# Patient Record
Sex: Female | Born: 1935 | Race: Black or African American | Hispanic: No | Smoking: Never smoker
Health system: Southern US, Community
[De-identification: ages and names within clinical notes are randomized; demographics above are authoritative.]

## PROBLEM LIST (undated history)

## (undated) DIAGNOSIS — E785 Hyperlipidemia, unspecified: Secondary | ICD-10-CM

## (undated) DIAGNOSIS — R229 Localized swelling, mass and lump, unspecified: Secondary | ICD-10-CM

## (undated) DIAGNOSIS — I5042 Chronic combined systolic (congestive) and diastolic (congestive) heart failure: Secondary | ICD-10-CM

## (undated) DIAGNOSIS — D649 Anemia, unspecified: Secondary | ICD-10-CM

## (undated) DIAGNOSIS — I2510 Atherosclerotic heart disease of native coronary artery without angina pectoris: Secondary | ICD-10-CM

## (undated) DIAGNOSIS — Z9289 Personal history of other medical treatment: Secondary | ICD-10-CM

## (undated) DIAGNOSIS — K279 Peptic ulcer, site unspecified, unspecified as acute or chronic, without hemorrhage or perforation: Secondary | ICD-10-CM

## (undated) DIAGNOSIS — E46 Unspecified protein-calorie malnutrition: Secondary | ICD-10-CM

## (undated) DIAGNOSIS — I10 Essential (primary) hypertension: Secondary | ICD-10-CM

## (undated) DIAGNOSIS — I5032 Chronic diastolic (congestive) heart failure: Principal | ICD-10-CM

## (undated) DIAGNOSIS — K219 Gastro-esophageal reflux disease without esophagitis: Secondary | ICD-10-CM

## (undated) DIAGNOSIS — B379 Candidiasis, unspecified: Secondary | ICD-10-CM

## (undated) DIAGNOSIS — I495 Sick sinus syndrome: Secondary | ICD-10-CM

## (undated) DIAGNOSIS — R001 Bradycardia, unspecified: Secondary | ICD-10-CM

## (undated) DIAGNOSIS — E119 Type 2 diabetes mellitus without complications: Secondary | ICD-10-CM

## (undated) DIAGNOSIS — J90 Pleural effusion, not elsewhere classified: Secondary | ICD-10-CM

## (undated) DIAGNOSIS — N186 End stage renal disease: Secondary | ICD-10-CM

## (undated) DIAGNOSIS — J45909 Unspecified asthma, uncomplicated: Secondary | ICD-10-CM

## (undated) DIAGNOSIS — I340 Nonrheumatic mitral (valve) insufficiency: Secondary | ICD-10-CM

## (undated) HISTORY — DX: Atherosclerotic heart disease of native coronary artery without angina pectoris: I25.10

## (undated) HISTORY — DX: Sick sinus syndrome: I49.5

## (undated) HISTORY — DX: Chronic combined systolic (congestive) and diastolic (congestive) heart failure: I50.42

## (undated) HISTORY — DX: Personal history of other medical treatment: Z92.89

## (undated) HISTORY — DX: Gastro-esophageal reflux disease without esophagitis: K21.9

## (undated) HISTORY — PX: COLON SURGERY: SHX602

## (undated) HISTORY — DX: Nonrheumatic mitral (valve) insufficiency: I34.0

## (undated) HISTORY — PX: ABDOMINAL HYSTERECTOMY: SHX81

## (undated) HISTORY — DX: Unspecified protein-calorie malnutrition: E46

## (undated) HISTORY — DX: Candidiasis, unspecified: B37.9

## (undated) HISTORY — DX: Anemia, unspecified: D64.9

## (undated) HISTORY — DX: Chronic diastolic (congestive) heart failure: I50.32

## (undated) HISTORY — DX: Pleural effusion, not elsewhere classified: J90

## (undated) HISTORY — DX: End stage renal disease: N18.6

## (undated) HISTORY — DX: Bradycardia, unspecified: R00.1

---

## 2008-09-19 ENCOUNTER — Emergency Department (HOSPITAL_BASED_OUTPATIENT_CLINIC_OR_DEPARTMENT_OTHER): Admission: EM | Admit: 2008-09-19 | Discharge: 2008-09-20 | Payer: Self-pay | Admitting: Emergency Medicine

## 2008-09-19 ENCOUNTER — Ambulatory Visit: Payer: Self-pay | Admitting: Diagnostic Radiology

## 2009-06-29 ENCOUNTER — Emergency Department (HOSPITAL_BASED_OUTPATIENT_CLINIC_OR_DEPARTMENT_OTHER): Admission: EM | Admit: 2009-06-29 | Discharge: 2009-06-29 | Payer: Self-pay | Admitting: Emergency Medicine

## 2009-06-29 ENCOUNTER — Ambulatory Visit: Payer: Self-pay | Admitting: Diagnostic Radiology

## 2010-10-07 LAB — DIFFERENTIAL
Basophils Absolute: 0 10*3/uL (ref 0.0–0.1)
Eosinophils Relative: 1 % (ref 0–5)
Lymphocytes Relative: 30 % (ref 12–46)
Lymphs Abs: 2.2 10*3/uL (ref 0.7–4.0)
Monocytes Absolute: 0.5 10*3/uL (ref 0.1–1.0)
Monocytes Relative: 7 % (ref 3–12)
Neutro Abs: 4.4 10*3/uL (ref 1.7–7.7)

## 2010-10-07 LAB — CBC
HCT: 35.4 % — ABNORMAL LOW (ref 36.0–46.0)
Hemoglobin: 11.8 g/dL — ABNORMAL LOW (ref 12.0–15.0)
RBC: 3.98 MIL/uL (ref 3.87–5.11)
WBC: 7.2 10*3/uL (ref 4.0–10.5)

## 2010-10-07 LAB — BASIC METABOLIC PANEL
BUN: 15 mg/dL (ref 6–23)
Chloride: 101 mEq/L (ref 96–112)
GFR calc non Af Amer: 54 mL/min — ABNORMAL LOW (ref >60)
Potassium: 3.5 mEq/L (ref 3.5–5.1)
Sodium: 142 mEq/L (ref 135–145)

## 2010-10-07 LAB — GLUCOSE, CAPILLARY: Glucose-Capillary: 198 mg/dL — ABNORMAL HIGH (ref 70–99)

## 2010-10-17 LAB — URINALYSIS, ROUTINE W REFLEX MICROSCOPIC
Bilirubin Urine: NEGATIVE
Glucose, UA: NEGATIVE mg/dL
Ketones, ur: NEGATIVE mg/dL
Nitrite: NEGATIVE
Specific Gravity, Urine: 1.01 (ref 1.005–1.030)
pH: 6.5 (ref 5.0–8.0)

## 2010-10-17 LAB — URINE CULTURE: Colony Count: 2000

## 2010-10-17 LAB — DIFFERENTIAL
Basophils Absolute: 0 10*3/uL (ref 0.0–0.1)
Lymphocytes Relative: 37 % (ref 12–46)
Lymphs Abs: 2.5 10*3/uL (ref 0.7–4.0)
Neutro Abs: 3.9 10*3/uL (ref 1.7–7.7)

## 2010-10-17 LAB — BASIC METABOLIC PANEL
BUN: 22 mg/dL (ref 6–23)
Chloride: 100 mEq/L (ref 96–112)
Creatinine, Ser: 0.9 mg/dL (ref 0.4–1.2)
GFR calc non Af Amer: >60 mL/min (ref >60)
Potassium: 4.6 mEq/L (ref 3.5–5.1)

## 2010-10-17 LAB — URINE MICROSCOPIC-ADD ON

## 2010-10-17 LAB — CBC
Platelets: 224 10*3/uL (ref 150–400)
RDW: 14 % (ref 11.5–15.5)
WBC: 6.8 10*3/uL (ref 4.0–10.5)

## 2012-07-09 ENCOUNTER — Emergency Department: Payer: Self-pay | Admitting: Emergency Medicine

## 2012-07-09 LAB — URINALYSIS, COMPLETE
Nitrite: NEGATIVE
Protein: 30
RBC,UR: 1 /HPF (ref 0–5)
Specific Gravity: 1.01 (ref 1.003–1.030)
Squamous Epithelial: 2

## 2012-07-09 LAB — COMPREHENSIVE METABOLIC PANEL
Albumin: 3.5 g/dL (ref 3.4–5.0)
Alkaline Phosphatase: 126 U/L (ref 50–136)
Bilirubin,Total: 0.2 mg/dL (ref 0.2–1.0)
Chloride: 98 mmol/L (ref 98–107)
EGFR (Non-African Amer.): 23 — ABNORMAL LOW
Osmolality: 298 (ref 275–301)
SGOT(AST): 19 U/L (ref 15–37)

## 2012-07-09 LAB — CBC
HCT: 37.5 % (ref 35.0–47.0)
WBC: 7.4 10*3/uL (ref 3.6–11.0)

## 2012-11-22 ENCOUNTER — Encounter (HOSPITAL_BASED_OUTPATIENT_CLINIC_OR_DEPARTMENT_OTHER): Payer: Self-pay

## 2012-11-22 ENCOUNTER — Emergency Department (HOSPITAL_BASED_OUTPATIENT_CLINIC_OR_DEPARTMENT_OTHER): Payer: Medicare Other

## 2012-11-22 ENCOUNTER — Emergency Department (HOSPITAL_BASED_OUTPATIENT_CLINIC_OR_DEPARTMENT_OTHER)
Admission: EM | Admit: 2012-11-22 | Discharge: 2012-11-22 | Disposition: A | Discharge status: Home / Self Care | Payer: Medicare Other | Attending: Emergency Medicine | Admitting: Emergency Medicine

## 2012-11-22 DIAGNOSIS — K219 Gastro-esophageal reflux disease without esophagitis: Secondary | ICD-10-CM | POA: Insufficient documentation

## 2012-11-22 DIAGNOSIS — E785 Hyperlipidemia, unspecified: Secondary | ICD-10-CM | POA: Insufficient documentation

## 2012-11-22 DIAGNOSIS — I10 Essential (primary) hypertension: Secondary | ICD-10-CM | POA: Insufficient documentation

## 2012-11-22 DIAGNOSIS — Z8719 Personal history of other diseases of the digestive system: Secondary | ICD-10-CM | POA: Insufficient documentation

## 2012-11-22 DIAGNOSIS — R059 Cough, unspecified: Secondary | ICD-10-CM | POA: Insufficient documentation

## 2012-11-22 DIAGNOSIS — R05 Cough: Secondary | ICD-10-CM | POA: Insufficient documentation

## 2012-11-22 DIAGNOSIS — R072 Precordial pain: Secondary | ICD-10-CM | POA: Insufficient documentation

## 2012-11-22 DIAGNOSIS — E119 Type 2 diabetes mellitus without complications: Secondary | ICD-10-CM | POA: Insufficient documentation

## 2012-11-22 DIAGNOSIS — J45909 Unspecified asthma, uncomplicated: Secondary | ICD-10-CM | POA: Insufficient documentation

## 2012-11-22 DIAGNOSIS — Z87448 Personal history of other diseases of urinary system: Secondary | ICD-10-CM | POA: Insufficient documentation

## 2012-11-22 DIAGNOSIS — Z79899 Other long term (current) drug therapy: Secondary | ICD-10-CM | POA: Insufficient documentation

## 2012-11-22 DIAGNOSIS — R5381 Other malaise: Secondary | ICD-10-CM | POA: Insufficient documentation

## 2012-11-22 HISTORY — DX: Unspecified asthma, uncomplicated: J45.909

## 2012-11-22 HISTORY — DX: Peptic ulcer, site unspecified, unspecified as acute or chronic, without hemorrhage or perforation: K27.9

## 2012-11-22 HISTORY — DX: Hyperlipidemia, unspecified: E78.5

## 2012-11-22 HISTORY — DX: Localized swelling, mass and lump, unspecified: R22.9

## 2012-11-22 LAB — COMPREHENSIVE METABOLIC PANEL
Albumin: 3.7 g/dL (ref 3.5–5.2)
BUN: 36 mg/dL — ABNORMAL HIGH (ref 6–23)
Creatinine, Ser: 1.7 mg/dL — ABNORMAL HIGH (ref 0.50–1.10)
Potassium: 4 mEq/L (ref 3.5–5.1)
Total Protein: 7.4 g/dL (ref 6.0–8.3)

## 2012-11-22 LAB — URINALYSIS, ROUTINE W REFLEX MICROSCOPIC
Glucose, UA: NEGATIVE mg/dL
pH: 5.5 (ref 5.0–8.0)

## 2012-11-22 LAB — CBC WITH DIFFERENTIAL/PLATELET
Eosinophils Relative: 4 % (ref 0–5)
HCT: 28.6 % — ABNORMAL LOW (ref 36.0–46.0)
Hemoglobin: 9.5 g/dL — ABNORMAL LOW (ref 12.0–15.0)
Lymphocytes Relative: 34 % (ref 12–46)
Lymphs Abs: 2.9 10*3/uL (ref 0.7–4.0)
MCH: 29.5 pg (ref 26.0–34.0)
MCV: 88.8 fL (ref 78.0–100.0)
Monocytes Absolute: 0.6 10*3/uL (ref 0.1–1.0)
Monocytes Relative: 7 % (ref 3–12)
Platelets: 221 10*3/uL (ref 150–400)
RBC: 3.22 MIL/uL — ABNORMAL LOW (ref 3.87–5.11)
WBC: 8.6 10*3/uL (ref 4.0–10.5)

## 2012-11-22 LAB — APTT: aPTT: 21 seconds — ABNORMAL LOW (ref 24–37)

## 2012-11-22 LAB — BASIC METABOLIC PANEL
BUN: 35 mg/dL — ABNORMAL HIGH (ref 6–23)
CO2: 25 mEq/L (ref 19–32)
Calcium: 9.4 mg/dL (ref 8.4–10.5)
Glucose, Bld: 255 mg/dL — ABNORMAL HIGH (ref 70–99)
Sodium: 137 mEq/L (ref 135–145)

## 2012-11-22 LAB — PROTIME-INR
INR: 0.92 (ref 0.00–1.49)
Prothrombin Time: 12.3 seconds (ref 11.6–15.2)

## 2012-11-22 LAB — LIPASE, BLOOD: Lipase: 68 U/L — ABNORMAL HIGH (ref 11–59)

## 2012-11-22 NOTE — ED Notes (Signed)
Pt states that she was hospitalized at [facility] in the past week, was discharged yesterday, was given two units of PRBC there for bleeding ulcers.  Pt states that last night she had onset of chest pain, dull pressure in central chest.  Pt states that pain is constant, denies diaphroesis, sob, + for cough which is occasionally productive.  HX of asthma.  C/o generalized weakness.

## 2012-11-22 NOTE — Discharge Instructions (Signed)
Monica [name], you had physical examination, laboratory tests, EKG, and chest x-ray to check on you for chest pain that started last night. Fortunately, all of your tests were good.  It is safe to go home. Continue to take Protonix once a day to reduce the amount of acid in your stomach, and rest with the head propped up, so acid from your stomach does not go up into your chest.

## 2012-11-22 NOTE — ED Notes (Signed)
MD at bedside. 

## 2012-11-22 NOTE — ED Provider Notes (Signed)
History  This chart was scribed for Monica Latina III, MD by Mikel Cella, ED Scribe. This patient was seen in room MH12/MH12 and the patient's care was started at 1711.  CSN: [id number]  Arrival date & time 11/22/12  1554   None     Chief Complaint  Patient presents with  . Chest Pain  . Weakness  . Shortness of Breath     The history is provided by the patient. No language interpreter was used.    HPI Comments: Monica [name] is a 79 y.o. female with a h/o HTN, DM and renal disorder who presents to the Emergency Department complaining of gradual onset, unchanging, constant CP with associated generalized weakness and intermittently productive cough that began last night. Pt states she was hospitalized at [facility] for bleeding ulcers. She states she was discharged yesterday. She describes the pain as a dull pressure located in her central chest. She reports taking an allergy pill with intermittent relief. She denies any nausea, emesis, diarrhea, diaphoresis, SOB and fever as associated symptoms.    Past Medical History  Diagnosis Date  . Peptic ulcer   . Mass   . Hypertension   . Hyperlipidemia   . Diabetes mellitus without complication   . Asthma   . Blood transfusion without reported diagnosis   . Renal disorder     Past Surgical History  Procedure Laterality Date  . Abdominal hysterectomy      History reviewed. No pertinent family history.  History  Substance Use Topics  . Smoking status: Never Smoker   . Smokeless tobacco: Never Used  . Alcohol Use: No   No OB history available.   Review of Systems  Constitutional: Negative for diaphoresis.  Respiratory: Positive for cough. Negative for shortness of breath.   Cardiovascular: Positive for chest pain.  Neurological: Positive for weakness.       Generalized weakness  All other systems reviewed and are negative.    Allergies  Review of patient's allergies indicates no known allergies.  Home Medications    Current Outpatient Rx  Name  Route  Sig  Dispense  Refill  . amLODipine (NORVASC) 10 MG tablet   Oral   Take 10 mg by mouth daily.         Marland Kitchen atenolol-chlorthalidone (TENORETIC) 50-25 MG per tablet   Oral   Take 1 tablet by mouth daily.         Marland Kitchen atorvastatin (LIPITOR) 80 MG tablet   Oral   Take 80 mg by mouth daily.         . diphenhydrAMINE (BENADRYL) 25 MG tablet   Oral   Take 25 mg by mouth 2 (two) times daily.         Marland Kitchen glimepiride (AMARYL) 4 MG tablet   Oral   Take 8 mg by mouth daily before breakfast.         . IRON CR PO   Oral   Take 65 mg by mouth 2 (two) times daily.         Marland Kitchen losartan (COZAAR) 50 MG tablet   Oral   Take 50 mg by mouth daily.         . pantoprazole (PROTONIX) 20 MG tablet   Oral   Take 40 mg by mouth daily.         . rosiglitazone (AVANDIA) 8 MG tablet   Oral   Take 8 mg by mouth daily.           Triage  Vitals: BP 187/62  Pulse 66  Temp(Src) 98.5 F (36.9 C) (Oral)  Resp 20  Ht 5\' 2"  (1.575 m)  Wt 182 lb (82.555 kg)  BMI 33.28 kg/m2  SpO2 99%  Physical Exam  Nursing note and vitals reviewed. Constitutional: She is oriented to person, place, and time. She appears well-developed and well-nourished. No distress.  HENT:  Head: Normocephalic and atraumatic.  Right Ear: External ear normal.  Left Ear: External ear normal.  Mouth/Throat: Oropharynx is clear and moist. No oropharyngeal exudate.  TMs normal bilaterally  Eyes: Conjunctivae and EOM are normal. Pupils are equal, round, and reactive to light.  Neck: Normal range of motion. Neck supple. No tracheal deviation present.  Cardiovascular: Normal rate, regular rhythm and normal heart sounds.  Exam reveals no gallop and no friction rub.   No murmur heard. Pulmonary/Chest: Effort normal and breath sounds normal. No respiratory distress. She has no wheezes. She has no rales. She exhibits no tenderness.  Abdominal: Soft. Bowel sounds are normal. She exhibits no  distension and no mass. There is no tenderness. There is no rebound and no guarding.  Localizes pain to substernal region. No deformity or tenderness noted   Musculoskeletal: Normal range of motion. She exhibits no edema.  Neurological: She is alert and oriented to person, place, and time.  Skin: Skin is warm and dry. She is not diaphoretic.  Psychiatric: She has a normal mood and affect. Her behavior is normal.    ED Course  Procedures (including critical care time)  DIAGNOSTIC STUDIES: Oxygen Saturation is 99% on room air, normal by my interpretation.    COORDINATION OF CARE:  5:11 PM  Date: 11/22/2012  Rate: 67  Rhythm: normal sinus rhythm  QRS Axis: normal  Intervals: normal  ST/T Wave abnormalities: normal  Conduction Disutrbances:none  Narrative Interpretation: Normal EKG  Old EKG Reviewed: none available and unchanged  5:38 PM-Discussed treatment plan which includes CBC, BMP, Troponin, EKG and CXRwith pt at bedside and pt agreed to plan.   6:45 PM: Pt rechecked, lab results and discharge discussed, she seems normal and comfortable    Results for orders placed during the hospital encounter of 11/22/12  CBC WITH DIFFERENTIAL      Result Value Range   WBC 8.6  4.0 - 10.5 K/uL   RBC 3.22 (*) 3.87 - 5.11 MIL/uL   Hemoglobin 9.5 (*) 12.0 - 15.0 g/dL   HCT 28.6 (*) 36.0 - 46.0 %   MCV 88.8  78.0 - 100.0 fL   MCH 29.5  26.0 - 34.0 pg   MCHC 33.2  30.0 - 36.0 g/dL   RDW 13.8  11.5 - 15.5 %   Platelets 221  150 - 400 K/uL   Neutrophils Relative % 55  43 - 77 %   Neutro Abs 4.8  1.7 - 7.7 K/uL   Lymphocytes Relative 34  12 - 46 %   Lymphs Abs 2.9  0.7 - 4.0 K/uL   Monocytes Relative 7  3 - 12 %   Monocytes Absolute 0.6  0.1 - 1.0 K/uL   Eosinophils Relative 4  0 - 5 %   Eosinophils Absolute 0.4  0.0 - 0.7 K/uL   Basophils Relative 0  0 - 1 %   Basophils Absolute 0.0  0.0 - 0.1 K/uL  BASIC METABOLIC PANEL      Result Value Range   Sodium 137  135 - 145 mEq/L    Potassium 4.0  3.5 - 5.1 mEq/L  Chloride 99  96 - 112 mEq/L   CO2 25  19 - 32 mEq/L   Glucose, Bld 255 (*) 70 - 99 mg/dL   BUN 35 (*) 6 - 23 mg/dL   Creatinine, Ser 1.70 (*) 0.50 - 1.10 mg/dL   Calcium 9.4  8.4 - 10.5 mg/dL   GFR calc non Af Amer 27 (*) >90 mL/min   GFR calc Af Amer 32 (*) >90 mL/min  TROPONIN I      Result Value Range   Troponin I <0.30  <0.30 ng/mL  COMPREHENSIVE METABOLIC PANEL      Result Value Range   Sodium 137  135 - 145 mEq/L   Potassium 4.0  3.5 - 5.1 mEq/L   Chloride 99  96 - 112 mEq/L   CO2 23  19 - 32 mEq/L   Glucose, Bld 261 (*) 70 - 99 mg/dL   BUN 36 (*) 6 - 23 mg/dL   Creatinine, Ser 1.70 (*) 0.50 - 1.10 mg/dL   Calcium 9.5  8.4 - 10.5 mg/dL   Total Protein 7.4  6.0 - 8.3 g/dL   Albumin 3.7  3.5 - 5.2 g/dL   AST 22  0 - 37 U/L   ALT 20  0 - 35 U/L   Alkaline Phosphatase 58  39 - 117 U/L   Total Bilirubin 0.2 (*) 0.3 - 1.2 mg/dL   GFR calc non Af Amer 27 (*) >90 mL/min   GFR calc Af Amer 32 (*) >90 mL/min  URINALYSIS, ROUTINE W REFLEX MICROSCOPIC      Result Value Range   Color, Urine YELLOW  YELLOW   APPearance CLEAR  CLEAR   Specific Gravity, Urine 1.013  1.005 - 1.030   pH 5.5  5.0 - 8.0   Glucose, UA NEGATIVE  NEGATIVE mg/dL   Hgb urine dipstick NEGATIVE  NEGATIVE   Bilirubin Urine NEGATIVE  NEGATIVE   Ketones, ur NEGATIVE  NEGATIVE mg/dL   Protein, ur NEGATIVE  NEGATIVE mg/dL   Urobilinogen, UA 0.2  0.0 - 1.0 mg/dL   Nitrite NEGATIVE  NEGATIVE   Leukocytes, UA TRACE (*) NEGATIVE  PROTIME-INR      Result Value Range   Prothrombin Time 12.3  11.6 - 15.2 seconds   INR 0.92  0.00 - 1.49  APTT      Result Value Range   aPTT 21 (*) 24 - 37 seconds  LIPASE, BLOOD      Result Value Range   Lipase 68 (*) 11 - 59 U/L  URINE MICROSCOPIC-ADD ON      Result Value Range   Squamous Epithelial / LPF RARE  RARE   WBC, UA 0-2  <3 WBC/hpf   RBC / HPF 0-2  <3 RBC/hpf   Bacteria, UA FEW (*) RARE   Urine-Other MUCOUS PRESENT     Dg Chest 2  View  11/22/2012   *RADIOLOGY REPORT*  Clinical Data: Chest pain  CHEST - 2 VIEW  Comparison: June 29, 2009.  Findings: Stable cardiomediastinal silhouette.  No acute pulmonary disease is noted.  Moderate thoracic kyphosis is noted.  No pleural effusion or pneumothorax is noted.  IMPRESSION: No acute cardiopulmonary abnormality seen.   Original Report Authenticated By: Marijo Conception.,  M.D.      1. GERD (gastroesophageal reflux disease)      I personally performed the services described in this documentation, which was scribed in my presence. The recorded information has been reviewed and is accurate.  Katy Apo, MD  Monica Latina III, MD 11/22/12 [phone]

## 2014-07-25 ENCOUNTER — Ambulatory Visit (INDEPENDENT_AMBULATORY_CARE_PROVIDER_SITE_OTHER): Payer: Medicare HMO | Admitting: Podiatry

## 2014-07-25 ENCOUNTER — Encounter: Payer: Self-pay | Admitting: Podiatry

## 2014-07-25 VITALS — BP 150/71 | HR 77 | Ht 62.0 in | Wt 191.0 lb

## 2014-07-25 DIAGNOSIS — M7740 Metatarsalgia, unspecified foot: Secondary | ICD-10-CM | POA: Insufficient documentation

## 2014-07-25 DIAGNOSIS — M216X9 Other acquired deformities of unspecified foot: Secondary | ICD-10-CM | POA: Insufficient documentation

## 2014-07-25 DIAGNOSIS — B351 Tinea unguium: Secondary | ICD-10-CM

## 2014-07-25 DIAGNOSIS — M79606 Pain in leg, unspecified: Secondary | ICD-10-CM | POA: Insufficient documentation

## 2014-07-25 DIAGNOSIS — M7741 Metatarsalgia, right foot: Secondary | ICD-10-CM

## 2014-07-25 DIAGNOSIS — M7742 Metatarsalgia, left foot: Secondary | ICD-10-CM

## 2014-07-25 NOTE — Patient Instructions (Signed)
Seen for pain in both feet. Noted of tight achilles tendon bilateral. Need to do stretch exercise daily.  May benefit from Diabetic shoes

## 2014-07-25 NOTE — Progress Notes (Signed)
Subjective: 81 year old diabetic patient presents complaining of pain in side of both feet for off and on about 6 months. Hurts after been on feet. Her last diabetic shoes were made in 2012. Blood sugar was 180 this morning.   Objective: Hallux valgus with bunion bilateral. Pedal pulses are not palpable bilateral both PT and DP. Tight Achilles tendon bilateral. Bilateral ankle edema. No abnormal skin lesions.  Assessment: Lesser metatarsalgia bilateral. Forefoot varus bilateral. Ankle equinus bilateral. Lateral weight shifting bilateral.  Plan: Reviewed findings and stretch exercise. All nails debrided. May benefit from Diabetic shoes.

## 2015-04-24 DIAGNOSIS — E1165 Type 2 diabetes mellitus with hyperglycemia: Secondary | ICD-10-CM | POA: Insufficient documentation

## 2015-04-24 DIAGNOSIS — I10 Essential (primary) hypertension: Secondary | ICD-10-CM | POA: Insufficient documentation

## 2015-04-24 DIAGNOSIS — N186 End stage renal disease: Secondary | ICD-10-CM | POA: Insufficient documentation

## 2015-04-27 DIAGNOSIS — E1169 Type 2 diabetes mellitus with other specified complication: Secondary | ICD-10-CM | POA: Insufficient documentation

## 2015-04-27 DIAGNOSIS — E559 Vitamin D deficiency, unspecified: Secondary | ICD-10-CM | POA: Insufficient documentation

## 2015-04-27 DIAGNOSIS — E785 Hyperlipidemia, unspecified: Secondary | ICD-10-CM

## 2015-04-27 DIAGNOSIS — J45909 Unspecified asthma, uncomplicated: Secondary | ICD-10-CM | POA: Diagnosis present

## 2015-07-18 DIAGNOSIS — E1169 Type 2 diabetes mellitus with other specified complication: Secondary | ICD-10-CM | POA: Diagnosis not present

## 2015-07-18 DIAGNOSIS — Z6833 Body mass index (BMI) 33.0-33.9, adult: Secondary | ICD-10-CM | POA: Diagnosis not present

## 2015-07-18 DIAGNOSIS — Z794 Long term (current) use of insulin: Secondary | ICD-10-CM | POA: Diagnosis not present

## 2015-07-18 DIAGNOSIS — E1165 Type 2 diabetes mellitus with hyperglycemia: Secondary | ICD-10-CM | POA: Diagnosis not present

## 2015-07-18 DIAGNOSIS — E785 Hyperlipidemia, unspecified: Secondary | ICD-10-CM | POA: Diagnosis not present

## 2015-07-31 DIAGNOSIS — H401111 Primary open-angle glaucoma, right eye, mild stage: Secondary | ICD-10-CM | POA: Diagnosis not present

## 2015-07-31 DIAGNOSIS — H401122 Primary open-angle glaucoma, left eye, moderate stage: Secondary | ICD-10-CM | POA: Diagnosis not present

## 2015-08-02 DIAGNOSIS — E66811 Obesity, class 1: Secondary | ICD-10-CM | POA: Insufficient documentation

## 2015-08-02 DIAGNOSIS — E785 Hyperlipidemia, unspecified: Secondary | ICD-10-CM | POA: Diagnosis not present

## 2015-08-02 DIAGNOSIS — E1165 Type 2 diabetes mellitus with hyperglycemia: Secondary | ICD-10-CM | POA: Diagnosis not present

## 2015-08-02 DIAGNOSIS — E669 Obesity, unspecified: Secondary | ICD-10-CM | POA: Insufficient documentation

## 2015-08-02 DIAGNOSIS — E559 Vitamin D deficiency, unspecified: Secondary | ICD-10-CM | POA: Diagnosis not present

## 2015-08-02 DIAGNOSIS — Z794 Long term (current) use of insulin: Secondary | ICD-10-CM | POA: Diagnosis not present

## 2015-08-02 DIAGNOSIS — E1169 Type 2 diabetes mellitus with other specified complication: Secondary | ICD-10-CM | POA: Diagnosis not present

## 2015-08-02 DIAGNOSIS — I10 Essential (primary) hypertension: Secondary | ICD-10-CM | POA: Diagnosis not present

## 2015-08-02 DIAGNOSIS — N184 Chronic kidney disease, stage 4 (severe): Secondary | ICD-10-CM | POA: Diagnosis not present

## 2015-08-08 DIAGNOSIS — E1165 Type 2 diabetes mellitus with hyperglycemia: Secondary | ICD-10-CM | POA: Diagnosis not present

## 2015-08-08 DIAGNOSIS — N184 Chronic kidney disease, stage 4 (severe): Secondary | ICD-10-CM | POA: Diagnosis not present

## 2015-08-08 DIAGNOSIS — Z6834 Body mass index (BMI) 34.0-34.9, adult: Secondary | ICD-10-CM | POA: Diagnosis not present

## 2015-08-14 DIAGNOSIS — H401122 Primary open-angle glaucoma, left eye, moderate stage: Secondary | ICD-10-CM | POA: Diagnosis not present

## 2015-08-14 DIAGNOSIS — H401111 Primary open-angle glaucoma, right eye, mild stage: Secondary | ICD-10-CM | POA: Diagnosis not present

## 2015-08-20 DIAGNOSIS — J45909 Unspecified asthma, uncomplicated: Secondary | ICD-10-CM | POA: Diagnosis not present

## 2015-08-20 DIAGNOSIS — R1013 Epigastric pain: Secondary | ICD-10-CM | POA: Diagnosis not present

## 2015-08-20 DIAGNOSIS — Z0000 Encounter for general adult medical examination without abnormal findings: Secondary | ICD-10-CM | POA: Diagnosis not present

## 2015-08-20 DIAGNOSIS — K254 Chronic or unspecified gastric ulcer with hemorrhage: Secondary | ICD-10-CM | POA: Diagnosis not present

## 2015-08-20 DIAGNOSIS — E1151 Type 2 diabetes mellitus with diabetic peripheral angiopathy without gangrene: Secondary | ICD-10-CM | POA: Diagnosis not present

## 2015-08-20 DIAGNOSIS — E559 Vitamin D deficiency, unspecified: Secondary | ICD-10-CM | POA: Diagnosis not present

## 2015-08-20 DIAGNOSIS — N183 Chronic kidney disease, stage 3 (moderate): Secondary | ICD-10-CM | POA: Diagnosis not present

## 2015-08-20 DIAGNOSIS — I119 Hypertensive heart disease without heart failure: Secondary | ICD-10-CM | POA: Diagnosis not present

## 2015-08-20 DIAGNOSIS — E785 Hyperlipidemia, unspecified: Secondary | ICD-10-CM | POA: Diagnosis not present

## 2015-08-20 DIAGNOSIS — I10 Essential (primary) hypertension: Secondary | ICD-10-CM | POA: Diagnosis not present

## 2015-09-14 ENCOUNTER — Emergency Department (HOSPITAL_BASED_OUTPATIENT_CLINIC_OR_DEPARTMENT_OTHER): Payer: Medicare HMO

## 2015-09-14 ENCOUNTER — Encounter (HOSPITAL_BASED_OUTPATIENT_CLINIC_OR_DEPARTMENT_OTHER): Payer: Self-pay | Admitting: *Deleted

## 2015-09-14 ENCOUNTER — Inpatient Hospital Stay (HOSPITAL_BASED_OUTPATIENT_CLINIC_OR_DEPARTMENT_OTHER)
Admission: EM | Admit: 2015-09-14 | Discharge: 2015-10-03 | DRG: 233 | Disposition: A | Discharge status: Skilled Nursing Facility | Payer: Medicare HMO | Attending: Cardiothoracic Surgery | Admitting: Cardiothoracic Surgery

## 2015-09-14 DIAGNOSIS — E1129 Type 2 diabetes mellitus with other diabetic kidney complication: Secondary | ICD-10-CM | POA: Diagnosis not present

## 2015-09-14 DIAGNOSIS — J4520 Mild intermittent asthma, uncomplicated: Secondary | ICD-10-CM

## 2015-09-14 DIAGNOSIS — E785 Hyperlipidemia, unspecified: Secondary | ICD-10-CM | POA: Diagnosis present

## 2015-09-14 DIAGNOSIS — R52 Pain, unspecified: Secondary | ICD-10-CM | POA: Diagnosis not present

## 2015-09-14 DIAGNOSIS — R488 Other symbolic dysfunctions: Secondary | ICD-10-CM | POA: Diagnosis not present

## 2015-09-14 DIAGNOSIS — E669 Obesity, unspecified: Secondary | ICD-10-CM | POA: Diagnosis present

## 2015-09-14 DIAGNOSIS — E43 Unspecified severe protein-calorie malnutrition: Secondary | ICD-10-CM | POA: Diagnosis not present

## 2015-09-14 DIAGNOSIS — Z87891 Personal history of nicotine dependence: Secondary | ICD-10-CM

## 2015-09-14 DIAGNOSIS — I5032 Chronic diastolic (congestive) heart failure: Secondary | ICD-10-CM | POA: Diagnosis present

## 2015-09-14 DIAGNOSIS — I472 Ventricular tachycardia: Secondary | ICD-10-CM | POA: Diagnosis present

## 2015-09-14 DIAGNOSIS — N179 Acute kidney failure, unspecified: Secondary | ICD-10-CM | POA: Diagnosis not present

## 2015-09-14 DIAGNOSIS — I509 Heart failure, unspecified: Secondary | ICD-10-CM | POA: Diagnosis not present

## 2015-09-14 DIAGNOSIS — Z6834 Body mass index (BMI) 34.0-34.9, adult: Secondary | ICD-10-CM

## 2015-09-14 DIAGNOSIS — Z9689 Presence of other specified functional implants: Secondary | ICD-10-CM

## 2015-09-14 DIAGNOSIS — M109 Gout, unspecified: Secondary | ICD-10-CM | POA: Diagnosis present

## 2015-09-14 DIAGNOSIS — Z4659 Encounter for fitting and adjustment of other gastrointestinal appliance and device: Secondary | ICD-10-CM

## 2015-09-14 DIAGNOSIS — Z8711 Personal history of peptic ulcer disease: Secondary | ICD-10-CM

## 2015-09-14 DIAGNOSIS — Z794 Long term (current) use of insulin: Secondary | ICD-10-CM | POA: Diagnosis not present

## 2015-09-14 DIAGNOSIS — E119 Type 2 diabetes mellitus without complications: Secondary | ICD-10-CM

## 2015-09-14 DIAGNOSIS — I10 Essential (primary) hypertension: Secondary | ICD-10-CM

## 2015-09-14 DIAGNOSIS — N183 Chronic kidney disease, stage 3 unspecified: Secondary | ICD-10-CM

## 2015-09-14 DIAGNOSIS — I25110 Atherosclerotic heart disease of native coronary artery with unstable angina pectoris: Secondary | ICD-10-CM | POA: Diagnosis present

## 2015-09-14 DIAGNOSIS — R410 Disorientation, unspecified: Secondary | ICD-10-CM | POA: Diagnosis not present

## 2015-09-14 DIAGNOSIS — I209 Angina pectoris, unspecified: Secondary | ICD-10-CM | POA: Insufficient documentation

## 2015-09-14 DIAGNOSIS — J45909 Unspecified asthma, uncomplicated: Secondary | ICD-10-CM | POA: Diagnosis present

## 2015-09-14 DIAGNOSIS — I200 Unstable angina: Secondary | ICD-10-CM | POA: Diagnosis present

## 2015-09-14 DIAGNOSIS — I129 Hypertensive chronic kidney disease with stage 1 through stage 4 chronic kidney disease, or unspecified chronic kidney disease: Secondary | ICD-10-CM | POA: Diagnosis not present

## 2015-09-14 DIAGNOSIS — N184 Chronic kidney disease, stage 4 (severe): Secondary | ICD-10-CM | POA: Diagnosis present

## 2015-09-14 DIAGNOSIS — R4182 Altered mental status, unspecified: Secondary | ICD-10-CM | POA: Insufficient documentation

## 2015-09-14 DIAGNOSIS — J9811 Atelectasis: Secondary | ICD-10-CM | POA: Diagnosis not present

## 2015-09-14 DIAGNOSIS — Z951 Presence of aortocoronary bypass graft: Secondary | ICD-10-CM

## 2015-09-14 DIAGNOSIS — Z8673 Personal history of transient ischemic attack (TIA), and cerebral infarction without residual deficits: Secondary | ICD-10-CM

## 2015-09-14 DIAGNOSIS — I160 Hypertensive urgency: Secondary | ICD-10-CM | POA: Diagnosis present

## 2015-09-14 DIAGNOSIS — R1312 Dysphagia, oropharyngeal phase: Secondary | ICD-10-CM | POA: Diagnosis not present

## 2015-09-14 DIAGNOSIS — Z452 Encounter for adjustment and management of vascular access device: Secondary | ICD-10-CM | POA: Diagnosis not present

## 2015-09-14 DIAGNOSIS — M6281 Muscle weakness (generalized): Secondary | ICD-10-CM | POA: Diagnosis not present

## 2015-09-14 DIAGNOSIS — G9341 Metabolic encephalopathy: Secondary | ICD-10-CM | POA: Diagnosis not present

## 2015-09-14 DIAGNOSIS — I130 Hypertensive heart and chronic kidney disease with heart failure and stage 1 through stage 4 chronic kidney disease, or unspecified chronic kidney disease: Secondary | ICD-10-CM | POA: Diagnosis present

## 2015-09-14 DIAGNOSIS — D62 Acute posthemorrhagic anemia: Secondary | ICD-10-CM | POA: Diagnosis not present

## 2015-09-14 DIAGNOSIS — R9431 Abnormal electrocardiogram [ECG] [EKG]: Secondary | ICD-10-CM | POA: Diagnosis not present

## 2015-09-14 DIAGNOSIS — J90 Pleural effusion, not elsewhere classified: Secondary | ICD-10-CM

## 2015-09-14 DIAGNOSIS — K219 Gastro-esophageal reflux disease without esophagitis: Secondary | ICD-10-CM | POA: Diagnosis present

## 2015-09-14 DIAGNOSIS — E870 Hyperosmolality and hypernatremia: Secondary | ICD-10-CM | POA: Diagnosis not present

## 2015-09-14 DIAGNOSIS — N170 Acute kidney failure with tubular necrosis: Secondary | ICD-10-CM | POA: Diagnosis not present

## 2015-09-14 DIAGNOSIS — E1122 Type 2 diabetes mellitus with diabetic chronic kidney disease: Secondary | ICD-10-CM | POA: Diagnosis present

## 2015-09-14 DIAGNOSIS — R0602 Shortness of breath: Secondary | ICD-10-CM | POA: Diagnosis not present

## 2015-09-14 DIAGNOSIS — R262 Difficulty in walking, not elsewhere classified: Secondary | ICD-10-CM | POA: Diagnosis not present

## 2015-09-14 DIAGNOSIS — I080 Rheumatic disorders of both mitral and aortic valves: Secondary | ICD-10-CM | POA: Diagnosis not present

## 2015-09-14 DIAGNOSIS — I2510 Atherosclerotic heart disease of native coronary artery without angina pectoris: Secondary | ICD-10-CM | POA: Diagnosis not present

## 2015-09-14 DIAGNOSIS — R079 Chest pain, unspecified: Secondary | ICD-10-CM | POA: Diagnosis present

## 2015-09-14 DIAGNOSIS — Z09 Encounter for follow-up examination after completed treatment for conditions other than malignant neoplasm: Secondary | ICD-10-CM

## 2015-09-14 DIAGNOSIS — J939 Pneumothorax, unspecified: Secondary | ICD-10-CM | POA: Diagnosis not present

## 2015-09-14 DIAGNOSIS — R339 Retention of urine, unspecified: Secondary | ICD-10-CM | POA: Diagnosis not present

## 2015-09-14 DIAGNOSIS — Z48812 Encounter for surgical aftercare following surgery on the circulatory system: Secondary | ICD-10-CM | POA: Diagnosis not present

## 2015-09-14 DIAGNOSIS — Z4682 Encounter for fitting and adjustment of non-vascular catheter: Secondary | ICD-10-CM | POA: Diagnosis not present

## 2015-09-14 DIAGNOSIS — J811 Chronic pulmonary edema: Secondary | ICD-10-CM | POA: Diagnosis not present

## 2015-09-14 DIAGNOSIS — I639 Cerebral infarction, unspecified: Secondary | ICD-10-CM

## 2015-09-14 DIAGNOSIS — M40204 Unspecified kyphosis, thoracic region: Secondary | ICD-10-CM | POA: Diagnosis present

## 2015-09-14 HISTORY — DX: Essential (primary) hypertension: I10

## 2015-09-14 HISTORY — DX: Personal history of other medical treatment: Z92.89

## 2015-09-14 HISTORY — DX: Type 2 diabetes mellitus without complications: E11.9

## 2015-09-14 LAB — URINE MICROSCOPIC-ADD ON

## 2015-09-14 LAB — BASIC METABOLIC PANEL
Anion gap: 12 (ref 5–15)
BUN: 33 mg/dL — AB (ref 6–20)
CHLORIDE: 101 mmol/L (ref 101–111)
CO2: 25 mmol/L (ref 22–32)
Calcium: 8.9 mg/dL (ref 8.9–10.3)
Creatinine, Ser: 1.55 mg/dL — ABNORMAL HIGH (ref 0.44–1.00)
GFR calc Af Amer: 35 mL/min — ABNORMAL LOW (ref >60)
GFR calc non Af Amer: 30 mL/min — ABNORMAL LOW (ref >60)
GLUCOSE: 180 mg/dL — AB (ref 65–99)
POTASSIUM: 3.8 mmol/L (ref 3.5–5.1)
Sodium: 138 mmol/L (ref 135–145)

## 2015-09-14 LAB — URINALYSIS, ROUTINE W REFLEX MICROSCOPIC
BILIRUBIN URINE: NEGATIVE
Glucose, UA: NEGATIVE mg/dL
Ketones, ur: NEGATIVE mg/dL
Leukocytes, UA: NEGATIVE
Nitrite: NEGATIVE
PH: 6.5 (ref 5.0–8.0)
Protein, ur: 100 mg/dL — AB
SPECIFIC GRAVITY, URINE: 1.007 (ref 1.005–1.030)

## 2015-09-14 LAB — CBC
HEMATOCRIT: 34 % — AB (ref 36.0–46.0)
Hemoglobin: 11.1 g/dL — ABNORMAL LOW (ref 12.0–15.0)
MCH: 28.8 pg (ref 26.0–34.0)
MCHC: 32.6 g/dL (ref 30.0–36.0)
MCV: 88.1 fL (ref 78.0–100.0)
Platelets: 239 10*3/uL (ref 150–400)
RBC: 3.86 MIL/uL — ABNORMAL LOW (ref 3.87–5.11)
RDW: 14.4 % (ref 11.5–15.5)
WBC: 8.4 10*3/uL (ref 4.0–10.5)

## 2015-09-14 LAB — TROPONIN I: Troponin I: <0.03 ng/mL (ref <0.031)

## 2015-09-14 MED ORDER — ASPIRIN 81 MG PO CHEW
162.0000 mg | CHEWABLE_TABLET | Freq: Once | ORAL | Status: AC
  Administered 2015-09-14: 162 mg via ORAL
  Filled 2015-09-14: qty 2

## 2015-09-14 MED ORDER — NITROGLYCERIN 2 % TD OINT
1.0000 [in_us] | TOPICAL_OINTMENT | Freq: Once | TRANSDERMAL | Status: AC
  Administered 2015-09-14: 1 [in_us] via TOPICAL
  Filled 2015-09-14: qty 1

## 2015-09-14 NOTE — ED Notes (Signed)
Pt reports stabbing chest pain approx 15 mins prior arrival. States she felt hot and pain comes and goes. Has had prior episodes of the same

## 2015-09-14 NOTE — ED Provider Notes (Signed)
Medical screening examination/treatment/procedure(s) were conducted as a shared visit with non-physician practitioner(s) and myself.  I personally evaluated the patient during the encounter.   EKG Interpretation   Date/Time:  Friday September 14 2015 21:35:43 EST Ventricular Rate:  93 PR Interval:  186 QRS Duration: 96 QT Interval:  374 QTC Calculation: 465 R Axis:   11 Text Interpretation:  Normal sinus rhythm Nonspecific ST and T wave  abnormality , new since last tracing Abnormal ECG Confirmed by Cory Rama   MD-J, Kessler Kopinski ([id number]) on 09/14/2015 9:41:08 PM      Pt presents to the ED with chest pain.  Non known history of ACS.  Heart score =6.  Pain free in the ED.  BP elevated.  Will give asa, NTG.  Will admit for serial enzymes, further evaluation.  Dorie Rank, MD 09/14/15 2352

## 2015-09-14 NOTE — ED Provider Notes (Signed)
CSN: [id number]     Arrival date & time 09/14/15  2128 History   First MD Initiated Contact with Patient 09/14/15 2144     Chief Complaint  Patient presents with  . Chest Pain     (Consider location/radiation/quality/duration/timing/severity/associated sxs/prior Treatment) HPI  Monica [name] is a(n) 82 y.o. female who presents To the emergency department with chief complaint of chest pain. 2. The past medical history peptic ulcer disease, hypertension, hyperlipidemia and diabetes. Her father died of sudden cardiac arrest at the age of 89 from suspected MI. No other family history. Patient states that she's had about a week of intermittent retrosternal stabbing, severe chest pain which lasts about 20 minutes at a time. Today she was with her daughters walking into the store. Her daughter stated that by the time she walked from the car to the store. She was bent over, clutching her chest, complaining of severe stabbing chest pain. Her daughter stated that she was short of breath. She did take her albuterol inhaler without relief. However, her symptoms resolved at rest. Her daughters had taken here to the emergency department. She denies any history of any cardiac disease. She has no pain at rest at this time. She took 1 baby aspirin this morning. She does have a history of reflux and peptic ulcer, but states that this feels different. Her daughter states that that does not appear to be an asthma attack. Is no current chest pain or shortness of breath.    Past Medical History  Diagnosis Date  . Peptic ulcer   . Mass   . Hypertension   . Hyperlipidemia   . Diabetes mellitus without complication ([place])   . Asthma   . Blood transfusion without reported diagnosis   . Renal disorder    Past Surgical History  Procedure Laterality Date  . Abdominal hysterectomy    . Colon surgery     No family history on file. Social History  Substance Use Topics  . Smoking status: Never Smoker   . Smokeless  tobacco: Former Systems developer    Types: Snuff  . Alcohol Use: No   OB History    No data available     Review of Systems   Ten systems reviewed and are negative for acute change, except as noted in the HPI.   Allergies  Review of patient's allergies indicates no known allergies.  Home Medications   Prior to Admission medications   Medication Sig Start Date End Date Taking? Authorizing Provider  amLODipine (NORVASC) 10 MG tablet Take 10 mg by mouth daily.   Yes Historical Provider, MD  atorvastatin (LIPITOR) 80 MG tablet Take 80 mg by mouth daily.   Yes Historical Provider, MD  diphenhydrAMINE (BENADRYL) 25 MG tablet Take 25 mg by mouth as needed.    Yes Historical Provider, MD  furosemide (LASIX) 20 MG tablet  05/17/14  Yes Historical Provider, MD  insulin glargine (LANTUS) 100 UNIT/ML injection Inject 100 Units into the skin 2 (two) times daily.   Yes Historical Provider, MD  insulin lispro (HUMALOG) 100 UNIT/ML cartridge Inject into the skin 3 (three) times daily between meals as needed.   Yes Historical Provider, MD  IRON CR PO Take 65 mg by mouth 2 (two) times daily.   Yes Historical Provider, MD  allopurinol (ZYLOPRIM) 100 MG tablet  07/09/14   Historical Provider, MD  atenolol-chlorthalidone (TENORETIC) 50-25 MG per tablet Take 1 tablet by mouth daily.    Historical Provider, MD  glimepiride (AMARYL) 4  MG tablet Take 8 mg by mouth daily before breakfast.    Historical Provider, MD  losartan (COZAAR) 50 MG tablet Take 50 mg by mouth daily.    Historical Provider, MD  meclizine (ANTIVERT) 12.5 MG tablet Take 12.5 mg by mouth 3 (three) times daily as needed for dizziness.    Historical Provider, MD  pantoprazole (PROTONIX) 20 MG tablet Take 40 mg by mouth daily.    Historical Provider, MD  rosiglitazone (AVANDIA) 8 MG tablet Take 8 mg by mouth daily.    Historical Provider, MD   BP 182/89 mmHg  Pulse 90  Temp(Src) 98.5 F (36.9 C) (Oral)  Resp 18  Ht 5\' 2"  (1.575 m)  Wt 83.915 kg   BMI 33.83 kg/m2  SpO2 98% Physical Exam  Constitutional: She is oriented to person, place, and time. She appears well-developed and well-nourished. No distress.  HENT:  Head: Normocephalic and atraumatic.  Eyes: Conjunctivae are normal. No scleral icterus.  Neck: Normal range of motion.  Cardiovascular: Normal rate, regular rhythm and normal heart sounds.  Exam reveals no gallop and no friction rub.   No murmur heard. Pulmonary/Chest: Effort normal and breath sounds normal. No respiratory distress.  Abdominal: Soft. Bowel sounds are normal. She exhibits no distension and no mass. There is no tenderness. There is no guarding.  Neurological: She is alert and oriented to person, place, and time.  Skin: Skin is warm and dry. She is not diaphoretic.  Nursing note and vitals reviewed.   ED Course  Procedures (including critical care time) Labs Review Labs Reviewed  BASIC METABOLIC PANEL  CBC  TROPONIN I    Imaging Review No results found. I have personally reviewed and evaluated these images and lab results as part of my medical decision-making.   EKG Interpretation   Date/Time:  Friday September 14 2015 21:35:43 EST Ventricular Rate:  93 PR Interval:  186 QRS Duration: 96 QT Interval:  374 QTC Calculation: 465 R Axis:   11 Text Interpretation:  Normal sinus rhythm Nonspecific ST and T wave  abnormality , new since last tracing Abnormal ECG Confirmed by KNAPP   MD-J, JON [id number]) on 09/14/2015 9:41:08 PM      MDM   Final diagnoses:  Chest pain, unspecified chest pain type    10:28 PM BP 182/89 mmHg  Pulse 90  Temp(Src) 98.5 F (36.9 C) (Oral)  Resp 18  Ht 5\' 2"  (1.575 m)  Wt 83.915 kg  BMI 33.83 kg/m2  SpO2 98% 82 y/o Female with EKG changes.  She appears to have some new St depression in the lateral leads. I have ordered 162 asa to complete a full dose.  Feel she will need  CP r/o.  Patient seen in shared visit with attending physician.   12:18 AM BP 190/77  mmHg  Pulse 94  Temp(Src) 98.5 F (36.9 C) (Oral)  Resp 15  Ht 5\' 2"  (1.575 m)  Wt 83.915 kg  BMI 33.83 kg/m2  SpO2 98% Heart score is 6. Currently cp free.  12:46 AM Patient accepted for obs admission for CP r/o.   Margarita Mail, PA-C 09/15/15 [phone]

## 2015-09-15 ENCOUNTER — Encounter (HOSPITAL_BASED_OUTPATIENT_CLINIC_OR_DEPARTMENT_OTHER): Payer: Self-pay | Admitting: Internal Medicine

## 2015-09-15 DIAGNOSIS — K219 Gastro-esophageal reflux disease without esophagitis: Secondary | ICD-10-CM

## 2015-09-15 DIAGNOSIS — I10 Essential (primary) hypertension: Secondary | ICD-10-CM | POA: Diagnosis not present

## 2015-09-15 DIAGNOSIS — E119 Type 2 diabetes mellitus without complications: Secondary | ICD-10-CM

## 2015-09-15 DIAGNOSIS — N183 Chronic kidney disease, stage 3 unspecified: Secondary | ICD-10-CM | POA: Diagnosis present

## 2015-09-15 DIAGNOSIS — J4520 Mild intermittent asthma, uncomplicated: Secondary | ICD-10-CM

## 2015-09-15 DIAGNOSIS — E1129 Type 2 diabetes mellitus with other diabetic kidney complication: Secondary | ICD-10-CM | POA: Diagnosis present

## 2015-09-15 DIAGNOSIS — R079 Chest pain, unspecified: Secondary | ICD-10-CM | POA: Diagnosis not present

## 2015-09-15 DIAGNOSIS — I200 Unstable angina: Secondary | ICD-10-CM

## 2015-09-15 DIAGNOSIS — E785 Hyperlipidemia, unspecified: Secondary | ICD-10-CM | POA: Diagnosis not present

## 2015-09-15 DIAGNOSIS — M109 Gout, unspecified: Secondary | ICD-10-CM | POA: Diagnosis present

## 2015-09-15 LAB — TROPONIN I: TROPONIN I: 0.03 ng/mL (ref <0.031)

## 2015-09-15 LAB — GLUCOSE, CAPILLARY
GLUCOSE-CAPILLARY: 92 mg/dL (ref 65–99)
GLUCOSE-CAPILLARY: 223 mg/dL — AB (ref 65–99)
GLUCOSE-CAPILLARY: 184 mg/dL — AB (ref 65–99)
Glucose-Capillary: 98 mg/dL (ref 65–99)

## 2015-09-15 LAB — LIPID PANEL
Cholesterol: 139 mg/dL (ref 0–200)
HDL: 35 mg/dL — ABNORMAL LOW (ref >40)
LDL CALC: 80 mg/dL (ref 0–99)
TRIGLYCERIDES: 119 mg/dL (ref <150)
Total CHOL/HDL Ratio: 4 RATIO
VLDL: 24 mg/dL (ref 0–40)

## 2015-09-15 LAB — PROTIME-INR
INR: 1.07 (ref 0.00–1.49)
PROTHROMBIN TIME: 14.1 s (ref 11.6–15.2)

## 2015-09-15 LAB — APTT: aPTT: 30 seconds (ref 24–37)

## 2015-09-15 LAB — BRAIN NATRIURETIC PEPTIDE: B NATRIURETIC PEPTIDE 5: 133.7 pg/mL — AB (ref 0.0–100.0)

## 2015-09-15 MED ORDER — LOSARTAN POTASSIUM 50 MG PO TABS
50.0000 mg | ORAL_TABLET | Freq: Every day | ORAL | Status: DC
  Administered 2015-09-15: 50 mg via ORAL
  Filled 2015-09-15 (×2): qty 1

## 2015-09-15 MED ORDER — HYDRALAZINE HCL 20 MG/ML IJ SOLN: 5.0000 mg | INTRAMUSCULAR | Status: DC | PRN

## 2015-09-15 MED ORDER — ACETAMINOPHEN 325 MG PO TABS: 650.0000 mg | ORAL_TABLET | ORAL | Status: DC | PRN

## 2015-09-15 MED ORDER — ALLOPURINOL 100 MG PO TABS
100.0000 mg | ORAL_TABLET | Freq: Every day | ORAL | Status: DC
  Administered 2015-09-15: 100 mg via ORAL
  Filled 2015-09-15 (×3): qty 1

## 2015-09-15 MED ORDER — SODIUM CHLORIDE 0.9 % WEIGHT BASED INFUSION
1.0000 mL/kg/h | INTRAVENOUS | Status: DC
Start: 2015-09-16 — End: 2015-09-17
  Administered 2015-09-16: 1 mL/kg/h via INTRAVENOUS

## 2015-09-15 MED ORDER — ONDANSETRON HCL 4 MG/2ML IJ SOLN: 4.0000 mg | Freq: Four times a day (QID) | INTRAMUSCULAR | Status: DC | PRN

## 2015-09-15 MED ORDER — ZOLPIDEM TARTRATE 5 MG PO TABS: 5.0000 mg | ORAL_TABLET | Freq: Every evening | ORAL | Status: DC | PRN

## 2015-09-15 MED ORDER — AMLODIPINE BESYLATE 10 MG PO TABS
10.0000 mg | ORAL_TABLET | Freq: Every day | ORAL | Status: DC
  Administered 2015-09-15 – 2015-09-18 (×4): 10 mg via ORAL
  Filled 2015-09-15 (×5): qty 1

## 2015-09-15 MED ORDER — ATORVASTATIN CALCIUM 80 MG PO TABS
80.0000 mg | ORAL_TABLET | Freq: Every day | ORAL | Status: DC
  Administered 2015-09-15 – 2015-09-21 (×6): 80 mg via ORAL
  Filled 2015-09-15 (×8): qty 1

## 2015-09-15 MED ORDER — ASPIRIN 81 MG PO CHEW
81.0000 mg | CHEWABLE_TABLET | ORAL | Status: AC
  Administered 2015-09-17: 81 mg via ORAL
  Filled 2015-09-15: qty 1

## 2015-09-15 MED ORDER — NITROGLYCERIN 0.4 MG SL SUBL: 0.4000 mg | SUBLINGUAL_TABLET | SUBLINGUAL | Status: DC | PRN

## 2015-09-15 MED ORDER — MECLIZINE HCL 12.5 MG PO TABS
12.5000 mg | ORAL_TABLET | Freq: Three times a day (TID) | ORAL | Status: DC | PRN
  Filled 2015-09-15: qty 1

## 2015-09-15 MED ORDER — SODIUM CHLORIDE 0.9 % IV SOLN
INTRAVENOUS | Status: DC
  Administered 2015-09-15 – 2015-09-16 (×2): via INTRAVENOUS

## 2015-09-15 MED ORDER — ATENOLOL 25 MG PO TABS
50.0000 mg | ORAL_TABLET | Freq: Every day | ORAL | Status: DC
  Administered 2015-09-15 – 2015-09-17 (×3): 50 mg via ORAL
  Filled 2015-09-15 (×3): qty 2

## 2015-09-15 MED ORDER — ALBUTEROL SULFATE (2.5 MG/3ML) 0.083% IN NEBU
2.5000 mg | INHALATION_SOLUTION | RESPIRATORY_TRACT | Status: DC | PRN
  Administered 2015-09-17: 2.5 mg via RESPIRATORY_TRACT
  Filled 2015-09-15: qty 3

## 2015-09-15 MED ORDER — HEPARIN SODIUM (PORCINE) 5000 UNIT/ML IJ SOLN
5000.0000 [IU] | Freq: Three times a day (TID) | INTRAMUSCULAR | Status: DC
  Administered 2015-09-15 – 2015-09-17 (×7): 5000 [IU] via SUBCUTANEOUS
  Filled 2015-09-15 (×7): qty 1

## 2015-09-15 MED ORDER — MORPHINE SULFATE (PF) 2 MG/ML IV SOLN
2.0000 mg | INTRAVENOUS | Status: DC | PRN
Start: 2015-09-15 — End: 2015-09-19

## 2015-09-15 MED ORDER — HYDRALAZINE HCL 10 MG PO TABS
20.0000 mg | ORAL_TABLET | Freq: Three times a day (TID) | ORAL | Status: DC
  Administered 2015-09-15 – 2015-09-17 (×6): 20 mg via ORAL
  Filled 2015-09-15 (×7): qty 2

## 2015-09-15 MED ORDER — SODIUM CHLORIDE 0.9% FLUSH
3.0000 mL | Freq: Two times a day (BID) | INTRAVENOUS | Status: DC
  Administered 2015-09-16 – 2015-09-17 (×2): 3 mL via INTRAVENOUS

## 2015-09-15 MED ORDER — SODIUM CHLORIDE 0.9% FLUSH: 3.0000 mL | INTRAVENOUS | Status: DC | PRN

## 2015-09-15 MED ORDER — INSULIN ASPART 100 UNIT/ML ~~LOC~~ SOLN
0.0000 [IU] | Freq: Three times a day (TID) | SUBCUTANEOUS | Status: DC
  Administered 2015-09-15: 3 [IU] via SUBCUTANEOUS
  Administered 2015-09-16: 2 [IU] via SUBCUTANEOUS
  Administered 2015-09-16: 1 [IU] via SUBCUTANEOUS
  Administered 2015-09-18: 2 [IU] via SUBCUTANEOUS

## 2015-09-15 MED ORDER — ASPIRIN 325 MG PO TABS: 325.0000 mg | ORAL_TABLET | Freq: Every day | ORAL | Status: DC

## 2015-09-15 MED ORDER — INSULIN GLARGINE 100 UNIT/ML ~~LOC~~ SOLN
15.0000 [IU] | Freq: Two times a day (BID) | SUBCUTANEOUS | Status: DC
  Administered 2015-09-15 – 2015-09-18 (×7): 15 [IU] via SUBCUTANEOUS
  Filled 2015-09-15 (×10): qty 0.15

## 2015-09-15 MED ORDER — SODIUM CHLORIDE 0.9 % IV SOLN: 250.0000 mL | INTRAVENOUS | Status: DC | PRN

## 2015-09-15 MED ORDER — ASPIRIN 81 MG PO CHEW: 81.0000 mg | CHEWABLE_TABLET | ORAL | Status: DC

## 2015-09-15 MED ORDER — ASPIRIN 325 MG PO TABS
325.0000 mg | ORAL_TABLET | Freq: Every day | ORAL | Status: AC
  Administered 2015-09-15 – 2015-09-16 (×2): 325 mg via ORAL
  Filled 2015-09-15 (×2): qty 1

## 2015-09-15 MED ORDER — PANTOPRAZOLE SODIUM 40 MG PO TBEC
40.0000 mg | DELAYED_RELEASE_TABLET | Freq: Every day | ORAL | Status: DC
  Administered 2015-09-15 – 2015-09-18 (×3): 40 mg via ORAL
  Filled 2015-09-15 (×4): qty 1

## 2015-09-15 NOTE — Progress Notes (Addendum)
Patient received from [place]. Oriented to room, no pain, distress or needs expressed at this time.Daughters ar bedside. [facility] paged, call light within reach

## 2015-09-15 NOTE — H&P (Signed)
[facility] History and Physical  Monica [name] [id number] DOB: 1930-10-26 DOA: 09/14/2015  Referring physician: ED physician PCP: Benito Mccreedy, MD  Specialists:   Chief Complaint: chest pain  HPI: Monica [name] is a 82 y.o. female with PMH of hypertension, hyperlipidemia, diabetes mellitus, asthma, GERD, gout, chronic kidney disease-stage III, peptic ulcer disease, who presents with chest pain.  Pt reports that she has been having intermittent chest pain for about 2 months. The chest pain happens 2 or 3 times per week. Each time, it lasts for about 20-30 minutes, then resolved spontaneously. She had another episode of chest pain today at about 6:00PM. It is located in the substernal area, severe, sharp, nonradiating. She states that her chest pain is exertional, walking make her chest pain worse. Patient does not have cough, SOB, fever, chills. She has leg pain behind knee joint, but no tenderness over calf areas. Patient does not have abdominal pain, diarrhea, symptoms of UTI, nausea, vomiting, unilateral weakness. She was found to have elevated blood pressure at 190/77 in the emergency room.  In ED, patient was found to have Troponin negative, negative urinalysis, negative chest x-ray, WBC 8.4, temperature normal, heart rate 94, respiration 15, oxygen saturation 98%. Patient is admitted to inpatient for further interventional treatment.  EKG: Independently reviewed. QTC 461,  biphasic T wave in V4-V6.  Where does patient live?   At home  Can patient participate in ADLs?  Little  Review of Systems:   General: no fevers, chills, no changes in body weight, has fatigue HEENT: no blurry vision, hearing changes or sore throat Pulm: no dyspnea, coughing, wheezing CV: has chest pain, no palpitations Abd: no nausea, vomiting, abdominal pain, diarrhea, constipation GU: no dysuria, burning on urination, increased urinary frequency, hematuria  Ext: no leg edema Neuro: no unilateral  weakness, numbness, or tingling, no vision change or hearing loss Skin: no rash MSK: No muscle spasm, no deformity, no limitation of range of movement in spin Heme: No easy bruising.  Travel history: No recent long distant travel.  Allergy: No Known Allergies  Past Medical History  Diagnosis Date  . Peptic ulcer   . Mass   . Hypertension   . Hyperlipidemia   . Diabetes mellitus without complication ([place])   . Asthma   . Blood transfusion without reported diagnosis   . Renal disorder   . CKD (chronic kidney disease), stage III     Past Surgical History  Procedure Laterality Date  . Abdominal hysterectomy    . Colon surgery      Social History:  reports that she has never smoked. She has quit using smokeless tobacco. Her smokeless tobacco use included Snuff. She reports that she does not drink alcohol or use illicit drugs.  Family History:  Family History  Problem Relation Age of Onset  . Heart attack Father      Prior to Admission medications   Medication Sig Start Date End Date Taking? Authorizing Provider  amLODipine (NORVASC) 10 MG tablet Take 10 mg by mouth daily.   Yes Historical Provider, MD  atorvastatin (LIPITOR) 80 MG tablet Take 80 mg by mouth daily.   Yes Historical Provider, MD  diphenhydrAMINE (BENADRYL) 25 MG tablet Take 25 mg by mouth as needed.    Yes Historical Provider, MD  furosemide (LASIX) 20 MG tablet  05/17/14  Yes Historical Provider, MD  insulin glargine (LANTUS) 100 UNIT/ML injection Inject 100 Units into the skin 2 (two) times daily.   Yes Historical Provider, MD  insulin  lispro (HUMALOG) 100 UNIT/ML cartridge Inject into the skin 3 (three) times daily between meals as needed.   Yes Historical Provider, MD  IRON CR PO Take 65 mg by mouth 2 (two) times daily.   Yes Historical Provider, MD  allopurinol (ZYLOPRIM) 100 MG tablet  07/09/14   Historical Provider, MD  atenolol-chlorthalidone (TENORETIC) 50-25 MG per tablet Take 1 tablet by mouth daily.     Historical Provider, MD  glimepiride (AMARYL) 4 MG tablet Take 8 mg by mouth daily before breakfast.    Historical Provider, MD  losartan (COZAAR) 50 MG tablet Take 50 mg by mouth daily.    Historical Provider, MD  meclizine (ANTIVERT) 12.5 MG tablet Take 12.5 mg by mouth 3 (three) times daily as needed for dizziness.    Historical Provider, MD  pantoprazole (PROTONIX) 20 MG tablet Take 40 mg by mouth daily.    Historical Provider, MD  rosiglitazone (AVANDIA) 8 MG tablet Take 8 mg by mouth daily.    Historical Provider, MD    Physical Exam: Filed Vitals:   09/15/15 0002 09/15/15 0030 09/15/15 0100 09/15/15 0227  BP: 190/77 171/81 182/92 157/72  Pulse: 94 90 94 91  Temp:    98.1 F (36.7 C)  TempSrc:    Oral  Resp: 15 19 18 18   Height:    5\' 2"  (1.575 m)  Weight:    84.687 kg (186 lb 11.2 oz)  SpO2: 98% 97% 97% 96%   General: Not in acute distress HEENT:       Eyes: PERRL, EOMI, no scleral icterus.       ENT: No discharge from the ears and nose, no pharynx injection, no tonsillar enlargement.        Neck: No JVD, no bruit, no mass felt. Heme: No neck lymph node enlargement. Cardiac: S1/S2, RRR, No murmurs, No gallops or rubs. Pulm:  No rales, wheezing, rhonchi or rubs. Abd: Soft, nondistended, nontender, no rebound pain, no organomegaly, BS present. Ext: No pitting leg edema bilaterally. 2+DP/PT pulse bilaterally. Musculoskeletal: No joint deformities, No joint redness or warmth, no limitation of ROM in spin. Skin: No rashes.  Neuro: Alert, oriented X3, cranial nerves II-XII grossly intact, moves all extremities normally. Psych: Patient is not psychotic, no suicidal or hemocidal ideation.  Labs on Admission:  Basic Metabolic Panel:  Recent Labs Lab 09/14/15 2230  NA 138  K 3.8  CL 101  CO2 25  GLUCOSE 180*  BUN 33*  CREATININE 1.55*  CALCIUM 8.9   Liver Function Tests: No results for input(s): AST, ALT, ALKPHOS, BILITOT, PROT, ALBUMIN in the last 168 hours. No  results for input(s): LIPASE, AMYLASE in the last 168 hours. No results for input(s): AMMONIA in the last 168 hours. CBC:  Recent Labs Lab 09/14/15 2230  WBC 8.4  HGB 11.1*  HCT 34.0*  MCV 88.1  PLT 239   Cardiac Enzymes:  Recent Labs Lab 09/14/15 2230  TROPONINI <0.03    BNP (last 3 results) No results for input(s): BNP in the last 8760 hours.  ProBNP (last 3 results) No results for input(s): PROBNP in the last 8760 hours.  CBG: No results for input(s): GLUCAP in the last 168 hours.  Radiological Exams on Admission: Dg Chest 2 View  09/14/2015  CLINICAL DATA:  Chest pain just prior to arrival. EXAM: CHEST  2 VIEW COMPARISON:  11/22/2012 FINDINGS: Stable mediastinal contours with borderline cardiomegaly. No pulmonary edema, confluent airspace disease, pleural effusion or pneumothorax. Exaggerated thoracic kyphosis and degenerative  change throughout the thoracic spine, stable. IMPRESSION: Unchanged appearance of the chest.  No acute process. Electronically Signed   By: Jeb Levering M.D.   On: 09/14/2015 23:04    Assessment/Plan Principal Problem:   Chest pain Active Problems:   Accelerated hypertension   Hyperlipidemia   Diabetes mellitus without complication (HCC)   Asthma   CKD (chronic kidney disease), stage III   GERD (gastroesophageal reflux disease)   Gout   Chest pain: Patient has typical exertional chest pain, which has been going on for almost 2 month. Patient may have stable angina, which has been worsened by demanding ischemia secondary to accelerated blood pressure today. Currently patient is chest pain-free. His blood pressure has improved from 190/77-157/72.  - will admit to Tele bed  - cycle CE q6 x3 and repeat her EKG in the am  - Nitroglycerin, Morphine, and aspirin, lipitor  - Risk factor stratification: will check FLP and A1C  - 2d echo - Nothing by mouth - please call card in AM  Accelerated hypertension: Initial blood pressure 190/77.  No signs of stroke, but had worsening chest pain. Blood pressure improved after being treated with nitroglycerin patch in the emergency room. -IV hydralazine when necessary -Switch atenolol-chlorthalidone to atenolol only -Amlodipine, losartan -Hold Lasix while patient is on nothing by mouth  HLD: Last LDL was not on record -Continue home medications: Lipitor -Check FLP  DM-II: Last A1c not on record. Patient is taking that has, sliding-scale insulin, Amaryl, rosiglitazone at home. Per Epic records, patient supposed to take 100 units of Lantus twice a day, but patient strongly insists that she is taking 20 units Lantus twice a day at home. -will decrease Lantus dose from 20 units to 15 units twice a day  -SSI -Check A1c  Asthma: Stable -When necessary albuterol nebulizer  CKD (chronic kidney disease), stage III: Stable. Previous creatinine was 1.70 on 11/22/12. Her creatinine is 1.55, BUN 33 today. -Follow-up renal function by BMP  GERD: -Protonix  Gout: -continue home allopurinol  DVT ppx: SQ Heparin (if pt develops severe chest pain or significantly elevated trop, will be easier to switch to IV heparin or stop heparin for procedure than using Lovenox).   Code Status: Full code Family Communication: Yes, patient's two daughers at bed side Disposition Plan: Admit to inpatient   Date of Service 09/15/2015    Ivor Costa [facility] Pager [phone]  If 7PM-7AM, please contact night-coverage [email or web address] Password [email or web address] 09/15/2015, 3:35 AM

## 2015-09-15 NOTE — Progress Notes (Addendum)
Patient seen and examined this morning, admitted overnight by Dr. Blaine Hamper  82 y.o. female with PMH of hypertension, hyperlipidemia, diabetes mellitus, asthma, GERD, gout, chronic kidney disease-stage III, peptic ulcer disease, who presented with chest pain. Patient tells me that her chest pain is sometimes associated with food however most times exertional. She was walking in the store yesterday when she developed  Chest pain so severe that she had to stop and bend over her shopping cart. Chest pain went away with rest this has happened 2 more times yesterday.   Chest pain  - cardiology consulted, appreciate input. Plan for cardiac catheterization on Monday - concerning for cardiac etiology given relationship with exertion and resolution with rest.  - cardiac enzymes negative 3  Accelerated hypertension - Initial blood pressure 190/77. No signs of stroke, but had worsening chest pain. Blood pressure improved after being treated with nitroglycerin patch in the emergency room. - improved this morning, continue Norvasc, Hydralazine, Atenolol  HLD - Last LDL was not on record -Continue home medications: Lipitor -Check FLP  DM-II  - Last A1c not on record. Patient is taking that has, sliding-scale insulin, Amaryl, rosiglitazone at home. Per Epic records, patient supposed to take 100 units of Lantus twice a day, but patient strongly insists that she is taking 20 units Lantus twice a day at home. - will decrease Lantus dose from 20 units to 15 units twice a day  - SSI - Check A1c  Asthma  - Stable - When necessary albuterol nebulizer  CKD (chronic kidney disease), stage III  - Stable. Previous creatinine was 1.70 on 11/22/12. Her creatinine is 1.55, BUN 33 today. - Follow-up renal function by BMP - hold home lasix in light of cath on Monday   GERD: - Protonix  Gout - continue home allopurinol   Costin M. Cruzita Lederer, MD [facility] [phone]

## 2015-09-15 NOTE — Consult Note (Signed)
CARDIOLOGY CONSULT NOTE   Patient ID: Monica [name] MRN: [id number] DOB/AGE: 80-Jan-1932 80 y.o.  Admit date: 09/14/2015  Primary Physician   Benito Mccreedy, MD Primary Cardiologist   New Reason for Consultation   [facility]AP  HPI:Monica [name] is an 82 y.o. year old female with a history of PUD, HTN, HLD, DM, CKD III, gout.   She was admitted with chest pain and cardiology was asked to evaluate her.  Her chest pain started without exertion at first, generally 30" to an hour after eating. About 10 episodes total. She would also notice chest pain after exertion. She would walk enough to get SOB, then rest and when she went to walk again ,would get chest pain. It would resolve with rest. 10/10 at its worst. No N&V, a little diaphoresis.   The chest pain initially would resolve with GI meds, but last pm, it did not. When her pain did not resolve, she came to the ER, and her symptoms resolved with nitrates and have not returned. She has not had LE edema, orthopnea or PND. She has chronic DOE, no recent change. She is currently pain-free.   Past Medical History  Diagnosis Date  . Peptic ulcer   . Mass   . Hypertension   . Hyperlipidemia   . Diabetes mellitus without complication ([place])   . Asthma   . Blood transfusion without reported diagnosis   . Renal disorder   . CKD (chronic kidney disease), stage III      Past Surgical History  Procedure Laterality Date  . Abdominal hysterectomy    . Colon surgery      No Known Allergies  I have reviewed the patient's current medications . allopurinol  100 mg Oral Daily  . amLODipine  10 mg Oral Daily  . aspirin  325 mg Oral Daily  . atenolol  50 mg Oral Daily  . atorvastatin  80 mg Oral q1800  . heparin  5,000 Units Subcutaneous 3 times per day  . insulin aspart  0-9 Units Subcutaneous TID WC  . insulin glargine  15 Units Subcutaneous BID  . losartan  50 mg Oral Daily  . pantoprazole  40 mg Oral Daily   . sodium chloride 75 mL/hr  at 09/15/15 0357   acetaminophen, albuterol, hydrALAZINE, meclizine, morphine injection, nitroGLYCERIN, ondansetron (ZOFRAN) IV, zolpidem  Prior to Admission medications   Medication Sig Start Date End Date Taking? Authorizing Provider  amLODipine (NORVASC) 10 MG tablet Take 10 mg by mouth daily.   Yes Historical Provider, MD  atorvastatin (LIPITOR) 80 MG tablet Take 80 mg by mouth daily.   Yes Historical Provider, MD  diphenhydrAMINE (BENADRYL) 25 MG tablet Take 25 mg by mouth as needed.    Yes Historical Provider, MD  furosemide (LASIX) 20 MG tablet  05/17/14  Yes Historical Provider, MD  insulin glargine (LANTUS) 100 UNIT/ML injection Inject 100 Units into the skin 2 (two) times daily.   Yes Historical Provider, MD  insulin lispro (HUMALOG) 100 UNIT/ML cartridge Inject into the skin 3 (three) times daily between meals as needed.   Yes Historical Provider, MD  IRON CR PO Take 65 mg by mouth 2 (two) times daily.   Yes Historical Provider, MD  allopurinol (ZYLOPRIM) 100 MG tablet  07/09/14   Historical Provider, MD  atenolol-chlorthalidone (TENORETIC) 50-25 MG per tablet Take 1 tablet by mouth daily.    Historical Provider, MD  glimepiride (AMARYL) 4 MG tablet Take 8 mg by mouth daily before breakfast.  Historical Provider, MD  losartan (COZAAR) 50 MG tablet Take 50 mg by mouth daily.    Historical Provider, MD  meclizine (ANTIVERT) 12.5 MG tablet Take 12.5 mg by mouth 3 (three) times daily as needed for dizziness.    Historical Provider, MD  pantoprazole (PROTONIX) 20 MG tablet Take 40 mg by mouth daily.    Historical Provider, MD  rosiglitazone (AVANDIA) 8 MG tablet Take 8 mg by mouth daily.    Historical Provider, MD     Social History   Social History  . Marital Status: Widowed    Spouse Name: N/A  . Number of Children: N/A  . Years of Education: N/A   Occupational History  . Retired    Social History Main Topics  . Smoking status: Never Smoker   . Smokeless tobacco: Former  Systems developer    Types: Snuff  . Alcohol Use: No  . Drug Use: No  . Sexual Activity: Not on file   Other Topics Concern  . Not on file   Social History Narrative    Family Status  Relation Status Death Age  . Mother Deceased   . Father Deceased    Family History  Problem Relation Age of Onset  . Heart attack Father      ROS:  Full 14 point review of systems complete and found to be negative unless listed above.  Physical Exam: Blood pressure 130/57, pulse 85, temperature 98.1 F (36.7 C), temperature source Oral, resp. rate 18, height 5\' 2"  (1.575 m), weight 186 lb 11.2 oz (84.687 kg), SpO2 96 %.  General: Well developed, well nourished, female in no acute distress Head: Eyes PERRLA, No xanthomas.   Normocephalic and atraumatic, oropharynx without edema or exudate. Dentition: good Lungs: clear bilaterally Heart: HRRR S1 S2, no rub/gallop, no murmur. pulses are 2+ all 4 extrem.   Neck: No carotid bruits. No lymphadenopathy.  JVD not elevated. Abdomen: Bowel sounds present, abdomen soft and non-tender without masses or hernias noted. Msk:  No spine or cva tenderness. No weakness, no joint deformities or effusions. Extremities: No clubbing or cyanosis. No edema.  Neuro: Alert and oriented X 3. No focal deficits noted. Psych:  Good affect, responds appropriately Skin: No rashes or lesions noted.  Labs:   Lab Results  Component Value Date   WBC 8.4 09/14/2015   HGB 11.1* 09/14/2015   HCT 34.0* 09/14/2015   MCV 88.1 09/14/2015   PLT 239 09/14/2015    Recent Labs  09/15/15 0343  INR 1.07     Recent Labs Lab 09/14/15 2230  NA 138  K 3.8  CL 101  CO2 25  BUN 33*  CREATININE 1.55*  CALCIUM 8.9  GLUCOSE 180*     Recent Labs  09/14/15 2230 09/15/15 0343  TROPONINI <0.03 0.03   B NATRIURETIC PEPTIDE  Date/Time Value Ref Range Status  09/15/2015 03:43 AM 133.7* 0.0 - 100.0 pg/mL Final   Lab Results  Component Value Date   CHOL 139 09/15/2015   HDL 35*  09/15/2015   LDLCALC 80 09/15/2015   TRIG 119 09/15/2015    Echo: Ordered  ECG:  09/15/2015 Sinus rhythm, no acute ischemic changes, one PVC No significant change from 2014 ECG  Radiology:  Dg Chest 2 View 09/14/2015  CLINICAL DATA:  Chest pain just prior to arrival. EXAM: CHEST  2 VIEW COMPARISON:  11/22/2012 FINDINGS: Stable mediastinal contours with borderline cardiomegaly. No pulmonary edema, confluent airspace disease, pleural effusion or pneumothorax. Exaggerated thoracic kyphosis and degenerative  change throughout the thoracic spine, stable. IMPRESSION: Unchanged appearance of the chest.  No acute process. Electronically Signed   By: Jeb Levering M.D.   On: 09/14/2015 23:04    ASSESSMENT AND PLAN:   The patient was seen today by Dr Domenic Polite, the patient evaluated and the data reviewed.  Principal Problem:   Unstable angina - ez negative so far - initial symptoms possibly GI, but exertional symptoms very concerning for angina - multiple CRFs - ECG with dynamic ST segment changes - cardiac cath is indicated, can do on Monday  Otherwise, per IM. Active Problems:   Accelerated hypertension   Hyperlipidemia   Diabetes mellitus without complication (HCC)   Asthma   CKD (chronic kidney disease), stage III   GERD (gastroesophageal reflux disease)   Gout   Signed: Lenoard Aden 09/15/2015 8:41 AM Beeper [id number]  Co-Sign MD  Attending note:  Patient seen and examined. Reviewed records and agree with above assessment by Ms. Barrett PA-C. Ms. Renninger presents with a two-month history of progressing episodic chest discomfort, initially noted after eating although not consistently relieved with antacids, and in the last several weeks recurring with exertion with both increasing intensity and frequency. Her daughter brought her to the hospital after she developed 3 episodes of moderate to severe chest discomfort with shortness of breath in the span of an hour while  shopping. She was noted to be significantly hypertensive at presentation, serial ECGs show dynamic lateral ST segment depression. Troponin I levels have been normal. She is presently chest pain-free. Cardiac risk factors include type 2 diabetes mellitus, hyperlipidemia, and hypertension. She also has CKD stage III, present creatinine 1.5.  On examination she appears comfortable without chest pain. Recent systolic blood pressure 0000000 to 150 with heart rate in the 80s and sinus rhythm. She had one burst of a symptomatic NSVT by telemetry. Lungs exhibit decreased but otherwise clear breath sounds. Cardiac exam reveals RRR with soft systolic murmur. She has 2+ distal pulses and no pitting edema. Lab work shows creatinine 1.5, troponin I levels negative 3, BNP 133, LDL 80, hemoglobin 11.1, platelets 239. Chest x-ray reveals borderline cardiomegaly with no pulmonary edema or pleural effusions.  Patient presents with symptoms consistent with unstable angina associated with dynamic lateral ST segment depression although normal cardiac markers. She has intermediate to high risk for underlying CAD based on cardiac risk factor profile and with her presenting symptoms, we discussed proceeding to a diagnostic cardiac catheterization for clear evaluation of coronary anatomy. After discussing the risks and benefits, patient is in agreement to proceed. She does have some increased risk for contrast nephropathy, although most recent creatinine is 1.5 and she is being gently hydrated. Would limit IV contrast with procedure. Echocardiogram will be obtained. We will tentatively schedule her for cardiac catheterization on Monday. Hold Cozaar temporarily and start hydralazine in the short-term for blood pressure control.   Satira Sark, M.D., F.A.C.C.

## 2015-09-15 NOTE — Evaluation (Signed)
Physical Therapy Evaluation Patient Details Name: Monica [name] MRN: [id number] DOB: [birth year] Today's Date: 09/15/2015   History of Present Illness  Alvah Dutkiewicz is a 82 y.o. female with PMH of hypertension, hyperlipidemia, diabetes mellitus, asthma, GERD, gout, chronic kidney disease-stage III, peptic ulcer disease, who presents with chest pain.  Clinical Impression  Patient presents with decreased mobility due to deficits listed in PT problem list.  She will benefit from skilled PT in the acute setting to allow return home with family support.  Likely no need for folllow up PT.    Follow Up Recommendations No PT follow up;Supervision - Intermittent    Equipment Recommendations  None recommended by PT    Recommendations for Other Services       Precautions / Restrictions Precautions Precautions: Fall Precaution Comments: mild unsteadiness with ambualtion      Mobility  Bed Mobility Overal bed mobility: Modified Independent             General bed mobility comments: with rail  Transfers Overall transfer level: Needs assistance Equipment used: None Transfers: Sit to/from Stand Sit to Stand: Min guard         General transfer comment: for balance up from EOB with assist for IV  Ambulation/Gait Ambulation/Gait assistance: Supervision;Min guard Ambulation Distance (Feet): 150 Feet Assistive device: None Gait Pattern/deviations: Decreased stride length;Trunk flexed;Drifts right/left     General Gait Details: no overt LOB, but noted some unsteadiness with lateral movements and pt admits to feeling little unsteady; has been NPO since admit  Stairs            Wheelchair Mobility    Modified Rankin (Stroke Patients Only)       Balance Overall balance assessment: Needs assistance   Sitting balance-Leahy Scale: Normal       Standing balance-Leahy Scale: Good Standing balance comment: static balance good for ADL's                              Pertinent Vitals/Pain Pain Assessment: No/denies pain    Home Living Family/patient expects to be discharged to:: Private residence Living Arrangements: Children Available Help at Discharge: Family Type of Home: House Home Access: Stairs to enter Entrance Stairs-Rails: Right Entrance Stairs-Number of Steps: 3 Home Layout: One level Home Equipment: Environmental consultant - 2 wheels      Prior Function Level of Independence: Independent         Comments: still drives to store     Hand Dominance   Dominant Hand: Right    Extremity/Trunk Assessment               Lower Extremity Assessment: RLE deficits/detail;LLE deficits/detail RLE Deficits / Details: AROM WFL, strength hip flexion 4-/5, knee extension 4/5 LLE Deficits / Details: AROM WFL, strength hip flexion 3-/5, knee extension 4/5     Communication   Communication: No difficulties  Cognition Arousal/Alertness: Awake/alert Behavior During Therapy: WFL for tasks assessed/performed Overall Cognitive Status: Within Functional Limits for tasks assessed                      General Comments General comments (skin integrity, edema, etc.): reports daughters here overnight and helped her to bathroom    Exercises        Assessment/Plan    PT Assessment Patient needs continued PT services  PT Diagnosis Generalized weakness   PT Problem List Decreased strength;Decreased balance;Decreased mobility;Decreased knowledge of use of DME;Decreased activity  tolerance  PT Treatment Interventions DME instruction;Gait training;Functional mobility training;Stair training;Balance training;Therapeutic activities;Therapeutic exercise   PT Goals (Current goals can be found in the Care Plan section) Acute Rehab PT Goals Patient Stated Goal: To go home, get something to eat PT Goal Formulation: With patient Time For Goal Achievement: 09/22/15 Potential to Achieve Goals: Good    Frequency Min 3X/week   Barriers to discharge         Co-evaluation               End of Session Equipment Utilized During Treatment: Gait belt Activity Tolerance: Patient tolerated treatment well Patient left: in bed;with call bell/phone within reach;with family/visitor present      Functional Assessment Tool Used: Clinical Judgement Functional Limitation: Mobility: Walking and moving around Mobility: Walking and Moving Around Current Status [id number]): At least 1 percent but less than 20 percent impaired, limited or restricted Mobility: Walking and Moving Around Goal Status [phone]): At least 1 percent but less than 20 percent impaired, limited or restricted    Time: 0920-0934 PT Time Calculation (min) (ACUTE ONLY): 14 min   Charges:   PT Evaluation $PT Eval Moderate Complexity: 1 Procedure     PT G Codes:   PT G-Codes **NOT FOR INPATIENT CLASS** Functional Assessment Tool Used: Clinical Judgement Functional Limitation: Mobility: Walking and moving around Mobility: Walking and Moving Around Current Status [id number]): At least 1 percent but less than 20 percent impaired, limited or restricted Mobility: Walking and Moving Around Goal Status [id number]): At least 1 percent but less than 20 percent impaired, limited or restricted    Reginia Naas 09/15/2015, 10:15 AM  Magda Kiel, PT [phone] 09/15/2015

## 2015-09-15 NOTE — Progress Notes (Addendum)
This is a no charge note  Transfer from [facility] per PA, Abigail  82 year old lady with past medical history of hypertension, hyperlipidemia, diabetes mellitus, asthma, GERD, gout, chronic kidney disease-stage III, peptic ulcer disease, who presents with chest pain.  Patient's chest pain has been going on intermittently for about 2 months. She had another episode of chest pain today, which has resolved. Currently no chest pain. Blood pressure elevated at 190/77. Patient has EKG change with biphasic T wave in V4-V6. Troponin negative, negative urinalysis, negative chest x-ray. WBC 8.4, temperature normal, heart rate 94, respiration 15, oxygen saturation 98%.  Patient is accepted to telemetry bed.  Ivor Costa, MD  [facility] Pager [phone]  If 7PM-7AM, please contact night-coverage [email or web address] Password [facility] 09/15/2015, 12:37 AM

## 2015-09-16 DIAGNOSIS — R079 Chest pain, unspecified: Secondary | ICD-10-CM | POA: Diagnosis not present

## 2015-09-16 DIAGNOSIS — I200 Unstable angina: Secondary | ICD-10-CM | POA: Diagnosis not present

## 2015-09-16 LAB — GLUCOSE, CAPILLARY
Glucose-Capillary: 92 mg/dL (ref 65–99)
Glucose-Capillary: 163 mg/dL — ABNORMAL HIGH (ref 65–99)
Glucose-Capillary: 216 mg/dL — ABNORMAL HIGH (ref 65–99)
Glucose-Capillary: 131 mg/dL — ABNORMAL HIGH (ref 65–99)
Glucose-Capillary: 191 mg/dL — ABNORMAL HIGH (ref 65–99)

## 2015-09-16 NOTE — Progress Notes (Signed)
PROGRESS NOTE  Monica [name] [id number] DOB: [birth year] DOA: 09/14/2015 PCP: Benito Mccreedy, MD Outpatient Specialists:      Brief Narrative: 82 y.o. female with PMH of hypertension, hyperlipidemia, diabetes mellitus, asthma, GERD, gout, chronic kidney disease-stage III, peptic ulcer disease, who presented with chest pain  Assessment & Plan: Principal Problem:   Chest pain Active Problems:   Accelerated hypertension   Hyperlipidemia   Diabetes mellitus without complication ([place])   Asthma   CKD (chronic kidney disease), stage III   GERD (gastroesophageal reflux disease)   Gout   Chest pain  - cardiology consulted, appreciate input. Plan for cardiac catheterization on Monday - concerning for cardiac etiology given relationship with exertion and resolution with rest.  - cardiac enzymes negative 3  Accelerated hypertension - Initial blood pressure 190/77.Blood pressure improved after being treated with nitroglycerin patch in the emergency room. - improved, continue Norvasc, Hydralazine, Atenolol  HLD - Last LDL was not on record - Continue home medications: Lipitor - LDL 80, HDL 35.  DM-II  - Last A1c not on record. Patient is taking that has, sliding-scale insulin, Amaryl, rosiglitazone at home. Per Epic records, patient supposed to take 100 units of Lantus twice a day, but patient strongly insists that she is taking 20 units Lantus twice a day at home. - will decrease Lantus dose from 20 units to 15 units twice a day  - SSI - Check A1c  Asthma  - Stable - When necessary albuterol nebulizer  CKD (chronic kidney disease), stage III  - Stable. Previous creatinine was 1.70 on 11/22/12. Her creatinine is 1.55, BUN 33 today. - Follow-up renal function by BMP - hold home lasix in light of cath on Monday   GERD: - Protonix  Gout - continue home allopurinol   DVT prophylaxis: heparin Code Status: Full Family Communication: d/w daughter bedside Disposition  Plan: home when ready  Barriers for discharge: cath Monday   Consultants:   Cardiology   Procedures:   2D echo: pending  Antimicrobials:  None    Subjective: - no chest pain, she appears comfortable this morning.   Objective: Filed Vitals:   09/15/15 0526 09/15/15 1300 09/15/15 2012 09/16/15 0512  BP: 130/57 138/59 135/60 145/65  Pulse: 85 72 70 70  Temp: 98.1 F (36.7 C) 98.2 F (36.8 C) 98 F (36.7 C) 98.1 F (36.7 C)  TempSrc: Oral Oral Oral Oral  Resp: 18 18 18 18   Height:      Weight:      SpO2: 96% 99% 98% 98%    Intake/Output Summary (Last 24 hours) at 09/16/15 1134 Last data filed at 09/15/15 1554  Gross per 24 hour  Intake      0 ml  Output    800 ml  Net   -800 ml   [facility] Weights   09/14/15 2139 09/15/15 0227  Weight: 83.915 kg (185 lb) 84.687 kg (186 lb 11.2 oz)    Examination: BP 145/65 mmHg  Pulse 70  Temp(Src) 98.1 F (36.7 C) (Oral)  Resp 18  Ht 5\' 2"  (1.575 m)  Wt 84.687 kg (186 lb 11.2 oz)  BMI 34.14 kg/m2  SpO2 98%  GENERAL: NAD  HEENT: head NCAT, no scleral icterus.   NECK: Supple. No carotid bruits. No lymphadenopathy or thyromegaly.  LUNGS: Clear to auscultation. No wheezing or crackles  HEART: Regular rate and rhythm without murmur. 2+ pulses, no JVD, no peripheral edema  ABDOMEN: Soft, nontender, and nondistended. Positive bowel sounds.    Data Reviewed:  I have personally reviewed following labs and imaging studies  CBC:  Recent Labs Lab 09/14/15 2230  WBC 8.4  HGB 11.1*  HCT 34.0*  MCV 88.1  PLT [id number]   Basic Metabolic Panel:  Recent Labs Lab 09/14/15 2230  NA 138  K 3.8  CL 101  CO2 25  GLUCOSE 180*  BUN 33*  CREATININE 1.55*  CALCIUM 8.9   GFR: Estimated Creatinine Clearance: 27.7 mL/min (by C-G formula based on Cr of 1.55). Liver Function Tests: No results for input(s): AST, ALT, ALKPHOS, BILITOT, PROT, ALBUMIN in the last 168 hours. No results for input(s): LIPASE, AMYLASE in the last 168  hours. No results for input(s): AMMONIA in the last 168 hours. Coagulation Profile:  Recent Labs Lab 09/15/15 0343  INR 1.07   Cardiac Enzymes:  Recent Labs Lab 09/14/15 2230 09/15/15 0343 09/15/15 0818 09/15/15 1454  TROPONINI <0.03 0.03 <0.03 <0.03   BNP (last 3 results) No results for input(s): PROBNP in the last 8760 hours. HbA1C: No results for input(s): HGBA1C in the last 72 hours. CBG:  Recent Labs Lab 09/15/15 0709 09/15/15 1139 09/15/15 1612 09/15/15 2141 09/16/15 0651  GLUCAP 98 92 223* 184* 92   Lipid Profile:  Recent Labs  09/15/15 0343  CHOL 139  HDL 35*  LDLCALC 80  TRIG 119  CHOLHDL 4.0   Thyroid Function Tests: No results for input(s): TSH, T4TOTAL, FREET4, T3FREE, THYROIDAB in the last 72 hours. Anemia Panel: No results for input(s): VITAMINB12, FOLATE, FERRITIN, TIBC, IRON, RETICCTPCT in the last 72 hours. Urine analysis:    Component Value Date/Time   COLORURINE YELLOW 09/14/2015 2317   COLORURINE Straw 07/09/2012 0108   APPEARANCEUR CLEAR 09/14/2015 2317   APPEARANCEUR Hazy 07/09/2012 0108   LABSPEC 1.007 09/14/2015 2317   LABSPEC 1.010 07/09/2012 0108   PHURINE 6.5 09/14/2015 2317   PHURINE 5.0 07/09/2012 0108   GLUCOSEU NEGATIVE 09/14/2015 2317   GLUCOSEU >=500 07/09/2012 0108   HGBUR SMALL* 09/14/2015 2317   HGBUR 1+ 07/09/2012 0108   BILIRUBINUR NEGATIVE 09/14/2015 2317   BILIRUBINUR Negative 07/09/2012 0108   KETONESUR NEGATIVE 09/14/2015 2317   KETONESUR Negative 07/09/2012 0108   PROTEINUR 100* 09/14/2015 2317   PROTEINUR 30 mg/dL 07/09/2012 0108   UROBILINOGEN 0.2 11/22/2012 1809   NITRITE NEGATIVE 09/14/2015 2317   NITRITE Negative 07/09/2012 0108   LEUKOCYTESUR NEGATIVE 09/14/2015 2317   LEUKOCYTESUR 1+ 07/09/2012 0108   Sepsis Labs: Invalid input(s): PROCALCITONIN, LACTICIDVEN  No results found for this or any previous visit (from the past 240 hour(s)).    Radiology Studies: Dg Chest 2 View  09/14/2015   CLINICAL DATA:  Chest pain just prior to arrival. EXAM: CHEST  2 VIEW COMPARISON:  11/22/2012 FINDINGS: Stable mediastinal contours with borderline cardiomegaly. No pulmonary edema, confluent airspace disease, pleural effusion or pneumothorax. Exaggerated thoracic kyphosis and degenerative change throughout the thoracic spine, stable. IMPRESSION: Unchanged appearance of the chest.  No acute process. Electronically Signed   By: Jeb Levering M.D.   On: 09/14/2015 23:04     Scheduled Meds: . allopurinol  100 mg Oral Daily  . amLODipine  10 mg Oral Daily  . [START ON 09/17/2015] aspirin  81 mg Oral Pre-Cath  . [START ON 09/18/2015] aspirin  325 mg Oral Daily  . atenolol  50 mg Oral Daily  . atorvastatin  80 mg Oral q1800  . heparin  5,000 Units Subcutaneous 3 times per day  . hydrALAZINE  20 mg Oral 3 times per day  .  insulin aspart  0-9 Units Subcutaneous TID WC  . insulin glargine  15 Units Subcutaneous BID  . pantoprazole  40 mg Oral Daily  . sodium chloride flush  3 mL Intravenous Q12H   Continuous Infusions: . sodium chloride 75 mL/hr at 09/16/15 0756  . sodium chloride       Marzetta Board, MD, PhD [facility] Pager [phone]  If 7PM-7AM, please contact night-coverage [email or web address] Password [facility] 09/16/2015, 11:34 AM

## 2015-09-16 NOTE — Evaluation (Signed)
Occupational Therapy Evaluation and Discharge Patient Details Name: Monica [name] MRN: [id number] DOB: 07/02/1931 Today's Date: 09/16/2015    History of Present Illness Monica [name] is a 82 y.o. female with PMH of hypertension, hyperlipidemia, diabetes mellitus, asthma, GERD, gout, chronic kidney disease-stage III, peptic ulcer disease, who presents with chest pain. For cardiac cath 09/17/2015   Clinical Impression   This 82 yo female admitted with above presents to acute OT at an independent to Mod I level. No further OT needs we will sign off.    Follow Up Recommendations  No OT follow up    Equipment Recommendations  None recommended by OT       Precautions / Restrictions Precautions Precautions: None Restrictions Weight Bearing Restrictions: No      Mobility Bed Mobility Overal bed mobility: Modified Independent             General bed mobility comments: HOB up  Transfers Overall transfer level: Independent   Transfers: Sit to/from Stand           General transfer comment: Ambulated 200 feet without AD at Independent level    Balance Overall balance assessment: No apparent balance deficits (not formally assessed)                                          ADL Overall ADL's : Modified independent                                                       Pertinent Vitals/Pain Pain Assessment: No/denies pain     Hand Dominance Right   Extremity/Trunk Assessment Upper Extremity Assessment Upper Extremity Assessment: Overall WFL for tasks assessed           Communication Communication Communication: No difficulties   Cognition Arousal/Alertness: Awake/alert Behavior During Therapy: WFL for tasks assessed/performed Overall Cognitive Status: Within Functional Limits for tasks assessed                                Home Living Family/patient expects to be discharged to:: Private residence Living  Arrangements: Children Available Help at Discharge: Family;Available 24 hours/day Type of Home: House Home Access: Stairs to enter [employer] of Steps: 3 Entrance Stairs-Rails: Right Home Layout: One level     Bathroom Shower/Tub: Tub/shower unit (pt gets down in tub)   Bathroom Toilet: Handicapped height     Home Equipment: Environmental consultant - 2 wheels          Prior Functioning/Environment Level of Independence: Independent        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 RN checking on pt's breakfast)  Activity Tolerance: Patient tolerated treatment well Patient left: in bed;with call bell/phone within reach   Time: [id number] OT Time Calculation (min): 21 min Charges:  OT  General Charges $OT Visit: 1 Procedure OT Evaluation $OT Eval Low Complexity: 1 Procedure G-Codes: OT G-codes **NOT FOR INPATIENT CLASS** Functional Assessment Tool Used: clinical observation Functional Limitation: Self care Self Care Current Status [id number]): At least 1 percent but less than 20 percent impaired, limited or restricted Self Care Goal Status [id number]): At least 1 percent but less than 20 percent impaired, limited or restricted Self Care Discharge Status [phone]): At least 1 percent but less than 20 percent impaired, limited or restricted  Miriam, Lovel [id number] 09/16/2015, 8:55 AM

## 2015-09-16 NOTE — Progress Notes (Signed)
Consulting cardiologist: Dr. Satira Sark  Seen for followup: Chest pain and dyspnea on exertion  Subjective:    No recurrent chest pain, some shortness of breath when walking with OT. No palpitations or dizziness.  Objective:   Temp:  [98 F (36.7 C)-98.2 F (36.8 C)] 98.1 F (36.7 C) (03/12 0512) Pulse Rate:  [70-72] 70 (03/12 0512) Resp:  [18] 18 (03/12 0512) BP: (135-145)/(59-65) 145/65 mmHg (03/12 0512) SpO2:  [98 %-99 %] 98 % (03/12 0512) Last BM Date: 09/14/15  [facility] Weights   09/14/15 2139 09/15/15 0227  Weight: 185 lb (83.915 kg) 186 lb 11.2 oz (84.687 kg)    Intake/Output Summary (Last 24 hours) at 09/16/15 0901 Last data filed at 09/15/15 1554  Gross per 24 hour  Intake      0 ml  Output    800 ml  Net   -800 ml    Telemetry:Sinus rhythm.  Exam:  General: Obese woman, appears comfortable at rest.  Lungs: Prolonged expiratory phase without wheezing.  Cardiac: RRR without gallop.  Abdomen: NABS.  Extremities: No pitting edema.  Lab Results:  Basic Metabolic Panel:  Recent Labs Lab 09/14/15 2230  NA 138  K 3.8  CL 101  CO2 25  GLUCOSE 180*  BUN 33*  CREATININE 1.55*  CALCIUM 8.9    CBC:  Recent Labs Lab 09/14/15 2230  WBC 8.4  HGB 11.1*  HCT 34.0*  MCV 88.1  PLT 239    Cardiac Enzymes:  Recent Labs Lab 09/15/15 0343 09/15/15 0818 09/15/15 1454  TROPONINI 0.03 <0.03 <0.03    Coagulation:  Recent Labs Lab 09/15/15 0343  INR 1.07    Chest x-ray 09/14/2015: FINDINGS: Stable mediastinal contours with borderline cardiomegaly. No pulmonary edema, confluent airspace disease, pleural effusion or pneumothorax. Exaggerated thoracic kyphosis and degenerative change throughout the thoracic spine, stable.  IMPRESSION: Unchanged appearance of the chest. No acute process.   Medications:   Scheduled Medications: . allopurinol  100 mg Oral Daily  . amLODipine  10 mg Oral Daily  . [START ON 09/17/2015]  aspirin  81 mg Oral Pre-Cath  . aspirin  325 mg Oral Daily  . [START ON 09/18/2015] aspirin  325 mg Oral Daily  . atenolol  50 mg Oral Daily  . atorvastatin  80 mg Oral q1800  . heparin  5,000 Units Subcutaneous 3 times per day  . hydrALAZINE  20 mg Oral 3 times per day  . insulin aspart  0-9 Units Subcutaneous TID WC  . insulin glargine  15 Units Subcutaneous BID  . pantoprazole  40 mg Oral Daily  . sodium chloride flush  3 mL Intravenous Q12H     Infusions: . sodium chloride 75 mL/hr at 09/16/15 0756  . sodium chloride       PRN Medications:  sodium chloride, acetaminophen, albuterol, hydrALAZINE, meclizine, morphine injection, nitroGLYCERIN, ondansetron (ZOFRAN) IV, sodium chloride flush, zolpidem   Assessment:   1. Symptoms concerning for unstable angina associated with dynamic lateral ST segment depression but negative cardiac markers. Intermediate to high risk for underlying CAD based on cardiac risk factor profile.  2. Limited episode NSVT seen on telemetry, asymptomatic. Echocardiogram pending.  3. Hyperlipidemia, on statin therapy. LDL 80.  4. Type 2 diabetes mellitus.  5. Essential hypertension.  6. CKD, stage 3. Creatinine 1.5.   Plan/Discussion:    Patient scheduled for cardiac catheterization tomorrow. Follow-up lab work including CBC and BMET with hydration ordered. No change in current regimen which includes aspirin,  Norvasc, atenolol, and Lipitor. She has been placed on hydralazine while off Cozaar around the time of angiography to reduce risk of contrast nephropathy.   Satira Sark, M.D., F.A.C.C.

## 2015-09-17 ENCOUNTER — Encounter (HOSPITAL_COMMUNITY)
Admission: EM | Disposition: A | Discharge status: Skilled Nursing Facility | Payer: Self-pay | Source: Home / Self Care | Attending: Cardiothoracic Surgery

## 2015-09-17 ENCOUNTER — Observation Stay (HOSPITAL_BASED_OUTPATIENT_CLINIC_OR_DEPARTMENT_OTHER): Payer: Medicare HMO

## 2015-09-17 DIAGNOSIS — R9431 Abnormal electrocardiogram [ECG] [EKG]: Secondary | ICD-10-CM

## 2015-09-17 DIAGNOSIS — I25110 Atherosclerotic heart disease of native coronary artery with unstable angina pectoris: Principal | ICD-10-CM

## 2015-09-17 DIAGNOSIS — E785 Hyperlipidemia, unspecified: Secondary | ICD-10-CM | POA: Diagnosis not present

## 2015-09-17 DIAGNOSIS — R079 Chest pain, unspecified: Secondary | ICD-10-CM

## 2015-09-17 DIAGNOSIS — I10 Essential (primary) hypertension: Secondary | ICD-10-CM | POA: Diagnosis not present

## 2015-09-17 DIAGNOSIS — I209 Angina pectoris, unspecified: Secondary | ICD-10-CM | POA: Insufficient documentation

## 2015-09-17 DIAGNOSIS — I200 Unstable angina: Secondary | ICD-10-CM | POA: Diagnosis present

## 2015-09-17 HISTORY — PX: CARDIAC CATHETERIZATION: SHX172

## 2015-09-17 LAB — BASIC METABOLIC PANEL
Anion gap: 11 (ref 5–15)
BUN: 21 mg/dL — AB (ref 6–20)
CALCIUM: 8.5 mg/dL — AB (ref 8.9–10.3)
CO2: 24 mmol/L (ref 22–32)
CREATININE: 1.49 mg/dL — AB (ref 0.44–1.00)
Chloride: 106 mmol/L (ref 101–111)
GFR calc Af Amer: 36 mL/min — ABNORMAL LOW (ref >60)
GFR, EST NON AFRICAN AMERICAN: 31 mL/min — AB (ref >60)
Glucose, Bld: 156 mg/dL — ABNORMAL HIGH (ref 65–99)
POTASSIUM: 4.1 mmol/L (ref 3.5–5.1)
SODIUM: 141 mmol/L (ref 135–145)

## 2015-09-17 LAB — ECHOCARDIOGRAM COMPLETE
Height: 62 in
WEIGHTICAEL: 2876.8 [oz_av]

## 2015-09-17 LAB — CBC
HCT: 33.7 % — ABNORMAL LOW (ref 36.0–46.0)
Hemoglobin: 10.7 g/dL — ABNORMAL LOW (ref 12.0–15.0)
MCH: 28.1 pg (ref 26.0–34.0)
MCHC: 31.8 g/dL (ref 30.0–36.0)
MCV: 88.5 fL (ref 78.0–100.0)
PLATELETS: 221 10*3/uL (ref 150–400)
RBC: 3.81 MIL/uL — ABNORMAL LOW (ref 3.87–5.11)
RDW: 14.6 % (ref 11.5–15.5)
WBC: 6.2 10*3/uL (ref 4.0–10.5)

## 2015-09-17 LAB — GLUCOSE, CAPILLARY
GLUCOSE-CAPILLARY: 77 mg/dL (ref 65–99)
GLUCOSE-CAPILLARY: 64 mg/dL — AB (ref 65–99)
GLUCOSE-CAPILLARY: 132 mg/dL — AB (ref 65–99)
GLUCOSE-CAPILLARY: 241 mg/dL — AB (ref 65–99)

## 2015-09-17 LAB — PROTIME-INR
INR: 1.01 (ref 0.00–1.49)
PROTHROMBIN TIME: 13.5 s (ref 11.6–15.2)

## 2015-09-17 LAB — HEMOGLOBIN A1C
HEMOGLOBIN A1C: 8.2 % — AB (ref 4.8–5.6)
MEAN PLASMA GLUCOSE: 189 mg/dL

## 2015-09-17 SURGERY — LEFT HEART CATH AND CORONARY ANGIOGRAPHY
Anesthesia: LOCAL

## 2015-09-17 MED ORDER — ALBUTEROL SULFATE HFA 108 (90 BASE) MCG/ACT IN AERS: 2.0000 | INHALATION_SPRAY | Freq: Four times a day (QID) | RESPIRATORY_TRACT | Status: DC | PRN

## 2015-09-17 MED ORDER — HEPARIN (PORCINE) IN NACL 2-0.9 UNIT/ML-% IJ SOLN
INTRAMUSCULAR | Status: DC | PRN
  Administered 2015-09-17: 10 mL via INTRA_ARTERIAL

## 2015-09-17 MED ORDER — LIDOCAINE HCL (PF) 1 % IJ SOLN
INTRAMUSCULAR | Status: AC
  Filled 2015-09-17: qty 30

## 2015-09-17 MED ORDER — SODIUM CHLORIDE 0.9 % WEIGHT BASED INFUSION: 3.0000 mL/kg/h | INTRAVENOUS | Status: AC

## 2015-09-17 MED ORDER — SODIUM CHLORIDE 0.9% FLUSH
3.0000 mL | INTRAVENOUS | Status: DC | PRN
  Administered 2015-09-18: 3 mL via INTRAVENOUS
  Filled 2015-09-17: qty 3

## 2015-09-17 MED ORDER — ISOSORBIDE MONONITRATE ER 30 MG PO TB24
30.0000 mg | ORAL_TABLET | Freq: Every day | ORAL | Status: DC
  Administered 2015-09-17 – 2015-09-18 (×2): 30 mg via ORAL
  Filled 2015-09-17 (×2): qty 1

## 2015-09-17 MED ORDER — IOHEXOL 350 MG/ML SOLN
INTRAVENOUS | Status: DC | PRN
  Administered 2015-09-17: 40 mL via INTRA_ARTERIAL

## 2015-09-17 MED ORDER — ONDANSETRON HCL 4 MG/2ML IJ SOLN: 4.0000 mg | Freq: Four times a day (QID) | INTRAMUSCULAR | Status: DC | PRN

## 2015-09-17 MED ORDER — HEPARIN SODIUM (PORCINE) 5000 UNIT/ML IJ SOLN
5000.0000 [IU] | Freq: Three times a day (TID) | INTRAMUSCULAR | Status: DC
  Administered 2015-09-18 (×3): 5000 [IU] via SUBCUTANEOUS
  Filled 2015-09-17 (×3): qty 1

## 2015-09-17 MED ORDER — HEPARIN (PORCINE) IN NACL 2-0.9 UNIT/ML-% IJ SOLN
INTRAMUSCULAR | Status: DC | PRN
  Administered 2015-09-17: 1000 mL

## 2015-09-17 MED ORDER — HEPARIN SODIUM (PORCINE) 1000 UNIT/ML IJ SOLN
INTRAMUSCULAR | Status: AC
  Filled 2015-09-17: qty 1

## 2015-09-17 MED ORDER — FENTANYL CITRATE (PF) 100 MCG/2ML IJ SOLN
INTRAMUSCULAR | Status: DC | PRN
  Administered 2015-09-17: 25 ug via INTRAVENOUS

## 2015-09-17 MED ORDER — DEXTROSE-NACL 5-0.45 % IV SOLN
INTRAVENOUS | Status: DC
  Administered 2015-09-17: 22:00:00 via INTRAVENOUS

## 2015-09-17 MED ORDER — VERAPAMIL HCL 2.5 MG/ML IV SOLN
INTRAVENOUS | Status: AC
  Filled 2015-09-17: qty 2

## 2015-09-17 MED ORDER — SODIUM CHLORIDE 0.9 % IV SOLN: 250.0000 mL | INTRAVENOUS | Status: DC | PRN

## 2015-09-17 MED ORDER — MIDAZOLAM HCL 2 MG/2ML IJ SOLN
INTRAMUSCULAR | Status: AC
  Filled 2015-09-17: qty 2

## 2015-09-17 MED ORDER — FENTANYL CITRATE (PF) 100 MCG/2ML IJ SOLN
INTRAMUSCULAR | Status: AC
  Filled 2015-09-17: qty 2

## 2015-09-17 MED ORDER — SODIUM CHLORIDE 0.9% FLUSH
3.0000 mL | Freq: Two times a day (BID) | INTRAVENOUS | Status: DC
  Administered 2015-09-18 (×2): 3 mL via INTRAVENOUS

## 2015-09-17 MED ORDER — LIDOCAINE HCL (PF) 1 % IJ SOLN
INTRAMUSCULAR | Status: DC | PRN
  Administered 2015-09-17: 3 mL via INTRADERMAL

## 2015-09-17 MED ORDER — ASPIRIN 81 MG PO CHEW
81.0000 mg | CHEWABLE_TABLET | Freq: Every day | ORAL | Status: DC
  Administered 2015-09-18: 81 mg via ORAL
  Filled 2015-09-17: qty 1

## 2015-09-17 MED ORDER — HEPARIN (PORCINE) IN NACL 2-0.9 UNIT/ML-% IJ SOLN
INTRAMUSCULAR | Status: AC
  Filled 2015-09-17: qty 1000

## 2015-09-17 MED ORDER — METOPROLOL TARTRATE 12.5 MG HALF TABLET
12.5000 mg | ORAL_TABLET | Freq: Two times a day (BID) | ORAL | Status: DC
  Administered 2015-09-17 – 2015-09-18 (×3): 12.5 mg via ORAL
  Filled 2015-09-17 (×3): qty 1

## 2015-09-17 MED ORDER — METOPROLOL TARTRATE 12.5 MG HALF TABLET: 12.5000 mg | ORAL_TABLET | Freq: Two times a day (BID) | ORAL | Status: DC

## 2015-09-17 MED ORDER — HEPARIN SODIUM (PORCINE) 1000 UNIT/ML IJ SOLN
INTRAMUSCULAR | Status: DC | PRN
  Administered 2015-09-17: 4000 [IU] via INTRAVENOUS

## 2015-09-17 MED ORDER — MIDAZOLAM HCL 2 MG/2ML IJ SOLN
INTRAMUSCULAR | Status: DC | PRN
  Administered 2015-09-17: 1 mg via INTRAVENOUS

## 2015-09-17 MED ORDER — ACETAMINOPHEN 325 MG PO TABS: 650.0000 mg | ORAL_TABLET | ORAL | Status: DC | PRN

## 2015-09-17 SURGICAL SUPPLY — 9 items
CATH INFINITI 5 FR JL3.5 (CATHETERS) ×2 IMPLANT
CATH INFINITI JR4 5F (CATHETERS) ×2 IMPLANT
DEVICE RAD COMP TR BAND LRG (VASCULAR PRODUCTS) ×2 IMPLANT
GLIDESHEATH SLEND SS 6F .021 (SHEATH) ×2 IMPLANT
KIT HEART LEFT (KITS) ×2 IMPLANT
PACK CARDIAC CATHETERIZATION (CUSTOM PROCEDURE TRAY) ×2 IMPLANT
TRANSDUCER W/STOPCOCK (MISCELLANEOUS) ×2 IMPLANT
TUBING CIL FLEX 10 FLL-RA (TUBING) ×2 IMPLANT
WIRE SAFE-T 1.5MM-J .035X260CM (WIRE) ×2 IMPLANT

## 2015-09-17 NOTE — Progress Notes (Signed)
PT Cancellation Note  Patient Details Name: Monica [name] MRN: [id number] DOB: 1931-05-10   Cancelled Treatment:    Reason Eval/Treat Not Completed: Patient not medically ready Pt going down for cardiac cath today. Will follow up after as time allows.   Marguarite Arbour A Calahan Pak 09/17/2015, 10:18 AM Wray Kearns, PT, DPT [phone]

## 2015-09-17 NOTE — H&P (View-Only) (Signed)
Consulting cardiologist: Dr. Satira Sark  Seen for followup: Chest pain and dyspnea on exertion  Subjective:    No recurrent chest pain, some shortness of breath when walking with OT. No palpitations or dizziness.  Marland Kitchen amLODipine  10 mg Oral Daily  . [START ON 09/18/2015] aspirin  325 mg Oral Daily  . atorvastatin  80 mg Oral q1800  . heparin  5,000 Units Subcutaneous 3 times per day  . insulin aspart  0-9 Units Subcutaneous TID WC  . insulin glargine  15 Units Subcutaneous BID  . [START ON 09/18/2015] metoprolol tartrate  12.5 mg Oral BID  . pantoprazole  40 mg Oral Daily  . sodium chloride flush  3 mL Intravenous Q12H   Objective:   Temp:  [98.4 F (36.9 C)-98.7 F (37.1 C)] 98.4 F (36.9 C) (03/13 0449) Pulse Rate:  [64-68] 68 (03/13 1014) Resp:  [16-18] 16 (03/13 0449) BP: (123-177)/(55-74) 177/66 mmHg (03/13 1014) SpO2:  [97 %-100 %] 100 % (03/13 0449) Weight:  [179 lb 12.8 oz (81.557 kg)] 179 lb 12.8 oz (81.557 kg) (03/13 0449) Last BM Date: 09/16/15  Filed Weights   09/14/15 2139 09/15/15 0227 09/17/15 0449  Weight: 185 lb (83.915 kg) 186 lb 11.2 oz (84.687 kg) 179 lb 12.8 oz (81.557 kg)    Intake/Output Summary (Last 24 hours) at 09/17/15 1134 Last data filed at 09/16/15 1826  Gross per 24 hour  Intake    240 ml  Output    500 ml  Net   -260 ml    Telemetry:Sinus rhythm.  Exam:  General: Obese woman, appears comfortable at rest.  Lungs: Prolonged expiratory phase without wheezing.  Cardiac: RRR without gallop.  Abdomen: NABS.  Extremities: No pitting edema.  Lab Results:  Basic Metabolic Panel:  Recent Labs Lab 09/14/15 2230 09/17/15 0001  NA 138 141  K 3.8 4.1  CL 101 106  CO2 25 24  GLUCOSE 180* 156*  BUN 33* 21*  CREATININE 1.55* 1.49*  CALCIUM 8.9 8.5*    CBC:  Recent Labs Lab 09/14/15 2230 09/17/15 0001  WBC 8.4 6.2  HGB 11.1* 10.7*  HCT 34.0* 33.7*  MCV 88.1 88.5  PLT 239 221    Cardiac Enzymes:  Recent  Labs Lab 09/15/15 0343 09/15/15 0818 09/15/15 1454  TROPONINI 0.03 <0.03 <0.03    Coagulation:  Recent Labs Lab 09/15/15 0343 09/17/15 0001  INR 1.07 1.01    Chest x-ray 09/14/2015: FINDINGS: Stable mediastinal contours with borderline cardiomegaly. No pulmonary edema, confluent airspace disease, pleural effusion or pneumothorax. Exaggerated thoracic kyphosis and degenerative change throughout the thoracic spine, stable.  IMPRESSION: Unchanged appearance of the chest. No acute process.   Medications:   Scheduled Medications: . allopurinol  100 mg Oral Daily  . amLODipine  10 mg Oral Daily  . [START ON 09/18/2015] aspirin  325 mg Oral Daily  . atenolol  50 mg Oral Daily  . atorvastatin  80 mg Oral q1800  . heparin  5,000 Units Subcutaneous 3 times per day  . hydrALAZINE  20 mg Oral 3 times per day  . insulin aspart  0-9 Units Subcutaneous TID WC  . insulin glargine  15 Units Subcutaneous BID  . pantoprazole  40 mg Oral Daily  . sodium chloride flush  3 mL Intravenous Q12H    Infusions: . sodium chloride 75 mL/hr at 09/16/15 0756  . sodium chloride 1 mL/kg/hr (09/16/15 2329)    PRN Medications: sodium chloride, acetaminophen, albuterol, hydrALAZINE, meclizine, morphine  injection, nitroGLYCERIN, ondansetron (ZOFRAN) IV, sodium chloride flush, zolpidem   Assessment:   1. Symptoms concerning for unstable angina associated with dynamic lateral ST segment depression but negative cardiac markers. Intermediate to high risk for underlying CAD based on cardiac risk factor profile. Patient scheduled for cardiac catheterization today. Follow-up lab work including CBC and BMET with hydration ordered. No change in current regimen which includes aspirin, Norvasc, atenolol, and Lipitor. She has been placed on hydralazine while off Cozaar around the time of angiography to reduce risk of contrast nephropathy.  2. Limited episode NSVT seen on telemetry, asymptomatic.  Echocardiogram pending.  3. Hyperlipidemia, on statin therapy. LDL 80.  4. Type 2 diabetes mellitus.  5. Essential hypertension. Labile, we will add imdur 30 mg po daily.  6. CKD, stage 3. Creatinine 1.5.   Dorothy Spark 09/17/2015

## 2015-09-17 NOTE — Progress Notes (Signed)
Echocardiogram 2D Echocardiogram has been performed.  Joelene Millin 09/17/2015, 2:14 PM

## 2015-09-17 NOTE — Care Management Obs Status (Signed)
[place] NOTIFICATION   Patient Details  Name: Monica [name] MRN: [id number] Date of Birth: 1930/07/08   Medicare Observation Status Notification Given:  Yes    Dawayne Patricia, RN 09/17/2015, 10:44 AM

## 2015-09-17 NOTE — Progress Notes (Signed)
PROGRESS NOTE  Monica [name] [id number] DOB: 1930-07-17 DOA: 09/14/2015 PCP: Benito Mccreedy, MD Outpatient Specialists:      Brief Narrative: 82 y.o. female with PMH of hypertension, hyperlipidemia, diabetes mellitus, asthma, GERD, gout, chronic kidney disease-stage III, peptic ulcer disease, who presented with chest pain  Assessment & Plan: Principal Problem:   Unstable angina ([place]) Active Problems:   Accelerated hypertension   Hyperlipidemia   Diabetes mellitus with renal complications ([place])   Asthma   CKD (chronic kidney disease), stage III   GERD (gastroesophageal reflux disease)   Gout   Chest pain   Abnormal finding on EKG - dynamic changes   Chest pain  - cardiology consulted, appreciate input.  - concerning for cardiac etiology given relationship with exertion and resolution with rest.  - cardiac enzymes negative 3 - Plan for cardiac catheterization today  Accelerated hypertension - Initial blood pressure 190/77.Blood pressure improved after being treated with nitroglycerin patch in the emergency room. - improved, continue Norvasc,  - family today insisting patient back on metoprolol and doesn't want hydralazine. Changes made  HLD - Continue home medications: Lipitor - LDL 80, HDL 35.  DM-II  - Last A1c not on record. Patient is taking that has, sliding-scale insulin, Amaryl, rosiglitazone at home. Per Epic records, patient supposed to take 100 units of Lantus twice a day, but patient strongly insists that she is taking 20 units Lantus twice a day at home. - will decrease Lantus dose from 20 units to 15 units twice a day  - SSI - A1c 8.2  Asthma  - Stable - When necessary albuterol nebulizer  CKD (chronic kidney disease), stage III  - Stable. Previous creatinine was 1.70 on 11/22/12. Her creatinine is 1.55, BUN 33 today. - Follow-up renal function by BMP - hold home lasix in light of cath today, resume in am if renal function stable  GERD: -  Protonix  Gout - continue home allopurinol   DVT prophylaxis: heparin Code Status: Full Family Communication: d/w daughter bedside Disposition Plan: home when ready  Barriers for discharge: cath today  Consultants:   Cardiology   Procedures:   2D echo: pending  Antimicrobials:  None    Subjective: - no chest pain, no abdominal pain, no nausea/vomiting  Objective: Filed Vitals:   09/16/15 1339 09/16/15 2045 09/17/15 0449 09/17/15 1014  BP: 123/74 145/61 163/55 177/66  Pulse: 64 67 65 68  Temp: 98.7 F (37.1 C) 98.6 F (37 C) 98.4 F (36.9 C)   TempSrc: Oral Oral Oral   Resp: 17 18 16    Height:      Weight:   81.557 kg (179 lb 12.8 oz)   SpO2: 97% 99% 100%     Intake/Output Summary (Last 24 hours) at 09/17/15 1139 Last data filed at 09/16/15 1826  Gross per 24 hour  Intake    240 ml  Output    500 ml  Net   -260 ml   Filed Weights   09/14/15 2139 09/15/15 0227 09/17/15 0449  Weight: 83.915 kg (185 lb) 84.687 kg (186 lb 11.2 oz) 81.557 kg (179 lb 12.8 oz)    Examination: BP 177/66 mmHg  Pulse 68  Temp(Src) 98.4 F (36.9 C) (Oral)  Resp 16  Ht 5\' 2"  (1.575 m)  Wt 81.557 kg (179 lb 12.8 oz)  BMI 32.88 kg/m2  SpO2 100%  GENERAL: NAD  HEENT: head NCAT, no scleral icterus.   NECK: Supple. No carotid bruits. No lymphadenopathy or thyromegaly.  LUNGS: Clear to auscultation.  No wheezing or crackles  HEART: Regular rate and rhythm without murmur. 2+ pulses, no JVD, no peripheral edema  ABDOMEN: Soft, nontender, and nondistended. Positive bowel sounds.    Data Reviewed: I have personally reviewed following labs and imaging studies  CBC:  Recent Labs Lab 09/14/15 2230 09/17/15 0001  WBC 8.4 6.2  HGB 11.1* 10.7*  HCT 34.0* 33.7*  MCV 88.1 88.5  PLT 239 [id number]   Basic Metabolic Panel:  Recent Labs Lab 09/14/15 2230 09/17/15 0001  NA 138 141  K 3.8 4.1  CL 101 106  CO2 25 24  GLUCOSE 180* 156*  BUN 33* 21*  CREATININE 1.55* 1.49*    CALCIUM 8.9 8.5*   GFR: Estimated Creatinine Clearance: 28.3 mL/min (by C-G formula based on Cr of 1.49). Liver Function Tests: No results for input(s): AST, ALT, ALKPHOS, BILITOT, PROT, ALBUMIN in the last 168 hours. No results for input(s): LIPASE, AMYLASE in the last 168 hours. No results for input(s): AMMONIA in the last 168 hours. Coagulation Profile:  Recent Labs Lab 09/15/15 0343 09/17/15 0001  INR 1.07 1.01   Cardiac Enzymes:  Recent Labs Lab 09/14/15 2230 09/15/15 0343 09/15/15 0818 09/15/15 1454  TROPONINI <0.03 0.03 <0.03 <0.03   BNP (last 3 results) No results for input(s): PROBNP in the last 8760 hours. HbA1C:  Recent Labs  09/15/15 0353  HGBA1C 8.2*   CBG:  Recent Labs Lab 09/16/15 1304 09/16/15 1613 09/16/15 2118 09/17/15 0623 09/17/15 1128  GLUCAP 216* 131* 191* 77 64*   Lipid Profile:  Recent Labs  09/15/15 0343  CHOL 139  HDL 35*  LDLCALC 80  TRIG 119  CHOLHDL 4.0   Thyroid Function Tests: No results for input(s): TSH, T4TOTAL, FREET4, T3FREE, THYROIDAB in the last 72 hours. Anemia Panel: No results for input(s): VITAMINB12, FOLATE, FERRITIN, TIBC, IRON, RETICCTPCT in the last 72 hours. Urine analysis:    Component Value Date/Time   COLORURINE YELLOW 09/14/2015 2317   COLORURINE Straw 07/09/2012 0108   APPEARANCEUR CLEAR 09/14/2015 2317   APPEARANCEUR Hazy 07/09/2012 0108   LABSPEC 1.007 09/14/2015 2317   LABSPEC 1.010 07/09/2012 0108   PHURINE 6.5 09/14/2015 2317   PHURINE 5.0 07/09/2012 0108   GLUCOSEU NEGATIVE 09/14/2015 2317   GLUCOSEU >=500 07/09/2012 0108   HGBUR SMALL* 09/14/2015 2317   HGBUR 1+ 07/09/2012 0108   BILIRUBINUR NEGATIVE 09/14/2015 2317   BILIRUBINUR Negative 07/09/2012 0108   KETONESUR NEGATIVE 09/14/2015 2317   KETONESUR Negative 07/09/2012 0108   PROTEINUR 100* 09/14/2015 2317   PROTEINUR 30 mg/dL 07/09/2012 0108   UROBILINOGEN 0.2 11/22/2012 1809   NITRITE NEGATIVE 09/14/2015 2317   NITRITE  Negative 07/09/2012 0108   LEUKOCYTESUR NEGATIVE 09/14/2015 2317   LEUKOCYTESUR 1+ 07/09/2012 0108   Sepsis Labs: Invalid input(s): PROCALCITONIN, LACTICIDVEN  No results found for this or any previous visit (from the past 240 hour(s)).    Radiology Studies: No results found.   Scheduled Meds: . amLODipine  10 mg Oral Daily  . [START ON 09/18/2015] aspirin  325 mg Oral Daily  . atorvastatin  80 mg Oral q1800  . heparin  5,000 Units Subcutaneous 3 times per day  . insulin aspart  0-9 Units Subcutaneous TID WC  . insulin glargine  15 Units Subcutaneous BID  . [START ON 09/18/2015] metoprolol tartrate  12.5 mg Oral BID  . pantoprazole  40 mg Oral Daily  . sodium chloride flush  3 mL Intravenous Q12H   Continuous Infusions: . sodium chloride 75 mL/hr at  09/16/15 0756  . sodium chloride 1 mL/kg/hr (09/16/15 2329)  . dextrose 5 % and 0.45% NaCl       Marzetta Board, MD, PhD [facility] Pager [phone]  If 7PM-7AM, please contact night-coverage [email or web address] Password [facility] 09/17/2015, 11:39 AM

## 2015-09-17 NOTE — Interval H&P Note (Signed)
Cath Lab Visit (complete for each Cath Lab visit)  Clinical Evaluation Leading to the Procedure:   ACS: Yes.    Non-ACS:    Anginal Classification: CCS IV  Anti-ischemic medical therapy: Minimal Therapy (1 class of medications)  Non-Invasive Test Results: No non-invasive testing performed  Prior CABG: No previous CABG      History and Physical Interval Note:  09/17/2015 4:18 PM  Monica [name]  has presented today for surgery, with the diagnosis of unstable angina  The various methods of treatment have been discussed with the patient and family. After consideration of risks, benefits and other options for treatment, the patient has consented to  Procedure(s): Left Heart Cath and Coronary Angiography (N/A) as a surgical intervention .  The patient's history has been reviewed, patient examined, no change in status, stable for surgery.  I have reviewed the patient's chart and labs.  Questions were answered to the patient's satisfaction.     Monica [name] S.

## 2015-09-17 NOTE — Progress Notes (Signed)
Consulting cardiologist: Dr. Satira Sark  Seen for followup: Chest pain and dyspnea on exertion  Subjective:    No recurrent chest pain, some shortness of breath when walking with OT. No palpitations or dizziness.  Marland Kitchen amLODipine  10 mg Oral Daily  . [START ON 09/18/2015] aspirin  325 mg Oral Daily  . atorvastatin  80 mg Oral q1800  . heparin  5,000 Units Subcutaneous 3 times per day  . insulin aspart  0-9 Units Subcutaneous TID WC  . insulin glargine  15 Units Subcutaneous BID  . [START ON 09/18/2015] metoprolol tartrate  12.5 mg Oral BID  . pantoprazole  40 mg Oral Daily  . sodium chloride flush  3 mL Intravenous Q12H   Objective:   Temp:  [98.4 F (36.9 C)-98.7 F (37.1 C)] 98.4 F (36.9 C) (03/13 0449) Pulse Rate:  [64-68] 68 (03/13 1014) Resp:  [16-18] 16 (03/13 0449) BP: (123-177)/(55-74) 177/66 mmHg (03/13 1014) SpO2:  [97 %-100 %] 100 % (03/13 0449) Weight:  [179 lb 12.8 oz (81.557 kg)] 179 lb 12.8 oz (81.557 kg) (03/13 0449) Last BM Date: 09/16/15  Filed Weights   09/14/15 2139 09/15/15 0227 09/17/15 0449  Weight: 185 lb (83.915 kg) 186 lb 11.2 oz (84.687 kg) 179 lb 12.8 oz (81.557 kg)    Intake/Output Summary (Last 24 hours) at 09/17/15 1134 Last data filed at 09/16/15 1826  Gross per 24 hour  Intake    240 ml  Output    500 ml  Net   -260 ml    Telemetry:Sinus rhythm.  Exam:  General: Obese woman, appears comfortable at rest.  Lungs: Prolonged expiratory phase without wheezing.  Cardiac: RRR without gallop.  Abdomen: NABS.  Extremities: No pitting edema.  Lab Results:  Basic Metabolic Panel:  Recent Labs Lab 09/14/15 2230 09/17/15 0001  NA 138 141  K 3.8 4.1  CL 101 106  CO2 25 24  GLUCOSE 180* 156*  BUN 33* 21*  CREATININE 1.55* 1.49*  CALCIUM 8.9 8.5*    CBC:  Recent Labs Lab 09/14/15 2230 09/17/15 0001  WBC 8.4 6.2  HGB 11.1* 10.7*  HCT 34.0* 33.7*  MCV 88.1 88.5  PLT 239 221    Cardiac Enzymes:  Recent  Labs Lab 09/15/15 0343 09/15/15 0818 09/15/15 1454  TROPONINI 0.03 <0.03 <0.03    Coagulation:  Recent Labs Lab 09/15/15 0343 09/17/15 0001  INR 1.07 1.01    Chest x-ray 09/14/2015: FINDINGS: Stable mediastinal contours with borderline cardiomegaly. No pulmonary edema, confluent airspace disease, pleural effusion or pneumothorax. Exaggerated thoracic kyphosis and degenerative change throughout the thoracic spine, stable.  IMPRESSION: Unchanged appearance of the chest. No acute process.   Medications:   Scheduled Medications: . allopurinol  100 mg Oral Daily  . amLODipine  10 mg Oral Daily  . [START ON 09/18/2015] aspirin  325 mg Oral Daily  . atenolol  50 mg Oral Daily  . atorvastatin  80 mg Oral q1800  . heparin  5,000 Units Subcutaneous 3 times per day  . hydrALAZINE  20 mg Oral 3 times per day  . insulin aspart  0-9 Units Subcutaneous TID WC  . insulin glargine  15 Units Subcutaneous BID  . pantoprazole  40 mg Oral Daily  . sodium chloride flush  3 mL Intravenous Q12H    Infusions: . sodium chloride 75 mL/hr at 09/16/15 0756  . sodium chloride 1 mL/kg/hr (09/16/15 2329)    PRN Medications: sodium chloride, acetaminophen, albuterol, hydrALAZINE, meclizine, morphine  injection, nitroGLYCERIN, ondansetron (ZOFRAN) IV, sodium chloride flush, zolpidem   Assessment:   1. Symptoms concerning for unstable angina associated with dynamic lateral ST segment depression but negative cardiac markers. Intermediate to high risk for underlying CAD based on cardiac risk factor profile. Patient scheduled for cardiac catheterization today. Follow-up lab work including CBC and BMET with hydration ordered. No change in current regimen which includes aspirin, Norvasc, atenolol, and Lipitor. She has been placed on hydralazine while off Cozaar around the time of angiography to reduce risk of contrast nephropathy.  2. Limited episode NSVT seen on telemetry, asymptomatic.  Echocardiogram pending.  3. Hyperlipidemia, on statin therapy. LDL 80.  4. Type 2 diabetes mellitus.  5. Essential hypertension. Labile, we will add imdur 30 mg po daily.  6. CKD, stage 3. Creatinine 1.5.   Monica [name] 09/17/2015

## 2015-09-17 NOTE — Progress Notes (Signed)
Received pt. From cath lab TR-Band with 15cc placed at 1640. VS taken. Pilar Corrales 5:10 PM

## 2015-09-18 ENCOUNTER — Observation Stay (HOSPITAL_BASED_OUTPATIENT_CLINIC_OR_DEPARTMENT_OTHER): Payer: Medicare HMO

## 2015-09-18 ENCOUNTER — Observation Stay (HOSPITAL_COMMUNITY): Payer: Medicare HMO

## 2015-09-18 ENCOUNTER — Other Ambulatory Visit: Payer: Self-pay | Admitting: *Deleted

## 2015-09-18 ENCOUNTER — Encounter (HOSPITAL_COMMUNITY): Payer: Self-pay | Admitting: Interventional Cardiology

## 2015-09-18 ENCOUNTER — Ambulatory Visit (HOSPITAL_COMMUNITY): Payer: Medicare HMO

## 2015-09-18 DIAGNOSIS — I2510 Atherosclerotic heart disease of native coronary artery without angina pectoris: Secondary | ICD-10-CM

## 2015-09-18 DIAGNOSIS — R52 Pain, unspecified: Secondary | ICD-10-CM | POA: Diagnosis not present

## 2015-09-18 DIAGNOSIS — N184 Chronic kidney disease, stage 4 (severe): Secondary | ICD-10-CM | POA: Diagnosis not present

## 2015-09-18 LAB — PULMONARY FUNCTION TEST
DL/VA % pred: 134 %
DL/VA: 6.11 ml/min/mmHg/L
DLCO cor % pred: 68 %
DLCO cor: 14.75 ml/min/mmHg
DLCO unc % pred: 58 %
DLCO unc: 12.68 ml/min/mmHg
FEF 25-75 Post: 0.85 L/sec
FEF 25-75 Pre: 0.29 L/sec
FEF2575-%Change-Post: 194 %
FEF2575-%Pred-Post: 80 %
FEF2575-%Pred-Pre: 27 %
FEV1-%Change-Post: 46 %
FEV1-%Pred-Post: 53 %
FEV1-%Pred-Pre: 36 %
FEV1-Post: 0.7 L
FEV1-Pre: 0.48 L
FEV1FVC-%Change-Post: -1 %
FEV1FVC-%Pred-Pre: 86 %
FEV6-%Change-Post: 48 %
FEV6-%Pred-Post: 68 %
FEV6-%Pred-Pre: 45 %
FEV6-Post: 1.09 L
FEV6-Pre: 0.74 L
FEV6FVC-%Pred-Post: 105 %
FEV6FVC-%Pred-Pre: 105 %
FVC-%Change-Post: 48 %
FVC-%Pred-Post: 64 %
FVC-%Pred-Pre: 43 %
FVC-Post: 1.09 L
FVC-Pre: 0.74 L
Post FEV1/FVC ratio: 64 %
Post FEV6/FVC ratio: 100 %
Pre FEV1/FVC ratio: 65 %
Pre FEV6/FVC Ratio: 100 %
RV % pred: 119 %
RV: 2.82 L
TLC % pred: 85 %
TLC: 4.05 L

## 2015-09-18 LAB — GLUCOSE, CAPILLARY
GLUCOSE-CAPILLARY: 102 mg/dL — AB (ref 65–99)
GLUCOSE-CAPILLARY: 153 mg/dL — AB (ref 65–99)
GLUCOSE-CAPILLARY: 120 mg/dL — AB (ref 65–99)
Glucose-Capillary: 166 mg/dL — ABNORMAL HIGH (ref 65–99)

## 2015-09-18 LAB — BLOOD GAS, ARTERIAL
Acid-Base Excess: 1.4 mmol/L (ref 0.0–2.0)
Bicarbonate: 25.7 mEq/L — ABNORMAL HIGH (ref 20.0–24.0)
Drawn by: 252031
O2 Saturation: 93.5 %
Patient temperature: 98.6
TCO2: 27 mmol/L (ref 0–100)
pCO2 arterial: 42.3 mmHg (ref 35.0–45.0)
pH, Arterial: 7.401 (ref 7.350–7.450)
pO2, Arterial: 71.3 mmHg — ABNORMAL LOW (ref 80.0–100.0)

## 2015-09-18 LAB — BASIC METABOLIC PANEL
Anion gap: 9 (ref 5–15)
BUN: 20 mg/dL (ref 6–20)
CHLORIDE: 106 mmol/L (ref 101–111)
CO2: 24 mmol/L (ref 22–32)
CREATININE: 1.48 mg/dL — AB (ref 0.44–1.00)
Calcium: 8.1 mg/dL — ABNORMAL LOW (ref 8.9–10.3)
GFR calc Af Amer: 37 mL/min — ABNORMAL LOW (ref >60)
GFR calc non Af Amer: 32 mL/min — ABNORMAL LOW (ref >60)
GLUCOSE: 196 mg/dL — AB (ref 65–99)
POTASSIUM: 4.1 mmol/L (ref 3.5–5.1)
SODIUM: 139 mmol/L (ref 135–145)

## 2015-09-18 LAB — CBC
HCT: 30.9 % — ABNORMAL LOW (ref 36.0–46.0)
HEMOGLOBIN: 9.6 g/dL — AB (ref 12.0–15.0)
MCH: 27.4 pg (ref 26.0–34.0)
MCHC: 31.1 g/dL (ref 30.0–36.0)
MCV: 88 fL (ref 78.0–100.0)
Platelets: 212 10*3/uL (ref 150–400)
RBC: 3.51 MIL/uL — AB (ref 3.87–5.11)
RDW: 14.7 % (ref 11.5–15.5)
WBC: 7.3 10*3/uL (ref 4.0–10.5)

## 2015-09-18 LAB — ABO/RH: ABO/RH(D): O POS

## 2015-09-18 MED ORDER — CHLORHEXIDINE GLUCONATE CLOTH 2 % EX PADS
6.0000 | MEDICATED_PAD | Freq: Once | CUTANEOUS | Status: AC
  Administered 2015-09-19: 6 via TOPICAL

## 2015-09-18 MED ORDER — BISACODYL 5 MG PO TBEC
5.0000 mg | DELAYED_RELEASE_TABLET | Freq: Once | ORAL | Status: AC
  Administered 2015-09-18: 5 mg via ORAL
  Filled 2015-09-18: qty 1

## 2015-09-18 MED ORDER — PLASMA-LYTE 148 IV SOLN
INTRAVENOUS | Status: DC
  Filled 2015-09-18: qty 2.5

## 2015-09-18 MED ORDER — TEMAZEPAM 15 MG PO CAPS: 15.0000 mg | ORAL_CAPSULE | Freq: Once | ORAL | Status: DC | PRN

## 2015-09-18 MED ORDER — SODIUM CHLORIDE 0.9 % IV SOLN
1250.0000 mg | INTRAVENOUS | Status: DC
  Filled 2015-09-18: qty 1250

## 2015-09-18 MED ORDER — DOPAMINE-DEXTROSE 3.2-5 MG/ML-% IV SOLN
0.0000 ug/kg/min | INTRAVENOUS | Status: DC
  Filled 2015-09-18: qty 250

## 2015-09-18 MED ORDER — MAGNESIUM SULFATE 50 % IJ SOLN
40.0000 meq | INTRAMUSCULAR | Status: DC
  Filled 2015-09-18: qty 10

## 2015-09-18 MED ORDER — NITROGLYCERIN IN D5W 200-5 MCG/ML-% IV SOLN
2.0000 ug/min | INTRAVENOUS | Status: DC
  Filled 2015-09-18: qty 250

## 2015-09-18 MED ORDER — INSULIN REGULAR HUMAN 100 UNIT/ML IJ SOLN
INTRAMUSCULAR | Status: DC
  Filled 2015-09-18: qty 2.5

## 2015-09-18 MED ORDER — CHLORHEXIDINE GLUCONATE 0.12 % MT SOLN
15.0000 mL | Freq: Once | OROMUCOSAL | Status: AC
  Administered 2015-09-19: 15 mL via OROMUCOSAL
  Filled 2015-09-18: qty 15

## 2015-09-18 MED ORDER — ISOSORBIDE MONONITRATE ER 30 MG PO TB24: 60.0000 mg | ORAL_TABLET | Freq: Every day | ORAL | Status: DC

## 2015-09-18 MED ORDER — DEXTROSE 5 % IV SOLN
1.5000 g | INTRAVENOUS | Status: DC
  Filled 2015-09-18: qty 1.5

## 2015-09-18 MED ORDER — ALBUTEROL SULFATE (2.5 MG/3ML) 0.083% IN NEBU
2.5000 mg | INHALATION_SOLUTION | Freq: Once | RESPIRATORY_TRACT | Status: AC
  Administered 2015-09-18: 2.5 mg via RESPIRATORY_TRACT

## 2015-09-18 MED ORDER — AMINOCAPROIC ACID 250 MG/ML IV SOLN
INTRAVENOUS | Status: DC
  Filled 2015-09-18: qty 40

## 2015-09-18 MED ORDER — POTASSIUM CHLORIDE 2 MEQ/ML IV SOLN
80.0000 meq | INTRAVENOUS | Status: DC
  Filled 2015-09-18: qty 40

## 2015-09-18 MED ORDER — CHLORHEXIDINE GLUCONATE CLOTH 2 % EX PADS
6.0000 | MEDICATED_PAD | Freq: Once | CUTANEOUS | Status: AC
  Administered 2015-09-18: 6 via TOPICAL

## 2015-09-18 MED ORDER — METOPROLOL TARTRATE 12.5 MG HALF TABLET
12.5000 mg | ORAL_TABLET | Freq: Once | ORAL | Status: AC
  Administered 2015-09-19: 12.5 mg via ORAL
  Filled 2015-09-18: qty 1

## 2015-09-18 MED ORDER — CEFUROXIME SODIUM 750 MG IJ SOLR
750.0000 mg | INTRAMUSCULAR | Status: DC
  Filled 2015-09-18: qty 750

## 2015-09-18 MED ORDER — FUROSEMIDE 20 MG PO TABS
20.0000 mg | ORAL_TABLET | Freq: Every day | ORAL | Status: DC
  Administered 2015-09-18: 20 mg via ORAL
  Filled 2015-09-18: qty 1

## 2015-09-18 MED ORDER — DEXMEDETOMIDINE HCL IN NACL 400 MCG/100ML IV SOLN
0.1000 ug/kg/h | INTRAVENOUS | Status: DC
  Filled 2015-09-18: qty 100

## 2015-09-18 MED ORDER — SODIUM CHLORIDE 0.9 % IV SOLN
INTRAVENOUS | Status: DC
  Filled 2015-09-18: qty 30

## 2015-09-18 MED ORDER — PHENYLEPHRINE HCL 10 MG/ML IJ SOLN
30.0000 ug/min | INTRAVENOUS | Status: DC
  Filled 2015-09-18: qty 2

## 2015-09-18 MED ORDER — EPINEPHRINE HCL 1 MG/ML IJ SOLN
0.0000 ug/min | INTRAVENOUS | Status: DC
  Filled 2015-09-18: qty 4

## 2015-09-18 NOTE — Consult Note (Signed)
Monica [name]       [place] [address]             [phone]        Kimley Mesenbrink [facility] Medical Record [id number] Date of Birth: [birth year]  Referring: Dr.Varanasi and Dr. Tamala Julian Primary Care: Benito Mccreedy, MD  Chief Complaint:    Chief Complaint  Patient presents with  . Chest Pain with exertion Unstable angina   Reason for consultation: Multivessel coronary artery disease  History of Present Illness:     This is an 82 year old female with a past medical history of hypertension, hyperlipidemia, diabetes mellitus, asthma, GERD, gout, chronic kidney disease-stage III, peptic ulcer disease, who presents with exertional chest pain.  She has been having intermittent chest pain for about 2 months. The chest pain happens 2 or 3 times per week. Each time, it lasts for about 20-30 minutes, then resolved spontaneously. Sometimes she did take "GI medication" that would also alleviate her symptoms. She had another episode of chest pain on 09/15/2015 at about 6:00PM. It was located in the substernal area, severe, sharp, nonradiating. She states that her chest pain is exertional, walking make her chest pain worse. Patient does not have cough, SOB, fever, chills. She has leg pain behind knee joint, but no tenderness over calf areas. Patient does not have abdominal pain, diarrhea, symptoms of UTI, nausea, vomiting, unilateral weakness. She was found to have elevated blood pressure at 190/77 in the emergency room.  In ED, she was found Troponin I was negative and EKG showed nonspecific ST and T wave abnormality. She underwent cardiac catheterization by Dr. Irish Lack on 09/17/2015.  Marland KitchenShe was found to have severe 3 vessel coronary artery disease. Consult called to office today for the consideration for coronary artery bypass grafting surgery.   Current Activity/ Functional Status: Patient is independent with mobility/ambulation, transfers, ADL's, IADL's.   Zubrod Score: At  the time of surgery this patient's most appropriate activity status/level should be described as: []     0    Normal activity, no symptoms []     1    Restricted in physical strenuous activity but ambulatory, able to do out light work [x]     2    Ambulatory and capable of self care, unable to do work activities, up and about more than 50%  Of the time                            []     3    Only limited self care, in bed greater than 50% of waking hours []     4    Completely disabled, no self care, confined to bed or chair []     5    Moribund  Past Medical History  Diagnosis Date  . Peptic ulcer   . Mass   . Essential hypertension   . Hyperlipidemia   . Type 2 diabetes mellitus ([place])   . Asthma   . History of blood transfusion   . CKD (chronic kidney disease), stage III     Past Surgical History  Procedure Laterality Date  . Abdominal hysterectomy    . Colon surgery    . Cardiac catheterization N/A 09/17/2015    Procedure: Left Heart Cath and Coronary Angiography;  Surgeon: Jettie Booze, MD;  Location: [facility];  Service: Cardiovascular;  Laterality: N/A;    Social History   Social  History  . Marital Status: Widowed    Spouse Name: N/A  . Number of Children: N/A  . Years of Education: N/A   Occupational History  . Retired    Social History Main Topics  . Smoking status: Never Smoker   . Smokeless tobacco: Former Systems developer    Types: Snuff  . Alcohol Use: No  . Drug Use: No  . Sexual Activity: Not on file    Allergies: No Known Allergies  Current Facility-Administered Medications  Medication Dose Route Frequency Provider Last Rate Last Dose  . 0.9 %  sodium chloride infusion  250 mL Intravenous PRN Jettie Booze, MD      . acetaminophen (TYLENOL) tablet 650 mg  650 mg Oral Q4H PRN Jettie Booze, MD      . albuterol (PROVENTIL) (2.5 MG/3ML) 0.083% nebulizer solution 2.5 mg  2.5 mg Nebulization Q4H PRN Ivor Costa, MD   2.5 mg at 09/17/15 1128  .  amLODipine (NORVASC) tablet 10 mg  10 mg Oral Daily Ivor Costa, MD   10 mg at 09/18/15 0959  . aspirin chewable tablet 81 mg  81 mg Oral Daily Jettie Booze, MD   81 mg at 09/18/15 [id number]  . atorvastatin (LIPITOR) tablet 80 mg  80 mg Oral q1800 Ivor Costa, MD   80 mg at 09/17/15 1801  . furosemide (LASIX) tablet 20 mg  20 mg Oral Daily Costin Karlyne Greenspan, MD   20 mg at 09/18/15 1005  . heparin injection 5,000 Units  5,000 Units Subcutaneous 3 times per day Jettie Booze, MD   5,000 Units at 09/18/15 0702  . hydrALAZINE (APRESOLINE) injection 5 mg  5 mg Intravenous Q2H PRN Ivor Costa, MD      . insulin aspart (novoLOG) injection 0-9 Units  0-9 Units Subcutaneous TID WC Ivor Costa, MD   1 Units at 09/16/15 1739  . insulin glargine (LANTUS) injection 15 Units  15 Units Subcutaneous BID Ivor Costa, MD   15 Units at 09/18/15 1002  . isosorbide mononitrate (IMDUR) 24 hr tablet 30 mg  30 mg Oral Daily Dorothy Spark, MD   30 mg at 09/18/15 0959  . meclizine (ANTIVERT) tablet 12.5 mg  12.5 mg Oral TID PRN Ivor Costa, MD      . metoprolol tartrate (LOPRESSOR) tablet 12.5 mg  12.5 mg Oral BID Jettie Booze, MD   12.5 mg at 09/18/15 0959  . morphine 2 MG/ML injection 2 mg  2 mg Intravenous Q4H PRN Ivor Costa, MD      . nitroGLYCERIN (NITROSTAT) SL tablet 0.4 mg  0.4 mg Sublingual Q5 min PRN Ivor Costa, MD      . ondansetron [facility]) injection 4 mg  4 mg Intravenous Q6H PRN Jettie Booze, MD      . pantoprazole (PROTONIX) EC tablet 40 mg  40 mg Oral Daily Ivor Costa, MD   40 mg at 09/18/15 0959  . sodium chloride flush (NS) 0.9 % injection 3 mL  3 mL Intravenous Q12H Jettie Booze, MD   3 mL at 09/18/15 1008  . sodium chloride flush (NS) 0.9 % injection 3 mL  3 mL Intravenous PRN Jettie Booze, MD   3 mL at 09/18/15 1006  . zolpidem (AMBIEN) tablet 5 mg  5 mg Oral QHS PRN Ivor Costa, MD        Prescriptions prior to admission  Medication Sig Dispense Refill Last Dose  . albuterol  (PROVENTIL HFA;VENTOLIN HFA) 108 (90  Base) MCG/ACT inhaler Inhale 2 puffs into the lungs every 6 (six) hours as needed for wheezing or shortness of breath.   09/16/2015 at Unknown time  . amLODipine (NORVASC) 10 MG tablet Take 10 mg by mouth daily.   09/14/2015 at Unknown time  . atorvastatin (LIPITOR) 80 MG tablet Take 80 mg by mouth daily.   09/14/2015 at Unknown time  . calcium carbonate (TUMS - DOSED IN MG ELEMENTAL CALCIUM) 500 MG chewable tablet Chew 1 tablet by mouth daily as needed for indigestion or heartburn.   Past Week at Unknown time  . Calcium Citrate-Vitamin D (CALCIUM + D PO) Take 1 tablet by mouth daily.   09/14/2015 at Unknown time  . Cholecalciferol (VITAMIN D-3 PO) Take 1 capsule by mouth daily.   09/14/2015 at Unknown time  . diphenhydrAMINE (BENADRYL) 25 MG tablet Take 25 mg by mouth as needed for itching or allergies.    09/14/2015 at Unknown time  . furosemide (LASIX) 20 MG tablet Take 20 mg by mouth every other day.   5 09/12/2015 at Unknown  . insulin glargine (LANTUS) 100 UNIT/ML injection Inject 20 Units into the skin 2 (two) times daily.    Past Week at Unknown time  . insulin lispro (HUMALOG) 100 UNIT/ML cartridge Inject 5-10 Units into the skin 3 (three) times daily between meals as needed (Per sliding scale).    Past Week at Unknown time  . IRON CR PO Take 65 mg by mouth daily.    Past Week at Unknown time  . metoprolol tartrate (LOPRESSOR) 25 MG tablet Take 12.5 mg by mouth 2 (two) times daily.   09/14/2015 at 7a  . meclizine (ANTIVERT) 12.5 MG tablet Take 12.5 mg by mouth 3 (three) times daily as needed for dizziness.   12-2014 at Unknown    Family History  Problem Relation Age of Onset  . Heart attack Father    Review of Systems:    Cardiac Review of Systems: Y or N  Chest Pain [ y   ]  Resting SOB [n   ] Exertional SOB  Blue.Reese  ]  [place].Latina [  ]   Pedal Edema [   ]    Palpitations [ N ] Syncope  Aqua.Slicker  ]   Presyncope [ N  ]  General Review of Systems: [Y] = yes [ N  ]=no Constitional: fatigue [  ];night sweats Aqua.Slicker  ]; fever [ N ]; or chills [ N ]                                                                Eye : blurred vision [ N ]; diplopia [ N  ]; Amaurosis fugax[ N ]; Resp: cough [ N ];  wheezing[ N ];  hemoptysis[  ]; shortness of breath[  ]; paroxysmal nocturnal dyspnea[  ]; dyspnea on exertion[  ]; or orthopnea[  ];  GI:  gallstones[  ], vomiting[  ];  dysphagia[  ]; melena[  ];  hematochezia [  ]; heartburn[  ];   Hx of  Colonoscopy[  ]; GU: kidney stones [  ]; hematuria[  ];   dysuria [  ];  nocturia[  ];  history of     obstruction [  ]; urinary frequency [  ]  Skin: rash, swelling[  ];, hair loss[  ];  peripheral edema[  ];  or itching[  ]; Musculosketetal: myalgias[  ];  joint swelling[  ];  joint erythema[  ];  joint pain[  ];  back pain[  ];  Heme/Lymph: bruising[  ];  bleeding[  ];  anemia[ Y ];  Neuro: Sharlene.Ates  ];    stroke[ N ];  vertigo[ N ];  seizures[  N];    difficulty walking[  ];  Psych:depression[  ]; anxiety[  ];  Endocrine: diabetes[ Y ];  thyroid dysfunction[ Y ];   Physical Exam: BP 143/45 mmHg  Pulse 72  Temp(Src) 98.2 F (36.8 C) (Tympanic)  Resp 18  Ht 5\' 2"  (1.575 m)  Wt 179 lb 12.8 oz (81.557 kg)  BMI 32.88 kg/m2  SpO2 99%   General appearance: alert Head: Normocephalic, without obvious abnormality, atraumatic Neck: no adenopathy, no carotid bruit, no JVD, supple, symmetrical, trachea midline and thyroid not enlarged, symmetric, no tenderness/mass/nodules Lymph nodes: Cervical, supraclavicular, and axillary nodes normal. Resp: diminished breath sounds bibasilar Back: symmetric, no curvature. ROM normal. No CVA tenderness. Cardio: regular rate and rhythm, S1, S2 normal, no murmur, click, rub or gallop GI: soft, non-tender; bowel sounds normal; no masses,  no organomegaly Extremities: extremities normal, atraumatic, no cyanosis or edema, edema mild both ankles  and Homans sign is negative, no sign of  DVT Neurologic: Grossly normal  Diagnostic Studies & Laboratory data:   LV EF: 65% - 70%  ------------------------------------------------------------------- Indications: Chest pain 786.51.  ------------------------------------------------------------------- History: PMH: Chest pain. Risk factors: Hypertension. Diabetes mellitus. Dyslipidemia.  ------------------------------------------------------------------- Study Conclusions  - Left ventricle: The cavity size was normal. There was mild focal  basal hypertrophy of the septum. Systolic function was vigorous.  The estimated ejection fraction was in the range of 65% to 70%.  Wall motion was normal; there were no regional wall motion  abnormalities. Doppler parameters are consistent with abnormal  left ventricular relaxation (grade 1 diastolic dysfunction).  Doppler parameters are consistent with high ventricular filling  pressure. - Mitral valve: Calcified annulus.  Transthoracic echocardiography. M-mode, complete 2D, spectral Doppler, and color Doppler. Birthdate: Patient birthdate: [birth year]. Age: Patient is 82 yr old. Sex: Gender: female. BMI: 32.9 kg/m^2. Blood pressure: 150/71 Patient status: Inpatient. Study date: Study date: 09/17/2015. Study time: 01:20 PM. Location: Bedside.  -------------------------------------------------------------------  ------------------------------------------------------------------- Left ventricle: The cavity size was normal. There was mild focal basal hypertrophy of the septum. Systolic function was vigorous. The estimated ejection fraction was in the range of 65% to 70%. Wall motion was normal; there were no regional wall motion abnormalities. Doppler parameters are consistent with abnormal left ventricular relaxation (grade 1 diastolic dysfunction). Doppler parameters are consistent with high ventricular filling  pressure.  ------------------------------------------------------------------- Aortic valve: Trileaflet; normal thickness leaflets. Mobility was not restricted. Doppler: Transvalvular velocity was within the normal range. There was no stenosis. There was no regurgitation.  ------------------------------------------------------------------- Aorta: Aortic root: The aortic root was normal in size.  ------------------------------------------------------------------- Mitral valve: Calcified annulus. Mobility was not restricted. Doppler: Transvalvular velocity was within the normal range. There was no evidence for stenosis. There was no regurgitation. Peak gradient (D): 3 mm Hg.  ------------------------------------------------------------------- Left atrium: The atrium was normal in size.  ------------------------------------------------------------------- Right ventricle: The cavity size was normal. Wall thickness was normal. Systolic function was normal.  ------------------------------------------------------------------- Pulmonic valve: Poorly visualized. Structurally normal valve. Cusp separation was normal. Doppler: Transvalvular velocity was within the normal range. There was no evidence for stenosis. There  was no regurgitation.  ------------------------------------------------------------------- Tricuspid valve: Structurally normal valve. Doppler: Transvalvular velocity was within the normal range. There was no regurgitation.  ------------------------------------------------------------------- Pulmonary artery: The main pulmonary artery was normal-sized. Systolic pressure was within the normal range.  ------------------------------------------------------------------- Right atrium: The atrium was normal in size.  ------------------------------------------------------------------- Pericardium: There was no pericardial  effusion.  ------------------------------------------------------------------- Systemic veins: Inferior vena cava: The vessel was normal in size.   Left Heart Cath and Coronary Angiography BY Dr. Irish Lack on 09/16/2012:  Conclusion     Prox RCA to Mid RCA lesion, 60% stenosed.  Dist RCA lesion, 75% stenosed.  Ost Cx lesion, 99% stenosed.  Prox LAD to Mid LAD lesion, 90% stenosed.  Ost 2nd Diag lesion, 80% stenosed.  Severe three vessel disease. Plan for CVTS consult for CABG. She needs BP control, beta blocker and statin.      Recent Radiology Findings:  EXAM: CHEST 2 VIEW  COMPARISON: 11/22/2012  FINDINGS: Stable mediastinal contours with borderline cardiomegaly. No pulmonary edema, confluent airspace disease, pleural effusion or pneumothorax. Exaggerated thoracic kyphosis and degenerative change throughout the thoracic spine, stable.  IMPRESSION: Unchanged appearance of the chest. No acute process.   Electronically Signed  By: Jeb Levering M.D.  On: 09/14/2015 23:04   I have independently reviewed the above radiologic studies.  Recent Lab Findings: Lab Results  Component Value Date   WBC 7.3 09/18/2015   HGB 9.6* 09/18/2015   HCT 30.9* 09/18/2015   PLT 212 09/18/2015   GLUCOSE 196* 09/18/2015   CHOL 139 09/15/2015   TRIG 119 09/15/2015   HDL 35* 09/15/2015   LDLCALC 80 09/15/2015   ALT 20 11/22/2012   AST 22 11/22/2012   NA 139 09/18/2015   K 4.1 09/18/2015   CL 106 09/18/2015   CREATININE 1.48* 09/18/2015   BUN 20 09/18/2015   CO2 24 09/18/2015   INR 1.01 09/17/2015   HGBA1C 8.2* 09/15/2015   Assessment / Plan:      1. Coronary artery disease-severe 3 vessel CAD with LVEF 65-70%. Consideration for CABG 2. DM-on Insulin. Pre op HGA1C 8.2 3. Hyperlipidemia-on Lipitor 80 mg at hs 4. Essential hypertension-on Norvasc 10 mg daily, Lopressor 12.5 mg bid, and Imdur 60 mg daily 5. CKD (stage IV)-creatinine remains 1.48. Will  ask nephrology to evaluate for pre op clearance Chronic Kidney Disease   Stage I     GFR >90  Stage II    GFR 60-89  Stage IIIA GFR 45-59  Stage IIIB GFR 30-44  Stage IV   GFR 15-29  Stage V    GFR  <15  Lab Results  Component Value Date   CREATININE 1.48* 09/18/2015   Estimated Creatinine Clearance: 28.5 mL/min (by C-G formula based on Cr of 1.48).  6. Carotid bruit-await carotid duplex US   I have recommended to patient proceeding with CABG because of high risk anatomy and symptoms .She and family are are aware of increased risk because of patient cerebral vascular disease, stage IV ckd and age , The goals risks and alternatives of the planned surgical procedure CABG  have been discussed with the patient in detail. The risks of the procedure including death, infection, stroke, myocardial infarction, bleeding, blood transfusion have all been discussed specifically.  I have quoted Peytan Ryland a 8 % of perioperative mortality and a complication rate as high as 40 %. The patient's questions have been answered.Jullie Franklyn is willing  to proceed with the planned procedure.  In addition to other potential risks and  complications from the surgery, I have made the patient aware of the recent [place] concerning the risk of infection by Myocobacterium chimaera related to the use of Stockert 3T heater-cooler equipment during cardiac surgery. I discussed with the patient the low risk of infection, as well as our compliance with the most current FDA recommendations to minimize infection and testing of all devices for contamination. The patient has been made aware of the limited alternatives to immediately replacing the current equipment. The patient has been informed regarding the risks associated with waiting to proceed with needed surgery and that such risks are greater than the risk of infection related to the use of the heater-cooler device. I did make the patient aware that after careful  review of the patients having cardiac surgery at [facility] we have no evidence that heater/cooler related infections have occurred at [facility]. We discussed that this is a slow-growing bacterium, such that it can take some period of time for symptoms to develop.  I  spent 40 minutes counseling the patient face to face and 50% or more the  time was spent in counseling and coordination of care. The total time spent in the appointment was 60 minutes.

## 2015-09-18 NOTE — Progress Notes (Addendum)
VASCULAR LAB PRELIMINARY  PRELIMINARY  PRELIMINARY  PRELIMINARY  Bilateral lower extremity venous duplex completed.    Preliminary report:  Bilateral:  No evidence of DVT or superficial thrombosis.   Right:  Baker's cyst in popliteal fossa.    Navin Dogan, RVT 09/18/2015, 6:08 PM

## 2015-09-18 NOTE — Anesthesia Preprocedure Evaluation (Addendum)
Anesthesia Evaluation  Patient identified by MRN, date of birth, ID band Patient awake    Reviewed: Allergy & Precautions, NPO status , Patient's Chart, lab work & pertinent test results, reviewed documented beta blocker date and time   Airway Mallampati: III  TM Distance: >3 FB Neck ROM: Full    Dental  (+) Edentulous Upper, Edentulous Lower   Pulmonary asthma ,    breath sounds clear to auscultation       Cardiovascular hypertension, Pt. on medications and Pt. on home beta blockers + angina at rest + CAD   Rhythm:Regular Rate:Normal     Neuro/Psych negative neurological ROS  negative psych ROS   GI/Hepatic PUD, GERD  ,  Endo/Other  diabetes, Type 2, Insulin Dependent  Renal/GU CRFRenal disease  negative genitourinary   Musculoskeletal negative musculoskeletal ROS (+)   Abdominal   Peds negative pediatric ROS (+)  Hematology negative hematology ROS (+)   Anesthesia Other Findings - HLD  Reproductive/Obstetrics negative OB ROS                            Lab Results  Component Value Date   WBC 7.3 09/18/2015   HGB 9.6* 09/18/2015   HCT 30.9* 09/18/2015   MCV 88.0 09/18/2015   PLT 212 09/18/2015   Lab Results  Component Value Date   CREATININE 1.48* 09/18/2015   BUN 20 09/18/2015   NA 139 09/18/2015   K 4.1 09/18/2015   CL 106 09/18/2015   CO2 24 09/18/2015   Lab Results  Component Value Date   INR 1.01 09/17/2015   INR 1.07 09/15/2015   INR 0.92 11/22/2012   09/2015 EKG: normal sinus rhythm.  09/2015 Heart Cath Prox RCA to Mid RCA lesion, 60% stenosed.  Dist RCA lesion, 75% stenosed.  Ost Cx lesion, 99% stenosed.  Prox LAD to Mid LAD lesion, 90% stenosed.  Ost 2nd Diag lesion, 80% stenosed.  09/2015 Echo - Left ventricle: The cavity size was normal. There was mild focal basal hypertrophy of the septum. Systolic function was vigorous. The estimated ejection  fraction was in the range of 65% to 70%. Wall motion was normal; there were no regional wall motion abnormalities. Doppler parameters are consistent with abnormal left ventricular relaxation (grade 1 diastolic dysfunction). Doppler parameters are consistent with high ventricular filling pressure. - Mitral valve: Calcified annulus. Peak gradient 28mmhg   Anesthesia Physical Anesthesia Plan  ASA: IV  Anesthesia Plan: General   Post-op Pain Management:    Induction: Intravenous  Airway Management Planned: Oral ETT  Additional Equipment: Arterial line, TEE, CVP, PA Cath and Ultrasound Guidance Line Placement  Intra-op Plan:   Post-operative Plan: Post-operative intubation/ventilation  Informed Consent: I have reviewed the patients History and Physical, chart, labs and discussed the procedure including the risks, benefits and alternatives for the proposed anesthesia with the patient or authorized representative who has indicated his/her understanding and acceptance.   Dental advisory given  Plan Discussed with: CRNA  Anesthesia Plan Comments:         Anesthesia Quick Evaluation

## 2015-09-18 NOTE — Progress Notes (Addendum)
PROGRESS NOTE  Nandana Birch [id number] DOB: 1931-01-07 DOA: 09/14/2015 PCP: Benito Mccreedy, MD Outpatient Specialists:      Brief Narrative: 82 y.o. female with PMH of hypertension, hyperlipidemia, diabetes mellitus, asthma, GERD, gout, chronic kidney disease-stage III, peptic ulcer disease, who presented with chest pain  Interim summary Cardiology was consulted on patient's admission, and given high risk for coronary disease she underwent cardiac catheterization on 3/13. This revealed multivessel disease, cardiothoracic surgery was consulted.  Assessment & Plan: Principal Problem:   Unstable angina (HCC) Active Problems:   Accelerated hypertension   Hyperlipidemia   Diabetes mellitus with renal complications (HCC)   Asthma   CKD (chronic kidney disease), stage III   GERD (gastroesophageal reflux disease)   Gout   Chest pain   Abnormal finding on EKG - dynamic changes   Pain in the chest   Essential hypertension   Chest pain  - cardiology consulted, appreciate input.  - concerning for cardiac etiology given relationship with exertion and resolution with rest.  - cardiac enzymes negative 3 - cardiac catheterization 3/13 as below, severe disease, thoracic surgery to see today   Conclusion     Prox RCA to Mid RCA lesion, 60% stenosed.  Dist RCA lesion, 75% stenosed.  Ost Cx lesion, 99% stenosed.  Prox LAD to Mid LAD lesion, 90% stenosed.  Ost 2nd Diag lesion, 80% stenosed.  Severe three vessel disease. Plan for CVTS consult for CABG. She needs BP control, beta blocker and statin.   Right calf / thigh pain  - Patient with new onset pain behind her right knee, slightly more swollen on exam  - will obtain a DVT ultrasound  Accelerated hypertension / Hypertensive urgency - Initial blood pressure 190/77.Blood pressure improved after being treated with nitroglycerin patch in the emergency room. - improved, continue current regimen  HLD - Continue home  medications: Lipitor - LDL 80, HDL 35.  DM-II  - Last A1c not on record. Patient is taking that has, sliding-scale insulin, Amaryl, rosiglitazone at home. Per Epic records, patient supposed to take 100 units of Lantus twice a day, but patient strongly insists that she is taking 20 units Lantus twice a day at home. - will decrease Lantus dose from 20 units to 15 units twice a day  - SSI - A1c 8.2 - CBG is currently controlled, fasting this morning is 102, continue current regimen  Asthma  - Stable, no wheezing on exam - When necessary albuterol nebulizer  CKD (chronic kidney disease), stage III  - Stable. Previous creatinine was 1.70 on 11/22/12.  - Renal function stable after cardiac catheterization, she has mild increase in lower extremity swelling, we'll resume her home Lasix today. Stop fluids.  GERD: - Protonix  Gout - continue home allopurinol  Chronic diastolic CHF - euvolemic   DVT prophylaxis: heparin Code Status: Full Family Communication: d/w daughter bedside Disposition Plan: TBD Barriers for discharge: cardiothoracic evaluation  Consultants:   Cardiology   Cardiothoracic surgery   Procedures:   2D echo  Cath   Antimicrobials:  None    Subjective: - no chest pain, no abdominal pain, no nausea/vomiting. She feels overall well - complains of right leg pain  Objective: Filed Vitals:   09/17/15 2015 09/17/15 2050 09/18/15 0509 09/18/15 0958  BP: 140/53 126/45 162/63 143/45  Pulse: 68 67 69 72  Temp:   98.2 F (36.8 C)   TempSrc:   Tympanic   Resp:  18 18   Height:      Weight:  SpO2:  98% 99%     Intake/Output Summary (Last 24 hours) at 09/18/15 1029 Last data filed at 09/18/15 1005  Gross per 24 hour  Intake    243 ml  Output      0 ml  Net    243 ml   Filed Weights   09/14/15 2139 09/15/15 0227 09/17/15 0449  Weight: 83.915 kg (185 lb) 84.687 kg (186 lb 11.2 oz) 81.557 kg (179 lb 12.8 oz)    Examination: BP 143/45 mmHg   Pulse 72  Temp(Src) 98.2 F (36.8 C) (Tympanic)  Resp 18  Ht 5\' 2"  (1.575 m)  Wt 81.557 kg (179 lb 12.8 oz)  BMI 32.88 kg/m2  SpO2 99%  GENERAL: NAD  HEENT: head NCAT, no scleral icterus.   NECK: Supple. No carotid bruits. No lymphadenopathy or thyromegaly.  LUNGS: Clear to auscultation. No wheezing or crackles  HEART: Regular rate and rhythm without murmur. 2+ pulses, no JVD, 1 + peripheral edema  ABDOMEN: Soft, nontender, and nondistended. Positive bowel sounds.   EXT: RLE more swollen than LLE   Data Reviewed: I have personally reviewed following labs and imaging studies  CBC:  Recent Labs Lab 09/14/15 2230 09/17/15 0001 09/18/15 0232  WBC 8.4 6.2 7.3  HGB 11.1* 10.7* 9.6*  HCT 34.0* 33.7* 30.9*  MCV 88.1 88.5 88.0  PLT 239 221 [id number]   Basic Metabolic Panel:  Recent Labs Lab 09/14/15 2230 09/17/15 0001 09/18/15 0232  NA 138 141 139  K 3.8 4.1 4.1  CL 101 106 106  CO2 25 24 24   GLUCOSE 180* 156* 196*  BUN 33* 21* 20  CREATININE 1.55* 1.49* 1.48*  CALCIUM 8.9 8.5* 8.1*   GFR: Estimated Creatinine Clearance: 28.5 mL/min (by C-G formula based on Cr of 1.48).  Coagulation Profile:  Recent Labs Lab 09/15/15 0343 09/17/15 0001  INR 1.07 1.01   Cardiac Enzymes:  Recent Labs Lab 09/14/15 2230 09/15/15 0343 09/15/15 0818 09/15/15 1454  TROPONINI <0.03 0.03 <0.03 <0.03   CBG:  Recent Labs Lab 09/17/15 0623 09/17/15 1128 09/17/15 1800 09/17/15 2115 09/18/15 0701  GLUCAP 77 64* 132* 241* 102*   Urine analysis:    Component Value Date/Time   COLORURINE YELLOW 09/14/2015 2317   COLORURINE Straw 07/09/2012 0108   APPEARANCEUR CLEAR 09/14/2015 2317   APPEARANCEUR Hazy 07/09/2012 0108   LABSPEC 1.007 09/14/2015 2317   LABSPEC 1.010 07/09/2012 0108   PHURINE 6.5 09/14/2015 2317   PHURINE 5.0 07/09/2012 0108   GLUCOSEU NEGATIVE 09/14/2015 2317   GLUCOSEU >=500 07/09/2012 0108   HGBUR SMALL* 09/14/2015 2317   HGBUR 1+ 07/09/2012 0108    BILIRUBINUR NEGATIVE 09/14/2015 2317   BILIRUBINUR Negative 07/09/2012 0108   KETONESUR NEGATIVE 09/14/2015 2317   KETONESUR Negative 07/09/2012 0108   PROTEINUR 100* 09/14/2015 2317   PROTEINUR 30 mg/dL 07/09/2012 0108   UROBILINOGEN 0.2 11/22/2012 1809   NITRITE NEGATIVE 09/14/2015 2317   NITRITE Negative 07/09/2012 0108   LEUKOCYTESUR NEGATIVE 09/14/2015 2317   LEUKOCYTESUR 1+ 07/09/2012 0108   Sepsis Labs: Invalid input(s): PROCALCITONIN, LACTICIDVEN  No results found for this or any previous visit (from the past 240 hour(s)).    Radiology Studies: No results found.   Scheduled Meds: . amLODipine  10 mg Oral Daily  . aspirin  81 mg Oral Daily  . atorvastatin  80 mg Oral q1800  . furosemide  20 mg Oral Daily  . heparin  5,000 Units Subcutaneous 3 times per day  . insulin aspart  0-9  Units Subcutaneous TID WC  . insulin glargine  15 Units Subcutaneous BID  . isosorbide mononitrate  30 mg Oral Daily  . metoprolol tartrate  12.5 mg Oral BID  . pantoprazole  40 mg Oral Daily  . sodium chloride flush  3 mL Intravenous Q12H   Continuous Infusions: . sodium chloride Stopped (09/17/15 1200)  . dextrose 5 % and 0.45% NaCl 84.7 mL/hr at 09/17/15 2159     Marzetta Board, MD, PhD [facility] Pager [phone]  If 7PM-7AM, please contact night-coverage [email or web address] Password [facility] 09/18/2015, 10:29 AM

## 2015-09-18 NOTE — Progress Notes (Addendum)
VASCULAR LAB PRELIMINARY  PRELIMINARY  PRELIMINARY  PRELIMINARY  Pre-op Cardiac Surgery  Carotid Findings:  Bilateral:  1-39% ICA stenosis.  Vertebral artery flow is antegrade.      Upper Extremity Right Left  Brachial Pressures 178  Triphasic  172  Triphasic   Radial Waveforms Triphasic  Triphasic   Ulnar Waveforms Triphasic  Triphasic   Palmar Arch (Allen's Test) Within normal limits  Within normal limits     Lower  Extremity Right Left  Dorsalis Pedis 170  Monophasic  231 Monophasic   Anterior Tibial    Posterior Tibial 144  Monophasic  244  Monophasic   Ankle/Brachial Indices 0.95   calcified    Bilateral Lower Extremity Vein Map    Right Great Saphenous Vein   Segment Diameter Comment  1. Origin 7.21 mm   2. High Thigh 3.34 mm   3. Mid Thigh 2.98 mm   4. Low Thigh 3.10 mm   5. At Knee 2.43 mm Baker's cyst in Pop fossa  6. High Calf mm   7. Low Calf mm   8. Ankle mm    mm    mm    mm     Left Great Saphenous Vein   Segment Diameter Comment  1. Origin 4.57 mm   2. High Thigh 3.14 mm   3. Mid Thigh 3.14 mm   4. Low Thigh 2.6 mm   5. At Knee 2.28 mm   6. High Calf 1.87 mm   7. Low Calf 1.41 mm   8. Ankle 1.87 mm    mm    mm    mm

## 2015-09-18 NOTE — Progress Notes (Signed)
PT Cancellation Note  Patient Details Name: Monica [name] MRN: [id number] DOB: 1930-07-18   Cancelled Treatment:    Reason Eval/Treat Not Completed: Patient not medically ready.. Awaiting an [place] for clearance of LE DVT, will see pt after the MD has cleared her for activity.   Ramond Dial 09/18/2015, 10:53 AM   Mee Hives, PT MS Acute Rehab Dept. Number: [phone] [id number] and [place] [phone]

## 2015-09-18 NOTE — Consult Note (Signed)
Monica [name] Admit Date: 09/14/2015 09/18/2015 Rexene Agent Requesting Physician:  Servando Snare  Reason for Consult:  CKD3, need CABG HPI:  74F with hx/o CKD3, BL SCr 1.5 seen at the request of Dr. Servando Snare for preoperative renal evaluation. Patient presented with unstable angina and on left heart catheterization 3/13 was diagnosed with severe triple-vessel disease with plan for CABG on 09/19/15. Patient states that she sees a nephrologist in [facility] and her kidney disease is connected to her diabetes. She currently is free of any chest pain or dyspnea. Blood pressure has been well-controlled since presentation. She has minimal edema. Electrolytes are acceptable. Home and current medication list has been reviewed.  CREATININE (mg/dL)  Date Value  07/09/2012 1.99*   CREATININE, SER (mg/dL)  Date Value  09/18/2015 1.48*  09/17/2015 1.49*  09/14/2015 1.55*  11/22/2012 1.70*  11/22/2012 1.70*  06/29/2009 1.0  09/19/2008 .9  ]  ROS Balance of 12 systems is negative w/ exceptions as above  PMH  Past Medical History  Diagnosis Date  . Peptic ulcer   . Mass   . Essential hypertension   . Hyperlipidemia   . Type 2 diabetes mellitus ([place])   . Asthma   . History of blood transfusion   . CKD (chronic kidney disease), stage III    PSH  Past Surgical History  Procedure Laterality Date  . Abdominal hysterectomy    . Colon surgery    . Cardiac catheterization N/A 09/17/2015    Procedure: Left Heart Cath and Coronary Angiography;  Surgeon: Jettie Booze, MD;  Location: [facility];  Service: Cardiovascular;  Laterality: N/A;   FH  Family History  Problem Relation Age of Onset  . Heart attack Father    [place]  reports that she has never smoked. She has quit using smokeless tobacco. Her smokeless tobacco use included Snuff. She reports that she does not drink alcohol or use illicit drugs. Allergies No Known Allergies Home medications Prior to Admission medications    Medication Sig Start Date End Date Taking? Authorizing Provider  albuterol (PROVENTIL HFA;VENTOLIN HFA) 108 (90 Base) MCG/ACT inhaler Inhale 2 puffs into the lungs every 6 (six) hours as needed for wheezing or shortness of breath.   Yes Historical Provider, MD  amLODipine (NORVASC) 10 MG tablet Take 10 mg by mouth daily.   Yes Historical Provider, MD  atorvastatin (LIPITOR) 80 MG tablet Take 80 mg by mouth daily.   Yes Historical Provider, MD  calcium carbonate (TUMS - DOSED IN MG ELEMENTAL CALCIUM) 500 MG chewable tablet Chew 1 tablet by mouth daily as needed for indigestion or heartburn.   Yes Historical Provider, MD  Calcium Citrate-Vitamin D (CALCIUM + D PO) Take 1 tablet by mouth daily.   Yes Historical Provider, MD  Cholecalciferol (VITAMIN D-3 PO) Take 1 capsule by mouth daily.   Yes Historical Provider, MD  diphenhydrAMINE (BENADRYL) 25 MG tablet Take 25 mg by mouth as needed for itching or allergies.    Yes Historical Provider, MD  furosemide (LASIX) 20 MG tablet Take 20 mg by mouth every other day.  05/17/14  Yes Historical Provider, MD  insulin glargine (LANTUS) 100 UNIT/ML injection Inject 20 Units into the skin 2 (two) times daily.    Yes Historical Provider, MD  insulin lispro (HUMALOG) 100 UNIT/ML cartridge Inject 5-10 Units into the skin 3 (three) times daily between meals as needed (Per sliding scale).    Yes Historical Provider, MD  IRON CR PO Take 65 mg by  mouth daily.    Yes Historical Provider, MD  metoprolol tartrate (LOPRESSOR) 25 MG tablet Take 12.5 mg by mouth 2 (two) times daily.   Yes Historical Provider, MD  meclizine (ANTIVERT) 12.5 MG tablet Take 12.5 mg by mouth 3 (three) times daily as needed for dizziness.    Historical Provider, MD    Current Medications Scheduled Meds: . amLODipine  10 mg Oral Daily  . aspirin  81 mg Oral Daily  . atorvastatin  80 mg Oral q1800  . furosemide  20 mg Oral Daily  . heparin  5,000 Units Subcutaneous 3 times per day  . insulin  aspart  0-9 Units Subcutaneous TID WC  . insulin glargine  15 Units Subcutaneous BID  . [START ON 09/19/2015] isosorbide mononitrate  60 mg Oral Daily  . metoprolol tartrate  12.5 mg Oral BID  . pantoprazole  40 mg Oral Daily  . sodium chloride flush  3 mL Intravenous Q12H   Continuous Infusions:  PRN Meds:.sodium chloride, acetaminophen, albuterol, hydrALAZINE, meclizine, morphine injection, nitroGLYCERIN, ondansetron (ZOFRAN) IV, sodium chloride flush, zolpidem  CBC  Recent Labs Lab 09/14/15 2230 09/17/15 0001 09/18/15 0232  WBC 8.4 6.2 7.3  HGB 11.1* 10.7* 9.6*  HCT 34.0* 33.7* 30.9*  MCV 88.1 88.5 88.0  PLT 239 221 [id number]   Basic Metabolic Panel  Recent Labs Lab 09/14/15 2230 09/17/15 0001 09/18/15 0232  NA 138 141 139  K 3.8 4.1 4.1  CL 101 106 106  CO2 25 24 24   GLUCOSE 180* 156* 196*  BUN 33* 21* 20  CREATININE 1.55* 1.49* 1.48*  CALCIUM 8.9 8.5* 8.1*    Physical Exam  Blood pressure 142/51, pulse 72, temperature 98.5 F (36.9 C), temperature source Oral, resp. rate 19, height 5\' 2"  (1.575 m), weight 81.557 kg (179 lb 12.8 oz), SpO2 98 %. GEN: NAD, obese ENT: NCAT EYES: EOMI CV: RRR PULM: CTAB, nl wob ABD: s/nt/nd SKIN: no rashes/lesions EXT:No LEE  Assessment 31F with CKD3, stable, in need of CABG for 3V disease  1. CKD3 presumed 2/2 DM and HTN: discussed risks of worsened renal function and possible need for RRT in perioperative setting; pt aware and accepts those risks 2. CAD, 3V disease, for CABG 3/15 3. DM2 4. HTN 5. HLD  Plan 1. Proceed with CABG Daily weights, Daily Renal Panel, Strict I/Os, Avoid nephrotoxins (NSAIDs, judicious IV Contrast)  Will follow along   Pearson Grippe MD [phone] pgr 09/18/2015, 2:36 PM

## 2015-09-18 NOTE — Progress Notes (Signed)
Inpatient Diabetes Program Recommendations  AACE/ADA: New Consensus Statement on Inpatient Glycemic Control (2015)  Target Ranges:  Prepandial:   less than 140 mg/dL      Peak postprandial:   less than 180 mg/dL (1-2 hours)      Critically ill patients:  140 - 180 mg/dL   Review of Glycemic Control Results for KADIAN, MURREY (MRN [id number]) as of 09/18/2015 10:17  Ref. Range 09/17/2015 06:23 09/17/2015 11:28 09/17/2015 18:00 09/17/2015 21:15 09/18/2015 07:01  Glucose-Capillary Latest Ref Range: 65-99 mg/dL 77 64 (L) 132 (H) 241 (H) 102 (H)   Inpatient Diabetes Program Recommendations:  Insulin - Basal: reduce Lantus to 10 BID.  Thank you  Raoul Pitch BSN, RN,CDE Inpatient Diabetes Coordinator [phone] (team pager)

## 2015-09-19 ENCOUNTER — Encounter (HOSPITAL_COMMUNITY): Payer: Self-pay | Admitting: Certified Registered Nurse Anesthetist

## 2015-09-19 ENCOUNTER — Observation Stay (HOSPITAL_COMMUNITY): Payer: Medicare HMO

## 2015-09-19 ENCOUNTER — Inpatient Hospital Stay (HOSPITAL_COMMUNITY): Payer: Medicare HMO

## 2015-09-19 ENCOUNTER — Encounter (HOSPITAL_COMMUNITY)
Admission: EM | Disposition: A | Discharge status: Skilled Nursing Facility | Payer: Self-pay | Source: Home / Self Care | Attending: Cardiothoracic Surgery

## 2015-09-19 ENCOUNTER — Observation Stay (HOSPITAL_COMMUNITY): Payer: Medicare HMO | Admitting: Certified Registered"

## 2015-09-19 DIAGNOSIS — I10 Essential (primary) hypertension: Secondary | ICD-10-CM | POA: Diagnosis not present

## 2015-09-19 DIAGNOSIS — I25110 Atherosclerotic heart disease of native coronary artery with unstable angina pectoris: Secondary | ICD-10-CM | POA: Diagnosis present

## 2015-09-19 DIAGNOSIS — R339 Retention of urine, unspecified: Secondary | ICD-10-CM | POA: Diagnosis not present

## 2015-09-19 DIAGNOSIS — Z794 Long term (current) use of insulin: Secondary | ICD-10-CM | POA: Diagnosis not present

## 2015-09-19 DIAGNOSIS — I200 Unstable angina: Secondary | ICD-10-CM | POA: Diagnosis not present

## 2015-09-19 DIAGNOSIS — R079 Chest pain, unspecified: Secondary | ICD-10-CM | POA: Diagnosis present

## 2015-09-19 DIAGNOSIS — I130 Hypertensive heart and chronic kidney disease with heart failure and stage 1 through stage 4 chronic kidney disease, or unspecified chronic kidney disease: Secondary | ICD-10-CM | POA: Diagnosis present

## 2015-09-19 DIAGNOSIS — E669 Obesity, unspecified: Secondary | ICD-10-CM | POA: Diagnosis present

## 2015-09-19 DIAGNOSIS — Z8711 Personal history of peptic ulcer disease: Secondary | ICD-10-CM | POA: Diagnosis not present

## 2015-09-19 DIAGNOSIS — E870 Hyperosmolality and hypernatremia: Secondary | ICD-10-CM | POA: Diagnosis not present

## 2015-09-19 DIAGNOSIS — N184 Chronic kidney disease, stage 4 (severe): Secondary | ICD-10-CM | POA: Diagnosis present

## 2015-09-19 DIAGNOSIS — M40204 Unspecified kyphosis, thoracic region: Secondary | ICD-10-CM | POA: Diagnosis present

## 2015-09-19 DIAGNOSIS — I160 Hypertensive urgency: Secondary | ICD-10-CM | POA: Diagnosis present

## 2015-09-19 DIAGNOSIS — N170 Acute kidney failure with tubular necrosis: Secondary | ICD-10-CM | POA: Diagnosis not present

## 2015-09-19 DIAGNOSIS — Z951 Presence of aortocoronary bypass graft: Secondary | ICD-10-CM

## 2015-09-19 DIAGNOSIS — M109 Gout, unspecified: Secondary | ICD-10-CM | POA: Diagnosis present

## 2015-09-19 DIAGNOSIS — D62 Acute posthemorrhagic anemia: Secondary | ICD-10-CM | POA: Diagnosis not present

## 2015-09-19 DIAGNOSIS — E1122 Type 2 diabetes mellitus with diabetic chronic kidney disease: Secondary | ICD-10-CM | POA: Diagnosis present

## 2015-09-19 DIAGNOSIS — K219 Gastro-esophageal reflux disease without esophagitis: Secondary | ICD-10-CM | POA: Diagnosis present

## 2015-09-19 DIAGNOSIS — I5032 Chronic diastolic (congestive) heart failure: Secondary | ICD-10-CM | POA: Diagnosis present

## 2015-09-19 DIAGNOSIS — G9341 Metabolic encephalopathy: Secondary | ICD-10-CM | POA: Diagnosis not present

## 2015-09-19 DIAGNOSIS — N179 Acute kidney failure, unspecified: Secondary | ICD-10-CM | POA: Diagnosis not present

## 2015-09-19 DIAGNOSIS — I2510 Atherosclerotic heart disease of native coronary artery without angina pectoris: Secondary | ICD-10-CM | POA: Diagnosis not present

## 2015-09-19 DIAGNOSIS — Z8673 Personal history of transient ischemic attack (TIA), and cerebral infarction without residual deficits: Secondary | ICD-10-CM | POA: Diagnosis not present

## 2015-09-19 DIAGNOSIS — Z6834 Body mass index (BMI) 34.0-34.9, adult: Secondary | ICD-10-CM | POA: Diagnosis not present

## 2015-09-19 DIAGNOSIS — Z87891 Personal history of nicotine dependence: Secondary | ICD-10-CM | POA: Diagnosis not present

## 2015-09-19 DIAGNOSIS — R1312 Dysphagia, oropharyngeal phase: Secondary | ICD-10-CM | POA: Diagnosis not present

## 2015-09-19 DIAGNOSIS — E43 Unspecified severe protein-calorie malnutrition: Secondary | ICD-10-CM | POA: Diagnosis present

## 2015-09-19 DIAGNOSIS — I472 Ventricular tachycardia: Secondary | ICD-10-CM | POA: Diagnosis present

## 2015-09-19 DIAGNOSIS — E785 Hyperlipidemia, unspecified: Secondary | ICD-10-CM | POA: Diagnosis present

## 2015-09-19 DIAGNOSIS — J45909 Unspecified asthma, uncomplicated: Secondary | ICD-10-CM | POA: Diagnosis present

## 2015-09-19 DIAGNOSIS — R4182 Altered mental status, unspecified: Secondary | ICD-10-CM | POA: Diagnosis not present

## 2015-09-19 HISTORY — PX: TEE WITHOUT CARDIOVERSION: SHX5443

## 2015-09-19 HISTORY — PX: CORONARY ARTERY BYPASS GRAFT: SHX141

## 2015-09-19 LAB — POCT I-STAT 3, ART BLOOD GAS (G3+)
ACID-BASE DEFICIT: 4 mmol/L — AB (ref 0.0–2.0)
Acid-Base Excess: 3 mmol/L — ABNORMAL HIGH (ref 0.0–2.0)
BICARBONATE: 25.3 meq/L — AB (ref 20.0–24.0)
Bicarbonate: 22.4 mEq/L (ref 20.0–24.0)
Bicarbonate: 26.9 mEq/L — ABNORMAL HIGH (ref 20.0–24.0)
O2 SAT: 100 %
O2 Saturation: 100 %
O2 Saturation: 99 %
PCO2 ART: 37.2 mmHg (ref 35.0–45.0)
PH ART: 7.264 — AB (ref 7.350–7.450)
PH ART: 7.401 (ref 7.350–7.450)
PH ART: 7.468 — AB (ref 7.350–7.450)
PO2 ART: 387 mmHg — AB (ref 80.0–100.0)
Patient temperature: 36.8
TCO2: 24 mmol/L (ref 0–100)
TCO2: 26 mmol/L (ref 0–100)
TCO2: 28 mmol/L (ref 0–100)
pCO2 arterial: 40.6 mmHg (ref 35.0–45.0)
pCO2 arterial: 49.8 mmHg — ABNORMAL HIGH (ref 35.0–45.0)
pO2, Arterial: 141 mmHg — ABNORMAL HIGH (ref 80.0–100.0)
pO2, Arterial: 205 mmHg — ABNORMAL HIGH (ref 80.0–100.0)

## 2015-09-19 LAB — POCT I-STAT, CHEM 8
BUN: 18 mg/dL (ref 6–20)
BUN: 18 mg/dL (ref 6–20)
BUN: 16 mg/dL (ref 6–20)
BUN: 18 mg/dL (ref 6–20)
BUN: 17 mg/dL (ref 6–20)
BUN: 17 mg/dL (ref 6–20)
BUN: 18 mg/dL (ref 6–20)
CALCIUM ION: 1.11 mmol/L — AB (ref 1.13–1.30)
CALCIUM ION: 1.07 mmol/L — AB (ref 1.13–1.30)
CALCIUM ION: 0.99 mmol/L — AB (ref 1.13–1.30)
CALCIUM ION: 1 mmol/L — AB (ref 1.13–1.30)
CHLORIDE: 102 mmol/L (ref 101–111)
CHLORIDE: 100 mmol/L — AB (ref 101–111)
CHLORIDE: 95 mmol/L — AB (ref 101–111)
CHLORIDE: 99 mmol/L — AB (ref 101–111)
CHLORIDE: 100 mmol/L — AB (ref 101–111)
CREATININE: 1.4 mg/dL — AB (ref 0.44–1.00)
CREATININE: 1.2 mg/dL — AB (ref 0.44–1.00)
Calcium, Ion: 0.92 mmol/L — ABNORMAL LOW (ref 1.13–1.30)
Calcium, Ion: 0.95 mmol/L — ABNORMAL LOW (ref 1.13–1.30)
Calcium, Ion: 0.94 mmol/L — ABNORMAL LOW (ref 1.13–1.30)
Chloride: 103 mmol/L (ref 101–111)
Chloride: 104 mmol/L (ref 101–111)
Creatinine, Ser: 1.2 mg/dL — ABNORMAL HIGH (ref 0.44–1.00)
Creatinine, Ser: 1.2 mg/dL — ABNORMAL HIGH (ref 0.44–1.00)
Creatinine, Ser: 1.2 mg/dL — ABNORMAL HIGH (ref 0.44–1.00)
Creatinine, Ser: 1.2 mg/dL — ABNORMAL HIGH (ref 0.44–1.00)
Creatinine, Ser: 1.5 mg/dL — ABNORMAL HIGH (ref 0.44–1.00)
GLUCOSE: 118 mg/dL — AB (ref 65–99)
GLUCOSE: 138 mg/dL — AB (ref 65–99)
GLUCOSE: 154 mg/dL — AB (ref 65–99)
Glucose, Bld: 110 mg/dL — ABNORMAL HIGH (ref 65–99)
Glucose, Bld: 93 mg/dL (ref 65–99)
Glucose, Bld: 154 mg/dL — ABNORMAL HIGH (ref 65–99)
Glucose, Bld: 137 mg/dL — ABNORMAL HIGH (ref 65–99)
HCT: 31 % — ABNORMAL LOW (ref 36.0–46.0)
HCT: 23 % — ABNORMAL LOW (ref 36.0–46.0)
HCT: 26 % — ABNORMAL LOW (ref 36.0–46.0)
HCT: 27 % — ABNORMAL LOW (ref 36.0–46.0)
HEMATOCRIT: 25 % — AB (ref 36.0–46.0)
HEMATOCRIT: 18 % — AB (ref 36.0–46.0)
HEMATOCRIT: 25 % — AB (ref 36.0–46.0)
HEMOGLOBIN: 10.5 g/dL — AB (ref 12.0–15.0)
HEMOGLOBIN: 8.5 g/dL — AB (ref 12.0–15.0)
HEMOGLOBIN: 6.1 g/dL — AB (ref 12.0–15.0)
Hemoglobin: 7.8 g/dL — ABNORMAL LOW (ref 12.0–15.0)
Hemoglobin: 8.8 g/dL — ABNORMAL LOW (ref 12.0–15.0)
Hemoglobin: 8.5 g/dL — ABNORMAL LOW (ref 12.0–15.0)
Hemoglobin: 9.2 g/dL — ABNORMAL LOW (ref 12.0–15.0)
POTASSIUM: 3.8 mmol/L (ref 3.5–5.1)
POTASSIUM: 3.3 mmol/L — AB (ref 3.5–5.1)
POTASSIUM: 4.3 mmol/L (ref 3.5–5.1)
POTASSIUM: 4.3 mmol/L (ref 3.5–5.1)
Potassium: 3.7 mmol/L (ref 3.5–5.1)
Potassium: 4.2 mmol/L (ref 3.5–5.1)
Potassium: 4.2 mmol/L (ref 3.5–5.1)
SODIUM: 140 mmol/L (ref 135–145)
SODIUM: 139 mmol/L (ref 135–145)
SODIUM: 136 mmol/L (ref 135–145)
SODIUM: 136 mmol/L (ref 135–145)
SODIUM: 139 mmol/L (ref 135–145)
SODIUM: 140 mmol/L (ref 135–145)
Sodium: 134 mmol/L — ABNORMAL LOW (ref 135–145)
TCO2: 30 mmol/L (ref 0–100)
TCO2: 29 mmol/L (ref 0–100)
TCO2: 24 mmol/L (ref 0–100)
TCO2: 28 mmol/L (ref 0–100)
TCO2: 26 mmol/L (ref 0–100)
TCO2: 27 mmol/L (ref 0–100)
TCO2: 25 mmol/L (ref 0–100)

## 2015-09-19 LAB — GLUCOSE, CAPILLARY
GLUCOSE-CAPILLARY: 59 mg/dL — AB (ref 65–99)
GLUCOSE-CAPILLARY: 113 mg/dL — AB (ref 65–99)
Glucose-Capillary: 124 mg/dL — ABNORMAL HIGH (ref 65–99)
Glucose-Capillary: 130 mg/dL — ABNORMAL HIGH (ref 65–99)
Glucose-Capillary: 113 mg/dL — ABNORMAL HIGH (ref 65–99)
Glucose-Capillary: 111 mg/dL — ABNORMAL HIGH (ref 65–99)

## 2015-09-19 LAB — BASIC METABOLIC PANEL
Anion gap: 11 (ref 5–15)
BUN: 18 mg/dL (ref 6–20)
CO2: 26 mmol/L (ref 22–32)
Calcium: 8.3 mg/dL — ABNORMAL LOW (ref 8.9–10.3)
Chloride: 104 mmol/L (ref 101–111)
Creatinine, Ser: 1.62 mg/dL — ABNORMAL HIGH (ref 0.44–1.00)
GFR calc Af Amer: 33 mL/min — ABNORMAL LOW (ref >60)
GFR calc non Af Amer: 28 mL/min — ABNORMAL LOW (ref >60)
Glucose, Bld: 100 mg/dL — ABNORMAL HIGH (ref 65–99)
Potassium: 3.8 mmol/L (ref 3.5–5.1)
Sodium: 141 mmol/L (ref 135–145)

## 2015-09-19 LAB — CBC
HCT: 32.1 % — ABNORMAL LOW (ref 36.0–46.0)
HCT: 32.1 % — ABNORMAL LOW (ref 36.0–46.0)
HCT: 27.7 % — ABNORMAL LOW (ref 36.0–46.0)
HEMOGLOBIN: 10.6 g/dL — AB (ref 12.0–15.0)
Hemoglobin: 10.2 g/dL — ABNORMAL LOW (ref 12.0–15.0)
Hemoglobin: 9 g/dL — ABNORMAL LOW (ref 12.0–15.0)
MCH: 27.8 pg (ref 26.0–34.0)
MCH: 28.5 pg (ref 26.0–34.0)
MCH: 28.5 pg (ref 26.0–34.0)
MCHC: 31.8 g/dL (ref 30.0–36.0)
MCHC: 33 g/dL (ref 30.0–36.0)
MCHC: 32.5 g/dL (ref 30.0–36.0)
MCV: 87.5 fL (ref 78.0–100.0)
MCV: 86.3 fL (ref 78.0–100.0)
MCV: 87.7 fL (ref 78.0–100.0)
Platelets: 202 10*3/uL (ref 150–400)
Platelets: 109 10*3/uL — ABNORMAL LOW (ref 150–400)
Platelets: 107 10*3/uL — ABNORMAL LOW (ref 150–400)
RBC: 3.67 MIL/uL — ABNORMAL LOW (ref 3.87–5.11)
RBC: 3.72 MIL/uL — AB (ref 3.87–5.11)
RBC: 3.16 MIL/uL — ABNORMAL LOW (ref 3.87–5.11)
RDW: 14.4 % (ref 11.5–15.5)
RDW: 14.3 % (ref 11.5–15.5)
RDW: 14.3 % (ref 11.5–15.5)
WBC: 7.3 10*3/uL (ref 4.0–10.5)
WBC: 15.1 10*3/uL — ABNORMAL HIGH (ref 4.0–10.5)
WBC: 11.4 10*3/uL — ABNORMAL HIGH (ref 4.0–10.5)

## 2015-09-19 LAB — POCT I-STAT 4, (NA,K, GLUC, HGB,HCT)
GLUCOSE: 150 mg/dL — AB (ref 65–99)
HCT: 33 % — ABNORMAL LOW (ref 36.0–46.0)
Hemoglobin: 11.2 g/dL — ABNORMAL LOW (ref 12.0–15.0)
POTASSIUM: 3.9 mmol/L (ref 3.5–5.1)
Sodium: 139 mmol/L (ref 135–145)

## 2015-09-19 LAB — PROTIME-INR
INR: 1.53 — ABNORMAL HIGH (ref 0.00–1.49)
PROTHROMBIN TIME: 18.4 s — AB (ref 11.6–15.2)

## 2015-09-19 LAB — SURGICAL PCR SCREEN
MRSA, PCR: NEGATIVE
Staphylococcus aureus: NEGATIVE

## 2015-09-19 LAB — CREATININE, SERUM
Creatinine, Ser: 1.61 mg/dL — ABNORMAL HIGH (ref 0.44–1.00)
GFR calc Af Amer: 33 mL/min — ABNORMAL LOW (ref >60)
GFR calc non Af Amer: 28 mL/min — ABNORMAL LOW (ref >60)

## 2015-09-19 LAB — APTT: aPTT: 35 seconds (ref 24–37)

## 2015-09-19 LAB — HEMOGLOBIN AND HEMATOCRIT, BLOOD
HCT: 24.2 % — ABNORMAL LOW (ref 36.0–46.0)
Hemoglobin: 8 g/dL — ABNORMAL LOW (ref 12.0–15.0)

## 2015-09-19 LAB — PREPARE RBC (CROSSMATCH)

## 2015-09-19 LAB — MAGNESIUM: Magnesium: 2.1 mg/dL (ref 1.7–2.4)

## 2015-09-19 LAB — PLATELET COUNT: Platelets: 122 10*3/uL — ABNORMAL LOW (ref 150–400)

## 2015-09-19 SURGERY — CORONARY ARTERY BYPASS GRAFTING (CABG)
Anesthesia: General | Site: Chest

## 2015-09-19 MED ORDER — ALBUTEROL SULFATE HFA 108 (90 BASE) MCG/ACT IN AERS
INHALATION_SPRAY | RESPIRATORY_TRACT | Status: DC | PRN
  Administered 2015-09-19: 3 via RESPIRATORY_TRACT

## 2015-09-19 MED ORDER — VANCOMYCIN HCL IN DEXTROSE 1-5 GM/200ML-% IV SOLN
1000.0000 mg | Freq: Once | INTRAVENOUS | Status: AC
Start: 2015-09-19 — End: 2015-09-19
  Administered 2015-09-19: 1000 mg via INTRAVENOUS
  Filled 2015-09-19: qty 200

## 2015-09-19 MED ORDER — ALBUMIN HUMAN 5 % IV SOLN
250.0000 mL | INTRAVENOUS | Status: AC | PRN
  Administered 2015-09-19 (×2): 250 mL via INTRAVENOUS
  Filled 2015-09-19: qty 250

## 2015-09-19 MED ORDER — NITROGLYCERIN IN D5W 200-5 MCG/ML-% IV SOLN
INTRAVENOUS | Status: DC | PRN
  Administered 2015-09-19: 5 ug/min via INTRAVENOUS

## 2015-09-19 MED ORDER — FENTANYL CITRATE (PF) 250 MCG/5ML IJ SOLN
INTRAMUSCULAR | Status: AC
  Filled 2015-09-19: qty 25

## 2015-09-19 MED ORDER — FENTANYL CITRATE (PF) 100 MCG/2ML IJ SOLN
INTRAMUSCULAR | Status: DC | PRN
  Administered 2015-09-19: 100 ug via INTRAVENOUS
  Administered 2015-09-19: 50 ug via INTRAVENOUS
  Administered 2015-09-19 (×5): 250 ug via INTRAVENOUS
  Administered 2015-09-19: 100 ug via INTRAVENOUS

## 2015-09-19 MED ORDER — VANCOMYCIN HCL 1000 MG IV SOLR
1000.0000 mg | INTRAVENOUS | Status: DC | PRN
  Administered 2015-09-19: 1250 mg via INTRAVENOUS

## 2015-09-19 MED ORDER — METOPROLOL TARTRATE 12.5 MG HALF TABLET
12.5000 mg | ORAL_TABLET | Freq: Two times a day (BID) | ORAL | Status: DC
  Administered 2015-09-20 – 2015-09-22 (×3): 12.5 mg via ORAL
  Filled 2015-09-19 (×4): qty 1

## 2015-09-19 MED ORDER — PLASMA-LYTE 148 IV SOLN
INTRAVENOUS | Status: DC | PRN
  Administered 2015-09-19: 500 mL via INTRAVASCULAR

## 2015-09-19 MED ORDER — MORPHINE SULFATE (PF) 2 MG/ML IV SOLN: 1.0000 mg | INTRAVENOUS | Status: AC | PRN

## 2015-09-19 MED ORDER — OXYCODONE HCL 5 MG PO TABS
5.0000 mg | ORAL_TABLET | ORAL | Status: DC | PRN
  Filled 2015-09-19: qty 1

## 2015-09-19 MED ORDER — SODIUM CHLORIDE 0.45 % IV SOLN
INTRAVENOUS | Status: DC | PRN
  Administered 2015-09-19: 16:00:00 via INTRAVENOUS

## 2015-09-19 MED ORDER — ANTISEPTIC ORAL RINSE SOLUTION (CORINZ)
7.0000 mL | Freq: Four times a day (QID) | OROMUCOSAL | Status: DC
  Administered 2015-09-20 – 2015-09-28 (×31): 7 mL via OROMUCOSAL

## 2015-09-19 MED ORDER — ACETAMINOPHEN 500 MG PO TABS
1000.0000 mg | ORAL_TABLET | Freq: Four times a day (QID) | ORAL | Status: DC
  Administered 2015-09-20 (×2): 1000 mg via ORAL
  Filled 2015-09-19 (×2): qty 2

## 2015-09-19 MED ORDER — LACTATED RINGERS IV SOLN: INTRAVENOUS | Status: DC

## 2015-09-19 MED ORDER — LACTATED RINGERS IV SOLN
INTRAVENOUS | Status: DC | PRN
  Administered 2015-09-19: 08:00:00 via INTRAVENOUS

## 2015-09-19 MED ORDER — SODIUM CHLORIDE 0.9 % IV SOLN
INTRAVENOUS | Status: DC
  Administered 2015-09-19: 19:00:00 via INTRAVENOUS
  Filled 2015-09-19: qty 2.5

## 2015-09-19 MED ORDER — DEXTROSE 5 % IV SOLN
1.5000 g | INTRAVENOUS | Status: DC | PRN
  Administered 2015-09-19: .5 g via INTRAVENOUS
  Administered 2015-09-19: 1.5 g via INTRAVENOUS

## 2015-09-19 MED ORDER — VECURONIUM BROMIDE 10 MG IV SOLR
INTRAVENOUS | Status: DC | PRN
  Administered 2015-09-19 (×2): 4 mg via INTRAVENOUS
  Administered 2015-09-19: 6 mg via INTRAVENOUS
  Administered 2015-09-19 (×2): 3 mg via INTRAVENOUS

## 2015-09-19 MED ORDER — PANTOPRAZOLE SODIUM 40 MG PO TBEC
40.0000 mg | DELAYED_RELEASE_TABLET | Freq: Every day | ORAL | Status: DC
  Administered 2015-09-21 – 2015-09-22 (×2): 40 mg via ORAL
  Filled 2015-09-19 (×3): qty 1

## 2015-09-19 MED ORDER — LEVALBUTEROL HCL 0.63 MG/3ML IN NEBU
0.6300 mg | INHALATION_SOLUTION | Freq: Three times a day (TID) | RESPIRATORY_TRACT | Status: DC
  Administered 2015-09-19 – 2015-09-26 (×21): 0.63 mg via RESPIRATORY_TRACT
  Filled 2015-09-19 (×20): qty 3

## 2015-09-19 MED ORDER — SODIUM CHLORIDE 0.9 % IJ SOLN
OROMUCOSAL | Status: DC | PRN
  Administered 2015-09-19: 4 mL via TOPICAL

## 2015-09-19 MED ORDER — BISACODYL 5 MG PO TBEC
10.0000 mg | DELAYED_RELEASE_TABLET | Freq: Every day | ORAL | Status: DC
  Administered 2015-09-20 – 2015-09-22 (×3): 10 mg via ORAL
  Filled 2015-09-19 (×3): qty 2

## 2015-09-19 MED ORDER — DEXTROSE 50 % IV SOLN
INTRAVENOUS | Status: AC
  Filled 2015-09-19: qty 50

## 2015-09-19 MED ORDER — CHLORHEXIDINE GLUCONATE 0.12% ORAL RINSE (MEDLINE KIT)
15.0000 mL | Freq: Two times a day (BID) | OROMUCOSAL | Status: DC
  Administered 2015-09-19 – 2015-09-27 (×16): 15 mL via OROMUCOSAL

## 2015-09-19 MED ORDER — METOPROLOL TARTRATE 1 MG/ML IV SOLN
2.5000 mg | INTRAVENOUS | Status: DC | PRN
  Administered 2015-09-27: 5 mg via INTRAVENOUS
  Filled 2015-09-19: qty 5

## 2015-09-19 MED ORDER — PROTAMINE SULFATE 10 MG/ML IV SOLN
INTRAVENOUS | Status: DC | PRN
  Administered 2015-09-19: 250 mg via INTRAVENOUS

## 2015-09-19 MED ORDER — FAMOTIDINE IN NACL 20-0.9 MG/50ML-% IV SOLN
20.0000 mg | Freq: Two times a day (BID) | INTRAVENOUS | Status: AC
  Administered 2015-09-19 (×2): 20 mg via INTRAVENOUS
  Filled 2015-09-19: qty 50

## 2015-09-19 MED ORDER — MAGNESIUM SULFATE 4 GM/100ML IV SOLN: 4.0000 g | Freq: Once | INTRAVENOUS | Status: DC

## 2015-09-19 MED ORDER — HEPARIN SODIUM (PORCINE) 1000 UNIT/ML IJ SOLN
INTRAMUSCULAR | Status: DC | PRN
  Administered 2015-09-19: 25000 [IU] via INTRAVENOUS

## 2015-09-19 MED ORDER — HEMOSTATIC AGENTS (NO CHARGE) OPTIME
TOPICAL | Status: DC | PRN
  Administered 2015-09-19 (×2): 1 via TOPICAL

## 2015-09-19 MED ORDER — MORPHINE SULFATE (PF) 2 MG/ML IV SOLN
2.0000 mg | INTRAVENOUS | Status: DC | PRN
  Administered 2015-09-20 – 2015-09-21 (×2): 2 mg via INTRAVENOUS
  Filled 2015-09-19 (×2): qty 1

## 2015-09-19 MED ORDER — ASPIRIN EC 325 MG PO TBEC
325.0000 mg | DELAYED_RELEASE_TABLET | Freq: Every day | ORAL | Status: DC
  Administered 2015-09-20 – 2015-09-27 (×4): 325 mg via ORAL
  Filled 2015-09-19 (×5): qty 1

## 2015-09-19 MED ORDER — DEXMEDETOMIDINE HCL IN NACL 200 MCG/50ML IV SOLN
0.0000 ug/kg/h | INTRAVENOUS | Status: DC
  Administered 2015-09-19: 0.5 ug/kg/h via INTRAVENOUS
  Filled 2015-09-19: qty 50

## 2015-09-19 MED ORDER — ROCURONIUM BROMIDE 100 MG/10ML IV SOLN
INTRAVENOUS | Status: DC | PRN
  Administered 2015-09-19: 50 mg via INTRAVENOUS

## 2015-09-19 MED ORDER — DOCUSATE SODIUM 100 MG PO CAPS
200.0000 mg | ORAL_CAPSULE | Freq: Every day | ORAL | Status: DC
  Administered 2015-09-20 – 2015-09-22 (×3): 200 mg via ORAL
  Filled 2015-09-19 (×3): qty 2

## 2015-09-19 MED ORDER — DEXMEDETOMIDINE HCL IN NACL 400 MCG/100ML IV SOLN
INTRAVENOUS | Status: DC | PRN
  Administered 2015-09-19: .2 ug/kg/h via INTRAVENOUS

## 2015-09-19 MED ORDER — MIDAZOLAM HCL 2 MG/2ML IJ SOLN: 2.0000 mg | INTRAMUSCULAR | Status: DC | PRN

## 2015-09-19 MED ORDER — BISACODYL 10 MG RE SUPP
10.0000 mg | Freq: Every day | RECTAL | Status: DC
  Administered 2015-09-26: 10 mg via RECTAL

## 2015-09-19 MED ORDER — FENTANYL CITRATE (PF) 250 MCG/5ML IJ SOLN
INTRAMUSCULAR | Status: AC
  Filled 2015-09-19: qty 5

## 2015-09-19 MED ORDER — ACETAMINOPHEN 160 MG/5ML PO SOLN: 650.0000 mg | Freq: Once | ORAL | Status: AC

## 2015-09-19 MED ORDER — ASPIRIN 81 MG PO CHEW
324.0000 mg | CHEWABLE_TABLET | Freq: Every day | ORAL | Status: DC
  Administered 2015-09-24 – 2015-09-26 (×3): 324 mg
  Filled 2015-09-19 (×3): qty 4

## 2015-09-19 MED ORDER — ALBUMIN HUMAN 5 % IV SOLN
INTRAVENOUS | Status: DC | PRN
Start: 2015-09-19 — End: 2015-09-19
  Administered 2015-09-19: 15:00:00 via INTRAVENOUS

## 2015-09-19 MED ORDER — MIDAZOLAM HCL 5 MG/5ML IJ SOLN
INTRAMUSCULAR | Status: DC | PRN
  Administered 2015-09-19: 3 mg via INTRAVENOUS
  Administered 2015-09-19: 2 mg via INTRAVENOUS
  Administered 2015-09-19: 1 mg via INTRAVENOUS
  Administered 2015-09-19: 4 mg via INTRAVENOUS

## 2015-09-19 MED ORDER — SODIUM CHLORIDE 0.9 % IV SOLN
250.0000 [IU] | INTRAVENOUS | Status: DC | PRN
  Administered 2015-09-19: 1.2 [IU]/h via INTRAVENOUS

## 2015-09-19 MED ORDER — LACTATED RINGERS IV SOLN: 500.0000 mL | Freq: Once | INTRAVENOUS | Status: DC | PRN

## 2015-09-19 MED ORDER — DOPAMINE-DEXTROSE 3.2-5 MG/ML-% IV SOLN
INTRAVENOUS | Status: DC | PRN
  Administered 2015-09-19: 3 ug/kg/min via INTRAVENOUS

## 2015-09-19 MED ORDER — ONDANSETRON HCL 4 MG/2ML IJ SOLN
4.0000 mg | Freq: Four times a day (QID) | INTRAMUSCULAR | Status: DC | PRN
  Administered 2015-09-20 – 2015-09-25 (×2): 4 mg via INTRAVENOUS
  Filled 2015-09-19 (×2): qty 2

## 2015-09-19 MED ORDER — PROPOFOL 10 MG/ML IV BOLUS
INTRAVENOUS | Status: DC | PRN
  Administered 2015-09-19: 120 mg via INTRAVENOUS

## 2015-09-19 MED ORDER — TRAMADOL HCL 50 MG PO TABS: 50.0000 mg | ORAL_TABLET | ORAL | Status: DC | PRN

## 2015-09-19 MED ORDER — NITROGLYCERIN IN D5W 200-5 MCG/ML-% IV SOLN: 0.0000 ug/min | INTRAVENOUS | Status: DC

## 2015-09-19 MED ORDER — METOPROLOL TARTRATE 25 MG/10 ML ORAL SUSPENSION
12.5000 mg | Freq: Two times a day (BID) | ORAL | Status: DC
  Administered 2015-09-20: 12.5 mg
  Filled 2015-09-19 (×2): qty 5

## 2015-09-19 MED ORDER — SODIUM CHLORIDE 0.9 % IV SOLN: INTRAVENOUS | Status: DC

## 2015-09-19 MED ORDER — AMINOCAPROIC ACID 250 MG/ML IV SOLN
INTRAVENOUS | Status: DC | PRN
  Administered 2015-09-19: 5 g via INTRAVENOUS

## 2015-09-19 MED ORDER — POTASSIUM CHLORIDE 10 MEQ/50ML IV SOLN: 10.0000 meq | INTRAVENOUS | Status: AC

## 2015-09-19 MED ORDER — SODIUM CHLORIDE 0.9 % IV SOLN
250.0000 mL | INTRAVENOUS | Status: DC
  Administered 2015-09-20 – 2015-09-24 (×2): 250 mL via INTRAVENOUS

## 2015-09-19 MED ORDER — MIDAZOLAM HCL 10 MG/2ML IJ SOLN
INTRAMUSCULAR | Status: AC
  Filled 2015-09-19: qty 2

## 2015-09-19 MED ORDER — DOPAMINE-DEXTROSE 3.2-5 MG/ML-% IV SOLN
3.0000 ug/kg/min | INTRAVENOUS | Status: DC
  Administered 2015-09-20 – 2015-09-24 (×3): 3 ug/kg/min via INTRAVENOUS
  Filled 2015-09-19 (×3): qty 250

## 2015-09-19 MED ORDER — DEXTROSE 5 % IV SOLN
1.5000 g | Freq: Two times a day (BID) | INTRAVENOUS | Status: AC
  Administered 2015-09-20 – 2015-09-21 (×4): 1.5 g via INTRAVENOUS
  Filled 2015-09-19 (×4): qty 1.5

## 2015-09-19 MED ORDER — ACETAMINOPHEN 650 MG RE SUPP
650.0000 mg | Freq: Once | RECTAL | Status: AC
  Administered 2015-09-19: 650 mg via RECTAL

## 2015-09-19 MED ORDER — SODIUM CHLORIDE 0.9% FLUSH
3.0000 mL | Freq: Two times a day (BID) | INTRAVENOUS | Status: DC
  Administered 2015-09-21 – 2015-09-25 (×6): 3 mL via INTRAVENOUS

## 2015-09-19 MED ORDER — INSULIN REGULAR BOLUS VIA INFUSION
0.0000 [IU] | Freq: Three times a day (TID) | INTRAVENOUS | Status: DC
  Filled 2015-09-19: qty 10

## 2015-09-19 MED ORDER — PROPOFOL 10 MG/ML IV BOLUS
INTRAVENOUS | Status: AC
  Filled 2015-09-19: qty 40

## 2015-09-19 MED ORDER — PHENYLEPHRINE HCL 10 MG/ML IJ SOLN
10.0000 mg | INTRAVENOUS | Status: DC | PRN
  Administered 2015-09-19: 40 ug/min via INTRAVENOUS

## 2015-09-19 MED ORDER — METOCLOPRAMIDE HCL 5 MG/ML IJ SOLN
10.0000 mg | Freq: Four times a day (QID) | INTRAMUSCULAR | Status: AC
  Administered 2015-09-19 – 2015-09-20 (×4): 10 mg via INTRAVENOUS
  Filled 2015-09-19 (×3): qty 2

## 2015-09-19 MED ORDER — SODIUM CHLORIDE 0.9 % IV SOLN
10.0000 g | INTRAVENOUS | Status: DC | PRN
  Administered 2015-09-19: 1 g/h via INTRAVENOUS

## 2015-09-19 MED ORDER — SODIUM CHLORIDE 0.9% FLUSH
3.0000 mL | INTRAVENOUS | Status: DC | PRN
  Administered 2015-09-26: 10 mL via INTRAVENOUS
  Filled 2015-09-19: qty 3

## 2015-09-19 MED ORDER — CHLORHEXIDINE GLUCONATE 0.12 % MT SOLN
15.0000 mL | OROMUCOSAL | Status: AC
  Administered 2015-09-19: 15 mL via OROMUCOSAL

## 2015-09-19 MED ORDER — DEXTROSE 5 % IV SOLN
0.0000 ug/min | INTRAVENOUS | Status: DC
  Administered 2015-09-19: 10 ug/min via INTRAVENOUS
  Filled 2015-09-19: qty 2

## 2015-09-19 MED ORDER — ACETAMINOPHEN 160 MG/5ML PO SOLN
1000.0000 mg | Freq: Four times a day (QID) | ORAL | Status: DC
  Administered 2015-09-19: 1000 mg
  Filled 2015-09-19: qty 40.6

## 2015-09-19 MED FILL — Potassium Chloride Inj 2 mEq/ML: INTRAVENOUS | Qty: 40 | Status: AC

## 2015-09-19 MED FILL — Heparin Sodium (Porcine) Inj 1000 Unit/ML: INTRAMUSCULAR | Qty: 30 | Status: AC

## 2015-09-19 MED FILL — Magnesium Sulfate Inj 50%: INTRAMUSCULAR | Qty: 10 | Status: AC

## 2015-09-19 SURGICAL SUPPLY — 78 items
BAG DECANTER FOR FLEXI CONT (MISCELLANEOUS) ×3 IMPLANT
BANDAGE ACE 4X5 VEL STRL LF (GAUZE/BANDAGES/DRESSINGS) ×6 IMPLANT
BANDAGE ACE 6X5 VEL STRL LF (GAUZE/BANDAGES/DRESSINGS) ×6 IMPLANT
BANDAGE ELASTIC 4 VELCRO ST LF (GAUZE/BANDAGES/DRESSINGS) ×3 IMPLANT
BANDAGE ELASTIC 6 VELCRO ST LF (GAUZE/BANDAGES/DRESSINGS) ×3 IMPLANT
BLADE STERNUM SYSTEM 6 (BLADE) ×3 IMPLANT
BLADE SURG 11 STRL SS (BLADE) ×3 IMPLANT
BNDG GAUZE ELAST 4 BULKY (GAUZE/BANDAGES/DRESSINGS) ×6 IMPLANT
CANISTER SUCTION 2500CC (MISCELLANEOUS) ×3 IMPLANT
CANNULA VEN 2 STAGE (MISCELLANEOUS) ×3 IMPLANT
CATH CPB KIT GERHARDT (MISCELLANEOUS) ×3 IMPLANT
CATH THORACIC 28FR (CATHETERS) ×3 IMPLANT
CLIP FOGARTY SPRING 6M (CLIP) ×3 IMPLANT
CRADLE DONUT ADULT HEAD (MISCELLANEOUS) ×3 IMPLANT
DERMABOND ADHESIVE PROPEN (GAUZE/BANDAGES/DRESSINGS) ×2
DERMABOND ADVANCED .7 DNX6 (GAUZE/BANDAGES/DRESSINGS) ×4 IMPLANT
DRAIN CHANNEL 28F RND 3/8 FF (WOUND CARE) ×3 IMPLANT
DRAPE CARDIOVASCULAR INCISE (DRAPES) ×1
DRAPE SLUSH/WARMER DISC (DRAPES) ×3 IMPLANT
DRAPE SRG 135X102X78XABS (DRAPES) ×2 IMPLANT
DRSG AQUACEL AG ADV 3.5X14 (GAUZE/BANDAGES/DRESSINGS) ×3 IMPLANT
ELECT BLADE 4.0 EZ CLEAN MEGAD (MISCELLANEOUS) ×3
ELECT REM PT RETURN 9FT ADLT (ELECTROSURGICAL) ×6
ELECTRODE BLDE 4.0 EZ CLN MEGD (MISCELLANEOUS) ×2 IMPLANT
ELECTRODE REM PT RTRN 9FT ADLT (ELECTROSURGICAL) ×4 IMPLANT
FELT TEFLON 1X6 (MISCELLANEOUS) ×3 IMPLANT
GAUZE SPONGE 4X4 12PLY STRL (GAUZE/BANDAGES/DRESSINGS) ×6 IMPLANT
GLOVE BIO SURGEON STRL SZ 6 (GLOVE) ×3 IMPLANT
GLOVE BIO SURGEON STRL SZ 6.5 (GLOVE) ×30 IMPLANT
GLOVE BIO SURGEON STRL SZ7 (GLOVE) ×6 IMPLANT
GLOVE BIO SURGEON STRL SZ8 (GLOVE) ×3 IMPLANT
GLOVE BIOGEL PI IND STRL 6 (GLOVE) ×4 IMPLANT
GLOVE BIOGEL PI INDICATOR 6 (GLOVE) ×2
GOWN STRL REUS W/ TWL LRG LVL3 (GOWN DISPOSABLE) ×22 IMPLANT
GOWN STRL REUS W/TWL LRG LVL3 (GOWN DISPOSABLE) ×11
HEMOSTAT POWDER SURGIFOAM 1G (HEMOSTASIS) ×9 IMPLANT
HEMOSTAT SURGICEL 2X14 (HEMOSTASIS) ×3 IMPLANT
KIT BASIN OR (CUSTOM PROCEDURE TRAY) ×3 IMPLANT
KIT CATH SUCT 8FR (CATHETERS) ×3 IMPLANT
KIT DRAINAGE VACCUM ASSIST (KITS) ×3 IMPLANT
KIT ROOM TURNOVER OR (KITS) ×3 IMPLANT
KIT SUCTION CATH 14FR (SUCTIONS) ×6 IMPLANT
KIT VASOVIEW 6 PRO VH 2400 (KITS) ×3 IMPLANT
LEAD PACING MYOCARDI (MISCELLANEOUS) ×3 IMPLANT
MARKER GRAFT CORONARY BYPASS (MISCELLANEOUS) ×9 IMPLANT
NS IRRIG 1000ML POUR BTL (IV SOLUTION) ×18 IMPLANT
PACK OPEN HEART (CUSTOM PROCEDURE TRAY) ×3 IMPLANT
PAD ARMBOARD 7.5X6 YLW CONV (MISCELLANEOUS) ×6 IMPLANT
PAD ELECT DEFIB RADIOL ZOLL (MISCELLANEOUS) ×3 IMPLANT
PENCIL BUTTON HOLSTER BLD 10FT (ELECTRODE) ×3 IMPLANT
PUNCH AORTIC ROTATE  4.5MM 8IN (MISCELLANEOUS) ×3 IMPLANT
SET CARDIOPLEGIA MPS 5001102 (MISCELLANEOUS) ×3 IMPLANT
SOLUTION ANTI FOG 6CC (MISCELLANEOUS) ×3 IMPLANT
SPONGE LAP 18X18 X RAY DECT (DISPOSABLE) ×3 IMPLANT
SUT BONE WAX W31G (SUTURE) ×3 IMPLANT
SUT MNCRL AB 4-0 PS2 18 (SUTURE) ×3 IMPLANT
SUT PROLENE 3 0 SH1 36 (SUTURE) ×6 IMPLANT
SUT PROLENE 4 0 TF (SUTURE) ×9 IMPLANT
SUT PROLENE 5 0 C 1 36 (SUTURE) ×3 IMPLANT
SUT PROLENE 6 0 C 1 30 (SUTURE) ×3 IMPLANT
SUT PROLENE 6 0 CC (SUTURE) ×12 IMPLANT
SUT PROLENE 7 0 BV 1 (SUTURE) ×3 IMPLANT
SUT PROLENE 7 0 BV1 MDA (SUTURE) ×6 IMPLANT
SUT PROLENE 8 0 BV175 6 (SUTURE) ×15 IMPLANT
SUT STEEL 6MS V (SUTURE) ×3 IMPLANT
SUT STEEL SZ 6 DBL 3X14 BALL (SUTURE) ×3 IMPLANT
SUT VIC AB 1 CTX 18 (SUTURE) ×6 IMPLANT
SUT VIC AB 2-0 CT1 27 (SUTURE) ×1
SUT VIC AB 2-0 CT1 TAPERPNT 27 (SUTURE) ×2 IMPLANT
SUT VIC AB 3-0 X1 27 (SUTURE) ×3 IMPLANT
SUTURE E-PAK OPEN HEART (SUTURE) ×3 IMPLANT
SYSTEM SAHARA CHEST DRAIN ATS (WOUND CARE) ×3 IMPLANT
TOWEL OR 17X24 6PK STRL BLUE (TOWEL DISPOSABLE) ×6 IMPLANT
TOWEL OR 17X26 10 PK STRL BLUE (TOWEL DISPOSABLE) ×6 IMPLANT
TRAY FOLEY IC TEMP SENS 16FR (CATHETERS) ×3 IMPLANT
TUBING INSUFFLATION (TUBING) ×3 IMPLANT
UNDERPAD 30X30 INCONTINENT (UNDERPADS AND DIAPERS) ×3 IMPLANT
WATER STERILE IRR 1000ML POUR (IV SOLUTION) ×6 IMPLANT

## 2015-09-19 NOTE — Plan of Care (Signed)
Patient s/p CABG surgery today, will be under the care CT surgery service.. I will sign off, please call me if you have any questions.    RAI,RIPUDEEP M.D. [facility] 09/19/2015, 4:58 PM  Pager: [phone]

## 2015-09-19 NOTE — Progress Notes (Signed)
Patient ID: Timolin Lininger, female   DOB: 08-17-1930, 82 y.o.   MRN: [id number] EVENING ROUNDS NOTE :     [address]       [place] [address]             [phone]                 Day of Surgery Procedure(s) (LRB): CORONARY ARTERY BYPASS GRAFTING (CABG) TIMES FOUR USING BILATARAL SAPHENOUS VEIN GRAFTS AND LEFT INTERNAL MAMMARY ARTERY (N/A) TRANSESOPHAGEAL ECHOCARDIOGRAM (TEE) (N/A)  Total Length of Stay:  LOS: 0 days  BP 98/74 mmHg  Pulse 90  Temp(Src) 97.3 F (36.3 C) (Oral)  Resp 12  Ht 5\' 2"  (1.575 m)  Wt 183 lb 3.2 oz (83.099 kg)  BMI 33.50 kg/m2  SpO2 100%  .Intake/Output      03/14 0701 - 03/15 0700 03/15 0701 - 03/16 0700   P.O. 480    I.V. (mL/kg) 3 (0) 2715.4 (32.7)   Blood  508   IV Piggyback  800   Total Intake(mL/kg) 483 (5.8) 4023.4 (48.4)   Urine (mL/kg/hr) 700 (0.4) 905 (0.9)   Blood  2300 (2.3)   Chest Tube  80 (0.1)   Total Output 700 3285   Net -217 +738.4          . sodium chloride 20 mL/hr at 09/19/15 1800  . [START ON 09/20/2015] sodium chloride    . sodium chloride 10 mL/hr at 09/19/15 1800  . dexmedetomidine Stopped (09/19/15 1800)  . DOPamine 5 mcg/kg/min (09/19/15 1700)  . insulin (NOVOLIN-R) infusion 1.6 Units/hr (09/19/15 1800)  . lactated ringers Stopped (09/19/15 1612)  . lactated ringers 20 mL/hr at 09/19/15 1800  . nitroGLYCERIN Stopped (09/19/15 1600)  . phenylephrine (NEO-SYNEPHRINE) Adult infusion 10 mcg/min (09/19/15 1800)     Lab Results  Component Value Date   WBC 15.1* 09/19/2015   HGB 11.2* 09/19/2015   HCT 33.0* 09/19/2015   PLT 109* 09/19/2015   GLUCOSE 150* 09/19/2015   CHOL 139 09/15/2015   TRIG 119 09/15/2015   HDL 35* 09/15/2015   LDLCALC 80 09/15/2015   ALT 20 11/22/2012   AST 22 11/22/2012   NA 139 09/19/2015   K 3.9 09/19/2015   CL 100* 09/19/2015   CREATININE 1.20* 09/19/2015   BUN 17 09/19/2015   CO2 26 09/19/2015   INR 1.53* 09/19/2015   HGBA1C 8.2* 09/15/2015   Early post op,  not bleeding, still very sleppy, opens eys but not breathing on own uop adquate  On dopamine   [name] MD  Beeper [phone] Office [phone] 09/19/2015 6:57 PM

## 2015-09-19 NOTE — Transfer of Care (Signed)
Immediate Anesthesia Transfer of Care Note  Patient: Monica [name]  Procedure(s) Performed: Procedure(s): CORONARY ARTERY BYPASS GRAFTING (CABG) TIMES FOUR USING BILATARAL SAPHENOUS VEIN GRAFTS AND LEFT INTERNAL MAMMARY ARTERY (N/A) TRANSESOPHAGEAL ECHOCARDIOGRAM (TEE) (N/A)  Patient Location: SICU  Anesthesia Type:General  Level of Consciousness: sedated and Patient remains intubated per anesthesia plan  Airway & Oxygen Therapy: Patient remains intubated per anesthesia plan and Patient placed on Ventilator (see vital sign flow sheet for setting)  Post-op Assessment: Report given to RN and Post -op Vital signs reviewed and stable  Post vital signs: Reviewed and stable  Last Vitals:  Filed Vitals:   09/18/15 2138 09/19/15 0526  BP: 153/62 149/52  Pulse: 72 70  Temp: 37.1 C 37.3 C  Resp: 18 18    Complications: No apparent anesthesia complications

## 2015-09-19 NOTE — Progress Notes (Signed)
*  PRELIMINARY RESULTS* Echocardiogram Echocardiogram Transesophageal has been performed.  Leavy Cella 09/19/2015, 9:47 AM

## 2015-09-19 NOTE — Anesthesia Postprocedure Evaluation (Signed)
Anesthesia Post Note  Patient: Monica [name]  Procedure(s) Performed: Procedure(s) (LRB): CORONARY ARTERY BYPASS GRAFTING (CABG) TIMES FOUR USING BILATARAL SAPHENOUS VEIN GRAFTS AND LEFT INTERNAL MAMMARY ARTERY (N/A) TRANSESOPHAGEAL ECHOCARDIOGRAM (TEE) (N/A)  Patient location during evaluation: ICU Anesthesia Type: General Level of consciousness: patient remains intubated per anesthesia plan Pain management: pain level controlled Vital Signs Assessment: post-procedure vital signs reviewed and stable Respiratory status: patient remains intubated per anesthesia plan Cardiovascular status: stable Postop Assessment: no signs of nausea or vomiting Anesthetic complications: no    Last Vitals:  Filed Vitals:   09/19/15 1705 09/19/15 1730  BP:  118/68  Pulse: 90 90  Temp: 36.4 C 36.3 C  Resp: 12 12    Last Pain:  Filed Vitals:   09/19/15 1738  PainSc: 0-No pain                 Effie Berkshire

## 2015-09-19 NOTE — Progress Notes (Signed)
Patient tolerating PSV well.  MV and RR within acceptable limits.  Patient able to lift head from pillow. Unable to follow prompts for NIF and VC.  When not stimulated patient easily falls back asleep.  ABG outside of protocol limits for extubation.  Patient placed back on resting settings.  RT will coordinate with RN for next wean attempt.

## 2015-09-19 NOTE — Progress Notes (Signed)
Admit: 09/14/2015 LOS: 0  80F with CKD3, stable, s/p 3V CABG 3/15  Subjective:  Just out of CABG Renal function stable  03/14 0701 - 03/15 0700 In: 483 [P.O.:480; I.V.:3] Out: 700 [Urine:700]  Filed Weights   09/15/15 0227 09/17/15 0449 09/19/15 0526  Weight: 84.687 kg (186 lb 11.2 oz) 81.557 kg (179 lb 12.8 oz) 83.099 kg (183 lb 3.2 oz)    Scheduled Meds: . [START ON 09/20/2015] acetaminophen  1,000 mg Oral 4 times per day   Or  . [START ON 09/20/2015] acetaminophen (TYLENOL) oral liquid 160 mg/5 mL  1,000 mg Per Tube 4 times per day  . acetaminophen (TYLENOL) oral liquid 160 mg/5 mL  650 mg Per Tube Once   Or  . acetaminophen  650 mg Rectal Once  . [START ON 09/20/2015] aspirin EC  325 mg Oral Daily   Or  . [START ON 09/20/2015] aspirin  324 mg Per Tube Daily  . atorvastatin  80 mg Oral q1800  . [START ON 09/20/2015] bisacodyl  10 mg Oral Daily   Or  . [START ON 09/20/2015] bisacodyl  10 mg Rectal Daily  . [START ON 09/20/2015] cefUROXime (ZINACEF)  IV  1.5 g Intravenous Q12H  . chlorhexidine  15 mL Mouth/Throat NOW  . dextrose      . dextrose      . dextrose      . [START ON 09/20/2015] docusate sodium  200 mg Oral Daily  . famotidine (PEPCID) IV  20 mg Intravenous Q12H  . insulin regular  0-10 Units Intravenous TID WC  . levalbuterol  0.63 mg Nebulization TID  . magnesium sulfate  4 g Intravenous Once  . metoCLOPramide (REGLAN) injection  10 mg Intravenous 4 times per day  . metoprolol tartrate  12.5 mg Oral BID   Or  . metoprolol tartrate  12.5 mg Per Tube BID  . [START ON 09/21/2015] pantoprazole  40 mg Oral Daily  . potassium chloride  10 mEq Intravenous Q1 Hr x 3  . [START ON 09/20/2015] sodium chloride flush  3 mL Intravenous Q12H  . vancomycin  1,000 mg Intravenous Once   Continuous Infusions: . sodium chloride    . [START ON 09/20/2015] sodium chloride    . sodium chloride    . dexmedetomidine    . DOPamine    . insulin (NOVOLIN-R) infusion    . lactated  ringers    . lactated ringers    . nitroGLYCERIN    . phenylephrine (NEO-SYNEPHRINE) Adult infusion     PRN Meds:.sodium chloride, albumin human, lactated ringers, metoprolol, midazolam, morphine injection, morphine injection, ondansetron (ZOFRAN) IV, oxyCODONE, [START ON 09/20/2015] sodium chloride flush, traMADol  Current Labs: reviewed   Physical Exam:  Blood pressure 149/52, pulse 90, temperature 97.5 F (36.4 C), temperature source Oral, resp. rate 12, height 5\' 2"  (1.575 m), weight 83.099 kg (183 lb 3.2 oz), SpO2 100 %. GEN: intubated,sedated ENT: ETT in place CV: RRR PULM: CTAB, coarse bs ABD: s/nt/nd SKIN: no rashes/lesions EXT:No LEE  A 1. CKD3 presumed 2/2 DM and HTN:  2. CAD, 3V disease, s/p CABG 3/15 3. DM2 4. HTN 5. HLD  P 1. Stable labs, good UOP, will cont to follow Daily weights, Daily Renal Panel, Strict I/Os, Avoid nephrotoxins (NSAIDs, judicious IV Contrast)    Pearson Grippe MD 09/19/2015, 4:05 PM   Recent Labs Lab 09/17/15 0001 09/18/15 0232 09/19/15 0250  09/19/15 1223 09/19/15 1305 09/19/15 1417 09/19/15 1542  NA 141 139 141  < >  134* 136 139 139  K 4.1 4.1 3.8  < > 4.3 4.3 4.2 3.9  CL 106 106 104  < > 100* 99* 100*  --   CO2 24 24 26   --   --   --   --   --   GLUCOSE 156* 196* 100*  < > 138* 154* 154* 150*  BUN 21* 20 18  < > 18 17 17   --   CREATININE 1.49* 1.48* 1.62*  < > 1.40* 1.20* 1.20*  --   CALCIUM 8.5* 8.1* 8.3*  --   --   --   --   --   < > = values in this interval not displayed.  Recent Labs Lab 09/18/15 0232 09/19/15 0250  09/19/15 1305 09/19/15 1417 09/19/15 1540 09/19/15 1542  WBC 7.3 7.3  --   --   --  15.1*  --   HGB 9.6* 10.2*  < > 8.8*  8.0* 8.5* 10.6* 11.2*  HCT 30.9* 32.1*  < > 26.0*  24.2* 25.0* 32.1* 33.0*  MCV 88.0 87.5  --   --   --  86.3  --   PLT 212 202  --  122*  --  PENDING  --   < > = values in this interval not displayed.

## 2015-09-19 NOTE — Progress Notes (Signed)
PT Cancellation Note  Patient Details Name: Monica [name] MRN: [id number] DOB: 1930-10-21   Cancelled Treatment:    Reason Eval/Treat Not Completed: Patient at procedure or test/unavailable Pt off floor in OR. Will follow up post surgery.  Marguarite Arbour A Amin Fornwalt 09/19/2015, 9:22 AM Wray Kearns, [place], DPT [phone]

## 2015-09-19 NOTE — Progress Notes (Signed)
Hypoglycemic Event  CBG: 59  Treatment: D50 IV 25 mL   Symptoms: None  Follow-up CBG: Time:0610 CBG Result 124  Possible Reasons for Event: Inadequate meal intake (pt NPO)    Tinnie Kunin, RN

## 2015-09-19 NOTE — Anesthesia Procedure Notes (Signed)
Central Venous Catheter Insertion Performed by: anesthesiologist 09/19/2015 8:15 AM Patient location: Pre-op. Preanesthetic checklist: patient identified, IV checked, site marked, risks and benefits discussed, surgical consent, monitors and equipment checked, pre-op evaluation, timeout performed and anesthesia consent Lidocaine 1% used for infiltration Landmarks identified Catheter size: 7 Fr Sheath introducer Procedure performed using ultrasound guided technique. Attempts: 1 Following insertion, line sutured and dressing applied. Post procedure assessment: blood return through all ports, free fluid flow and no air. Patient tolerated the procedure well with no immediate complications.

## 2015-09-19 NOTE — Brief Op Note (Addendum)
      [address]       [place] [address]             [phone]     09/19/2015  1:12 PM  PATIENT:  Monica [name]  82 y.o. female  PRE-OPERATIVE DIAGNOSIS:  Coronary artery disease  POST-OPERATIVE DIAGNOSIS:  Coronary artery disease  PROCEDURE:  Procedure(s):  CORONARY ARTERY BYPASS GRAFTING x 4 -LIMA to LAD -SVG to RAMUS -SVG to OM -SVG to DISTAL RCA  ENDOSCOPIC HARVEST GREATER SAPHENOUS VEIN -Left Leg- lower leg portion was of poor size and inadequate for conduit -Right Thigh  TRANSESOPHAGEAL ECHOCARDIOGRAM (TEE) (N/A)  SURGEON:  Surgeon(s) and Role:    * [name], MD - Primary  PHYSICIAN ASSISTANT: Erin Barrett PA-C  ANESTHESIA:   general  EBL:  Total I/O In: -  Out: 520 [Urine:520]  BLOOD ADMINISTERED:1 unit PRBC and CELLSAVER  DRAINS: Left pleural Chest tube, mediastinal chest drains   LOCAL MEDICATIONS USED:  NONE  SPECIMEN:  No Specimen  DISPOSITION OF SPECIMEN:  N/A  COUNTS:  YES   DICTATION: .Dragon Dictation  PLAN OF CARE: Admit to inpatient   PATIENT DISPOSITION:  ICU - intubated and hemodynamically stable.   Delay start of Pharmacological VTE agent (>24hrs) due to surgical blood loss or risk of bleeding: yes

## 2015-09-20 ENCOUNTER — Inpatient Hospital Stay (HOSPITAL_COMMUNITY): Payer: Medicare HMO

## 2015-09-20 ENCOUNTER — Encounter (HOSPITAL_COMMUNITY): Payer: Self-pay | Admitting: Cardiothoracic Surgery

## 2015-09-20 LAB — POCT I-STAT 3, ART BLOOD GAS (G3+)
ACID-BASE DEFICIT: 3 mmol/L — AB (ref 0.0–2.0)
Acid-base deficit: 3 mmol/L — ABNORMAL HIGH (ref 0.0–2.0)
BICARBONATE: 23.3 meq/L (ref 20.0–24.0)
Bicarbonate: 23.1 mEq/L (ref 20.0–24.0)
O2 SAT: 99 %
O2 Saturation: 100 %
PCO2 ART: 44 mmHg (ref 35.0–45.0)
PH ART: 7.275 — AB (ref 7.350–7.450)
TCO2: 24 mmol/L (ref 0–100)
TCO2: 25 mmol/L (ref 0–100)
pCO2 arterial: 50.5 mmHg — ABNORMAL HIGH (ref 35.0–45.0)
pH, Arterial: 7.332 — ABNORMAL LOW (ref 7.350–7.450)
pO2, Arterial: 158 mmHg — ABNORMAL HIGH (ref 80.0–100.0)
pO2, Arterial: 192 mmHg — ABNORMAL HIGH (ref 80.0–100.0)

## 2015-09-20 LAB — BASIC METABOLIC PANEL
ANION GAP: 11 (ref 5–15)
BUN: 20 mg/dL (ref 6–20)
CHLORIDE: 107 mmol/L (ref 101–111)
CO2: 22 mmol/L (ref 22–32)
Calcium: 7.1 mg/dL — ABNORMAL LOW (ref 8.9–10.3)
Creatinine, Ser: 1.98 mg/dL — ABNORMAL HIGH (ref 0.44–1.00)
GFR calc Af Amer: 26 mL/min — ABNORMAL LOW (ref >60)
GFR, EST NON AFRICAN AMERICAN: 22 mL/min — AB (ref >60)
GLUCOSE: 118 mg/dL — AB (ref 65–99)
POTASSIUM: 4.3 mmol/L (ref 3.5–5.1)
SODIUM: 140 mmol/L (ref 135–145)

## 2015-09-20 LAB — CBC
HCT: 26.6 % — ABNORMAL LOW (ref 36.0–46.0)
HCT: 26.4 % — ABNORMAL LOW (ref 36.0–46.0)
HEMOGLOBIN: 8.6 g/dL — AB (ref 12.0–15.0)
Hemoglobin: 8.3 g/dL — ABNORMAL LOW (ref 12.0–15.0)
MCH: 28.5 pg (ref 26.0–34.0)
MCH: 28.1 pg (ref 26.0–34.0)
MCHC: 32.3 g/dL (ref 30.0–36.0)
MCHC: 31.4 g/dL (ref 30.0–36.0)
MCV: 88.1 fL (ref 78.0–100.0)
MCV: 89.5 fL (ref 78.0–100.0)
PLATELETS: 112 10*3/uL — AB (ref 150–400)
PLATELETS: 110 10*3/uL — AB (ref 150–400)
RBC: 3.02 MIL/uL — AB (ref 3.87–5.11)
RBC: 2.95 MIL/uL — AB (ref 3.87–5.11)
RDW: 14.6 % (ref 11.5–15.5)
RDW: 14.7 % (ref 11.5–15.5)
WBC: 11.1 10*3/uL — ABNORMAL HIGH (ref 4.0–10.5)
WBC: 15.1 10*3/uL — AB (ref 4.0–10.5)

## 2015-09-20 LAB — GLUCOSE, CAPILLARY
GLUCOSE-CAPILLARY: 104 mg/dL — AB (ref 65–99)
GLUCOSE-CAPILLARY: 120 mg/dL — AB (ref 65–99)
GLUCOSE-CAPILLARY: 128 mg/dL — AB (ref 65–99)
GLUCOSE-CAPILLARY: 115 mg/dL — AB (ref 65–99)
GLUCOSE-CAPILLARY: 105 mg/dL — AB (ref 65–99)
GLUCOSE-CAPILLARY: 102 mg/dL — AB (ref 65–99)
GLUCOSE-CAPILLARY: 109 mg/dL — AB (ref 65–99)
GLUCOSE-CAPILLARY: 108 mg/dL — AB (ref 65–99)
GLUCOSE-CAPILLARY: 130 mg/dL — AB (ref 65–99)
GLUCOSE-CAPILLARY: 132 mg/dL — AB (ref 65–99)
Glucose-Capillary: 104 mg/dL — ABNORMAL HIGH (ref 65–99)
Glucose-Capillary: 109 mg/dL — ABNORMAL HIGH (ref 65–99)
Glucose-Capillary: 112 mg/dL — ABNORMAL HIGH (ref 65–99)
Glucose-Capillary: 128 mg/dL — ABNORMAL HIGH (ref 65–99)
Glucose-Capillary: 129 mg/dL — ABNORMAL HIGH (ref 65–99)
Glucose-Capillary: 134 mg/dL — ABNORMAL HIGH (ref 65–99)
Glucose-Capillary: 145 mg/dL — ABNORMAL HIGH (ref 65–99)
Glucose-Capillary: 86 mg/dL (ref 65–99)
Glucose-Capillary: 93 mg/dL (ref 65–99)

## 2015-09-20 LAB — MAGNESIUM
MAGNESIUM: 2 mg/dL (ref 1.7–2.4)
Magnesium: 2.1 mg/dL (ref 1.7–2.4)

## 2015-09-20 LAB — CREATININE, SERUM
CREATININE: 2.63 mg/dL — AB (ref 0.44–1.00)
GFR calc non Af Amer: 16 mL/min — ABNORMAL LOW (ref >60)
GFR, EST AFRICAN AMERICAN: 18 mL/min — AB (ref >60)

## 2015-09-20 LAB — POCT I-STAT, CHEM 8
BUN: 26 mg/dL — ABNORMAL HIGH (ref 6–20)
CREATININE: 2.6 mg/dL — AB (ref 0.44–1.00)
Calcium, Ion: 0.99 mmol/L — ABNORMAL LOW (ref 1.13–1.30)
Chloride: 102 mmol/L (ref 101–111)
Glucose, Bld: 118 mg/dL — ABNORMAL HIGH (ref 65–99)
HEMATOCRIT: 28 % — AB (ref 36.0–46.0)
HEMOGLOBIN: 9.5 g/dL — AB (ref 12.0–15.0)
POTASSIUM: 4.7 mmol/L (ref 3.5–5.1)
Sodium: 139 mmol/L (ref 135–145)
TCO2: 23 mmol/L (ref 0–100)

## 2015-09-20 MED ORDER — FUROSEMIDE 10 MG/ML IJ SOLN
40.0000 mg | Freq: Once | INTRAMUSCULAR | Status: AC
  Administered 2015-09-20: 40 mg via INTRAVENOUS
  Filled 2015-09-20: qty 4

## 2015-09-20 MED ORDER — ENOXAPARIN SODIUM 30 MG/0.3ML ~~LOC~~ SOLN
30.0000 mg | Freq: Every day | SUBCUTANEOUS | Status: DC
  Administered 2015-09-20 – 2015-10-02 (×13): 30 mg via SUBCUTANEOUS
  Filled 2015-09-20 (×13): qty 0.3

## 2015-09-20 MED ORDER — INSULIN DETEMIR 100 UNIT/ML ~~LOC~~ SOLN
10.0000 [IU] | Freq: Once | SUBCUTANEOUS | Status: AC
  Administered 2015-09-20: 10 [IU] via SUBCUTANEOUS
  Filled 2015-09-20 (×2): qty 0.1

## 2015-09-20 MED ORDER — AMLODIPINE BESYLATE 5 MG PO TABS
5.0000 mg | ORAL_TABLET | Freq: Every day | ORAL | Status: DC
  Administered 2015-09-20 – 2015-09-21 (×2): 5 mg via ORAL
  Filled 2015-09-20 (×2): qty 1

## 2015-09-20 MED ORDER — INSULIN DETEMIR 100 UNIT/ML ~~LOC~~ SOLN
10.0000 [IU] | Freq: Every day | SUBCUTANEOUS | Status: DC
Start: 2015-09-21 — End: 2015-09-22
  Administered 2015-09-21 – 2015-09-22 (×2): 10 [IU] via SUBCUTANEOUS
  Filled 2015-09-20 (×2): qty 0.1

## 2015-09-20 MED ORDER — INSULIN ASPART 100 UNIT/ML ~~LOC~~ SOLN
0.0000 [IU] | SUBCUTANEOUS | Status: DC
  Administered 2015-09-20: 2 [IU] via SUBCUTANEOUS
  Administered 2015-09-21: 4 [IU] via SUBCUTANEOUS
  Administered 2015-09-21: 8 [IU] via SUBCUTANEOUS
  Administered 2015-09-21: 12 [IU] via SUBCUTANEOUS
  Administered 2015-09-21: 2 [IU] via SUBCUTANEOUS
  Administered 2015-09-22: 8 [IU] via SUBCUTANEOUS
  Administered 2015-09-22: 2 [IU] via SUBCUTANEOUS
  Administered 2015-09-22: 4 [IU] via SUBCUTANEOUS
  Administered 2015-09-23 – 2015-09-24 (×5): 2 [IU] via SUBCUTANEOUS
  Administered 2015-09-24: 4 [IU] via SUBCUTANEOUS
  Administered 2015-09-24 – 2015-09-25 (×2): 2 [IU] via SUBCUTANEOUS
  Administered 2015-09-25: 8 [IU] via SUBCUTANEOUS
  Administered 2015-09-25: 4 [IU] via SUBCUTANEOUS
  Administered 2015-09-25: 2 [IU] via SUBCUTANEOUS
  Administered 2015-09-25: 8 [IU] via SUBCUTANEOUS
  Administered 2015-09-26: 4 [IU] via SUBCUTANEOUS
  Administered 2015-09-26 (×2): 12 [IU] via SUBCUTANEOUS
  Administered 2015-09-26: 8 [IU] via SUBCUTANEOUS
  Administered 2015-09-26: 16 [IU] via SUBCUTANEOUS
  Administered 2015-09-26: 2 [IU] via SUBCUTANEOUS
  Administered 2015-09-27: 4 [IU] via SUBCUTANEOUS
  Administered 2015-09-27 (×2): 8 [IU] via SUBCUTANEOUS
  Administered 2015-09-27: 2 [IU] via SUBCUTANEOUS
  Administered 2015-09-27: 12 [IU] via SUBCUTANEOUS
  Administered 2015-09-27: 2 [IU] via SUBCUTANEOUS
  Administered 2015-09-27: 4 [IU] via SUBCUTANEOUS

## 2015-09-20 NOTE — Op Note (Signed)
NAMEVERLEEN, STIGALL NO.:  [id number]  MEDICAL RECORD NO.:  [id number]  LOCATION:  2S06C                        FACILITY:  [facility]  PHYSICIAN:  Lanelle Bal, MD    DATE OF BIRTH:  09/21/1930  DATE OF PROCEDURE:  09/20/2015 DATE OF DISCHARGE:                              OPERATIVE REPORT   PREOPERATIVE DIAGNOSIS:  Critical three-vessel coronary artery disease with unstable angina.  POSTOPERATIVE DIAGNOSIS: Critical three-vessel coronary artery disease with unstable angina.  SURGICAL PROCEDURE:  Coronary artery bypass grafting x4, urgent coronary artery bypass grafting x4 with the left internal mammary to the left anterior descending coronary artery, reverse saphenous vein graft to the intermediate coronary artery, reverse saphenous vein graft to the obtuse marginal coronary artery, reverse saphenous vein graft to the distal right coronary artery with left greater saphenous thigh and calf endo- vein harvesting, and right thigh greater saphenous endo-vein harvesting.  SURGEON:  Lanelle Bal, M.D.  FIRST ASSISTANT:  Carmina Miller, PA.  BRIEF HISTORY:  The patient is an 82 year old female, who while walking in [employer] began having severe shortness of breath and chest discomfort and was seen in the Urgent Care Center in [facility].  She was transferred to [facility] for further evaluation on March 10.  On March 13, cardiac catheterization was performed.  On March 14, Surgical consultation was obtained.  The patient was found to have severe critical three-vessel coronary artery disease with 95% stenosis of the LAD, 90%-95% stenosis of the obtuse marginal vessel diffuse disease throughout the right coronary artery.  Overall, ventricular function was preserved.  The patient's overall medical status was poor, but because of her critical anatomy and symptoms at presentation, coronary artery bypass grafting was felt to be the best option for the patient.  She  has known renal stage IV renal insufficiency and is followed by Nephrology in [facility].  She was seen in consultation preoperatively by Nephrology and because of her anatomy, we decided to proceed urgently the following morning.  The risks and options were discussed with the patient and her family in detail.  She also has known cerebrovascular disease with a bruit on the right.  Doppler studies revealed that there was no significant stenosis in the carotid arteries.  DESCRIPTION OF PROCEDURE:  With Swan-Ganz and arterial line monitors in place, the patient underwent general endotracheal anesthesia without any incidence.  Skin of the chest and legs was prepped with Betadine and draped in usual sterile manner.  A TEE probe was placed by Dr. Barbie Banner with some difficulty, but did reveal preserved LV function without significant mitral regurgitation or aortic regurgitation.  Appropriate time-out was performed and we proceeded with harvesting vein endoscopically from the left thigh and calf.  Since the catheter was smaller, additional vein was harvested from the right thigh.  Median sternotomy was performed.  Left internal mammary artery was dissected down as pedicle graft distal artery was divided, had good free flow. Pericardium was opened.  Overall, ventricular function appeared preserved.  The patient was systemically heparinized.  Ascending aorta was cannulated.  The right atrium was cannulated and aortic root vent cardioplegia needle introduced into the ascending aorta.  The patient was then placed on cardiopulmonary bypass 2.4 L/min/m2.  Sites of anastomosis were selected and dissected out of the epicardium.  The patient's body temperature was cooled to 32 degrees.  Aortic crossclamp was applied and 500 mL cold blood potassium cardioplegia was administered antegrade.  We turned our attention first to the distal right coronary artery.  The right coronary artery was diffusely diseased  and significantly calcified.  The posterior descending, posterolateral branches of the right coronary artery were too small to bypass.  The distal right coronary artery was opened, the vessel was of poor quality and thickened, but did admit a 1.5 mm probe distally.  Using a running 7-0 Prolene, distal anastomosis was performed.  The heart was then elevated and the obtuse marginal vessel which was partially intramyocardial was opened and admitted a 1.5 mm probe.  Using a running 7-0 Prolene, distal anastomosis was performed.  Higher on the lateral wall was intermediate coronary artery which was slightly smaller.  Vessel was opened, admitted a 1 mm probe distally.  Using a running 7-0 Prolene, distal anastomosis was performed.  Additional cold blood cardioplegia was administered down the vein graft. The left anterior descending coronary artery was intramyocardial, but was dissected out in the distal third of the vessel.  Even distally the vessel was of reasonable sized, admitted a 1 mm probe distally.  Using a running 8-0 Prolene, left internal mammary artery was anastomosed to left anterior descending coronary artery.  With crossclamp still in place, three punch aortotomies were performed.  Each of the 3 vein grafts anastomosed to the ascending aorta.  Air was evacuated from the grafts and the aortic cross-clamp was removed with a total crossclamp time of 102 minutes, the patient spontaneously converted to.  The bulldog had been removed from the mammary artery with prompt rise in myocardial temperature prior to removal of the crossclamp.  Atrial and ventricular pacing wires were applied.  The sites of anastomosis were inspected free of bleeding.  The patient was then ventilated and weaned from cardiopulmonary bypass without difficulty.  She remained hemodynamically stable, was decannulated in usual fashion.  Protamine sulfate was administered with operative field hemostatic.  Atrial  and ventricular pacing wires had been applied and she was being atrially paced.  She had been maintained on low-dose dopamine because of her renal function. Left pleural tube and a Blake mediastinal drain were left in place.  Sternum was closed with #6 stainless steel wire.  Fascia was closed with interrupted 0 Vicryl, running 3-0 Vicryl in subcutaneous tissue, 3-0 subcuticular stitch in skin edges.  Dry dressings were applied.  Sponge and needle count was reported as correct at the completion of the procedure.  The patient tolerated the procedure without obvious complication and was transferred to the Surgical Intensive care Unit.  The patient tolerated procedure without obvious complication.  She did require packed cells because of low hematocrit while on bypass, and prior to surgery. Total pump time was 136 minutes.     Lanelle Bal, MD     EG/MEDQ  D:  09/20/2015  T:  09/20/2015  Job:  [id number]

## 2015-09-20 NOTE — Progress Notes (Signed)
Patient ID: Monica [name], female   DOB: 1931-02-01, 82 y.o.   MRN: [id number]   SICU Evening Rounds:   Hemodynamically stable  Dopamine 3   Urine output  15 cc per hour   BMP Latest Ref Rng 09/20/2015 09/20/2015 09/20/2015  Glucose 65 - 99 mg/dL - 118(H) 118(H)  BUN 6 - 20 mg/dL - 26(H) 20  Creatinine 0.44 - 1.00 mg/dL 2.63(H) 2.60(H) 1.98(H)  Sodium 135 - 145 mmol/L - 139 140  Potassium 3.5 - 5.1 mmol/L - 4.7 4.3  Chloride 101 - 111 mmol/L - 102 107  CO2 22 - 32 mmol/L - - 22  Calcium 8.9 - 10.3 mg/dL - - 7.1(L)   CBC Latest Ref Rng 09/20/2015 09/20/2015 09/20/2015  WBC 4.0 - 10.5 K/uL 15.1(H) - 11.1(H)  Hemoglobin 12.0 - 15.0 g/dL 8.3(L) 9.5(L) 8.6(L)  Hematocrit 36.0 - 46.0 % 26.4(L) 28.0(L) 26.6(L)  Platelets 150 - 400 K/uL 110(L) - 112(L)    A/P:  Stable postop course. Continue current plans. Monitor urine output. Stage IV CKD.

## 2015-09-20 NOTE — Procedures (Signed)
Extubation Procedure Note  Patient Details:   Name: Monica [name] DOB: May 27, 1931 MRN: [id number]   Airway Documentation:     Evaluation  O2 sats: stable throughout Complications: No apparent complications Patient did tolerate procedure well. Bilateral Breath Sounds: Clear, Diminished Suctioning: Oral, Airway Yes   Passed rapid wean protocol.  ABG within protocol limits.  Leak test positive.  Extubated to 5L [place] without complication.  Patient able to cough and speak.  Instructed on cough and deep breathing.  [name] 09/20/2015, 5:46 AM

## 2015-09-20 NOTE — Progress Notes (Signed)
Attempted to re-initiate rapid wean per protocol, however, no patient effort noted.  Patient would open eyes to verbal stimulation, and would take a breath if requested, but would fall back asleep if not stimulated.  No sedation per RN.  Placed back on resting settings.  RT will continue to monitor.

## 2015-09-20 NOTE — Progress Notes (Addendum)
Patient ID: Monica [name], female   DOB: 12-Jun-1931, 82 y.o.   MRN: [id number] TCTS DAILY ICU PROGRESS NOTE                   [address]            ,[place] [address]          [phone]   1 Day Post-Op Procedure(s) (LRB): CORONARY ARTERY BYPASS GRAFTING (CABG) TIMES FOUR USING BILATARAL SAPHENOUS VEIN GRAFTS AND LEFT INTERNAL MAMMARY ARTERY (N/A) TRANSESOPHAGEAL ECHOCARDIOGRAM (TEE) (N/A)  Total Length of Stay:  LOS: 1 day   Subjective: ex tubed 5: am , awake and neuro intact   Objective: Vital signs in last 24 hours: Temp:  [97.3 F (36.3 C)-100.2 F (37.9 C)] 99.3 F (37.4 C) (03/16 0800) Pulse Rate:  [73-110] 110 (03/16 0800) Cardiac Rhythm:  [-] Sinus tachycardia (03/16 0800) Resp:  [11-20] 18 (03/16 0800) BP: (88-152)/(49-87) 133/87 mmHg (03/16 0800) SpO2:  [99 %-100 %] 100 % (03/16 0800) Arterial Line BP: (97-171)/(46-66) 139/64 mmHg (03/16 0800) FiO2 (%):  [40 %-50 %] 40 % (03/16 0516) Weight:  [187 lb 9.8 oz (85.1 kg)] 187 lb 9.8 oz (85.1 kg) (03/16 0500)  Filed Weights   09/17/15 0449 09/19/15 0526 09/20/15 0500  Weight: 179 lb 12.8 oz (81.557 kg) 183 lb 3.2 oz (83.099 kg) 187 lb 9.8 oz (85.1 kg)    Weight change: 4 lb 6.6 oz (2.001 kg)   Hemodynamic parameters for last 24 hours: PAP: (22-37)/(10-26) 33/23 mmHg CO:  [2.7 L/min-5.7 L/min] 5.7 L/min CI:  [1.4 L/min/m2-3.1 L/min/m2] 3.1 L/min/m2  Intake/Output from previous day: 03/15 0701 - 03/16 0700 In: 5142.4 [I.V.:3534.4; Blood:508; IV [id number] Out: [id number] [Urine:1335; Blood:2300; Chest Tube:240]  Intake/Output this shift: Total I/O In: 86.8 [I.V.:86.8] Out: 60 [Urine:60]  Current Meds: Scheduled Meds: . acetaminophen  1,000 mg Oral 4 times per day   Or  . acetaminophen (TYLENOL) oral liquid 160 mg/5 mL  1,000 mg Per Tube 4 times per day  . antiseptic oral rinse  7 mL Mouth Rinse QID  . aspirin EC  325 mg Oral Daily   Or  . aspirin  324 mg Per Tube Daily  . atorvastatin   80 mg Oral q1800  . bisacodyl  10 mg Oral Daily   Or  . bisacodyl  10 mg Rectal Daily  . cefUROXime (ZINACEF)  IV  1.5 g Intravenous Q12H  . chlorhexidine gluconate  15 mL Mouth Rinse BID  . docusate sodium  200 mg Oral Daily  . insulin regular  0-10 Units Intravenous TID WC  . levalbuterol  0.63 mg Nebulization TID  . magnesium sulfate  4 g Intravenous Once  . metoCLOPramide (REGLAN) injection  10 mg Intravenous 4 times per day  . metoprolol tartrate  12.5 mg Oral BID   Or  . metoprolol tartrate  12.5 mg Per Tube BID  . [START ON 09/21/2015] pantoprazole  40 mg Oral Daily  . sodium chloride flush  3 mL Intravenous Q12H   Continuous Infusions: . sodium chloride 20 mL/hr at 09/20/15 0800  . sodium chloride 250 mL (09/20/15 0606)  . sodium chloride 10 mL/hr at 09/19/15 1800  . dexmedetomidine Stopped (09/19/15 1800)  . DOPamine 5 mcg/kg/min (09/20/15 0800)  . insulin (NOVOLIN-R) infusion 1 Units/hr (09/20/15 0700)  . lactated ringers Stopped (09/19/15 1612)  . lactated ringers 20 mL/hr at 09/20/15 0800  . nitroGLYCERIN 60 mcg/min (09/20/15 0800)  . phenylephrine (NEO-SYNEPHRINE) Adult  infusion Stopped (09/20/15 0600)   PRN Meds:.sodium chloride, albumin human, lactated ringers, metoprolol, midazolam, morphine injection, ondansetron (ZOFRAN) IV, oxyCODONE, sodium chloride flush, traMADol  General appearance: alert, cooperative and no distress Neurologic: intact Heart: regular rate and rhythm, S1, S2 normal, no murmur, click, rub or gallop Lungs: diminished breath sounds bibasilar Abdomen: soft, non-tender; bowel sounds normal; no masses,  no organomegaly Extremities: extremities normal, atraumatic, no cyanosis or edema and Homans sign is negative, no sign of DVT Wound: sternum intact  Lab Results: CBC: Recent Labs  09/19/15 2130 09/20/15 0400  WBC 11.4* 11.1*  HGB 9.0* 8.6*  HCT 27.7* 26.6*  PLT 107* 112*   BMET:  Recent Labs  09/19/15 0250  09/19/15 2129  09/19/15 2130 09/20/15 0400  NA 141  < > 140  --  140  K 3.8  < > 4.2  --  4.3  CL 104  < > 104  --  107  CO2 26  --   --   --  22  GLUCOSE 100*  < > 137*  --  118*  BUN 18  < > 18  --  20  CREATININE 1.62*  < > 1.50* 1.61* 1.98*  CALCIUM 8.3*  --   --   --  7.1*  < > = values in this interval not displayed.  PT/INR:  Recent Labs  09/19/15 1540  LABPROT 18.4*  INR 1.53*   Radiology: Dg Chest Port 1 View  09/20/2015  CLINICAL DATA:  Post CABG, hypertension, type II diabetes mellitus, asthma EXAM: PORTABLE CHEST 1 VIEW COMPARISON:  Portable exam 0628 hours compared to 09/19/2015 FINDINGS: Swan-Ganz catheter tip projects over RIGHT pulmonary artery. LEFT thoracostomy tube stable. Enlargement of cardiac silhouette post CABG. Atherosclerotic calcification aorta. Bibasilar atelectasis. Significant portions of the upper lobes and lung apices obscured by patient's head. No gross pleural effusion. Unable to assess for pneumothorax due to obscuration of the upper lobes. IMPRESSION: Bibasilar atelectasis. Enlargement of cardiac silhouette post CABG. Electronically Signed   By: Lavonia Dana M.D.   On: 09/20/2015 08:06   Dg Chest Port 1 View  09/19/2015  CLINICAL DATA:  Coronary artery disease.  Status post CABG. EXAM: PORTABLE CHEST 1 VIEW COMPARISON:  09/14/2015 FINDINGS: Endotracheal tube and NG tube and chest tubes appear in good position. Swan-Ganz catheter tip is in the right main pulmonary artery. The lungs are clear. Very tiny left apical pneumothorax. Heart size and vascularity are normal. Aortic atherosclerosis. IMPRESSION: Very tiny left apical pneumothorax. Tubes and lines appear in good position. Electronically Signed   By: Lorriane Shire M.D.   On: 09/19/2015 16:39     Assessment/Plan: S/P Procedure(s) (LRB): CORONARY ARTERY BYPASS GRAFTING (CABG) TIMES FOUR USING BILATARAL SAPHENOUS VEIN GRAFTS AND LEFT INTERNAL MAMMARY ARTERY (N/A) TRANSESOPHAGEAL ECHOCARDIOGRAM (TEE)  (N/A) Mobilize Diuresis Diabetes control Continue foley due to acute urinary retention, strict I&O, patient in ICU and urinary output monitoring See progression orders Stage IV ckd, renal following Continue low dose dopamine , k+ ok Resume Norvasc, 5 mg ( home dose 10 mg)  [name] 09/20/2015 8:27 AM

## 2015-09-20 NOTE — Progress Notes (Signed)
Admit: 09/14/2015 LOS: 1  61F with CKD3, stable, s/p 3V CABG 3/15  Subjective:  Extubated overnight, awake and conversant this AM Remains on dopamine and NTG gtt Adequate UOP   03/15 0701 - 03/16 0700 In: 5142.4 [I.V.:3534.4; Blood:508; IV [id number] Out: [id number] [Urine:1335; Blood:2300; Chest Tube:240]  Filed Weights   09/17/15 0449 09/19/15 0526 09/20/15 0500  Weight: 81.557 kg (179 lb 12.8 oz) 83.099 kg (183 lb 3.2 oz) 85.1 kg (187 lb 9.8 oz)    Scheduled Meds: . acetaminophen  1,000 mg Oral 4 times per day   Or  . acetaminophen (TYLENOL) oral liquid 160 mg/5 mL  1,000 mg Per Tube 4 times per day  . antiseptic oral rinse  7 mL Mouth Rinse QID  . aspirin EC  325 mg Oral Daily   Or  . aspirin  324 mg Per Tube Daily  . atorvastatin  80 mg Oral q1800  . bisacodyl  10 mg Oral Daily   Or  . bisacodyl  10 mg Rectal Daily  . cefUROXime (ZINACEF)  IV  1.5 g Intravenous Q12H  . chlorhexidine gluconate  15 mL Mouth Rinse BID  . docusate sodium  200 mg Oral Daily  . insulin regular  0-10 Units Intravenous TID WC  . levalbuterol  0.63 mg Nebulization TID  . magnesium sulfate  4 g Intravenous Once  . metoCLOPramide (REGLAN) injection  10 mg Intravenous 4 times per day  . metoprolol tartrate  12.5 mg Oral BID   Or  . metoprolol tartrate  12.5 mg Per Tube BID  . [START ON 09/21/2015] pantoprazole  40 mg Oral Daily  . sodium chloride flush  3 mL Intravenous Q12H   Continuous Infusions: . sodium chloride 20 mL/hr at 09/20/15 0800  . sodium chloride 250 mL (09/20/15 0606)  . sodium chloride 10 mL/hr at 09/19/15 1800  . dexmedetomidine Stopped (09/19/15 1800)  . DOPamine 5 mcg/kg/min (09/20/15 0800)  . insulin (NOVOLIN-R) infusion 1 Units/hr (09/20/15 0700)  . lactated ringers Stopped (09/19/15 1612)  . lactated ringers 20 mL/hr at 09/20/15 0800  . nitroGLYCERIN 60 mcg/min (09/20/15 0800)  . phenylephrine (NEO-SYNEPHRINE) Adult infusion Stopped (09/20/15 0600)   PRN  Meds:.sodium chloride, albumin human, lactated ringers, metoprolol, midazolam, morphine injection, ondansetron (ZOFRAN) IV, oxyCODONE, sodium chloride flush, traMADol  Current Labs: reviewed   Physical Exam:  Blood pressure 133/87, pulse 110, temperature 99.3 F (37.4 C), temperature source Core (Comment), resp. rate 18, height 5\' 2"  (1.575 m), weight 85.1 kg (187 lb 9.8 oz), SpO2 100 %. GEN: awake, alert, lying still ENT: NCAT CV: RRR PULM: CTAB, coarse bs ant ABD: s/nt/nd SKIN: no rashes/lesions EXT:No LEE  A 1. CKD3 presumed 2/2 DM and HTN:  2. CAD, 3V disease, s/p 4V CABG 3/15  3. DM2 4. HTN 5. HLD  P 1. Stable labs, good UOP, will cont to follow;  SCr up a tad, not surprising Daily weights, Daily Renal Panel, Strict I/Os, Avoid nephrotoxins (NSAIDs, judicious IV Contrast)    Pearson Grippe MD 09/20/2015, 8:27 AM   Recent Labs Lab 09/18/15 0232 09/19/15 0250  09/19/15 1417 09/19/15 1542 09/19/15 2129 09/19/15 2130 09/20/15 0400  NA 139 141  < > 139 139 140  --  140  K 4.1 3.8  < > 4.2 3.9 4.2  --  4.3  CL 106 104  < > 100*  --  104  --  107  CO2 24 26  --   --   --   --   --  22  GLUCOSE 196* 100*  < > 154* 150* 137*  --  118*  BUN 20 18  < > 17  --  18  --  20  CREATININE 1.48* 1.62*  < > 1.20*  --  1.50* 1.61* 1.98*  CALCIUM 8.1* 8.3*  --   --   --   --   --  7.1*  < > = values in this interval not displayed.  Recent Labs Lab 09/19/15 1540  09/19/15 2129 09/19/15 2130 09/20/15 0400  WBC 15.1*  --   --  11.4* 11.1*  HGB 10.6*  < > 9.2* 9.0* 8.6*  HCT 32.1*  < > 27.0* 27.7* 26.6*  MCV 86.3  --   --  87.7 88.1  PLT 109*  --   --  107* 112*  < > = values in this interval not displayed.

## 2015-09-20 NOTE — Progress Notes (Signed)
Patient tolerating PSV well.  Able to lift head from pillow and follow commands.  NIF: -22 VC: 553ml (88ml/kg = 500)

## 2015-09-21 ENCOUNTER — Inpatient Hospital Stay (HOSPITAL_COMMUNITY): Payer: Medicare HMO

## 2015-09-21 LAB — GLUCOSE, CAPILLARY
GLUCOSE-CAPILLARY: 119 mg/dL — AB (ref 65–99)
Glucose-Capillary: 105 mg/dL — ABNORMAL HIGH (ref 65–99)
Glucose-Capillary: 111 mg/dL — ABNORMAL HIGH (ref 65–99)
Glucose-Capillary: 167 mg/dL — ABNORMAL HIGH (ref 65–99)
Glucose-Capillary: 248 mg/dL — ABNORMAL HIGH (ref 65–99)
Glucose-Capillary: 209 mg/dL — ABNORMAL HIGH (ref 65–99)

## 2015-09-21 LAB — BASIC METABOLIC PANEL
Anion gap: 10 (ref 5–15)
Anion gap: 13 (ref 5–15)
BUN: 30 mg/dL — ABNORMAL HIGH (ref 6–20)
BUN: 37 mg/dL — AB (ref 6–20)
CHLORIDE: 102 mmol/L (ref 101–111)
CO2: 23 mmol/L (ref 22–32)
CO2: 22 mmol/L (ref 22–32)
CREATININE: 4.04 mg/dL — AB (ref 0.44–1.00)
Calcium: 7 mg/dL — ABNORMAL LOW (ref 8.9–10.3)
Calcium: 6.8 mg/dL — ABNORMAL LOW (ref 8.9–10.3)
Chloride: 106 mmol/L (ref 101–111)
Creatinine, Ser: 3.34 mg/dL — ABNORMAL HIGH (ref 0.44–1.00)
GFR calc Af Amer: 14 mL/min — ABNORMAL LOW (ref >60)
GFR calc Af Amer: 11 mL/min — ABNORMAL LOW (ref >60)
GFR calc non Af Amer: 12 mL/min — ABNORMAL LOW (ref >60)
GFR calc non Af Amer: 9 mL/min — ABNORMAL LOW (ref >60)
GLUCOSE: 314 mg/dL — AB (ref 65–99)
Glucose, Bld: 132 mg/dL — ABNORMAL HIGH (ref 65–99)
Potassium: 4.8 mmol/L (ref 3.5–5.1)
Potassium: 5.2 mmol/L — ABNORMAL HIGH (ref 3.5–5.1)
SODIUM: 137 mmol/L (ref 135–145)
Sodium: 139 mmol/L (ref 135–145)

## 2015-09-21 LAB — POCT I-STAT, CHEM 8
BUN: 18 mg/dL (ref 6–20)
CALCIUM ION: 1.04 mmol/L — AB (ref 1.13–1.30)
CHLORIDE: 103 mmol/L (ref 101–111)
Creatinine, Ser: 1.2 mg/dL — ABNORMAL HIGH (ref 0.44–1.00)
GLUCOSE: 116 mg/dL — AB (ref 65–99)
HCT: 23 % — ABNORMAL LOW (ref 36.0–46.0)
Hemoglobin: 7.8 g/dL — ABNORMAL LOW (ref 12.0–15.0)
Potassium: 3.7 mmol/L (ref 3.5–5.1)
Sodium: 140 mmol/L (ref 135–145)
TCO2: 30 mmol/L (ref 0–100)

## 2015-09-21 LAB — CBC
HCT: 23.8 % — ABNORMAL LOW (ref 36.0–46.0)
HCT: 26.6 % — ABNORMAL LOW (ref 36.0–46.0)
Hemoglobin: 7.6 g/dL — ABNORMAL LOW (ref 12.0–15.0)
Hemoglobin: 8.6 g/dL — ABNORMAL LOW (ref 12.0–15.0)
MCH: 28.3 pg (ref 26.0–34.0)
MCH: 28.3 pg (ref 26.0–34.0)
MCHC: 31.9 g/dL (ref 30.0–36.0)
MCHC: 32.3 g/dL (ref 30.0–36.0)
MCV: 88.5 fL (ref 78.0–100.0)
MCV: 87.5 fL (ref 78.0–100.0)
Platelets: 125 10*3/uL — ABNORMAL LOW (ref 150–400)
Platelets: 130 10*3/uL — ABNORMAL LOW (ref 150–400)
RBC: 2.69 MIL/uL — ABNORMAL LOW (ref 3.87–5.11)
RBC: 3.04 MIL/uL — ABNORMAL LOW (ref 3.87–5.11)
RDW: 14.8 % (ref 11.5–15.5)
RDW: 14.8 % (ref 11.5–15.5)
WBC: 14.1 10*3/uL — ABNORMAL HIGH (ref 4.0–10.5)
WBC: 14.6 10*3/uL — ABNORMAL HIGH (ref 4.0–10.5)

## 2015-09-21 LAB — POCT I-STAT 3, ART BLOOD GAS (G3+)
Acid-base deficit: 6 mmol/L — ABNORMAL HIGH (ref 0.0–2.0)
BICARBONATE: 20.4 meq/L (ref 20.0–24.0)
O2 Saturation: 84 %
PCO2 ART: 44.1 mmHg (ref 35.0–45.0)
PH ART: 7.273 — AB (ref 7.350–7.450)
TCO2: 22 mmol/L (ref 0–100)
pO2, Arterial: 55 mmHg — ABNORMAL LOW (ref 80.0–100.0)

## 2015-09-21 LAB — PREPARE RBC (CROSSMATCH)

## 2015-09-21 MED ORDER — CETYLPYRIDINIUM CHLORIDE 0.05 % MT LIQD
7.0000 mL | Freq: Two times a day (BID) | OROMUCOSAL | Status: DC
  Administered 2015-09-22 – 2015-09-27 (×11): 7 mL via OROMUCOSAL

## 2015-09-21 MED ORDER — SODIUM BICARBONATE 8.4 % IV SOLN
50.0000 meq | Freq: Once | INTRAVENOUS | Status: AC
  Administered 2015-09-21: 50 meq via INTRAVENOUS

## 2015-09-21 MED ORDER — FUROSEMIDE 10 MG/ML IJ SOLN
80.0000 mg | Freq: Once | INTRAMUSCULAR | Status: AC
  Administered 2015-09-21: 80 mg via INTRAVENOUS
  Filled 2015-09-21: qty 8

## 2015-09-21 NOTE — Progress Notes (Signed)
k 5.2 noted. Monica [name] C

## 2015-09-21 NOTE — Progress Notes (Signed)
TCTS BRIEF SICU PROGRESS NOTE  2 Days Post-Op  S/P Procedure(s) (LRB): CORONARY ARTERY BYPASS GRAFTING (CABG) TIMES FOUR USING BILATARAL SAPHENOUS VEIN GRAFTS AND LEFT INTERNAL MAMMARY ARTERY (N/A) TRANSESOPHAGEAL ECHOCARDIOGRAM (TEE) (N/A)   Sleepy and lethargic all day, but wakes up and follows commands Very weak NSR w/ stable BP O2 sats 100% on 3 L/min UOP 15-20 mL/hr, minimal response to IV lasix and blood Creatinine up to 4.0 Potassium 5.2  Plan: Continue current plan  Rexene Alberts, MD 09/21/2015 6:30 PM

## 2015-09-21 NOTE — Progress Notes (Addendum)
Patient ID: Monica [name], female   DOB: Jun 09, 1931, 82 y.o.   MRN: [id number] TCTS DAILY ICU PROGRESS NOTE                   [address]            ,[place] [address]          [phone]   2 Days Post-Op Procedure(s) (LRB): CORONARY ARTERY BYPASS GRAFTING (CABG) TIMES FOUR USING BILATARAL SAPHENOUS VEIN GRAFTS AND LEFT INTERNAL MAMMARY ARTERY (N/A) TRANSESOPHAGEAL ECHOCARDIOGRAM (TEE) (N/A)  Total Length of Stay:  LOS: 2 days   Subjective: Sedated this am from iv morphine given during the night  Objective: Vital signs in last 24 hours: Temp:  [98 F (36.7 C)-99 F (37.2 C)] 98 F (36.7 C) (03/17 0753) Pulse Rate:  [82-106] 94 (03/17 0700) Cardiac Rhythm:  [-] Normal sinus rhythm (03/17 0400) Resp:  [12-29] 16 (03/17 0700) BP: (82-150)/(45-116) 111/94 mmHg (03/17 0700) SpO2:  [93 %-100 %] 100 % (03/17 0700) Arterial Line BP: (101-160)/(38-61) 143/49 mmHg (03/17 0700) Weight:  [190 lb 4.1 oz (86.3 kg)] 190 lb 4.1 oz (86.3 kg) (03/17 0500)  Filed Weights   09/19/15 0526 09/20/15 0500 09/21/15 0500  Weight: 183 lb 3.2 oz (83.099 kg) 187 lb 9.8 oz (85.1 kg) 190 lb 4.1 oz (86.3 kg)    Weight change: 2 lb 10.3 oz (1.2 kg)   Hemodynamic parameters for last 24 hours:    Intake/Output from previous day: 03/16 0701 - 03/17 0700 In: 580.5 [I.V.:480.5; IV Piggyback:100] Out: 475 [Urine:475]  Intake/Output this shift:    Current Meds: Scheduled Meds: . acetaminophen  1,000 mg Oral 4 times per day   Or  . acetaminophen (TYLENOL) oral liquid 160 mg/5 mL  1,000 mg Per Tube 4 times per day  . amLODipine  5 mg Oral Daily  . antiseptic oral rinse  7 mL Mouth Rinse QID  . aspirin EC  325 mg Oral Daily   Or  . aspirin  324 mg Per Tube Daily  . atorvastatin  80 mg Oral q1800  . bisacodyl  10 mg Oral Daily   Or  . bisacodyl  10 mg Rectal Daily  . cefUROXime (ZINACEF)  IV  1.5 g Intravenous Q12H  . chlorhexidine gluconate  15 mL Mouth Rinse BID  . docusate  sodium  200 mg Oral Daily  . enoxaparin (LOVENOX) injection  30 mg Subcutaneous QHS  . insulin aspart  0-24 Units Subcutaneous 6 times per day  . insulin detemir  10 Units Subcutaneous Daily  . levalbuterol  0.63 mg Nebulization TID  . magnesium sulfate  4 g Intravenous Once  . metoprolol tartrate  12.5 mg Oral BID   Or  . metoprolol tartrate  12.5 mg Per Tube BID  . pantoprazole  40 mg Oral Daily  . sodium chloride flush  3 mL Intravenous Q12H   Continuous Infusions: . sodium chloride Stopped (09/20/15 1032)  . sodium chloride 250 mL (09/20/15 0606)  . sodium chloride Stopped (09/20/15 1032)  . DOPamine 3 mcg/kg/min (09/20/15 2342)  . lactated ringers Stopped (09/19/15 1612)  . lactated ringers 10 mL/hr at 09/20/15 1800  . nitroGLYCERIN Stopped (09/20/15 1030)  . phenylephrine (NEO-SYNEPHRINE) Adult infusion Stopped (09/20/15 0600)   PRN Meds:.sodium chloride, lactated ringers, metoprolol, morphine injection, ondansetron (ZOFRAN) IV, oxyCODONE, sodium chloride flush, traMADol  General appearance: cooperative and slowed mentation Neurologic: intact Heart: regular rate and rhythm, S1, S2 normal, no murmur, click,  rub or gallop Lungs: diminished breath sounds bibasilar Abdomen: soft, non-tender; bowel sounds normal; no masses,  no organomegaly Extremities: extremities normal, atraumatic, no cyanosis or edema and Homans sign is negative, no sign of DVT Wound: sternum intact  Lab Results: CBC: Recent Labs  09/20/15 1630 09/21/15 0422  WBC 15.1* 14.1*  HGB 8.3* 7.6*  HCT 26.4* 23.8*  PLT 110* 125*   BMET:  Recent Labs  09/20/15 0400 09/20/15 1625 09/20/15 1630 09/21/15 0422  NA 140 139  --  139  K 4.3 4.7  --  4.8  CL 107 102  --  106  CO2 22  --   --  23  GLUCOSE 118* 118*  --  132*  BUN 20 26*  --  30*  CREATININE 1.98* 2.60* 2.63* 3.34*  CALCIUM 7.1*  --   --  7.0*    PT/INR:  Recent Labs  09/19/15 1540  LABPROT 18.4*  INR 1.53*   Radiology: Dg Chest  Port 1 View  09/21/2015  CLINICAL DATA:  Hypertension.  Diabetes.  Post CABG. EXAM: PORTABLE CHEST 1 VIEW COMPARISON:  09/20/2015; 09/19/2015; 09/14/2015 FINDINGS: Grossly unchanged enlarged cardiac silhouette and mediastinal contours given patient rotation. Interval removal of right jugular approach PA catheter with remaining vascular sheath tip overlying the mid SVC. No pneumothorax. Mild pulmonary venous congestion without frank evidence of edema. There is a minimal amount of pleural parenchymal thickening about the right minor fissure. Trace bilateral effusions are not excluded. Bibasilar heterogeneous/consolidative opacities are unchanged. No new focal airspace opacities. Unchanged bones. IMPRESSION: 1. Remaining support apparatus as above.  No pneumothorax. 2. Pulmonary is congestion without frank evidence of edema. 3. Unchanged bibasilar opacities, left greater than right, likely atelectasis. Electronically Signed   By: Sandi Mariscal M.D.   On: 09/21/2015 07:47   Acute Kidney Injury (any one)  Increase in SCr by > 0.3 within 48 hours  Increase SCr to > 1.5 times baseline  Urine volume < 0.5 ml/kg/h for 6 hrs  ?Stage 1 - Increase in serum creatinine to 1.5 to 1.9 times baseline, or increase in serum creatinine by ?0.3 mg/dL (?26.5 micromol/L), or reduction in urine output to <0.5 mL/kg per hour for 6 to 12 hours.  ?Stage 2 - Increase in serum creatinine to 2.0 to 2.9 times baseline, or reduction in urine output to <0.5 mL/kg per hour for ?12 hours.  ?Stage 3 - Increase in serum creatinine to 3.0 times baseline, or increase in serum creatinine to ?4.0 mg/dL (?353.6 micromol/L), or reduction in urine output to <0.3 mL/kg per hour for ?24 hours, or anuria for ?12 hours, or the initiation of renal replacement therapy, or, in patients <18 years, decrease in eGFR to <35 mL   Lab Results  Component Value Date   CREATININE 3.34* 09/21/2015   Estimated Creatinine Clearance: 13 mL/min (by C-G formula  based on Cr of 3.34).    Assessment/Plan: S/P Procedure(s) (LRB): CORONARY ARTERY BYPASS GRAFTING (CABG) TIMES FOUR USING BILATARAL SAPHENOUS VEIN GRAFTS AND LEFT INTERNAL MAMMARY ARTERY (N/A) TRANSESOPHAGEAL ECHOCARDIOGRAM (TEE) (N/A) Mobilize Diuresis Diabetes control Acute on severe chronic kidney disease  Prop anemia and Expected Acute  Blood - loss Anemia with renal disease will transfuse hgb up   Give one unit prbc  Pain meds already reduced for renal function, d/c iv morphine   [name] 09/21/2015 8:06 AM

## 2015-09-21 NOTE — Care Management Important Message (Signed)
Important Message  Patient Details  Name: Monica [name] MRN: [id number] Date of Birth: 06/10/1931   Medicare Important Message Given:  Yes    Sabas Frett P Jeymi Hepp 09/21/2015, 2:22 PM

## 2015-09-21 NOTE — Progress Notes (Signed)
Dr. Servando Snare made aware of ABG results; new orders given; will cont. To monitor.  Ruben Reason

## 2015-09-21 NOTE — Progress Notes (Signed)
Admit: 09/14/2015 LOS: 2  51F with CKD3, stable, s/p 3V CABG 3/15  Subjective:  Has developed AoCKD3, UOP falling off in past 24h K and HCO3 ok Foley in place BP adequate still on low dose dopamin On RA Transfuse 1u pRBC this AM    03/16 0701 - 03/17 0700 In: 665.2 [I.V.:565.2; IV Piggyback:100] Out: 475 [Urine:475]  Filed Weights   09/19/15 0526 09/20/15 0500 09/21/15 0500  Weight: 83.099 kg (183 lb 3.2 oz) 85.1 kg (187 lb 9.8 oz) 86.3 kg (190 lb 4.1 oz)    Scheduled Meds: . acetaminophen  1,000 mg Oral 4 times per day   Or  . acetaminophen (TYLENOL) oral liquid 160 mg/5 mL  1,000 mg Per Tube 4 times per day  . amLODipine  5 mg Oral Daily  . antiseptic oral rinse  7 mL Mouth Rinse QID  . aspirin EC  325 mg Oral Daily   Or  . aspirin  324 mg Per Tube Daily  . atorvastatin  80 mg Oral q1800  . bisacodyl  10 mg Oral Daily   Or  . bisacodyl  10 mg Rectal Daily  . cefUROXime (ZINACEF)  IV  1.5 g Intravenous Q12H  . chlorhexidine gluconate  15 mL Mouth Rinse BID  . docusate sodium  200 mg Oral Daily  . enoxaparin (LOVENOX) injection  30 mg Subcutaneous QHS  . furosemide  80 mg Intravenous Once  . insulin aspart  0-24 Units Subcutaneous 6 times per day  . insulin detemir  10 Units Subcutaneous Daily  . levalbuterol  0.63 mg Nebulization TID  . magnesium sulfate  4 g Intravenous Once  . metoprolol tartrate  12.5 mg Oral BID   Or  . metoprolol tartrate  12.5 mg Per Tube BID  . pantoprazole  40 mg Oral Daily  . sodium chloride flush  3 mL Intravenous Q12H   Continuous Infusions: . sodium chloride Stopped (09/20/15 1032)  . sodium chloride 250 mL (09/20/15 0606)  . sodium chloride Stopped (09/20/15 1032)  . DOPamine 3 mcg/kg/min (09/21/15 0800)  . lactated ringers Stopped (09/19/15 1612)  . lactated ringers 10 mL/hr at 09/21/15 0800  . nitroGLYCERIN Stopped (09/20/15 1030)  . phenylephrine (NEO-SYNEPHRINE) Adult infusion Stopped (09/20/15 0600)   PRN Meds:.sodium  chloride, lactated ringers, metoprolol, morphine injection, ondansetron (ZOFRAN) IV, oxyCODONE, sodium chloride flush, traMADol  Current Labs: reviewed   Physical Exam:  Blood pressure 113/100, pulse 95, temperature 98 F (36.7 C), temperature source Oral, resp. rate 19, height 5\' 2"  (1.575 m), weight 86.3 kg (190 lb 4.1 oz), SpO2 97 %. GEN: awake, alert, lying still ENT: NCAT CV: RRR PULM: CTAB, coarse bs ant ABD: s/nt/nd SKIN: no rashes/lesions EXT:No LEE  A 1. AoCKD3 post CABG, oliguric 2. CKD3 presumed 2/2 DM and HTN: BL SCr 1.5 or so 3. CAD, 3V disease, s/p 4V CABG 3/15  4. DM2 5. HTN 6. HLD  P 1. COnt to support hemodybnamics, agree with transfusion, foley in place 2. BID labs 3. Daily weights, Daily Renal Panel, Strict I/Os, Avoid nephrotoxins (NSAIDs, judicious IV Contrast)    Pearson Grippe MD 09/21/2015, 8:56 AM   Recent Labs Lab 09/19/15 0250  09/20/15 0400 09/20/15 1625 09/20/15 1630 09/21/15 0422  NA 141  < > 140 139  --  139  K 3.8  < > 4.3 4.7  --  4.8  CL 104  < > 107 102  --  106  CO2 26  --  22  --   --  23  GLUCOSE 100*  < > 118* 118*  --  132*  BUN 18  < > 20 26*  --  30*  CREATININE 1.62*  < > 1.98* 2.60* 2.63* 3.34*  CALCIUM 8.3*  --  7.1*  --   --  7.0*  < > = values in this interval not displayed.  Recent Labs Lab 09/20/15 0400 09/20/15 1625 09/20/15 1630 09/21/15 0422  WBC 11.1*  --  15.1* 14.1*  HGB 8.6* 9.5* 8.3* 7.6*  HCT 26.6* 28.0* 26.4* 23.8*  MCV 88.1  --  89.5 88.5  PLT 112*  --  110* 125*

## 2015-09-21 NOTE — Progress Notes (Signed)
Renal MD paged with renal panel results; will await callback; will cont. To monitor.  Monica [name]

## 2015-09-21 NOTE — Evaluation (Signed)
Physical Therapy Evaluation Patient Details Name: Monica [name] MRN: [id number] DOB: September 09, 1930 Today's Date: 09/21/2015   History of Present Illness  Monica [name] is a 82 y.o. female with PMH of hypertension, hyperlipidemia, diabetes mellitus, asthma, GERD, gout, chronic kidney disease-stage III, peptic ulcer disease, who presents with chest pain. For cardiac cath 09/17/2015; underwent CABG x 4 on 09/19/15  Clinical Impression  Patient presents with decreased mobility and cognition following above procedure.  May need STSNF level rehab at d/c depending on progress.  Patient will benefit from skilled PT in the acute setting to allow decreased burden of care.    Follow Up Recommendations SNF    Equipment Recommendations  Other (comment) (TBA)    Recommendations for Other Services       Precautions / Restrictions Precautions Precautions: Fall;Sternal      Mobility  Bed Mobility Overal bed mobility: Needs Assistance Bed Mobility: Supine to Sit     Supine to sit: +2 for physical assistance;Max assist     General bed mobility comments: HOB elevated; assist for legs off bed; and lifting trunk  Transfers Overall transfer level: Needs assistance Equipment used: 2 person hand held assist Transfers: Sit to/from Stand;Stand Pivot Transfers Sit to Stand: +2 physical assistance;Mod assist Stand pivot transfers: Mod assist;+2 physical assistance       General transfer comment: increased time, cues to hold pillow for sternal precautions; lifting assist up from EOB; SPT to recliner with +2 A cues for stepping to chair  Ambulation/Gait                Stairs            Wheelchair Mobility    Modified Rankin (Stroke Patients Only)       Balance Overall balance assessment: Needs assistance         Standing balance support: Bilateral upper extremity supported Standing balance-Leahy Scale: Poor Standing balance comment: UE support for balance                              Pertinent Vitals/Pain Pain Assessment: Faces Faces Pain Scale: Hurts little more Pain Location: chest Pain Descriptors / Indicators: Sore Pain Intervention(s): Monitored during session;Repositioned;Other (comment);Limited activity within patient's tolerance (encouragd splinting with pillow)    Home Living Family/patient expects to be discharged to:: Skilled nursing facility Living Arrangements: Children Available Help at Discharge: Family;Available 24 hours/day Type of Home: House Home Access: Stairs to enter Entrance Stairs-Rails: Right Entrance Stairs-Number of Steps: 3 Home Layout: One level Home Equipment: Walker - 2 wheels      Prior Function Level of Independence: Independent         Comments: still drives to store and church     Hand Dominance        Extremity/Trunk Assessment               Lower Extremity Assessment: RLE deficits/detail;LLE deficits/detail RLE Deficits / Details: AAROM WFL strength grossly 3-/5 LLE Deficits / Details: AAROM WFL strength grossly 3-/5  Cervical / Trunk Assessment: Kyphotic  Communication   Communication: No difficulties  Cognition Arousal/Alertness: Lethargic Behavior During Therapy: Flat affect Overall Cognitive Status: Impaired/Different from baseline Area of Impairment: Orientation;Memory;Following commands;Problem solving;Attention Orientation Level: Disoriented to;Place;Time;Situation Current Attention Level: Focused Memory: Decreased short-term memory Following Commands: Follows one step commands with increased time     Problem Solving: Slow processing;Decreased initiation;Requires verbal cues General Comments: RN reports could get oriented if staying awake  for more than few moments    General Comments      Exercises General Exercises - Lower Extremity Ankle Circles/Pumps: AAROM;Both;5 reps;Supine Short Arc Quad: AAROM;Both;Supine (2) Heel Slides: AAROM;Both;Other reps (comment);Supine  (2)      Assessment/Plan    PT Assessment Patient needs continued PT services  PT Diagnosis Difficulty walking;Generalized weakness   PT Problem List Decreased strength;Decreased mobility;Decreased balance;Pain;Decreased activity tolerance;Decreased knowledge of use of DME;Decreased cognition  PT Treatment Interventions     PT Goals (Current goals can be found in the Care Plan section) Acute Rehab PT Goals Patient Stated Goal: None stated, no family present PT Goal Formulation: Patient unable to participate in goal setting Time For Goal Achievement: 09/28/15 Potential to Achieve Goals: Fair    Frequency Min 3X/week   Barriers to discharge        Co-evaluation               End of Session Equipment Utilized During Treatment: Gait belt Activity Tolerance: Patient limited by lethargy Patient left: in chair;with call bell/phone within reach;with chair alarm set;with nursing/sitter in room           Time: 1050-1116 PT Time Calculation (min) (ACUTE ONLY): 26 min   Charges:   PT Evaluation $PT Re-evaluation: 1 Procedure PT Treatments $Gait Training: 8-22 mins   PT G CodesReginia Naas Oct 14, 2015, 4:45 PM  Magda Kiel, [place] 10/14/15

## 2015-09-22 ENCOUNTER — Inpatient Hospital Stay (HOSPITAL_COMMUNITY): Payer: Medicare HMO

## 2015-09-22 LAB — GLUCOSE, CAPILLARY
GLUCOSE-CAPILLARY: 80 mg/dL (ref 65–99)
GLUCOSE-CAPILLARY: 94 mg/dL (ref 65–99)
Glucose-Capillary: 147 mg/dL — ABNORMAL HIGH (ref 65–99)
Glucose-Capillary: 198 mg/dL — ABNORMAL HIGH (ref 65–99)
Glucose-Capillary: 69 mg/dL (ref 65–99)
Glucose-Capillary: 70 mg/dL (ref 65–99)
Glucose-Capillary: 76 mg/dL (ref 65–99)
Glucose-Capillary: 80 mg/dL (ref 65–99)

## 2015-09-22 LAB — CBC
HCT: 26.3 % — ABNORMAL LOW (ref 36.0–46.0)
Hemoglobin: 8.5 g/dL — ABNORMAL LOW (ref 12.0–15.0)
MCH: 28.3 pg (ref 26.0–34.0)
MCHC: 32.3 g/dL (ref 30.0–36.0)
MCV: 87.7 fL (ref 78.0–100.0)
Platelets: 146 10*3/uL — ABNORMAL LOW (ref 150–400)
RBC: 3 MIL/uL — ABNORMAL LOW (ref 3.87–5.11)
RDW: 14.8 % (ref 11.5–15.5)
WBC: 14.5 10*3/uL — ABNORMAL HIGH (ref 4.0–10.5)

## 2015-09-22 LAB — BASIC METABOLIC PANEL
Anion gap: 14 (ref 5–15)
Anion gap: 11 (ref 5–15)
BUN: 44 mg/dL — ABNORMAL HIGH (ref 6–20)
BUN: 51 mg/dL — ABNORMAL HIGH (ref 6–20)
CHLORIDE: 103 mmol/L (ref 101–111)
CO2: 23 mmol/L (ref 22–32)
CO2: 23 mmol/L (ref 22–32)
Calcium: 7 mg/dL — ABNORMAL LOW (ref 8.9–10.3)
Calcium: 6.9 mg/dL — ABNORMAL LOW (ref 8.9–10.3)
Chloride: 102 mmol/L (ref 101–111)
Creatinine, Ser: 4.52 mg/dL — ABNORMAL HIGH (ref 0.44–1.00)
Creatinine, Ser: 4.6 mg/dL — ABNORMAL HIGH (ref 0.44–1.00)
GFR calc Af Amer: 9 mL/min — ABNORMAL LOW (ref >60)
GFR calc non Af Amer: 8 mL/min — ABNORMAL LOW (ref >60)
GFR calc non Af Amer: 8 mL/min — ABNORMAL LOW (ref >60)
GFR, EST AFRICAN AMERICAN: 9 mL/min — AB (ref >60)
Glucose, Bld: 79 mg/dL (ref 65–99)
Glucose, Bld: 200 mg/dL — ABNORMAL HIGH (ref 65–99)
POTASSIUM: 5.1 mmol/L (ref 3.5–5.1)
Potassium: 4.5 mmol/L (ref 3.5–5.1)
SODIUM: 137 mmol/L (ref 135–145)
Sodium: 139 mmol/L (ref 135–145)

## 2015-09-22 LAB — TYPE AND SCREEN
ABO/RH(D): O POS
Antibody Screen: NEGATIVE
Unit division: 0
Unit division: 0
Unit division: 0

## 2015-09-22 MED ORDER — DEXTROSE 5 % IV SOLN
120.0000 mg | Freq: Once | INTRAVENOUS | Status: AC
  Administered 2015-09-22: 120 mg via INTRAVENOUS
  Filled 2015-09-22: qty 12

## 2015-09-22 NOTE — Progress Notes (Signed)
Admit: 09/14/2015 LOS: 3  68F with CKD3, stable, s/p 3V CABG 3/15  Subjective:  SCr worsened ot 4.5, oliguric Tolerating clear liquids BP running soft on MTP + CCB + dopa gtt    03/17 0701 - 03/18 0700 In: 1483.1 [P.O.:780; I.V.:338.1; Blood:365] Out: 310 [Urine:310]  Filed Weights   09/20/15 0500 09/21/15 0500 09/22/15 0500  Weight: 85.1 kg (187 lb 9.8 oz) 86.3 kg (190 lb 4.1 oz) 89.6 kg (197 lb 8.5 oz)    Scheduled Meds: . acetaminophen  1,000 mg Oral 4 times per day   Or  . acetaminophen (TYLENOL) oral liquid 160 mg/5 mL  1,000 mg Per Tube 4 times per day  . antiseptic oral rinse  7 mL Mouth Rinse BID  . antiseptic oral rinse  7 mL Mouth Rinse QID  . aspirin EC  325 mg Oral Daily   Or  . aspirin  324 mg Per Tube Daily  . atorvastatin  80 mg Oral q1800  . bisacodyl  10 mg Oral Daily   Or  . bisacodyl  10 mg Rectal Daily  . chlorhexidine gluconate  15 mL Mouth Rinse BID  . docusate sodium  200 mg Oral Daily  . enoxaparin (LOVENOX) injection  30 mg Subcutaneous QHS  . insulin aspart  0-24 Units Subcutaneous 6 times per day  . insulin detemir  10 Units Subcutaneous Daily  . levalbuterol  0.63 mg Nebulization TID  . magnesium sulfate  4 g Intravenous Once  . metoprolol tartrate  12.5 mg Oral BID   Or  . metoprolol tartrate  12.5 mg Per Tube BID  . pantoprazole  40 mg Oral Daily  . sodium chloride flush  3 mL Intravenous Q12H   Continuous Infusions: . sodium chloride Stopped (09/20/15 1032)  . sodium chloride 250 mL (09/20/15 0606)  . sodium chloride Stopped (09/20/15 1032)  . DOPamine 3 mcg/kg/min (09/22/15 0600)  . lactated ringers Stopped (09/19/15 1612)  . lactated ringers 10 mL/hr at 09/22/15 0600  . nitroGLYCERIN Stopped (09/20/15 1030)  . phenylephrine (NEO-SYNEPHRINE) Adult infusion Stopped (09/20/15 0600)   PRN Meds:.sodium chloride, lactated ringers, metoprolol, ondansetron (ZOFRAN) IV, oxyCODONE, sodium chloride flush, traMADol  Current Labs: reviewed    Physical Exam:  Blood pressure 107/56, pulse 88, temperature 98.1 F (36.7 C), temperature source Oral, resp. rate 27, height 5\' 2"  (1.575 m), weight 89.6 kg (197 lb 8.5 oz), SpO2 100 %. GEN: awake, alert, lying still ENT: NCAT CV: RRR PULM: CTAB, coarse bs ant ABD: s/nt/nd SKIN: no rashes/lesions EXT:No LEE  A 1. AoCKD3 post CABG, oliguric; 2.3L blood loss reported at time of CABG 2. CKD3 presumed 2/2 DM and HTN: BL SCr 1.5 or so 3. CAD, 3V disease, s/p 4V CABG 3/15  4. DM2 5. HTN 6. HLD  P 1. Hold amlodipine for soft BPs 2. BID labs 3. No RRT needs at this time, hopefully can turn corner 4. Daily weights, Daily Renal Panel, Strict I/Os, Avoid nephrotoxins (NSAIDs, judicious IV Contrast)    Pearson Grippe MD 09/22/2015, 7:53 AM   Recent Labs Lab 09/21/15 0422 09/21/15 1555 09/22/15 0547  NA 139 137 139  K 4.8 5.2* 4.5  CL 106 102 102  CO2 23 22 23   GLUCOSE 132* 314* 79  BUN 30* 37* 44*  CREATININE 3.34* 4.04* 4.52*  CALCIUM 7.0* 6.8* 7.0*    Recent Labs Lab 09/21/15 0422 09/21/15 1920 09/22/15 0547  WBC 14.1* 14.6* 14.5*  HGB 7.6* 8.6* 8.5*  HCT 23.8* 26.6* 26.3*  MCV 88.5 87.5 87.7  PLT 125* 130* 146*

## 2015-09-22 NOTE — Progress Notes (Addendum)
Patient's 0400 blood glucose was 70. Patient given 8oz of Apple juice. Will check blood glucose in 15 minutes and will continue to monitor patient closely.  Sydnee Cabal. Lovena Le RN     Patient's follow up CBG was 80. Will continue to monitor patient closely.  Sydnee Cabal. Lovena Le RN

## 2015-09-22 NOTE — Progress Notes (Signed)
      [address]       ,[place] [address]             [phone]        CARDIOTHORACIC SURGERY PROGRESS NOTE   R3 Days Post-Op Procedure(s) (LRB): CORONARY ARTERY BYPASS GRAFTING (CABG) TIMES FOUR USING BILATARAL SAPHENOUS VEIN GRAFTS AND LEFT INTERNAL MAMMARY ARTERY (N/A) TRANSESOPHAGEAL ECHOCARDIOGRAM (TEE) (N/A)  Subjective: Still confused but arousable  Objective: Vital signs: BP Readings from Last 1 Encounters:  09/22/15 115/68   Pulse Readings from Last 1 Encounters:  09/22/15 93   Resp Readings from Last 1 Encounters:  09/22/15 23   Temp Readings from Last 1 Encounters:  09/22/15 97.6 F (36.4 C) Oral    Hemodynamics:    Physical Exam:  Rhythm:   NSR  Breath sounds: Scattered rhonchi  Heart sounds:  RRR  Incisions:  Dressings dry, intact  Abdomen:  Soft, non-distended, non-tender  Extremities:  Warm, well-perfused    Intake/Output from previous day: 03/17 0701 - 03/18 0700 In: 1497.8 [P.O.:780; I.V.:352.8; Blood:365] Out: 320 [Urine:320] Intake/Output this shift: Total I/O In: 14.7 [I.V.:14.7] Out: 10 [Urine:10]  Lab Results:  CBC: Recent Labs  09/21/15 1920 09/22/15 0547  WBC 14.6* 14.5*  HGB 8.6* 8.5*  HCT 26.6* 26.3*  PLT 130* 146*    BMET:  Recent Labs  09/21/15 1555 09/22/15 0547  NA 137 139  K 5.2* 4.5  CL 102 102  CO2 22 23  GLUCOSE 314* 79  BUN 37* 44*  CREATININE 4.04* 4.52*  CALCIUM 6.8* 7.0*     PT/INR:   Recent Labs  09/19/15 1540  LABPROT 18.4*  INR 1.53*    CBG (last 3)   Recent Labs  09/22/15 0409 09/22/15 0437 09/22/15 0903  GLUCAP 70 80 94    ABG    Component Value Date/Time   PHART 7.273* 09/21/2015 1628   PCO2ART 44.1 09/21/2015 1628   PO2ART 55.0* 09/21/2015 1628   HCO3 20.4 09/21/2015 1628   TCO2 22 09/21/2015 1628   ACIDBASEDEF 6.0* 09/21/2015 1628   O2SAT 84.0 09/21/2015 1628    CXR: PORTABLE CHEST 1 VIEW  COMPARISON: Chest radiograph from one day  prior.  FINDINGS: Sternotomy wires appear aligned and intact. Right internal jugular central venous sheath terminates in the upper third of the superior vena cava. Slightly low lung volumes. Stable cardiomediastinal silhouette with mild cardiomegaly. No pneumothorax. Stable small left pleural effusion. Mild-to-moderate pulmonary edema appears slightly worsened. Bilateral lower lobe opacities appear unchanged, favor atelectasis.  IMPRESSION: 1. Mild-to-moderate congestive heart failure, slightly worsened. 2. Stable small left pleural effusion. 3. Stable bilateral lower lobe opacities, favor atelectasis.   Electronically Signed  By: Ilona Sorrel M.D.  On: 09/22/2015 10:08  Assessment/Plan: S/P Procedure(s) (LRB): CORONARY ARTERY BYPASS GRAFTING (CABG) TIMES FOUR USING BILATARAL SAPHENOUS VEIN GRAFTS AND LEFT INTERNAL MAMMARY ARTERY (N/A) TRANSESOPHAGEAL ECHOCARDIOGRAM (TEE) (N/A)  Remains essentially stable in NSR w/ stable BP Breathing stable w/ O2 sats 100% on 3 L/min via [place] Remains disoriented but cooperative.  Denies pain and follows simple commands By report some coughing when trying to eat earlier today Very deconditioned. Remains in acute oliguric renal failure, minimal response to IV lasix - BUN/Cr up to 44/4.5   Mobilize as much as possible  Watch respiratory status closely  Watch electrolytes, volume status, and mental status closely  SLP eval for swallowing - high risk for aspiration  Rexene Alberts, MD 09/22/2015 10:22 AM

## 2015-09-23 ENCOUNTER — Inpatient Hospital Stay (HOSPITAL_COMMUNITY): Payer: Medicare HMO

## 2015-09-23 DIAGNOSIS — Z951 Presence of aortocoronary bypass graft: Secondary | ICD-10-CM

## 2015-09-23 LAB — GLUCOSE, CAPILLARY
GLUCOSE-CAPILLARY: 208 mg/dL — AB (ref 65–99)
GLUCOSE-CAPILLARY: 134 mg/dL — AB (ref 65–99)
Glucose-Capillary: 101 mg/dL — ABNORMAL HIGH (ref 65–99)
Glucose-Capillary: 104 mg/dL — ABNORMAL HIGH (ref 65–99)
Glucose-Capillary: 133 mg/dL — ABNORMAL HIGH (ref 65–99)
Glucose-Capillary: 142 mg/dL — ABNORMAL HIGH (ref 65–99)
Glucose-Capillary: 143 mg/dL — ABNORMAL HIGH (ref 65–99)

## 2015-09-23 LAB — COMPREHENSIVE METABOLIC PANEL
ALT: 30 U/L (ref 14–54)
ANION GAP: 12 (ref 5–15)
AST: 24 U/L (ref 15–41)
Albumin: 2 g/dL — ABNORMAL LOW (ref 3.5–5.0)
Alkaline Phosphatase: 63 U/L (ref 38–126)
BUN: 55 mg/dL — ABNORMAL HIGH (ref 6–20)
CHLORIDE: 102 mmol/L (ref 101–111)
CO2: 23 mmol/L (ref 22–32)
Calcium: 6.9 mg/dL — ABNORMAL LOW (ref 8.9–10.3)
Creatinine, Ser: 4.83 mg/dL — ABNORMAL HIGH (ref 0.44–1.00)
GFR calc non Af Amer: 8 mL/min — ABNORMAL LOW (ref >60)
GFR, EST AFRICAN AMERICAN: 9 mL/min — AB (ref >60)
Glucose, Bld: 158 mg/dL — ABNORMAL HIGH (ref 65–99)
Potassium: 4.9 mmol/L (ref 3.5–5.1)
SODIUM: 137 mmol/L (ref 135–145)
Total Bilirubin: 0.4 mg/dL (ref 0.3–1.2)
Total Protein: 5.3 g/dL — ABNORMAL LOW (ref 6.5–8.1)

## 2015-09-23 LAB — CBC
HCT: 24.5 % — ABNORMAL LOW (ref 36.0–46.0)
Hemoglobin: 8.1 g/dL — ABNORMAL LOW (ref 12.0–15.0)
MCH: 28.9 pg (ref 26.0–34.0)
MCHC: 33.1 g/dL (ref 30.0–36.0)
MCV: 87.5 fL (ref 78.0–100.0)
PLATELETS: 188 10*3/uL (ref 150–400)
RBC: 2.8 MIL/uL — ABNORMAL LOW (ref 3.87–5.11)
RDW: 14.8 % (ref 11.5–15.5)
WBC: 13.9 10*3/uL — ABNORMAL HIGH (ref 4.0–10.5)

## 2015-09-23 MED ORDER — METOPROLOL TARTRATE 1 MG/ML IV SOLN
5.0000 mg | Freq: Four times a day (QID) | INTRAVENOUS | Status: DC
  Administered 2015-09-23 – 2015-09-25 (×8): 5 mg via INTRAVENOUS
  Filled 2015-09-23 (×8): qty 5

## 2015-09-23 MED ORDER — PANTOPRAZOLE SODIUM 40 MG IV SOLR
40.0000 mg | INTRAVENOUS | Status: DC
  Administered 2015-09-23 – 2015-09-25 (×3): 40 mg via INTRAVENOUS
  Filled 2015-09-23 (×3): qty 40

## 2015-09-23 MED ORDER — FUROSEMIDE 10 MG/ML IJ SOLN
120.0000 mg | Freq: Once | INTRAVENOUS | Status: AC
  Administered 2015-09-23: 120 mg via INTRAVENOUS
  Filled 2015-09-23: qty 12

## 2015-09-23 NOTE — Progress Notes (Signed)
TCTS BRIEF SICU PROGRESS NOTE  4 Days Post-Op  S/P Procedure(s) (LRB): CORONARY ARTERY BYPASS GRAFTING (CABG) TIMES FOUR USING BILATARAL SAPHENOUS VEIN GRAFTS AND LEFT INTERNAL MAMMARY ARTERY (N/A) TRANSESOPHAGEAL ECHOCARDIOGRAM (TEE) (N/A)   Stable day Somewhat more alert, breathing comfortably, looks better UOP improved  Plan: Continue current plan  Rexene Alberts, MD 09/23/2015 6:52 PM

## 2015-09-23 NOTE — Evaluation (Signed)
Clinical/Bedside Swallow Evaluation Patient Details  Name: Monica [name] MRN: [id number] Date of Birth: 09/19/1930  Today's Date: 09/23/2015 Time: SLP Start Time (ACUTE ONLY): [id number] SLP Stop Time (ACUTE ONLY): 0948 SLP Time Calculation (min) (ACUTE ONLY): 20 min  Past Medical History:  Past Medical History  Diagnosis Date  . Peptic ulcer   . Mass   . Essential hypertension   . Hyperlipidemia   . Type 2 diabetes mellitus ([place])   . Asthma   . History of blood transfusion   . CKD (chronic kidney disease), stage III    Past Surgical History:  Past Surgical History  Procedure Laterality Date  . Abdominal hysterectomy    . Colon surgery    . Cardiac catheterization N/A 09/17/2015    Procedure: Left Heart Cath and Coronary Angiography;  Surgeon: Jettie Booze, MD;  Location: [facility];  Service: Cardiovascular;  Laterality: N/A;  . Coronary artery bypass graft N/A 09/19/2015    Procedure: CORONARY ARTERY BYPASS GRAFTING (CABG) TIMES FOUR USING BILATARAL SAPHENOUS VEIN GRAFTS AND LEFT INTERNAL MAMMARY ARTERY;  Surgeon: [name], MD;  Location: [facility];  Service: Open Heart Surgery;  Laterality: N/A;  . Tee without cardioversion N/A 09/19/2015    Procedure: TRANSESOPHAGEAL ECHOCARDIOGRAM (TEE);  Surgeon: [name], MD;  Location: [facility];  Service: Open Heart Surgery;  Laterality: N/A;   HPI:  Monica [name] is a 82 y.o. female with PMH of hypertension, hyperlipidemia, diabetes mellitus, asthma, GERD, gout, chronic kidney disease-stage III, peptic ulcer disease, who presents with chest pain.  The patient is s/p CABG x 4 and per nursing has been coughing with intake.  Per nursing the patient was relatively independent prior to admission with not reported swallowing issues.  Current chest xray is showing stable mild-to-moderate congestive heart failure, stable small left pleural effusion and bibasilar lung opacities, left greater than right, slightly   Assessment / Plan /  Recommendation Clinical Impression  Clinical swallowing evaluation was completed.  Oral mechanism exam was unable to be completed as the patient was unable to follow commands.  She was also lethargic and difficult to fully rouse.  The patient was intubated for a little over a day for her surgery and extubated on 09/20/2015.  Of note, during oral care the patient was noted to have a gag reflex.  The patient's son reported no issues swallowing prior to admission.  Nursing reported that the patient had done well with her breakfast tray yesterday but with her lunch tray there was a significant amount of coughing.   Clinically today the patient presented with oropharyngeal dysphagia characterized by delayed oral transit and delayed swallow trigger.  Immediate throat clear was noted post swallow across textures as well as a wet vocal quality.  Recommend keeping the patient NPO pending re assessment tomorrow.  Objective may need to be considered.      Aspiration Risk  Moderate aspiration risk    Diet Recommendation   NPO pending re evaluation next date.    Medication Administration: Via alternative means    Other  Recommendations Oral Care Recommendations: Oral care QID Other Recommendations: Have oral suction available   Follow up Recommendations   (To be determined.  )    Frequency and Duration min 2x/week  2 weeks       Prognosis Prognosis for Safe Diet Advancement: Good Barriers to Reach Goals: Cognitive deficits      Swallow Study   General Date of Onset: 09/14/15 HPI: Monica [name]  is a 82 y.o. female with PMH of hypertension, hyperlipidemia, diabetes mellitus, asthma, GERD, gout, chronic kidney disease-stage III, peptic ulcer disease, who presents with chest pain.  The patient is s/p CABG x 4 and per nursing has been coughing with intake.  Per nursing the patient was relatively independent prior to admission with not reported swallowing issues.  Current chest xray is showing stable  mild-to-moderate congestive heart failure, stable small left pleural effusion and bibasilar lung opacities, left greater than right, slightly Type of Study: Bedside Swallow Evaluation Previous Swallow Assessment: None noted.   Diet Prior to this Study: NPO Temperature Spikes Noted: Yes Respiratory Status: Nasal cannula History of Recent Intubation: Yes Length of Intubations (days): 1 days Date extubated: 09/20/15 Behavior/Cognition: Cooperative;Requires cueing;Doesn't follow directions;Lethargic/Drowsy Oral Cavity Assessment: Within Functional Limits Oral Care Completed by SLP: Yes Oral Cavity - Dentition: Edentulous Vision: Functional for self-feeding Self-Feeding Abilities: Needs assist Patient Positioning: Partially reclined Baseline Vocal Quality: Low vocal intensity Volitional Cough: Cognitively unable to elicit Volitional Swallow: Unable to elicit    Oral/Motor/Sensory Function Overall Oral Motor/Sensory Function: Other (comment) (Limited - pt unable to follow commands)   Ice Chips Ice chips: Impaired Presentation: Spoon Oral Phase Impairments: Impaired mastication Oral Phase Functional Implications: Prolonged oral transit Pharyngeal Phase Impairments: Suspected delayed Swallow;Wet Vocal Quality;Throat Clearing - Immediate   Thin Liquid Thin Liquid: Impaired Presentation: Spoon Oral Phase Impairments: Impaired mastication Oral Phase Functional Implications: Prolonged oral transit Pharyngeal  Phase Impairments: Suspected delayed Swallow;Wet Vocal Quality;Throat Clearing - Immediate    Nectar Thick Nectar Thick Liquid: Not tested   Honey Thick Honey Thick Liquid: Not tested   Puree Puree: Impaired Presentation: Spoon Oral Phase Impairments: Impaired mastication Oral Phase Functional Implications: Prolonged oral transit Pharyngeal Phase Impairments: Suspected delayed Swallow;Wet Vocal Quality;Throat Clearing - Immediate   Solid   GO   Solid: Not tested    Functional  Assessment Tool Used: ASHA NOMS and clinical judgment.   Functional Limitations: Swallowing Swallow Current Status [id number]): 100 percent impaired, limited or restricted Swallow Goal Status [id number]): At least 40 percent but less than 60 percent impaired, limited or restricted   Monica [name], [place], [place] Monica [name] N 09/23/2015,10:13 AM

## 2015-09-23 NOTE — Progress Notes (Signed)
Admit: 09/14/2015 LOS: 4  11F with CKD3, stable, s/p 3V CABG 3/15  Subjective:  Slight worsening in GFR overnight K and HCO3 ok UOP 860, picking up some BP stable; up some this AM    03/18 0701 - 03/19 0700 In: 207.6 [I.V.:207.6] Out: 860 [Urine:860]  Filed Weights   09/21/15 0500 09/22/15 0500 09/23/15 0411  Weight: 86.3 kg (190 lb 4.1 oz) 89.6 kg (197 lb 8.5 oz) 87.1 kg (192 lb 0.3 oz)    Scheduled Meds: . acetaminophen  1,000 mg Oral 4 times per day   Or  . acetaminophen (TYLENOL) oral liquid 160 mg/5 mL  1,000 mg Per Tube 4 times per day  . antiseptic oral rinse  7 mL Mouth Rinse BID  . antiseptic oral rinse  7 mL Mouth Rinse QID  . aspirin EC  325 mg Oral Daily   Or  . aspirin  324 mg Per Tube Daily  . bisacodyl  10 mg Oral Daily   Or  . bisacodyl  10 mg Rectal Daily  . chlorhexidine gluconate  15 mL Mouth Rinse BID  . docusate sodium  200 mg Oral Daily  . enoxaparin (LOVENOX) injection  30 mg Subcutaneous QHS  . insulin aspart  0-24 Units Subcutaneous 6 times per day  . levalbuterol  0.63 mg Nebulization TID  . metoprolol tartrate  12.5 mg Oral BID   Or  . metoprolol tartrate  12.5 mg Per Tube BID  . pantoprazole  40 mg Oral Daily  . sodium chloride flush  3 mL Intravenous Q12H   Continuous Infusions: . sodium chloride 250 mL (09/20/15 0606)  . DOPamine 3 mcg/kg/min (09/22/15 2000)   PRN Meds:.metoprolol, ondansetron (ZOFRAN) IV, sodium chloride flush  Current Labs: reviewed   Physical Exam:  Blood pressure 115/50, pulse 81, temperature 98 F (36.7 C), temperature source Oral, resp. rate 11, height 5\' 2"  (1.575 m), weight 87.1 kg (192 lb 0.3 oz), SpO2 100 %. GEN: awake, alert, lying still ENT: NCAT CV: RRR PULM: CTAB, coarse bs ant ABD: s/nt/nd SKIN: no rashes/lesions EXT:No LEE  A 1. AoCKD3 post CABG, oliguric; 2.3L blood loss reported at time of CABG 2. CKD3 presumed 2/2 DM and HTN: BL SCr 1.5 or so 3. CAD, 3V disease, s/p 4V CABG 3/15   4. DM2 5. HTN 6. HLD  P 1. Holding amlodipine for soft BPs 2. BID labs 3. No RRT needs at this time, hopefully can turn corner  4. Daily weights, Daily Renal Panel, Strict I/Os, Avoid nephrotoxins (NSAIDs, judicious IV Contrast)    Pearson Grippe MD 09/23/2015, 7:40 AM   Recent Labs Lab 09/22/15 0547 09/22/15 1900 09/23/15 0421  NA 139 137 137  K 4.5 5.1 4.9  CL 102 103 102  CO2 23 23 23   GLUCOSE 79 200* 158*  BUN 44* 51* 55*  CREATININE 4.52* 4.60* 4.83*  CALCIUM 7.0* 6.9* 6.9*    Recent Labs Lab 09/21/15 1920 09/22/15 0547 09/23/15 0421  WBC 14.6* 14.5* 13.9*  HGB 8.6* 8.5* 8.1*  HCT 26.6* 26.3* 24.5*  MCV 87.5 87.7 87.5  PLT 130* 146* 188

## 2015-09-23 NOTE — Progress Notes (Addendum)
[address]       [place] [address]             [phone]        CARDIOTHORACIC SURGERY PROGRESS NOTE   R4 Days Post-Op Procedure(s) (LRB): CORONARY ARTERY BYPASS GRAFTING (CABG) TIMES FOUR USING BILATARAL SAPHENOUS VEIN GRAFTS AND LEFT INTERNAL MAMMARY ARTERY (N/A) TRANSESOPHAGEAL ECHOCARDIOGRAM (TEE) (N/A)  Subjective: Seems more alert today.  Follows commands and knows her daughter's name.  Denies pain, SOB.  Clearing her throat continuously.  Looks to be high risk for aspiration.  Objective: Vital signs: BP Readings from Last 1 Encounters:  09/23/15 115/45   Pulse Readings from Last 1 Encounters:  09/23/15 83   Resp Readings from Last 1 Encounters:  09/23/15 21   Temp Readings from Last 1 Encounters:  09/23/15 98.3 F (36.8 C) Oral    Hemodynamics:    Physical Exam:  Rhythm:   sinus  Breath sounds: clear  Heart sounds:  RRR  Incisions:  Clean and dry  Abdomen:  Soft, non-distended, non-tender  Extremities:  Warm, well-perfused    Intake/Output from previous day: 03/18 0701 - 03/19 0700 In: 207.6 [I.V.:207.6] Out: 860 [Urine:860] Intake/Output this shift: Total I/O In: 9.4 [I.V.:9.4] Out: 150 [Urine:150]  Lab Results:  CBC: Recent Labs  09/22/15 0547 09/23/15 0421  WBC 14.5* 13.9*  HGB 8.5* 8.1*  HCT 26.3* 24.5*  PLT 146* 188    BMET:  Recent Labs  09/22/15 1900 09/23/15 0421  NA 137 137  K 5.1 4.9  CL 103 102  CO2 23 23  GLUCOSE 200* 158*  BUN 51* 55*  CREATININE 4.60* 4.83*  CALCIUM 6.9* 6.9*     PT/INR:  No results for input(s): LABPROT, INR in the last 72 hours.  CBG (last 3)   Recent Labs  09/23/15 0016 09/23/15 0329 09/23/15 0737  GLUCAP 142* 143* 101*    ABG    Component Value Date/Time   PHART 7.273* 09/21/2015 1628   PCO2ART 44.1 09/21/2015 1628   PO2ART 55.0* 09/21/2015 1628   HCO3 20.4 09/21/2015 1628   TCO2 22 09/21/2015 1628   ACIDBASEDEF 6.0* 09/21/2015 1628   O2SAT 84.0  09/21/2015 1628    CXR: PORTABLE CHEST 1 VIEW  COMPARISON: Chest radiograph from one day prior.  FINDINGS: Right internal jugular central venous sheath terminates in the right brachiocephalic vein. Sternotomy wires appear aligned and intact. Stable cardiomediastinal silhouette with mild-to-moderate cardiomegaly. No pneumothorax. Stable small left pleural effusion. Mild-to-moderate pulmonary edema is not appreciably changed. Left greater than right lung base opacities, slightly decreased on the right and stable on the left.  IMPRESSION: 1. Stable mild-to-moderate congestive heart failure. 2. Stable small left pleural effusion. 3. Bibasilar lung opacities, left greater than right, slightly improved on the right and stable on the left, favor atelectasis.   Electronically Signed  By: Ilona Sorrel M.D.  On: 09/23/2015 08:09   Assessment/Plan: S/P Procedure(s) (LRB): CORONARY ARTERY BYPASS GRAFTING (CABG) TIMES FOUR USING BILATARAL SAPHENOUS VEIN GRAFTS AND LEFT INTERNAL MAMMARY ARTERY (N/A) TRANSESOPHAGEAL ECHOCARDIOGRAM (TEE) (N/A)  Remains essentially stable in NSR w/ stable BP Breathing stable w/ O2 sats 100% on 2 L/min via [place] Remains disoriented but cooperative, overall looks more alert today. Denies pain and follows simple commands Remains in acute oliguric renal failure, minimal response to IV lasix yesterday - BUN/Cr up to 55/4.8 - potassium stable 4.9 - UOP has increased some last 24 hours Likely high risk for aspiration  Very deconditioned Protein-depleted severe malnutrition   Try another dose lasix today  Mobilize as much as possible  Watch respiratory status closely  Watch electrolytes, volume status, and mental status closely  SLP following - for MBS tomorrow - keep NPO for now  Consider alternative nutritional support if unable to resume [place], MD 09/23/2015 10:49 AM

## 2015-09-24 ENCOUNTER — Inpatient Hospital Stay (HOSPITAL_COMMUNITY): Payer: Medicare HMO

## 2015-09-24 LAB — GLUCOSE, CAPILLARY
GLUCOSE-CAPILLARY: 110 mg/dL — AB (ref 65–99)
GLUCOSE-CAPILLARY: 113 mg/dL — AB (ref 65–99)
Glucose-Capillary: 131 mg/dL — ABNORMAL HIGH (ref 65–99)
Glucose-Capillary: 110 mg/dL — ABNORMAL HIGH (ref 65–99)
Glucose-Capillary: 125 mg/dL — ABNORMAL HIGH (ref 65–99)
Glucose-Capillary: 129 mg/dL — ABNORMAL HIGH (ref 65–99)

## 2015-09-24 LAB — COMPREHENSIVE METABOLIC PANEL
ALT: 29 U/L (ref 14–54)
ANION GAP: 13 (ref 5–15)
AST: 24 U/L (ref 15–41)
Albumin: 2.1 g/dL — ABNORMAL LOW (ref 3.5–5.0)
Alkaline Phosphatase: 50 U/L (ref 38–126)
BILIRUBIN TOTAL: 0.6 mg/dL (ref 0.3–1.2)
BUN: 64 mg/dL — ABNORMAL HIGH (ref 6–20)
CO2: 23 mmol/L (ref 22–32)
Calcium: 7.3 mg/dL — ABNORMAL LOW (ref 8.9–10.3)
Chloride: 102 mmol/L (ref 101–111)
Creatinine, Ser: 4.2 mg/dL — ABNORMAL HIGH (ref 0.44–1.00)
GFR, EST AFRICAN AMERICAN: 10 mL/min — AB (ref >60)
GFR, EST NON AFRICAN AMERICAN: 9 mL/min — AB (ref >60)
Glucose, Bld: 139 mg/dL — ABNORMAL HIGH (ref 65–99)
POTASSIUM: 4.5 mmol/L (ref 3.5–5.1)
Sodium: 138 mmol/L (ref 135–145)
TOTAL PROTEIN: 5.7 g/dL — AB (ref 6.5–8.1)

## 2015-09-24 LAB — IRON AND TIBC
Iron: 13 ug/dL — ABNORMAL LOW (ref 28–170)
SATURATION RATIOS: 9 % — AB (ref 10.4–31.8)
TIBC: 139 ug/dL — ABNORMAL LOW (ref 250–450)
UIBC: 126 ug/dL

## 2015-09-24 LAB — CBC
HEMATOCRIT: 25.3 % — AB (ref 36.0–46.0)
Hemoglobin: 8.2 g/dL — ABNORMAL LOW (ref 12.0–15.0)
MCH: 28.3 pg (ref 26.0–34.0)
MCHC: 32.4 g/dL (ref 30.0–36.0)
MCV: 87.2 fL (ref 78.0–100.0)
PLATELETS: 228 10*3/uL (ref 150–400)
RBC: 2.9 MIL/uL — ABNORMAL LOW (ref 3.87–5.11)
RDW: 14.5 % (ref 11.5–15.5)
WBC: 11.9 10*3/uL — ABNORMAL HIGH (ref 4.0–10.5)

## 2015-09-24 LAB — FERRITIN: FERRITIN: 388 ng/mL — AB (ref 11–307)

## 2015-09-24 LAB — PREALBUMIN: PREALBUMIN: 6.9 mg/dL — AB (ref 18–38)

## 2015-09-24 LAB — BRAIN NATRIURETIC PEPTIDE: B NATRIURETIC PEPTIDE 5: 892.4 pg/mL — AB (ref 0.0–100.0)

## 2015-09-24 MED ORDER — PRO-STAT SUGAR FREE PO LIQD
30.0000 mL | Freq: Three times a day (TID) | ORAL | Status: DC
  Administered 2015-09-24 – 2015-09-26 (×5): 30 mL
  Filled 2015-09-24 (×4): qty 30

## 2015-09-24 MED ORDER — NEPRO/CARBSTEADY PO LIQD
1000.0000 mL | ORAL | Status: DC
  Administered 2015-09-24 – 2015-09-25 (×2): 1000 mL
  Filled 2015-09-24 (×6): qty 1000

## 2015-09-24 MED ORDER — LIDOCAINE HCL 1 % IJ SOLN
INTRAMUSCULAR | Status: AC
  Filled 2015-09-24: qty 20

## 2015-09-24 MED FILL — Lidocaine HCl IV Inj 20 MG/ML: INTRAVENOUS | Qty: 5 | Status: AC

## 2015-09-24 MED FILL — Sodium Bicarbonate IV Soln 8.4%: INTRAVENOUS | Qty: 50 | Status: AC

## 2015-09-24 MED FILL — Mannitol IV Soln 20%: INTRAVENOUS | Qty: 500 | Status: AC

## 2015-09-24 MED FILL — Sodium Chloride IV Soln 0.9%: INTRAVENOUS | Qty: 2000 | Status: AC

## 2015-09-24 MED FILL — Electrolyte-R (PH 7.4) Solution: INTRAVENOUS | Qty: 5000 | Status: AC

## 2015-09-24 MED FILL — Heparin Sodium (Porcine) Inj 1000 Unit/ML: INTRAMUSCULAR | Qty: 10 | Status: AC

## 2015-09-24 NOTE — Consult Note (Addendum)
NEURO HOSPITALIST CONSULT NOTE   Requestig physician: Dr. Servando Snare   Reason for Consult:Confusion.   History obtained from:  Chart    HPI:                                                                                                                                         Monica [name] is an 82 y.o. female who presented initally with chest pain. She has a diagnosis of CAD and is currently 5 days post-op for CABG x 4. She has a history of CKD stage 3 presumed secondary to DM and HTN. Also with hyperlipidemia.   HLD  Past Medical History  Diagnosis Date  . Peptic ulcer   . Mass   . Essential hypertension   . Hyperlipidemia   . Type 2 diabetes mellitus ([place])   . Asthma   . History of blood transfusion   . CKD (chronic kidney disease), stage III     Past Surgical History  Procedure Laterality Date  . Abdominal hysterectomy    . Colon surgery    . Cardiac catheterization N/A 09/17/2015    Procedure: Left Heart Cath and Coronary Angiography;  Surgeon: Jettie Booze, MD;  Location: [facility];  Service: Cardiovascular;  Laterality: N/A;  . Coronary artery bypass graft N/A 09/19/2015    Procedure: CORONARY ARTERY BYPASS GRAFTING (CABG) TIMES FOUR USING BILATARAL SAPHENOUS VEIN GRAFTS AND LEFT INTERNAL MAMMARY ARTERY;  Surgeon: [name], MD;  Location: [facility];  Service: Open Heart Surgery;  Laterality: N/A;  . Tee without cardioversion N/A 09/19/2015    Procedure: TRANSESOPHAGEAL ECHOCARDIOGRAM (TEE);  Surgeon: [name], MD;  Location: [facility];  Service: Open Heart Surgery;  Laterality: N/A;    Family History  Problem Relation Age of Onset  . Heart attack Father     Social History:  reports that she has never smoked. She has quit using smokeless tobacco. Her smokeless tobacco use included Snuff. She reports that she does not drink alcohol or use illicit drugs.  No Known Allergies  MEDICATIONS:                                                                                                                      Medication list reviewed. Current medications  include ASA.   ROS:                                                                                                                                       Unable to obtain due to AMS.    Blood pressure 118/61, pulse 85, temperature 97.7 F (36.5 C), temperature source Oral, resp. rate 11, height 5\' 2"  (1.575 m), weight 85.1 kg (187 lb 9.8 oz), SpO2 100 %.   Neurologic Examination:                                                                                                      HEENT-  Normocephalic, no lesions, without obvious abnormality.  Normal external eye and conjunctiva.   Lungs- No gross wheezes.  Extremities- Warm and well-perfused.   Neurological Examination Mental Status:  Eyes open. Attends to verbal stimuli and answers simple questions. Unable to answer complex questions. Perseverates occasionally. Echolalia also noted. Poor attention. Poverty of speech. Localizes to sternal rub with L > R extremity. Obeys simple commands.   Cranial Nerves: II: Blinks to threat bilateral visual fields.  III,IV, VI: ptosis not present, extra-ocular motions intact bilaterally without nystagmus. Saccadic smooth pursuits noted.  V,VII: Mild right facial droop.  VIII: Responds to simple questions.  XI: bilateral shoulder shrug. Does not follow command. XII: Does not follow command.  Motor/Sensory: Unable to formally test due to poor comprehension. RUE drifts to bed sooner than left after passive elevation. RLE withdraws from noxious less forcefully than left.  Deep Tendon Reflexes: 2+ and symmetric throughout without clonus.  Plantars: Right: downgoing   Left: downgoing Cerebellar: Unable to test due to AMS.  Gait: Deferred due to falls risks.    Lab Results: Basic Metabolic Panel:  Recent Labs Lab 09/19/15 2130 09/20/15 0400  09/20/15 1630  09/21/15 1555  09/22/15 0547 09/22/15 1900 09/23/15 0421 09/24/15 0419  NA  --  140  < >  --   < > 137 139 137 137 138  K  --  4.3  < >  --   < > 5.2* 4.5 5.1 4.9 4.5  CL  --  107  < >  --   < > 102 102 103 102 102  CO2  --  22  --   --   < > 22 23 23 23 23   GLUCOSE  --  118*  < >  --   < > 314* 79 200* 158* 139*  BUN  --  20  < >  --   < > 37* 44* 51* 55* 64*  CREATININE 1.61* 1.98*  < > 2.63*  < > 4.04* 4.52* 4.60* 4.83* 4.20*  CALCIUM  --  7.1*  --   --   < > 6.8* 7.0* 6.9* 6.9* 7.3*  MG 2.1 2.0  --  2.1  --   --   --   --   --   --   < > = values in this interval not displayed.  Liver Function Tests:  Recent Labs Lab 09/23/15 0421 09/24/15 0419  AST 24 24  ALT 30 29  ALKPHOS 63 50  BILITOT 0.4 0.6  PROT 5.3* 5.7*  ALBUMIN 2.0* 2.1*   No results for input(s): LIPASE, AMYLASE in the last 168 hours. No results for input(s): AMMONIA in the last 168 hours.  CBC:  Recent Labs Lab 09/21/15 0422 09/21/15 1920 09/22/15 0547 09/23/15 0421 09/24/15 0419  WBC 14.1* 14.6* 14.5* 13.9* 11.9*  HGB 7.6* 8.6* 8.5* 8.1* 8.2*  HCT 23.8* 26.6* 26.3* 24.5* 25.3*  MCV 88.5 87.5 87.7 87.5 87.2  PLT 125* 130* 146* 188 228    Cardiac Enzymes: No results for input(s): CKTOTAL, CKMB, CKMBINDEX, TROPONINI in the last 168 hours.  Lipid Panel: No results for input(s): CHOL, TRIG, HDL, CHOLHDL, VLDL, LDLCALC in the last 168 hours.  CBG:  Recent Labs Lab 09/23/15 1939 09/24/15 0006 09/24/15 0406 09/24/15 0857 09/24/15 1225  GLUCAP 134* 110* 131* 110* 113*    Microbiology: Results for orders placed or performed during the hospital encounter of 09/14/15  Surgical pcr screen     Status: None   Collection Time: 09/18/15 11:09 PM  Result Value Ref Range Status   MRSA, PCR NEGATIVE NEGATIVE Final   Staphylococcus aureus NEGATIVE NEGATIVE Final    Comment:        The Xpert SA Assay (FDA approved for NASAL specimens in patients over 47 years of age), is one component of a comprehensive  surveillance program.  Test performance has been validated by [facility] for patients greater than or equal to 73 year old. It is not intended to diagnose infection nor to guide or monitor treatment.     Coagulation Studies: No results for input(s): LABPROT, INR in the last 72 hours.  Imaging: Ct Head Wo Contrast  09/24/2015  CLINICAL DATA:  Confusion, swallowing difficulty, status post CABG 5 days ago EXAM: CT HEAD WITHOUT CONTRAST TECHNIQUE: Contiguous axial images were obtained from the base of the skull through the vertex without intravenous contrast. COMPARISON:  None. FINDINGS: Motion degraded images. No evidence of parenchymal hemorrhage or extra-axial fluid collection. No mass lesion, mass effect, or midline shift. No CT evidence of acute infarction. Old left basal ganglia lacunar infarct. Subcortical white matter and periventricular small vessel ischemic changes. Intracranial atherosclerosis. Global cortical and central atrophy.  No ventriculomegaly. Partial opacification of the right ethmoid sinus. The mastoid air cells are unopacified. Right nasal tube. No evidence of calvarial fracture. IMPRESSION: No evidence of acute intracranial abnormality. Old left basal ganglia lacunar infarct. Atrophy with small vessel ischemic changes. Electronically Signed   By: Julian Hy M.D.   On: 09/24/2015 10:51   Dg Chest Port 1 View  09/24/2015  CLINICAL DATA:  Pulmonary atelectasis.  Shortness of breath today. EXAM: PORTABLE CHEST 1 VIEW COMPARISON:  09/23/2015 FINDINGS: Right internal jugular venous access sheath remains in place. There has been previous median sternotomy and CABG. There continues to be left lower lobe  collapse with a small amount of pleural fluid on the left. Mild volume loss at the right base. Possible pulmonary venous hypertension. IMPRESSION: No change.  Persistent left lower lobe collapse and left effusion. Electronically Signed   By: Nelson Chimes M.D.   On: 09/24/2015  07:38   Dg Chest Port 1 View  09/23/2015  CLINICAL DATA:  Atelectasis EXAM: PORTABLE CHEST 1 VIEW COMPARISON:  Chest radiograph from one day prior. FINDINGS: Right internal jugular central venous sheath terminates in the right brachiocephalic vein. Sternotomy wires appear aligned and intact. Stable cardiomediastinal silhouette with mild-to-moderate cardiomegaly. No pneumothorax. Stable small left pleural effusion. Mild-to-moderate pulmonary edema is not appreciably changed. Left greater than right lung base opacities, slightly decreased on the right and stable on the left. IMPRESSION: 1. Stable mild-to-moderate congestive heart failure. 2. Stable small left pleural effusion. 3. Bibasilar lung opacities, left greater than right, slightly improved on the right and stable on the left, favor atelectasis. Electronically Signed   By: Ilona Sorrel M.D.   On: 09/23/2015 08:09   Dg Abd Portable 1v  09/24/2015  CLINICAL DATA:  Feeding tube placement EXAM: PORTABLE ABDOMEN - 1 VIEW COMPARISON:  Nine/ 19/15 FINDINGS: There is normal small bowel gas pattern. The patient is status post CABG. There is and she feeding tube with tip in and distal stomach. IMPRESSION: NG feeding tube with tip in distal stomach. Electronically Signed   By: Lahoma Crocker M.D.   On: 09/24/2015 11:12   Assessment:  1. AMS. Given right sided weakness on exam, DDx includes new left cerebral hemisphere stroke. Also on DDx would be pre-existing weakness with worsened neurological deficit due to metabolic encephalopathy. CT head reveals no evidence of acute intracranial abnormality, with old left basal ganglia lacunar infarct noted. Also seen on CT is atrophy with small vessel ischemic changes. 2. Acute oliguric renal failure on CKD stage 3. Current worsening of BUN/Cr increases likelihood of metabolic encephalopathy as a contributing factor regarding her confusion.    Plan:  1. MRI brain.  2. Continue ASA.  3. EEG to assess for possible triphasic  waves.  4. Monitor electrolytes and correct as indicated.  5. TSH, ammonia, RPR.   Kerney Elbe, MD 09/24/2015, 1:24 PM

## 2015-09-24 NOTE — Progress Notes (Signed)
Initial Nutrition Assessment  DOCUMENTATION CODES:   Obesity unspecified  INTERVENTION:   - Initiate enteral nutrition with Nepro via NG tube at 10 ml/hr to increase by 10 ml every 4 hours to a goal rate of 35 ml/hr. - Provide 30 ml Prostat TID, each supplement provides 100 kcals and 15 grams of protein.  Tube feeding and prostat regimen provides 1812 kcals, 113 grams of protein, and 611 ml of water.   NUTRITION DIAGNOSIS:   Inadequate oral intake related to inability to eat as evidenced by NPO status.  GOAL:   Patient will meet greater than or equal to 90% of their needs  MONITOR:   TF tolerance, Weight trends, Labs, I & O's, Skin  REASON FOR ASSESSMENT:   Consult Enteral/tube feeding initiation and management  ASSESSMENT:   82 y.o. female with PMH of HTN, hyperlipidemia, DM, asthma, GERD, gout, chronic kidney disease-stage III, peptic ulcer disease, who presents with chest pain.  Pt reports that she has been having intermittent chest pain for about 2 months. The chest pain happens 2 or 3 times per week for 20-30 mins each time. She states that her chest pain is exertional, walking make her chest pain worse. Patient does not have abdominal pain, diarrhea, symptoms of UTI, nausea, vomiting, unilateral weakness.    3/15 - CABG x4 and Transesophageal exhocardiogram 3/19 - SLP eval; patient unable to follow commands; patient presented with oropharyngeal dysphagia characterized by delayed oral transit and delayed swallow trigger.  Recommend keeping patient NPO pending reassessment tomorrow.   3/20 - Per SLP note, RN stated that patient is not constantly alert for PO intake.  RD consulted for tube feeding initiation and management.   Patient had a Cortrak tube placed this morning, tip located in the distal stomach.  Pt reports that she was eating about 2 meals and some snacks each day PTA.  States that her weight has remained stable, verified upon chart review.  Nutrition Focused  Physical Exam was conducted.  Findings include no fat depletion, no muscle depletion, and no edema.  Medications reviewed: novolog, protonix.  Labs reviewed: CBGs (110-131).  Diet Order:  Diet NPO time specified  Skin:  Wound (see comment) (closed chest and R and L leg incision)  Last BM:  3/19  Height:   Ht Readings from Last 1 Encounters:  09/19/15 5\' 2"  (1.575 m)    Weight:   Wt Readings from Last 1 Encounters:  09/24/15 187 lb 9.8 oz (85.1 kg)    Ideal Body Weight:  50 kg  BMI:  Body mass index is 34.31 kg/(m^2).  Estimated Nutritional Needs:   Kcal:  1600-1800  Protein:  95-105 grams  Fluid:  per MD  EDUCATION NEEDS:   No education needs identified at this time  Veronda Prude, Dietetic Intern Pager: [phone]

## 2015-09-24 NOTE — Progress Notes (Signed)
Physical Therapy Treatment Patient Details Name: Monica [name] MRN: [id number] DOB: [birth year] Today's Date: 09/24/2015    History of Present Illness Monica [name] is a 82 y.o. female with PMH of hypertension, hyperlipidemia, diabetes mellitus, asthma, GERD, gout, chronic kidney disease-stage III, peptic ulcer disease, who presents with chest pain. For cardiac cath 09/17/2015; underwent CABG x 4 on 09/19/15    PT Comments    Patient lethargic today and has difficulty sustaining attention. Increased time to follow simple 1 step commands. Tolerated standing, marching and taking a few steps along side bed with min A for balance/safety. Deferred transfer to chair as pt going down for PICC line. Continues to demonstrate cognitive deficits and decreased arousal. Will continue to follow per current POC.   Follow Up Recommendations  SNF     Equipment Recommendations  Other (comment) (TBA)    Recommendations for Other Services       Precautions / Restrictions Precautions Precautions: Fall;Sternal Restrictions Weight Bearing Restrictions: No    Mobility  Bed Mobility Overal bed mobility: Needs Assistance Bed Mobility: Supine to Sit;Sit to Supine     Supine to sit: Max assist;+2 for physical assistance;HOB elevated Sit to supine: Max assist;+2 for physical assistance   General bed mobility comments: Assist for BLEs, trunk and to scoot bottom to EOB. Assist to return to supine.  Transfers Overall transfer level: Needs assistance Equipment used: 2 person hand held assist Transfers: Sit to/from Stand Sit to Stand: Mod assist;+2 physical assistance         General transfer comment: Assist to stand fromE OB x2 with cues for hand placement/technique. Use of body momentum.   Ambulation/Gait Ambulation/Gait assistance: Min assist Ambulation Distance (Feet): 4 Feet Assistive device: 2 person hand held assist (holding onto chair handles) Gait Pattern/deviations: Decreased stride  length;Trunk flexed;Step-to pattern Gait velocity: decreased   General Gait Details: Pt able to side step along side bed ~4 feet x2 with cues for advancing LEs. Increased time.   Stairs            Wheelchair Mobility    Modified Rankin (Stroke Patients Only)       Balance Overall balance assessment: Needs assistance Sitting-balance support: Feet supported;Bilateral upper extremity supported Sitting balance-Leahy Scale: Poor Sitting balance - Comments: Requires BUE support sitting EOB. Min A progressing to min guard.    Standing balance support: During functional activity;Bilateral upper extremity supported Standing balance-Leahy Scale: Poor Standing balance comment: UE support for balance. Marching in standing x5.                    Cognition Arousal/Alertness: Lethargic Behavior During Therapy: Flat affect Overall Cognitive Status: Impaired/Different from baseline   Orientation Level: Disoriented to;Place;Situation;Time     Following Commands: Follows one step commands with increased time     Problem Solving: Slow processing;Decreased initiation;Requires verbal cues General Comments: Difficulty sustaining attention.    Exercises      General Comments        Pertinent Vitals/Pain Pain Assessment: Faces Faces Pain Scale: No hurt    Home Living                      Prior Function            PT Goals (current goals can now be found in the care plan section) Progress towards PT goals: Progressing toward goals    Frequency  Min 3X/week    PT Plan Current plan remains appropriate  Co-evaluation             End of Session Equipment Utilized During Treatment: Gait belt Activity Tolerance: Patient limited by lethargy Patient left: in bed;with call bell/phone within reach;with bed alarm set     Time: [id number] PT Time Calculation (min) (ACUTE ONLY): 23 min  Charges:  $Therapeutic Activity: 23-37 mins                     G Codes:      Jovanna Hodges A Taneasha Fuqua 09/24/2015, 2:24 PM Wray Kearns, [place], DPT [phone]

## 2015-09-24 NOTE — Progress Notes (Addendum)
Patient ID: Monica [name], female   DOB: Dec 11, 1930, 82 y.o.   MRN: [id number] TCTS DAILY ICU PROGRESS NOTE                   [address]            [place] [address]          [phone]   5 Days Post-Op Procedure(s) (LRB): CORONARY ARTERY BYPASS GRAFTING (CABG) TIMES FOUR USING BILATARAL SAPHENOUS VEIN GRAFTS AND LEFT INTERNAL MAMMARY ARTERY (N/A) TRANSESOPHAGEAL ECHOCARDIOGRAM (TEE) (N/A)  Total Length of Stay:  LOS: 5 days   Subjective: Not following commands says a few words  Objective: Vital signs in last 24 hours: Temp:  [97.9 F (36.6 C)-98.7 F (37.1 C)] 98.2 F (36.8 C) (03/20 0409) Pulse Rate:  [80-89] 81 (03/20 0700) Cardiac Rhythm:  [-] Normal sinus rhythm (03/20 0400) Resp:  [10-24] 10 (03/20 0700) BP: (94-139)/(45-81) 106/64 mmHg (03/20 0700) SpO2:  [98 %-100 %] 98 % (03/20 0700) Weight:  [187 lb 9.8 oz (85.1 kg)] 187 lb 9.8 oz (85.1 kg) (03/20 0413)  Filed Weights   09/22/15 0500 09/23/15 0411 09/24/15 0413  Weight: 197 lb 8.5 oz (89.6 kg) 192 lb 0.3 oz (87.1 kg) 187 lb 9.8 oz (85.1 kg)    Weight change: -4 lb 6.6 oz (-2 kg)   Hemodynamic parameters for last 24 hours:    Intake/Output from previous day: 03/19 0701 - 03/20 0700 In: 99.7 [I.V.:99.7] Out: 2790 [Urine:2790]  Intake/Output this shift:    Current Meds: Scheduled Meds: . antiseptic oral rinse  7 mL Mouth Rinse BID  . antiseptic oral rinse  7 mL Mouth Rinse QID  . aspirin EC  325 mg Oral Daily   Or  . aspirin  324 mg Per Tube Daily  . bisacodyl  10 mg Rectal Daily  . chlorhexidine gluconate  15 mL Mouth Rinse BID  . enoxaparin (LOVENOX) injection  30 mg Subcutaneous QHS  . insulin aspart  0-24 Units Subcutaneous 6 times per day  . levalbuterol  0.63 mg Nebulization TID  . metoprolol  5 mg Intravenous 4 times per day  . pantoprazole (PROTONIX) IV  40 mg Intravenous Q24H  . sodium chloride flush  3 mL Intravenous Q12H   Continuous Infusions: . sodium chloride 250  mL (09/20/15 0606)  . DOPamine 3 mcg/kg/min (09/23/15 1900)   PRN Meds:.metoprolol, ondansetron (ZOFRAN) IV, sodium chloride flush  General appearance: no distress, slowed mentation, uncooperative and not following commands,  Neurologic: moves all extremities  Heart: regular rate and rhythm, S1, S2 normal, no murmur, click, rub or gallop Lungs: diminished breath sounds bibasilar Abdomen: soft, non-tender; bowel sounds normal; no masses,  no organomegaly Extremities: extremities normal, atraumatic, no cyanosis or edema and Homans sign is negative, no sign of DVT Wound: sternum stable  Lab Results: CBC: Recent Labs  09/23/15 0421 09/24/15 0419  WBC 13.9* 11.9*  HGB 8.1* 8.2*  HCT 24.5* 25.3*  PLT 188 228   BMET:  Recent Labs  09/23/15 0421 09/24/15 0419  NA 137 138  K 4.9 4.5  CL 102 102  CO2 23 23  GLUCOSE 158* 139*  BUN 55* 64*  CREATININE 4.83* 4.20*  CALCIUM 6.9* 7.3*    PT/INR: No results for input(s): LABPROT, INR in the last 72 hours. Radiology: No results found.   Assessment/Plan: S/P Procedure(s) (LRB): CORONARY ARTERY BYPASS GRAFTING (CABG) TIMES FOUR USING BILATARAL SAPHENOUS VEIN GRAFTS AND LEFT INTERNAL MAMMARY ARTERY (N/A)  TRANSESOPHAGEAL ECHOCARDIOGRAM (TEE) (N/A) Ct or head non contrast Feeding panda tube  Ir place stable central line /midline cath- clear with renal type of line suitable with them Stroke vs metabolic encephalopathy- consult neurology     [name] 09/24/2015 7:27 AM

## 2015-09-24 NOTE — Progress Notes (Signed)
Patient ID: Monica [name], female   DOB: 03/16/1931, 82 y.o.   MRN: [id number]  [facility] Progress Note   Assessment/ Plan:   1. AKI on CKD stage III: Improving urine output with good response to furosemide overnight-creatinine appears to be improving indicating probable trajectory towards renal recovery after plateau phase of ATN. No acute indications for hemodialysis noted at this time. We'll continue to follow closely as we support blood pressure and minimize nephrotoxin exposure to allow for renal recovery. 2. S/P 4 Vessel CABG: Doing well from cardiothoracic surgery standpoint-we'll continue low-dose dopamine for the next 24 hours and then discontinued especially urine output remained sustained. 3. Anemia: Secondary to recent surgery/critical illness, will check iron studies today and decide on need for IV iron/ESA. 4. Altered mental status: Remains confused, plans in place for feeding tube placement as she is unlikely to pass her swallow evaluation. Suspected to be metabolic encephalopathy, CT scan of head pending to assess for CVA.  Subjective:   Confused intermittently overnight-excellent urine output noted overnight in response to Lasix    Objective:   BP 106/64 mmHg  Pulse 81  Temp(Src) 98.2 F (36.8 C) (Oral)  Resp 10  Ht 5\' 2"  (1.575 m)  Wt 85.1 kg (187 lb 9.8 oz)  BMI 34.31 kg/m2  SpO2 98%  Intake/Output Summary (Last 24 hours) at 09/24/15 [id number] Last data filed at 09/24/15 0700  Gross per 24 hour  Intake     95 ml  Output   2665 ml  Net  -2570 ml   Weight change: -2 kg (-4 lb 6.6 oz)  Physical Exam: [id number] resting in bed-disoriented CVS: Pulse regular in rate and rhythm, S1 and S2 normal Resp: Coarse breath sounds-no distinct rales Abd: Soft, obese, nontender Ext: Trace lower extremity edema  Imaging: Dg Chest Port 1 View  09/24/2015  CLINICAL DATA:  Pulmonary atelectasis.  Shortness of breath today. EXAM: PORTABLE CHEST 1 VIEW COMPARISON:   09/23/2015 FINDINGS: Right internal jugular venous access sheath remains in place. There has been previous median sternotomy and CABG. There continues to be left lower lobe collapse with a small amount of pleural fluid on the left. Mild volume loss at the right base. Possible pulmonary venous hypertension. IMPRESSION: No change.  Persistent left lower lobe collapse and left effusion. Electronically Signed   By: Nelson Chimes M.D.   On: 09/24/2015 07:38   Dg Chest Port 1 View  09/23/2015  CLINICAL DATA:  Atelectasis EXAM: PORTABLE CHEST 1 VIEW COMPARISON:  Chest radiograph from one day prior. FINDINGS: Right internal jugular central venous sheath terminates in the right brachiocephalic vein. Sternotomy wires appear aligned and intact. Stable cardiomediastinal silhouette with mild-to-moderate cardiomegaly. No pneumothorax. Stable small left pleural effusion. Mild-to-moderate pulmonary edema is not appreciably changed. Left greater than right lung base opacities, slightly decreased on the right and stable on the left. IMPRESSION: 1. Stable mild-to-moderate congestive heart failure. 2. Stable small left pleural effusion. 3. Bibasilar lung opacities, left greater than right, slightly improved on the right and stable on the left, favor atelectasis. Electronically Signed   By: Ilona Sorrel M.D.   On: 09/23/2015 08:09    Labs: BMET  Recent Labs Lab 09/20/15 0400 09/20/15 1625 09/20/15 1630 09/21/15 0422 09/21/15 1555 09/22/15 0547 09/22/15 1900 09/23/15 0421 09/24/15 0419  NA 140 139  --  139 137 139 137 137 138  K 4.3 4.7  --  4.8 5.2* 4.5 5.1 4.9 4.5  CL 107 102  --  106  102 102 103 102 102  CO2 22  --   --  23 22 23 23 23 23   GLUCOSE 118* 118*  --  132* 314* 79 200* 158* 139*  BUN 20 26*  --  30* 37* 44* 51* 55* 64*  CREATININE 1.98* 2.60* 2.63* 3.34* 4.04* 4.52* 4.60* 4.83* 4.20*  CALCIUM 7.1*  --   --  7.0* 6.8* 7.0* 6.9* 6.9* 7.3*   CBC  Recent Labs Lab 09/21/15 1920 09/22/15 0547  09/23/15 0421 09/24/15 0419  WBC 14.6* 14.5* 13.9* 11.9*  HGB 8.6* 8.5* 8.1* 8.2*  HCT 26.6* 26.3* 24.5* 25.3*  MCV 87.5 87.7 87.5 87.2  PLT 130* 146* 188 228    Medications:    . antiseptic oral rinse  7 mL Mouth Rinse BID  . antiseptic oral rinse  7 mL Mouth Rinse QID  . aspirin EC  325 mg Oral Daily   Or  . aspirin  324 mg Per Tube Daily  . bisacodyl  10 mg Rectal Daily  . chlorhexidine gluconate  15 mL Mouth Rinse BID  . enoxaparin (LOVENOX) injection  30 mg Subcutaneous QHS  . insulin aspart  0-24 Units Subcutaneous 6 times per day  . levalbuterol  0.63 mg Nebulization TID  . metoprolol  5 mg Intravenous 4 times per day  . pantoprazole (PROTONIX) IV  40 mg Intravenous Q24H  . sodium chloride flush  3 mL Intravenous Q12H      Elmarie Shiley, MD 09/24/2015, 8:23 AM

## 2015-09-24 NOTE — Procedures (Signed)
L IJ tunneled CVC placed No complication No blood loss. See complete dictation in [facility].

## 2015-09-24 NOTE — Progress Notes (Signed)
SLP Cancellation Note  Patient Details Name: Monica [name] MRN: [id number] DOB: [birth year]   Cancelled treatment:       Reason Eval/Treat Not Completed: Patient's level of consciousness. Per RN pt is not consistently alert for PO intake, MD already planning for NG tube for feeding, RN asked SLP to hold therapy until condition improves for better readiness. Will return tomorrow for further trials.    Maleek Craver, Katherene Ponto 09/24/2015, 9:11 AM

## 2015-09-24 NOTE — Progress Notes (Signed)
Patient ID: [name], female   DOB: [birth year], 82 y.o.   MRN: [id number] EVENING ROUNDS NOTE :     [address]       [place] [address]             [phone]                 5 Days Post-Op Procedure(s) (LRB): CORONARY ARTERY BYPASS GRAFTING (CABG) TIMES FOUR USING BILATARAL SAPHENOUS VEIN GRAFTS AND LEFT INTERNAL MAMMARY ARTERY (N/A) TRANSESOPHAGEAL ECHOCARDIOGRAM (TEE) (N/A)  Total Length of Stay:  LOS: 5 days  BP 150/60 mmHg  Pulse 88  Temp(Src) 97.6 F (36.4 C) (Oral)  Resp 16  Ht 5\' 2"  (1.575 m)  Wt 187 lb 9.8 oz (85.1 kg)  BMI 34.31 kg/m2  SpO2 99%  .Intake/Output      03/19 0701 - 03/20 0700 03/20 0701 - 03/21 0700   I.V. (mL/kg) 99.7 (1.2) 47 (0.6)   Total Intake(mL/kg) 99.7 (1.2) 47 (0.6)   Urine (mL/kg/hr) 2790 (1.4) 1050 (1.2)   Stool     Total Output 2790 1050   Net -2690.3 -1003          . sodium chloride 250 mL (09/20/15 0606)  . DOPamine 3 mcg/kg/min (09/24/15 1700)     Lab Results  Component Value Date   WBC 11.9* 09/24/2015   HGB 8.2* 09/24/2015   HCT 25.3* 09/24/2015   PLT 228 09/24/2015   GLUCOSE 139* 09/24/2015   CHOL 139 09/15/2015   TRIG 119 09/15/2015   HDL 35* 09/15/2015   LDLCALC 80 09/15/2015   ALT 29 09/24/2015   AST 24 09/24/2015   NA 138 09/24/2015   K 4.5 09/24/2015   CL 102 09/24/2015   CREATININE 4.20* 09/24/2015   BUN 64* 09/24/2015   CO2 23 09/24/2015   INR 1.53* 09/19/2015   HGBA1C 8.2* 09/15/2015   New lines in place, on tube feeding  Talking better this evening   [name] MD  Beeper [phone] Office [phone] 09/24/2015 5:44 PM

## 2015-09-25 ENCOUNTER — Inpatient Hospital Stay (HOSPITAL_COMMUNITY): Payer: Medicare HMO

## 2015-09-25 DIAGNOSIS — R4182 Altered mental status, unspecified: Secondary | ICD-10-CM

## 2015-09-25 LAB — COMPREHENSIVE METABOLIC PANEL
ALT: 44 U/L (ref 14–54)
AST: 58 U/L — ABNORMAL HIGH (ref 15–41)
Albumin: 2 g/dL — ABNORMAL LOW (ref 3.5–5.0)
Alkaline Phosphatase: 68 U/L (ref 38–126)
Anion gap: 15 (ref 5–15)
BUN: 74 mg/dL — ABNORMAL HIGH (ref 6–20)
CO2: 26 mmol/L (ref 22–32)
Calcium: 7.6 mg/dL — ABNORMAL LOW (ref 8.9–10.3)
Chloride: 103 mmol/L (ref 101–111)
Creatinine, Ser: 3.42 mg/dL — ABNORMAL HIGH (ref 0.44–1.00)
GFR calc Af Amer: 13 mL/min — ABNORMAL LOW (ref >60)
GFR calc non Af Amer: 11 mL/min — ABNORMAL LOW (ref >60)
Glucose, Bld: 224 mg/dL — ABNORMAL HIGH (ref 65–99)
Potassium: 4.1 mmol/L (ref 3.5–5.1)
Sodium: 144 mmol/L (ref 135–145)
Total Bilirubin: 0.4 mg/dL (ref 0.3–1.2)
Total Protein: 5.7 g/dL — ABNORMAL LOW (ref 6.5–8.1)

## 2015-09-25 LAB — TSH: TSH: 2.184 u[IU]/mL (ref 0.350–4.500)

## 2015-09-25 LAB — GLUCOSE, CAPILLARY
GLUCOSE-CAPILLARY: 138 mg/dL — AB (ref 65–99)
GLUCOSE-CAPILLARY: 197 mg/dL — AB (ref 65–99)
GLUCOSE-CAPILLARY: 213 mg/dL — AB (ref 65–99)
GLUCOSE-CAPILLARY: 217 mg/dL — AB (ref 65–99)
GLUCOSE-CAPILLARY: 238 mg/dL — AB (ref 65–99)
Glucose-Capillary: 122 mg/dL — ABNORMAL HIGH (ref 65–99)
Glucose-Capillary: 166 mg/dL — ABNORMAL HIGH (ref 65–99)

## 2015-09-25 LAB — CBC
HCT: 24.3 % — ABNORMAL LOW (ref 36.0–46.0)
Hemoglobin: 7.8 g/dL — ABNORMAL LOW (ref 12.0–15.0)
MCH: 27.9 pg (ref 26.0–34.0)
MCHC: 32.1 g/dL (ref 30.0–36.0)
MCV: 86.8 fL (ref 78.0–100.0)
Platelets: 271 10*3/uL (ref 150–400)
RBC: 2.8 MIL/uL — ABNORMAL LOW (ref 3.87–5.11)
RDW: 14.5 % (ref 11.5–15.5)
WBC: 8.8 10*3/uL (ref 4.0–10.5)

## 2015-09-25 LAB — AMMONIA: Ammonia: 25 umol/L (ref 9–35)

## 2015-09-25 MED ORDER — FERUMOXYTOL INJECTION 510 MG/17 ML
510.0000 mg | INTRAVENOUS | Status: AC
  Administered 2015-09-25 – 2015-09-28 (×2): 510 mg via INTRAVENOUS
  Filled 2015-09-25 (×3): qty 17

## 2015-09-25 MED ORDER — RESOURCE THICKENUP CLEAR PO POWD
ORAL | Status: DC | PRN
  Filled 2015-09-25: qty 125

## 2015-09-25 MED ORDER — FUROSEMIDE 10 MG/ML IJ SOLN
40.0000 mg | Freq: Two times a day (BID) | INTRAMUSCULAR | Status: DC
  Administered 2015-09-25 – 2015-09-26 (×3): 40 mg via INTRAVENOUS
  Filled 2015-09-25 (×3): qty 4

## 2015-09-25 MED ORDER — METOPROLOL TARTRATE 25 MG/10 ML ORAL SUSPENSION
12.5000 mg | Freq: Two times a day (BID) | ORAL | Status: DC
  Administered 2015-09-25 – 2015-09-26 (×3): 12.5 mg
  Filled 2015-09-25 (×3): qty 5

## 2015-09-25 NOTE — Care Management Important Message (Signed)
Important Message  Patient Details  Name: Monica [name] MRN: [id number] Date of Birth: 1931/04/24   Medicare Important Message Given:  Yes    Jancarlos Thrun P Lexani Corona 09/25/2015, 11:56 AM

## 2015-09-25 NOTE — Progress Notes (Signed)
TCTS BRIEF SICU PROGRESS NOTE  6 Days Post-Op  S/P Procedure(s) (LRB): CORONARY ARTERY BYPASS GRAFTING (CABG) TIMES FOUR USING BILATARAL SAPHENOUS VEIN GRAFTS AND LEFT INTERNAL MAMMARY ARTERY (N/A) TRANSESOPHAGEAL ECHOCARDIOGRAM (TEE) (N/A)   Stable day Breathing comfortably w/ O2 sats 100% on 1 L/min UOP 60-100 mL/hr  Plan: Continue current plan  Rexene Alberts, MD 09/25/2015 7:13 PM

## 2015-09-25 NOTE — Progress Notes (Signed)
We have ordered TSH, Ammonia, RPR and EEG. Awaiting results. Primary team to order MRI if patient capable of having this diagnostic test.   Etta Quill PA-C [facility] [phone]  09/25/2015, 9:35 AM

## 2015-09-25 NOTE — Progress Notes (Signed)
Patient ID: Monica [name], female   DOB: 12/06/1930, 82 y.o.   MRN: [id number]  [facility] Progress Note   Assessment/ Plan:   1. AKI on CKD stage III: Good UOP and will re-dose furosemide to maintain UOP as we stop dopamine-creatinine appears to be improving indicating likely renal recovery after plateau phase of ATN. No acute indications for hemodialysis noted at this time. We'll continue to follow closely as we support blood pressure and minimize nephrotoxin exposure to allow for renal recovery. 2. S/P 4 Vessel CABG: Doing well from cardiothoracic surgery standpoint. 3. Anemia: Secondary to recent surgery/critical illness, will check iron studies today and decide on need for IV iron/ESA. 4. Altered mental status: Remains confused, plans in place for feeding tube placement as she is unlikely to pass her swallow evaluation. Suspected to be metabolic encephalopathy, CT scan of head showed old CVA and microvascular injury  Subjective:   Remains confused overnight, now on tube feeds.    Objective:   BP 138/62 mmHg  Pulse 88  Temp(Src) 98.2 F (36.8 C) (Axillary)  Resp 15  Ht 5\' 2"  (1.575 m)  Wt 83.5 kg (184 lb 1.4 oz)  BMI 33.66 kg/m2  SpO2 100%  Intake/Output Summary (Last 24 hours) at 09/25/15 0838 Last data filed at 09/25/15 0700  Gross per 24 hour  Intake  481.1 ml  Output   1800 ml  Net -1318.9 ml   Weight change: -1.6 kg (-3 lb 8.4 oz)  Physical Exam: Gen: Appears confused and intermittently moaning in bed.  CVS: Pulse regular in rate and rhythm, S1 and S2 normal Resp: Coarse breath sounds-no distinct rales Abd: Soft, obese, nontender Ext: Trace lower extremity edema  Imaging: Ct Head Wo Contrast  09/24/2015  CLINICAL DATA:  Confusion, swallowing difficulty, status post CABG 5 days ago EXAM: CT HEAD WITHOUT CONTRAST TECHNIQUE: Contiguous axial images were obtained from the base of the skull through the vertex without intravenous contrast. COMPARISON:  None.  FINDINGS: Motion degraded images. No evidence of parenchymal hemorrhage or extra-axial fluid collection. No mass lesion, mass effect, or midline shift. No CT evidence of acute infarction. Old left basal ganglia lacunar infarct. Subcortical white matter and periventricular small vessel ischemic changes. Intracranial atherosclerosis. Global cortical and central atrophy.  No ventriculomegaly. Partial opacification of the right ethmoid sinus. The mastoid air cells are unopacified. Right nasal tube. No evidence of calvarial fracture. IMPRESSION: No evidence of acute intracranial abnormality. Old left basal ganglia lacunar infarct. Atrophy with small vessel ischemic changes. Electronically Signed   By: Julian Hy M.D.   On: 09/24/2015 10:51   Ir US Guide Vasc Access Left  09/24/2015  CLINICAL DATA:  Altered mental status, renal failure, needs venous access for infusion therapy EXAM: TUNNELED CENTRAL VENOUS CATHETER PLACEMENT WITH ULTRASOUND AND FLUOROSCOPIC GUIDANCE TECHNIQUE: The procedure, risks, benefits, and alternatives were explained to the family. Questions regarding the procedure were encouraged and answered. The family understands and consents to the procedure. An appropriate skin site was determined. Region was prepped using maximum barrier technique including cap and mask, sterile gown, sterile gloves, large sterile sheet, and Chlorhexidine as cutaneous antisepsis. The region was infiltrated locally with 1% lidocaine. Left IJ approach was selected because of an indwelling right jugular catheter. Under real-time ultrasound guidance, the left IJ vein was accessed with a 21 gauge micropuncture needle; the needle tip within the vein was confirmed with ultrasound image documentation. 2F dual-lumen cuffed powerPICC tunneled from a righ left t anterior chest wall approach  to the dermatotomy site. Needle exchanged over the 018 guidewire for transitional dilator, through which the catheter which had been cut  to 28 cm was advanced under intermittent fluoroscopy, positioned with its tip at the cavoatrial junction. Spot chest radiograph confirms good catheter position. No pneumothorax. Catheter was flushed per protocol. Catheter secured externally with O Prolene suture. The left IJ dermatotomy site was closed with Dermabond. COMPLICATIONS: COMPLICATIONS None immediate FLUOROSCOPY TIME:  30 seconds, 3 mGy COMPARISON:  None IMPRESSION: 1. Technically successful placement of tunneled left IJ tunneled dual-lumen power injectable catheter with ultrasound and fluoroscopic guidance. Ready for routine use. Electronically Signed   By: Lucrezia Europe M.D.   On: 09/24/2015 15:16   Dg Chest Port 1 View  09/25/2015  CLINICAL DATA:  Shortness of breath, history of asthma, Coronary artery disease, previous CABG, diabetes. EXAM: PORTABLE CHEST 1 VIEW COMPARISON:  Portable chest x-ray of September 24, 2015 FINDINGS: The lungs are borderline hypoinflated. The pulmonary interstitial markings remain increased. The left hemidiaphragm remains obscured. The cardiac silhouette remains enlarged and the pulmonary vascularity engorged. The feeding tube tip projects below the inferior margin of the image. The left internal jugular venous catheter tip projects over the proximal SVC. The bony thorax exhibits no acute abnormality. IMPRESSION: CHF with pulmonary interstitial edema with stable left basilar atelectasis and small effusion. No significant change since yesterday's study. Electronically Signed   By: David  Martinique M.D.   On: 09/25/2015 08:01   Dg Chest Port 1 View  09/24/2015  CLINICAL DATA:  Pulmonary atelectasis.  Shortness of breath today. EXAM: PORTABLE CHEST 1 VIEW COMPARISON:  09/23/2015 FINDINGS: Right internal jugular venous access sheath remains in place. There has been previous median sternotomy and CABG. There continues to be left lower lobe collapse with a small amount of pleural fluid on the left. Mild volume loss at the right base.  Possible pulmonary venous hypertension. IMPRESSION: No change.  Persistent left lower lobe collapse and left effusion. Electronically Signed   By: Nelson Chimes M.D.   On: 09/24/2015 07:38   Dg Abd Portable 1v  09/24/2015  CLINICAL DATA:  Feeding tube placement EXAM: PORTABLE ABDOMEN - 1 VIEW COMPARISON:  Nine/ 19/15 FINDINGS: There is normal small bowel gas pattern. The patient is status post CABG. There is and she feeding tube with tip in and distal stomach. IMPRESSION: NG feeding tube with tip in distal stomach. Electronically Signed   By: Lahoma Crocker M.D.   On: 09/24/2015 11:12   Ir Fluoro Guide Cv Midline Picc Left  09/24/2015  CLINICAL DATA:  Altered mental status, renal failure, needs venous access for infusion therapy EXAM: TUNNELED CENTRAL VENOUS CATHETER PLACEMENT WITH ULTRASOUND AND FLUOROSCOPIC GUIDANCE TECHNIQUE: The procedure, risks, benefits, and alternatives were explained to the family. Questions regarding the procedure were encouraged and answered. The family understands and consents to the procedure. An appropriate skin site was determined. Region was prepped using maximum barrier technique including cap and mask, sterile gown, sterile gloves, large sterile sheet, and Chlorhexidine as cutaneous antisepsis. The region was infiltrated locally with 1% lidocaine. Left IJ approach was selected because of an indwelling right jugular catheter. Under real-time ultrasound guidance, the left IJ vein was accessed with a 21 gauge micropuncture needle; the needle tip within the vein was confirmed with ultrasound image documentation. 87F dual-lumen cuffed powerPICC tunneled from a righ left t anterior chest wall approach to the dermatotomy site. Needle exchanged over the 018 guidewire for transitional dilator, through which the catheter which  had been cut to 28 cm was advanced under intermittent fluoroscopy, positioned with its tip at the cavoatrial junction. Spot chest radiograph confirms good catheter  position. No pneumothorax. Catheter was flushed per protocol. Catheter secured externally with O Prolene suture. The left IJ dermatotomy site was closed with Dermabond. COMPLICATIONS: COMPLICATIONS None immediate FLUOROSCOPY TIME:  30 seconds, 3 mGy COMPARISON:  None IMPRESSION: 1. Technically successful placement of tunneled left IJ tunneled dual-lumen power injectable catheter with ultrasound and fluoroscopic guidance. Ready for routine use. Electronically Signed   By: Lucrezia Europe M.D.   On: 09/24/2015 15:16    Labs: BMET  Recent Labs Lab 09/21/15 0422 09/21/15 1555 09/22/15 0547 09/22/15 1900 09/23/15 0421 09/24/15 0419 09/25/15 0417  NA 139 137 139 137 137 138 144  K 4.8 5.2* 4.5 5.1 4.9 4.5 4.1  CL 106 102 102 103 102 102 103  CO2 23 22 23 23 23 23 26   GLUCOSE 132* 314* 79 200* 158* 139* 224*  BUN 30* 37* 44* 51* 55* 64* 74*  CREATININE 3.34* 4.04* 4.52* 4.60* 4.83* 4.20* 3.42*  CALCIUM 7.0* 6.8* 7.0* 6.9* 6.9* 7.3* 7.6*   CBC  Recent Labs Lab 09/22/15 0547 09/23/15 0421 09/24/15 0419 09/25/15 0417  WBC 14.5* 13.9* 11.9* 8.8  HGB 8.5* 8.1* 8.2* 7.8*  HCT 26.3* 24.5* 25.3* 24.3*  MCV 87.7 87.5 87.2 86.8  PLT 146* 188 228 271    Medications:    . antiseptic oral rinse  7 mL Mouth Rinse BID  . antiseptic oral rinse  7 mL Mouth Rinse QID  . aspirin EC  325 mg Oral Daily   Or  . aspirin  324 mg Per Tube Daily  . bisacodyl  10 mg Rectal Daily  . chlorhexidine gluconate  15 mL Mouth Rinse BID  . enoxaparin (LOVENOX) injection  30 mg Subcutaneous QHS  . feeding supplement (NEPRO CARB STEADY)  1,000 mL Per Tube Q24H  . feeding supplement (PRO-STAT SUGAR FREE 64)  30 mL Per Tube TID  . insulin aspart  0-24 Units Subcutaneous 6 times per day  . levalbuterol  0.63 mg Nebulization TID  . metoprolol  5 mg Intravenous 4 times per day  . pantoprazole (PROTONIX) IV  40 mg Intravenous Q24H  . sodium chloride flush  3 mL Intravenous Q12H   Elmarie Shiley, MD 09/25/2015, 8:38 AM

## 2015-09-25 NOTE — Progress Notes (Signed)
Patient ID: Monica [name], female   DOB: [birth year], 82 y.o.   MRN: [id number] [facility] DAILY ICU PROGRESS NOTE                   [address]            [place] [address]          [id number]   6 Days Post-Op Procedure(s) (LRB): CORONARY ARTERY BYPASS GRAFTING (CABG) TIMES FOUR USING BILATARAL SAPHENOUS VEIN GRAFTS AND LEFT INTERNAL MAMMARY ARTERY (N/A) TRANSESOPHAGEAL ECHOCARDIOGRAM (TEE) (N/A)  Total Length of Stay:  LOS: 6 days   Subjective: Talking more , remains confused  Objective: Vital signs in last 24 hours: Temp:  [97.6 F (36.4 C)-99.2 F (37.3 C)] 99.2 F (37.3 C) (03/21 0025) Pulse Rate:  [71-98] 88 (03/21 0700) Cardiac Rhythm:  [-] Normal sinus rhythm (03/21 0400) Resp:  [10-27] 15 (03/21 0700) BP: (117-157)/(53-101) 138/62 mmHg (03/21 0700) SpO2:  [98 %-100 %] 100 % (03/21 0755) Weight:  [184 lb 1.4 oz (83.5 kg)] 184 lb 1.4 oz (83.5 kg) (03/21 0248)  Filed Weights   09/23/15 0411 09/24/15 0413 09/25/15 0248  Weight: 192 lb 0.3 oz (87.1 kg) 187 lb 9.8 oz (85.1 kg) 184 lb 1.4 oz (83.5 kg)    Weight change: -3 lb 8.4 oz (-1.6 kg)   Hemodynamic parameters for last 24 hours:    Intake/Output from previous day: 03/20 0701 - 03/21 0700 In: 485.8 [I.V.:112.8; NG/GT:373] Out: 2025 [Urine:2025]  Intake/Output this shift:    Current Meds: Scheduled Meds: . antiseptic oral rinse  7 mL Mouth Rinse BID  . antiseptic oral rinse  7 mL Mouth Rinse QID  . aspirin EC  325 mg Oral Daily   Or  . aspirin  324 mg Per Tube Daily  . bisacodyl  10 mg Rectal Daily  . chlorhexidine gluconate  15 mL Mouth Rinse BID  . enoxaparin (LOVENOX) injection  30 mg Subcutaneous QHS  . feeding supplement (NEPRO CARB STEADY)  1,000 mL Per Tube Q24H  . feeding supplement (PRO-STAT SUGAR FREE 64)  30 mL Per Tube TID  . insulin aspart  0-24 Units Subcutaneous 6 times per day  . levalbuterol  0.63 mg Nebulization TID  . metoprolol  5 mg Intravenous 4 times per day  .  pantoprazole (PROTONIX) IV  40 mg Intravenous Q24H  . sodium chloride flush  3 mL Intravenous Q12H   Continuous Infusions: . sodium chloride 250 mL (09/24/15 1813)  . DOPamine 3 mcg/kg/min (09/25/15 0700)   PRN Meds:.metoprolol, ondansetron (ZOFRAN) IV, sodium chloride flush  Moves all extremities to command, question of mild neglect left side   Lab Results: CBC: Recent Labs  09/24/15 0419 09/25/15 0417  WBC 11.9* 8.8  HGB 8.2* 7.8*  HCT 25.3* 24.3*  PLT 228 271   BMET:  Recent Labs  09/24/15 0419 09/25/15 0417  NA 138 144  K 4.5 4.1  CL 102 103  CO2 23 26  GLUCOSE 139* 224*  BUN 64* 74*  CREATININE 4.20* 3.42*  CALCIUM 7.3* 7.6*    PT/INR: No results for input(s): LABPROT, INR in the last 72 hours. Radiology: Ct Head Wo Contrast  09/24/2015  CLINICAL DATA:  Confusion, swallowing difficulty, status post CABG 5 days ago EXAM: CT HEAD WITHOUT CONTRAST TECHNIQUE: Contiguous axial images were obtained from the base of the skull through the vertex without intravenous contrast. COMPARISON:  None. FINDINGS: Motion degraded images. No evidence of parenchymal hemorrhage or extra-axial  fluid collection. No mass lesion, mass effect, or midline shift. No CT evidence of acute infarction. Old left basal ganglia lacunar infarct. Subcortical white matter and periventricular small vessel ischemic changes. Intracranial atherosclerosis. Global cortical and central atrophy.  No ventriculomegaly. Partial opacification of the right ethmoid sinus. The mastoid air cells are unopacified. Right nasal tube. No evidence of calvarial fracture. IMPRESSION: No evidence of acute intracranial abnormality. Old left basal ganglia lacunar infarct. Atrophy with small vessel ischemic changes. Electronically Signed   By: Julian Hy M.D.   On: 09/24/2015 10:51   Ir US Guide Vasc Access Left  09/24/2015  CLINICAL DATA:  Altered mental status, renal failure, needs venous access for infusion therapy EXAM:  TUNNELED CENTRAL VENOUS CATHETER PLACEMENT WITH ULTRASOUND AND FLUOROSCOPIC GUIDANCE TECHNIQUE: The procedure, risks, benefits, and alternatives were explained to the family. Questions regarding the procedure were encouraged and answered. The family understands and consents to the procedure. An appropriate skin site was determined. Region was prepped using maximum barrier technique including cap and mask, sterile gown, sterile gloves, large sterile sheet, and Chlorhexidine as cutaneous antisepsis. The region was infiltrated locally with 1% lidocaine. Left IJ approach was selected because of an indwelling right jugular catheter. Under real-time ultrasound guidance, the left IJ vein was accessed with a 21 gauge micropuncture needle; the needle tip within the vein was confirmed with ultrasound image documentation. 72F dual-lumen cuffed powerPICC tunneled from a righ left t anterior chest wall approach to the dermatotomy site. Needle exchanged over the 018 guidewire for transitional dilator, through which the catheter which had been cut to 28 cm was advanced under intermittent fluoroscopy, positioned with its tip at the cavoatrial junction. Spot chest radiograph confirms good catheter position. No pneumothorax. Catheter was flushed per protocol. Catheter secured externally with O Prolene suture. The left IJ dermatotomy site was closed with Dermabond. COMPLICATIONS: COMPLICATIONS None immediate FLUOROSCOPY TIME:  30 seconds, 3 mGy COMPARISON:  None IMPRESSION: 1. Technically successful placement of tunneled left IJ tunneled dual-lumen power injectable catheter with ultrasound and fluoroscopic guidance. Ready for routine use. Electronically Signed   By: Lucrezia Europe M.D.   On: 09/24/2015 15:16   Dg Abd Portable 1v  09/24/2015  CLINICAL DATA:  Feeding tube placement EXAM: PORTABLE ABDOMEN - 1 VIEW COMPARISON:  Nine/ 19/15 FINDINGS: There is normal small bowel gas pattern. The patient is status post CABG. There is and she  feeding tube with tip in and distal stomach. IMPRESSION: NG feeding tube with tip in distal stomach. Electronically Signed   By: Lahoma Crocker M.D.   On: 09/24/2015 11:12   Ir Fluoro Guide Cv Midline Picc Left  09/24/2015  CLINICAL DATA:  Altered mental status, renal failure, needs venous access for infusion therapy EXAM: TUNNELED CENTRAL VENOUS CATHETER PLACEMENT WITH ULTRASOUND AND FLUOROSCOPIC GUIDANCE TECHNIQUE: The procedure, risks, benefits, and alternatives were explained to the family. Questions regarding the procedure were encouraged and answered. The family understands and consents to the procedure. An appropriate skin site was determined. Region was prepped using maximum barrier technique including cap and mask, sterile gown, sterile gloves, large sterile sheet, and Chlorhexidine as cutaneous antisepsis. The region was infiltrated locally with 1% lidocaine. Left IJ approach was selected because of an indwelling right jugular catheter. Under real-time ultrasound guidance, the left IJ vein was accessed with a 21 gauge micropuncture needle; the needle tip within the vein was confirmed with ultrasound image documentation. 72F dual-lumen cuffed powerPICC tunneled from a righ left t anterior chest wall approach  to the dermatotomy site. Needle exchanged over the 018 guidewire for transitional dilator, through which the catheter which had been cut to 28 cm was advanced under intermittent fluoroscopy, positioned with its tip at the cavoatrial junction. Spot chest radiograph confirms good catheter position. No pneumothorax. Catheter was flushed per protocol. Catheter secured externally with O Prolene suture. The left IJ dermatotomy site was closed with Dermabond. COMPLICATIONS: COMPLICATIONS None immediate FLUOROSCOPY TIME:  30 seconds, 3 mGy COMPARISON:  None IMPRESSION: 1. Technically successful placement of tunneled left IJ tunneled dual-lumen power injectable catheter with ultrasound and fluoroscopic guidance.  Ready for routine use. Electronically Signed   By: Lucrezia Europe M.D.   On: 09/24/2015 15:16     Assessment/Plan: S/P Procedure(s) (LRB): CORONARY ARTERY BYPASS GRAFTING (CABG) TIMES FOUR USING BILATARAL SAPHENOUS VEIN GRAFTS AND LEFT INTERNAL MAMMARY ARTERY (N/A) TRANSESOPHAGEAL ECHOCARDIOGRAM (TEE) (N/A) Mobilize Diuresis Cr level decreasing Metabolic encephalopathy  - remains confused , old lanunar infarcts presenrt On tube feeding    [name] 09/25/2015 8:01 AM

## 2015-09-25 NOTE — Procedures (Signed)
ELECTROENCEPHALOGRAM REPORT  Date of Study: 09/25/2015  Patient's Name: Monica [name] MRN: [id number] Date of Birth: [birth year]  Referring Provider: Etta Quill, PA-C  Clinical History: This is an 82 year old woman with chest pain, altered mental status  Medications: aspirin EC tablet 325 mg ferumoxytol (FERAHEME) 510 mg in sodium chloride 0.9 % 100 mL IVPB furosemide (LASIX) injection 40 mg insulin aspart (novoLOG) injection 0-24 Units levalbuterol (XOPENEX) nebulizer solution 0.63 mg metoprolol (LOPRESSOR) injection 2.5-5 mg pantoprazole (PROTONIX) injection 40 mg  Technical Summary: A multichannel digital EEG recording measured by the international 10-20 system with electrodes applied with paste and impedances below 5000 ohms performed as portable with EKG monitoring in an awake and asleep patient.  Hyperventilation and photic stimulation were not performed.  The digital EEG was referentially recorded, reformatted, and digitally filtered in a variety of bipolar and referential montages for optimal display.   Description: The patient is awake and asleep during the recording.  There is no clear posterior dominant rhythm. The background consists of a large amount of diffuse 4-7 Hz theta and occasional 2-3 Hz delta slowing.  During drowsiness and sleep, there is an increase in theta and delta slowing of the background with poorly formed vertex waves seen.  Hyperventilation and photic stimulation were not performed.  There were no epileptiform discharges or electrographic seizures seen.    EKG lead was unremarkable.  Impression: This awake and asleep EEG is abnormal due to moderate diffuse slowing of the waking background.  Clinical Correlation of the above findings indicates diffuse cerebral dysfunction that is non-specific in etiology and can be seen with hypoxic/ischemic injury, toxic/metabolic encephalopathies, or medication effect.  The absence of epileptiform discharges does not rule  out a clinical diagnosis of epilepsy.  Clinical correlation is advised.   Ellouise Newer, M.D.

## 2015-09-25 NOTE — Progress Notes (Signed)
Speech Language Pathology Treatment: Dysphagia  Patient Details Name: Monica [name] MRN: [id number] DOB: 20-Oct-1930 Today's Date: 09/25/2015 Time: [id number] SLP Time Calculation (min) (ACUTE ONLY): 10 min  Assessment / Plan / Recommendation Clinical Impression  Pt demonstrates sustained attention to PO trials, though she does not verbally respond to most questions and is visibly confused. When presented with PO pt is automatic and appropriate. She attempts to consume liquids with large consecutive sips impairing her respiratory pattern and resulting in intermittent throat clearing after sips, though RN reports she has been doing this all day habitually.  SLP attempted to limit bolus size, but pt was persistently taking very large boluses and would likely to continue to do so with other staff.  Trials of nectar thick liquids were better tolerated even if pt was allowed to take larger consecutive sips. Recommend initiating a Dys 1 (puree) diet and nectar thick liquids, continuing to rely on NG tube feeds until pt demonstrates consistent arousal for PO intake.    HPI HPI: Monica [name] is a 82 y.o. female with PMH of hypertension, hyperlipidemia, diabetes mellitus, asthma, GERD, gout, chronic kidney disease-stage III, peptic ulcer disease, who presents with chest pain.  The patient is s/p CABG x 4 and per nursing has been coughing with intake.  Per nursing the patient was relatively independent prior to admission with not reported swallowing issues.  Current chest xray is showing stable mild-to-moderate congestive heart failure, stable small left pleural effusion and bibasilar lung opacities, left greater than right, slightly      SLP Plan  Continue with current plan of care     Recommendations  Diet recommendations: Dysphagia 1 (puree);Nectar-thick liquid Liquids provided via: Cup;Straw Medication Administration: Via alternative means Supervision: Full supervision/cueing for compensatory  strategies Compensations: Minimize environmental distractions;Slow rate;Small sips/bites Postural Changes and/or Swallow Maneuvers: Seated upright 90 degrees             Oral Care Recommendations: Oral care BID Follow up Recommendations: Skilled Nursing facility Plan: Continue with current plan of care     Kenly Wendee Hata, MA CCC-SLP [id number]  [name] 09/25/2015, 2:11 PM

## 2015-09-25 NOTE — Progress Notes (Signed)
EEG Completed; Results Pending  

## 2015-09-25 NOTE — Clinical Documentation Improvement (Signed)
Cardiothoracic  Can the diagnosis of CHF be further specified?    Acuity - Acute, Chronic, Acute on Chronic   Type - Systolic, Diastolic, Systolic and Diastolic  Other  Clinically Undetermined  Supporting Information: -- Per Note 3/18 3d postop: "Mild-to-moderate congestive heart failure, slightly worsened. Stable small left pleural effusion."  Please exercise your independent, professional judgment when responding. A specific answer is not anticipated or expected.  Thank You,  Ezekiel Ina RN [place] [phone]

## 2015-09-26 ENCOUNTER — Inpatient Hospital Stay (HOSPITAL_COMMUNITY): Payer: Medicare HMO

## 2015-09-26 LAB — GLUCOSE, CAPILLARY
GLUCOSE-CAPILLARY: 144 mg/dL — AB (ref 65–99)
GLUCOSE-CAPILLARY: 344 mg/dL — AB (ref 65–99)
Glucose-Capillary: 193 mg/dL — ABNORMAL HIGH (ref 65–99)
Glucose-Capillary: 291 mg/dL — ABNORMAL HIGH (ref 65–99)
Glucose-Capillary: 299 mg/dL — ABNORMAL HIGH (ref 65–99)

## 2015-09-26 LAB — BASIC METABOLIC PANEL
Anion gap: 15 (ref 5–15)
BUN: 83 mg/dL — ABNORMAL HIGH (ref 6–20)
CO2: 29 mmol/L (ref 22–32)
Calcium: 7.8 mg/dL — ABNORMAL LOW (ref 8.9–10.3)
Chloride: 102 mmol/L (ref 101–111)
Creatinine, Ser: 2.89 mg/dL — ABNORMAL HIGH (ref 0.44–1.00)
GFR calc Af Amer: 16 mL/min — ABNORMAL LOW (ref >60)
GFR calc non Af Amer: 14 mL/min — ABNORMAL LOW (ref >60)
Glucose, Bld: 163 mg/dL — ABNORMAL HIGH (ref 65–99)
Potassium: 3.7 mmol/L (ref 3.5–5.1)
Sodium: 146 mmol/L — ABNORMAL HIGH (ref 135–145)

## 2015-09-26 LAB — RPR: RPR: REACTIVE — AB

## 2015-09-26 LAB — CBC
HCT: 25.1 % — ABNORMAL LOW (ref 36.0–46.0)
Hemoglobin: 8.3 g/dL — ABNORMAL LOW (ref 12.0–15.0)
MCH: 29.2 pg (ref 26.0–34.0)
MCHC: 33.1 g/dL (ref 30.0–36.0)
MCV: 88.4 fL (ref 78.0–100.0)
Platelets: 338 10*3/uL (ref 150–400)
RBC: 2.84 MIL/uL — ABNORMAL LOW (ref 3.87–5.11)
RDW: 14.6 % (ref 11.5–15.5)
WBC: 11.3 10*3/uL — ABNORMAL HIGH (ref 4.0–10.5)

## 2015-09-26 LAB — RPR, QUANT+TP ABS (REFLEX)
Rapid Plasma Reagin, Quant: 1:1 {titer} — ABNORMAL HIGH
T Pallidum Abs: NEGATIVE

## 2015-09-26 MED ORDER — DEXTROSE 5 % IV SOLN
INTRAVENOUS | Status: DC
  Administered 2015-09-26: 11:00:00 via INTRAVENOUS
  Administered 2015-09-27: 75 mL via INTRAVENOUS

## 2015-09-26 MED ORDER — SODIUM CHLORIDE 0.9% FLUSH
10.0000 mL | Freq: Two times a day (BID) | INTRAVENOUS | Status: DC
  Administered 2015-09-26 – 2015-09-27 (×2): 10 mL via INTRAVENOUS

## 2015-09-26 MED ORDER — PANTOPRAZOLE SODIUM 40 MG PO TBEC
40.0000 mg | DELAYED_RELEASE_TABLET | Freq: Every day | ORAL | Status: DC
  Administered 2015-09-27: 40 mg via ORAL
  Filled 2015-09-26: qty 1

## 2015-09-26 MED ORDER — FUROSEMIDE 10 MG/ML IJ SOLN
40.0000 mg | Freq: Every day | INTRAMUSCULAR | Status: DC
  Administered 2015-09-27 – 2015-10-03 (×7): 40 mg via INTRAVENOUS
  Filled 2015-09-26 (×7): qty 4

## 2015-09-26 MED ORDER — SODIUM CHLORIDE 0.9% FLUSH
10.0000 mL | Freq: Two times a day (BID) | INTRAVENOUS | Status: DC
  Administered 2015-09-26 – 2015-09-27 (×4): 10 mL via INTRAVENOUS

## 2015-09-26 MED ORDER — SODIUM CHLORIDE 0.9% FLUSH: 10.0000 mL | INTRAVENOUS | Status: DC | PRN

## 2015-09-26 NOTE — Progress Notes (Signed)
Speech Language Pathology Treatment: Dysphagia  Patient Details Name: Monica [name] MRN: [id number] DOB: 1931-06-10 Today's Date: 09/26/2015 Time: [id number] SLP Time Calculation (min) (ACUTE ONLY): 29 min  Assessment / Plan / Recommendation Clinical Impression  Assisted pt with am meal. Pt was fully alert and feeding herself with mod assist for set up. NG tube now out. Despite clear vocal quality, pt persistently demonstrates throat clearing following bites and sips of all textures, consistently with sips of thin liquids. Moderate tactile cues for single sips do not eliminate throat clearing. Suspected that this was behavioral or related to GER, but family at bedside today report that pt did not do this prior to admit and that it was not observed until she had a choking episode with jello three days ago. MD had expected an MBS, but pts mental status declined at that time and test was postponed. Given prior concern for aspiration and ongoing throat clearing, recommend proceeding with objective test. MBS recommended for visualization of esophageal function.    HPI HPI: Monica [name] is a 82 y.o. female with PMH of hypertension, hyperlipidemia, diabetes mellitus, asthma, GERD, gout, chronic kidney disease-stage III, peptic ulcer disease, who presents with chest pain.  The patient is s/p CABG x 4 and per nursing has been coughing with intake.  Per nursing the patient was relatively independent prior to admission with not reported swallowing issues.  Current chest xray is showing stable mild-to-moderate congestive heart failure, stable small left pleural effusion and bibasilar lung opacities, left greater than right, slightly      SLP Plan  Continue with current plan of care     Recommendations  Diet recommendations: Dysphagia 1 (puree);Nectar-thick liquid Liquids provided via: Cup Medication Administration: Via alternative means Supervision: Full supervision/cueing for compensatory  strategies Compensations: Minimize environmental distractions;Slow rate;Small sips/bites Postural Changes and/or Swallow Maneuvers: Seated upright 90 degrees             Oral Care Recommendations: Oral care BID Follow up Recommendations: Skilled Nursing facility Plan: Continue with current plan of care     [place] Monica Cahn, MA CCC-SLP [id number]  Monica [name] 09/26/2015, 10:13 AM

## 2015-09-26 NOTE — Progress Notes (Signed)
MBSS complete. Full report located under chart review in imaging section.  Daunte Oestreich Paiewonsky, M.A. CCC-SLP [phone]  

## 2015-09-26 NOTE — Progress Notes (Signed)
Pt refused to allow RN to draw labs. Pt became combative when trying to access CVC.     Henreitta Leber, RN 09/26/2015

## 2015-09-26 NOTE — Progress Notes (Signed)
Patient ID: Monica [name], female   DOB: 1930-08-23, 82 y.o.   MRN: [id number] T[facility] DAILY ICU PROGRESS NOTE                   [address]            [place] [address]          [phone]   7 Days Post-Op Procedure(s) (LRB): CORONARY ARTERY BYPASS GRAFTING (CABG) TIMES FOUR USING BILATARAL SAPHENOUS VEIN GRAFTS AND LEFT INTERNAL MAMMARY ARTERY (N/A) TRANSESOPHAGEAL ECHOCARDIOGRAM (TEE) (N/A)  Total Length of Stay:  LOS: 7 days   Subjective: Remains confused , pulled panda out during night  Objective: Vital signs in last 24 hours: Temp:  [97.7 F (36.5 C)-98.9 F (37.2 C)] 97.7 F (36.5 C) (03/22 0400) Pulse Rate:  [82-94] 88 (03/22 0700) Cardiac Rhythm:  [-] Normal sinus rhythm (03/22 0400) Resp:  [12-22] 16 (03/22 0700) BP: (109-157)/(45-86) 144/59 mmHg (03/22 0700) SpO2:  [90 %-100 %] 96 % (03/22 0700) Weight:  [183 lb 10.3 oz (83.3 kg)] 183 lb 10.3 oz (83.3 kg) (03/22 0400)  Filed Weights   09/24/15 0413 09/25/15 0248 09/26/15 0400  Weight: 187 lb 9.8 oz (85.1 kg) 184 lb 1.4 oz (83.5 kg) 183 lb 10.3 oz (83.3 kg)    Weight change: -7.1 oz (-0.2 kg)   Hemodynamic parameters for last 24 hours:    Intake/Output from previous day: 03/21 0701 - 03/22 0700 In: 704.7 [I.V.:4.7; NG/GT:700] Out: 2035 [Urine:2035]  Intake/Output this shift:    Current Meds: Scheduled Meds: . antiseptic oral rinse  7 mL Mouth Rinse BID  . antiseptic oral rinse  7 mL Mouth Rinse QID  . aspirin EC  325 mg Oral Daily   Or  . aspirin  324 mg Per Tube Daily  . bisacodyl  10 mg Rectal Daily  . chlorhexidine gluconate  15 mL Mouth Rinse BID  . enoxaparin (LOVENOX) injection  30 mg Subcutaneous QHS  . feeding supplement (NEPRO CARB STEADY)  1,000 mL Per Tube Q24H  . feeding supplement (PRO-STAT SUGAR FREE 64)  30 mL Per Tube TID  . ferumoxytol  510 mg Intravenous Q72H  . furosemide  40 mg Intravenous BID  . insulin aspart  0-24 Units Subcutaneous 6 times per day  .  levalbuterol  0.63 mg Nebulization TID  . metoprolol tartrate  12.5 mg Per Tube BID  . pantoprazole (PROTONIX) IV  40 mg Intravenous Q24H  . sodium chloride flush  3 mL Intravenous Q12H   Continuous Infusions: . sodium chloride 250 mL (09/24/15 1813)   PRN Meds:.metoprolol, ondansetron (ZOFRAN) IV, RESOURCE THICKENUP CLEAR, sodium chloride flush  General appearance: slowed mentation and uncooperative Neurologic: intact Heart: regular rate and rhythm, S1, S2 normal, no murmur, click, rub or gallop Lungs: diminished breath sounds bibasilar Abdomen: soft, non-tender; bowel sounds normal; no masses,  no organomegaly Extremities: extremities normal, atraumatic, no cyanosis or edema and Homans sign is negative, no sign of DVT Wound: sternum inatct  Lab Results: CBC:  Recent Labs  09/25/15 0417 09/26/15 0801  WBC 8.8 11.3*  HGB 7.8* 8.3*  HCT 24.3* 25.1*  PLT 271 338   BMET:   Recent Labs  09/24/15 0419 09/25/15 0417  NA 138 144  K 4.5 4.1  CL 102 103  CO2 23 26  GLUCOSE 139* 224*  BUN 64* 74*  CREATININE 4.20* 3.42*  CALCIUM 7.3* 7.6*    PT/INR: No results for input(s): LABPROT, INR in the  last 72 hours. Radiology: Dg Chest Port 1 View  09/26/2015  CLINICAL DATA:  CHF, left pleural effusion. EXAM: PORTABLE CHEST 1 VIEW COMPARISON:  Portable chest x-ray of September 25, 2015 FINDINGS: The lungs are adequately inflated. There is persistent left lower lobe atelectasis or pneumonia. There are small bilateral pleural effusions greater on the left than on the right. The cardiac silhouette remains enlarged. The pulmonary vascularity remains engorged. The pulmonary interstitial markings are slightly less conspicuous today. The patient has undergone previous CABG. The left internal jugular venous catheter tip projects over the proximal SVC. IMPRESSION: Slight interval improvement in pulmonary interstitial edema. There remains left basilar atelectasis and mild CHF with small bilateral  pleural effusions. Electronically Signed   By: David  Martinique M.D.   On: 09/26/2015 07:53     Assessment/Plan: S/P Procedure(s) (LRB): CORONARY ARTERY BYPASS GRAFTING (CABG) TIMES FOUR USING BILATARAL SAPHENOUS VEIN GRAFTS AND LEFT INTERNAL MAMMARY ARTERY (N/A) TRANSESOPHAGEAL ECHOCARDIOGRAM (TEE) (N/A) Waiting for renal functions last pm Mental status still confused but talking more Replace panda     Monica [name] 09/26/2015 8:15 AM

## 2015-09-26 NOTE — Progress Notes (Signed)
Physical Therapy Treatment Patient Details Name: Monica [name] MRN: [id number] DOB: 07/30/1930 Today's Date: 09/26/2015    History of Present Illness Daleisa Strege is a 82 y.o. female with PMH of hypertension, hyperlipidemia, diabetes mellitus, asthma, GERD, gout, chronic kidney disease-stage III, peptic ulcer disease, who presents with chest pain. For cardiac cath 09/17/2015; underwent CABG x 4 on 09/19/15    PT Comments    Patient continues to be pleasantly confused today however more alert and conversive this session. Requires assist of 2 to get to EOB and for SPT to chair. Not able to comprehend sternal precautions. Continues to demonstrate weakness throughout LEs. Will plan for gait training next session as tolerated. Will follow acutely.   Follow Up Recommendations  SNF     Equipment Recommendations  Other (comment) (TBA)    Recommendations for Other Services       Precautions / Restrictions Precautions Precautions: Fall;Sternal    Mobility  Bed Mobility Overal bed mobility: Needs Assistance Bed Mobility: Supine to Sit     Supine to sit: Mod assist;+2 for physical assistance;HOB elevated     General bed mobility comments: Assist for BLEs, trunk and to scoot bottom to EOB. No dizziness.   Transfers Overall transfer level: Needs assistance Equipment used: 2 person hand held assist Transfers: Sit to/from Stand Sit to Stand: Mod assist;+2 physical assistance Stand pivot transfers: Mod assist;+2 physical assistance       General transfer comment: Assist of 2 to stand from EOB with cues for hand placement/technique. Use of body momentum. SPT bed to chair with assist of 2 for balance and cues for placement of bil feet.   Ambulation/Gait                 Stairs            Wheelchair Mobility    Modified Rankin (Stroke Patients Only)       Balance Overall balance assessment: Needs assistance Sitting-balance support: Feet supported;No upper extremity  supported Sitting balance-Leahy Scale: Fair     Standing balance support: During functional activity Standing balance-Leahy Scale: Poor Standing balance comment: Reliant on BUE support for balance.                     Cognition Arousal/Alertness: Awake/alert Behavior During Therapy: WFL for tasks assessed/performed Overall Cognitive Status: Impaired/Different from baseline Area of Impairment: Orientation;Attention;Memory Orientation Level: Disoriented to;Place;Time;Situation Current Attention Level: Focused Memory: Decreased short-term memory         General Comments: Continues to be confused. not able to state who was in the room with her, (her oldest son).    Exercises General Exercises - Lower Extremity Ankle Circles/Pumps: Both;10 reps;Supine Long Arc Quad: Both;10 reps;Seated    General Comments General comments (skin integrity, edema, etc.): pt's son present in room during session.      Pertinent Vitals/Pain Pain Assessment: Faces Faces Pain Scale: Hurts a little bit Pain Location: back Pain Descriptors / Indicators: Grimacing Pain Intervention(s): Monitored during session;Repositioned    Home Living                      Prior Function            PT Goals (current goals can now be found in the care plan section) Progress towards PT goals: Progressing toward goals    Frequency  Min 3X/week    PT Plan Current plan remains appropriate    Co-evaluation  End of Session Equipment Utilized During Treatment: Gait belt Activity Tolerance: Patient tolerated treatment well Patient left: in chair;with call bell/phone within reach;with chair alarm set;with family/visitor present     Time: 1025-1040 PT Time Calculation (min) (ACUTE ONLY): 15 min  Charges:  $Therapeutic Activity: 8-22 mins                    G Codes:      Erminio Nygard A Council Munguia 09/26/2015, 11:54 AM Wray Kearns, PT, DPT [phone]

## 2015-09-26 NOTE — Progress Notes (Signed)
Patient ID: Monica [name], female   DOB: 1931/05/25, 82 y.o.   MRN: [id number]  [facility] Progress Note   Assessment/ Plan:   1. AKI on CKD stage III: Continues to maintain good urine output with improving renal function seen on labs-no acute electrolyte abnormalities to prompt intervention at this time. I will decrease diuretic dose with her rising BUN/sodium level. Unfortunately, NG tube removed and her mental status does not permit adequate oral intake. She will need free water with rising sodium. 2. S/P 4 Vessel CABG: Doing well from cardiothoracic surgery standpoint. 3. Anemia: Secondary to recent surgery/critical illness, will check iron studies today and decide on need for IV iron/ESA. 4. Altered mental status: Remains confused, plans in place for feeding tube placement as she is unlikely to pass her swallow evaluation. 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 Hypernatremia: Secondary to ongoing diuretic therapy/inability of free water intake with altered mentation. Will start D5 water drip  Subjective:   Remains confused overnight, No significant clinical changes overnight.    Objective:   BP 144/59 mmHg  Pulse 88  Temp(Src) 97.7 F (36.5 C) (Axillary)  Resp 16  Ht 5\' 2"  (1.575 m)  Wt 83.3 kg (183 lb 10.3 oz)  BMI 33.58 kg/m2  SpO2 96%  Intake/Output Summary (Last 24 hours) at 09/26/15 0852 Last data filed at 09/26/15 0600  Gross per 24 hour  Intake    665 ml  Output   1915 ml  Net  -1250 ml   Weight change: -0.2 kg (-7.1 oz)  Physical Exam: Gen: Appears confused and swinging hands to and fro.  CVS: Pulse regular in rate and rhythm, S1 and S2 normal Resp: Coarse breath sounds-no distinct rales Abd: Soft, obese, nontender Ext: Trace lower extremity edema  Imaging: Ct Head Wo Contrast  09/24/2015  CLINICAL DATA:  Confusion, swallowing difficulty, status post CABG 5 days ago EXAM: CT HEAD WITHOUT  CONTRAST TECHNIQUE: Contiguous axial images were obtained from the base of the skull through the vertex without intravenous contrast. COMPARISON:  None. FINDINGS: Motion degraded images. No evidence of parenchymal hemorrhage or extra-axial fluid collection. No mass lesion, mass effect, or midline shift. No CT evidence of acute infarction. Old left basal ganglia lacunar infarct. Subcortical white matter and periventricular small vessel ischemic changes. Intracranial atherosclerosis. Global cortical and central atrophy.  No ventriculomegaly. Partial opacification of the right ethmoid sinus. The mastoid air cells are unopacified. Right nasal tube. No evidence of calvarial fracture. IMPRESSION: No evidence of acute intracranial abnormality. Old left basal ganglia lacunar infarct. Atrophy with small vessel ischemic changes. Electronically Signed   By: Julian Hy M.D.   On: 09/24/2015 10:51   Ir US Guide Vasc Access Left  09/24/2015  CLINICAL DATA:  Altered mental status, renal failure, needs venous access for infusion therapy EXAM: TUNNELED CENTRAL VENOUS CATHETER PLACEMENT WITH ULTRASOUND AND FLUOROSCOPIC GUIDANCE TECHNIQUE: The procedure, risks, benefits, and alternatives were explained to the family. Questions regarding the procedure were encouraged and answered. The family understands and consents to the procedure. An appropriate skin site was determined. Region was prepped using maximum barrier technique including cap and mask, sterile gown, sterile gloves, large sterile sheet, and Chlorhexidine as cutaneous antisepsis. The region was infiltrated locally with 1% lidocaine. Left IJ approach was selected because of an indwelling right jugular catheter. Under real-time ultrasound guidance, the left IJ vein was accessed with a 21 gauge micropuncture needle; the needle tip within  the vein was confirmed with ultrasound image documentation. 61F dual-lumen cuffed powerPICC tunneled from a righ left t anterior chest  wall approach to the dermatotomy site. Needle exchanged over the 018 guidewire for transitional dilator, through which the catheter which had been cut to 28 cm was advanced under intermittent fluoroscopy, positioned with its tip at the cavoatrial junction. Spot chest radiograph confirms good catheter position. No pneumothorax. Catheter was flushed per protocol. Catheter secured externally with O Prolene suture. The left IJ dermatotomy site was closed with Dermabond. COMPLICATIONS: COMPLICATIONS None immediate FLUOROSCOPY TIME:  30 seconds, 3 mGy COMPARISON:  None IMPRESSION: 1. Technically successful placement of tunneled left IJ tunneled dual-lumen power injectable catheter with ultrasound and fluoroscopic guidance. Ready for routine use. Electronically Signed   By: Lucrezia Europe M.D.   On: 09/24/2015 15:16   Dg Chest Port 1 View  09/26/2015  CLINICAL DATA:  CHF, left pleural effusion. EXAM: PORTABLE CHEST 1 VIEW COMPARISON:  Portable chest x-ray of September 25, 2015 FINDINGS: The lungs are adequately inflated. There is persistent left lower lobe atelectasis or pneumonia. There are small bilateral pleural effusions greater on the left than on the right. The cardiac silhouette remains enlarged. The pulmonary vascularity remains engorged. The pulmonary interstitial markings are slightly less conspicuous today. The patient has undergone previous CABG. The left internal jugular venous catheter tip projects over the proximal SVC. IMPRESSION: Slight interval improvement in pulmonary interstitial edema. There remains left basilar atelectasis and mild CHF with small bilateral pleural effusions. Electronically Signed   By: David  Martinique M.D.   On: 09/26/2015 07:53   Dg Chest Port 1 View  09/25/2015  CLINICAL DATA:  Shortness of breath, history of asthma, Coronary artery disease, previous CABG, diabetes. EXAM: PORTABLE CHEST 1 VIEW COMPARISON:  Portable chest x-ray of September 24, 2015 FINDINGS: The lungs are borderline  hypoinflated. The pulmonary interstitial markings remain increased. The left hemidiaphragm remains obscured. The cardiac silhouette remains enlarged and the pulmonary vascularity engorged. The feeding tube tip projects below the inferior margin of the image. The left internal jugular venous catheter tip projects over the proximal SVC. The bony thorax exhibits no acute abnormality. IMPRESSION: CHF with pulmonary interstitial edema with stable left basilar atelectasis and small effusion. No significant change since yesterday's study. Electronically Signed   By: David  Martinique M.D.   On: 09/25/2015 08:01   Dg Abd Portable 1v  09/24/2015  CLINICAL DATA:  Feeding tube placement EXAM: PORTABLE ABDOMEN - 1 VIEW COMPARISON:  Nine/ 19/15 FINDINGS: There is normal small bowel gas pattern. The patient is status post CABG. There is and she feeding tube with tip in and distal stomach. IMPRESSION: NG feeding tube with tip in distal stomach. Electronically Signed   By: Lahoma Crocker M.D.   On: 09/24/2015 11:12   Ir Fluoro Guide Cv Midline Picc Left  09/24/2015  CLINICAL DATA:  Altered mental status, renal failure, needs venous access for infusion therapy EXAM: TUNNELED CENTRAL VENOUS CATHETER PLACEMENT WITH ULTRASOUND AND FLUOROSCOPIC GUIDANCE TECHNIQUE: The procedure, risks, benefits, and alternatives were explained to the family. Questions regarding the procedure were encouraged and answered. The family understands and consents to the procedure. An appropriate skin site was determined. Region was prepped using maximum barrier technique including cap and mask, sterile gown, sterile gloves, large sterile sheet, and Chlorhexidine as cutaneous antisepsis. The region was infiltrated locally with 1% lidocaine. Left IJ approach was selected because of an indwelling right jugular catheter. Under real-time ultrasound guidance, the left IJ  vein was accessed with a 21 gauge micropuncture needle; the needle tip within the vein was  confirmed with ultrasound image documentation. 27F dual-lumen cuffed powerPICC tunneled from a righ left t anterior chest wall approach to the dermatotomy site. Needle exchanged over the 018 guidewire for transitional dilator, through which the catheter which had been cut to 28 cm was advanced under intermittent fluoroscopy, positioned with its tip at the cavoatrial junction. Spot chest radiograph confirms good catheter position. No pneumothorax. Catheter was flushed per protocol. Catheter secured externally with O Prolene suture. The left IJ dermatotomy site was closed with Dermabond. COMPLICATIONS: COMPLICATIONS None immediate FLUOROSCOPY TIME:  30 seconds, 3 mGy COMPARISON:  None IMPRESSION: 1. Technically successful placement of tunneled left IJ tunneled dual-lumen power injectable catheter with ultrasound and fluoroscopic guidance. Ready for routine use. Electronically Signed   By: Lucrezia Europe M.D.   On: 09/24/2015 15:16    Labs: BMET  Recent Labs Lab 09/21/15 1555 09/22/15 0547 09/22/15 1900 09/23/15 0421 09/24/15 0419 09/25/15 0417 09/26/15 0801  NA 137 139 137 137 138 144 146*  K 5.2* 4.5 5.1 4.9 4.5 4.1 3.7  CL 102 102 103 102 102 103 102  CO2 22 23 23 23 23 26 29   GLUCOSE 314* 79 200* 158* 139* 224* 163*  BUN 37* 44* 51* 55* 64* 74* 83*  CREATININE 4.04* 4.52* 4.60* 4.83* 4.20* 3.42* 2.89*  CALCIUM 6.8* 7.0* 6.9* 6.9* 7.3* 7.6* 7.8*   CBC  Recent Labs Lab 09/23/15 0421 09/24/15 0419 09/25/15 0417 09/26/15 0801  WBC 13.9* 11.9* 8.8 11.3*  HGB 8.1* 8.2* 7.8* 8.3*  HCT 24.5* 25.3* 24.3* 25.1*  MCV 87.5 87.2 86.8 88.4  PLT 188 228 271 338    Medications:    . antiseptic oral rinse  7 mL Mouth Rinse BID  . antiseptic oral rinse  7 mL Mouth Rinse QID  . aspirin EC  325 mg Oral Daily   Or  . aspirin  324 mg Per Tube Daily  . bisacodyl  10 mg Rectal Daily  . chlorhexidine gluconate  15 mL Mouth Rinse BID  . enoxaparin (LOVENOX) injection  30 mg Subcutaneous QHS  .  feeding supplement (NEPRO CARB STEADY)  1,000 mL Per Tube Q24H  . feeding supplement (PRO-STAT SUGAR FREE 64)  30 mL Per Tube TID  . ferumoxytol  510 mg Intravenous Q72H  . furosemide  40 mg Intravenous BID  . insulin aspart  0-24 Units Subcutaneous 6 times per day  . levalbuterol  0.63 mg Nebulization TID  . metoprolol tartrate  12.5 mg Per Tube BID  . pantoprazole (PROTONIX) IV  40 mg Intravenous Q24H  . sodium chloride flush  3 mL Intravenous Q12H   Elmarie Shiley, MD 09/26/2015, 8:52 AM

## 2015-09-26 NOTE — Progress Notes (Signed)
EEG resulted. Diffuse slowing was noted. No epileptiform discharges or electrographic seizures seen.   Will need MRI brain once cleared for such by cardiothoracic surgery (pacer wires).   Kerney Elbe, MD

## 2015-09-26 NOTE — Progress Notes (Signed)
Patient ID: Monica [name], female   DOB: 12-11-1930, 82 y.o.   MRN: [id number]  SICU Evening Rounds:  Hemodynamically stable  Urine output ok  MBSS today and results noted. Rec starting dysphagia 1 diet with Nectar thick liquids.

## 2015-09-27 LAB — GLUCOSE, CAPILLARY
Glucose-Capillary: 131 mg/dL — ABNORMAL HIGH (ref 65–99)
Glucose-Capillary: 134 mg/dL — ABNORMAL HIGH (ref 65–99)
Glucose-Capillary: 200 mg/dL — ABNORMAL HIGH (ref 65–99)
Glucose-Capillary: 202 mg/dL — ABNORMAL HIGH (ref 65–99)
Glucose-Capillary: 214 mg/dL — ABNORMAL HIGH (ref 65–99)
Glucose-Capillary: 268 mg/dL — ABNORMAL HIGH (ref 65–99)

## 2015-09-27 LAB — CBC
HCT: 24.9 % — ABNORMAL LOW (ref 36.0–46.0)
Hemoglobin: 8.3 g/dL — ABNORMAL LOW (ref 12.0–15.0)
MCH: 29.6 pg (ref 26.0–34.0)
MCHC: 33.3 g/dL (ref 30.0–36.0)
MCV: 88.9 fL (ref 78.0–100.0)
Platelets: 413 10*3/uL — ABNORMAL HIGH (ref 150–400)
RBC: 2.8 MIL/uL — ABNORMAL LOW (ref 3.87–5.11)
RDW: 14.5 % (ref 11.5–15.5)
WBC: 13.9 10*3/uL — ABNORMAL HIGH (ref 4.0–10.5)

## 2015-09-27 LAB — BASIC METABOLIC PANEL WITH GFR
Anion gap: 11 (ref 5–15)
BUN: 69 mg/dL — ABNORMAL HIGH (ref 6–20)
CO2: 31 mmol/L (ref 22–32)
Calcium: 7.7 mg/dL — ABNORMAL LOW (ref 8.9–10.3)
Chloride: 102 mmol/L (ref 101–111)
Creatinine, Ser: 2.43 mg/dL — ABNORMAL HIGH (ref 0.44–1.00)
GFR calc Af Amer: 20 mL/min — ABNORMAL LOW
GFR calc non Af Amer: 17 mL/min — ABNORMAL LOW
Glucose, Bld: 131 mg/dL — ABNORMAL HIGH (ref 65–99)
Potassium: 3.3 mmol/L — ABNORMAL LOW (ref 3.5–5.1)
Sodium: 144 mmol/L (ref 135–145)

## 2015-09-27 MED ORDER — POTASSIUM CHLORIDE CRYS ER 20 MEQ PO TBCR
20.0000 meq | EXTENDED_RELEASE_TABLET | Freq: Two times a day (BID) | ORAL | Status: AC
  Administered 2015-09-27 (×2): 20 meq via ORAL
  Filled 2015-09-27 (×3): qty 1

## 2015-09-27 MED ORDER — METOPROLOL TARTRATE 12.5 MG HALF TABLET
12.5000 mg | ORAL_TABLET | Freq: Two times a day (BID) | ORAL | Status: DC
  Administered 2015-09-27 (×2): 12.5 mg via ORAL
  Filled 2015-09-27 (×2): qty 1

## 2015-09-27 MED ORDER — LEVALBUTEROL HCL 0.63 MG/3ML IN NEBU
0.6300 mg | INHALATION_SOLUTION | Freq: Four times a day (QID) | RESPIRATORY_TRACT | Status: DC | PRN
  Administered 2015-09-28 – 2015-10-03 (×4): 0.63 mg via RESPIRATORY_TRACT
  Filled 2015-09-27 (×6): qty 3

## 2015-09-27 NOTE — Progress Notes (Addendum)
Patient ID: Monica [name], female   DOB: September 04, 1930, 82 y.o.   MRN: [id number]  [facility] Progress Note   Assessment/ Plan:   1. AKI on CKD stage III: Continues to maintain good urine output with improving renal function seen on labs-no acute electrolyte abnormalities to prompt intervention at this time. Diuretic dose adjusted and will replace potassium. 2. S/P 4 Vessel CABG: Doing well from cardiothoracic surgery standpoint. 3. Anemia: Secondary to recent surgery/critical illness, will check iron studies today and decide on need for IV iron/ESA. 4. Altered mental status: Remains confused, plans in place for feeding tube placement as she is unlikely to pass her swallow evaluation. 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 Hypernatremia: Secondary to ongoing diuretic therapy/inability of free water intake with altered mentation. Became hyperglycemic on D5 water drip---stopped earlier, encouraged oral water intake.   Will sign off at this time---please re-consult if needed.  Subjective:   Remains confused overnight, No significant clinical changes overnight.    Objective:   BP 164/74 mmHg  Pulse 105  Temp(Src) 98.1 F (36.7 C) (Oral)  Resp 15  Ht 5\' 2"  (1.575 m)  Wt 81.7 kg (180 lb 1.9 oz)  BMI 32.94 kg/m2  SpO2 98%  Intake/Output Summary (Last 24 hours) at 09/27/15 0850 Last data filed at 09/27/15 0800  Gross per 24 hour  Intake   1995 ml  Output   1475 ml  Net    520 ml   Weight change: -1.6 kg (-3 lb 8.4 oz)  Physical Exam: Gen: Appears confused and pushes me away as I try to examine her.  CVS: Pulse regular in rate and rhythm, S1 and S2 normal Resp: Coarse breath sounds-no distinct rales Abd: Soft, obese, nontender Ext: No lower extremity edema  Imaging: Dg Chest Port 1 View  09/26/2015  CLINICAL DATA:  CHF, left pleural effusion. EXAM: PORTABLE CHEST 1 VIEW COMPARISON:  Portable chest x-ray of  September 25, 2015 FINDINGS: The lungs are adequately inflated. There is persistent left lower lobe atelectasis or pneumonia. There are small bilateral pleural effusions greater on the left than on the right. The cardiac silhouette remains enlarged. The pulmonary vascularity remains engorged. The pulmonary interstitial markings are slightly less conspicuous today. The patient has undergone previous CABG. The left internal jugular venous catheter tip projects over the proximal SVC. IMPRESSION: Slight interval improvement in pulmonary interstitial edema. There remains left basilar atelectasis and mild CHF with small bilateral pleural effusions. Electronically Signed   By: David  Martinique M.D.   On: 09/26/2015 07:53   Dg Swallowing Func-speech Pathology  09/26/2015  Objective Swallowing Evaluation: Type of Study: MBS-Modified Barium Swallow Study Patient Details Name: Monica [name] MRN: [id number] Date of Birth: 01-11-1931 Today's Date: 09/26/2015 Time: SLP Start Time (ACUTE ONLY): 1405-SLP Stop Time (ACUTE ONLY): 1422 SLP Time Calculation (min) (ACUTE ONLY): 17 min Past Medical History: Past Medical History Diagnosis Date . Peptic ulcer  . Mass  . Essential hypertension  . Hyperlipidemia  . Type 2 diabetes mellitus ([place])  . Asthma  . History of blood transfusion  . CKD (chronic kidney disease), stage III  Past Surgical History: Past Surgical History Procedure Laterality Date . Abdominal hysterectomy   . Colon surgery   . Cardiac catheterization N/A 09/17/2015   Procedure: Left Heart Cath and Coronary Angiography;  Surgeon: Jettie Booze, MD;  Location: [facility];  Service: Cardiovascular;  Laterality: N/A; . Coronary artery bypass graft  N/A 09/19/2015   Procedure: CORONARY ARTERY BYPASS GRAFTING (CABG) TIMES FOUR USING BILATARAL SAPHENOUS VEIN GRAFTS AND LEFT INTERNAL MAMMARY ARTERY;  Surgeon: [name], MD;  Location: [place];  Service: Open Heart Surgery;  Laterality: N/A; . Tee without cardioversion N/A  09/19/2015   Procedure: TRANSESOPHAGEAL ECHOCARDIOGRAM (TEE);  Surgeon: [name], MD;  Location: [place] [facility];  Service: Open Heart Surgery;  Laterality: N/A; HPI: Monica [name] is a 82 y.o. female with PMH of hypertension, hyperlipidemia, diabetes mellitus, asthma, GERD, gout, chronic kidney disease-stage III, peptic ulcer disease, who presents with chest pain.  The patient is s/p CABG x 4 and per nursing has been coughing with intake.  Per nursing the patient was relatively independent prior to admission with not reported swallowing issues.  Current chest xray is showing stable mild-to-moderate congestive heart failure, stable small left pleural effusion and bibasilar lung opacities, left greater than right, slightly Subjective: pt alert, daughter says she is clearing her throat throughout the day since surgery Assessment / Plan / Recommendation CHL IP CLINICAL IMPRESSIONS 09/26/2015 Therapy Diagnosis Moderate oral phase dysphagia;Mild pharyngeal phase dysphagia;Mild oral phase dysphagia Clinical Impression Pt has a mild-moderate oropharyngeal dysphagia, although with overall visibility reduced due to shoulders/body habitus limiting view. She does have slow oral transit with premature spillage to the valleculae. Mild residue remained in the valleculae post-swallow with trace, shallow penetration observed x1 after the swallow with thin liquids. Pt cleared with cued throat clear. Although it was difficult to visualize the upper esophagus, there did appear to be backflow to the level of the UES x1. Throat clearing occurred throughout the duration of testing but was not correlated with airway compromise. Recommend to continue with current textures. Impact on safety and function Mild aspiration risk;Moderate aspiration risk   CHL IP TREATMENT RECOMMENDATION 09/26/2015 Treatment Recommendations Therapy as outlined in treatment plan below   Prognosis 09/26/2015 Prognosis for Safe Diet Advancement Good Barriers to Reach Goals  Cognitive deficits Barriers/Prognosis Comment -- CHL IP DIET RECOMMENDATION 09/26/2015 SLP Diet Recommendations Dysphagia 1 (Puree) solids;Nectar thick liquid Liquid Administration via Cup;Straw Medication Administration Crushed with puree Compensations Minimize environmental distractions;Slow rate;Small sips/bites Postural Changes Remain semi-upright after after feeds/meals (Comment);Seated upright at 90 degrees   CHL IP OTHER RECOMMENDATIONS 09/26/2015 Recommended Consults -- Oral Care Recommendations Oral care BID Other Recommendations Order thickener from pharmacy;Prohibited food (jello, ice cream, thin soups);Remove water pitcher   CHL IP FOLLOW UP RECOMMENDATIONS 09/26/2015 Follow up Recommendations Skilled Nursing facility   [facility] IP FREQUENCY AND DURATION 09/26/2015 Speech Therapy Frequency (ACUTE ONLY) min 2x/week Treatment Duration 2 weeks      CHL IP ORAL PHASE 09/26/2015 Oral Phase Impaired Oral - Pudding Teaspoon -- Oral - Pudding Cup -- Oral - Honey Teaspoon -- Oral - Honey Cup -- Oral - Nectar Teaspoon -- Oral - Nectar Cup Delayed oral transit;Premature spillage;Weak lingual manipulation Oral - Nectar Straw Delayed oral transit;Premature spillage;Weak lingual manipulation Oral - Thin Teaspoon -- Oral - Thin Cup Delayed oral transit;Premature spillage;Weak lingual manipulation Oral - Thin Straw -- Oral - Puree Delayed oral transit;Premature spillage;Weak lingual manipulation Oral - Mech Soft -- Oral - Regular -- Oral - Multi-Consistency -- Oral - Pill -- Oral Phase - Comment --  CHL IP PHARYNGEAL PHASE 09/26/2015 Pharyngeal Phase Impaired Pharyngeal- Pudding Teaspoon -- Pharyngeal -- Pharyngeal- Pudding Cup -- Pharyngeal -- Pharyngeal- Honey Teaspoon -- Pharyngeal -- Pharyngeal- Honey Cup -- Pharyngeal -- Pharyngeal- Nectar Teaspoon -- Pharyngeal -- Pharyngeal- Nectar Cup Delayed swallow initiation-vallecula;Reduced tongue base  retraction;Pharyngeal residue - valleculae Pharyngeal -- Pharyngeal- Nectar Straw  Delayed swallow initiation-vallecula;Reduced tongue base retraction;Pharyngeal residue - valleculae Pharyngeal -- Pharyngeal- Thin Teaspoon -- Pharyngeal -- Pharyngeal- Thin Cup Delayed swallow initiation-vallecula;Reduced tongue base retraction;Pharyngeal residue - valleculae;Penetration/Apiration after swallow Pharyngeal Material enters airway, remains ABOVE vocal cords and not ejected out Pharyngeal- Thin Straw -- Pharyngeal -- Pharyngeal- Puree Delayed swallow initiation-vallecula;Reduced tongue base retraction;Pharyngeal residue - valleculae Pharyngeal -- Pharyngeal- Mechanical Soft -- Pharyngeal -- Pharyngeal- Regular -- Pharyngeal -- Pharyngeal- Multi-consistency -- Pharyngeal -- Pharyngeal- Pill -- Pharyngeal -- Pharyngeal Comment --  CHL IP CERVICAL ESOPHAGEAL PHASE 09/26/2015 Cervical Esophageal Phase (No Data) Pudding Teaspoon -- Pudding Cup -- Honey Teaspoon -- Honey Cup -- Nectar Teaspoon -- Nectar Cup -- Nectar Straw -- Thin Teaspoon -- Thin Cup -- Thin Straw -- Puree -- Mechanical Soft -- Regular -- Multi-consistency -- Pill -- Cervical Esophageal Comment -- CHL IP GO 09/23/2015 Functional Assessment Tool Used ASHA NOMS and clinical judgment.   Functional Limitations Swallowing Swallow Current Status [id number]) CN Swallow Goal Status [id number]) CK Swallow Discharge Status [phone]) (None) Motor Speech Current Status [phone]) (None) Motor Speech Goal Status [phone]) (None) Motor Speech Goal Status [phone]) (None) Spoken Language Comprehension Current Status [phone]) (None) Spoken Language Comprehension Goal Status [id number]) (None) Spoken Language Comprehension Discharge Status [id number]) (None) Spoken Language Expression Current Status [phone]) (None) Spoken Language Expression Goal Status [phone]) (None) Spoken Language Expression Discharge Status [phone]) (None) Attention Current Status [id number]) (None) Attention Goal Status [id number]) (None) Attention Discharge Status [phone]) (None) Memory Current Status [id number]) (None) Memory  Goal Status [id number]) (None) Memory Discharge Status [id number]) (None) Voice Current Status [id number]) (None) Voice Goal Status [id number]) (None) Voice Discharge Status [id number]) (None) Other Speech-Language Pathology Functional Limitation [phone]) (None) Other Speech-Language Pathology Functional Limitation Goal Status [id number]) (None) Other Speech-Language Pathology Functional Limitation Discharge Status [phone]) (None) Germain Osgood, M.A. CCC-SLP [phone] Germain Osgood 09/26/2015, 3:05 PM               Labs: BMET  Recent Labs Lab 09/22/15 0547 09/22/15 1900 09/23/15 0421 09/24/15 0419 09/25/15 0417 09/26/15 0801 09/27/15 0400  NA 139 137 137 138 144 146* 144  K 4.5 5.1 4.9 4.5 4.1 3.7 3.3*  CL 102 103 102 102 103 102 102  CO2 23 23 23 23 26 29 31   GLUCOSE 79 200* 158* 139* 224* 163* 131*  BUN 44* 51* 55* 64* 74* 83* 69*  CREATININE 4.52* 4.60* 4.83* 4.20* 3.42* 2.89* 2.43*  CALCIUM 7.0* 6.9* 6.9* 7.3* 7.6* 7.8* 7.7*   CBC  Recent Labs Lab 09/24/15 0419 09/25/15 0417 09/26/15 0801 09/27/15 0400  WBC 11.9* 8.8 11.3* 13.9*  HGB 8.2* 7.8* 8.3* 8.3*  HCT 25.3* 24.3* 25.1* 24.9*  MCV 87.2 86.8 88.4 88.9  PLT 228 271 338 413*    Medications:    . antiseptic oral rinse  7 mL Mouth Rinse BID  . antiseptic oral rinse  7 mL Mouth Rinse QID  . aspirin EC  325 mg Oral Daily   Or  . aspirin  324 mg Per Tube Daily  . bisacodyl  10 mg Rectal Daily  . chlorhexidine gluconate  15 mL Mouth Rinse BID  . enoxaparin (LOVENOX) injection  30 mg Subcutaneous QHS  . feeding supplement (NEPRO CARB STEADY)  1,000 mL Per Tube Q24H  . feeding supplement (PRO-STAT SUGAR FREE 64)  30 mL Per Tube TID  . ferumoxytol  510 mg Intravenous Q72H  . furosemide  40 mg Intravenous Daily  .  insulin aspart  0-24 Units Subcutaneous 6 times per day  . metoprolol tartrate  12.5 mg Oral BID  . pantoprazole  40 mg Oral QHS  . sodium chloride flush  10 mL Intravenous Q12H  . sodium chloride flush  10 mL  Intravenous Q12H   Elmarie Shiley, MD 09/27/2015, 8:50 AM

## 2015-09-27 NOTE — Clinical Social Work Note (Signed)
Clinical Social Work Assessment  Patient Details  Name: Monica [name] MRN: [id number] Date of Birth: Sep 01, 1930  Date of referral:  09/27/15               Reason for consult:  Facility Placement                Permission sought to share information with:  Facility Sport and exercise psychologist, Family Supports Permission granted to share information::  Yes, Verbal Permission Granted  Name::      (daughter Monica [name] is main point of contact for family)  Agency::   ([facility] [place] )  Relationship::     Contact Information:     Housing/Transportation Living arrangements for the past 2 months:  Single Family Home Source of Information:  Adult Children (daughter Monica [name] ) Patient Interpreter Needed:  None Criminal Activity/Legal Involvement Pertinent to Current Situation/Hospitalization:  No - Comment as needed Significant Relationships:  Adult Children Lives with:  Adult Children (son lives with patient) Do you feel safe going back to the place where you live?  Yes Need for family participation in patient care:  No (Coment)  Care giving concerns:  Daughter Monica [name] reports being concerned regarding patient's confusion which is not baseline   Facilities manager / plan:  Patient is alert though not oriented at this time (only to self).  CSW contacted patient's daughter Monica [name] who reports that patient's daughter Monica [name] is the family's main contact at this time.  She can be reached at [phone].  CSW spoke with Monica [name] regarding placement.  Monica [name] has mixed emotions with patient being placed in a skilled facility as she states, she does not wish to have her mother "placed in a nursing home".  CSW addressed this concern and provided education regarding differences between STR and LTC.  Daughter was very receptive and agreeable to SNF placement at this time.  Daughter still wishes that patient will progress enough to be able to return home.  CSW explained that patient is a 2+ assist at this time  and SNF can be initiated to be another option if patient does not progress as quickly as the daughter hopes.  Daughter remains agreeable to referrals being sent out.  Daughter was given CSW contact information.  Daughter will follow-up with the other siblings and return a call to CSW as to which facilities the siblings would like for CSW to contact (if siblings have knowledge of facilities).  SNF list provided.  Daughter states that the patient lives in [place] and would possibly like to have placement close to patient's home, but will speak to the remaining siblings and provide CSW feedback.  Monica [name] states that patient is from home with son, but was completely independent prior to this admission.  Patient cooked, cleaned, cared for both home and yard and was driving independently prior to this admission.  The new onset of confusion is not baseline and has family concerned.  CSW offered support.    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  Patient/Family's Understanding of and Emotional Response to Diagnosis, Current Treatment, and Prognosis:  Family is realistic regarding level of care needed at time of discharge and remains realistic regarding patient's medical condition.  Emotional Assessment Appearance:  Appears stated age Attitude/Demeanor/Rapport:  Unable to Assess (patient continues  to be confused, but pleasant) Affect (typically observed):  Pleasant, Unable to Assess Orientation:  Oriented to Self Alcohol / Substance use:  Not Applicable Psych involvement (Current and /or in the community):  No (Comment)  Discharge Needs  Concerns to be addressed:  Cognitive Concerns Readmission within the last 30 days:  No Current discharge  risk:  None Barriers to Discharge:  Continued Medical Work up, [place], [place], LCSW 09/27/2015, 11:40 AM

## 2015-09-27 NOTE — Progress Notes (Signed)
Patient ID: Monica [name], female   DOB: [birth year], 82 y.o.   MRN: [id number] TCTS DAILY ICU PROGRESS NOTE                   [address]            [place] [address]          [phone]   8 Days Post-Op Procedure(s) (LRB): CORONARY ARTERY BYPASS GRAFTING (CABG) TIMES FOUR USING BILATARAL SAPHENOUS VEIN GRAFTS AND LEFT INTERNAL MAMMARY ARTERY (N/A) TRANSESOPHAGEAL ECHOCARDIOGRAM (TEE) (N/A)  Total Length of Stay:  LOS: 8 days   Subjective: Much more talkative today, singing, confusion improving  Objective: Vital signs in last 24 hours: Temp:  [97.9 F (36.6 C)-99.3 F (37.4 C)] 98.1 F (36.7 C) (03/23 0400) Pulse Rate:  [85-97] 85 (03/23 0612) Cardiac Rhythm:  [-] Normal sinus rhythm (03/22 2000) Resp:  [14-20] 16 (03/23 0612) BP: (108-163)/(47-128) 161/64 mmHg (03/23 0612) SpO2:  [92 %-100 %] 99 % (03/23 0612) FiO2 (%):  [40 %] 40 % (03/22 2028) Weight:  [180 lb 1.9 oz (81.7 kg)] 180 lb 1.9 oz (81.7 kg) (03/23 0416)  Filed Weights   09/25/15 0248 09/26/15 0400 09/27/15 0416  Weight: 184 lb 1.4 oz (83.5 kg) 183 lb 10.3 oz (83.3 kg) 180 lb 1.9 oz (81.7 kg)    Weight change: -3 lb 8.4 oz (-1.6 kg)   Hemodynamic parameters for last 24 hours:    Intake/Output from previous day: 03/22 0701 - 03/23 0700 In: 1920 [P.O.:420; I.V.:1500] Out: 1475 [Urine:1475]  Intake/Output this shift:    Current Meds: Scheduled Meds: . antiseptic oral rinse  7 mL Mouth Rinse BID  . antiseptic oral rinse  7 mL Mouth Rinse QID  . aspirin EC  325 mg Oral Daily   Or  . aspirin  324 mg Per Tube Daily  . bisacodyl  10 mg Rectal Daily  . chlorhexidine gluconate  15 mL Mouth Rinse BID  . enoxaparin (LOVENOX) injection  30 mg Subcutaneous QHS  . feeding supplement (NEPRO CARB STEADY)  1,000 mL Per Tube Q24H  . feeding supplement (PRO-STAT SUGAR FREE 64)  30 mL Per Tube TID  . ferumoxytol  510 mg Intravenous Q72H  . furosemide  40 mg Intravenous Daily  . insulin aspart   0-24 Units Subcutaneous 6 times per day  . metoprolol tartrate  12.5 mg Per Tube BID  . pantoprazole  40 mg Oral QHS  . sodium chloride flush  10 mL Intravenous Q12H  . sodium chloride flush  10 mL Intravenous Q12H   Continuous Infusions: . sodium chloride 250 mL (09/24/15 1813)  . dextrose 75 mL (09/27/15 0412)   PRN Meds:.levalbuterol, metoprolol, ondansetron (ZOFRAN) IV, RESOURCE THICKENUP CLEAR, sodium chloride flush  General appearance: cooperative and slowed mentation Neurologic: intact Heart: regular rate and rhythm, S1, S2 normal, no murmur, click, rub or gallop Lungs: diminished breath sounds bibasilar Abdomen: soft, non-tender; bowel sounds normal; no masses,  no organomegaly Extremities: extremities normal, atraumatic, no cyanosis or edema and Homans sign is negative, no sign of DVT Wound: sternum stable  Lab Results: CBC: Recent Labs  09/26/15 0801 09/27/15 0400  WBC 11.3* 13.9*  HGB 8.3* 8.3*  HCT 25.1* 24.9*  PLT 338 413*   BMET:  Recent Labs  09/26/15 0801 09/27/15 0400  NA 146* 144  K 3.7 3.3*  CL 102 102  CO2 29 31  GLUCOSE 163* 131*  BUN 83* 69*  CREATININE 2.89* 2.43*  CALCIUM 7.8* 7.7*    PT/INR: No results for input(s): LABPROT, INR in the last 72 hours. Radiology: Dg Swallowing Func-speech Pathology  09/26/2015  Objective Swallowing Evaluation: Type of Study: MBS-Modified Barium Swallow Study Patient Details Name: Monica [name] MRN: [id number] Date of Birth: 11/13/1930 Today's Date: 09/26/2015 Time: SLP Start Time (ACUTE ONLY): 1405-SLP Stop Time (ACUTE ONLY): 1422 SLP Time Calculation (min) (ACUTE ONLY): 17 min Past Medical History: Past Medical History Diagnosis Date . Peptic ulcer  . Mass  . Essential hypertension  . Hyperlipidemia  . Type 2 diabetes mellitus ([place])  . Asthma  . History of blood transfusion  . CKD (chronic kidney disease), stage III  Past Surgical History: Past Surgical History Procedure Laterality Date . Abdominal hysterectomy   .  Colon surgery   . Cardiac catheterization N/A 09/17/2015   Procedure: Left Heart Cath and Coronary Angiography;  Surgeon: Jettie Booze, MD;  Location: [facility];  Service: Cardiovascular;  Laterality: N/A; . Coronary artery bypass graft N/A 09/19/2015   Procedure: CORONARY ARTERY BYPASS GRAFTING (CABG) TIMES FOUR USING BILATARAL SAPHENOUS VEIN GRAFTS AND LEFT INTERNAL MAMMARY ARTERY;  Surgeon: [name], MD;  Location: [facility];  Service: Open Heart Surgery;  Laterality: N/A; . Tee without cardioversion N/A 09/19/2015   Procedure: TRANSESOPHAGEAL ECHOCARDIOGRAM (TEE);  Surgeon: [name], MD;  Location: [facility];  Service: Open Heart Surgery;  Laterality: N/A; HPI: Monica [name] is a 82 y.o. female with PMH of hypertension, hyperlipidemia, diabetes mellitus, asthma, GERD, gout, chronic kidney disease-stage III, peptic ulcer disease, who presents with chest pain.  The patient is s/p CABG x 4 and per nursing has been coughing with intake.  Per nursing the patient was relatively independent prior to admission with not reported swallowing issues.  Current chest xray is showing stable mild-to-moderate congestive heart failure, stable small left pleural effusion and bibasilar lung opacities, left greater than right, slightly Subjective: pt alert, daughter says she is clearing her throat throughout the day since surgery Assessment / Plan / Recommendation CHL IP CLINICAL IMPRESSIONS 09/26/2015 Therapy Diagnosis Moderate oral phase dysphagia;Mild pharyngeal phase dysphagia;Mild oral phase dysphagia Clinical Impression Pt has a mild-moderate oropharyngeal dysphagia, although with overall visibility reduced due to shoulders/body habitus limiting view. She does have slow oral transit with premature spillage to the valleculae. Mild residue remained in the valleculae post-swallow with trace, shallow penetration observed x1 after the swallow with thin liquids. Pt cleared with cued throat clear. Although it was  difficult to visualize the upper esophagus, there did appear to be backflow to the level of the UES x1. Throat clearing occurred throughout the duration of testing but was not correlated with airway compromise. Recommend to continue with current textures. Impact on safety and function Mild aspiration risk;Moderate aspiration risk   CHL IP TREATMENT RECOMMENDATION 09/26/2015 Treatment Recommendations Therapy as outlined in treatment plan below   Prognosis 09/26/2015 Prognosis for Safe Diet Advancement Good Barriers to Reach Goals Cognitive deficits Barriers/Prognosis Comment -- CHL IP DIET RECOMMENDATION 09/26/2015 SLP Diet Recommendations Dysphagia 1 (Puree) solids;Nectar thick liquid Liquid Administration via Cup;Straw Medication Administration Crushed with puree Compensations Minimize environmental distractions;Slow rate;Small sips/bites Postural Changes Remain semi-upright after after feeds/meals (Comment);Seated upright at 90 degrees   CHL IP OTHER RECOMMENDATIONS 09/26/2015 Recommended Consults -- Oral Care Recommendations Oral care BID Other Recommendations Order thickener from pharmacy;Prohibited food (jello, ice cream, thin soups);Remove water pitcher   CHL IP FOLLOW UP RECOMMENDATIONS 09/26/2015 Follow up Recommendations Skilled Nursing facility   [facility]  IP FREQUENCY AND DURATION 09/26/2015 Speech Therapy Frequency (ACUTE ONLY) min 2x/week Treatment Duration 2 weeks      CHL IP ORAL PHASE 09/26/2015 Oral Phase Impaired Oral - Pudding Teaspoon -- Oral - Pudding Cup -- Oral - Honey Teaspoon -- Oral - Honey Cup -- Oral - Nectar Teaspoon -- Oral - Nectar Cup Delayed oral transit;Premature spillage;Weak lingual manipulation Oral - Nectar Straw Delayed oral transit;Premature spillage;Weak lingual manipulation Oral - Thin Teaspoon -- Oral - Thin Cup Delayed oral transit;Premature spillage;Weak lingual manipulation Oral - Thin Straw -- Oral - Puree Delayed oral transit;Premature spillage;Weak lingual manipulation Oral - Mech  Soft -- Oral - Regular -- Oral - Multi-Consistency -- Oral - Pill -- Oral Phase - Comment --  CHL IP PHARYNGEAL PHASE 09/26/2015 Pharyngeal Phase Impaired Pharyngeal- Pudding Teaspoon -- Pharyngeal -- Pharyngeal- Pudding Cup -- Pharyngeal -- Pharyngeal- Honey Teaspoon -- Pharyngeal -- Pharyngeal- Honey Cup -- Pharyngeal -- Pharyngeal- Nectar Teaspoon -- Pharyngeal -- Pharyngeal- Nectar Cup Delayed swallow initiation-vallecula;Reduced tongue base retraction;Pharyngeal residue - valleculae Pharyngeal -- Pharyngeal- Nectar Straw Delayed swallow initiation-vallecula;Reduced tongue base retraction;Pharyngeal residue - valleculae Pharyngeal -- Pharyngeal- Thin Teaspoon -- Pharyngeal -- Pharyngeal- Thin Cup Delayed swallow initiation-vallecula;Reduced tongue base retraction;Pharyngeal residue - valleculae;Penetration/Apiration after swallow Pharyngeal Material enters airway, remains ABOVE vocal cords and not ejected out Pharyngeal- Thin Straw -- Pharyngeal -- Pharyngeal- Puree Delayed swallow initiation-vallecula;Reduced tongue base retraction;Pharyngeal residue - valleculae Pharyngeal -- Pharyngeal- Mechanical Soft -- Pharyngeal -- Pharyngeal- Regular -- Pharyngeal -- Pharyngeal- Multi-consistency -- Pharyngeal -- Pharyngeal- Pill -- Pharyngeal -- Pharyngeal Comment --  CHL IP CERVICAL ESOPHAGEAL PHASE 09/26/2015 Cervical Esophageal Phase (No Data) Pudding Teaspoon -- Pudding Cup -- Honey Teaspoon -- Honey Cup -- Nectar Teaspoon -- Nectar Cup -- Nectar Straw -- Thin Teaspoon -- Thin Cup -- Thin Straw -- Puree -- Mechanical Soft -- Regular -- Multi-consistency -- Pill -- Cervical Esophageal Comment -- CHL IP GO 09/23/2015 Functional Assessment Tool Used ASHA NOMS and clinical judgment.   Functional Limitations Swallowing Swallow Current Status [phone]) CN Swallow Goal Status [id number]) CK Swallow Discharge Status [phone]) (None) Motor Speech Current Status [phone]) (None) Motor Speech Goal Status [phone]) (None) Motor Speech Goal  Status [phone]) (None) Spoken Language Comprehension Current Status [phone]) (None) Spoken Language Comprehension Goal Status [id number]) (None) Spoken Language Comprehension Discharge Status [phone]) (None) Spoken Language Expression Current Status [phone]) (None) Spoken Language Expression Goal Status [phone]) (None) Spoken Language Expression Discharge Status [phone]) (None) Attention Current Status [id number]) (None) Attention Goal Status [id number]) (None) Attention Discharge Status [phone]) (None) Memory Current Status [id number]) (None) Memory Goal Status [id number]) (None) Memory Discharge Status [id number]) (None) Voice Current Status [id number]) (None) Voice Goal Status [id number]) (None) Voice Discharge Status [id number]) (None) Other Speech-Language Pathology Functional Limitation [phone]) (None) Other Speech-Language Pathology Functional Limitation Goal Status [id number]) (None) Other Speech-Language Pathology Functional Limitation Discharge Status [phone]) (None) Germain Osgood, M.A. CCC-SLP [phone] Germain Osgood 09/26/2015, 3:05 PM                Assessment/Plan: S/P Procedure(s) (LRB): CORONARY ARTERY BYPASS GRAFTING (CABG) TIMES FOUR USING BILATARAL SAPHENOUS VEIN GRAFTS AND LEFT INTERNAL MAMMARY ARTERY (N/A) TRANSESOPHAGEAL ECHOCARDIOGRAM (TEE) (N/A) Mobilize Diabetes control Increase PT activity D/c foley Renal function improving      [name] 09/27/2015 8:06 AM

## 2015-09-27 NOTE — Progress Notes (Signed)
Patient ID: Monica [name], female   DOB: [birth year], 82 y.o.   MRN: [id number] EVENING ROUNDS NOTE :     [address]       [place] [address]             [phone]                 8 Days Post-Op Procedure(s) (LRB): CORONARY ARTERY BYPASS GRAFTING (CABG) TIMES FOUR USING BILATARAL SAPHENOUS VEIN GRAFTS AND LEFT INTERNAL MAMMARY ARTERY (N/A) TRANSESOPHAGEAL ECHOCARDIOGRAM (TEE) (N/A)  Total Length of Stay:  LOS: 8 days  BP 165/66 mmHg  Pulse 102  Temp(Src) 98.2 F (36.8 C) (Oral)  Resp 17  Ht 5\' 2"  (1.575 m)  Wt 180 lb 1.9 oz (81.7 kg)  BMI 32.94 kg/m2  SpO2 100%  .Intake/Output      03/22 0701 - 03/23 0700 03/23 0701 - 03/24 0700   P.O. 420 720   I.V. (mL/kg) 1500 (18.4) 112.5 (1.4)   NG/GT     Total Intake(mL/kg) 1920 (23.5) 832.5 (10.2)   Urine (mL/kg/hr) 1475 (0.8) 1225 (1.4)   Total Output 1475 1225   Net +445 -392.5          . sodium chloride 250 mL (09/24/15 1813)     Lab Results  Component Value Date   WBC 13.9* 09/27/2015   HGB 8.3* 09/27/2015   HCT 24.9* 09/27/2015   PLT 413* 09/27/2015   GLUCOSE 131* 09/27/2015   CHOL 139 09/15/2015   TRIG 119 09/15/2015   HDL 35* 09/15/2015   LDLCALC 80 09/15/2015   ALT 44 09/25/2015   AST 58* 09/25/2015   NA 144 09/27/2015   K 3.3* 09/27/2015   CL 102 09/27/2015   CREATININE 2.43* 09/27/2015   BUN 69* 09/27/2015   CO2 31 09/27/2015   TSH 2.184 09/25/2015   INR 1.53* 09/19/2015   HGBA1C 8.2* 09/15/2015   Slowly improving Renal function better, close to baseline ckd  [name] MD  Beeper [phone] Office [phone] 09/27/2015 5:53 PM

## 2015-09-28 ENCOUNTER — Inpatient Hospital Stay (HOSPITAL_COMMUNITY): Payer: Medicare HMO

## 2015-09-28 LAB — BASIC METABOLIC PANEL
Anion gap: 9 (ref 5–15)
BUN: 61 mg/dL — ABNORMAL HIGH (ref 6–20)
CO2: 33 mmol/L — ABNORMAL HIGH (ref 22–32)
Calcium: 7.7 mg/dL — ABNORMAL LOW (ref 8.9–10.3)
Chloride: 102 mmol/L (ref 101–111)
Creatinine, Ser: 2.29 mg/dL — ABNORMAL HIGH (ref 0.44–1.00)
GFR calc Af Amer: 22 mL/min — ABNORMAL LOW (ref >60)
GFR calc non Af Amer: 19 mL/min — ABNORMAL LOW (ref >60)
Glucose, Bld: 140 mg/dL — ABNORMAL HIGH (ref 65–99)
Potassium: 4.1 mmol/L (ref 3.5–5.1)
Sodium: 144 mmol/L (ref 135–145)

## 2015-09-28 LAB — GLUCOSE, CAPILLARY
GLUCOSE-CAPILLARY: 133 mg/dL — AB (ref 65–99)
Glucose-Capillary: 116 mg/dL — ABNORMAL HIGH (ref 65–99)
Glucose-Capillary: 186 mg/dL — ABNORMAL HIGH (ref 65–99)
Glucose-Capillary: 199 mg/dL — ABNORMAL HIGH (ref 65–99)
Glucose-Capillary: 225 mg/dL — ABNORMAL HIGH (ref 65–99)
Glucose-Capillary: 260 mg/dL — ABNORMAL HIGH (ref 65–99)

## 2015-09-28 LAB — CBC
HCT: 26 % — ABNORMAL LOW (ref 36.0–46.0)
Hemoglobin: 8.1 g/dL — ABNORMAL LOW (ref 12.0–15.0)
MCH: 28.1 pg (ref 26.0–34.0)
MCHC: 31.2 g/dL (ref 30.0–36.0)
MCV: 90.3 fL (ref 78.0–100.0)
Platelets: 474 10*3/uL — ABNORMAL HIGH (ref 150–400)
RBC: 2.88 MIL/uL — ABNORMAL LOW (ref 3.87–5.11)
RDW: 14.6 % (ref 11.5–15.5)
WBC: 13 10*3/uL — ABNORMAL HIGH (ref 4.0–10.5)

## 2015-09-28 MED ORDER — ATORVASTATIN CALCIUM 40 MG PO TABS
40.0000 mg | ORAL_TABLET | Freq: Every day | ORAL | Status: DC
  Administered 2015-09-28 – 2015-10-02 (×5): 40 mg via ORAL
  Filled 2015-09-28 (×6): qty 1

## 2015-09-28 MED ORDER — SODIUM CHLORIDE 0.9% FLUSH
10.0000 mL | INTRAVENOUS | Status: DC | PRN
  Administered 2015-09-30 – 2015-10-03 (×3): 10 mL
  Filled 2015-09-28 (×3): qty 40

## 2015-09-28 MED ORDER — ACETAMINOPHEN 325 MG PO TABS: 650.0000 mg | ORAL_TABLET | Freq: Four times a day (QID) | ORAL | Status: DC | PRN

## 2015-09-28 MED ORDER — ENSURE ENLIVE PO LIQD
237.0000 mL | Freq: Two times a day (BID) | ORAL | Status: DC
  Administered 2015-09-30 – 2015-10-01 (×2): 237 mL via ORAL

## 2015-09-28 MED ORDER — TRAMADOL HCL 50 MG PO TABS
50.0000 mg | ORAL_TABLET | Freq: Two times a day (BID) | ORAL | Status: DC | PRN
  Administered 2015-10-03: 50 mg via ORAL
  Filled 2015-09-28: qty 1

## 2015-09-28 MED ORDER — INSULIN DETEMIR 100 UNIT/ML ~~LOC~~ SOLN
15.0000 [IU] | Freq: Every day | SUBCUTANEOUS | Status: DC
  Administered 2015-09-28 – 2015-09-29 (×2): 15 [IU] via SUBCUTANEOUS
  Filled 2015-09-28 (×2): qty 0.15

## 2015-09-28 MED ORDER — SODIUM CHLORIDE 0.9% FLUSH
10.0000 mL | Freq: Two times a day (BID) | INTRAVENOUS | Status: DC
  Administered 2015-09-30: 10 mL
  Administered 2015-10-01: 20 mL
  Administered 2015-10-02 – 2015-10-03 (×2): 10 mL

## 2015-09-28 MED ORDER — ONDANSETRON HCL 4 MG/2ML IJ SOLN: 4.0000 mg | Freq: Four times a day (QID) | INTRAMUSCULAR | Status: DC | PRN

## 2015-09-28 MED ORDER — INSULIN ASPART 100 UNIT/ML ~~LOC~~ SOLN
0.0000 [IU] | Freq: Three times a day (TID) | SUBCUTANEOUS | Status: DC
  Administered 2015-09-28: 12 [IU] via SUBCUTANEOUS
  Administered 2015-09-28: 8 [IU] via SUBCUTANEOUS
  Administered 2015-09-28: 2 [IU] via SUBCUTANEOUS
  Administered 2015-09-29: 12 [IU] via SUBCUTANEOUS
  Administered 2015-09-29 (×3): 4 [IU] via SUBCUTANEOUS
  Administered 2015-09-30: 12 [IU] via SUBCUTANEOUS
  Administered 2015-09-30: 8 [IU] via SUBCUTANEOUS
  Administered 2015-09-30: 4 [IU] via SUBCUTANEOUS
  Administered 2015-10-01: 16 [IU] via SUBCUTANEOUS
  Administered 2015-10-01 – 2015-10-02 (×3): 2 [IU] via SUBCUTANEOUS
  Administered 2015-10-02: 12 [IU] via SUBCUTANEOUS
  Administered 2015-10-02: 2 [IU] via SUBCUTANEOUS
  Administered 2015-10-02: 12 [IU] via SUBCUTANEOUS
  Administered 2015-10-03: 2 [IU] via SUBCUTANEOUS

## 2015-09-28 MED ORDER — METOPROLOL TARTRATE 12.5 MG HALF TABLET
12.5000 mg | ORAL_TABLET | Freq: Two times a day (BID) | ORAL | Status: DC
  Administered 2015-09-28 – 2015-09-30 (×6): 12.5 mg via ORAL
  Filled 2015-09-28 (×6): qty 1

## 2015-09-28 MED ORDER — BISACODYL 10 MG RE SUPP: 10.0000 mg | Freq: Every day | RECTAL | Status: DC | PRN

## 2015-09-28 MED ORDER — ASPIRIN EC 81 MG PO TBEC
81.0000 mg | DELAYED_RELEASE_TABLET | Freq: Every day | ORAL | Status: DC
  Administered 2015-09-28 – 2015-10-03 (×6): 81 mg via ORAL
  Filled 2015-09-28 (×6): qty 1

## 2015-09-28 MED ORDER — SODIUM CHLORIDE 0.9% FLUSH: 3.0000 mL | INTRAVENOUS | Status: DC | PRN

## 2015-09-28 MED ORDER — MOVING RIGHT ALONG BOOK
Freq: Once | Status: DC
  Filled 2015-09-28: qty 1

## 2015-09-28 MED ORDER — BISACODYL 5 MG PO TBEC: 10.0000 mg | DELAYED_RELEASE_TABLET | Freq: Every day | ORAL | Status: DC | PRN

## 2015-09-28 MED ORDER — ONDANSETRON HCL 4 MG PO TABS
4.0000 mg | ORAL_TABLET | Freq: Four times a day (QID) | ORAL | Status: DC | PRN
Start: 2015-09-28 — End: 2015-10-03

## 2015-09-28 MED ORDER — SODIUM CHLORIDE 0.9 % IV SOLN: 250.0000 mL | INTRAVENOUS | Status: DC | PRN

## 2015-09-28 MED ORDER — PANTOPRAZOLE SODIUM 40 MG PO TBEC
40.0000 mg | DELAYED_RELEASE_TABLET | Freq: Every day | ORAL | Status: DC
  Administered 2015-09-28 – 2015-10-03 (×5): 40 mg via ORAL
  Filled 2015-09-28 (×6): qty 1

## 2015-09-28 MED ORDER — SODIUM CHLORIDE 0.9% FLUSH
3.0000 mL | Freq: Two times a day (BID) | INTRAVENOUS | Status: DC
  Administered 2015-09-28 – 2015-10-02 (×3): 3 mL via INTRAVENOUS

## 2015-09-28 NOTE — Progress Notes (Signed)
Physical Therapy Treatment Patient Details Name: Monica [name] MRN: [id number] DOB: 1930/12/06 Today's Date: 09/28/2015    History of Present Illness Monica [name] is a 82 y.o. female with PMH of hypertension, hyperlipidemia, diabetes mellitus, asthma, GERD, gout, chronic kidney disease-stage III, peptic ulcer disease, who presents with chest pain. For cardiac cath 09/17/2015; underwent CABG x 4 on 09/19/15    PT Comments    Patient progressing well towards PT goals. Continues to require max cues to adhere to sternal precautions and not use UEs during mobility. Pt less confused today but still calling her son her grandson. Improved ambulation distance today. Will continue to follow and progress as tolerated.   Follow Up Recommendations  SNF     Equipment Recommendations  None recommended by PT    Recommendations for Other Services       Precautions / Restrictions Precautions Precautions: Fall;Sternal Restrictions Weight Bearing Restrictions: Yes (sternal precautions)    Mobility  Bed Mobility Overal bed mobility: Needs Assistance Bed Mobility: Supine to Sit     Supine to sit: Mod assist;+2 for physical assistance;HOB elevated     General bed mobility comments: Assist to elevate trunk and scoot bottom to EOB. Despite cues not to use UEs for transfer, pt using them.   Transfers Overall transfer level: Needs assistance Equipment used:  (w/c) Transfers: Sit to/from Stand Sit to Stand: Min assist;+2 physical assistance         General transfer comment: Assist of 2 to stand from EOB with cues for hand placement/technique. Encourage pt to hold pillow but despite max cues, pt using arms to stand. Stood from [employer], from chair x1.   Ambulation/Gait Ambulation/Gait assistance: Min assist Ambulation Distance (Feet): 60 Feet (+ 50') Assistive device:  (pushing w/c) Gait Pattern/deviations: Step-through pattern;Decreased stride length Gait velocity: decreased   General Gait  Details: Slow, unsteady gait with 2/4 DOE. Sp02 dropped to mid 80s on RA. Cues for pursed lip breathing. 1 seated rest break.    Stairs            Wheelchair Mobility    Modified Rankin (Stroke Patients Only)       Balance Overall balance assessment: Needs assistance Sitting-balance support: Feet supported;No upper extremity supported Sitting balance-Leahy Scale: Fair     Standing balance support: During functional activity Standing balance-Leahy Scale: Poor Standing balance comment: Reliant on BUes for support.                     Cognition Arousal/Alertness: Awake/alert Behavior During Therapy: WFL for tasks assessed/performed Overall Cognitive Status: Impaired/Different from baseline   Orientation Level: Disoriented to;Place (Able to state 3rd month correctly today, 1970)   Memory: Decreased short-term memory         General Comments: Continues to be confused. not able to state who was in the room with her, (her oldest son) but able to state why she was in the hospital, "heart surgery."    Exercises      General Comments General comments (skin integrity, edema, etc.): pt's son present in room during session.      Pertinent Vitals/Pain Pain Assessment: No/denies pain    Home Living                      Prior Function            PT Goals (current goals can now be found in the care plan section) Progress towards PT goals: Progressing toward goals  Frequency  Min 3X/week    PT Plan Current plan remains appropriate    Co-evaluation             End of Session Equipment Utilized During Treatment: Gait belt Activity Tolerance: Treatment limited secondary to medical complications (Comment);Patient tolerated treatment well (drop in Sp02 on RA) Patient left: in chair;with call bell/phone within reach;with family/visitor present     Time: 1103-1130 PT Time Calculation (min) (ACUTE ONLY): 27 min  Charges:  $Gait Training:  23-37 mins                    G Codes:      [place] 09/28/2015, 1:34 PM Wray Kearns, [place], DPT [phone]

## 2015-09-28 NOTE — Care Management Important Message (Signed)
Important Message  Patient Details  Name: Monica [name] MRN: [id number] Date of Birth: Jul 13, 1930   Medicare Important Message Given:  Yes    Newell Frater P Sayaka Hoeppner 09/28/2015, 12:30 PM

## 2015-09-28 NOTE — Progress Notes (Signed)
Pt has arrived to 2W. Telemetry box has been applied and CCMD has been notified. Vitals are stable. Pt is resting in the chair with family in room. Pt reports no pain at this time. Will continue to monitor.   Grant Fontana RN, BSN

## 2015-09-28 NOTE — Progress Notes (Signed)
Call attempt x1 to transfer pt to 2W

## 2015-09-28 NOTE — NC FL2 (Signed)
[place] MEDICAID FL2 LEVEL OF CARE SCREENING TOOL     IDENTIFICATION  Patient Name: Monica [name] Birthdate: 1931/04/16 Sex: female Admission Date (Current Location): 09/14/2015  [facility] and [place] Number:  Herbalist and Address:  [facility], [place] [address] [place], [place] [address]      Provider Number: [id number]  Attending Physician Name and Address:  [name], MD  Relative Name and Phone Number:  [name], daughter, [phone]    Current Level of Care: Hospital Recommended Level of Care: [place] Prior Approval Number:    Date Approved/Denied:   PASRR Number: [id number]  Discharge Plan: SNF    Current Diagnoses: Patient Active Problem List   Diagnosis Date Noted  . Altered mental status   . S/P CABG x 4 09/19/2015  . Unstable angina ([place]) 09/17/2015  . Abnormal finding on EKG - dynamic changes 09/17/2015  . Pain in the chest   . Essential hypertension   . GERD (gastroesophageal reflux disease) 09/15/2015  . Gout 09/15/2015  . Chest pain 09/15/2015  . Accelerated hypertension   . Hyperlipidemia   . Diabetes mellitus with renal complications ([place])   . Asthma   . CKD (chronic kidney disease), stage III   . Metatarsalgia of both feet 07/25/2014  . Equinus deformity of foot, acquired 07/25/2014  . Onychomycosis 07/25/2014  . Pain in lower limb 07/25/2014    Orientation RESPIRATION BLADDER Height & Weight     Self, Time  Normal Continent Weight: 81.5 kg (179 lb 10.8 oz) Height:  5\' 2"  (157.5 cm)  BEHAVIORAL SYMPTOMS/MOOD NEUROLOGICAL BOWEL NUTRITION STATUS      Continent  (Please see DC summary)  AMBULATORY STATUS COMMUNICATION OF NEEDS Skin     Verbally Surgical wounds (Closed incision on legs and chest)                       Personal Care Assistance Level of Assistance  Bathing, Feeding, Dressing Bathing Assistance: Limited assistance Feeding assistance: Independent Dressing  Assistance: Limited assistance     Functional Limitations Info             SPECIAL CARE FACTORS FREQUENCY  PT (By licensed PT)     PT Frequency: min 3x/week              Contractures      Additional Factors Info  Code Status, Allergies, Insulin Sliding Scale Code Status Info: Full Allergies Info: NKA   Insulin Sliding Scale Info: insulin aspart (novoLOG) injection 0-24 Units;insulin detemir (LEVEMIR) injection 15 Units       Current Medications (09/28/2015):  This is the current hospital active medication list Current Facility-Administered Medications  Medication Dose Route Frequency Provider Last Rate Last Dose  . 0.9 %  sodium chloride infusion  250 mL Intravenous PRN [name], MD      . acetaminophen (TYLENOL) tablet 650 mg  650 mg Oral Q6H PRN [name], MD      . aspirin EC tablet 81 mg  81 mg Oral Daily [name], MD   81 mg at 09/28/15 1031  . atorvastatin (LIPITOR) tablet 40 mg  40 mg Oral q1800 [name], MD      . bisacodyl (DULCOLAX) EC tablet 10 mg  10 mg Oral Daily PRN [name], MD       Or  . bisacodyl (DULCOLAX) suppository 10 mg  10 mg Rectal Daily PRN Percell Miller  Maryruth Bun, MD      . enoxaparin (LOVENOX) injection 30 mg  30 mg Subcutaneous QHS [name], MD   30 mg at 09/27/15 2135  . feeding supplement (ENSURE ENLIVE) (ENSURE ENLIVE) liquid 237 mL  237 mL Oral BID BM [name], MD      . furosemide (LASIX) injection 40 mg  40 mg Intravenous Daily Elmarie Shiley, MD   40 mg at 09/28/15 1031  . insulin aspart (novoLOG) injection 0-24 Units  0-24 Units Subcutaneous TID AC & HS [name], MD   12 Units at 09/28/15 1218  . insulin detemir (LEVEMIR) injection 15 Units  15 Units Subcutaneous Daily [name], MD   15 Units at 09/28/15 1031  . levalbuterol (XOPENEX) nebulizer solution 0.63 mg  0.63 mg Nebulization Q6H PRN [name], MD      . metoprolol tartrate (LOPRESSOR) tablet 12.5 mg   12.5 mg Oral BID [name], MD   12.5 mg at 09/28/15 1030  . moving right along book   Does not apply Once [name], MD      . ondansetron [facility]) tablet 4 mg  4 mg Oral Q6H PRN [name], MD       Or  . ondansetron [facility]) injection 4 mg  4 mg Intravenous Q6H PRN [name], MD      . pantoprazole (PROTONIX) EC tablet 40 mg  40 mg Oral QAC breakfast [name], MD   40 mg at 09/28/15 0834  . RESOURCE THICKENUP CLEAR   Oral PRN [name], MD      . sodium chloride flush (NS) 0.9 % injection 3 mL  3 mL Intravenous Q12H [name], MD   3 mL at 09/28/15 1000  . sodium chloride flush (NS) 0.9 % injection 3 mL  3 mL Intravenous PRN [name], MD      . traMADol Veatrice Bourbon) tablet 50 mg  50 mg Oral Q12H PRN [name], MD         Discharge Medications: Please see discharge summary for a list of discharge medications.  Relevant Imaging Results:  Relevant Lab Results:   Additional Information SSN: [place], [place]

## 2015-09-28 NOTE — Clinical Social Work Note (Signed)
Monica [name] first choice and second choice: [facility]  [name], [place] [phone]  [id number]; 2S 15-16 and [place] Licensed Clinical Social Worker

## 2015-09-28 NOTE — Clinical Social Work Placement (Signed)
   CLINICAL SOCIAL WORK PLACEMENT  NOTE  Date:  09/28/2015  Patient Details  Name: Monica [name] MRN: [id number] Date of Birth: [birth year]  Clinical Social Work is seeking post-discharge placement for this patient at the [place] level of care (*CSW will initial, date and re-position this form in  chart as items are completed):      Patient/family provided with [facility] Work Department's list of facilities offering this level of care within the geographic area requested by the patient (or if unable, by the patient's family).      Patient/family informed of their freedom to choose among providers that offer the needed level of care, that participate in Medicare, Medicaid or managed care program needed by the patient, have an available bed and are willing to accept the patient.      Patient/family informed of [facility]'s ownership interest in [facility] and [facility], as well as of the fact that they are under no obligation to receive care at these facilities.  PASRR submitted to EDS on 09/28/15     PASRR number received on 09/28/15     Existing PASRR number confirmed on       FL2 transmitted to all facilities in geographic area requested by pt/family on 09/28/15     FL2 transmitted to all facilities within larger geographic area on       Patient informed that his/her managed care company has contracts with or will negotiate with certain facilities, including the following:            Patient/family informed of bed offers received.  Patient chooses bed at       Physician recommends and patient chooses bed at      Patient to be transferred to   on  .  Patient to be transferred to facility by       Patient family notified on   of transfer.  Name of family member notified:        PHYSICIAN       Additional Comment:    _______________________________________________ Benard Halsted, [place] 09/28/2015, 2:38 PM

## 2015-09-28 NOTE — Progress Notes (Signed)
Nutrition Follow-up  DOCUMENTATION CODES:   Obesity unspecified  INTERVENTION:   Ensure Enlive po BID, each supplement provides 350 kcal and 20 grams of protein  NUTRITION DIAGNOSIS:   Inadequate oral intake related to poor appetite as evidenced by meal completion < 50%. Ongoing.   GOAL:   Patient will meet greater than or equal to 90% of their needs Progressing  MONITOR:   PO intake, Supplement acceptance, I & O's, Labs  ASSESSMENT:   82 y.o. female with PMH of HTN, hyperlipidemia, DM, asthma, GERD, gout, chronic kidney disease-stage III, peptic ulcer disease, who presents with chest pain.  Pt reports that she has been having intermittent chest pain for about 2 months. The chest pain happens 2 or 3 times per week for 20-30 mins each time. She states that her chest pain is exertional, walking make her chest pain worse. Patient does not have abdominal pain, diarrhea, symptoms of UTI, nausea, vomiting, unilateral weakness.  3/21: able to advance to Dysphagia 1 diet with Nectar-thickened liquids Meal Completion: 20-50% Pt up in chair and states that she is eating some. Not much appetite. Will try supplements.  Labs reviewed: CBG's: 116-186  Diet Order:  DIET - DYS 1 Room service appropriate?: Yes; Fluid consistency:: Nectar Thick  Skin:  Wound (see comment) (MASD right breast, closed chest and bilateral leg incision)  Last BM:  3/21  Height:   Ht Readings from Last 1 Encounters:  09/27/15 5\' 2"  (1.575 m)    Weight:   Wt Readings from Last 1 Encounters:  09/28/15 179 lb 10.8 oz (81.5 kg)    Ideal Body Weight:  50 kg  BMI:  Body mass index is 32.85 kg/(m^2).  Estimated Nutritional Needs:   Kcal:  1600-1800  Protein:  95-105 grams  Fluid:  per MD  EDUCATION NEEDS:   No education needs identified at this time  [place], [place], [place] Pager [phone] After Hours Pager

## 2015-09-28 NOTE — Progress Notes (Signed)
Patient ID: Monica [name], female   DOB: 08/29/1930, 82 y.o.   MRN: [id number] TCTS DAILY ICU PROGRESS NOTE                   [address]            [place] [address]          [phone]   9 Days Post-Op Procedure(s) (LRB): CORONARY ARTERY BYPASS GRAFTING (CABG) TIMES FOUR USING BILATARAL SAPHENOUS VEIN GRAFTS AND LEFT INTERNAL MAMMARY ARTERY (N/A) TRANSESOPHAGEAL ECHOCARDIOGRAM (TEE) (N/A)  Total Length of Stay:  LOS: 9 days   Subjective: Less confused this am, answers questions appropriately  Objective: Vital signs in last 24 hours: Temp:  [97.6 F (36.4 C)-98.2 F (36.8 C)] 97.6 F (36.4 C) (03/24 0400) Pulse Rate:  [78-110] 78 (03/24 0600) Cardiac Rhythm:  [-] Normal sinus rhythm (03/24 0400) Resp:  [10-23] 12 (03/24 0600) BP: (117-170)/(58-133) 136/62 mmHg (03/24 0600) SpO2:  [94 %-100 %] 100 % (03/24 0600) Weight:  [179 lb 10.8 oz (81.5 kg)] 179 lb 10.8 oz (81.5 kg) (03/24 0600)  Filed Weights   09/26/15 0400 09/27/15 0416 09/28/15 0600  Weight: 183 lb 10.3 oz (83.3 kg) 180 lb 1.9 oz (81.7 kg) 179 lb 10.8 oz (81.5 kg)    Weight change: -7.1 oz (-0.2 kg)   Hemodynamic parameters for last 24 hours:    Intake/Output from previous day: 03/23 0701 - 03/24 0700 In: 1072.5 [P.O.:960; I.V.:112.5] Out: 1225 [Urine:1225]  Intake/Output this shift:    Current Meds: Scheduled Meds: . antiseptic oral rinse  7 mL Mouth Rinse BID  . antiseptic oral rinse  7 mL Mouth Rinse QID  . aspirin EC  325 mg Oral Daily   Or  . aspirin  324 mg Per Tube Daily  . bisacodyl  10 mg Rectal Daily  . chlorhexidine gluconate (SAGE KIT)  15 mL Mouth Rinse BID  . enoxaparin (LOVENOX) injection  30 mg Subcutaneous QHS  . feeding supplement (NEPRO CARB STEADY)  1,000 mL Per Tube Q24H  . feeding supplement (PRO-STAT SUGAR FREE 64)  30 mL Per Tube TID  . ferumoxytol  510 mg Intravenous Q72H  . furosemide  40 mg Intravenous Daily  . insulin aspart  0-24 Units Subcutaneous 6  times per day  . metoprolol tartrate  12.5 mg Oral BID  . pantoprazole  40 mg Oral QHS  . sodium chloride flush  10 mL Intravenous Q12H  . sodium chloride flush  10 mL Intravenous Q12H   Continuous Infusions: . sodium chloride 250 mL (09/24/15 1813)   PRN Meds:.levalbuterol, metoprolol, ondansetron (ZOFRAN) IV, RESOURCE THICKENUP CLEAR, sodium chloride flush  General appearance: alert, cooperative and slowed mentation Neurologic: intact Heart: regular rate and rhythm, S1, S2 normal, no murmur, click, rub or gallop Lungs: diminished breath sounds bibasilar Abdomen: soft, non-tender; bowel sounds normal; no masses,  no organomegaly Extremities: extremities normal, atraumatic, no cyanosis or edema and Homans sign is negative, no sign of DVT Wound: sternum stable  Lab Results: CBC: Recent Labs  09/27/15 0400 09/28/15 0503  WBC 13.9* 13.0*  HGB 8.3* 8.1*  HCT 24.9* 26.0*  PLT 413* 474*   BMET:  Recent Labs  09/27/15 0400 09/28/15 0503  NA 144 144  K 3.3* 4.1  CL 102 102  CO2 31 33*  GLUCOSE 131* 140*  BUN 69* 61*  CREATININE 2.43* 2.29*  CALCIUM 7.7* 7.7*    PT/INR: No results for input(s): LABPROT, INR in the  last 72 hours. Radiology: No results found.   Assessment/Plan: S/P Procedure(s) (LRB): CORONARY ARTERY BYPASS GRAFTING (CABG) TIMES FOUR USING BILATARAL SAPHENOUS VEIN GRAFTS AND LEFT INTERNAL MAMMARY ARTERY (N/A) TRANSESOPHAGEAL ECHOCARDIOGRAM (TEE) (N/A) Mobilize Diuresis Diabetes control Plan for transfer to step-down: see transfer orders To circle, needs increased pt to mobilize     [name] 09/28/2015 7:40 AM

## 2015-09-28 NOTE — Progress Notes (Signed)
Speech Language Pathology Treatment: Dysphagia  Patient Details Name: Monica [name] MRN: [id number] DOB: Dec 07, 1930 Today's Date: 09/28/2015 Time: [id number] SLP Time Calculation (min) (ACUTE ONLY): 11 min  Assessment / Plan / Recommendation Clinical Impression  SLP provided skilled observation during breakfast meal. Intermittent throat clearing noted, which was not related to airway compromise as could be observed during MBS on previous date. However, thin liquid trials resulted in immediate, wet throat clearing that is more concerning for penetration. Recommend to continue with current diet at this time.   HPI HPI: Monica [name] is a 82 y.o. female with PMH of hypertension, hyperlipidemia, diabetes mellitus, asthma, GERD, gout, chronic kidney disease-stage III, peptic ulcer disease, who presents with chest pain.  The patient is s/p CABG x 4 and per nursing has been coughing with intake.  Per nursing the patient was relatively independent prior to admission with not reported swallowing issues.  Current chest xray is showing stable mild-to-moderate congestive heart failure, stable small left pleural effusion and bibasilar lung opacities, left greater than right, slightly      SLP Plan  Continue with current plan of care     Recommendations  Diet recommendations: Dysphagia 1 (puree);Nectar-thick liquid Liquids provided via: Cup Medication Administration: Crushed with puree Supervision: Full supervision/cueing for compensatory strategies Compensations: Minimize environmental distractions;Slow rate;Small sips/bites Postural Changes and/or Swallow Maneuvers: Seated upright 90 degrees             Oral Care Recommendations: Oral care BID Follow up Recommendations: Skilled Nursing facility Plan: Continue with current plan of care     GO               Germain Osgood, M.A. CCC-SLP [phone]  Germain Osgood 09/28/2015, 9:46 AM

## 2015-09-29 LAB — BASIC METABOLIC PANEL
Anion gap: 10 (ref 5–15)
BUN: 62 mg/dL — ABNORMAL HIGH (ref 6–20)
CO2: 31 mmol/L (ref 22–32)
Calcium: 8.1 mg/dL — ABNORMAL LOW (ref 8.9–10.3)
Chloride: 102 mmol/L (ref 101–111)
Creatinine, Ser: 2.02 mg/dL — ABNORMAL HIGH (ref 0.44–1.00)
GFR calc Af Amer: 25 mL/min — ABNORMAL LOW (ref >60)
GFR calc non Af Amer: 22 mL/min — ABNORMAL LOW (ref >60)
Glucose, Bld: 124 mg/dL — ABNORMAL HIGH (ref 65–99)
Potassium: 4 mmol/L (ref 3.5–5.1)
Sodium: 143 mmol/L (ref 135–145)

## 2015-09-29 LAB — GLUCOSE, CAPILLARY
GLUCOSE-CAPILLARY: 106 mg/dL — AB (ref 65–99)
Glucose-Capillary: 258 mg/dL — ABNORMAL HIGH (ref 65–99)
Glucose-Capillary: 179 mg/dL — ABNORMAL HIGH (ref 65–99)
Glucose-Capillary: 161 mg/dL — ABNORMAL HIGH (ref 65–99)

## 2015-09-29 LAB — CBC
HCT: 25.6 % — ABNORMAL LOW (ref 36.0–46.0)
Hemoglobin: 8.4 g/dL — ABNORMAL LOW (ref 12.0–15.0)
MCH: 29.8 pg (ref 26.0–34.0)
MCHC: 32.8 g/dL (ref 30.0–36.0)
MCV: 90.8 fL (ref 78.0–100.0)
Platelets: 489 10*3/uL — ABNORMAL HIGH (ref 150–400)
RBC: 2.82 MIL/uL — ABNORMAL LOW (ref 3.87–5.11)
RDW: 14.3 % (ref 11.5–15.5)
WBC: 13.8 10*3/uL — ABNORMAL HIGH (ref 4.0–10.5)

## 2015-09-29 MED ORDER — INSULIN DETEMIR 100 UNIT/ML ~~LOC~~ SOLN
10.0000 [IU] | Freq: Two times a day (BID) | SUBCUTANEOUS | Status: DC
  Administered 2015-09-29 – 2015-10-03 (×6): 10 [IU] via SUBCUTANEOUS
  Filled 2015-09-29 (×9): qty 0.1

## 2015-09-29 NOTE — Progress Notes (Signed)
Obtain MRI when cleared by surgery. This may be done as an outpatient. Please call the Neurology consultation service if there are further questions.   Kerney Elbe, MD

## 2015-09-29 NOTE — Progress Notes (Addendum)
      [address]       [place] [address]             [phone]        10 Days Post-Op Procedure(s) (LRB): CORONARY ARTERY BYPASS GRAFTING (CABG) TIMES FOUR USING BILATARAL SAPHENOUS VEIN GRAFTS AND LEFT INTERNAL MAMMARY ARTERY (N/A) TRANSESOPHAGEAL ECHOCARDIOGRAM (TEE) (N/A)  Subjective: Patient oriented to place but not time this am. Had bowel movement this am  Objective: Vital signs in last 24 hours: Temp:  [97.7 F (36.5 C)-98.2 F (36.8 C)] 98.2 F (36.8 C) (03/25 0445) Pulse Rate:  [80-90] 82 (03/25 0445) Cardiac Rhythm:  [-] Normal sinus rhythm (03/24 1900) Resp:  [14-18] 18 (03/25 0445) BP: (124-157)/(52-70) 124/66 mmHg (03/25 0445) SpO2:  [90 %-98 %] 97 % (03/25 0445) Weight:  [181 lb 12.8 oz (82.464 kg)] 181 lb 12.8 oz (82.464 kg) (03/25 0445)  Pre op weight 82 kg Current Weight  09/29/15 181 lb 12.8 oz (82.464 kg)       Intake/Output from previous day: 03/24 0701 - 03/25 0700 In: 480 [P.O.:480] Out: 1050 [Urine:1050]   Physical Exam:  Cardiovascular: RRR Pulmonary: Slightly diminished at bases; no rales, wheezes, or rhonchi. Abdomen: Soft, non tender, bowel sounds present. Extremities: Trace bilateral lower extremity edema. Wounds: Clean and dry.  No erythema or signs of infection.  Lab Results: CBC: Recent Labs  09/28/15 0503 09/29/15 0425  WBC 13.0* 13.8*  HGB 8.1* 8.4*  HCT 26.0* 25.6*  PLT 474* 489*   BMET:  Recent Labs  09/28/15 0503 09/29/15 0425  NA 144 143  K 4.1 4.0  CL 102 102  CO2 33* 31  GLUCOSE 140* 124*  BUN 61* 62*  CREATININE 2.29* 2.02*  CALCIUM 7.7* 8.1*    PT/INR:  Lab Results  Component Value Date   INR 1.53* 09/19/2015   INR 1.01 09/17/2015   INR 1.07 09/15/2015   ABG:  INR: Will add last result for INR, ABG once components are confirmed Will add last 4 CBG results once components are confirmed  Assessment/Plan:  1. CV - SR in the 80's. On Lopressor 12.5 mg bid. No ACE as  creatinine elevated 2.  Pulmonary - On room air. Encourage incentive spirometer 3. CKD (stage 3)-Creatinine down to 2.02 4.  Acute blood loss anemia - H and H 8.4 and 25.6 5. Continue PT to help with ambulation 6. GI-last swallow study done 03/22 showed mild-moderate oropharyngeal dysphagia. Recommended dysphagia 1 (Puree) solids;Nectar thick liquid 7. DM-CBGs 225/199/106. On Insulin daily but will change to bid for better glucose control. Pre op HGA1C 8.2. Will need close follow up with medical doctor after discharge 8. Patient with intermittent confusion. Is slowly improving 9. Will need CIR or SNF once ready for discharge  ZIMMERMAN,DONIELLE MPA-C 09/29/2015,8:15 AM   Chart reviewed, patient examined, agree with above.

## 2015-09-29 NOTE — Progress Notes (Signed)
CARDIAC REHAB PHASE I   PRE:  Rate/Rhythm: 84 SR    BP: sitting 140/58    SaO2: 94 RA  MODE:  Ambulation: 150 ft   POST:  Rate/Rhythm: 87 SR with PAC    BP: sitting 156/60     SaO2: 93 RA  Pt getting hair done, agreeable to walking. Needed reminders not to use arms for getting to edge of chair. Fairly steady walking with RW. Became for DOE with distance but did not require rest in 150 ft. VSS. Return to straight back chair to finish her hair. Encouraged her to walk with staff and use IS. Doing well today. [id number]   [place], ACSM 09/29/2015 10:49 AM

## 2015-09-30 LAB — BASIC METABOLIC PANEL
Anion gap: 10 (ref 5–15)
BUN: 59 mg/dL — ABNORMAL HIGH (ref 6–20)
CALCIUM: 8.2 mg/dL — AB (ref 8.9–10.3)
CO2: 32 mmol/L (ref 22–32)
CREATININE: 2 mg/dL — AB (ref 0.44–1.00)
Chloride: 102 mmol/L (ref 101–111)
GFR, EST AFRICAN AMERICAN: 25 mL/min — AB (ref >60)
GFR, EST NON AFRICAN AMERICAN: 22 mL/min — AB (ref >60)
Glucose, Bld: 70 mg/dL (ref 65–99)
Potassium: 4.2 mmol/L (ref 3.5–5.1)
SODIUM: 144 mmol/L (ref 135–145)

## 2015-09-30 LAB — GLUCOSE, CAPILLARY
GLUCOSE-CAPILLARY: 236 mg/dL — AB (ref 65–99)
GLUCOSE-CAPILLARY: 273 mg/dL — AB (ref 65–99)
GLUCOSE-CAPILLARY: 176 mg/dL — AB (ref 65–99)
Glucose-Capillary: 73 mg/dL (ref 65–99)

## 2015-09-30 MED ORDER — AMLODIPINE BESYLATE 10 MG PO TABS
10.0000 mg | ORAL_TABLET | Freq: Every day | ORAL | Status: DC
  Administered 2015-09-30 – 2015-10-03 (×4): 10 mg via ORAL
  Filled 2015-09-30 (×4): qty 1

## 2015-09-30 NOTE — Progress Notes (Addendum)
      [address]       [place] [address]             [phone]        11 Days Post-Op Procedure(s) (LRB): CORONARY ARTERY BYPASS GRAFTING (CABG) TIMES FOUR USING BILATARAL SAPHENOUS VEIN GRAFTS AND LEFT INTERNAL MAMMARY ARTERY (N/A) TRANSESOPHAGEAL ECHOCARDIOGRAM (TEE) (N/A)  Subjective: Patient had shortness of breath last night. She thinks her breathing is better this am.  Objective: Vital signs in last 24 hours: Temp:  [97.7 F (36.5 C)-97.8 F (36.6 C)] 97.7 F (36.5 C) (03/26 0420) Pulse Rate:  [80-90] 88 (03/26 0420) Cardiac Rhythm:  [-] Normal sinus rhythm (03/26 0713) Resp:  [18-19] 18 (03/26 0420) BP: (123-160)/(54-67) 123/54 mmHg (03/26 0420) SpO2:  [91 %-100 %] 100 % (03/26 0420) Weight:  [183 lb 1.6 oz (83.054 kg)] 183 lb 1.6 oz (83.054 kg) (03/26 0255)  Pre op weight 82 kg Current Weight  09/30/15 183 lb 1.6 oz (83.054 kg)       Intake/Output from previous day: 03/25 0701 - 03/26 0700 In: 240 [P.O.:240] Out: 651 [Urine:650; Stool:1]   Physical Exam:  Cardiovascular: RRR Pulmonary: Slightly diminished at bases; no rales, wheezes, or rhonchi. Abdomen: Soft, non tender, bowel sounds present. Extremities: Trace bilateral lower extremity edema. Wounds: Clean and dry.  No erythema or signs of infection.  Lab Results: CBC:  Recent Labs  09/28/15 0503 09/29/15 0425  WBC 13.0* 13.8*  HGB 8.1* 8.4*  HCT 26.0* 25.6*  PLT 474* 489*   BMET:   Recent Labs  09/29/15 0425 09/30/15 0445  NA 143 144  K 4.0 4.2  CL 102 102  CO2 31 32  GLUCOSE 124* 70  BUN 62* 59*  CREATININE 2.02* 2.00*  CALCIUM 8.1* 8.2*    PT/INR:  Lab Results  Component Value Date   INR 1.53* 09/19/2015   INR 1.01 09/17/2015   INR 1.07 09/15/2015   ABG:  INR: Will add last result for INR, ABG once components are confirmed Will add last 4 CBG results once components are confirmed  Assessment/Plan:  1. CV - SR in the 80's. On Lopressor 12.5  mg bid. No ACE as creatinine elevated. Will restart Norvasc for better BP control. 2.  Pulmonary - On room air. Encourage incentive spirometer 3. CKD (stage 3)-Creatinine down to 2 (baseline around 1.7) 4. Volume Overload-on Lasix 40 mg IV 5.  Acute blood loss anemia - H and H 8.4 and 25.6 6. Continue PT to help with ambulation 7. GI-last swallow study done 03/22 showed mild-moderate oropharyngeal dysphagia. Recommended dysphagia 1 (Puree) solids;Nectar thick liquid 8. DM-CBGs 179/161/73. On Insulin bid. Pre op HGA1C 8.2. Will need close follow up with medical doctor after discharge 9. Patient with intermittent confusion. Is slowly improving 10. Will need CIR or SNF once ready for discharge  ZIMMERMAN,DONIELLE MPA-C 09/30/2015,8:17 AM    Chart reviewed, patient examined, agree with above. Stable slow progress. Renal function continues to improve.

## 2015-10-01 ENCOUNTER — Inpatient Hospital Stay (HOSPITAL_COMMUNITY): Payer: Medicare HMO

## 2015-10-01 LAB — GLUCOSE, CAPILLARY
GLUCOSE-CAPILLARY: 150 mg/dL — AB (ref 65–99)
GLUCOSE-CAPILLARY: 302 mg/dL — AB (ref 65–99)
GLUCOSE-CAPILLARY: 154 mg/dL — AB (ref 65–99)
Glucose-Capillary: 325 mg/dL — ABNORMAL HIGH (ref 65–99)
Glucose-Capillary: 58 mg/dL — ABNORMAL LOW (ref 65–99)
Glucose-Capillary: 75 mg/dL (ref 65–99)
Glucose-Capillary: 242 mg/dL — ABNORMAL HIGH (ref 65–99)

## 2015-10-01 MED ORDER — METOPROLOL TARTRATE 25 MG PO TABS
25.0000 mg | ORAL_TABLET | Freq: Two times a day (BID) | ORAL | Status: DC
  Administered 2015-10-01 – 2015-10-03 (×5): 25 mg via ORAL
  Filled 2015-10-01 (×5): qty 1

## 2015-10-01 MED ORDER — FUROSEMIDE 10 MG/ML IJ SOLN
40.0000 mg | Freq: Once | INTRAMUSCULAR | Status: AC
  Administered 2015-10-01: 40 mg via INTRAVENOUS
  Filled 2015-10-01: qty 4

## 2015-10-01 MED ORDER — GLUCERNA SHAKE PO LIQD
237.0000 mL | Freq: Two times a day (BID) | ORAL | Status: DC
  Administered 2015-10-01 – 2015-10-02 (×3): 237 mL via ORAL

## 2015-10-01 MED ORDER — INSULIN ASPART 100 UNIT/ML ~~LOC~~ SOLN
5.0000 [IU] | Freq: Three times a day (TID) | SUBCUTANEOUS | Status: DC
  Administered 2015-10-02 – 2015-10-03 (×4): 5 [IU] via SUBCUTANEOUS

## 2015-10-01 NOTE — Progress Notes (Signed)
CARDIAC REHAB PHASE I   PRE:  Rate/Rhythm: 84 SR  BP:  Supine:   Sitting: 125/59  Standing:    SaO2: 100% 2.5L  MODE:  Ambulation: 150 ft   POST:  Rate/Rhythm: 92 SR  BP:  Supine:   Sitting: 145/47  Standing:    SaO2: 95% 2L 1325-1352 Pt willing to walk again. Walked 150 ft on 2L with gait belt use, rollator and asst x 2. Had to sit once to rest. Encouraged pt not to use arms as she had tendency to use arms too much. Encouraged her to stay close to walker during walk.    Graylon Good, RN BSN  10/01/2015 1:48 PM

## 2015-10-01 NOTE — Progress Notes (Signed)
CRITICAL VALUE ALERT  Critical value received:  cbg 58; grape juice given per protocol. Rechecked, it's now 71.  Date of notification:  10/01/15  Time of notification:  06:25  Critical value read back:  Nurse who received alert:  Betzayda Braxton i.  MD notified (1st page):    Time of first page:    MD notified (2nd page):  Time of second page:  Responding MD:    Time MD responded:

## 2015-10-01 NOTE — Care Management Important Message (Signed)
Important Message  Patient Details  Name: Monica [name] MRN: [id number] Date of Birth: July 09, 1930   Medicare Important Message Given:  Yes    Loann Quill 10/01/2015, 12:44 PM

## 2015-10-01 NOTE — Progress Notes (Signed)
Physical Therapy Treatment Patient Details Name: Monica [name] MRN: [id number] DOB: 1930/08/24 Today's Date: 10/01/2015    History of Present Illness Monica [name] is a 82 y.o. female with PMH of hypertension, hyperlipidemia, diabetes mellitus, asthma, GERD, gout, chronic kidney disease-stage III, peptic ulcer disease, who presents with chest pain. For cardiac cath 09/17/2015; underwent CABG x 4 on 09/19/15    PT Comments    Patient continues to be confused. Despite max multimodal cues, pt continues to use UEs for all transfers and mobility. Improved ambulation distance with longer seated rest breaks. Requires less assist to stand today. Continues to be appropriate for ST SNF. Will follow acutely.   Follow Up Recommendations  SNF     Equipment Recommendations  None recommended by PT    Recommendations for Other Services       Precautions / Restrictions Precautions Precautions: Fall;Sternal Restrictions Weight Bearing Restrictions: Yes Other Position/Activity Restrictions: sternal precautions    Mobility  Bed Mobility Overal bed mobility: Needs Assistance Bed Mobility: Supine to Sit     Supine to sit: Mod assist;HOB elevated     General bed mobility comments: Assist to elevate trunk and scoot bottom to EOB. Despite cues not to use UEs for transfer, pt using them.   Transfers Overall transfer level: Needs assistance Equipment used: Rolling walker (2 wheeled) Transfers: Sit to/from Stand Sit to Stand: Min assist         General transfer comment: Min A to boost from EOB with cues for hand placement/technique. Encouraged to manually hold heart pillow but despite max manual cues, pt continues to use UEs to stand. Stood from chair x3.  Ambulation/Gait Ambulation/Gait assistance: Min guard Ambulation Distance (Feet): 100 Feet (+ 50' + 70' + 40') Assistive device: Rolling walker (2 wheeled) Gait Pattern/deviations: Step-through pattern;Decreased stride length Gait velocity:  decreased   General Gait Details: Slow, unsteady gait with 3/4 DOE. 3 seated rest breaks. Continues to use UEs for all mobility despite cues.   Stairs            Wheelchair Mobility    Modified Rankin (Stroke Patients Only)       Balance Overall balance assessment: Needs assistance Sitting-balance support: Feet supported;No upper extremity supported Sitting balance-Leahy Scale: Fair     Standing balance support: During functional activity;Bilateral upper extremity supported Standing balance-Leahy Scale: Poor                      Cognition Arousal/Alertness: Awake/alert Behavior During Therapy: WFL for tasks assessed/performed Overall Cognitive Status: Impaired/Different from baseline Area of Impairment: Orientation;Memory Orientation Level: Disoriented to;Place;Time   Memory: Decreased short-term memory Following Commands: Follows one step commands with increased time     Problem Solving: Slow processing;Decreased initiation;Requires verbal cues General Comments: Continues to be confused. Despite multimodal max cues to follow sternal precautions, pt not able to comprehend and carry out.    Exercises      General Comments General comments (skin integrity, edema, etc.): Son present during session.      Pertinent Vitals/Pain Pain Assessment: Faces Faces Pain Scale: Hurts little more Pain Location: sternum Pain Descriptors / Indicators: Grimacing;Guarding Pain Intervention(s): Repositioned;Monitored during session    Home Living                      Prior Function            PT Goals (current goals can now be found in the care plan section) Progress towards PT  goals: Progressing toward goals    Frequency  Min 3X/week    PT Plan Current plan remains appropriate    Co-evaluation             End of Session Equipment Utilized During Treatment: Gait belt Activity Tolerance: Patient tolerated treatment well Patient left: in  chair;with call bell/phone within reach;with family/visitor present     Time: 1037-1101 PT Time Calculation (min) (ACUTE ONLY): 24 min  Charges:  $Gait Training: 8-22 mins $Therapeutic Activity: 8-22 mins                    G Codes:      Marcellous Snarski A Evolet Salminen 10/01/2015, 12:21 PM Wray Kearns, [place], DPT [phone]

## 2015-10-01 NOTE — Progress Notes (Addendum)
      [address]       [place] [address]             [phone]      12 Days Post-Op Procedure(s) (LRB): CORONARY ARTERY BYPASS GRAFTING (CABG) TIMES FOUR USING BILATARAL SAPHENOUS VEIN GRAFTS AND LEFT INTERNAL MAMMARY ARTERY (N/A) TRANSESOPHAGEAL ECHOCARDIOGRAM (TEE) (N/A)   Subjective:  Monica [name] has no new complaints.    Objective: Vital signs in last 24 hours: Temp:  [97.6 F (36.4 C)-98.6 F (37 C)] 97.6 F (36.4 C) (03/27 0522) Pulse Rate:  [83-88] 83 (03/27 0522) Cardiac Rhythm:  [-] Normal sinus rhythm (03/26 2013) Resp:  [17-18] 18 (03/27 0522) BP: (140-146)/(55-56) 140/55 mmHg (03/27 0522) SpO2:  [100 %] 100 % (03/27 0522) Weight:  [182 lb 6.4 oz (82.736 kg)] 182 lb 6.4 oz (82.736 kg) (03/27 0546)  Intake/Output from previous day: 03/26 0701 - 03/27 0700 In: 360 [P.O.:360] Out: 850 [Urine:850]  General appearance: alert, cooperative and no distress Heart: regular rate and rhythm Lungs: clear to auscultation bilaterally Abdomen: soft, non-tender; bowel sounds normal; no masses,  no organomegaly Extremities: edema trace Wound: clean and dry  Lab Results:  Recent Labs  09/29/15 0425  WBC 13.8*  HGB 8.4*  HCT 25.6*  PLT 489*   BMET:  Recent Labs  09/29/15 0425 09/30/15 0445  NA 143 144  K 4.0 4.2  CL 102 102  CO2 31 32  GLUCOSE 124* 70  BUN 62* 59*  CREATININE 2.02* 2.00*  CALCIUM 8.1* 8.2*    PT/INR: No results for input(s): LABPROT, INR in the last 72 hours. ABG    Component Value Date/Time   PHART 7.273* 09/21/2015 1628   HCO3 20.4 09/21/2015 1628   TCO2 22 09/21/2015 1628   ACIDBASEDEF 6.0* 09/21/2015 1628   O2SAT 84.0 09/21/2015 1628   CBG (last 3)   Recent Labs  09/30/15 2144 10/01/15 0624 10/01/15 0650  GLUCAP 176* 58* 75    Assessment/Plan: S/P Procedure(s) (LRB): CORONARY ARTERY BYPASS GRAFTING (CABG) TIMES FOUR USING BILATARAL SAPHENOUS VEIN GRAFTS AND LEFT INTERNAL MAMMARY ARTERY  (N/A) TRANSESOPHAGEAL ECHOCARDIOGRAM (TEE) (N/A)  1. CV- NSR in the 80s, BP remains elevated- will continue Norvasc, increase Lopressor to 25 mg BID 2. Pulm- wean oxygen as tolerated, continue IS 3. Renal- no BMET today, will order for AM, remains hypervolemic continue Lasix 4. Anemia- Expected post operative blood loss- Hgb at 8.4 yesterday, will repeat in AM 5. :DM- Preop A1c is 8.2, sugars erratic ranging from 58-350 hypglycemic this AM may need to adjust insulin 6. Deconditioning- needs SNF vs CIR, states hasn't seen social work yet 7. Dispo- patient stable, adjust insulin regimen, repeat BMET, CBC, continue diuresis   LOS: 12 days    BARRETT, Monica 10/01/2015  Renal function now cr 2.00 Continue lasix , has been on preop I have seen and examined Monica [name] and agree with the above assessment  and plan.  [name] MD [facility] [phone] Office [phone] 10/01/2015 6:41 PM

## 2015-10-01 NOTE — Progress Notes (Signed)
Inpatient Diabetes Program Recommendations  AACE/ADA: New Consensus Statement on Inpatient Glycemic Control (2015)  Target Ranges:  Prepandial:   less than 140 mg/dL      Peak postprandial:   less than 180 mg/dL (1-2 hours)      Critically ill patients:  140 - 180 mg/dL  Results for ANDREA, DUCK (MRN [id number]) as of 10/01/2015 09:02  Ref. Range 09/30/2015 06:11 09/30/2015 11:14 09/30/2015 13:06 09/30/2015 16:18 09/30/2015 21:44 10/01/2015 06:24 10/01/2015 06:50  Glucose-Capillary Latest Ref Range: 65-99 mg/dL 73 236 (H) 325 (H) 273 (H) 176 (H) 58 (L) 75   Review of Glycemic Control  Current orders for Inpatient glycemic control: Lantus 10 units BID, Novolog 0-24 units ACHS, Novolog 5 units TID with meals for meal coverage  Inpatient Diabetes Program Recommendations: Insulin - Basal: Fasting glucose 58 mg/dl ths morning. Please consider leaving morning dose of Lantus at 10 units QAM and decrease bedtime dose of Lantus to 5 units QHS. Insulin - Meal Coverage: Noted meal coverage ordered this morning.  Thanks, Barnie Alderman, RN, MSN, CDE Diabetes Coordinator Inpatient Diabetes Program [phone] (Team Pager from [place] to [place]) [phone] ([place] office) [phone] [facility] office) [phone] [facility] office)

## 2015-10-01 NOTE — Progress Notes (Signed)
Speech Language Pathology Dysphagia Treatment Patient Details Name: Mindy Braud MRN: [id number] DOB: 1930-08-22 Today's Date: 10/01/2015 Time: [id number] SLP Time Calculation (min) (ACUTE ONLY): 9 min  Assessment / Plan / Recommendation Clinical Impression    SLP noted throat clearing upon arrival which was present during prior to po's (throat clearing during MBS without airway compromise). MBS completed 3/22 with no penetration/aspiration events observed during throat clears. No other overt s/s of penetration/aspiration observed throughout session. Pt appeared to tolerate thin liquids and regular solids well. Pt and son educated re: diet recommendation of regular textures, thin liquids (straws allowed) and meds whole in puree. SLP will f/u to determine diet tolerance.    Diet Recommendation    Regular textures, thin liquids (straws allowed), meds whole in puree   SLP Plan Continue with current plan of care      Swallowing Goals     General Behavior/Cognition: Alert;Cooperative;Pleasant mood;Requires cueing Patient Positioning: Upright in chair/Tumbleform Oral care provided: N/A HPI: Alexcia Auth is a 82 y.o. female with PMH of hypertension, hyperlipidemia, diabetes mellitus, asthma, GERD, gout, chronic kidney disease-stage III, peptic ulcer disease, who presents with chest pain.  The patient is s/p CABG x 4 and per nursing has been coughing with intake.  Per nursing the patient was relatively independent prior to admission with not reported swallowing issues.  Current chest xray is showing stable mild-to-moderate congestive heart failure, stable small left pleural effusion and bibasilar lung opacities, left greater than right, slightly  Oral Cavity - Oral Hygiene     Dysphagia Treatment Family/Caregiver Educated: son Treatment Methods: Skilled observation;Upgraded PO texture trial;Compensation strategy training;Patient/caregiver education Patient observed directly with PO's: Yes Type of  PO's observed: Regular;Thin liquids Feeding: Able to feed self;Needs set up Liquids provided via: Cup;Straw Oral Phase Signs & Symptoms:  (none) Pharyngeal Phase Signs & Symptoms:  (baseline throat clear) Type of cueing: Verbal;Visual Amount of cueing: Minimal   Titus Mould, Student-SLP        Lauren Boyette 10/01/2015, 12:01 PM

## 2015-10-01 NOTE — Progress Notes (Signed)
[facility] has [facility] Staggers for pt to come when medically stable- CSW updated physician who does not anticipate DC today.  [employer] Josem Kaufmann should be good for 48 hours- will need to know if pt will not DC within that time.  Pt dtr called CSW for update- she continues to be unsure if MD wants to sent pt home or to SNF but is agreeable to [facility] if MD recommends SNF  CSW will continue to follow  Domenica Reamer, [place] Social Worker [phone]

## 2015-10-01 NOTE — Progress Notes (Signed)
Utilization review completed.  

## 2015-10-02 LAB — BASIC METABOLIC PANEL
ANION GAP: 8 (ref 5–15)
BUN: 60 mg/dL — AB (ref 4–21)
BUN: 60 mg/dL — AB (ref 6–20)
CALCIUM: 8.5 mg/dL — AB (ref 8.9–10.3)
CO2: 32 mmol/L (ref 22–32)
Chloride: 100 mmol/L — ABNORMAL LOW (ref 101–111)
Creatinine, Ser: 2.04 mg/dL — ABNORMAL HIGH (ref 0.44–1.00)
Creatinine: 2 mg/dL — AB (ref 0.5–1.1)
GFR calc Af Amer: 25 mL/min — ABNORMAL LOW (ref >60)
GFR, EST NON AFRICAN AMERICAN: 21 mL/min — AB (ref >60)
GLUCOSE: 121 mg/dL — AB (ref 65–99)
Glucose: 121 mg/dL
Potassium: 5 mmol/L (ref 3.5–5.1)
SODIUM: 140 mmol/L (ref 137–147)
Sodium: 140 mmol/L (ref 135–145)

## 2015-10-02 LAB — GLUCOSE, CAPILLARY
GLUCOSE-CAPILLARY: 138 mg/dL — AB (ref 65–99)
Glucose-Capillary: 298 mg/dL — ABNORMAL HIGH (ref 65–99)
Glucose-Capillary: 253 mg/dL — ABNORMAL HIGH (ref 65–99)
Glucose-Capillary: 155 mg/dL — ABNORMAL HIGH (ref 65–99)

## 2015-10-02 LAB — CBC
HCT: 27.1 % — ABNORMAL LOW (ref 36.0–46.0)
Hemoglobin: 8.2 g/dL — ABNORMAL LOW (ref 12.0–15.0)
MCH: 28.1 pg (ref 26.0–34.0)
MCHC: 30.3 g/dL (ref 30.0–36.0)
MCV: 92.8 fL (ref 78.0–100.0)
Platelets: 537 10*3/uL — ABNORMAL HIGH (ref 150–400)
RBC: 2.92 MIL/uL — ABNORMAL LOW (ref 3.87–5.11)
RDW: 14.5 % (ref 11.5–15.5)
WBC: 13.8 10*3/uL — ABNORMAL HIGH (ref 4.0–10.5)

## 2015-10-02 LAB — CBC AND DIFFERENTIAL: WBC: 13.8 10^3/mL

## 2015-10-02 NOTE — Discharge Summary (Signed)
Physician Discharge Summary  Patient ID: Monica [name] MRN: [id number] DOB/AGE: 80/11/1930 80 y.o.  Admit date: 09/14/2015 Discharge date: 10/03/2015  Admission Diagnoses:  Patient Active Problem List   Diagnosis Date Noted  . Altered mental status   . Unstable angina ([place]) 09/17/2015  . Abnormal finding on EKG - dynamic changes 09/17/2015  . Pain in the chest   . Essential hypertension   . GERD (gastroesophageal reflux disease) 09/15/2015  . Gout 09/15/2015  . Chest pain 09/15/2015  . Accelerated hypertension   . Hyperlipidemia   . Diabetes mellitus with renal complications ([place])   . Asthma   . CKD (chronic kidney disease), stage III   . Metatarsalgia of both feet 07/25/2014  . Equinus deformity of foot, acquired 07/25/2014  . Onychomycosis 07/25/2014  . Pain in lower limb 07/25/2014   Discharge Diagnoses:   Patient Active Problem List   Diagnosis Date Noted  . Altered mental status   . Unstable angina ([place]) 09/17/2015  . Abnormal finding on EKG - dynamic changes 09/17/2015  . Pain in the chest   . Essential hypertension   . GERD (gastroesophageal reflux disease) 09/15/2015  . Gout 09/15/2015  . Chest pain 09/15/2015  . Accelerated hypertension   . Hyperlipidemia   . Diabetes mellitus with renal complications ([place])   . Asthma   . CKD (chronic kidney disease), stage III   . Metatarsalgia of both feet 07/25/2014  . Equinus deformity of foot, acquired 07/25/2014  . Onychomycosis 07/25/2014  . Pain in lower limb 07/25/2014   Discharged Condition: good  History of Present Illness:  Monica [name] is an 82 yo African American Female with past medical history of hypertension, hyperlipidemia, diabetes mellitus, asthma, GERD, gout, chronic kidney disease-stage III, and peptic ulcer disease.  She presented with complaint of chest pain.  She states she has been experiencing chest pain for about 2 months, occurring 2-3 times per week.  Each episode last about 20-30 min in duration  and would resolve on its own.  However on 09/15/2015 the patient developed an episode around 6 pm in the evening.  It was located sub-sternally and she described it as being severe and sharp but did not radiate any where.  It was not associated with nausea, diarrhea, or diaphoresis.  She presented to the ED where work up revealed negative Troponin levels and non specific EKG changes.  She was admitted for observation.  Hospital Course:   During admission she underwent cardiac catheterization on 09/17/2015.  This revealed severe 3 vessel CAD.  It was felt coronary bypass grafting would be indicated and TCTS consult was obtained.  She was evaluated by Dr. Servando Snare who was in agreement the patient would benefit from Coronary bypass procedure.  The risks and benefits of the procedure were explained to the patient and she was agreeable to proceed.  She was taken to the operating room on 09/19/2015.  She underwent CABG x 4 utilizing LIMA to LAD, SVG to Ramus Intermediate, SVG to OM, and SVG to distal RCA.  She also underwent endoscopic saphenous vein harvest from the left and right leg.  She tolerated the procedure without difficulty and was taken to the SICU in stable condition.  During her stay in the SICU the patient was extubated on POD #1.  She was weaned off Dopamine and Nitroglycerin drips as tolerated.  She was transfused packed red cells for expected post operative blood loss anemia.  Her chest tubes and arterial lines were removed without difficulty.  She coughed when attempting to eat.  There was concern for possible increased risk of aspiration and Speech and swallow evaluation was obtained and showed swallowing dysfunction.  This was monitored throughout her stay and her diet was advanced per recommendations.  However, she initially was kept NPO and required placement of a panda feeding tube.  She developed some acute mental status changes.  Ultimately modified barium swallow was completed and it was  recommended the patient be placed on a modified diet.  Workup for stroke was completed and an acute stroke could not be ruled out, however it was felt her changes were likely due to metabolic encephalopathy.  The patient continued to make progress.  Her mental status changes improved.  Her swallowing dysfunction improved.  She was maintaining NSR and felt medically stable for transfer to the stepdown unit on POD #9.  The patient has continued to make progress.  Her dysphagia continues to improve and she is on a carb modified diet.  Her confusion and mental status changes have also resolved.  She continues to maintain NSR and her pacing wires were removed without difficulty.  She has a history of Chronic Kidney Disease.  She has been followed closely by Nephrology throughout her hospital stay.  Her creatinine is currently at 2.04 with a baseline level of 1.7. Please monitor as needed.  She has been evaluated by Physical Therapy who recommended placement at a SNF.  The patient is agreeable to this and arrangements have been made.  She is ambulating with assistance.  She is being weaned off oxygen as tolerated.  She is medically stable for discharge home today.          Consults: nephrology and neurology  Significant Diagnostic Studies: angiography:    Prox RCA to Mid RCA lesion, 60% stenosed.  Dist RCA lesion, 75% stenosed.  Ost Cx lesion, 99% stenosed.  Prox LAD to Mid LAD lesion, 90% stenosed.  Ost 2nd Diag lesion, 80% stenosed.  Treatments: surgery:   Coronary artery bypass grafting x4, urgent coronary artery bypass grafting x4 with the left internal mammary to the left anterior descending coronary artery, reverse saphenous vein graft to the intermediate coronary artery, reverse saphenous vein graft to the obtuse marginal coronary artery, reverse saphenous vein graft to the distal right coronary artery with left greater saphenous thigh and calf endo-vein harvesting, and right thigh greater  saphenous endo-vein harvesting.  Disposition: SNF      Discharge Instructions    Amb Referral to Cardiac Rehabilitation    Complete by:  As directed   Diagnosis:  CABG Comment - to [facility]           Discharge medications:  The patient has been discharged on:   1.Beta Blocker:  Yes [ x  ]                              No   [   ]                              If No, reason:  2.Ace Inhibitor/ARB: Yes [   ]                                     No  [  x  ]  If No, reason: CKD Stg III creatinine >2.0  3.Statin:   Yes [  x ]                  No  [   ]                  If No, reason:  4.Ecasa:  Yes  [ x  ]                  No   [   ]                  If No, reason:       Medication List    TAKE these medications        acetaminophen 325 MG tablet  Commonly known as:  TYLENOL  Take 2 tablets (650 mg total) by mouth every 6 (six) hours as needed for mild pain.     albuterol 108 (90 Base) MCG/ACT inhaler  Commonly known as:  PROVENTIL HFA;VENTOLIN HFA  Inhale 2 puffs into the lungs every 6 (six) hours as needed for wheezing or shortness of breath.     amLODipine 10 MG tablet  Commonly known as:  NORVASC  Take 10 mg by mouth daily.     aspirin 81 MG EC tablet  Take 1 tablet (81 mg total) by mouth daily.     atorvastatin 40 MG tablet  Commonly known as:  LIPITOR  Take 1 tablet (40 mg total) by mouth daily at 6 PM.     CALCIUM + D PO  Take 1 tablet by mouth daily.     calcium carbonate 500 MG chewable tablet  Commonly known as:  TUMS - dosed in mg elemental calcium  Chew 1 tablet by mouth daily as needed for indigestion or heartburn.     diphenhydrAMINE 25 MG tablet  Commonly known as:  BENADRYL  Take 25 mg by mouth as needed for itching or allergies.     furosemide 20 MG tablet  Commonly known as:  LASIX  Take 20 mg by mouth every other day.     insulin glargine 100 UNIT/ML injection  Commonly known as:  LANTUS   Inject 20 Units into the skin 2 (two) times daily.     insulin lispro 100 UNIT/ML cartridge  Commonly known as:  HUMALOG  Inject 5-10 Units into the skin 3 (three) times daily between meals as needed (Per sliding scale).     IRON CR PO  Take 65 mg by mouth daily.     levalbuterol 0.63 MG/3ML nebulizer solution  Commonly known as:  XOPENEX  Take 3 mLs (0.63 mg total) by nebulization every 6 (six) hours as needed for wheezing or shortness of breath.     meclizine 12.5 MG tablet  Commonly known as:  ANTIVERT  Take 12.5 mg by mouth 3 (three) times daily as needed for dizziness.     metoprolol tartrate 25 MG tablet  Commonly known as:  LOPRESSOR  Take 1 tablet (25 mg total) by mouth 2 (two) times daily.     traMADol 50 MG tablet  Commonly known as:  ULTRAM  Take 1 tablet (50 mg total) by mouth every 12 (twelve) hours as needed for moderate pain.     VITAMIN D-3 PO  Take 1 capsule by mouth daily.       Follow-up Information    Follow up with [facility] .   Specialty:  Skilled  Nursing Facility   Contact information:   [address] [place] [phone]      Follow up with [name], MD On 11/08/2015.   Specialty:  Cardiothoracic Surgery   Why:  Appointment is at 1:30   Contact information:   [place] [address] [phone]       Follow up with [facility].   Contact information:   [place]       Follow up with Richardson Dopp, PA-C On 10/19/2015.   Specialties:  Physician Assistant, Radiology, Interventional Cardiology   Why:  Appointment is at 9:10   Contact information:   [address] [address] [phone]       Follow up with Nephrologist.   Why:  Please set up follow up with your kidney doctor in [place]       Signed: [place], [place] 10/03/2015, 2:16 PM

## 2015-10-02 NOTE — Progress Notes (Signed)
EPW's pulled per order and unit protocol. Pt tolerated very well.  All tips intact, sites painted, nothing remarkable.  Pt understands bedrest with frequent VS for one hour.  CCMD notified, will monitor closely.

## 2015-10-02 NOTE — Progress Notes (Signed)
Pt assisted to ambulate in hall approx 200 ft using RW.  Several standing rest breaks.  Needed some verbal encouragement and coaching regarding posture and sternal precautions.  To recliner after walk with family present.  Will con't plan of care.

## 2015-10-02 NOTE — Progress Notes (Signed)
Inpatient Diabetes Program Recommendations  AACE/ADA: New Consensus Statement on Inpatient Glycemic Control (2015)  Target Ranges:  Prepandial:   less than 140 mg/dL      Peak postprandial:   less than 180 mg/dL (1-2 hours)      Critically ill patients:  140 - 180 mg/dL  Results for ALMEE, LOCKLIN (MRN [id number]) as of 10/02/2015 09:49  Ref. Range 10/01/2015 06:24 10/01/2015 06:50 10/01/2015 11:16 10/01/2015 16:24 10/01/2015 20:27 10/01/2015 22:39 10/02/2015 05:48  Glucose-Capillary Latest Ref Range: 65-99 mg/dL 58 (L) 75 150 (H) 302 (H) 242 (H) 154 (H) 138 (H)   Review of Glycemic Control  Current orders for Inpatient glycemic control: Lantus 10 units BID, Novolog 0-24 units ACHS, Novolog 5 units TID with meals for meal coverage   Inpatient Diabetes Program Recommendations: Insulin - Basal: In reviewing the chart, noted that patient did not receive any basal insulin at all yesterday. As a result glucose went up to 302 mg/dl yesterday afternoon. Please consider changing Levemir to 10 units QAM and 5 units QHS.  Insulin - Meal Coverage: If patient is eating at least 50% of meals, please consider ordering Novolog 3 units TID with meals for meal coverage.  Thanks, Barnie Alderman, RN, MSN, CDE Diabetes Coordinator Inpatient Diabetes Program [phone] (Team Pager from [place] to [place]) [phone] ([place] office) [phone] [facility] Main office) [phone] [facility] office)

## 2015-10-02 NOTE — Progress Notes (Signed)
Physical Therapy Treatment Patient Details Name: Monica [name] MRN: [id number] DOB: 25-Jul-1930 Today's Date: 10/02/2015    History of Present Illness Monica [name] is a 82 y.o. female with PMH of hypertension, hyperlipidemia, diabetes mellitus, asthma, GERD, gout, chronic kidney disease-stage III, peptic ulcer disease, who presents with chest pain. For cardiac cath 09/17/2015; underwent CABG x 4 on 09/19/15    PT Comments    Pt performed increased gait distance SPO2 on 3L 98%, HR 81 bpm.  Pt required increased time and seated rest period x1.  Max cues to maintain sternal precautions.    Follow Up Recommendations  SNF     Equipment Recommendations  None recommended by PT    Recommendations for Other Services       Precautions / Restrictions Precautions Precautions: Fall;Sternal Precaution Comments: mild unsteadiness with ambualtion, require re-education on sternal precautions.      Mobility  Bed Mobility Overal bed mobility:  (Pt received in recliner chair.  )                Transfers Overall transfer level: Needs assistance Equipment used: 4-wheeled walker Transfers: Sit to/from Stand Sit to Stand: Min assist         General transfer comment: Pt performed transfer from low seated recliner and rollator seat.  Pt required rocking momentum to improve transfer.  Min assist to boost into standing, remains to require max cues to hold heart pillow and avoid using UEs.  Pt has difficulty maintaining sternal precautions.    Ambulation/Gait Ambulation/Gait assistance: Min guard Ambulation Distance (Feet): 140 Feet (+60 ft) Assistive device: 4-wheeled walker Gait Pattern/deviations: Step-through pattern;Decreased stride length;Trunk flexed Gait velocity: decreased   General Gait Details: Slow, unsteady gait with 3/4 DOE. 1 seated rest breaks. Continues to use UEs intermittent and requires cues to correct.  Cues for forward flexion to improve forward gaze and postural awareness.      Stairs            Wheelchair Mobility    Modified Rankin (Stroke Patients Only)       Balance Overall balance assessment: Needs assistance   Sitting balance-Leahy Scale: Fair       Standing balance-Leahy Scale: Poor                      Cognition Arousal/Alertness: Awake/alert Behavior During Therapy: WFL for tasks assessed/performed Overall Cognitive Status: Within Functional Limits for tasks assessed                      Exercises Other Exercises Other Exercises: 1x10 reps: B scapular retraction, B shoulder elevation, B shoulder circles backward and neck flexion/extension.  1x10 reps seated LAQs (x3sec hold in ext), pillow squeeze and marching.      General Comments        Pertinent Vitals/Pain Pain Assessment: Faces Pain Score: 4  Pain Location: sternum Pain Descriptors / Indicators: Sore Pain Intervention(s): Monitored during session;Repositioned    Home Living                      Prior Function            PT Goals (current goals can now be found in the care plan section) Acute Rehab PT Goals Patient Stated Goal: None stated Potential to Achieve Goals: Good Progress towards PT goals: Progressing toward goals    Frequency  Min 3X/week    PT Plan Current plan remains appropriate  Co-evaluation             End of Session Equipment Utilized During Treatment: Gait belt Activity Tolerance: Patient tolerated treatment well Patient left: in chair;with call bell/phone within reach;with family/visitor present     Time: [id number] PT Time Calculation (min) (ACUTE ONLY): 31 min  Charges:  $Gait Training: 8-22 mins $Therapeutic Exercise: 8-22 mins                    G Codes:      Monica [name] 10-03-15, 4:35 PM  Monica [name], PTA pager [phone]

## 2015-10-02 NOTE — Progress Notes (Signed)
Ed completed with pt and son. Son voiced understanding. Worked with pt on understanding sternal precautions however she continued to need verbal cues to remember after discussing. Pt interested in CRPII and will send referral to [facility] for "down the road".  [id number] Yves Dill CES, ACSM 10:35 AM 10/02/2015

## 2015-10-02 NOTE — Progress Notes (Addendum)
      [address]       [place] [address]             [phone]      13 Days Post-Op Procedure(s) (LRB): CORONARY ARTERY BYPASS GRAFTING (CABG) TIMES FOUR USING BILATARAL SAPHENOUS VEIN GRAFTS AND LEFT INTERNAL MAMMARY ARTERY (N/A) TRANSESOPHAGEAL ECHOCARDIOGRAM (TEE) (N/A)   Subjective:  Monica [name] is feeling better this morning.  She had some shortness of breath yesterday afternoon which improved with breathing treatment.  Objective: Vital signs in last 24 hours: Temp:  [97.5 F (36.4 C)-98.4 F (36.9 C)] 97.5 F (36.4 C) (03/28 0547) Pulse Rate:  [81-86] 86 (03/27 2239) Cardiac Rhythm:  [-] Normal sinus rhythm (03/27 1900) Resp:  [18] 18 (03/28 0547) BP: (118-149)/(46-61) 149/61 mmHg (03/28 0547) SpO2:  [96 %-100 %] 100 % (03/28 0547) Weight:  [187 lb 2.7 oz (84.9 kg)] 187 lb 2.7 oz (84.9 kg) (03/28 0555)  Intake/Output from previous day: 03/27 0701 - 03/28 0700 In: 120 [P.O.:120] Out: 800 [Urine:800]  General appearance: alert, cooperative and no distress Heart: regular rate and rhythm Lungs: clear to auscultation bilaterally Abdomen: soft, non-tender; bowel sounds normal; no masses,  no organomegaly Extremities: edema trace Wound: clean and dry  Lab Results:  Recent Labs  10/02/15 0430  WBC 13.8*  HGB 8.2*  HCT 27.1*  PLT 537*   BMET:  Recent Labs  09/30/15 0445 10/02/15 0430  NA 144 140  K 4.2 5.0  CL 102 100*  CO2 32 32  GLUCOSE 70 121*  BUN 59* 60*  CREATININE 2.00* 2.04*  CALCIUM 8.2* 8.5*    PT/INR: No results for input(s): LABPROT, INR in the last 72 hours. ABG    Component Value Date/Time   PHART 7.273* 09/21/2015 1628   HCO3 20.4 09/21/2015 1628   TCO2 22 09/21/2015 1628   ACIDBASEDEF 6.0* 09/21/2015 1628   O2SAT 84.0 09/21/2015 1628   CBG (last 3)   Recent Labs  10/01/15 2027 10/01/15 2239 10/02/15 0548  GLUCAP 242* 154* 138*    Assessment/Plan: S/P Procedure(s) (LRB): CORONARY ARTERY BYPASS  GRAFTING (CABG) TIMES FOUR USING BILATARAL SAPHENOUS VEIN GRAFTS AND LEFT INTERNAL MAMMARY ARTERY (N/A) TRANSESOPHAGEAL ECHOCARDIOGRAM (TEE) (N/A)  1. CV- NSR, BP a little improved- continue Lopressor, Norvasc 2. Pulm- wean oxygen as tolerated, continue nebs prn dyspnea, wheezing 3. Renal- creatinine remains stable at 2.04, remains hypervolemic continue lasix 4. Anemia- remains stable at 8.2 5. DM- sugars are better after addition of meal coverage, and decrease in PM dose of Levemir, continue current regimen 6. Deconditioning- needs SNF, insurance auth due to expire tomorrow 7. Dispo- patient is stable, some dyspnea yesterday improved with nebulizer treatments, BP improved, Blood sugars improved, will discuss with Dr. Servando Snare about discharge plans   LOS: 13 days    [place] Handler 10/02/2015  Feels better today Has not walked yet today Renal function stable at [place] transfer in   I have seen and examined [name] and agree with the above assessment  and plan.  [name] MD [place] [phone] Office [phone] 10/02/2015 2:00 PM

## 2015-10-03 DIAGNOSIS — I10 Essential (primary) hypertension: Secondary | ICD-10-CM | POA: Diagnosis not present

## 2015-10-03 DIAGNOSIS — D62 Acute posthemorrhagic anemia: Secondary | ICD-10-CM | POA: Diagnosis not present

## 2015-10-03 DIAGNOSIS — R262 Difficulty in walking, not elsewhere classified: Secondary | ICD-10-CM | POA: Diagnosis not present

## 2015-10-03 DIAGNOSIS — N183 Chronic kidney disease, stage 3 (moderate): Secondary | ICD-10-CM | POA: Diagnosis not present

## 2015-10-03 DIAGNOSIS — K219 Gastro-esophageal reflux disease without esophagitis: Secondary | ICD-10-CM | POA: Diagnosis not present

## 2015-10-03 DIAGNOSIS — R1312 Dysphagia, oropharyngeal phase: Secondary | ICD-10-CM | POA: Diagnosis not present

## 2015-10-03 DIAGNOSIS — Z48812 Encounter for surgical aftercare following surgery on the circulatory system: Secondary | ICD-10-CM | POA: Diagnosis not present

## 2015-10-03 DIAGNOSIS — E1122 Type 2 diabetes mellitus with diabetic chronic kidney disease: Secondary | ICD-10-CM | POA: Diagnosis not present

## 2015-10-03 DIAGNOSIS — E43 Unspecified severe protein-calorie malnutrition: Secondary | ICD-10-CM | POA: Diagnosis not present

## 2015-10-03 DIAGNOSIS — M6281 Muscle weakness (generalized): Secondary | ICD-10-CM | POA: Diagnosis not present

## 2015-10-03 DIAGNOSIS — J45909 Unspecified asthma, uncomplicated: Secondary | ICD-10-CM | POA: Diagnosis not present

## 2015-10-03 DIAGNOSIS — R5381 Other malaise: Secondary | ICD-10-CM | POA: Diagnosis not present

## 2015-10-03 DIAGNOSIS — E0865 Diabetes mellitus due to underlying condition with hyperglycemia: Secondary | ICD-10-CM | POA: Diagnosis not present

## 2015-10-03 DIAGNOSIS — E46 Unspecified protein-calorie malnutrition: Secondary | ICD-10-CM | POA: Diagnosis not present

## 2015-10-03 DIAGNOSIS — J4520 Mild intermittent asthma, uncomplicated: Secondary | ICD-10-CM | POA: Diagnosis not present

## 2015-10-03 DIAGNOSIS — D649 Anemia, unspecified: Secondary | ICD-10-CM | POA: Diagnosis not present

## 2015-10-03 DIAGNOSIS — M109 Gout, unspecified: Secondary | ICD-10-CM | POA: Diagnosis not present

## 2015-10-03 DIAGNOSIS — E1129 Type 2 diabetes mellitus with other diabetic kidney complication: Secondary | ICD-10-CM | POA: Diagnosis not present

## 2015-10-03 DIAGNOSIS — I25119 Atherosclerotic heart disease of native coronary artery with unspecified angina pectoris: Secondary | ICD-10-CM | POA: Diagnosis not present

## 2015-10-03 DIAGNOSIS — Z452 Encounter for adjustment and management of vascular access device: Secondary | ICD-10-CM | POA: Diagnosis not present

## 2015-10-03 DIAGNOSIS — N390 Urinary tract infection, site not specified: Secondary | ICD-10-CM | POA: Diagnosis not present

## 2015-10-03 DIAGNOSIS — R488 Other symbolic dysfunctions: Secondary | ICD-10-CM | POA: Diagnosis not present

## 2015-10-03 DIAGNOSIS — I200 Unstable angina: Secondary | ICD-10-CM | POA: Diagnosis not present

## 2015-10-03 DIAGNOSIS — R079 Chest pain, unspecified: Secondary | ICD-10-CM | POA: Diagnosis not present

## 2015-10-03 DIAGNOSIS — E785 Hyperlipidemia, unspecified: Secondary | ICD-10-CM | POA: Diagnosis not present

## 2015-10-03 DIAGNOSIS — E08319 Diabetes mellitus due to underlying condition with unspecified diabetic retinopathy without macular edema: Secondary | ICD-10-CM | POA: Diagnosis not present

## 2015-10-03 DIAGNOSIS — B372 Candidiasis of skin and nail: Secondary | ICD-10-CM | POA: Diagnosis not present

## 2015-10-03 DIAGNOSIS — Z951 Presence of aortocoronary bypass graft: Secondary | ICD-10-CM | POA: Diagnosis not present

## 2015-10-03 LAB — GLUCOSE, CAPILLARY
Glucose-Capillary: 158 mg/dL — ABNORMAL HIGH (ref 65–99)
Glucose-Capillary: 177 mg/dL — ABNORMAL HIGH (ref 65–99)

## 2015-10-03 MED ORDER — LEVALBUTEROL HCL 0.63 MG/3ML IN NEBU: 0.6300 mg | INHALATION_SOLUTION | Freq: Four times a day (QID) | RESPIRATORY_TRACT | Status: DC | PRN

## 2015-10-03 MED ORDER — METOPROLOL TARTRATE 25 MG PO TABS: 25.0000 mg | ORAL_TABLET | Freq: Two times a day (BID) | ORAL | Status: DC

## 2015-10-03 MED ORDER — ACETAMINOPHEN 325 MG PO TABS: 650.0000 mg | ORAL_TABLET | Freq: Four times a day (QID) | ORAL | Status: DC | PRN

## 2015-10-03 MED ORDER — ATORVASTATIN CALCIUM 40 MG PO TABS: 40.0000 mg | ORAL_TABLET | Freq: Every day | ORAL | Status: DC

## 2015-10-03 MED ORDER — ASPIRIN 81 MG PO TBEC: 81.0000 mg | DELAYED_RELEASE_TABLET | Freq: Every day | ORAL | Status: AC

## 2015-10-03 MED ORDER — TRAMADOL HCL 50 MG PO TABS: 50.0000 mg | ORAL_TABLET | Freq: Two times a day (BID) | ORAL | Status: DC | PRN

## 2015-10-03 NOTE — Progress Notes (Signed)
Patient will DC to: [facility] Anticipated DC date: 10/03/15 Family notified: Daughter Transport by: [facility]  CSW signing off.  Cedric Fishman, [place] Social Worker [phone]

## 2015-10-03 NOTE — Progress Notes (Signed)
Physical Therapy Treatment Patient Details Name: Monica [name] MRN: [id number] DOB: 1930-07-22 Today's Date: 10/03/2015    History of Present Illness Monica [name] is a 82 y.o. female with PMH of hypertension, hyperlipidemia, diabetes mellitus, asthma, GERD, gout, chronic kidney disease-stage III, peptic ulcer disease, who presents with chest pain. For cardiac cath 09/17/2015; underwent CABG x 4 on 09/19/15    PT Comments    Patient seems less confused today. Continues to require verbal reminders about sternal precautions and incorporating them into mobility. Gait speed seems to be improving as well as gait distance. Titrated supplemental 02 down to 3L during mobility and able to maintain in 90s. At 2L/min 02, pt drops to 87% but able to recover with cues for pursed lip breathing. Requires long rest breaks. Will continue to follow.   Follow Up Recommendations  SNF     Equipment Recommendations  None recommended by PT    Recommendations for Other Services       Precautions / Restrictions Precautions Precautions: Fall;Sternal Precaution Comments: Re-education required for sternal precautions Restrictions Other Position/Activity Restrictions: sternal precautions    Mobility  Bed Mobility               General bed mobility comments: Sitting in recliner chair upon PT arrival.   Transfers Overall transfer level: Needs assistance Equipment used: Rolling walker (2 wheeled) Transfers: Sit to/from Stand Sit to Stand: Min assist         General transfer comment: Min A to boost from low recliner with cues to hold pillow and use body momentum. Requires manual cues to hold pillow and not let go to push up from arm rests. Stood from [employer], from Youth worker.   Ambulation/Gait Ambulation/Gait assistance: Min guard Ambulation Distance (Feet): 175 Feet (+ 100') Assistive device: Rolling walker (2 wheeled) Gait Pattern/deviations: Step-through pattern;Decreased stride length;Trunk  flexed Gait velocity: decreased but improving.   General Gait Details: Slow, unsteady gait with 3/4 DOE. 1 seated rest break. Sp02 ranges from 87-95% on 2-4L/min 02. Titrated down to 3L.     Stairs            Wheelchair Mobility    Modified Rankin (Stroke Patients Only)       Balance Overall balance assessment: Needs assistance Sitting-balance support: Feet supported;No upper extremity supported Sitting balance-Leahy Scale: Fair     Standing balance support: During functional activity;Bilateral upper extremity supported Standing balance-Leahy Scale: Poor                      Cognition Arousal/Alertness: Awake/alert Behavior During Therapy: WFL for tasks assessed/performed Overall Cognitive Status: Impaired/Different from baseline Area of Impairment: Memory     Memory: Decreased short-term memory;Decreased recall of precautions Following Commands: Follows multi-step commands inconsistently;Follows multi-step commands with increased time       General Comments: Poor memory, not able to retain information learned.     Exercises      General Comments General comments (skin integrity, edema, etc.): Son present during session.      Pertinent Vitals/Pain Pain Assessment: Faces Faces Pain Scale: Hurts a little bit Pain Location: abdomen Pain Descriptors / Indicators: Sore Pain Intervention(s): Monitored during session;Repositioned    Home Living                      Prior Function            PT Goals (current goals can now be found in the care plan section)  Progress towards PT goals: Progressing toward goals    Frequency  Min 3X/week    PT Plan Current plan remains appropriate    Co-evaluation             End of Session Equipment Utilized During Treatment: Gait belt Activity Tolerance: Patient limited by fatigue Patient left: in chair;with call bell/phone within reach;with chair alarm set;with family/visitor present      Time: 1012-1040 PT Time Calculation (min) (ACUTE ONLY): 28 min  Charges:  $Gait Training: 8-22 mins $Therapeutic Activity: 8-22 mins                    G Codes:      Plummer Matich A Leiani Enright 10/03/2015, 10:56 AM Wray Kearns, [place], DPT [phone]

## 2015-10-03 NOTE — Progress Notes (Addendum)
      [address]       [place] [address]             [phone]      14 Days Post-Op Procedure(s) (LRB): CORONARY ARTERY BYPASS GRAFTING (CABG) TIMES FOUR USING BILATARAL SAPHENOUS VEIN GRAFTS AND LEFT INTERNAL MAMMARY ARTERY (N/A) TRANSESOPHAGEAL ECHOCARDIOGRAM (TEE) (N/A)   Subjective:  Monica [name] continues to feel better.  She did have an episode of shortness of breath overnight which resolved after breathing treatment.  She continues to ambulate with assistance.  Objective: Vital signs in last 24 hours: Temp:  [97.7 F (36.5 C)-98.5 F (36.9 C)] 97.7 F (36.5 C) (03/29 0610) Pulse Rate:  [79-92] 81 (03/29 0610) Cardiac Rhythm:  [-] Normal sinus rhythm (03/28 1933) Resp:  [18-20] 18 (03/29 0610) BP: (106-149)/(42-63) 149/57 mmHg (03/29 0610) SpO2:  [98 %-100 %] 100 % (03/29 0610) Weight:  [184 lb 4.9 oz (83.6 kg)] 184 lb 4.9 oz (83.6 kg) (03/29 0610)  Intake/Output from previous day: 03/28 0701 - 03/29 0700 In: 10 [I.V.:10] Out: -   General appearance: alert, cooperative and no distress Heart: regular rate and rhythm Lungs: clear to auscultation bilaterally Abdomen: soft, non-tender; bowel sounds normal; no masses,  no organomegaly Extremities: edema trace Wound: clean and dry  Lab Results:  Recent Labs  10/02/15 0430  WBC 13.8*  HGB 8.2*  HCT 27.1*  PLT 537*   BMET:  Recent Labs  10/02/15 0430  NA 140  K 5.0  CL 100*  CO2 32  GLUCOSE 121*  BUN 60*  CREATININE 2.04*  CALCIUM 8.5*    PT/INR: No results for input(s): LABPROT, INR in the last 72 hours. ABG    Component Value Date/Time   PHART 7.273* 09/21/2015 1628   HCO3 20.4 09/21/2015 1628   TCO2 22 09/21/2015 1628   ACIDBASEDEF 6.0* 09/21/2015 1628   O2SAT 84.0 09/21/2015 1628   CBG (last 3)   Recent Labs  10/02/15 1633 10/02/15 2113 10/03/15 0619  GLUCAP 253* 155* 158*    Assessment/Plan: S/P Procedure(s) (LRB): CORONARY ARTERY BYPASS GRAFTING (CABG) TIMES  FOUR USING BILATARAL SAPHENOUS VEIN GRAFTS AND LEFT INTERNAL MAMMARY ARTERY (N/A) TRANSESOPHAGEAL ECHOCARDIOGRAM (TEE) (N/A)  1. CV- hemodynamically stable continue Lopressor, Norvasc 2. Pulm- weaning oxygen as tolerated, occasional episodes of dyspnea relieved by Xopenex, continue aggressive pulm toilet 3. Renal- CKD Stg III- creatinine stable around 2, baseline is 1.7, remains hypervolemic continue lasix 4. DM- insulin has been adjusted, on meal coverage, pre op A1c was 8.2 5. Deconditioning- needs SNF 6. Dispo- patient stable, looks good today, continue to wean oxygen as tolerated, patient instructed to follow up with primary nephrologist at discharge, if ok with Dr. Servando Snare patient ready for SNF today.   LOS: 14 days    Ellwood Handler 10/03/2015  Walked better today Plan snf with pt today Renal function stable I have seen and examined Monica [name] and agree with the above assessment  and plan.  [name] MD Beeper [phone] Office [phone] 10/03/2015 2:14 PM

## 2015-10-03 NOTE — Progress Notes (Signed)
Pt. and son does not want insulin given patient feels CBG-is low but it is 177.

## 2015-10-03 NOTE — Discharge Instructions (Signed)
Coronary Artery Bypass Grafting, Care After °Refer to this sheet in the next few weeks. These instructions provide you with information on caring for yourself after your procedure. Your health care provider may also give you more specific instructions. Your treatment has been planned according to current medical practices, but problems sometimes occur. Call your health care provider if you have any problems or questions after your procedure. °WHAT TO EXPECT AFTER THE PROCEDURE °Recovery from surgery will be different for everyone. Some people feel well after 3 or 4 weeks, while for others it takes longer. After your procedure, it is typical to have the following: °· Nausea and a lack of appetite.   °· Constipation. °· Weakness and fatigue.   °· Depression or irritability.   °· Pain or discomfort at your incision site. °HOME CARE INSTRUCTIONS °· Take medicines only as directed by your health care provider. Do not stop taking medicines or start any new medicines without first checking with your health care provider. °· Take your pulse as directed by your health care provider. °· Perform deep breathing as directed by your health care provider. If you were given a device called an incentive spirometer, use it to practice deep breathing several times a day. Support your chest with a pillow or your arms when you take deep breaths or cough. °· Keep incision areas clean, dry, and protected. Remove or change any bandages (dressings) only as directed by your health care provider. You may have skin adhesive strips over the incision areas. Do not take the strips off. They will fall off on their own. °· Check incision areas daily for any swelling, redness, or drainage. °· If incisions were made in your legs, do the following: °¨ Avoid crossing your legs.   °¨ Avoid sitting for long periods of time. Change positions every 30 minutes.   °¨ Elevate your legs when you are sitting. °· Wear compression stockings as directed by your  health care provider. These stockings help keep blood clots from forming in your legs. °· Take showers once your health care provider approves. Until then, only take sponge baths. Pat incisions dry. Do not rub incisions with a washcloth or towel. Do not take baths, swim, or use a hot tub until your health care provider approves. °· Eat foods that are high in fiber, such as raw fruits and vegetables, whole grains, beans, and nuts. Meats should be lean cut. Avoid canned, processed, and fried foods. °· Drink enough fluid to keep your urine clear or pale yellow. °· Weigh yourself every day. This helps identify if you are retaining fluid that may make your heart and lungs work harder. °· Rest and limit activity as directed by your health care provider. You may be instructed to: °¨ Stop any activity at once if you have chest pain, shortness of breath, irregular heartbeats, or dizziness. Get help right away if you have any of these symptoms. °¨ Move around frequently for short periods or take short walks as directed by your health care provider. Increase your activities gradually. You may need physical therapy or cardiac rehabilitation to help strengthen your muscles and build your endurance. °¨ Avoid lifting, pushing, or pulling anything heavier than 10 lb (4.5 kg) for at least 6 weeks after surgery. °· Do not drive until your health care provider approves.  °· Ask your health care provider when you may return to work. °· Ask your health care provider when you may resume sexual activity. °· Keep all follow-up visits as directed by your health care   provider. This is important. °SEEK MEDICAL CARE IF: °· You have swelling, redness, increasing pain, or drainage at the site of an incision. °· You have a fever. °· You have swelling in your ankles or legs. °· You have pain in your legs.   °· You gain 2 or more pounds (0.9 kg) a day. °· You are nauseous or vomit. °· You have diarrhea.  °SEEK IMMEDIATE MEDICAL CARE IF: °· You have  chest pain that goes to your jaw or arms. °· You have shortness of breath.   °· You have a fast or irregular heartbeat.   °· You notice a "clicking" in your breastbone (sternum) when you move.   °· You have numbness or weakness in your arms or legs. °· You feel dizzy or light-headed.   °MAKE SURE YOU: °· Understand these instructions. °· Will watch your condition. °· Will get help right away if you are not doing well or get worse. °  °This information is not intended to replace advice given to you by your health care provider. Make sure you discuss any questions you have with your health care provider. °  °Document Released: 01/10/2005 Document Revised: 07/14/2014 Document Reviewed: 11/30/2012 °Elsevier Interactive Patient Education ©2016 [employer] ° °Endoscopic Saphenous Vein Harvesting, Care After °Refer to this sheet in the next few weeks. These instructions provide you with information on caring for yourself after your procedure. Your health care provider may also give you more specific instructions. Your treatment has been planned according to current medical practices, but problems sometimes occur. Call your health care provider if you have any problems or questions after your procedure. °HOME CARE INSTRUCTIONS °Medicine °· Take whatever pain medicine your surgeon prescribes. Follow the directions carefully. Do not take over-the-counter pain medicine unless your surgeon says it is okay. Some pain medicine can cause bleeding problems for several weeks after surgery. °· Follow your surgeon's instructions about driving. You will probably not be permitted to drive after heart surgery. °· Take any medicines your surgeon prescribes. Any medicines you took before your heart surgery should be checked with your health care provider before you start taking them again. °Wound care °· If your surgeon has prescribed an elastic bandage or stocking, ask how long you should wear it. °· Check the area around your surgical  cuts (incisions) whenever your bandages (dressings) are changed. Look for any redness or swelling. °· You will need to return to have the stitches (sutures) or staples taken out. Ask your surgeon when to do that. °· Ask your surgeon when you can shower or bathe. °Activity °· Try to keep your legs raised when you are sitting. °· Do any exercises your health care providers have given you. These may include deep breathing exercises, coughing, walking, or other exercises. °SEEK MEDICAL CARE IF: °· You have any questions about your medicines. °· You have more leg pain, especially if your pain medicine stops working. °· New or growing bruises develop on your leg. °· Your leg swells, feels tight, or becomes red. °· You have numbness in your leg. °SEEK IMMEDIATE MEDICAL CARE IF: °· Your pain gets much worse. °· Blood or fluid leaks from any of the incisions. °· Your incisions become warm, swollen, or red. °· You have chest pain. °· You have trouble breathing. °· You have a fever. °· You have more pain near your leg incision. °MAKE SURE YOU: °· Understand these instructions. °· Will watch your condition. °· Will get help right away if you are not doing well or   get worse. °  °This information is not intended to replace advice given to you by your health care provider. Make sure you discuss any questions you have with your health care provider. °  °Document Released: 03/05/2011 Document Revised: 07/14/2014 Document Reviewed: 03/05/2011 °Elsevier Interactive Patient Education ©2016 Elsevier Inc. ° ° °

## 2015-10-03 NOTE — Progress Notes (Signed)
Cardiologist: Kathrin Ruddy saw in hospital after initial consult) Subjective:  Some shortness of breath, no chest pain  Objective:  Vital Signs in the last 24 hours: Temp:  [97.7 F (36.5 C)-98.5 F (36.9 C)] 97.7 F (36.5 C) (03/29 0610) Pulse Rate:  [76-92] 76 (03/29 0925) Resp:  [18-20] 18 (03/29 0610) BP: (106-149)/(42-63) 127/52 mmHg (03/29 0925) SpO2:  [98 %-100 %] 100 % (03/29 0610) Weight:  [184 lb 4.9 oz (83.6 kg)] 184 lb 4.9 oz (83.6 kg) (03/29 0610)  Intake/Output from previous day: 03/28 0701 - 03/29 0700 In: 10 [I.V.:10] Out: -    Physical Exam: General: Well developed, well nourished, in no acute distress. Head:  Normocephalic and atraumatic. Lungs: Clear to auscultation and percussion. Heart: Normal S1 and S2.  No murmur, rubs or gallops. Scar Abdomen: soft, non-tender, positive bowel sounds. Extremities: No clubbing or cyanosis. No edema. Neurologic: Alert and oriented x 3.    Lab Results:  Recent Labs  10/02/15 0430  WBC 13.8*  HGB 8.2*  PLT 537*    Recent Labs  10/02/15 0430  NA 140  K 5.0  CL 100*  CO2 32  GLUCOSE 121*  BUN 60*  CREATININE 2.04*   No results for input(s): TROPONINI in the last 72 hours.  Invalid input(s): CK, MB Hepatic Function Panel No results for input(s): PROT, ALBUMIN, AST, ALT, ALKPHOS, BILITOT, BILIDIR, IBILI in the last 72 hours. No results for input(s): CHOL in the last 72 hours. No results for input(s): PROTIME in the last 72 hours.    Telemetry: no adverse arrhythmias  Personally viewed.    Cardiac Studies:  EF 70%  Meds: Scheduled Meds: . amLODipine  10 mg Oral Daily  . aspirin EC  81 mg Oral Daily  . atorvastatin  40 mg Oral q1800  . enoxaparin (LOVENOX) injection  30 mg Subcutaneous QHS  . feeding supplement (GLUCERNA SHAKE)  237 mL Oral BID BM  . furosemide  40 mg Intravenous Daily  . insulin aspart  0-24 Units Subcutaneous TID AC & HS  . insulin aspart  5 Units Subcutaneous TID WC  .  insulin detemir  10 Units Subcutaneous BID  . metoprolol tartrate  25 mg Oral BID  . moving right along book   Does not apply Once  . pantoprazole  40 mg Oral QAC breakfast  . sodium chloride flush  10-40 mL Intracatheter Q12H  . sodium chloride flush  3 mL Intravenous Q12H   Continuous Infusions:  PRN Meds:.sodium chloride, acetaminophen, bisacodyl **OR** bisacodyl, levalbuterol, ondansetron **OR** ondansetron (ZOFRAN) IV, RESOURCE THICKENUP CLEAR, sodium chloride flush, sodium chloride flush, traMADol  Assessment/Plan:  Principal Problem:   Unstable angina (HCC) Active Problems:   Accelerated hypertension   Hyperlipidemia   Diabetes mellitus with renal complications (HCC)   Asthma   CKD (chronic kidney disease), stage III   GERD (gastroesophageal reflux disease)   Gout   Chest pain   Abnormal finding on EKG - dynamic changes   Pain in the chest   Essential hypertension   S/P CABG x 4   Altered mental status   Coronary artery disease, CABG 4 -LIMA to LAD -SVG to ramus -SVG to obtuse marginal -SVG to distal RCA  Progressing. [facility], deconditioning. Aspirin, beta blocker, statin, EF 65-70%.  Hyperlipidemia -Atorvastatin 40 mg, excellent  Hypertensive heart disease with heart failure - under good control. -IV furosemide administered. -Creatinine baseline 1.7. -Dyspnea relieved with beta agonist, pulmonary toilet.   SKAINS, MARK  10/03/2015, 10:09 AM

## 2015-10-03 NOTE — Progress Notes (Signed)
Called report to [place] at [facility]. Transport taking patient to [facility].

## 2015-10-04 ENCOUNTER — Encounter: Payer: Self-pay | Admitting: Adult Health

## 2015-10-04 ENCOUNTER — Non-Acute Institutional Stay: Payer: Medicare HMO | Admitting: Adult Health

## 2015-10-04 DIAGNOSIS — N183 Chronic kidney disease, stage 3 unspecified: Secondary | ICD-10-CM

## 2015-10-04 DIAGNOSIS — E785 Hyperlipidemia, unspecified: Secondary | ICD-10-CM

## 2015-10-04 DIAGNOSIS — E1122 Type 2 diabetes mellitus with diabetic chronic kidney disease: Secondary | ICD-10-CM

## 2015-10-04 DIAGNOSIS — D62 Acute posthemorrhagic anemia: Secondary | ICD-10-CM

## 2015-10-04 DIAGNOSIS — I10 Essential (primary) hypertension: Secondary | ICD-10-CM

## 2015-10-04 DIAGNOSIS — E43 Unspecified severe protein-calorie malnutrition: Secondary | ICD-10-CM

## 2015-10-04 DIAGNOSIS — R5381 Other malaise: Secondary | ICD-10-CM

## 2015-10-04 DIAGNOSIS — I25119 Atherosclerotic heart disease of native coronary artery with unspecified angina pectoris: Secondary | ICD-10-CM

## 2015-10-04 DIAGNOSIS — Z794 Long term (current) use of insulin: Secondary | ICD-10-CM | POA: Diagnosis not present

## 2015-10-04 DIAGNOSIS — J4520 Mild intermittent asthma, uncomplicated: Secondary | ICD-10-CM | POA: Diagnosis not present

## 2015-10-04 NOTE — Clinical Social Work Placement (Signed)
   CLINICAL SOCIAL WORK PLACEMENT  NOTE  Date:  10/04/2015  Patient Details  Name: Monica [name] MRN: [id number] Date of Birth: 05-Mar-1931  Clinical Social Work is seeking post-discharge placement for this patient at the [place] level of care (*CSW will initial, date and re-position this form in  chart as items are completed):  Yes   Patient/family provided with [facility] Work Department's list of facilities offering this level of care within the geographic area requested by the patient (or if unable, by the patient's family).  Yes   Patient/family informed of their freedom to choose among providers that offer the needed level of care, that participate in Medicare, Medicaid or managed care program needed by the patient, have an available bed and are willing to accept the patient.  Yes   Patient/family informed of [facility]'s ownership interest in [facility] and [facility], as well as of the fact that they are under no obligation to receive care at these facilities.  PASRR submitted to EDS on 09/28/15     PASRR number received on 09/28/15     Existing PASRR number confirmed on       FL2 transmitted to all facilities in geographic area requested by pt/family on 09/28/15     FL2 transmitted to all facilities within larger geographic area on       Patient informed that his/her managed care company has contracts with or will negotiate with certain facilities, including the following:        Yes   Patient/family informed of bed offers received.  Patient chooses bed at [facility]     Physician recommends and patient chooses bed at      Patient to be transferred to [facility] on 10/04/15.  Patient to be transferred to facility by [facility]     Patient family notified on 10/04/15 of transfer.  Name of family member notified:  Daughter     PHYSICIAN       Additional Comment:    _______________________________________________ Benard Halsted, [place] 10/04/2015, 7:39 AM

## 2015-10-04 NOTE — Progress Notes (Signed)
Patient ID: Monica [name], female   DOB: Aug 30, 1930, 82 y.o.   MRN: [id number]    DATE:  10/04/15  MRN:  [id number]  BIRTHDAY: 03-Nov-1930  Facility:  Nursing Home Location:  [facility] Room Number: 908-P  LEVEL OF CARE:  SNF [phone])  Contact Information    Name Relation Home Work [place] Son [phone]     [name], [name] [phone]     [name] Daughter [phone]         Code Status History    Date Active Date Inactive Code Status Order ID Comments User Context   09/15/2015  3:24 AM 09/19/2015  3:48 PM Full Code [id number]  Ivor Costa, MD Inpatient       Chief Complaint  Patient presents with  . Hospitalization Follow-up    HISTORY OF PRESENT ILLNESS:   This is an 82 year old female who has been admitted to [facility] on 10/03/15 from [facility]. She has PMH of hypertension, hyperlipidemia, diabetes mellitus, asthma, GERD, gout, chronic kidney disease-stage III and peptic ulcer disease.  She was having chest pains and had cardiac catheterization on 09/17/15. This revealed severe 3 vessel CAD. She then underwent CABG 4 utilizing LIMA to LAD, SVG to ramus intermediate SVG to OM and SVG to distal RCA. She also underwent an endoscopic saphenous vein harvest from the left and right leg. She was transfused 1 unit of packed RBC for expected postoperative blood loss anemia. Speech swallow evaluation was obtained and showed swallowing dysfunction. She initially was kept nothing by mouth and required placement of a panda feeding tube. She developed some acute mental status changes. Modified barium swallow was completed and it was recommended for the patient to be placed on a modified diet. It was felt her changes were likely due to acute embolic encephalopathy. She continued to make progress. Her mental status improved and her swallowing dysfunction and proved.  She has been admitted for a short-term rehabilitation.   PAST  MEDICAL HISTORY:  Past Medical History  Diagnosis Date  . Peptic ulcer   . Mass   . Essential hypertension   . Hyperlipidemia   . Type 2 diabetes mellitus ([place])   . Asthma   . History of blood transfusion   . CKD (chronic kidney disease), stage III      CURRENT MEDICATIONS: Reviewed  Patient's Medications  New Prescriptions   No medications on file  Previous Medications   ACETAMINOPHEN (TYLENOL) 325 MG TABLET    Take 2 tablets (650 mg total) by mouth every 6 (six) hours as needed for mild pain.   ALBUTEROL (PROVENTIL HFA;VENTOLIN HFA) 108 (90 BASE) MCG/ACT INHALER    Inhale 2 puffs into the lungs every 6 (six) hours as needed for wheezing or shortness of breath.   AMLODIPINE (NORVASC) 10 MG TABLET    Take 10 mg by mouth daily.   ASPIRIN EC 81 MG EC TABLET    Take 1 tablet (81 mg total) by mouth daily.   ATORVASTATIN (LIPITOR) 40 MG TABLET    Take 1 tablet (40 mg total) by mouth daily at 6 PM.   CALCIUM CARB-CHOLECALCIFEROL [phone] MG-UNIT CAPS    Take 1 tablet by mouth daily.   CALCIUM CARBONATE (TUMS - DOSED IN MG ELEMENTAL CALCIUM) 500 MG CHEWABLE TABLET    Chew 1 tablet by mouth daily as needed for indigestion or heartburn.   CHOLECALCIFEROL (VITAMIN D-3 PO)    Take 1 capsule by mouth daily.  DIPHENHYDRAMINE (BENADRYL) 25 MG TABLET    Take 25 mg by mouth as needed for itching or allergies.    FUROSEMIDE (LASIX) 20 MG TABLET    Take 20 mg by mouth every other day.    INSULIN ASPART (NOVOLOG) 100 UNIT/ML INJECTION    Inject 0-24 Units into the skin. 0-24 units TID AC and HS as follows: 0-60 = 0 units, hypoglycemic protocol and call MD; 61-119 = 0 units; 120-160 = 2 units; 161-200 =4 units, 201-250 = 8 units, 251-300 = 12 units, 301-350 = 16 units, 351-450 = 20 units, >450 = 24 units and call MD   INSULIN GLARGINE (LANTUS) 100 UNIT/ML INJECTION    Inject 20 Units into the skin 2 (two) times daily.    IRON CR PO    Take 65 mg by mouth daily.    LEVALBUTEROL (XOPENEX) 0.63 MG/3ML  NEBULIZER SOLUTION    Take 3 mLs (0.63 mg total) by nebulization every 6 (six) hours as needed for wheezing or shortness of breath.   MECLIZINE (ANTIVERT) 12.5 MG TABLET    Take 12.5 mg by mouth 3 (three) times daily as needed for dizziness.   METOPROLOL TARTRATE (LOPRESSOR) 25 MG TABLET    Take 1 tablet (25 mg total) by mouth 2 (two) times daily.   TRAMADOL (ULTRAM) 50 MG TABLET    Take 1 tablet (50 mg total) by mouth every 12 (twelve) hours as needed for moderate pain.  Modified Medications   No medications on file  Discontinued Medications   CALCIUM CITRATE-VITAMIN D (CALCIUM + D PO)    Take 1 tablet by mouth daily.   INSULIN LISPRO (HUMALOG) 100 UNIT/ML CARTRIDGE    Inject 5-10 Units into the skin 3 (three) times daily between meals as needed (Per sliding scale).     No Known Allergies   REVIEW OF SYSTEMS:  GENERAL: no change in appetite, no fatigue, no weight changes, no fever, chills or weakness EYES: Denies change in vision, dry eyes, eye pain, itching or discharge EARS: Denies change in hearing, ringing in ears, or earache NOSE: Denies nasal congestion or epistaxis MOUTH and THROAT: Denies oral discomfort, gingival pain or bleeding, pain from teeth or hoarseness   RESPIRATORY: no cough, SOB, DOE, wheezing, hemoptysis CARDIAC: no chest pain, edema or palpitations GI: no abdominal pain, diarrhea, constipation, heart burn, nausea or vomiting GU: Denies dysuria, frequency, hematuria, incontinence, or discharge PSYCHIATRIC: Denies feeling of depression or anxiety. No report of hallucinations, insomnia, paranoia, or agitation   PHYSICAL EXAMINATION  GENERAL APPEARANCE: Well nourished. In no acute distress. Obese SKIN:  Chest midline incision is dry, no erythema; has SL central line on left chest HEAD: Normal in size and contour. No evidence of trauma EYES: Lids open and close normally. No blepharitis, entropion or ectropion. PERRL. Conjunctivae are clear and sclerae are white.  Lenses are without opacity EARS: Pinnae are normal. Patient hears normal voice tunes of the examiner MOUTH and THROAT: Lips are without lesions. Oral mucosa is moist and without lesions. Tongue is normal in shape, size, and color and without lesions NECK: supple, trachea midline, no neck masses, no thyroid tenderness, no thyromegaly LYMPHATICS: no LAN in the neck, no supraclavicular LAN RESPIRATORY: breathing is even & unlabored, BS CTAB CARDIAC: RRR, no murmur,no extra heart sounds, no edema GI: abdomen soft, normal BS, no masses, no tenderness, no hepatomegaly, no splenomegaly EXTREMITIES:  Able to move X 4 extremities PSYCHIATRIC: Alert and oriented X 3. Affect and behavior are appropriate  LABS/RADIOLOGY: Labs reviewed: Basic Metabolic Panel:  Recent Labs  09/19/15 2130 09/20/15 0400  09/20/15 1630  09/29/15 0425 09/30/15 0445 10/02/15 0430  NA  --  140  < >  --   < > 143 144 140  K  --  4.3  < >  --   < > 4.0 4.2 5.0  CL  --  107  < >  --   < > 102 102 100*  CO2  --  22  --   --   < > 31 32 32  GLUCOSE  --  118*  < >  --   < > 124* 70 121*  BUN  --  20  < >  --   < > 62* 59* 60*  CREATININE 1.61* 1.98*  < > 2.63*  < > 2.02* 2.00* 2.04*  CALCIUM  --  7.1*  --   --   < > 8.1* 8.2* 8.5*  MG 2.1 2.0  --  2.1  --   --   --   --   < > = values in this interval not displayed. Liver Function Tests:  Recent Labs  09/23/15 0421 09/24/15 0419 09/25/15 0417  AST 24 24 58*  ALT 30 29 44  ALKPHOS 63 50 68  BILITOT 0.4 0.6 0.4  PROT 5.3* 5.7* 5.7*  ALBUMIN 2.0* 2.1* 2.0*    CBC:  Recent Labs  09/28/15 0503 09/29/15 0425 10/02/15 0430  WBC 13.0* 13.8* 13.8*  HGB 8.1* 8.4* 8.2*  HCT 26.0* 25.6* 27.1*  MCV 90.3 90.8 92.8  PLT 474* 489* 537*   Lipid Panel:  Recent Labs  09/15/15 0343  HDL 35*   Cardiac Enzymes:  Recent Labs  09/15/15 0343 09/15/15 0818 09/15/15 1454  TROPONINI 0.03 <0.03 <0.03    CBG:  Recent Labs  10/02/15 2113 10/03/15 0619  10/03/15 1117  GLUCAP 155* 158* 177*      Dg Chest 2 View  10/01/2015  CLINICAL DATA:  Pleural effusions.  Weakness.  Shortness of breath. EXAM: CHEST  2 VIEW COMPARISON:  09/28/2015. FINDINGS: Left IJ line stable position. Mediastinum hilar structures are normal. Prior CABG. Stable cardiomegaly. Improving pulmonary venous congestion and interstitial prominence consistent with improving congestive heart failure. Low lung volumes with basilar atelectasis. Small stable left pleural effusion. IMPRESSION: 1. Left IJ line stable position. 2. Prior CABG. Cardiomegaly with interim resolution of pulmonary vascular prominence and improving interstitial prominence suggesting improving congestive heart failure. 3. Low lung volumes with mild bibasilar atelectasis and/or residual infiltrates. Small unchanged left pleural effusion again noted. Electronically Signed   By: Marcello Moores  Register   On: 10/01/2015 07:39   Dg Chest 2 View  09/14/2015  CLINICAL DATA:  Chest pain just prior to arrival. EXAM: CHEST  2 VIEW COMPARISON:  11/22/2012 FINDINGS: Stable mediastinal contours with borderline cardiomegaly. No pulmonary edema, confluent airspace disease, pleural effusion or pneumothorax. Exaggerated thoracic kyphosis and degenerative change throughout the thoracic spine, stable. IMPRESSION: Unchanged appearance of the chest.  No acute process. Electronically Signed   By: Jeb Levering M.D.   On: 09/14/2015 23:04   Ct Head Wo Contrast  09/24/2015  CLINICAL DATA:  Confusion, swallowing difficulty, status post CABG 5 days ago EXAM: CT HEAD WITHOUT CONTRAST TECHNIQUE: Contiguous axial images were obtained from the base of the skull through the vertex without intravenous contrast. COMPARISON:  None. FINDINGS: Motion degraded images. No evidence of parenchymal hemorrhage or extra-axial fluid collection. No mass lesion, mass effect, or  midline shift. No CT evidence of acute infarction. Old left basal ganglia lacunar infarct.  Subcortical white matter and periventricular small vessel ischemic changes. Intracranial atherosclerosis. Global cortical and central atrophy.  No ventriculomegaly. Partial opacification of the right ethmoid sinus. The mastoid air cells are unopacified. Right nasal tube. No evidence of calvarial fracture. IMPRESSION: No evidence of acute intracranial abnormality. Old left basal ganglia lacunar infarct. Atrophy with small vessel ischemic changes. Electronically Signed   By: Julian Hy M.D.   On: 09/24/2015 10:51   Ir US Guide Vasc Access Left  09/24/2015  CLINICAL DATA:  Altered mental status, renal failure, needs venous access for infusion therapy EXAM: TUNNELED CENTRAL VENOUS CATHETER PLACEMENT WITH ULTRASOUND AND FLUOROSCOPIC GUIDANCE TECHNIQUE: The procedure, risks, benefits, and alternatives were explained to the family. Questions regarding the procedure were encouraged and answered. The family understands and consents to the procedure. An appropriate skin site was determined. Region was prepped using maximum barrier technique including cap and mask, sterile gown, sterile gloves, large sterile sheet, and Chlorhexidine as cutaneous antisepsis. The region was infiltrated locally with 1% lidocaine. Left IJ approach was selected because of an indwelling right jugular catheter. Under real-time ultrasound guidance, the left IJ vein was accessed with a 21 gauge micropuncture needle; the needle tip within the vein was confirmed with ultrasound image documentation. 64F dual-lumen cuffed powerPICC tunneled from a righ left t anterior chest wall approach to the dermatotomy site. Needle exchanged over the 018 guidewire for transitional dilator, through which the catheter which had been cut to 28 cm was advanced under intermittent fluoroscopy, positioned with its tip at the cavoatrial junction. Spot chest radiograph confirms good catheter position. No pneumothorax. Catheter was flushed per protocol. Catheter secured  externally with O Prolene suture. The left IJ dermatotomy site was closed with Dermabond. COMPLICATIONS: COMPLICATIONS None immediate FLUOROSCOPY TIME:  30 seconds, 3 mGy COMPARISON:  None IMPRESSION: 1. Technically successful placement of tunneled left IJ tunneled dual-lumen power injectable catheter with ultrasound and fluoroscopic guidance. Ready for routine use. Electronically Signed   By: Lucrezia Europe M.D.   On: 09/24/2015 15:16   Dg Chest Port 1 View  09/28/2015  CLINICAL DATA:  Shortness of Breath EXAM: PORTABLE CHEST 1 VIEW COMPARISON:  09/26/2015 FINDINGS: Cardiac shadow remains enlarged. Postsurgical changes are again seen. A left jugular PICC line is again identified with the catheter tip in the proximal superior vena cava. It appears to have withdrawn slightly from the prior exam. Persistent left basilar infiltrate and effusion are noted. Small right-sided pleural effusion remains. No other focal abnormality is noted. IMPRESSION: Stable bibasilar changes. Electronically Signed   By: Inez Catalina M.D.   On: 09/28/2015 07:49   Dg Chest Port 1 View  09/26/2015  CLINICAL DATA:  CHF, left pleural effusion. EXAM: PORTABLE CHEST 1 VIEW COMPARISON:  Portable chest x-ray of September 25, 2015 FINDINGS: The lungs are adequately inflated. There is persistent left lower lobe atelectasis or pneumonia. There are small bilateral pleural effusions greater on the left than on the right. The cardiac silhouette remains enlarged. The pulmonary vascularity remains engorged. The pulmonary interstitial markings are slightly less conspicuous today. The patient has undergone previous CABG. The left internal jugular venous catheter tip projects over the proximal SVC. IMPRESSION: Slight interval improvement in pulmonary interstitial edema. There remains left basilar atelectasis and mild CHF with small bilateral pleural effusions. Electronically Signed   By: David  Martinique M.D.   On: 09/26/2015 07:53   Dg Chest Port 1  View  09/25/2015  CLINICAL DATA:  Shortness of breath, history of asthma, Coronary artery disease, previous CABG, diabetes. EXAM: PORTABLE CHEST 1 VIEW COMPARISON:  Portable chest x-ray of September 24, 2015 FINDINGS: The lungs are borderline hypoinflated. The pulmonary interstitial markings remain increased. The left hemidiaphragm remains obscured. The cardiac silhouette remains enlarged and the pulmonary vascularity engorged. The feeding tube tip projects below the inferior margin of the image. The left internal jugular venous catheter tip projects over the proximal SVC. The bony thorax exhibits no acute abnormality. IMPRESSION: CHF with pulmonary interstitial edema with stable left basilar atelectasis and small effusion. No significant change since yesterday's study. Electronically Signed   By: David  Martinique M.D.   On: 09/25/2015 08:01   Dg Chest Port 1 View  09/24/2015  CLINICAL DATA:  Pulmonary atelectasis.  Shortness of breath today. EXAM: PORTABLE CHEST 1 VIEW COMPARISON:  09/23/2015 FINDINGS: Right internal jugular venous access sheath remains in place. There has been previous median sternotomy and CABG. There continues to be left lower lobe collapse with a small amount of pleural fluid on the left. Mild volume loss at the right base. Possible pulmonary venous hypertension. IMPRESSION: No change.  Persistent left lower lobe collapse and left effusion. Electronically Signed   By: Nelson Chimes M.D.   On: 09/24/2015 07:38   Dg Chest Port 1 View  09/23/2015  CLINICAL DATA:  Atelectasis EXAM: PORTABLE CHEST 1 VIEW COMPARISON:  Chest radiograph from one day prior. FINDINGS: Right internal jugular central venous sheath terminates in the right brachiocephalic vein. Sternotomy wires appear aligned and intact. Stable cardiomediastinal silhouette with mild-to-moderate cardiomegaly. No pneumothorax. Stable small left pleural effusion. Mild-to-moderate pulmonary edema is not appreciably changed. Left greater than  right lung base opacities, slightly decreased on the right and stable on the left. IMPRESSION: 1. Stable mild-to-moderate congestive heart failure. 2. Stable small left pleural effusion. 3. Bibasilar lung opacities, left greater than right, slightly improved on the right and stable on the left, favor atelectasis. Electronically Signed   By: Ilona Sorrel M.D.   On: 09/23/2015 08:09   Dg Chest Port 1 View  09/22/2015  CLINICAL DATA:  Chest tubes removed per RN. EXAM: PORTABLE CHEST 1 VIEW COMPARISON:  Chest radiograph from one day prior. FINDINGS: Sternotomy wires appear aligned and intact. Right internal jugular central venous sheath terminates in the upper third of the superior vena cava. Slightly low lung volumes. Stable cardiomediastinal silhouette with mild cardiomegaly. No pneumothorax. Stable small left pleural effusion. Mild-to-moderate pulmonary edema appears slightly worsened. Bilateral lower lobe opacities appear unchanged, favor atelectasis. IMPRESSION: 1. Mild-to-moderate congestive heart failure, slightly worsened. 2. Stable small left pleural effusion. 3. Stable bilateral lower lobe opacities, favor atelectasis. Electronically Signed   By: Ilona Sorrel M.D.   On: 09/22/2015 10:08   Dg Chest Port 1 View  09/21/2015  CLINICAL DATA:  Hypertension.  Diabetes.  Post CABG. EXAM: PORTABLE CHEST 1 VIEW COMPARISON:  09/20/2015; 09/19/2015; 09/14/2015 FINDINGS: Grossly unchanged enlarged cardiac silhouette and mediastinal contours given patient rotation. Interval removal of right jugular approach PA catheter with remaining vascular sheath tip overlying the mid SVC. No pneumothorax. Mild pulmonary venous congestion without frank evidence of edema. There is a minimal amount of pleural parenchymal thickening about the right minor fissure. Trace bilateral effusions are not excluded. Bibasilar heterogeneous/consolidative opacities are unchanged. No new focal airspace opacities. Unchanged bones. IMPRESSION: 1.  Remaining support apparatus as above.  No pneumothorax. 2. Pulmonary is congestion without frank evidence of edema. 3.  Unchanged bibasilar opacities, left greater than right, likely atelectasis. Electronically Signed   By: Sandi Mariscal M.D.   On: 09/21/2015 07:47   Dg Chest Port 1 View  09/20/2015  CLINICAL DATA:  Post CABG, hypertension, type II diabetes mellitus, asthma EXAM: PORTABLE CHEST 1 VIEW COMPARISON:  Portable exam 0628 hours compared to 09/19/2015 FINDINGS: Swan-Ganz catheter tip projects over RIGHT pulmonary artery. LEFT thoracostomy tube stable. Enlargement of cardiac silhouette post CABG. Atherosclerotic calcification aorta. Bibasilar atelectasis. Significant portions of the upper lobes and lung apices obscured by patient's head. No gross pleural effusion. Unable to assess for pneumothorax due to obscuration of the upper lobes. IMPRESSION: Bibasilar atelectasis. Enlargement of cardiac silhouette post CABG. Electronically Signed   By: Lavonia Dana M.D.   On: 09/20/2015 08:06   Dg Chest Port 1 View  09/19/2015  CLINICAL DATA:  Coronary artery disease.  Status post CABG. EXAM: PORTABLE CHEST 1 VIEW COMPARISON:  09/14/2015 FINDINGS: Endotracheal tube and NG tube and chest tubes appear in good position. Swan-Ganz catheter tip is in the right main pulmonary artery. The lungs are clear. Very tiny left apical pneumothorax. Heart size and vascularity are normal. Aortic atherosclerosis. IMPRESSION: Very tiny left apical pneumothorax. Tubes and lines appear in good position. Electronically Signed   By: Lorriane Shire M.D.   On: 09/19/2015 16:39   Dg Abd Portable 1v  09/24/2015  CLINICAL DATA:  Feeding tube placement EXAM: PORTABLE ABDOMEN - 1 VIEW COMPARISON:  Nine/ 19/15 FINDINGS: There is normal small bowel gas pattern. The patient is status post CABG. There is and she feeding tube with tip in and distal stomach. IMPRESSION: NG feeding tube with tip in distal stomach. Electronically Signed   By: Lahoma Crocker M.D.   On: 09/24/2015 11:12   Dg Swallowing Func-speech Pathology  09/26/2015  Objective Swallowing Evaluation: Type of Study: MBS-Modified Barium Swallow Study Patient Details Name: Adaleia Civello MRN: [id number] Date of Birth: 05-02-1931 Today's Date: 09/26/2015 Time: SLP Start Time (ACUTE ONLY): 1405-SLP Stop Time (ACUTE ONLY): 1422 SLP Time Calculation (min) (ACUTE ONLY): 17 min Past Medical History: Past Medical History Diagnosis Date . Peptic ulcer  . Mass  . Essential hypertension  . Hyperlipidemia  . Type 2 diabetes mellitus ([place])  . Asthma  . History of blood transfusion  . CKD (chronic kidney disease), stage III  Past Surgical History: Past Surgical History Procedure Laterality Date . Abdominal hysterectomy   . Colon surgery   . Cardiac catheterization N/A 09/17/2015   Procedure: Left Heart Cath and Coronary Angiography;  Surgeon: Jettie Booze, MD;  Location: [facility];  Service: Cardiovascular;  Laterality: N/A; . Coronary artery bypass graft N/A 09/19/2015   Procedure: CORONARY ARTERY BYPASS GRAFTING (CABG) TIMES FOUR USING BILATARAL SAPHENOUS VEIN GRAFTS AND LEFT INTERNAL MAMMARY ARTERY;  Surgeon: [name], MD;  Location: [facility];  Service: Open Heart Surgery;  Laterality: N/A; . Tee without cardioversion N/A 09/19/2015   Procedure: TRANSESOPHAGEAL ECHOCARDIOGRAM (TEE);  Surgeon: [name], MD;  Location: [facility];  Service: Open Heart Surgery;  Laterality: N/A; HPI: Idabelle Prickett is a 82 y.o. female with PMH of hypertension, hyperlipidemia, diabetes mellitus, asthma, GERD, gout, chronic kidney disease-stage III, peptic ulcer disease, who presents with chest pain.  The patient is s/p CABG x 4 and per nursing has been coughing with intake.  Per nursing the patient was relatively independent prior to admission with not reported swallowing issues.  Current chest xray is showing stable mild-to-moderate congestive  heart failure, stable small left pleural effusion and bibasilar lung  opacities, left greater than right, slightly Subjective: pt alert, daughter says she is clearing her throat throughout the day since surgery Assessment / Plan / Recommendation CHL IP CLINICAL IMPRESSIONS 09/26/2015 Therapy Diagnosis Moderate oral phase dysphagia;Mild pharyngeal phase dysphagia;Mild oral phase dysphagia Clinical Impression Pt has a mild-moderate oropharyngeal dysphagia, although with overall visibility reduced due to shoulders/body habitus limiting view. She does have slow oral transit with premature spillage to the valleculae. Mild residue remained in the valleculae post-swallow with trace, shallow penetration observed x1 after the swallow with thin liquids. Pt cleared with cued throat clear. Although it was difficult to visualize the upper esophagus, there did appear to be backflow to the level of the UES x1. Throat clearing occurred throughout the duration of testing but was not correlated with airway compromise. Recommend to continue with current textures. Impact on safety and function Mild aspiration risk;Moderate aspiration risk   CHL IP TREATMENT RECOMMENDATION 09/26/2015 Treatment Recommendations Therapy as outlined in treatment plan below   Prognosis 09/26/2015 Prognosis for Safe Diet Advancement Good Barriers to Reach Goals Cognitive deficits Barriers/Prognosis Comment -- CHL IP DIET RECOMMENDATION 09/26/2015 SLP Diet Recommendations Dysphagia 1 (Puree) solids;Nectar thick liquid Liquid Administration via Cup;Straw Medication Administration Crushed with puree Compensations Minimize environmental distractions;Slow rate;Small sips/bites Postural Changes Remain semi-upright after after feeds/meals (Comment);Seated upright at 90 degrees   CHL IP OTHER RECOMMENDATIONS 09/26/2015 Recommended Consults -- Oral Care Recommendations Oral care BID Other Recommendations Order thickener from pharmacy;Prohibited food (jello, ice cream, thin soups);Remove water pitcher   CHL IP FOLLOW UP RECOMMENDATIONS  09/26/2015 Follow up Recommendations Skilled Nursing facility   [facility] IP FREQUENCY AND DURATION 09/26/2015 Speech Therapy Frequency (ACUTE ONLY) min 2x/week Treatment Duration 2 weeks      CHL IP ORAL PHASE 09/26/2015 Oral Phase Impaired Oral - Pudding Teaspoon -- Oral - Pudding Cup -- Oral - Honey Teaspoon -- Oral - Honey Cup -- Oral - Nectar Teaspoon -- Oral - Nectar Cup Delayed oral transit;Premature spillage;Weak lingual manipulation Oral - Nectar Straw Delayed oral transit;Premature spillage;Weak lingual manipulation Oral - Thin Teaspoon -- Oral - Thin Cup Delayed oral transit;Premature spillage;Weak lingual manipulation Oral - Thin Straw -- Oral - Puree Delayed oral transit;Premature spillage;Weak lingual manipulation Oral - Mech Soft -- Oral - Regular -- Oral - Multi-Consistency -- Oral - Pill -- Oral Phase - Comment --  [facility] IP PHARYNGEAL PHASE 09/26/2015 Pharyngeal Phase Impaired Pharyngeal- Pudding Teaspoon -- Pharyngeal -- Pharyngeal- Pudding Cup -- Pharyngeal -- Pharyngeal- Honey Teaspoon -- Pharyngeal -- Pharyngeal- Honey Cup -- Pharyngeal -- Pharyngeal- Nectar Teaspoon -- Pharyngeal -- Pharyngeal- Nectar Cup Delayed swallow initiation-vallecula;Reduced tongue base retraction;Pharyngeal residue - valleculae Pharyngeal -- Pharyngeal- Nectar Straw Delayed swallow initiation-vallecula;Reduced tongue base retraction;Pharyngeal residue - valleculae Pharyngeal -- Pharyngeal- Thin Teaspoon -- Pharyngeal -- Pharyngeal- Thin Cup Delayed swallow initiation-vallecula;Reduced tongue base retraction;Pharyngeal residue - valleculae;Penetration/Apiration after swallow Pharyngeal Material enters airway, remains ABOVE vocal cords and not ejected out Pharyngeal- Thin Straw -- Pharyngeal -- Pharyngeal- Puree Delayed swallow initiation-vallecula;Reduced tongue base retraction;Pharyngeal residue - valleculae Pharyngeal -- Pharyngeal- Mechanical Soft -- Pharyngeal -- Pharyngeal- Regular -- Pharyngeal -- Pharyngeal-  Multi-consistency -- Pharyngeal -- Pharyngeal- Pill -- Pharyngeal -- Pharyngeal Comment --  [facility] IP CERVICAL ESOPHAGEAL PHASE 09/26/2015 Cervical Esophageal Phase (No Data) Pudding Teaspoon -- Pudding Cup -- Honey Teaspoon -- Honey Cup -- Nectar Teaspoon -- Nectar Cup -- Nectar Straw -- Thin Teaspoon -- Thin Cup -- Thin Straw -- Puree -- Mechanical  Soft -- Regular -- Multi-consistency -- Pill -- Cervical Esophageal Comment -- [facility] IP GO 09/23/2015 Functional Assessment Tool Used [facility]OM[facility] and clinical judgment.   Functional Limitations Swallowing Swallow Current Status [phone]) CN Swallow Goal Status [id number]) CK Swallow Discharge Status [phone]) (None) Motor Speech Current Status [phone]) (None) Motor Speech Goal Status [phone]) (None) Motor Speech Goal Status [phone]) (None) Spoken Language Comprehension Current Status [phone]) (None) Spoken Language Comprehension Goal Status [id number]) (None) Spoken Language Comprehension Discharge Status [phone]) (None) Spoken Language Expression Current Status [phone]) (None) Spoken Language Expression Goal Status [phone]) (None) Spoken Language Expression Discharge Status [phone]) (None) Attention Current Status [id number]) (None) Attention Goal Status [id number]) (None) Attention Discharge Status [phone]) (None) Memory Current Status [id number]) (None) Memory Goal Status [id number]) (None) Memory Discharge Status [id number]) (None) Voice Current Status [id number]) (None) Voice Goal Status [id number]) (None) Voice Discharge Status [id number]) (None) Other Speech-Language Pathology Functional Limitation [phone]) (None) Other Speech-Language Pathology Functional Limitation Goal Status [id number]) (None) Other Speech-Language Pathology Functional Limitation Discharge Status [phone]) (None) Germain Osgood, M.A. CCC-SLP [phone] Germain Osgood 09/26/2015, 3:05 PM              Ir Cyndy Freeze Guide Cv Midline Picc Left  09/24/2015  CLINICAL DATA:  Altered mental status, renal failure, needs venous access for infusion therapy EXAM:  TUNNELED CENTRAL VENOUS CATHETER PLACEMENT WITH ULTRASOUND AND FLUOROSCOPIC GUIDANCE TECHNIQUE: The procedure, risks, benefits, and alternatives were explained to the family. Questions regarding the procedure were encouraged and answered. The family understands and consents to the procedure. An appropriate skin site was determined. Region was prepped using maximum barrier technique including cap and mask, sterile gown, sterile gloves, large sterile sheet, and Chlorhexidine as cutaneous antisepsis. The region was infiltrated locally with 1% lidocaine. Left IJ approach was selected because of an indwelling right jugular catheter. Under real-time ultrasound guidance, the left IJ vein was accessed with a 21 gauge micropuncture needle; the needle tip within the vein was confirmed with ultrasound image documentation. 3F dual-lumen cuffed powerPICC tunneled from a righ left t anterior chest wall approach to the dermatotomy site. Needle exchanged over the 018 guidewire for transitional dilator, through which the catheter which had been cut to 28 cm was advanced under intermittent fluoroscopy, positioned with its tip at the cavoatrial junction. Spot chest radiograph confirms good catheter position. No pneumothorax. Catheter was flushed per protocol. Catheter secured externally with O Prolene suture. The left IJ dermatotomy site was closed with Dermabond. COMPLICATIONS: COMPLICATIONS None immediate FLUOROSCOPY TIME:  30 seconds, 3 mGy COMPARISON:  None IMPRESSION: 1. Technically successful placement of tunneled left IJ tunneled dual-lumen power injectable catheter with ultrasound and fluoroscopic guidance. Ready for routine use. Electronically Signed   By: Lucrezia Europe M.D.   On: 09/24/2015 15:16    ASSESSMENT/PLAN:  Physical deconditioning - for rehabilitation  CAD S/P CABG X 4 - continue acetaminophen 325 mg 2 tabs = 650 mg by mouth every 6 hours when necessary and tramadol 50 mg 1 tab by mouth every 12 hours when  necessary for pain; aspirin 81 mg daily and metoprolol 25 mg 1 tab by mouth twice a day  Hypertension - continue amlodipine 10 mg 1 tab by mouth daily and Lasix 20 mg 1 tab by mouth every other day  Anemia, acute blood loss - hemoglobin 8.2; continue iron 65 mg 1 tab by mouth daily; check CBC   Diabetes mellitus with chronic kidney disease - continue Lantus 20 units subcutaneous twice a day and NovoLog sliding scale 3 times  a day and at bedtime  Hyperlipidemia - continue atorvastatin 40 mg 1 tab by mouth every 6 p.m.  Asthma - stable; continue Xopenex neb every 6 hours when necessary and albuterol 108 g/ACT inhaler 2 puffs into the lungs every 6 hours when necessary  Chronic kidney disease, stage III - creatinine 2.04; check BMP  Protein calorie malnutrition, severe - albumin 2.0; start Procel 2 scoops by mouth twice a day and RD consult     Goals of care:  Short-term rehabilitation    [facility], NP [place]

## 2015-10-05 ENCOUNTER — Encounter: Payer: Self-pay | Admitting: Internal Medicine

## 2015-10-05 ENCOUNTER — Non-Acute Institutional Stay: Payer: Medicare HMO | Admitting: Internal Medicine

## 2015-10-05 ENCOUNTER — Other Ambulatory Visit: Payer: Self-pay | Admitting: *Deleted

## 2015-10-05 DIAGNOSIS — D62 Acute posthemorrhagic anemia: Secondary | ICD-10-CM

## 2015-10-05 DIAGNOSIS — I25119 Atherosclerotic heart disease of native coronary artery with unspecified angina pectoris: Secondary | ICD-10-CM

## 2015-10-05 DIAGNOSIS — R0789 Other chest pain: Secondary | ICD-10-CM | POA: Diagnosis not present

## 2015-10-05 DIAGNOSIS — R5381 Other malaise: Secondary | ICD-10-CM

## 2015-10-05 DIAGNOSIS — I2510 Atherosclerotic heart disease of native coronary artery without angina pectoris: Secondary | ICD-10-CM

## 2015-10-05 DIAGNOSIS — I25709 Atherosclerosis of coronary artery bypass graft(s), unspecified, with unspecified angina pectoris: Secondary | ICD-10-CM | POA: Insufficient documentation

## 2015-10-05 DIAGNOSIS — J45909 Unspecified asthma, uncomplicated: Secondary | ICD-10-CM

## 2015-10-05 DIAGNOSIS — E46 Unspecified protein-calorie malnutrition: Secondary | ICD-10-CM

## 2015-10-05 DIAGNOSIS — Z452 Encounter for adjustment and management of vascular access device: Secondary | ICD-10-CM

## 2015-10-05 DIAGNOSIS — K219 Gastro-esophageal reflux disease without esophagitis: Secondary | ICD-10-CM

## 2015-10-05 DIAGNOSIS — N183 Chronic kidney disease, stage 3 unspecified: Secondary | ICD-10-CM

## 2015-10-05 DIAGNOSIS — I10 Essential (primary) hypertension: Secondary | ICD-10-CM | POA: Diagnosis not present

## 2015-10-05 DIAGNOSIS — E785 Hyperlipidemia, unspecified: Secondary | ICD-10-CM

## 2015-10-05 DIAGNOSIS — E1122 Type 2 diabetes mellitus with diabetic chronic kidney disease: Secondary | ICD-10-CM

## 2015-10-05 DIAGNOSIS — R0600 Dyspnea, unspecified: Secondary | ICD-10-CM | POA: Diagnosis not present

## 2015-10-05 DIAGNOSIS — Z794 Long term (current) use of insulin: Secondary | ICD-10-CM

## 2015-10-05 NOTE — Progress Notes (Signed)
LOCATION: [facility]  PCP: Benito Mccreedy, MD   Code Status: Full Code  Goals of care: Advanced Directive information Advanced Directives 09/14/2015  Does patient have an advance directive? Yes       Extended Emergency Contact Information Primary Emergency Contact: Arly, [place] Phone: [phone] Relation: Son Secondary Emergency Contact: Rehman,William Address: [address] [place], [place] [address] [place] Phone: [phone] Relation: Son   No Known Allergies  Chief Complaint  Patient presents with  . New Admit To SNF    New Admission     HPI:  Patient is a 82 y.o. female seen today for short term rehabilitation post hospital admission from 09/14/15-10/03/15 with chest pain. She underwent cardiac catheterization 09/17/15 showing severe 3 vessel disease. She underwent CABG X 4 on 09/19/15 utilizing LIMA to LAD, SVG to ramus intermediate, OM and distal RCA. She also underwent an endoscopic saphenous vein harvest from the left and right leg. She was transfused PRBC for postoperative blood loss anemia. sjhe had altered mental status and was seen by nephrology team. She also had dysphagia and was seen by SLP. She underwent MBS test. Her mental status improved and her diet was advanced as tolerated. She has PMH of hypertension, hyperlipidemia, diabetes mellitus, GERD, CKD 3 among others. She is seen in her room today with her daughter present.   Review of Systems:  Constitutional: Negative for fever, chills, diaphoresis. Energy is slowly coming back.  HENT: Negative for headache, congestion, nasal discharge, hearing loss, difficulty swallowing.   Eyes: Negative for blurred vision, double vision and discharge.  Respiratory: Negative for cough and wheezing. Positive for shortness of breath.  Cardiovascular: Negative for chest pain, palpitations, leg swelling.  Gastrointestinal: Negative for heartburn,  nausea, vomiting, abdominal pain. Poor appetite. Last bowel movement was yesterday.  Genitourinary: Negative for flank pain. Positive for some burning with urination.  Musculoskeletal: Negative for back pain, fall in the facility.  Skin: Negative for itching, rash.  Neurological: Negative for weakness and dizziness. Psychiatric/Behavioral: Negative for depression, anxiety, insomnia and memory loss.    Past Medical History  Diagnosis Date  . Peptic ulcer   . Mass   . Essential hypertension   . Hyperlipidemia   . Type 2 diabetes mellitus ([place])   . Asthma   . History of blood transfusion   . CKD (chronic kidney disease), stage III    Past Surgical History  Procedure Laterality Date  . Abdominal hysterectomy    . Colon surgery    . Cardiac catheterization N/A 09/17/2015    Procedure: Left Heart Cath and Coronary Angiography;  Surgeon: Jettie Booze, MD;  Location: [facility];  Service: Cardiovascular;  Laterality: N/A;  . Coronary artery bypass graft N/A 09/19/2015    Procedure: CORONARY ARTERY BYPASS GRAFTING (CABG) TIMES FOUR USING BILATARAL SAPHENOUS VEIN GRAFTS AND LEFT INTERNAL MAMMARY ARTERY;  Surgeon: [name], MD;  Location: [facility];  Service: Open Heart Surgery;  Laterality: N/A;  . Tee without cardioversion N/A 09/19/2015    Procedure: TRANSESOPHAGEAL ECHOCARDIOGRAM (TEE);  Surgeon: [name], MD;  Location: [facility];  Service: Open Heart Surgery;  Laterality: N/A;   Social History:   reports that she has never smoked. She has quit using smokeless tobacco. Her smokeless tobacco use included Snuff. She reports that she does not drink alcohol or use illicit drugs.  Family History  Problem Relation  Age of Onset  . Heart attack Father     Medications:   Medication List       This list is accurate as of: 10/05/15 11:31 AM.  Always use your most recent med list.               acetaminophen 325 MG tablet  Commonly known as:  TYLENOL  Take 2  tablets (650 mg total) by mouth every 6 (six) hours as needed for mild pain.     albuterol 108 (90 Base) MCG/ACT inhaler  Commonly known as:  PROVENTIL HFA;VENTOLIN HFA  Inhale 2 puffs into the lungs every 6 (six) hours as needed for wheezing or shortness of breath.     amLODipine 10 MG tablet  Commonly known as:  NORVASC  Take 10 mg by mouth daily.     aspirin 81 MG EC tablet  Take 1 tablet (81 mg total) by mouth daily.     atorvastatin 40 MG tablet  Commonly known as:  LIPITOR  Take 1 tablet (40 mg total) by mouth daily at 6 PM.     Calcium Carb-Cholecalciferol [phone] MG-UNIT Caps  Take 1 tablet by mouth daily.     calcium carbonate 500 MG chewable tablet  Commonly known as:  TUMS - dosed in mg elemental calcium  Chew 1 tablet by mouth daily as needed for indigestion or heartburn.     cholecalciferol 1000 units tablet  Commonly known as:  VITAMIN D  Take 1,000 Units by mouth daily.     diphenhydrAMINE 25 MG tablet  Commonly known as:  BENADRYL  Take 25 mg by mouth as needed for itching or allergies.     furosemide 20 MG tablet  Commonly known as:  LASIX  Take 20 mg by mouth every other day.     insulin glargine 100 UNIT/ML injection  Commonly known as:  LANTUS  Inject 20 Units into the skin 2 (two) times daily.     IRON CR PO  Take 65 mg by mouth daily.     levalbuterol 0.63 MG/3ML nebulizer solution  Commonly known as:  XOPENEX  Take 3 mLs (0.63 mg total) by nebulization every 6 (six) hours as needed for wheezing or shortness of breath.     meclizine 12.5 MG tablet  Commonly known as:  ANTIVERT  Take 12.5 mg by mouth 3 (three) times daily as needed for dizziness.     metoprolol tartrate 25 MG tablet  Commonly known as:  LOPRESSOR  Take 1 tablet (25 mg total) by mouth 2 (two) times daily.     NOVOLOG 100 UNIT/ML injection  Generic drug:  insulin aspart  Inject 0-24 Units into the skin. 0-24 units TID AC and HS as follows: 0-60 = 0 units, hypoglycemic  protocol and call MD; 61-119 = 0 units; 120-160 = 2 units; 161-200 =4 units, 201-250 = 8 units, 251-300 = 12 units, 301-350 = 16 units, 351-450 = 20 units, >450 = 24 units and call MD     PROCEL Powd  Take 2 scoop by mouth 2 (two) times daily.     traMADol 50 MG tablet  Commonly known as:  ULTRAM  Take 1 tablet (50 mg total) by mouth every 12 (twelve) hours as needed for moderate pain.        Immunizations:  There is no immunization history on file for this patient.   Physical Exam: Filed Vitals:   10/05/15 1120  BP: 132/61  Pulse: 84  Temp: 98.4  F (36.9 C)  TempSrc: Oral  Resp: 18  Height: 5\' 2"  (1.575 m)  Weight: 183 lb 3.2 oz (83.099 kg)  SpO2: 95%   Body mass index is 33.5 kg/(m^2).  General- elderly female, obese, in no acute distress Head- normocephalic, atraumatic Nose- no maxillary or frontal sinus tenderness, no nasal discharge Throat- moist mucus membrane, has upper and lower dentures Eyes- PERRLA, EOMI, no pallor, no icterus, no discharge, normal conjunctiva, normal sclera Neck- no cervical lymphadenopathy, surgical incision to left neck healing well Cardiovascular- normal s1,s2, no murmur, 1+ leg edema Respiratory- bilateral poor air entry to base, no wheeze, no rhonchi, no crackles, no use of accessory muscles Abdomen- bowel sounds present, soft, non tender Musculoskeletal- able to move all 4 extremities, generalized weakness Neurological- alert and oriented to person only Skin- warm and dry, surgical incision to the sternum healing well.  Graft site incision on both thigh areas healing well, has tunneled left chest IJ Psychiatry- normal mood and affect    Labs reviewed: Basic Metabolic Panel:  Recent Labs  09/19/15 2130 09/20/15 0400  09/20/15 1630  09/29/15 0425 09/30/15 0445 10/02/15 10/02/15 0430  NA  --  140  < >  --   < > 143 144 140 140  K  --  4.3  < >  --   < > 4.0 4.2  --  5.0  CL  --  107  < >  --   < > 102 102  --  100*  CO2  --   22  --   --   < > 31 32  --  32  GLUCOSE  --  118*  < >  --   < > 124* 70  --  121*  BUN  --  20  < >  --   < > 62* 59* 60* 60*  CREATININE 1.61* 1.98*  < > 2.63*  < > 2.02* 2.00* 2.0* 2.04*  CALCIUM  --  7.1*  --   --   < > 8.1* 8.2*  --  8.5*  MG 2.1 2.0  --  2.1  --   --   --   --   --   < > = values in this interval not displayed. Liver Function Tests:  Recent Labs  09/23/15 0421 09/24/15 0419 09/25/15 0417  AST 24 24 58*  ALT 30 29 44  ALKPHOS 63 50 68  BILITOT 0.4 0.6 0.4  PROT 5.3* 5.7* 5.7*  ALBUMIN 2.0* 2.1* 2.0*   No results for input(s): LIPASE, AMYLASE in the last 8760 hours.  Recent Labs  09/25/15 1000  AMMONIA 25   CBC:  Recent Labs  09/28/15 0503 09/29/15 0425 10/02/15 10/02/15 0430  WBC 13.0* 13.8* 13.8 13.8*  HGB 8.1* 8.4*  --  8.2*  HCT 26.0* 25.6*  --  27.1*  MCV 90.3 90.8  --  92.8  PLT 474* 489*  --  537*   Cardiac Enzymes:  Recent Labs  09/15/15 0343 09/15/15 0818 09/15/15 1454  TROPONINI 0.03 <0.03 <0.03   BNP: Invalid input(s): POCBNP CBG:  Recent Labs  10/02/15 2113 10/03/15 0619 10/03/15 1117  GLUCAP 155* 158* 177*    Radiological Exams: Dg Chest 2 View  10/01/2015  CLINICAL DATA:  Pleural effusions.  Weakness.  Shortness of breath. EXAM: CHEST  2 VIEW COMPARISON:  09/28/2015. FINDINGS: Left IJ line stable position. Mediastinum hilar structures are normal. Prior CABG. Stable cardiomegaly. Improving pulmonary venous congestion and interstitial prominence consistent  with improving congestive heart failure. Low lung volumes with basilar atelectasis. Small stable left pleural effusion. IMPRESSION: 1. Left IJ line stable position. 2. Prior CABG. Cardiomegaly with interim resolution of pulmonary vascular prominence and improving interstitial prominence suggesting improving congestive heart failure. 3. Low lung volumes with mild bibasilar atelectasis and/or residual infiltrates. Small unchanged left pleural effusion again noted.  Electronically Signed   By: Marcello Moores  Register   On: 10/01/2015 07:39   Dg Chest 2 View  09/14/2015  CLINICAL DATA:  Chest pain just prior to arrival. EXAM: CHEST  2 VIEW COMPARISON:  11/22/2012 FINDINGS: Stable mediastinal contours with borderline cardiomegaly. No pulmonary edema, confluent airspace disease, pleural effusion or pneumothorax. Exaggerated thoracic kyphosis and degenerative change throughout the thoracic spine, stable. IMPRESSION: Unchanged appearance of the chest.  No acute process. Electronically Signed   By: Jeb Levering M.D.   On: 09/14/2015 23:04   Ct Head Wo Contrast  09/24/2015  CLINICAL DATA:  Confusion, swallowing difficulty, status post CABG 5 days ago EXAM: CT HEAD WITHOUT CONTRAST TECHNIQUE: Contiguous axial images were obtained from the base of the skull through the vertex without intravenous contrast. COMPARISON:  None. FINDINGS: Motion degraded images. No evidence of parenchymal hemorrhage or extra-axial fluid collection. No mass lesion, mass effect, or midline shift. No CT evidence of acute infarction. Old left basal ganglia lacunar infarct. Subcortical white matter and periventricular small vessel ischemic changes. Intracranial atherosclerosis. Global cortical and central atrophy.  No ventriculomegaly. Partial opacification of the right ethmoid sinus. The mastoid air cells are unopacified. Right nasal tube. No evidence of calvarial fracture. IMPRESSION: No evidence of acute intracranial abnormality. Old left basal ganglia lacunar infarct. Atrophy with small vessel ischemic changes. Electronically Signed   By: Julian Hy M.D.   On: 09/24/2015 10:51   Ir US Guide Vasc Access Left  09/24/2015  CLINICAL DATA:  Altered mental status, renal failure, needs venous access for infusion therapy EXAM: TUNNELED CENTRAL VENOUS CATHETER PLACEMENT WITH ULTRASOUND AND FLUOROSCOPIC GUIDANCE TECHNIQUE: The procedure, risks, benefits, and alternatives were explained to the family.  Questions regarding the procedure were encouraged and answered. The family understands and consents to the procedure. An appropriate skin site was determined. Region was prepped using maximum barrier technique including cap and mask, sterile gown, sterile gloves, large sterile sheet, and Chlorhexidine as cutaneous antisepsis. The region was infiltrated locally with 1% lidocaine. Left IJ approach was selected because of an indwelling right jugular catheter. Under real-time ultrasound guidance, the left IJ vein was accessed with a 21 gauge micropuncture needle; the needle tip within the vein was confirmed with ultrasound image documentation. 37F dual-lumen cuffed powerPICC tunneled from a righ left t anterior chest wall approach to the dermatotomy site. Needle exchanged over the 018 guidewire for transitional dilator, through which the catheter which had been cut to 28 cm was advanced under intermittent fluoroscopy, positioned with its tip at the cavoatrial junction. Spot chest radiograph confirms good catheter position. No pneumothorax. Catheter was flushed per protocol. Catheter secured externally with O Prolene suture. The left IJ dermatotomy site was closed with Dermabond. COMPLICATIONS: COMPLICATIONS None immediate FLUOROSCOPY TIME:  30 seconds, 3 mGy COMPARISON:  None IMPRESSION: 1. Technically successful placement of tunneled left IJ tunneled dual-lumen power injectable catheter with ultrasound and fluoroscopic guidance. Ready for routine use. Electronically Signed   By: Lucrezia Europe M.D.   On: 09/24/2015 15:16   Dg Chest Port 1 View  09/28/2015  CLINICAL DATA:  Shortness of Breath EXAM: PORTABLE CHEST 1  VIEW COMPARISON:  09/26/2015 FINDINGS: Cardiac shadow remains enlarged. Postsurgical changes are again seen. A left jugular PICC line is again identified with the catheter tip in the proximal superior vena cava. It appears to have withdrawn slightly from the prior exam. Persistent left basilar infiltrate and  effusion are noted. Small right-sided pleural effusion remains. No other focal abnormality is noted. IMPRESSION: Stable bibasilar changes. Electronically Signed   By: Inez Catalina M.D.   On: 09/28/2015 07:49   Dg Chest Port 1 View  09/26/2015  CLINICAL DATA:  CHF, left pleural effusion. EXAM: PORTABLE CHEST 1 VIEW COMPARISON:  Portable chest x-ray of September 25, 2015 FINDINGS: The lungs are adequately inflated. There is persistent left lower lobe atelectasis or pneumonia. There are small bilateral pleural effusions greater on the left than on the right. The cardiac silhouette remains enlarged. The pulmonary vascularity remains engorged. The pulmonary interstitial markings are slightly less conspicuous today. The patient has undergone previous CABG. The left internal jugular venous catheter tip projects over the proximal SVC. IMPRESSION: Slight interval improvement in pulmonary interstitial edema. There remains left basilar atelectasis and mild CHF with small bilateral pleural effusions. Electronically Signed   By: David  Martinique M.D.   On: 09/26/2015 07:53   Dg Chest Port 1 View  09/25/2015  CLINICAL DATA:  Shortness of breath, history of asthma, Coronary artery disease, previous CABG, diabetes. EXAM: PORTABLE CHEST 1 VIEW COMPARISON:  Portable chest x-ray of September 24, 2015 FINDINGS: The lungs are borderline hypoinflated. The pulmonary interstitial markings remain increased. The left hemidiaphragm remains obscured. The cardiac silhouette remains enlarged and the pulmonary vascularity engorged. The feeding tube tip projects below the inferior margin of the image. The left internal jugular venous catheter tip projects over the proximal SVC. The bony thorax exhibits no acute abnormality. IMPRESSION: CHF with pulmonary interstitial edema with stable left basilar atelectasis and small effusion. No significant change since yesterday's study. Electronically Signed   By: David  Martinique M.D.   On: 09/25/2015 08:01   Dg  Chest Port 1 View  09/24/2015  CLINICAL DATA:  Pulmonary atelectasis.  Shortness of breath today. EXAM: PORTABLE CHEST 1 VIEW COMPARISON:  09/23/2015 FINDINGS: Right internal jugular venous access sheath remains in place. There has been previous median sternotomy and CABG. There continues to be left lower lobe collapse with a small amount of pleural fluid on the left. Mild volume loss at the right base. Possible pulmonary venous hypertension. IMPRESSION: No change.  Persistent left lower lobe collapse and left effusion. Electronically Signed   By: Nelson Chimes M.D.   On: 09/24/2015 07:38   Dg Chest Port 1 View  09/23/2015  CLINICAL DATA:  Atelectasis EXAM: PORTABLE CHEST 1 VIEW COMPARISON:  Chest radiograph from one day prior. FINDINGS: Right internal jugular central venous sheath terminates in the right brachiocephalic vein. Sternotomy wires appear aligned and intact. Stable cardiomediastinal silhouette with mild-to-moderate cardiomegaly. No pneumothorax. Stable small left pleural effusion. Mild-to-moderate pulmonary edema is not appreciably changed. Left greater than right lung base opacities, slightly decreased on the right and stable on the left. IMPRESSION: 1. Stable mild-to-moderate congestive heart failure. 2. Stable small left pleural effusion. 3. Bibasilar lung opacities, left greater than right, slightly improved on the right and stable on the left, favor atelectasis. Electronically Signed   By: Ilona Sorrel M.D.   On: 09/23/2015 08:09   Dg Chest Port 1 View  09/22/2015  CLINICAL DATA:  Chest tubes removed per RN. EXAM: PORTABLE CHEST 1 VIEW COMPARISON:  Chest radiograph from one day prior. FINDINGS: Sternotomy wires appear aligned and intact. Right internal jugular central venous sheath terminates in the upper third of the superior vena cava. Slightly low lung volumes. Stable cardiomediastinal silhouette with mild cardiomegaly. No pneumothorax. Stable small left pleural effusion. Mild-to-moderate  pulmonary edema appears slightly worsened. Bilateral lower lobe opacities appear unchanged, favor atelectasis. IMPRESSION: 1. Mild-to-moderate congestive heart failure, slightly worsened. 2. Stable small left pleural effusion. 3. Stable bilateral lower lobe opacities, favor atelectasis. Electronically Signed   By: Ilona Sorrel M.D.   On: 09/22/2015 10:08   Dg Chest Port 1 View  09/21/2015  CLINICAL DATA:  Hypertension.  Diabetes.  Post CABG. EXAM: PORTABLE CHEST 1 VIEW COMPARISON:  09/20/2015; 09/19/2015; 09/14/2015 FINDINGS: Grossly unchanged enlarged cardiac silhouette and mediastinal contours given patient rotation. Interval removal of right jugular approach PA catheter with remaining vascular sheath tip overlying the mid SVC. No pneumothorax. Mild pulmonary venous congestion without frank evidence of edema. There is a minimal amount of pleural parenchymal thickening about the right minor fissure. Trace bilateral effusions are not excluded. Bibasilar heterogeneous/consolidative opacities are unchanged. No new focal airspace opacities. Unchanged bones. IMPRESSION: 1. Remaining support apparatus as above.  No pneumothorax. 2. Pulmonary is congestion without frank evidence of edema. 3. Unchanged bibasilar opacities, left greater than right, likely atelectasis. Electronically Signed   By: Sandi Mariscal M.D.   On: 09/21/2015 07:47   Dg Chest Port 1 View  09/20/2015  CLINICAL DATA:  Post CABG, hypertension, type II diabetes mellitus, asthma EXAM: PORTABLE CHEST 1 VIEW COMPARISON:  Portable exam 0628 hours compared to 09/19/2015 FINDINGS: Swan-Ganz catheter tip projects over RIGHT pulmonary artery. LEFT thoracostomy tube stable. Enlargement of cardiac silhouette post CABG. Atherosclerotic calcification aorta. Bibasilar atelectasis. Significant portions of the upper lobes and lung apices obscured by patient's head. No gross pleural effusion. Unable to assess for pneumothorax due to obscuration of the upper lobes.  IMPRESSION: Bibasilar atelectasis. Enlargement of cardiac silhouette post CABG. Electronically Signed   By: Lavonia Dana M.D.   On: 09/20/2015 08:06   Dg Chest Port 1 View  09/19/2015  CLINICAL DATA:  Coronary artery disease.  Status post CABG. EXAM: PORTABLE CHEST 1 VIEW COMPARISON:  09/14/2015 FINDINGS: Endotracheal tube and NG tube and chest tubes appear in good position. Swan-Ganz catheter tip is in the right main pulmonary artery. The lungs are clear. Very tiny left apical pneumothorax. Heart size and vascularity are normal. Aortic atherosclerosis. IMPRESSION: Very tiny left apical pneumothorax. Tubes and lines appear in good position. Electronically Signed   By: Lorriane Shire M.D.   On: 09/19/2015 16:39   Dg Abd Portable 1v  09/24/2015  CLINICAL DATA:  Feeding tube placement EXAM: PORTABLE ABDOMEN - 1 VIEW COMPARISON:  Nine/ 19/15 FINDINGS: There is normal small bowel gas pattern. The patient is status post CABG. There is and she feeding tube with tip in and distal stomach. IMPRESSION: NG feeding tube with tip in distal stomach. Electronically Signed   By: Lahoma Crocker M.D.   On: 09/24/2015 11:12   Dg Swallowing Func-speech Pathology  09/26/2015  Objective Swallowing Evaluation: Type of Study: MBS-Modified Barium Swallow Study Patient Details Name: Venesha Accurso MRN: [id number] Date of Birth: [birth year] Today's Date: 09/26/2015 Time: SLP Start Time (ACUTE ONLY): 1405-SLP Stop Time (ACUTE ONLY): 1422 SLP Time Calculation (min) (ACUTE ONLY): 17 min Past Medical History: Past Medical History Diagnosis Date . Peptic ulcer  . Mass  . Essential hypertension  . Hyperlipidemia  . Type 2  diabetes mellitus ([place])  . Asthma  . History of blood transfusion  . CKD (chronic kidney disease), stage III  Past Surgical History: Past Surgical History Procedure Laterality Date . Abdominal hysterectomy   . Colon surgery   . Cardiac catheterization N/A 09/17/2015   Procedure: Left Heart Cath and Coronary Angiography;  Surgeon:  Jettie Booze, MD;  Location: [facility];  Service: Cardiovascular;  Laterality: N/A; . Coronary artery bypass graft N/A 09/19/2015   Procedure: CORONARY ARTERY BYPASS GRAFTING (CABG) TIMES FOUR USING BILATARAL SAPHENOUS VEIN GRAFTS AND LEFT INTERNAL MAMMARY ARTERY;  Surgeon: [name], MD;  Location: [facility];  Service: Open Heart Surgery;  Laterality: N/A; . Tee without cardioversion N/A 09/19/2015   Procedure: TRANSESOPHAGEAL ECHOCARDIOGRAM (TEE);  Surgeon: [name], MD;  Location: [facility];  Service: Open Heart Surgery;  Laterality: N/A; HPI: Sheina Yerian is a 82 y.o. female with PMH of hypertension, hyperlipidemia, diabetes mellitus, asthma, GERD, gout, chronic kidney disease-stage III, peptic ulcer disease, who presents with chest pain.  The patient is s/p CABG x 4 and per nursing has been coughing with intake.  Per nursing the patient was relatively independent prior to admission with not reported swallowing issues.  Current chest xray is showing stable mild-to-moderate congestive heart failure, stable small left pleural effusion and bibasilar lung opacities, left greater than right, slightly Subjective: pt alert, daughter says she is clearing her throat throughout the day since surgery Assessment / Plan / Recommendation CHL IP CLINICAL IMPRESSIONS 09/26/2015 Therapy Diagnosis Moderate oral phase dysphagia;Mild pharyngeal phase dysphagia;Mild oral phase dysphagia Clinical Impression Pt has a mild-moderate oropharyngeal dysphagia, although with overall visibility reduced due to shoulders/body habitus limiting view. She does have slow oral transit with premature spillage to the valleculae. Mild residue remained in the valleculae post-swallow with trace, shallow penetration observed x1 after the swallow with thin liquids. Pt cleared with cued throat clear. Although it was difficult to visualize the upper esophagus, there did appear to be backflow to the level of the UES x1. Throat clearing  occurred throughout the duration of testing but was not correlated with airway compromise. Recommend to continue with current textures. Impact on safety and function Mild aspiration risk;Moderate aspiration risk   CHL IP TREATMENT RECOMMENDATION 09/26/2015 Treatment Recommendations Therapy as outlined in treatment plan below   Prognosis 09/26/2015 Prognosis for Safe Diet Advancement Good Barriers to Reach Goals Cognitive deficits Barriers/Prognosis Comment -- CHL IP DIET RECOMMENDATION 09/26/2015 SLP Diet Recommendations Dysphagia 1 (Puree) solids;Nectar thick liquid Liquid Administration via Cup;Straw Medication Administration Crushed with puree Compensations Minimize environmental distractions;Slow rate;Small sips/bites Postural Changes Remain semi-upright after after feeds/meals (Comment);Seated upright at 90 degrees   CHL IP OTHER RECOMMENDATIONS 09/26/2015 Recommended Consults -- Oral Care Recommendations Oral care BID Other Recommendations Order thickener from pharmacy;Prohibited food (jello, ice cream, thin soups);Remove water pitcher   [facility] IP FOLLOW UP RECOMMENDATIONS 09/26/2015 Follow up Recommendations Skilled Nursing facility   [facility] IP FREQUENCY AND DURATION 09/26/2015 Speech Therapy Frequency (ACUTE ONLY) min 2x/week Treatment Duration 2 weeks      [facility] IP ORAL PHASE 09/26/2015 Oral Phase Impaired Oral - Pudding Teaspoon -- Oral - Pudding Cup -- Oral - Honey Teaspoon -- Oral - Honey Cup -- Oral - Nectar Teaspoon -- Oral - Nectar Cup Delayed oral transit;Premature spillage;Weak lingual manipulation Oral - Nectar Straw Delayed oral transit;Premature spillage;Weak lingual manipulation Oral - Thin Teaspoon -- Oral - Thin Cup Delayed oral transit;Premature spillage;Weak lingual manipulation Oral - Thin Straw -- Oral -  Puree Delayed oral transit;Premature spillage;Weak lingual manipulation Oral - Mech Soft -- Oral - Regular -- Oral - Multi-Consistency -- Oral - Pill -- Oral Phase - Comment --  [facility] IP PHARYNGEAL PHASE  09/26/2015 Pharyngeal Phase Impaired Pharyngeal- Pudding Teaspoon -- Pharyngeal -- Pharyngeal- Pudding Cup -- Pharyngeal -- Pharyngeal- Honey Teaspoon -- Pharyngeal -- Pharyngeal- Honey Cup -- Pharyngeal -- Pharyngeal- Nectar Teaspoon -- Pharyngeal -- Pharyngeal- Nectar Cup Delayed swallow initiation-vallecula;Reduced tongue base retraction;Pharyngeal residue - valleculae Pharyngeal -- Pharyngeal- Nectar Straw Delayed swallow initiation-vallecula;Reduced tongue base retraction;Pharyngeal residue - valleculae Pharyngeal -- Pharyngeal- Thin Teaspoon -- Pharyngeal -- Pharyngeal- Thin Cup Delayed swallow initiation-vallecula;Reduced tongue base retraction;Pharyngeal residue - valleculae;Penetration/Apiration after swallow Pharyngeal Material enters airway, remains ABOVE vocal cords and not ejected out Pharyngeal- Thin Straw -- Pharyngeal -- Pharyngeal- Puree Delayed swallow initiation-vallecula;Reduced tongue base retraction;Pharyngeal residue - valleculae Pharyngeal -- Pharyngeal- Mechanical Soft -- Pharyngeal -- Pharyngeal- Regular -- Pharyngeal -- Pharyngeal- Multi-consistency -- Pharyngeal -- Pharyngeal- Pill -- Pharyngeal -- Pharyngeal Comment --  [facility] IP CERVICAL ESOPHAGEAL PHASE 09/26/2015 Cervical Esophageal Phase (No Data) Pudding Teaspoon -- Pudding Cup -- Honey Teaspoon -- Honey Cup -- Nectar Teaspoon -- Nectar Cup -- Nectar Straw -- Thin Teaspoon -- Thin Cup -- Thin Straw -- Puree -- Mechanical Soft -- Regular -- Multi-consistency -- Pill -- Cervical Esophageal Comment -- [facility] IP GO 09/23/2015 Functional Assessment Tool Used ASHA NOMS and clinical judgment.   Functional Limitations Swallowing Swallow Current Status [phone]) CN Swallow Goal Status [id number]) CK Swallow Discharge Status [phone]) (None) Motor Speech Current Status [phone]) (None) Motor Speech Goal Status [phone]) (None) Motor Speech Goal Status [phone]) (None) Spoken Language Comprehension Current Status [phone]) (None) Spoken Language Comprehension Goal  Status [id number]) (None) Spoken Language Comprehension Discharge Status [phone]) (None) Spoken Language Expression Current Status [phone]) (None) Spoken Language Expression Goal Status [phone]) (None) Spoken Language Expression Discharge Status [phone]) (None) Attention Current Status [id number]) (None) Attention Goal Status [id number]) (None) Attention Discharge Status [phone]) (None) Memory Current Status [id number]) (None) Memory Goal Status [id number]) (None) Memory Discharge Status [id number]) (None) Voice Current Status [id number]) (None) Voice Goal Status [id number]) (None) Voice Discharge Status [id number]) (None) Other Speech-Language Pathology Functional Limitation [phone]) (None) Other Speech-Language Pathology Functional Limitation Goal Status [id number]) (None) Other Speech-Language Pathology Functional Limitation Discharge Status [phone]) (None) Germain Osgood, M.A. CCC-SLP [phone] Germain Osgood 09/26/2015, 3:05 PM              Ir Cyndy Freeze Guide Cv Midline Picc Left  09/24/2015  CLINICAL DATA:  Altered mental status, renal failure, needs venous access for infusion therapy EXAM: TUNNELED CENTRAL VENOUS CATHETER PLACEMENT WITH ULTRASOUND AND FLUOROSCOPIC GUIDANCE TECHNIQUE: The procedure, risks, benefits, and alternatives were explained to the family. Questions regarding the procedure were encouraged and answered. The family understands and consents to the procedure. An appropriate skin site was determined. Region was prepped using maximum barrier technique including cap and mask, sterile gown, sterile gloves, large sterile sheet, and Chlorhexidine as cutaneous antisepsis. The region was infiltrated locally with 1% lidocaine. Left IJ approach was selected because of an indwelling right jugular catheter. Under real-time ultrasound guidance, the left IJ vein was accessed with a 21 gauge micropuncture needle; the needle tip within the vein was confirmed with ultrasound image documentation. 81F dual-lumen cuffed powerPICC tunneled from a righ  left t anterior chest wall approach to the dermatotomy site. Needle exchanged over the 018 guidewire for transitional dilator, through which the catheter which had been cut to 28 cm was advanced under intermittent fluoroscopy,  positioned with its tip at the cavoatrial junction. Spot chest radiograph confirms good catheter position. No pneumothorax. Catheter was flushed per protocol. Catheter secured externally with O Prolene suture. The left IJ dermatotomy site was closed with Dermabond. COMPLICATIONS: COMPLICATIONS None immediate FLUOROSCOPY TIME:  30 seconds, 3 mGy COMPARISON:  None IMPRESSION: 1. Technically successful placement of tunneled left IJ tunneled dual-lumen power injectable catheter with ultrasound and fluoroscopic guidance. Ready for routine use. Electronically Signed   By: Lucrezia Europe M.D.   On: 09/24/2015 15:16    Assessment/Plan  Physical deconditioning Will have her work with physical therapy and occupational therapy team to help with gait training and muscle strengthening exercises.fall precautions. Skin care. Encourage to be out of bed.   CAD  S/P catheterization and CABG X 4.  Continue aspirin 81 mg daily, atorvastatin and metoprolol 25 mg bid. Monitor BP and HR. Has f/u with cardiology and cardiothoracic surgery. Continue incisional care. Will have patient work with PT/OT as tolerated to regain strength and restore function.  Fall precautions are in place.  Chest wall soreness Post surgery. continue tylenol 650 mg q6h prn and tramadol 50 mg bid prn for pain  Dyspnea Both at rest and with exertion. Start o2 2 l/min for symptomatic releief and wean it off as tolertaed  Left IJ Has tunneled left IJ placed by IR. Currently not being used. Will need further clarification from cardiothoracic surgery on IR guided removal vs keeping it until further follow up visit.   Blood loss anemia S/p 1 u prbc transfusion. Continue iron supplement. Check cbc  HTN Stable bp, monitor bp  readings. continue amlodipine 10 mg daily, lopressor 25 mg bid and Lasix 20 mg qod. Check bmp  Diabetes mellitus with renal disease Lab Results  Component Value Date   HGBA1C 8.2* 09/15/2015   Monitor cbg. continue Lantus 20 units bid and SSI novolog.    CKD 3 Monitor bmp  Protein calorie malnutrition Monitor po intake and to be seen by dietary team  Dysphagia Denies difficulty swallowing. Seen by SLP in hospital. Will have SLP team screen and evaluate patient   Hyperlipidemia continue atorvastatin 40 mg daily  Asthma continue Xopenex neb every 6 hours when necessary and monitor    Goals of care: short term rehabilitation   Labs/tests ordered: cbc, cmp  Family/ staff Communication: reviewed care plan with patient and nursing supervisor    Blanchie Serve, MD Internal Medicine [place], [place] [address] Cell Phone (Monday-Friday 8 am - 5 pm): [phone] On Call: [phone] and follow prompts after 5 pm and on weekends Office Phone: [phone] Office Fax: [phone]

## 2015-10-08 ENCOUNTER — Encounter (HOSPITAL_COMMUNITY): Payer: Self-pay | Admitting: Anesthesiology

## 2015-10-08 ENCOUNTER — Encounter: Payer: Self-pay | Admitting: Adult Health

## 2015-10-08 ENCOUNTER — Non-Acute Institutional Stay: Payer: Medicare HMO | Admitting: Adult Health

## 2015-10-08 ENCOUNTER — Ambulatory Visit (HOSPITAL_COMMUNITY)
Admission: RE | Admit: 2015-10-08 | Discharge: 2015-10-08 | Disposition: A | Discharge status: Home / Self Care | Payer: Medicare HMO | Source: Ambulatory Visit | Attending: Cardiothoracic Surgery | Admitting: Cardiothoracic Surgery

## 2015-10-08 DIAGNOSIS — B372 Candidiasis of skin and nail: Secondary | ICD-10-CM

## 2015-10-08 DIAGNOSIS — Z452 Encounter for adjustment and management of vascular access device: Secondary | ICD-10-CM | POA: Diagnosis not present

## 2015-10-08 DIAGNOSIS — I2510 Atherosclerotic heart disease of native coronary artery without angina pectoris: Secondary | ICD-10-CM

## 2015-10-08 NOTE — Progress Notes (Signed)
Patient ID: Monica [name], female   DOB: [birth year], 82 y.o.   MRN: [id number]    DATE:  10/08/15  MRN:  [id number]  BIRTHDAY: [birth year]  Facility:  Nursing Home Location:  [place] Room Number: 908-P  LEVEL OF CARE:  SNF [phone])  Contact Information    Name Relation Home Work [place] Son [phone]     Jameson, Gharib [phone]     [name] Daughter [phone]         Code Status History    Date Active Date Inactive Code Status Order ID Comments User Context   09/15/2015  3:24 AM 09/19/2015  3:48 PM Full Code [id number]  Ivor Costa, MD Inpatient       Chief Complaint  Patient presents with  . Acute Visit    Bilateral breast Candida    HISTORY OF PRESENT ILLNESS:   This is an 82 year old female who has been noted to have erythematous rashes under bilateral breast. She is obese.   She has been admitted to [facility] on 10/03/15 from [facility]. She has PMH of hypertension, hyperlipidemia, diabetes mellitus, asthma, GERD, gout, chronic kidney disease-stage III and peptic ulcer disease.  She was having chest pains and had cardiac catheterization on 09/17/15. This revealed severe 3 vessel CAD. She then underwent CABG 4 utilizing LIMA to LAD, SVG to ramus intermediate SVG to OM and SVG to distal RCA. She also underwent an endoscopic saphenous vein harvest from the left and right leg. She was transfused 1 unit of packed RBC for expected postoperative blood loss anemia. Speech swallow evaluation was obtained and showed swallowing dysfunction. She initially was kept nothing by mouth and required placement of a panda feeding tube. She developed some acute mental status changes. Modified barium swallow was completed and it was recommended for the patient to be placed on a modified diet. It was felt her changes were likely due to acute embolic encephalopathy. She continued to make progress. Her mental status improved and her  swallowing dysfunction and proved.  She has been admitted for a short-term rehabilitation.   PAST MEDICAL HISTORY:  Past Medical History  Diagnosis Date  . Peptic ulcer   . Mass   . Essential hypertension   . Hyperlipidemia   . Type 2 diabetes mellitus ([place])   . Asthma   . History of blood transfusion   . CKD (chronic kidney disease), stage III   . Physical deconditioning   . Coronary artery disease involving native coronary artery of native heart without angina pectoris   . GERD (gastroesophageal reflux disease)   . Acute blood loss anemia   . Protein calorie malnutrition ([place])   . Chest wall pain   . Asthma   . Dyspnea   . Encounter for central line care   . Candida infection 10/2015    Bilateral breasts     CURRENT MEDICATIONS: Reviewed  Patient's Medications  New Prescriptions   No medications on file  Previous Medications   ACETAMINOPHEN (TYLENOL) 325 MG TABLET    Take 2 tablets (650 mg total) by mouth every 6 (six) hours as needed for mild pain.   ALBUTEROL (PROVENTIL HFA;VENTOLIN HFA) 108 (90 BASE) MCG/ACT INHALER    Inhale 2 puffs into the lungs every 6 (six) hours as needed for wheezing or shortness of breath.   AMLODIPINE (NORVASC) 10 MG TABLET    Take 10 mg by mouth daily.   ASPIRIN EC 81 MG EC  TABLET    Take 1 tablet (81 mg total) by mouth daily.   ATORVASTATIN (LIPITOR) 40 MG TABLET    Take 1 tablet (40 mg total) by mouth daily at 6 PM.   CALCIUM CARB-CHOLECALCIFEROL [phone] MG-UNIT CAPS    Take 1 tablet by mouth daily.   CALCIUM CARBONATE (TUMS - DOSED IN MG ELEMENTAL CALCIUM) 500 MG CHEWABLE TABLET    Chew 1 tablet by mouth daily as needed for indigestion or heartburn.   CHOLECALCIFEROL (VITAMIN D-3) 1000 UNITS CAPS    Take 1 capsule by mouth daily.   DIPHENHYDRAMINE (BENADRYL) 25 MG TABLET    Take 25 mg by mouth as needed for itching or allergies.    FERROUS SULFATE 325 (65 FE) MG TABLET    Take 325 mg by mouth daily with breakfast.   FUROSEMIDE (LASIX) 20  MG TABLET    Take 20 mg by mouth every other day.    INSULIN ASPART (NOVOLOG) 100 UNIT/ML INJECTION    Inject 0-24 Units into the skin. 0-24 units TID AC and HS as follows: 0-60 = 0 units, hypoglycemic protocol and call MD; 61-119 = 0 units; 120-160 = 2 units; 161-200 =4 units, 201-250 = 8 units, 251-300 = 12 units, 301-350 = 16 units, 351-450 = 20 units, >450 = 24 units and call MD   INSULIN GLARGINE (LANTUS) 100 UNIT/ML INJECTION    Inject 20 Units into the skin 2 (two) times daily.    LEVALBUTEROL (XOPENEX) 0.63 MG/3ML NEBULIZER SOLUTION    Take 3 mLs (0.63 mg total) by nebulization every 6 (six) hours as needed for wheezing or shortness of breath.   MECLIZINE (ANTIVERT) 12.5 MG TABLET    Take 12.5 mg by mouth 3 (three) times daily as needed for dizziness.   MELATONIN 3 MG TABS    Take 1 tablet by mouth at bedtime.   METOPROLOL TARTRATE (LOPRESSOR) 25 MG TABLET    Take 1 tablet (25 mg total) by mouth 2 (two) times daily.   NYSTATIN (MYCOSTATIN) POWDER    Apply topically 2 (two) times daily. 100,000 units/gm powder applied under bilateral breasts BID x3 weeks   PROTEIN (PROCEL) POWD    Take 2 scoop by mouth 2 (two) times daily.           TRAMADOL (ULTRAM) 50 MG TABLET    Take 1 tablet (50 mg total) by mouth every 12 (twelve) hours as needed for moderate pain.  Modified Medications   No medications on file  Discontinued Medications   CHOLECALCIFEROL (VITAMIN D) 1000 UNITS TABLET    Take 1,000 Units by mouth daily.   IRON CR PO    Take 65 mg by mouth daily.    No Known Allergies   REVIEW OF SYSTEMS:  GENERAL: no change in appetite, no fatigue, no weight changes, no fever, chills or weakness EYES: Denies change in vision, dry eyes, eye pain, itching or discharge EARS: Denies change in hearing, ringing in ears, or earache NOSE: Denies nasal congestion or epistaxis MOUTH and THROAT: Denies oral discomfort, gingival pain or bleeding, pain from teeth or hoarseness   RESPIRATORY: no cough,  SOB, DOE, wheezing, hemoptysis CARDIAC: no chest pain, edema or palpitations GI: no abdominal pain, diarrhea, constipation, heart burn, nausea or vomiting GU: Denies dysuria, frequency, hematuria, incontinence, or discharge PSYCHIATRIC: Denies feeling of depression or anxiety. No report of hallucinations, insomnia, paranoia, or agitation   PHYSICAL EXAMINATION  GENERAL APPEARANCE: Well nourished. In no acute distress. Obese SKIN:  Chest  midline incision is dry, erythematous and moist rashes under bilateral breast HEAD: Normal in size and contour. No evidence of trauma EYES: Lids open and close normally. No blepharitis, entropion or ectropion. PERRL. Conjunctivae are clear and sclerae are white. Lenses are without opacity EARS: Pinnae are normal. Patient hears normal voice tunes of the examiner MOUTH and THROAT: Lips are without lesions. Oral mucosa is moist and without lesions. Tongue is normal in shape, size, and color and without lesions NECK: supple, trachea midline, no neck masses, no thyroid tenderness, no thyromegaly LYMPHATICS: no LAN in the neck, no supraclavicular LAN RESPIRATORY: breathing is even & unlabored, BS CTAB CARDIAC: RRR, no murmur,no extra heart sounds, no edema GI: abdomen soft, normal BS, no masses, no tenderness, no hepatomegaly, no splenomegaly EXTREMITIES:  Able to move X 4 extremities PSYCHIATRIC: Alert and oriented X 3. Affect and behavior are appropriate  LABS/RADIOLOGY: Labs reviewed: Basic Metabolic Panel:  Recent Labs  09/19/15 2130 09/20/15 0400  09/20/15 1630  09/29/15 0425 09/30/15 0445 10/02/15 10/02/15 0430  NA  --  140  < >  --   < > 143 144 140 140  K  --  4.3  < >  --   < > 4.0 4.2  --  5.0  CL  --  107  < >  --   < > 102 102  --  100*  CO2  --  22  --   --   < > 31 32  --  32  GLUCOSE  --  118*  < >  --   < > 124* 70  --  121*  BUN  --  20  < >  --   < > 62* 59* 60* 60*  CREATININE 1.61* 1.98*  < > 2.63*  < > 2.02* 2.00* 2.0* 2.04*   CALCIUM  --  7.1*  --   --   < > 8.1* 8.2*  --  8.5*  MG 2.1 2.0  --  2.1  --   --   --   --   --   < > = values in this interval not displayed. Liver Function Tests:  Recent Labs  09/23/15 0421 09/24/15 0419 09/25/15 0417  AST 24 24 58*  ALT 30 29 44  ALKPHOS 63 50 68  BILITOT 0.4 0.6 0.4  PROT 5.3* 5.7* 5.7*  ALBUMIN 2.0* 2.1* 2.0*    CBC:  Recent Labs  09/28/15 0503 09/29/15 0425 10/02/15 10/02/15 0430  WBC 13.0* 13.8* 13.8 13.8*  HGB 8.1* 8.4*  --  8.2*  HCT 26.0* 25.6*  --  27.1*  MCV 90.3 90.8  --  92.8  PLT 474* 489*  --  537*   Lipid Panel:  Recent Labs  09/15/15 0343  HDL 35*   Cardiac Enzymes:  Recent Labs  09/15/15 0343 09/15/15 0818 09/15/15 1454  TROPONINI 0.03 <0.03 <0.03    CBG:  Recent Labs  10/02/15 2113 10/03/15 0619 10/03/15 1117  GLUCAP 155* 158* 177*      Dg Chest 2 View  10/01/2015  CLINICAL DATA:  Pleural effusions.  Weakness.  Shortness of breath. EXAM: CHEST  2 VIEW COMPARISON:  09/28/2015. FINDINGS: Left IJ line stable position. Mediastinum hilar structures are normal. Prior CABG. Stable cardiomegaly. Improving pulmonary venous congestion and interstitial prominence consistent with improving congestive heart failure. Low lung volumes with basilar atelectasis. Small stable left pleural effusion. IMPRESSION: 1. Left IJ line stable position. 2. Prior CABG. Cardiomegaly with interim resolution  of pulmonary vascular prominence and improving interstitial prominence suggesting improving congestive heart failure. 3. Low lung volumes with mild bibasilar atelectasis and/or residual infiltrates. Small unchanged left pleural effusion again noted. Electronically Signed   By: Marcello Moores  Register   On: 10/01/2015 07:39   Dg Chest 2 View  09/14/2015  CLINICAL DATA:  Chest pain just prior to arrival. EXAM: CHEST  2 VIEW COMPARISON:  11/22/2012 FINDINGS: Stable mediastinal contours with borderline cardiomegaly. No pulmonary edema, confluent  airspace disease, pleural effusion or pneumothorax. Exaggerated thoracic kyphosis and degenerative change throughout the thoracic spine, stable. IMPRESSION: Unchanged appearance of the chest.  No acute process. Electronically Signed   By: Jeb Levering M.D.   On: 09/14/2015 23:04   Ct Head Wo Contrast  09/24/2015  CLINICAL DATA:  Confusion, swallowing difficulty, status post CABG 5 days ago EXAM: CT HEAD WITHOUT CONTRAST TECHNIQUE: Contiguous axial images were obtained from the base of the skull through the vertex without intravenous contrast. COMPARISON:  None. FINDINGS: Motion degraded images. No evidence of parenchymal hemorrhage or extra-axial fluid collection. No mass lesion, mass effect, or midline shift. No CT evidence of acute infarction. Old left basal ganglia lacunar infarct. Subcortical white matter and periventricular small vessel ischemic changes. Intracranial atherosclerosis. Global cortical and central atrophy.  No ventriculomegaly. Partial opacification of the right ethmoid sinus. The mastoid air cells are unopacified. Right nasal tube. No evidence of calvarial fracture. IMPRESSION: No evidence of acute intracranial abnormality. Old left basal ganglia lacunar infarct. Atrophy with small vessel ischemic changes. Electronically Signed   By: Julian Hy M.D.   On: 09/24/2015 10:51   Ir Removal Tun Cv Cath W/o Fl  10/08/2015  CLINICAL DATA:  Tunneled IJ PICC placed 10/02/2015 by me for infusion therapy, worked well without complication, no longer required. EXAM: TUNNELED CENTRAL VENOUS CATHETER REMOVAL TECHNIQUE: Overlying skin prepped with chlorhexidine, draped in usual sterile fashion, infiltrated locally with 1% lidocaine. The previously placed left IJ tunneled central venous catheter was dissected free from the underlying soft tissues and removed intact. Hemostasis was achieved. Site covered with a sterile dressing. The patient tolerated the procedure well. COMPLICATIONS: none  IMPRESSION: 1. Technically successful tunneled left IJ central venous catheter removal. Electronically Signed   By: Lucrezia Europe M.D.   On: 10/08/2015 08:45   Ir US Guide Vasc Access Left  09/24/2015  CLINICAL DATA:  Altered mental status, renal failure, needs venous access for infusion therapy EXAM: TUNNELED CENTRAL VENOUS CATHETER PLACEMENT WITH ULTRASOUND AND FLUOROSCOPIC GUIDANCE TECHNIQUE: The procedure, risks, benefits, and alternatives were explained to the family. Questions regarding the procedure were encouraged and answered. The family understands and consents to the procedure. An appropriate skin site was determined. Region was prepped using maximum barrier technique including cap and mask, sterile gown, sterile gloves, large sterile sheet, and Chlorhexidine as cutaneous antisepsis. The region was infiltrated locally with 1% lidocaine. Left IJ approach was selected because of an indwelling right jugular catheter. Under real-time ultrasound guidance, the left IJ vein was accessed with a 21 gauge micropuncture needle; the needle tip within the vein was confirmed with ultrasound image documentation. 87F dual-lumen cuffed powerPICC tunneled from a righ left t anterior chest wall approach to the dermatotomy site. Needle exchanged over the 018 guidewire for transitional dilator, through which the catheter which had been cut to 28 cm was advanced under intermittent fluoroscopy, positioned with its tip at the cavoatrial junction. Spot chest radiograph confirms good catheter position. No pneumothorax. Catheter was flushed per protocol. Catheter  secured externally with O Prolene suture. The left IJ dermatotomy site was closed with Dermabond. COMPLICATIONS: COMPLICATIONS None immediate FLUOROSCOPY TIME:  30 seconds, 3 mGy COMPARISON:  None IMPRESSION: 1. Technically successful placement of tunneled left IJ tunneled dual-lumen power injectable catheter with ultrasound and fluoroscopic guidance. Ready for routine use.  Electronically Signed   By: Lucrezia Europe M.D.   On: 09/24/2015 15:16   Dg Chest Port 1 View  09/28/2015  CLINICAL DATA:  Shortness of Breath EXAM: PORTABLE CHEST 1 VIEW COMPARISON:  09/26/2015 FINDINGS: Cardiac shadow remains enlarged. Postsurgical changes are again seen. A left jugular PICC line is again identified with the catheter tip in the proximal superior vena cava. It appears to have withdrawn slightly from the prior exam. Persistent left basilar infiltrate and effusion are noted. Small right-sided pleural effusion remains. No other focal abnormality is noted. IMPRESSION: Stable bibasilar changes. Electronically Signed   By: Inez Catalina M.D.   On: 09/28/2015 07:49   Dg Chest Port 1 View  09/26/2015  CLINICAL DATA:  CHF, left pleural effusion. EXAM: PORTABLE CHEST 1 VIEW COMPARISON:  Portable chest x-ray of September 25, 2015 FINDINGS: The lungs are adequately inflated. There is persistent left lower lobe atelectasis or pneumonia. There are small bilateral pleural effusions greater on the left than on the right. The cardiac silhouette remains enlarged. The pulmonary vascularity remains engorged. The pulmonary interstitial markings are slightly less conspicuous today. The patient has undergone previous CABG. The left internal jugular venous catheter tip projects over the proximal SVC. IMPRESSION: Slight interval improvement in pulmonary interstitial edema. There remains left basilar atelectasis and mild CHF with small bilateral pleural effusions. Electronically Signed   By: David  Martinique M.D.   On: 09/26/2015 07:53   Dg Chest Port 1 View  09/25/2015  CLINICAL DATA:  Shortness of breath, history of asthma, Coronary artery disease, previous CABG, diabetes. EXAM: PORTABLE CHEST 1 VIEW COMPARISON:  Portable chest x-ray of September 24, 2015 FINDINGS: The lungs are borderline hypoinflated. The pulmonary interstitial markings remain increased. The left hemidiaphragm remains obscured. The cardiac silhouette remains  enlarged and the pulmonary vascularity engorged. The feeding tube tip projects below the inferior margin of the image. The left internal jugular venous catheter tip projects over the proximal SVC. The bony thorax exhibits no acute abnormality. IMPRESSION: CHF with pulmonary interstitial edema with stable left basilar atelectasis and small effusion. No significant change since yesterday's study. Electronically Signed   By: David  Martinique M.D.   On: 09/25/2015 08:01   Dg Chest Port 1 View  09/24/2015  CLINICAL DATA:  Pulmonary atelectasis.  Shortness of breath today. EXAM: PORTABLE CHEST 1 VIEW COMPARISON:  09/23/2015 FINDINGS: Right internal jugular venous access sheath remains in place. There has been previous median sternotomy and CABG. There continues to be left lower lobe collapse with a small amount of pleural fluid on the left. Mild volume loss at the right base. Possible pulmonary venous hypertension. IMPRESSION: No change.  Persistent left lower lobe collapse and left effusion. Electronically Signed   By: Nelson Chimes M.D.   On: 09/24/2015 07:38   Dg Chest Port 1 View  09/23/2015  CLINICAL DATA:  Atelectasis EXAM: PORTABLE CHEST 1 VIEW COMPARISON:  Chest radiograph from one day prior. FINDINGS: Right internal jugular central venous sheath terminates in the right brachiocephalic vein. Sternotomy wires appear aligned and intact. Stable cardiomediastinal silhouette with mild-to-moderate cardiomegaly. No pneumothorax. Stable small left pleural effusion. Mild-to-moderate pulmonary edema is not appreciably changed. Left greater than  right lung base opacities, slightly decreased on the right and stable on the left. IMPRESSION: 1. Stable mild-to-moderate congestive heart failure. 2. Stable small left pleural effusion. 3. Bibasilar lung opacities, left greater than right, slightly improved on the right and stable on the left, favor atelectasis. Electronically Signed   By: Ilona Sorrel M.D.   On: 09/23/2015 08:09    Dg Chest Port 1 View  09/22/2015  CLINICAL DATA:  Chest tubes removed per RN. EXAM: PORTABLE CHEST 1 VIEW COMPARISON:  Chest radiograph from one day prior. FINDINGS: Sternotomy wires appear aligned and intact. Right internal jugular central venous sheath terminates in the upper third of the superior vena cava. Slightly low lung volumes. Stable cardiomediastinal silhouette with mild cardiomegaly. No pneumothorax. Stable small left pleural effusion. Mild-to-moderate pulmonary edema appears slightly worsened. Bilateral lower lobe opacities appear unchanged, favor atelectasis. IMPRESSION: 1. Mild-to-moderate congestive heart failure, slightly worsened. 2. Stable small left pleural effusion. 3. Stable bilateral lower lobe opacities, favor atelectasis. Electronically Signed   By: Ilona Sorrel M.D.   On: 09/22/2015 10:08   Dg Chest Port 1 View  09/21/2015  CLINICAL DATA:  Hypertension.  Diabetes.  Post CABG. EXAM: PORTABLE CHEST 1 VIEW COMPARISON:  09/20/2015; 09/19/2015; 09/14/2015 FINDINGS: Grossly unchanged enlarged cardiac silhouette and mediastinal contours given patient rotation. Interval removal of right jugular approach PA catheter with remaining vascular sheath tip overlying the mid SVC. No pneumothorax. Mild pulmonary venous congestion without frank evidence of edema. There is a minimal amount of pleural parenchymal thickening about the right minor fissure. Trace bilateral effusions are not excluded. Bibasilar heterogeneous/consolidative opacities are unchanged. No new focal airspace opacities. Unchanged bones. IMPRESSION: 1. Remaining support apparatus as above.  No pneumothorax. 2. Pulmonary is congestion without frank evidence of edema. 3. Unchanged bibasilar opacities, left greater than right, likely atelectasis. Electronically Signed   By: Sandi Mariscal M.D.   On: 09/21/2015 07:47   Dg Chest Port 1 View  09/20/2015  CLINICAL DATA:  Post CABG, hypertension, type II diabetes mellitus, asthma EXAM:  PORTABLE CHEST 1 VIEW COMPARISON:  Portable exam 0628 hours compared to 09/19/2015 FINDINGS: Swan-Ganz catheter tip projects over RIGHT pulmonary artery. LEFT thoracostomy tube stable. Enlargement of cardiac silhouette post CABG. Atherosclerotic calcification aorta. Bibasilar atelectasis. Significant portions of the upper lobes and lung apices obscured by patient's head. No gross pleural effusion. Unable to assess for pneumothorax due to obscuration of the upper lobes. IMPRESSION: Bibasilar atelectasis. Enlargement of cardiac silhouette post CABG. Electronically Signed   By: Lavonia Dana M.D.   On: 09/20/2015 08:06   Dg Chest Port 1 View  09/19/2015  CLINICAL DATA:  Coronary artery disease.  Status post CABG. EXAM: PORTABLE CHEST 1 VIEW COMPARISON:  09/14/2015 FINDINGS: Endotracheal tube and NG tube and chest tubes appear in good position. Swan-Ganz catheter tip is in the right main pulmonary artery. The lungs are clear. Very tiny left apical pneumothorax. Heart size and vascularity are normal. Aortic atherosclerosis. IMPRESSION: Very tiny left apical pneumothorax. Tubes and lines appear in good position. Electronically Signed   By: Lorriane Shire M.D.   On: 09/19/2015 16:39   Dg Abd Portable 1v  09/24/2015  CLINICAL DATA:  Feeding tube placement EXAM: PORTABLE ABDOMEN - 1 VIEW COMPARISON:  Nine/ 19/15 FINDINGS: There is normal small bowel gas pattern. The patient is status post CABG. There is and she feeding tube with tip in and distal stomach. IMPRESSION: NG feeding tube with tip in distal stomach. Electronically Signed   By: Julien Girt  Pop M.D.   On: 09/24/2015 11:12   Dg Swallowing Func-speech Pathology  09/26/2015  Objective Swallowing Evaluation: Type of Study: MBS-Modified Barium Swallow Study Patient Details Name: Feliza Fuhriman MRN: [id number] Date of Birth: [birth year] Today's Date: 09/26/2015 Time: SLP Start Time (ACUTE ONLY): 1405-SLP Stop Time (ACUTE ONLY): 1422 SLP Time Calculation (min) (ACUTE ONLY): 17  min Past Medical History: Past Medical History Diagnosis Date . Peptic ulcer  . Mass  . Essential hypertension  . Hyperlipidemia  . Type 2 diabetes mellitus ([place])  . Asthma  . History of blood transfusion  . CKD (chronic kidney disease), stage III  Past Surgical History: Past Surgical History Procedure Laterality Date . Abdominal hysterectomy   . Colon surgery   . Cardiac catheterization N/A 09/17/2015   Procedure: Left Heart Cath and Coronary Angiography;  Surgeon: Jettie Booze, MD;  Location: [facility];  Service: Cardiovascular;  Laterality: N/A; . Coronary artery bypass graft N/A 09/19/2015   Procedure: CORONARY ARTERY BYPASS GRAFTING (CABG) TIMES FOUR USING BILATARAL SAPHENOUS VEIN GRAFTS AND LEFT INTERNAL MAMMARY ARTERY;  Surgeon: [name], MD;  Location: [place];  Service: Open Heart Surgery;  Laterality: N/A; . Tee without cardioversion N/A 09/19/2015   Procedure: TRANSESOPHAGEAL ECHOCARDIOGRAM (TEE);  Surgeon: [name], MD;  Location: [place];  Service: Open Heart Surgery;  Laterality: N/A; HPI: Jayona Mimnaugh is a 82 y.o. female with PMH of hypertension, hyperlipidemia, diabetes mellitus, asthma, GERD, gout, chronic kidney disease-stage III, peptic ulcer disease, who presents with chest pain.  The patient is s/p CABG x 4 and per nursing has been coughing with intake.  Per nursing the patient was relatively independent prior to admission with not reported swallowing issues.  Current chest xray is showing stable mild-to-moderate congestive heart failure, stable small left pleural effusion and bibasilar lung opacities, left greater than right, slightly Subjective: pt alert, daughter says she is clearing her throat throughout the day since surgery Assessment / Plan / Recommendation CHL IP CLINICAL IMPRESSIONS 09/26/2015 Therapy Diagnosis Moderate oral phase dysphagia;Mild pharyngeal phase dysphagia;Mild oral phase dysphagia Clinical Impression Pt has a mild-moderate oropharyngeal  dysphagia, although with overall visibility reduced due to shoulders/body habitus limiting view. She does have slow oral transit with premature spillage to the valleculae. Mild residue remained in the valleculae post-swallow with trace, shallow penetration observed x1 after the swallow with thin liquids. Pt cleared with cued throat clear. Although it was difficult to visualize the upper esophagus, there did appear to be backflow to the level of the UES x1. Throat clearing occurred throughout the duration of testing but was not correlated with airway compromise. Recommend to continue with current textures. Impact on safety and function Mild aspiration risk;Moderate aspiration risk   CHL IP TREATMENT RECOMMENDATION 09/26/2015 Treatment Recommendations Therapy as outlined in treatment plan below   Prognosis 09/26/2015 Prognosis for Safe Diet Advancement Good Barriers to Reach Goals Cognitive deficits Barriers/Prognosis Comment -- [facility] IP DIET RECOMMENDATION 09/26/2015 SLP Diet Recommendations Dysphagia 1 (Puree) solids;Nectar thick liquid Liquid Administration via Cup;Straw Medication Administration Crushed with puree Compensations Minimize environmental distractions;Slow rate;Small sips/bites Postural Changes Remain semi-upright after after feeds/meals (Comment);Seated upright at 90 degrees   [facility] IP OTHER RECOMMENDATIONS 09/26/2015 Recommended Consults -- Oral Care Recommendations Oral care BID Other Recommendations Order thickener from pharmacy;Prohibited food (jello, ice cream, thin soups);Remove water pitcher   [facility] IP FOLLOW UP RECOMMENDATIONS 09/26/2015 Follow up Recommendations Skilled Nursing facility   [facility] IP FREQUENCY AND DURATION 09/26/2015 Speech Therapy Frequency (ACUTE ONLY)  min 2x/week Treatment Duration 2 weeks      [facility] IP ORAL PHASE 09/26/2015 Oral Phase Impaired Oral - Pudding Teaspoon -- Oral - Pudding Cup -- Oral - Honey Teaspoon -- Oral - Honey Cup -- Oral - Nectar Teaspoon -- Oral - Nectar Cup Delayed oral  transit;Premature spillage;Weak lingual manipulation Oral - Nectar Straw Delayed oral transit;Premature spillage;Weak lingual manipulation Oral - Thin Teaspoon -- Oral - Thin Cup Delayed oral transit;Premature spillage;Weak lingual manipulation Oral - Thin Straw -- Oral - Puree Delayed oral transit;Premature spillage;Weak lingual manipulation Oral - Mech Soft -- Oral - Regular -- Oral - Multi-Consistency -- Oral - Pill -- Oral Phase - Comment --  [facility] IP PHARYNGEAL PHASE 09/26/2015 Pharyngeal Phase Impaired Pharyngeal- Pudding Teaspoon -- Pharyngeal -- Pharyngeal- Pudding Cup -- Pharyngeal -- Pharyngeal- Honey Teaspoon -- Pharyngeal -- Pharyngeal- Honey Cup -- Pharyngeal -- Pharyngeal- Nectar Teaspoon -- Pharyngeal -- Pharyngeal- Nectar Cup Delayed swallow initiation-vallecula;Reduced tongue base retraction;Pharyngeal residue - valleculae Pharyngeal -- Pharyngeal- Nectar Straw Delayed swallow initiation-vallecula;Reduced tongue base retraction;Pharyngeal residue - valleculae Pharyngeal -- Pharyngeal- Thin Teaspoon -- Pharyngeal -- Pharyngeal- Thin Cup Delayed swallow initiation-vallecula;Reduced tongue base retraction;Pharyngeal residue - valleculae;Penetration/Apiration after swallow Pharyngeal Material enters airway, remains ABOVE vocal cords and not ejected out Pharyngeal- Thin Straw -- Pharyngeal -- Pharyngeal- Puree Delayed swallow initiation-vallecula;Reduced tongue base retraction;Pharyngeal residue - valleculae Pharyngeal -- Pharyngeal- Mechanical Soft -- Pharyngeal -- Pharyngeal- Regular -- Pharyngeal -- Pharyngeal- Multi-consistency -- Pharyngeal -- Pharyngeal- Pill -- Pharyngeal -- Pharyngeal Comment --  [facility] IP CERVICAL ESOPHAGEAL PHASE 09/26/2015 Cervical Esophageal Phase (No Data) Pudding Teaspoon -- Pudding Cup -- Honey Teaspoon -- Honey Cup -- Nectar Teaspoon -- Nectar Cup -- Nectar Straw -- Thin Teaspoon -- Thin Cup -- Thin Straw -- Puree -- Mechanical Soft -- Regular -- Multi-consistency -- Pill --  Cervical Esophageal Comment -- [facility] IP GO 09/23/2015 Functional Assessment Tool Used ASHA NOMS and clinical judgment.   Functional Limitations Swallowing Swallow Current Status [phone]) CN Swallow Goal Status [id number]) CK Swallow Discharge Status [phone]) (None) Motor Speech Current Status [phone]) (None) Motor Speech Goal Status [phone]) (None) Motor Speech Goal Status [phone]) (None) Spoken Language Comprehension Current Status [phone]) (None) Spoken Language Comprehension Goal Status [id number]) (None) Spoken Language Comprehension Discharge Status [phone]) (None) Spoken Language Expression Current Status [phone]) (None) Spoken Language Expression Goal Status [phone]) (None) Spoken Language Expression Discharge Status [phone]) (None) Attention Current Status [id number]) (None) Attention Goal Status [id number]) (None) Attention Discharge Status [phone]) (None) Memory Current Status [id number]) (None) Memory Goal Status [id number]) (None) Memory Discharge Status [id number]) (None) Voice Current Status [id number]) (None) Voice Goal Status [id number]) (None) Voice Discharge Status [id number]) (None) Other Speech-Language Pathology Functional Limitation [phone]) (None) Other Speech-Language Pathology Functional Limitation Goal Status [id number]) (None) Other Speech-Language Pathology Functional Limitation Discharge Status [phone]) (None) Germain Osgood, M.A. CCC-SLP [phone] Germain Osgood 09/26/2015, 3:05 PM              Ir Cyndy Freeze Guide Cv Midline Picc Left  09/24/2015  CLINICAL DATA:  Altered mental status, renal failure, needs venous access for infusion therapy EXAM: TUNNELED CENTRAL VENOUS CATHETER PLACEMENT WITH ULTRASOUND AND FLUOROSCOPIC GUIDANCE TECHNIQUE: The procedure, risks, benefits, and alternatives were explained to the family. Questions regarding the procedure were encouraged and answered. The family understands and consents to the procedure. An appropriate skin site was determined. Region was prepped using maximum barrier technique including cap  and mask, sterile gown, sterile gloves, large sterile sheet, and Chlorhexidine as cutaneous antisepsis. The region was infiltrated locally  with 1% lidocaine. Left IJ approach was selected because of an indwelling right jugular catheter. Under real-time ultrasound guidance, the left IJ vein was accessed with a 21 gauge micropuncture needle; the needle tip within the vein was confirmed with ultrasound image documentation. 88F dual-lumen cuffed powerPICC tunneled from a righ left t anterior chest wall approach to the dermatotomy site. Needle exchanged over the 018 guidewire for transitional dilator, through which the catheter which had been cut to 28 cm was advanced under intermittent fluoroscopy, positioned with its tip at the cavoatrial junction. Spot chest radiograph confirms good catheter position. No pneumothorax. Catheter was flushed per protocol. Catheter secured externally with O Prolene suture. The left IJ dermatotomy site was closed with Dermabond. COMPLICATIONS: COMPLICATIONS None immediate FLUOROSCOPY TIME:  30 seconds, 3 mGy COMPARISON:  None IMPRESSION: 1. Technically successful placement of tunneled left IJ tunneled dual-lumen power injectable catheter with ultrasound and fluoroscopic guidance. Ready for routine use. Electronically Signed   By: Lucrezia Europe M.D.   On: 09/24/2015 15:16    ASSESSMENT/PLAN:  Candidal skin infection -  Apply Nystatin powder 100,000 units/gm under bilateral breast BID X 3 weeks    Beckett, NP [place]

## 2015-10-09 LAB — BASIC METABOLIC PANEL
BUN: 48 mg/dL — AB (ref 4–21)
CREATININE: 2.2 mg/dL — AB (ref 0.5–1.1)
Glucose: 88 mg/dL
POTASSIUM: 4.9 mmol/L (ref 3.4–5.3)
Sodium: 140 mmol/L (ref 137–147)

## 2015-10-09 LAB — CBC AND DIFFERENTIAL
HCT: 32 % — AB (ref 36–46)
HEMOGLOBIN: 9.8 g/dL — AB (ref 12.0–16.0)
Neutrophils Absolute: 6 /uL
PLATELETS: 385 10*3/uL (ref 150–399)
WBC: 9.1 10^3/mL

## 2015-10-09 LAB — HEMOGLOBIN A1C: HEMOGLOBIN A1C: 7.6

## 2015-10-10 ENCOUNTER — Non-Acute Institutional Stay: Payer: Medicare HMO | Admitting: Adult Health

## 2015-10-10 ENCOUNTER — Encounter: Payer: Self-pay | Admitting: Adult Health

## 2015-10-10 DIAGNOSIS — N390 Urinary tract infection, site not specified: Secondary | ICD-10-CM

## 2015-10-10 NOTE — Progress Notes (Signed)
Patient ID: Heidemarie Nathanson, female   DOB: 1930/08/23, 82 y.o.   MRN: [id number]    DATE:  10/10/15  MRN:  [id number]  BIRTHDAY: 12/27/1930  Facility:  Nursing Home Location:  [place] Room Number: 908-P  LEVEL OF CARE:  [id number])  Contact Information    Name Relation Home Work Munson Son [phone]     Adrika, Zurfluh [phone]     [name] Daughter [phone]         Code Status History    Date Active Date Inactive Code Status Order ID Comments User Context   09/15/2015  3:24 AM 09/19/2015  3:48 PM Full Code [id number]  Ivor Costa, MD Inpatient       Chief Complaint  Patient presents with  . Acute Visit    UTI    HISTORY OF PRESENT ILLNESS:   This is an 82 year old female who has a urine culture result showing >100,000 CFU/ml E. Coli. No fever nor hematuria reported.  She has been admitted to [facility] on 10/03/15 from [facility]. She has PMH of hypertension, hyperlipidemia, diabetes mellitus, asthma, GERD, gout, chronic kidney disease-stage III and peptic ulcer disease.  She was having chest pains and had cardiac catheterization on 09/17/15. This revealed severe 3 vessel CAD. She then underwent CABG 4 utilizing LIMA to LAD, SVG to ramus intermediate SVG to OM and SVG to distal RCA. She also underwent an endoscopic saphenous vein harvest from the left and right leg. She was transfused 1 unit of packed RBC for expected postoperative blood loss anemia. Speech swallow evaluation was obtained and showed swallowing dysfunction. She initially was kept nothing by mouth and required placement of a panda feeding tube. She developed some acute mental status changes. Modified barium swallow was completed and it was recommended for the patient to be placed on a modified diet. It was felt her changes were likely due to acute embolic encephalopathy. She continued to make progress. Her mental status improved and her swallowing  dysfunction and proved.  She has been admitted for a short-term rehabilitation.   PAST MEDICAL HISTORY:  Past Medical History  Diagnosis Date  . Peptic ulcer   . Mass   . Essential hypertension   . Hyperlipidemia   . Type 2 diabetes mellitus ([place])   . Asthma   . History of blood transfusion   . CKD (chronic kidney disease), stage III   . Physical deconditioning   . Coronary artery disease involving native coronary artery of native heart without angina pectoris   . GERD (gastroesophageal reflux disease)   . Acute blood loss anemia   . Protein calorie malnutrition ([place])   . Chest wall pain   . Asthma   . Dyspnea   . Encounter for central line care   . Candida infection 10/2015    Bilateral breasts     CURRENT MEDICATIONS: Reviewed    Medication List       This list is accurate as of: 10/10/15  7:27 PM.  Always use your most recent med list.               acetaminophen 325 MG tablet  Commonly known as:  TYLENOL  Take 2 tablets (650 mg total) by mouth every 6 (six) hours as needed for mild pain.     albuterol 108 (90 Base) MCG/ACT inhaler  Commonly known as:  PROVENTIL HFA;VENTOLIN HFA  Inhale 2 puffs into the lungs every 6 (  six) hours as needed for wheezing or shortness of breath.     amLODipine 10 MG tablet  Commonly known as:  NORVASC  Take 10 mg by mouth daily.     aspirin 81 MG EC tablet  Take 1 tablet (81 mg total) by mouth daily.     atorvastatin 40 MG tablet  Commonly known as:  LIPITOR  Take 1 tablet (40 mg total) by mouth daily at 6 PM.     BACTRIM DS PO  Take 1 tablet by mouth 2 (two) times daily. For seven (7) days     Calcium Carb-Cholecalciferol 307 127 5002 MG-UNIT Caps  Take 1 tablet by mouth daily.     calcium carbonate 500 MG chewable tablet  Commonly known as:  TUMS - dosed in mg elemental calcium  Chew 1 tablet by mouth daily as needed for indigestion or heartburn.     diphenhydrAMINE 25 MG tablet  Commonly known as:  BENADRYL  Take 25  mg by mouth as needed for itching or allergies.     ferrous sulfate 325 (65 FE) MG tablet  Take 325 mg by mouth daily with breakfast.     furosemide 20 MG tablet  Commonly known as:  LASIX  Take 20 mg by mouth every other day.     insulin glargine 100 UNIT/ML injection  Commonly known as:  LANTUS  Inject 20 Units into the skin 2 (two) times daily.     levalbuterol 0.63 MG/3ML nebulizer solution  Commonly known as:  XOPENEX  Take 3 mLs (0.63 mg total) by nebulization every 6 (six) hours as needed for wheezing or shortness of breath.     meclizine 12.5 MG tablet  Commonly known as:  ANTIVERT  Take 12.5 mg by mouth 3 (three) times daily as needed for dizziness.     Melatonin 3 MG Tabs  Take 1 tablet by mouth at bedtime.     metoprolol tartrate 25 MG tablet  Commonly known as:  LOPRESSOR  Take 1 tablet (25 mg total) by mouth 2 (two) times daily.     NOVOLOG 100 UNIT/ML injection  Generic drug:  insulin aspart  Inject 0-24 Units into the skin. 0-24 units TID AC and HS as follows: 0-60 = 0 units, hypoglycemic protocol and call MD; 61-119 = 0 units; 120-160 = 2 units; 161-200 =4 units, 201-250 = 8 units, 251-300 = 12 units, 301-350 = 16 units, 351-450 = 20 units, >450 = 24 units and call MD     nystatin powder  Commonly known as:  MYCOSTATIN  Apply topically 2 (two) times daily. 100,000 units/gm powder applied under bilateral breasts BID x3 weeks     PROCEL Powd  Take 2 scoop by mouth 2 (two) times daily.     saccharomyces boulardii 250 MG capsule  Commonly known as:  FLORASTOR  Take 250 mg by mouth 2 (two) times daily. Take for 10 days     traMADol 50 MG tablet  Commonly known as:  ULTRAM  Take 1 tablet (50 mg total) by mouth every 12 (twelve) hours as needed for moderate pain.     Vitamin D-3 1000 units Caps  Take 1 capsule by mouth daily.        No Known Allergies   REVIEW OF SYSTEMS:  GENERAL: no change in appetite, no fatigue, no weight changes, no fever,  chills or weakness EYES: Denies change in vision, dry eyes, eye pain, itching or discharge EARS: Denies change in hearing, ringing in ears, or  earache NOSE: Denies nasal congestion or epistaxis MOUTH and THROAT: Denies oral discomfort, gingival pain or bleeding, pain from teeth or hoarseness   RESPIRATORY: no cough, SOB, DOE, wheezing, hemoptysis CARDIAC: no chest pain, edema or palpitations GI: no abdominal pain, diarrhea, constipation, heart burn, nausea or vomiting GU: Denies dysuria, frequency, hematuria, incontinence, or discharge PSYCHIATRIC: Denies feeling of depression or anxiety. No report of hallucinations, insomnia, paranoia, or agitation   PHYSICAL EXAMINATION  GENERAL APPEARANCE: Well nourished. In no acute distress. Obese SKIN:  Chest midline incision is dry, erythematous and moist rashes under bilateral breast HEAD: Normal in size and contour. No evidence of trauma EYES: Lids open and close normally. No blepharitis, entropion or ectropion. PERRL. Conjunctivae are clear and sclerae are white. Lenses are without opacity EARS: Pinnae are normal. Patient hears normal voice tunes of the examiner MOUTH and THROAT: Lips are without lesions. Oral mucosa is moist and without lesions. Tongue is normal in shape, size, and color and without lesions NECK: supple, trachea midline, no neck masses, no thyroid tenderness, no thyromegaly LYMPHATICS: no LAN in the neck, no supraclavicular LAN RESPIRATORY: breathing is even & unlabored, BS CTAB CARDIAC: RRR, no murmur,no extra heart sounds, no edema GI: abdomen soft, normal BS, no masses, no tenderness, no hepatomegaly, no splenomegaly EXTREMITIES:  Able to move X 4 extremities PSYCHIATRIC: Alert and oriented X 3. Affect and behavior are appropriate  LABS/RADIOLOGY: Labs reviewed: Basic Metabolic Panel:  Recent Labs  09/19/15 2130 09/20/15 0400  09/20/15 1630  09/29/15 0425 09/30/15 0445 10/02/15 10/02/15 0430  NA  --  140  < >  --    < > 143 144 140 140  K  --  4.3  < >  --   < > 4.0 4.2  --  5.0  CL  --  107  < >  --   < > 102 102  --  100*  CO2  --  22  --   --   < > 31 32  --  32  GLUCOSE  --  118*  < >  --   < > 124* 70  --  121*  BUN  --  20  < >  --   < > 62* 59* 60* 60*  CREATININE 1.61* 1.98*  < > 2.63*  < > 2.02* 2.00* 2.0* 2.04*  CALCIUM  --  7.1*  --   --   < > 8.1* 8.2*  --  8.5*  MG 2.1 2.0  --  2.1  --   --   --   --   --   < > = values in this interval not displayed. Liver Function Tests:  Recent Labs  09/23/15 0421 09/24/15 0419 09/25/15 0417  AST 24 24 58*  ALT 30 29 44  ALKPHOS 63 50 68  BILITOT 0.4 0.6 0.4  PROT 5.3* 5.7* 5.7*  ALBUMIN 2.0* 2.1* 2.0*    CBC:  Recent Labs  09/28/15 0503 09/29/15 0425 10/02/15 10/02/15 0430  WBC 13.0* 13.8* 13.8 13.8*  HGB 8.1* 8.4*  --  8.2*  HCT 26.0* 25.6*  --  27.1*  MCV 90.3 90.8  --  92.8  PLT 474* 489*  --  537*   Lipid Panel:  Recent Labs  09/15/15 0343  HDL 35*   Cardiac Enzymes:  Recent Labs  09/15/15 0343 09/15/15 0818 09/15/15 1454  TROPONINI 0.03 <0.03 <0.03    CBG:  Recent Labs  10/02/15 2113 10/03/15 0619 10/03/15 1117  GLUCAP 155* 158* 177*      Dg Chest 2 View  10/01/2015  CLINICAL DATA:  Pleural effusions.  Weakness.  Shortness of breath. EXAM: CHEST  2 VIEW COMPARISON:  09/28/2015. FINDINGS: Left IJ line stable position. Mediastinum hilar structures are normal. Prior CABG. Stable cardiomegaly. Improving pulmonary venous congestion and interstitial prominence consistent with improving congestive heart failure. Low lung volumes with basilar atelectasis. Small stable left pleural effusion. IMPRESSION: 1. Left IJ line stable position. 2. Prior CABG. Cardiomegaly with interim resolution of pulmonary vascular prominence and improving interstitial prominence suggesting improving congestive heart failure. 3. Low lung volumes with mild bibasilar atelectasis and/or residual infiltrates. Small unchanged left pleural  effusion again noted. Electronically Signed   By: Marcello Moores  Register   On: 10/01/2015 07:39   Dg Chest 2 View  09/14/2015  CLINICAL DATA:  Chest pain just prior to arrival. EXAM: CHEST  2 VIEW COMPARISON:  11/22/2012 FINDINGS: Stable mediastinal contours with borderline cardiomegaly. No pulmonary edema, confluent airspace disease, pleural effusion or pneumothorax. Exaggerated thoracic kyphosis and degenerative change throughout the thoracic spine, stable. IMPRESSION: Unchanged appearance of the chest.  No acute process. Electronically Signed   By: Jeb Levering M.D.   On: 09/14/2015 23:04   Ct Head Wo Contrast  09/24/2015  CLINICAL DATA:  Confusion, swallowing difficulty, status post CABG 5 days ago EXAM: CT HEAD WITHOUT CONTRAST TECHNIQUE: Contiguous axial images were obtained from the base of the skull through the vertex without intravenous contrast. COMPARISON:  None. FINDINGS: Motion degraded images. No evidence of parenchymal hemorrhage or extra-axial fluid collection. No mass lesion, mass effect, or midline shift. No CT evidence of acute infarction. Old left basal ganglia lacunar infarct. Subcortical white matter and periventricular small vessel ischemic changes. Intracranial atherosclerosis. Global cortical and central atrophy.  No ventriculomegaly. Partial opacification of the right ethmoid sinus. The mastoid air cells are unopacified. Right nasal tube. No evidence of calvarial fracture. IMPRESSION: No evidence of acute intracranial abnormality. Old left basal ganglia lacunar infarct. Atrophy with small vessel ischemic changes. Electronically Signed   By: Julian Hy M.D.   On: 09/24/2015 10:51   Ir Removal Tun Cv Cath W/o Fl  10/08/2015  CLINICAL DATA:  Tunneled IJ PICC placed 10/02/2015 by me for infusion therapy, worked well without complication, no longer required. EXAM: TUNNELED CENTRAL VENOUS CATHETER REMOVAL TECHNIQUE: Overlying skin prepped with chlorhexidine, draped in usual sterile  fashion, infiltrated locally with 1% lidocaine. The previously placed left IJ tunneled central venous catheter was dissected free from the underlying soft tissues and removed intact. Hemostasis was achieved. Site covered with a sterile dressing. The patient tolerated the procedure well. COMPLICATIONS: none IMPRESSION: 1. Technically successful tunneled left IJ central venous catheter removal. Electronically Signed   By: Lucrezia Europe M.D.   On: 10/08/2015 08:45   Ir US Guide Vasc Access Left  09/24/2015  CLINICAL DATA:  Altered mental status, renal failure, needs venous access for infusion therapy EXAM: TUNNELED CENTRAL VENOUS CATHETER PLACEMENT WITH ULTRASOUND AND FLUOROSCOPIC GUIDANCE TECHNIQUE: The procedure, risks, benefits, and alternatives were explained to the family. Questions regarding the procedure were encouraged and answered. The family understands and consents to the procedure. An appropriate skin site was determined. Region was prepped using maximum barrier technique including cap and mask, sterile gown, sterile gloves, large sterile sheet, and Chlorhexidine as cutaneous antisepsis. The region was infiltrated locally with 1% lidocaine. Left IJ approach was selected because of an indwelling right jugular catheter. Under real-time ultrasound guidance, the left  IJ vein was accessed with a 21 gauge micropuncture needle; the needle tip within the vein was confirmed with ultrasound image documentation. 38F dual-lumen cuffed powerPICC tunneled from a righ left t anterior chest wall approach to the dermatotomy site. Needle exchanged over the 018 guidewire for transitional dilator, through which the catheter which had been cut to 28 cm was advanced under intermittent fluoroscopy, positioned with its tip at the cavoatrial junction. Spot chest radiograph confirms good catheter position. No pneumothorax. Catheter was flushed per protocol. Catheter secured externally with O Prolene suture. The left IJ dermatotomy  site was closed with Dermabond. COMPLICATIONS: COMPLICATIONS None immediate FLUOROSCOPY TIME:  30 seconds, 3 mGy COMPARISON:  None IMPRESSION: 1. Technically successful placement of tunneled left IJ tunneled dual-lumen power injectable catheter with ultrasound and fluoroscopic guidance. Ready for routine use. Electronically Signed   By: Lucrezia Europe M.D.   On: 09/24/2015 15:16   Dg Chest Port 1 View  09/28/2015  CLINICAL DATA:  Shortness of Breath EXAM: PORTABLE CHEST 1 VIEW COMPARISON:  09/26/2015 FINDINGS: Cardiac shadow remains enlarged. Postsurgical changes are again seen. A left jugular PICC line is again identified with the catheter tip in the proximal superior vena cava. It appears to have withdrawn slightly from the prior exam. Persistent left basilar infiltrate and effusion are noted. Small right-sided pleural effusion remains. No other focal abnormality is noted. IMPRESSION: Stable bibasilar changes. Electronically Signed   By: Inez Catalina M.D.   On: 09/28/2015 07:49   Dg Chest Port 1 View  09/26/2015  CLINICAL DATA:  CHF, left pleural effusion. EXAM: PORTABLE CHEST 1 VIEW COMPARISON:  Portable chest x-ray of September 25, 2015 FINDINGS: The lungs are adequately inflated. There is persistent left lower lobe atelectasis or pneumonia. There are small bilateral pleural effusions greater on the left than on the right. The cardiac silhouette remains enlarged. The pulmonary vascularity remains engorged. The pulmonary interstitial markings are slightly less conspicuous today. The patient has undergone previous CABG. The left internal jugular venous catheter tip projects over the proximal SVC. IMPRESSION: Slight interval improvement in pulmonary interstitial edema. There remains left basilar atelectasis and mild CHF with small bilateral pleural effusions. Electronically Signed   By: David  Martinique M.D.   On: 09/26/2015 07:53   Dg Chest Port 1 View  09/25/2015  CLINICAL DATA:  Shortness of breath, history of  asthma, Coronary artery disease, previous CABG, diabetes. EXAM: PORTABLE CHEST 1 VIEW COMPARISON:  Portable chest x-ray of September 24, 2015 FINDINGS: The lungs are borderline hypoinflated. The pulmonary interstitial markings remain increased. The left hemidiaphragm remains obscured. The cardiac silhouette remains enlarged and the pulmonary vascularity engorged. The feeding tube tip projects below the inferior margin of the image. The left internal jugular venous catheter tip projects over the proximal SVC. The bony thorax exhibits no acute abnormality. IMPRESSION: CHF with pulmonary interstitial edema with stable left basilar atelectasis and small effusion. No significant change since yesterday's study. Electronically Signed   By: David  Martinique M.D.   On: 09/25/2015 08:01   Dg Chest Port 1 View  09/24/2015  CLINICAL DATA:  Pulmonary atelectasis.  Shortness of breath today. EXAM: PORTABLE CHEST 1 VIEW COMPARISON:  09/23/2015 FINDINGS: Right internal jugular venous access sheath remains in place. There has been previous median sternotomy and CABG. There continues to be left lower lobe collapse with a small amount of pleural fluid on the left. Mild volume loss at the right base. Possible pulmonary venous hypertension. IMPRESSION: No change.  Persistent left lower  lobe collapse and left effusion. Electronically Signed   By: Nelson Chimes M.D.   On: 09/24/2015 07:38   Dg Chest Port 1 View  09/23/2015  CLINICAL DATA:  Atelectasis EXAM: PORTABLE CHEST 1 VIEW COMPARISON:  Chest radiograph from one day prior. FINDINGS: Right internal jugular central venous sheath terminates in the right brachiocephalic vein. Sternotomy wires appear aligned and intact. Stable cardiomediastinal silhouette with mild-to-moderate cardiomegaly. No pneumothorax. Stable small left pleural effusion. Mild-to-moderate pulmonary edema is not appreciably changed. Left greater than right lung base opacities, slightly decreased on the right and stable  on the left. IMPRESSION: 1. Stable mild-to-moderate congestive heart failure. 2. Stable small left pleural effusion. 3. Bibasilar lung opacities, left greater than right, slightly improved on the right and stable on the left, favor atelectasis. Electronically Signed   By: Ilona Sorrel M.D.   On: 09/23/2015 08:09   Dg Chest Port 1 View  09/22/2015  CLINICAL DATA:  Chest tubes removed per RN. EXAM: PORTABLE CHEST 1 VIEW COMPARISON:  Chest radiograph from one day prior. FINDINGS: Sternotomy wires appear aligned and intact. Right internal jugular central venous sheath terminates in the upper third of the superior vena cava. Slightly low lung volumes. Stable cardiomediastinal silhouette with mild cardiomegaly. No pneumothorax. Stable small left pleural effusion. Mild-to-moderate pulmonary edema appears slightly worsened. Bilateral lower lobe opacities appear unchanged, favor atelectasis. IMPRESSION: 1. Mild-to-moderate congestive heart failure, slightly worsened. 2. Stable small left pleural effusion. 3. Stable bilateral lower lobe opacities, favor atelectasis. Electronically Signed   By: Ilona Sorrel M.D.   On: 09/22/2015 10:08   Dg Chest Port 1 View  09/21/2015  CLINICAL DATA:  Hypertension.  Diabetes.  Post CABG. EXAM: PORTABLE CHEST 1 VIEW COMPARISON:  09/20/2015; 09/19/2015; 09/14/2015 FINDINGS: Grossly unchanged enlarged cardiac silhouette and mediastinal contours given patient rotation. Interval removal of right jugular approach PA catheter with remaining vascular sheath tip overlying the mid SVC. No pneumothorax. Mild pulmonary venous congestion without frank evidence of edema. There is a minimal amount of pleural parenchymal thickening about the right minor fissure. Trace bilateral effusions are not excluded. Bibasilar heterogeneous/consolidative opacities are unchanged. No new focal airspace opacities. Unchanged bones. IMPRESSION: 1. Remaining support apparatus as above.  No pneumothorax. 2. Pulmonary is  congestion without frank evidence of edema. 3. Unchanged bibasilar opacities, left greater than right, likely atelectasis. Electronically Signed   By: Sandi Mariscal M.D.   On: 09/21/2015 07:47   Dg Chest Port 1 View  09/20/2015  CLINICAL DATA:  Post CABG, hypertension, type II diabetes mellitus, asthma EXAM: PORTABLE CHEST 1 VIEW COMPARISON:  Portable exam 0628 hours compared to 09/19/2015 FINDINGS: Swan-Ganz catheter tip projects over RIGHT pulmonary artery. LEFT thoracostomy tube stable. Enlargement of cardiac silhouette post CABG. Atherosclerotic calcification aorta. Bibasilar atelectasis. Significant portions of the upper lobes and lung apices obscured by patient's head. No gross pleural effusion. Unable to assess for pneumothorax due to obscuration of the upper lobes. IMPRESSION: Bibasilar atelectasis. Enlargement of cardiac silhouette post CABG. Electronically Signed   By: Lavonia Dana M.D.   On: 09/20/2015 08:06   Dg Chest Port 1 View  09/19/2015  CLINICAL DATA:  Coronary artery disease.  Status post CABG. EXAM: PORTABLE CHEST 1 VIEW COMPARISON:  09/14/2015 FINDINGS: Endotracheal tube and NG tube and chest tubes appear in good position. Swan-Ganz catheter tip is in the right main pulmonary artery. The lungs are clear. Very tiny left apical pneumothorax. Heart size and vascularity are normal. Aortic atherosclerosis. IMPRESSION: Very tiny left apical  pneumothorax. Tubes and lines appear in good position. Electronically Signed   By: Lorriane Shire M.D.   On: 09/19/2015 16:39   Dg Abd Portable 1v  09/24/2015  CLINICAL DATA:  Feeding tube placement EXAM: PORTABLE ABDOMEN - 1 VIEW COMPARISON:  Nine/ 19/15 FINDINGS: There is normal small bowel gas pattern. The patient is status post CABG. There is and she feeding tube with tip in and distal stomach. IMPRESSION: NG feeding tube with tip in distal stomach. Electronically Signed   By: Lahoma Crocker M.D.   On: 09/24/2015 11:12   Dg Swallowing Func-speech  Pathology  09/26/2015  Objective Swallowing Evaluation: Type of Study: MBS-Modified Barium Swallow Study Patient Details Name: Amouri Big MRN: [id number] Date of Birth: 1930/10/24 Today's Date: 09/26/2015 Time: SLP Start Time (ACUTE ONLY): 1405-SLP Stop Time (ACUTE ONLY): 1422 SLP Time Calculation (min) (ACUTE ONLY): 17 min Past Medical History: Past Medical History Diagnosis Date . Peptic ulcer  . Mass  . Essential hypertension  . Hyperlipidemia  . Type 2 diabetes mellitus ([place])  . Asthma  . History of blood transfusion  . CKD (chronic kidney disease), stage III  Past Surgical History: Past Surgical History Procedure Laterality Date . Abdominal hysterectomy   . Colon surgery   . Cardiac catheterization N/A 09/17/2015   Procedure: Left Heart Cath and Coronary Angiography;  Surgeon: Jettie Booze, MD;  Location: [facility];  Service: Cardiovascular;  Laterality: N/A; . Coronary artery bypass graft N/A 09/19/2015   Procedure: CORONARY ARTERY BYPASS GRAFTING (CABG) TIMES FOUR USING BILATARAL SAPHENOUS VEIN GRAFTS AND LEFT INTERNAL MAMMARY ARTERY;  Surgeon: [name], MD;  Location: [facility];  Service: Open Heart Surgery;  Laterality: N/A; . Tee without cardioversion N/A 09/19/2015   Procedure: TRANSESOPHAGEAL ECHOCARDIOGRAM (TEE);  Surgeon: [name], MD;  Location: [facility];  Service: Open Heart Surgery;  Laterality: N/A; HPI: Kellen Bohl is a 82 y.o. female with PMH of hypertension, hyperlipidemia, diabetes mellitus, asthma, GERD, gout, chronic kidney disease-stage III, peptic ulcer disease, who presents with chest pain.  The patient is s/p CABG x 4 and per nursing has been coughing with intake.  Per nursing the patient was relatively independent prior to admission with not reported swallowing issues.  Current chest xray is showing stable mild-to-moderate congestive heart failure, stable small left pleural effusion and bibasilar lung opacities, left greater than right, slightly Subjective: pt alert,  daughter says she is clearing her throat throughout the day since surgery Assessment / Plan / Recommendation CHL IP CLINICAL IMPRESSIONS 09/26/2015 Therapy Diagnosis Moderate oral phase dysphagia;Mild pharyngeal phase dysphagia;Mild oral phase dysphagia Clinical Impression Pt has a mild-moderate oropharyngeal dysphagia, although with overall visibility reduced due to shoulders/body habitus limiting view. She does have slow oral transit with premature spillage to the valleculae. Mild residue remained in the valleculae post-swallow with trace, shallow penetration observed x1 after the swallow with thin liquids. Pt cleared with cued throat clear. Although it was difficult to visualize the upper esophagus, there did appear to be backflow to the level of the UES x1. Throat clearing occurred throughout the duration of testing but was not correlated with airway compromise. Recommend to continue with current textures. Impact on safety and function Mild aspiration risk;Moderate aspiration risk   [facility] IP TREATMENT RECOMMENDATION 09/26/2015 Treatment Recommendations Therapy as outlined in treatment plan below   Prognosis 09/26/2015 Prognosis for Safe Diet Advancement Good Barriers to Reach Goals Cognitive deficits Barriers/Prognosis Comment -- [facility] IP DIET RECOMMENDATION 09/26/2015 SLP Diet Recommendations Dysphagia  1 (Puree) solids;Nectar thick liquid Liquid Administration via Cup;Straw Medication Administration Crushed with puree Compensations Minimize environmental distractions;Slow rate;Small sips/bites Postural Changes Remain semi-upright after after feeds/meals (Comment);Seated upright at 90 degrees   [facility] IP OTHER RECOMMENDATIONS 09/26/2015 Recommended Consults -- Oral Care Recommendations Oral care BID Other Recommendations Order thickener from pharmacy;Prohibited food (jello, ice cream, thin soups);Remove water pitcher   [facility] IP FOLLOW UP RECOMMENDATIONS 09/26/2015 Follow up Recommendations Skilled Nursing facility   [facility] IP  FREQUENCY AND DURATION 09/26/2015 Speech Therapy Frequency (ACUTE ONLY) min 2x/week Treatment Duration 2 weeks      [facility] IP ORAL PHASE 09/26/2015 Oral Phase Impaired Oral - Pudding Teaspoon -- Oral - Pudding Cup -- Oral - Honey Teaspoon -- Oral - Honey Cup -- Oral - Nectar Teaspoon -- Oral - Nectar Cup Delayed oral transit;Premature spillage;Weak lingual manipulation Oral - Nectar Straw Delayed oral transit;Premature spillage;Weak lingual manipulation Oral - Thin Teaspoon -- Oral - Thin Cup Delayed oral transit;Premature spillage;Weak lingual manipulation Oral - Thin Straw -- Oral - Puree Delayed oral transit;Premature spillage;Weak lingual manipulation Oral - Mech Soft -- Oral - Regular -- Oral - Multi-Consistency -- Oral - Pill -- Oral Phase - Comment --  [facility] IP PHARYNGEAL PHASE 09/26/2015 Pharyngeal Phase Impaired Pharyngeal- Pudding Teaspoon -- Pharyngeal -- Pharyngeal- Pudding Cup -- Pharyngeal -- Pharyngeal- Honey Teaspoon -- Pharyngeal -- Pharyngeal- Honey Cup -- Pharyngeal -- Pharyngeal- Nectar Teaspoon -- Pharyngeal -- Pharyngeal- Nectar Cup Delayed swallow initiation-vallecula;Reduced tongue base retraction;Pharyngeal residue - valleculae Pharyngeal -- Pharyngeal- Nectar Straw Delayed swallow initiation-vallecula;Reduced tongue base retraction;Pharyngeal residue - valleculae Pharyngeal -- Pharyngeal- Thin Teaspoon -- Pharyngeal -- Pharyngeal- Thin Cup Delayed swallow initiation-vallecula;Reduced tongue base retraction;Pharyngeal residue - valleculae;Penetration/Apiration after swallow Pharyngeal Material enters airway, remains ABOVE vocal cords and not ejected out Pharyngeal- Thin Straw -- Pharyngeal -- Pharyngeal- Puree Delayed swallow initiation-vallecula;Reduced tongue base retraction;Pharyngeal residue - valleculae Pharyngeal -- Pharyngeal- Mechanical Soft -- Pharyngeal -- Pharyngeal- Regular -- Pharyngeal -- Pharyngeal- Multi-consistency -- Pharyngeal -- Pharyngeal- Pill -- Pharyngeal -- Pharyngeal  Comment --  [facility] IP CERVICAL ESOPHAGEAL PHASE 09/26/2015 Cervical Esophageal Phase (No Data) Pudding Teaspoon -- Pudding Cup -- Honey Teaspoon -- Honey Cup -- Nectar Teaspoon -- Nectar Cup -- Nectar Straw -- Thin Teaspoon -- Thin Cup -- Thin Straw -- Puree -- Mechanical Soft -- Regular -- Multi-consistency -- Pill -- Cervical Esophageal Comment -- [facility] IP GO 09/23/2015 Functional Assessment Tool Used ASHA NOMS and clinical judgment.   Functional Limitations Swallowing Swallow Current Status [phone]) CN Swallow Goal Status [id number]) CK Swallow Discharge Status [phone]) (None) Motor Speech Current Status [phone]) (None) Motor Speech Goal Status [phone]) (None) Motor Speech Goal Status [phone]) (None) Spoken Language Comprehension Current Status [phone]) (None) Spoken Language Comprehension Goal Status [id number]) (None) Spoken Language Comprehension Discharge Status [phone]) (None) Spoken Language Expression Current Status [phone]) (None) Spoken Language Expression Goal Status [phone]) (None) Spoken Language Expression Discharge Status [phone]) (None) Attention Current Status [id number]) (None) Attention Goal Status [id number]) (None) Attention Discharge Status [phone]) (None) Memory Current Status [id number]) (None) Memory Goal Status [id number]) (None) Memory Discharge Status [id number]) (None) Voice Current Status [id number]) (None) Voice Goal Status [id number]) (None) Voice Discharge Status [id number]) (None) Other Speech-Language Pathology Functional Limitation [phone]) (None) Other Speech-Language Pathology Functional Limitation Goal Status [id number]) (None) Other Speech-Language Pathology Functional Limitation Discharge Status [phone]) (None) Germain Osgood, M.A. CCC-SLP [phone] Germain Osgood 09/26/2015, 3:05 PM              Ir Cyndy Freeze Guide Cv Midline Picc Left  09/24/2015  CLINICAL DATA:  Altered mental status, renal failure, needs venous access for infusion therapy EXAM: TUNNELED CENTRAL VENOUS CATHETER PLACEMENT WITH ULTRASOUND AND FLUOROSCOPIC  GUIDANCE TECHNIQUE: The procedure, risks, benefits, and alternatives were explained to the family. Questions regarding the procedure were encouraged and answered. The family understands and consents to the procedure. An appropriate skin site was determined. Region was prepped using maximum barrier technique including cap and mask, sterile gown, sterile gloves, large sterile sheet, and Chlorhexidine as cutaneous antisepsis. The region was infiltrated locally with 1% lidocaine. Left IJ approach was selected because of an indwelling right jugular catheter. Under real-time ultrasound guidance, the left IJ vein was accessed with a 21 gauge micropuncture needle; the needle tip within the vein was confirmed with ultrasound image documentation. 12F dual-lumen cuffed powerPICC tunneled from a righ left t anterior chest wall approach to the dermatotomy site. Needle exchanged over the 018 guidewire for transitional dilator, through which the catheter which had been cut to 28 cm was advanced under intermittent fluoroscopy, positioned with its tip at the cavoatrial junction. Spot chest radiograph confirms good catheter position. No pneumothorax. Catheter was flushed per protocol. Catheter secured externally with O Prolene suture. The left IJ dermatotomy site was closed with Dermabond. COMPLICATIONS: COMPLICATIONS None immediate FLUOROSCOPY TIME:  30 seconds, 3 mGy COMPARISON:  None IMPRESSION: 1. Technically successful placement of tunneled left IJ tunneled dual-lumen power injectable catheter with ultrasound and fluoroscopic guidance. Ready for routine use. Electronically Signed   By: Lucrezia Europe M.D.   On: 09/24/2015 15:16    ASSESSMENT/PLAN:  UTI - start Bactrim DS 1 tab PO BID X 7 days and Florastor 250 mg 1 capsule BID X 10 days    [facility], NP [place]

## 2015-10-16 ENCOUNTER — Non-Acute Institutional Stay (SKILLED_NURSING_FACILITY): Payer: Medicare HMO | Admitting: Adult Health

## 2015-10-16 ENCOUNTER — Encounter: Payer: Self-pay | Admitting: Adult Health

## 2015-10-16 DIAGNOSIS — I10 Essential (primary) hypertension: Secondary | ICD-10-CM

## 2015-10-16 DIAGNOSIS — E43 Unspecified severe protein-calorie malnutrition: Secondary | ICD-10-CM

## 2015-10-16 DIAGNOSIS — I25119 Atherosclerotic heart disease of native coronary artery with unspecified angina pectoris: Secondary | ICD-10-CM | POA: Diagnosis not present

## 2015-10-16 DIAGNOSIS — R5381 Other malaise: Secondary | ICD-10-CM

## 2015-10-16 DIAGNOSIS — Z794 Long term (current) use of insulin: Secondary | ICD-10-CM

## 2015-10-16 DIAGNOSIS — K219 Gastro-esophageal reflux disease without esophagitis: Secondary | ICD-10-CM

## 2015-10-16 DIAGNOSIS — E785 Hyperlipidemia, unspecified: Secondary | ICD-10-CM

## 2015-10-16 DIAGNOSIS — E1122 Type 2 diabetes mellitus with diabetic chronic kidney disease: Secondary | ICD-10-CM

## 2015-10-16 DIAGNOSIS — B372 Candidiasis of skin and nail: Secondary | ICD-10-CM | POA: Diagnosis not present

## 2015-10-16 DIAGNOSIS — J4520 Mild intermittent asthma, uncomplicated: Secondary | ICD-10-CM | POA: Diagnosis not present

## 2015-10-16 DIAGNOSIS — D62 Acute posthemorrhagic anemia: Secondary | ICD-10-CM

## 2015-10-16 DIAGNOSIS — N183 Chronic kidney disease, stage 3 unspecified: Secondary | ICD-10-CM

## 2015-10-16 NOTE — Progress Notes (Addendum)
Patient ID: Monica [name], female   DOB: 12/25/1930, 82 y.o.   MRN: [id number]    DATE:   10/16/15  MRN:  [id number]  BIRTHDAY: 1930-09-09  Facility:  Nursing Home Location:  [place] Room Number: 908-P  LEVEL OF CARE:  SNF [phone])      Contact Information    Name Relation Home Work [place] Son [phone]     Shealyn, Beaubrun [phone]     [name] Daughter [phone]         Code Status History    Date Active Date Inactive Code Status Order ID Comments User Context   09/15/2015  3:24 AM 09/19/2015  3:48 PM Full Code [id number]  Ivor Costa, MD Inpatient       Chief Complaint  Patient presents with  . Discharge Note    HISTORY OF PRESENT ILLNESS:   This is an 82 year old female who is for discharge home with Home health with [place], OT, CNA and Nursing. DME:  Standard wheelchair 18" X 18", cushion, anti-tippers, elevating leg rest.  She has been admitted to [facility] on 10/03/15 from [facility]. She has PMH of hypertension, hyperlipidemia, diabetes mellitus, asthma, GERD, gout, chronic kidney disease-stage III and peptic ulcer disease.  She was having chest pains and had cardiac catheterization on 09/17/15. This revealed severe 3 vessel CAD. She then underwent CABG 4 utilizing LIMA to LAD, SVG to ramus intermediate SVG to OM and SVG to distal RCA. She also underwent an endoscopic saphenous vein harvest from the left and right leg. She was transfused 1 unit of packed RBC for expected postoperative blood loss anemia. Speech swallow evaluation was obtained and showed swallowing dysfunction. She initially was kept nothing by mouth and required placement of a panda feeding tube. She developed some acute mental status changes. Modified barium swallow was completed and it was recommended for the patient to be placed on a modified diet. It was felt her changes were likely due to acute embolic encephalopathy. She  continued to make progress. Her mental status improved and her swallowing dysfunction and proved.  Patient was admitted to this facility for short-term rehabilitation after the patient's recent hospitalization.  Patient has completed SNF rehabilitation and therapy has cleared the patient for discharge.  PAST MEDICAL HISTORY:  Past Medical History  Diagnosis Date  . Peptic ulcer   . Mass   . Essential hypertension   . Hyperlipidemia   . Type 2 diabetes mellitus ([place])   . Asthma   . History of blood transfusion   . CKD (chronic kidney disease), stage III   . Physical deconditioning   . Coronary artery disease involving native coronary artery of native heart without angina pectoris   . GERD (gastroesophageal reflux disease)   . Acute blood loss anemia   . Protein calorie malnutrition ([place])   . Chest wall pain   . Asthma   . Dyspnea   . Encounter for central line care   . Candida infection 10/2015    Bilateral breasts     CURRENT MEDICATIONS: Reviewed    Medication List       This list is accurate as of: 10/16/15  2:33 PM.  Always use your most recent med list.               acetaminophen 325 MG tablet  Commonly known as:  TYLENOL  Take 2 tablets (650 mg total) by mouth every 6 (six) hours  as needed for mild pain.     albuterol 108 (90 Base) MCG/ACT inhaler  Commonly known as:  PROVENTIL HFA;VENTOLIN HFA  Inhale 2 puffs into the lungs every 6 (six) hours as needed for wheezing or shortness of breath.     amLODipine 10 MG tablet  Commonly known as:  NORVASC  Take 10 mg by mouth daily.     aspirin 81 MG EC tablet  Take 1 tablet (81 mg total) by mouth daily.     atorvastatin 40 MG tablet  Commonly known as:  LIPITOR  Take 1 tablet (40 mg total) by mouth daily at 6 PM.     BACTRIM DS PO  Take 1 tablet by mouth 2 (two) times daily. For seven (7) days     Calcium Carb-Cholecalciferol [id number] MG-UNIT Caps  Take 1 tablet by mouth daily.     calcium carbonate 500 MG  chewable tablet  Commonly known as:  TUMS - dosed in mg elemental calcium  Chew 1 tablet by mouth daily as needed for indigestion or heartburn.     diphenhydrAMINE 25 MG tablet  Commonly known as:  BENADRYL  Take 25 mg by mouth as needed for itching or allergies.     ferrous sulfate 325 (65 FE) MG tablet  Take 325 mg by mouth daily with breakfast.     insulin glargine 100 UNIT/ML injection  Commonly known as:  LANTUS  Inject 20 Units into the skin 2 (two) times daily.     levalbuterol 0.63 MG/3ML nebulizer solution  Commonly known as:  XOPENEX  Take 3 mLs (0.63 mg total) by nebulization every 6 (six) hours as needed for wheezing or shortness of breath.     meclizine 12.5 MG tablet  Commonly known as:  ANTIVERT  Take 12.5 mg by mouth 3 (three) times daily as needed for dizziness.     Melatonin 3 MG Tabs  Take 1 tablet by mouth at bedtime.     metoprolol tartrate 25 MG tablet  Commonly known as:  LOPRESSOR  Take 1 tablet (25 mg total) by mouth 2 (two) times daily.     NOVOLOG 100 UNIT/ML injection  Generic drug:  insulin aspart  Inject 0-24 Units into the skin. 0-24 units TID AC and HS as follows: 0-60 = 0 units, hypoglycemic protocol and call MD; 61-119 = 0 units; 120-160 = 2 units; 161-200 =4 units, 201-250 = 8 units, 251-300 = 12 units, 301-350 = 16 units, 351-450 = 20 units, >450 = 24 units and call MD     nystatin powder  Commonly known as:  MYCOSTATIN  Apply topically 2 (two) times daily. 100,000 units/gm powder applied under bilateral breasts BID x3 weeks     PROCEL Powd  Take 2 scoop by mouth 2 (two) times daily.     saccharomyces boulardii 250 MG capsule  Commonly known as:  FLORASTOR  Take 250 mg by mouth 2 (two) times daily. Take for 10 days     traMADol 50 MG tablet  Commonly known as:  ULTRAM  Take 1 tablet (50 mg total) by mouth every 12 (twelve) hours as needed for moderate pain.     Vitamin D-3 1000 units Caps  Take 1 capsule by mouth daily.         No Known Allergies   REVIEW OF SYSTEMS:  GENERAL: no change in appetite, no fatigue, no weight changes, no fever, chills or weakness EYES: Denies change in vision, dry eyes, eye pain, itching or discharge  EARS: Denies change in hearing, ringing in ears, or earache NOSE: Denies nasal congestion or epistaxis MOUTH and THROAT: Denies oral discomfort, gingival pain or bleeding, pain from teeth or hoarseness   RESPIRATORY: no cough, SOB, DOE, wheezing, hemoptysis CARDIAC: no chest pain, or palpitations, +edema GI: no abdominal pain, diarrhea, constipation, heart burn, nausea or vomiting GU: Denies dysuria, frequency, hematuria, incontinence, or discharge PSYCHIATRIC: Denies feeling of depression or anxiety. No report of hallucinations, insomnia, paranoia, or agitation   PHYSICAL EXAMINATION  GENERAL APPEARANCE: Well nourished. In no acute distress. Obese SKIN:  Chest midline incision is dry, no rashes under  bilateral breast HEAD: Normal in size and contour. No evidence of trauma EYES: Lids open and close normally. No blepharitis, entropion or ectropion. PERRL. Conjunctivae are clear and sclerae are white. Lenses are without opacity EARS: Pinnae are normal. Patient hears normal voice tunes of the examiner MOUTH and THROAT: Lips are without lesions. Oral mucosa is moist and without lesions. Tongue is normal in shape, size, and color and without lesions NECK: supple, trachea midline, no neck masses, no thyroid tenderness, no thyromegaly LYMPHATICS: no LAN in the neck, no supraclavicular LAN RESPIRATORY: breathing is even & unlabored, BS CTAB CARDIAC: RRR, no murmur,no extra heart sounds, BLE edema 2+ GI: abdomen soft, normal BS, no masses, no tenderness, no hepatomegaly, no splenomegaly EXTREMITIES:  Able to move X 4 extremities PSYCHIATRIC: Alert and oriented X 3. Affect and behavior are appropriate  LABS/RADIOLOGY: Labs reviewed: Basic Metabolic Panel:  Recent Labs   09/19/15 2130 09/20/15 0400  09/20/15 1630  09/29/15 0425 09/30/15 0445 10/02/15 10/02/15 0430 10/09/15  NA  --  140  < >  --   < > 143 144 140 140 140  K  --  4.3  < >  --   < > 4.0 4.2  --  5.0 4.9  CL  --  107  < >  --   < > 102 102  --  100*  --   CO2  --  22  --   --   < > 31 32  --  32  --   GLUCOSE  --  118*  < >  --   < > 124* 70  --  121*  --   BUN  --  20  < >  --   < > 62* 59* 60* 60* 48*  CREATININE 1.61* 1.98*  < > 2.63*  < > 2.02* 2.00* 2.0* 2.04* 2.2*  CALCIUM  --  7.1*  --   --   < > 8.1* 8.2*  --  8.5*  --   MG 2.1 2.0  --  2.1  --   --   --   --   --   --   < > = values in this interval not displayed. Liver Function Tests:  Recent Labs  09/23/15 0421 09/24/15 0419 09/25/15 0417  AST 24 24 58*  ALT 30 29 44  ALKPHOS 63 50 68  BILITOT 0.4 0.6 0.4  PROT 5.3* 5.7* 5.7*  ALBUMIN 2.0* 2.1* 2.0*    CBC:  Recent Labs  09/28/15 0503 09/29/15 0425 10/02/15 10/02/15 0430 10/09/15  WBC 13.0* 13.8* 13.8 13.8* 9.1  NEUTROABS  --   --   --   --  6  HGB 8.1* 8.4*  --  8.2* 9.8*  HCT 26.0* 25.6*  --  27.1* 32*  MCV 90.3 90.8  --  92.8  --   PLT 474*  489*  --  537* 385   Lipid Panel:  Recent Labs  09/15/15 0343  HDL 35*   Cardiac Enzymes:  Recent Labs  09/15/15 0343 09/15/15 0818 09/15/15 1454  TROPONINI 0.03 <0.03 <0.03    CBG:  Recent Labs  10/02/15 2113 10/03/15 0619 10/03/15 1117  GLUCAP 155* 158* 177*      Dg Chest 2 View  10/01/2015  CLINICAL DATA:  Pleural effusions.  Weakness.  Shortness of breath. EXAM: CHEST  2 VIEW COMPARISON:  09/28/2015. FINDINGS: Left IJ line stable position. Mediastinum hilar structures are normal. Prior CABG. Stable cardiomegaly. Improving pulmonary venous congestion and interstitial prominence consistent with improving congestive heart failure. Low lung volumes with basilar atelectasis. Small stable left pleural effusion. IMPRESSION: 1. Left IJ line stable position. 2. Prior CABG. Cardiomegaly with interim  resolution of pulmonary vascular prominence and improving interstitial prominence suggesting improving congestive heart failure. 3. Low lung volumes with mild bibasilar atelectasis and/or residual infiltrates. Small unchanged left pleural effusion again noted. Electronically Signed   By: Marcello Moores  Register   On: 10/01/2015 07:39   Ct Head Wo Contrast  09/24/2015  CLINICAL DATA:  Confusion, swallowing difficulty, status post CABG 5 days ago EXAM: CT HEAD WITHOUT CONTRAST TECHNIQUE: Contiguous axial images were obtained from the base of the skull through the vertex without intravenous contrast. COMPARISON:  None. FINDINGS: Motion degraded images. No evidence of parenchymal hemorrhage or extra-axial fluid collection. No mass lesion, mass effect, or midline shift. No CT evidence of acute infarction. Old left basal ganglia lacunar infarct. Subcortical white matter and periventricular small vessel ischemic changes. Intracranial atherosclerosis. Global cortical and central atrophy.  No ventriculomegaly. Partial opacification of the right ethmoid sinus. The mastoid air cells are unopacified. Right nasal tube. No evidence of calvarial fracture. IMPRESSION: No evidence of acute intracranial abnormality. Old left basal ganglia lacunar infarct. Atrophy with small vessel ischemic changes. Electronically Signed   By: Julian Hy M.D.   On: 09/24/2015 10:51   Ir Removal Tun Cv Cath W/o Fl  10/08/2015  CLINICAL DATA:  Tunneled IJ PICC placed 10/02/2015 by me for infusion therapy, worked well without complication, no longer required. EXAM: TUNNELED CENTRAL VENOUS CATHETER REMOVAL TECHNIQUE: Overlying skin prepped with chlorhexidine, draped in usual sterile fashion, infiltrated locally with 1% lidocaine. The previously placed left IJ tunneled central venous catheter was dissected free from the underlying soft tissues and removed intact. Hemostasis was achieved. Site covered with a sterile dressing. The patient tolerated the  procedure well. COMPLICATIONS: none IMPRESSION: 1. Technically successful tunneled left IJ central venous catheter removal. Electronically Signed   By: Lucrezia Europe M.D.   On: 10/08/2015 08:45   Ir US Guide Vasc Access Left  09/24/2015  CLINICAL DATA:  Altered mental status, renal failure, needs venous access for infusion therapy EXAM: TUNNELED CENTRAL VENOUS CATHETER PLACEMENT WITH ULTRASOUND AND FLUOROSCOPIC GUIDANCE TECHNIQUE: The procedure, risks, benefits, and alternatives were explained to the family. Questions regarding the procedure were encouraged and answered. The family understands and consents to the procedure. An appropriate skin site was determined. Region was prepped using maximum barrier technique including cap and mask, sterile gown, sterile gloves, large sterile sheet, and Chlorhexidine as cutaneous antisepsis. The region was infiltrated locally with 1% lidocaine. Left IJ approach was selected because of an indwelling right jugular catheter. Under real-time ultrasound guidance, the left IJ vein was accessed with a 21 gauge micropuncture needle; the needle tip within the vein was confirmed with ultrasound image documentation. 26F dual-lumen cuffed  powerPICC tunneled from a righ left t anterior chest wall approach to the dermatotomy site. Needle exchanged over the 018 guidewire for transitional dilator, through which the catheter which had been cut to 28 cm was advanced under intermittent fluoroscopy, positioned with its tip at the cavoatrial junction. Spot chest radiograph confirms good catheter position. No pneumothorax. Catheter was flushed per protocol. Catheter secured externally with O Prolene suture. The left IJ dermatotomy site was closed with Dermabond. COMPLICATIONS: COMPLICATIONS None immediate FLUOROSCOPY TIME:  30 seconds, 3 mGy COMPARISON:  None IMPRESSION: 1. Technically successful placement of tunneled left IJ tunneled dual-lumen power injectable catheter with ultrasound and  fluoroscopic guidance. Ready for routine use. Electronically Signed   By: Lucrezia Europe M.D.   On: 09/24/2015 15:16   Dg Chest Port 1 View  09/28/2015  CLINICAL DATA:  Shortness of Breath EXAM: PORTABLE CHEST 1 VIEW COMPARISON:  09/26/2015 FINDINGS: Cardiac shadow remains enlarged. Postsurgical changes are again seen. A left jugular PICC line is again identified with the catheter tip in the proximal superior vena cava. It appears to have withdrawn slightly from the prior exam. Persistent left basilar infiltrate and effusion are noted. Small right-sided pleural effusion remains. No other focal abnormality is noted. IMPRESSION: Stable bibasilar changes. Electronically Signed   By: Inez Catalina M.D.   On: 09/28/2015 07:49   Dg Chest Port 1 View  09/26/2015  CLINICAL DATA:  CHF, left pleural effusion. EXAM: PORTABLE CHEST 1 VIEW COMPARISON:  Portable chest x-ray of September 25, 2015 FINDINGS: The lungs are adequately inflated. There is persistent left lower lobe atelectasis or pneumonia. There are small bilateral pleural effusions greater on the left than on the right. The cardiac silhouette remains enlarged. The pulmonary vascularity remains engorged. The pulmonary interstitial markings are slightly less conspicuous today. The patient has undergone previous CABG. The left internal jugular venous catheter tip projects over the proximal SVC. IMPRESSION: Slight interval improvement in pulmonary interstitial edema. There remains left basilar atelectasis and mild CHF with small bilateral pleural effusions. Electronically Signed   By: David  Martinique M.D.   On: 09/26/2015 07:53   Dg Chest Port 1 View  09/25/2015  CLINICAL DATA:  Shortness of breath, history of asthma, Coronary artery disease, previous CABG, diabetes. EXAM: PORTABLE CHEST 1 VIEW COMPARISON:  Portable chest x-ray of September 24, 2015 FINDINGS: The lungs are borderline hypoinflated. The pulmonary interstitial markings remain increased. The left hemidiaphragm  remains obscured. The cardiac silhouette remains enlarged and the pulmonary vascularity engorged. The feeding tube tip projects below the inferior margin of the image. The left internal jugular venous catheter tip projects over the proximal SVC. The bony thorax exhibits no acute abnormality. IMPRESSION: CHF with pulmonary interstitial edema with stable left basilar atelectasis and small effusion. No significant change since yesterday's study. Electronically Signed   By: David  Martinique M.D.   On: 09/25/2015 08:01   Dg Chest Port 1 View  09/24/2015  CLINICAL DATA:  Pulmonary atelectasis.  Shortness of breath today. EXAM: PORTABLE CHEST 1 VIEW COMPARISON:  09/23/2015 FINDINGS: Right internal jugular venous access sheath remains in place. There has been previous median sternotomy and CABG. There continues to be left lower lobe collapse with a small amount of pleural fluid on the left. Mild volume loss at the right base. Possible pulmonary venous hypertension. IMPRESSION: No change.  Persistent left lower lobe collapse and left effusion. Electronically Signed   By: Nelson Chimes M.D.   On: 09/24/2015 07:38   Dg Chest Port 1  View  09/23/2015  CLINICAL DATA:  Atelectasis EXAM: PORTABLE CHEST 1 VIEW COMPARISON:  Chest radiograph from one day prior. FINDINGS: Right internal jugular central venous sheath terminates in the right brachiocephalic vein. Sternotomy wires appear aligned and intact. Stable cardiomediastinal silhouette with mild-to-moderate cardiomegaly. No pneumothorax. Stable small left pleural effusion. Mild-to-moderate pulmonary edema is not appreciably changed. Left greater than right lung base opacities, slightly decreased on the right and stable on the left. IMPRESSION: 1. Stable mild-to-moderate congestive heart failure. 2. Stable small left pleural effusion. 3. Bibasilar lung opacities, left greater than right, slightly improved on the right and stable on the left, favor atelectasis. Electronically  Signed   By: Ilona Sorrel M.D.   On: 09/23/2015 08:09   Dg Chest Port 1 View  09/22/2015  CLINICAL DATA:  Chest tubes removed per RN. EXAM: PORTABLE CHEST 1 VIEW COMPARISON:  Chest radiograph from one day prior. FINDINGS: Sternotomy wires appear aligned and intact. Right internal jugular central venous sheath terminates in the upper third of the superior vena cava. Slightly low lung volumes. Stable cardiomediastinal silhouette with mild cardiomegaly. No pneumothorax. Stable small left pleural effusion. Mild-to-moderate pulmonary edema appears slightly worsened. Bilateral lower lobe opacities appear unchanged, favor atelectasis. IMPRESSION: 1. Mild-to-moderate congestive heart failure, slightly worsened. 2. Stable small left pleural effusion. 3. Stable bilateral lower lobe opacities, favor atelectasis. Electronically Signed   By: Ilona Sorrel M.D.   On: 09/22/2015 10:08   Dg Chest Port 1 View  09/21/2015  CLINICAL DATA:  Hypertension.  Diabetes.  Post CABG. EXAM: PORTABLE CHEST 1 VIEW COMPARISON:  09/20/2015; 09/19/2015; 09/14/2015 FINDINGS: Grossly unchanged enlarged cardiac silhouette and mediastinal contours given patient rotation. Interval removal of right jugular approach PA catheter with remaining vascular sheath tip overlying the mid SVC. No pneumothorax. Mild pulmonary venous congestion without frank evidence of edema. There is a minimal amount of pleural parenchymal thickening about the right minor fissure. Trace bilateral effusions are not excluded. Bibasilar heterogeneous/consolidative opacities are unchanged. No new focal airspace opacities. Unchanged bones. IMPRESSION: 1. Remaining support apparatus as above.  No pneumothorax. 2. Pulmonary is congestion without frank evidence of edema. 3. Unchanged bibasilar opacities, left greater than right, likely atelectasis. Electronically Signed   By: Sandi Mariscal M.D.   On: 09/21/2015 07:47   Dg Chest Port 1 View  09/20/2015  CLINICAL DATA:  Post CABG,  hypertension, type II diabetes mellitus, asthma EXAM: PORTABLE CHEST 1 VIEW COMPARISON:  Portable exam 0628 hours compared to 09/19/2015 FINDINGS: Swan-Ganz catheter tip projects over RIGHT pulmonary artery. LEFT thoracostomy tube stable. Enlargement of cardiac silhouette post CABG. Atherosclerotic calcification aorta. Bibasilar atelectasis. Significant portions of the upper lobes and lung apices obscured by patient's head. No gross pleural effusion. Unable to assess for pneumothorax due to obscuration of the upper lobes. IMPRESSION: Bibasilar atelectasis. Enlargement of cardiac silhouette post CABG. Electronically Signed   By: Lavonia Dana M.D.   On: 09/20/2015 08:06   Dg Chest Port 1 View  09/19/2015  CLINICAL DATA:  Coronary artery disease.  Status post CABG. EXAM: PORTABLE CHEST 1 VIEW COMPARISON:  09/14/2015 FINDINGS: Endotracheal tube and NG tube and chest tubes appear in good position. Swan-Ganz catheter tip is in the right main pulmonary artery. The lungs are clear. Very tiny left apical pneumothorax. Heart size and vascularity are normal. Aortic atherosclerosis. IMPRESSION: Very tiny left apical pneumothorax. Tubes and lines appear in good position. Electronically Signed   By: Lorriane Shire M.D.   On: 09/19/2015 16:39   Dg  Abd Portable 1v  09/24/2015  CLINICAL DATA:  Feeding tube placement EXAM: PORTABLE ABDOMEN - 1 VIEW COMPARISON:  Nine/ 19/15 FINDINGS: There is normal small bowel gas pattern. The patient is status post CABG. There is and she feeding tube with tip in and distal stomach. IMPRESSION: NG feeding tube with tip in distal stomach. Electronically Signed   By: Lahoma Crocker M.D.   On: 09/24/2015 11:12   Dg Swallowing Func-speech Pathology  09/26/2015  Objective Swallowing Evaluation: Type of Study: MBS-Modified Barium Swallow Study Patient Details Name: Shaleah Degiorgio MRN: [id number] Date of Birth: [birth year] Today's Date: 09/26/2015 Time: SLP Start Time (ACUTE ONLY): 1405-SLP Stop Time (ACUTE  ONLY): 1422 SLP Time Calculation (min) (ACUTE ONLY): 17 min Past Medical History: Past Medical History Diagnosis Date . Peptic ulcer  . Mass  . Essential hypertension  . Hyperlipidemia  . Type 2 diabetes mellitus ([place])  . Asthma  . History of blood transfusion  . CKD (chronic kidney disease), stage III  Past Surgical History: Past Surgical History Procedure Laterality Date . Abdominal hysterectomy   . Colon surgery   . Cardiac catheterization N/A 09/17/2015   Procedure: Left Heart Cath and Coronary Angiography;  Surgeon: Jettie Booze, MD;  Location: [facility];  Service: Cardiovascular;  Laterality: N/A; . Coronary artery bypass graft N/A 09/19/2015   Procedure: CORONARY ARTERY BYPASS GRAFTING (CABG) TIMES FOUR USING BILATARAL SAPHENOUS VEIN GRAFTS AND LEFT INTERNAL MAMMARY ARTERY;  Surgeon: [name], MD;  Location: [facility];  Service: Open Heart Surgery;  Laterality: N/A; . Tee without cardioversion N/A 09/19/2015   Procedure: TRANSESOPHAGEAL ECHOCARDIOGRAM (TEE);  Surgeon: [name], MD;  Location: [facility];  Service: Open Heart Surgery;  Laterality: N/A; HPI: Khadra Giuffre is a 82 y.o. female with PMH of hypertension, hyperlipidemia, diabetes mellitus, asthma, GERD, gout, chronic kidney disease-stage III, peptic ulcer disease, who presents with chest pain.  The patient is s/p CABG x 4 and per nursing has been coughing with intake.  Per nursing the patient was relatively independent prior to admission with not reported swallowing issues.  Current chest xray is showing stable mild-to-moderate congestive heart failure, stable small left pleural effusion and bibasilar lung opacities, left greater than right, slightly Subjective: pt alert, daughter says she is clearing her throat throughout the day since surgery Assessment / Plan / Recommendation CHL IP CLINICAL IMPRESSIONS 09/26/2015 Therapy Diagnosis Moderate oral phase dysphagia;Mild pharyngeal phase dysphagia;Mild oral phase dysphagia Clinical  Impression Pt has a mild-moderate oropharyngeal dysphagia, although with overall visibility reduced due to shoulders/body habitus limiting view. She does have slow oral transit with premature spillage to the valleculae. Mild residue remained in the valleculae post-swallow with trace, shallow penetration observed x1 after the swallow with thin liquids. Pt cleared with cued throat clear. Although it was difficult to visualize the upper esophagus, there did appear to be backflow to the level of the UES x1. Throat clearing occurred throughout the duration of testing but was not correlated with airway compromise. Recommend to continue with current textures. Impact on safety and function Mild aspiration risk;Moderate aspiration risk   CHL IP TREATMENT RECOMMENDATION 09/26/2015 Treatment Recommendations Therapy as outlined in treatment plan below   Prognosis 09/26/2015 Prognosis for Safe Diet Advancement Good Barriers to Reach Goals Cognitive deficits Barriers/Prognosis Comment -- [facility] IP DIET RECOMMENDATION 09/26/2015 SLP Diet Recommendations Dysphagia 1 (Puree) solids;Nectar thick liquid Liquid Administration via Cup;Straw Medication Administration Crushed with puree Compensations Minimize environmental distractions;Slow rate;Small sips/bites Postural Changes Remain semi-upright after  after feeds/meals (Comment);Seated upright at 90 degrees   [facility] IP OTHER RECOMMENDATIONS 09/26/2015 Recommended Consults -- Oral Care Recommendations Oral care BID Other Recommendations Order thickener from pharmacy;Prohibited food (jello, ice cream, thin soups);Remove water pitcher   [facility] IP FOLLOW UP RECOMMENDATIONS 09/26/2015 Follow up Recommendations Skilled Nursing facility   [facility] IP FREQUENCY AND DURATION 09/26/2015 Speech Therapy Frequency (ACUTE ONLY) min 2x/week Treatment Duration 2 weeks      [facility] IP ORAL PHASE 09/26/2015 Oral Phase Impaired Oral - Pudding Teaspoon -- Oral - Pudding Cup -- Oral - Honey Teaspoon -- Oral - Honey Cup -- Oral -  Nectar Teaspoon -- Oral - Nectar Cup Delayed oral transit;Premature spillage;Weak lingual manipulation Oral - Nectar Straw Delayed oral transit;Premature spillage;Weak lingual manipulation Oral - Thin Teaspoon -- Oral - Thin Cup Delayed oral transit;Premature spillage;Weak lingual manipulation Oral - Thin Straw -- Oral - Puree Delayed oral transit;Premature spillage;Weak lingual manipulation Oral - Mech Soft -- Oral - Regular -- Oral - Multi-Consistency -- Oral - Pill -- Oral Phase - Comment --  [facility] IP PHARYNGEAL PHASE 09/26/2015 Pharyngeal Phase Impaired Pharyngeal- Pudding Teaspoon -- Pharyngeal -- Pharyngeal- Pudding Cup -- Pharyngeal -- Pharyngeal- Honey Teaspoon -- Pharyngeal -- Pharyngeal- Honey Cup -- Pharyngeal -- Pharyngeal- Nectar Teaspoon -- Pharyngeal -- Pharyngeal- Nectar Cup Delayed swallow initiation-vallecula;Reduced tongue base retraction;Pharyngeal residue - valleculae Pharyngeal -- Pharyngeal- Nectar Straw Delayed swallow initiation-vallecula;Reduced tongue base retraction;Pharyngeal residue - valleculae Pharyngeal -- Pharyngeal- Thin Teaspoon -- Pharyngeal -- Pharyngeal- Thin Cup Delayed swallow initiation-vallecula;Reduced tongue base retraction;Pharyngeal residue - valleculae;Penetration/Apiration after swallow Pharyngeal Material enters airway, remains ABOVE vocal cords and not ejected out Pharyngeal- Thin Straw -- Pharyngeal -- Pharyngeal- Puree Delayed swallow initiation-vallecula;Reduced tongue base retraction;Pharyngeal residue - valleculae Pharyngeal -- Pharyngeal- Mechanical Soft -- Pharyngeal -- Pharyngeal- Regular -- Pharyngeal -- Pharyngeal- Multi-consistency -- Pharyngeal -- Pharyngeal- Pill -- Pharyngeal -- Pharyngeal Comment --  [facility] IP CERVICAL ESOPHAGEAL PHASE 09/26/2015 Cervical Esophageal Phase (No Data) Pudding Teaspoon -- Pudding Cup -- Honey Teaspoon -- Honey Cup -- Nectar Teaspoon -- Nectar Cup -- Nectar Straw -- Thin Teaspoon -- Thin Cup -- Thin Straw -- Puree -- Mechanical  Soft -- Regular -- Multi-consistency -- Pill -- Cervical Esophageal Comment -- [facility] IP GO 09/23/2015 Functional Assessment Tool Used ASHA NOMS and clinical judgment.   Functional Limitations Swallowing Swallow Current Status [phone]) CN Swallow Goal Status [id number]) CK Swallow Discharge Status [phone]) (None) Motor Speech Current Status [phone]) (None) Motor Speech Goal Status [phone]) (None) Motor Speech Goal Status [phone]) (None) Spoken Language Comprehension Current Status [phone]) (None) Spoken Language Comprehension Goal Status [id number]) (None) Spoken Language Comprehension Discharge Status [phone]) (None) Spoken Language Expression Current Status [phone]) (None) Spoken Language Expression Goal Status [phone]) (None) Spoken Language Expression Discharge Status [phone]) (None) Attention Current Status [id number]) (None) Attention Goal Status [id number]) (None) Attention Discharge Status [phone]) (None) Memory Current Status [id number]) (None) Memory Goal Status [id number]) (None) Memory Discharge Status [id number]) (None) Voice Current Status [id number]) (None) Voice Goal Status [id number]) (None) Voice Discharge Status [id number]) (None) Other Speech-Language Pathology Functional Limitation [phone]) (None) Other Speech-Language Pathology Functional Limitation Goal Status [id number]) (None) Other Speech-Language Pathology Functional Limitation Discharge Status [phone]) (None) Germain Osgood, M.A. CCC-SLP [phone] Germain Osgood 09/26/2015, 3:05 PM              Ir Cyndy Freeze Guide Cv Midline Picc Left  09/24/2015  CLINICAL DATA:  Altered mental status, renal failure, needs venous access for infusion therapy EXAM: TUNNELED CENTRAL VENOUS CATHETER PLACEMENT WITH ULTRASOUND AND FLUOROSCOPIC  GUIDANCE TECHNIQUE: The procedure, risks, benefits, and alternatives were explained to the family. Questions regarding the procedure were encouraged and answered. The family understands and consents to the procedure. An appropriate skin site was determined. Region was  prepped using maximum barrier technique including cap and mask, sterile gown, sterile gloves, large sterile sheet, and Chlorhexidine as cutaneous antisepsis. The region was infiltrated locally with 1% lidocaine. Left IJ approach was selected because of an indwelling right jugular catheter. Under real-time ultrasound guidance, the left IJ vein was accessed with a 21 gauge micropuncture needle; the needle tip within the vein was confirmed with ultrasound image documentation. 98F dual-lumen cuffed powerPICC tunneled from a righ left t anterior chest wall approach to the dermatotomy site. Needle exchanged over the 018 guidewire for transitional dilator, through which the catheter which had been cut to 28 cm was advanced under intermittent fluoroscopy, positioned with its tip at the cavoatrial junction. Spot chest radiograph confirms good catheter position. No pneumothorax. Catheter was flushed per protocol. Catheter secured externally with O Prolene suture. The left IJ dermatotomy site was closed with Dermabond. COMPLICATIONS: COMPLICATIONS None immediate FLUOROSCOPY TIME:  30 seconds, 3 mGy COMPARISON:  None IMPRESSION: 1. Technically successful placement of tunneled left IJ tunneled dual-lumen power injectable catheter with ultrasound and fluoroscopic guidance. Ready for routine use. Electronically Signed   By: Lucrezia Europe M.D.   On: 09/24/2015 15:16    ASSESSMENT/PLAN:  Physical deconditioning - for home health PT, OT, CNA and Nursing  CAD S/P CABG X 4 - continue acetaminophen 325 mg 2 tabs = 650 mg by mouth every 6 hours when necessary and tramadol 50 mg 1 tab by mouth every 12 hours when necessary for pain; aspirin 81 mg daily and metoprolol 25 mg 1 tab by mouth twice a day  Hypertension - continue amlodipine 10 mg 1 tab by mouth daily and Lasix was discontinued due to elevated creatinine 2.21  Anemia, acute blood loss - hemoglobin 8.2; continue iron 325 mg 1 tab by mouth daily; re-check hgb 9.8, better    Diabetes mellitus with chronic kidney disease - continue Lantus 20 units subcutaneous twice a day and NovoLog sliding scale 3 times a day and at bedtime; hgbA1c 7.6  Hyperlipidemia - continue atorvastatin 40 mg 1 tab by mouth every 6 p.m.  Asthma - stable; continue Xopenex neb every 6 hours when necessary and albuterol 108 g/ACT inhaler 2 puffs into the lungs every 6 hours when necessary  Chronic kidney disease, stage III - creatinine 2.04; re-check creatinine 2.21, for repeat BMP  Protein calorie malnutrition, severe - albumin 2.0; continue Procel 2 scoops by mouth twice a day   Insomnia - continue melatonin 3 mg 1 tab by mouth daily at bedtime  Candidal skin infection -  Continue to apply Nystatin powder 100,000 units/gm under bilateral breast BID X 2 weeks      I have filled out patient's discharge paperwork and written prescriptions.  Patient will receive home health PT, OT, ST, nursing and CNA.  DME provided:  Standard wheelchair 18" X 18", cushion, anti-tippers, elevating leg rest  Total discharge time: Greater than 30 minutes  Discharge time involved coordination of the discharge process with social worker, nursing staff and therapy department. Medical justification for home health services/DME verified.     [facility], NP [employer] [phone]

## 2015-10-18 ENCOUNTER — Other Ambulatory Visit: Payer: Self-pay | Admitting: Nurse Practitioner

## 2015-10-19 ENCOUNTER — Telehealth: Payer: Self-pay | Admitting: *Deleted

## 2015-10-19 ENCOUNTER — Ambulatory Visit
Admission: RE | Admit: 2015-10-19 | Discharge: 2015-10-19 | Disposition: A | Discharge status: Home / Self Care | Payer: Medicare HMO | Source: Ambulatory Visit | Attending: Physician Assistant | Admitting: Physician Assistant

## 2015-10-19 ENCOUNTER — Telehealth: Payer: Self-pay | Admitting: Physician Assistant

## 2015-10-19 ENCOUNTER — Encounter: Payer: Self-pay | Admitting: Physician Assistant

## 2015-10-19 ENCOUNTER — Ambulatory Visit (INDEPENDENT_AMBULATORY_CARE_PROVIDER_SITE_OTHER): Payer: Medicare HMO | Admitting: Physician Assistant

## 2015-10-19 ENCOUNTER — Other Ambulatory Visit: Payer: Self-pay | Admitting: Physician Assistant

## 2015-10-19 VITALS — BP 140/60 | HR 100 | Ht 62.0 in | Wt 180.4 lb

## 2015-10-19 DIAGNOSIS — I2510 Atherosclerotic heart disease of native coronary artery without angina pectoris: Secondary | ICD-10-CM | POA: Diagnosis not present

## 2015-10-19 DIAGNOSIS — R079 Chest pain, unspecified: Secondary | ICD-10-CM

## 2015-10-19 DIAGNOSIS — I5033 Acute on chronic diastolic (congestive) heart failure: Secondary | ICD-10-CM | POA: Diagnosis not present

## 2015-10-19 DIAGNOSIS — E785 Hyperlipidemia, unspecified: Secondary | ICD-10-CM | POA: Diagnosis not present

## 2015-10-19 DIAGNOSIS — N183 Chronic kidney disease, stage 3 unspecified: Secondary | ICD-10-CM

## 2015-10-19 DIAGNOSIS — R0602 Shortness of breath: Secondary | ICD-10-CM | POA: Diagnosis not present

## 2015-10-19 DIAGNOSIS — I10 Essential (primary) hypertension: Secondary | ICD-10-CM | POA: Diagnosis not present

## 2015-10-19 DIAGNOSIS — J90 Pleural effusion, not elsewhere classified: Secondary | ICD-10-CM | POA: Diagnosis not present

## 2015-10-19 DIAGNOSIS — E1122 Type 2 diabetes mellitus with diabetic chronic kidney disease: Secondary | ICD-10-CM

## 2015-10-19 DIAGNOSIS — J9811 Atelectasis: Secondary | ICD-10-CM | POA: Diagnosis not present

## 2015-10-19 DIAGNOSIS — E119 Type 2 diabetes mellitus without complications: Secondary | ICD-10-CM | POA: Diagnosis not present

## 2015-10-19 DIAGNOSIS — Z794 Long term (current) use of insulin: Secondary | ICD-10-CM

## 2015-10-19 DIAGNOSIS — R000 Tachycardia, unspecified: Secondary | ICD-10-CM | POA: Diagnosis not present

## 2015-10-19 DIAGNOSIS — R791 Abnormal coagulation profile: Secondary | ICD-10-CM | POA: Diagnosis not present

## 2015-10-19 DIAGNOSIS — M7989 Other specified soft tissue disorders: Secondary | ICD-10-CM | POA: Diagnosis not present

## 2015-10-19 LAB — BASIC METABOLIC PANEL
BUN: 35 mg/dL — AB (ref 7–25)
CALCIUM: 8.6 mg/dL (ref 8.6–10.4)
CO2: 25 mmol/L (ref 20–31)
Chloride: 102 mmol/L (ref 98–110)
Creat: 2.52 mg/dL — ABNORMAL HIGH (ref 0.60–0.88)
GLUCOSE: 92 mg/dL (ref 65–99)
Potassium: 5.2 mmol/L (ref 3.5–5.3)
Sodium: 137 mmol/L (ref 135–146)

## 2015-10-19 LAB — D-DIMER, QUANTITATIVE: D-Dimer, Quant: 3.73 ug/mL-FEU — ABNORMAL HIGH (ref 0.00–0.48)

## 2015-10-19 MED ORDER — FUROSEMIDE 20 MG PO TABS: 20.0000 mg | ORAL_TABLET | ORAL | Status: DC

## 2015-10-19 NOTE — Telephone Encounter (Signed)
D-Dimer 3.73

## 2015-10-19 NOTE — Patient Instructions (Addendum)
Medication Instructions:  1. START LASIX 40 MG DAILY (2 TABS DAILY) TODAY, 4/15, 4/16; STARTING ON 4/17 YOU WILL BE ON LASIX 20 MG DAILY (1 TAB DAILY) Labwork: 1. TODAY BMET, D-DIMER 2. BMET IN 1 WEEK Testing/Procedures: A chest x-ray takes a picture of the organs and structures inside the chest, including the heart, lungs, and blood vessels. This test can show several things, including, whether the heart is enlarges; whether fluid is building up in the lungs; and whether pacemaker / defibrillator leads are still in place. THIS IS TO BE DONE TODAY AT [place] [facility] Follow-Up: [place], [facility] 10/26/15 @ 11:10  Any Other Special Instructions Will Be Listed Below (If Applicable). If you need a refill on your cardiac medications before your next appointment, please call your pharmacy.

## 2015-10-19 NOTE — Telephone Encounter (Signed)
Patient's daughter called to see if patient would be receiving oxygen today by 5pm.  I talked with Richardson Dopp, he stated we have to get the home health agency, fill out paper work, document everything in Epic, then fax off the papers to the chosen agency. The time frame is an undetermined time frame due to each area having certain procedures and forms to complete.  I let the daughter know. And she said thank you for explaining.  She states they are going with [place], possibly [place]. Insurance has not named exact city as of yet.  I will route message to both [place] and Jonni [place].

## 2015-10-19 NOTE — Telephone Encounter (Signed)
Julaine Hua and Richardson Dopp aware.  Lab being evaluated by Dr Curt Bears.

## 2015-10-19 NOTE — Addendum Note (Signed)
Addended by: Michae Kava on: 10/19/2015 02:21 PM   Modules accepted: Orders

## 2015-10-19 NOTE — Telephone Encounter (Signed)
[facility] calling to give urgent lab

## 2015-10-19 NOTE — Telephone Encounter (Signed)
Pt asked to put ok to s/w daughter [name] and Harriett on chart. Lavern aware of CXR results and findings. She is aware of recommendations and to monitor weight of pt and call over the wknd if wt up 2-3 # x 1 day, increased SOB, or edema. Lavern verbalized understanding to plan of care for the pt and all instructions.

## 2015-10-19 NOTE — Telephone Encounter (Signed)
I s/w Aaron Edelman in the [place] at [facility] in regards to pt's dimer elevated 3.73. Per Richardson Dopp, PAC ov notday if elevated will need to have VQ Scan today as well as LE Venous Duplex . Aaron Edelman advised to send pt to ED now and will have ED dr give orders for VQ Scan and LE Venous Duplex. I am faxing over as well OV note from today and D-dimer lab results from today. I also s/w pt's daughter [name] who has been made aware to bring pt to ED now so that these test may be done. All involved verbalized understanding to plan of care for the pt. I am faxing to Aaron Edelman in the [place] at [place].

## 2015-10-19 NOTE — Telephone Encounter (Signed)
Please send order to [facility] for O2. Richardson Dopp, PA-C   10/19/2015 1:53 PM

## 2015-10-19 NOTE — Progress Notes (Addendum)
Cardiology Office Note:    Date:  10/19/2015   ID:  Monica [name], DOB 11/18/1930, MRN [id number]  PCP:  Benito Mccreedy, MD  Cardiologist:  Dr. Ena Dawley   Electrophysiologist:  N/a Nephrologist: Dr. Joesph July [facility])  Referring MD: Benito Mccreedy, MD   Chief Complaint  Patient presents with  . Hospitalization Follow-up    s/p CABG    History of Present Illness:     Monica [name] is a 82 y.o. female with a hx of HTN, HL, diabetes, asthma, GERD, gout, CKD, PUD. Admitted 3/10-3/29 with unstable angina. Cardiac enzymes remained normal. Cardiac catheterization demonstrated severe 3 vessel CAD. Echocardiogram demonstrated normal LV function. She was seen by Dr. Servando Snare for cardiac surgery and underwent CABG with LIMA-LAD, SVG-intermediate, SVG-OM, SVG-distal RCA. Postoperative course was complicated by blood loss anemia requiring transfusion with PRBCs. There was some concern for aspiration due to difficulty with swallowing and she had a Panda feeding tube for some time. She had some mental status changes as well. CVA could not be ruled out but symptoms were overall felt to be due to metabolic encephalopathy. Mental status changes improved. She had some elevation in her creatinine and was followed by nephrology. She maintained sinus rhythm. She was DC to [facility].    Returns for FU. Here today with her son and daughter-in-law. She was discharged from SNF 2 days ago. She was on O2 prior to discharge. However, her oxygen saturation improved and this was discontinued. Over the last several days, she has noted worsening shortness of breath with exertion. She is NYHA 3. She notes orthopnea and PND. She also notes increasing LE edema. She denies cough but has been wheezing. She is not weighing herself at home. Of note, her nephrologist previously had her on Lasix 20 mg every other day. Upon review of her chart, it appears that her Lasix was held at some point due to creatinine increased  to 2.4. She does have some left-sided chest discomfort. It is fairly constant. It's been present since she was in the hospital.   Past Medical History  Diagnosis Date  . Peptic ulcer   . Mass   . Essential hypertension   . Hyperlipidemia   . Type 2 diabetes mellitus ([place])   . Asthma   . History of blood transfusion   . CKD (chronic kidney disease), stage III   . Physical deconditioning   . Coronary artery disease involving native coronary artery of native heart without angina pectoris   . GERD (gastroesophageal reflux disease)   . Acute blood loss anemia   . Protein calorie malnutrition ([place])   . Chest wall pain   . Asthma   . Dyspnea   . Encounter for central line care   . Candida infection 10/2015    Bilateral breasts    Past Surgical History  Procedure Laterality Date  . Abdominal hysterectomy    . Colon surgery    . Cardiac catheterization N/A 09/17/2015    Procedure: Left Heart Cath and Coronary Angiography;  Surgeon: Jettie Booze, MD;  Location: [facility];  Service: Cardiovascular;  Laterality: N/A;  . Coronary artery bypass graft N/A 09/19/2015    Procedure: CORONARY ARTERY BYPASS GRAFTING (CABG) TIMES FOUR USING BILATARAL SAPHENOUS VEIN GRAFTS AND LEFT INTERNAL MAMMARY ARTERY;  Surgeon: [name], MD;  Location: [facility];  Service: Open Heart Surgery;  Laterality: N/A;  . Tee without cardioversion N/A 09/19/2015    Procedure: TRANSESOPHAGEAL ECHOCARDIOGRAM (TEE);  Surgeon:  [name], MD;  Location: [facility];  Service: Open Heart Surgery;  Laterality: N/A;    Current Medications: Outpatient Prescriptions Prior to Visit  Medication Sig Dispense Refill  . albuterol (PROVENTIL HFA;VENTOLIN HFA) 108 (90 Base) MCG/ACT inhaler Inhale 2 puffs into the lungs every 6 (six) hours as needed for wheezing or shortness of breath.    Marland Kitchen amLODipine (NORVASC) 10 MG tablet Take 10 mg by mouth daily.    Marland Kitchen aspirin EC 81 MG EC tablet Take 1 tablet (81 mg total) by  mouth daily.    Marland Kitchen atorvastatin (LIPITOR) 40 MG tablet Take 1 tablet (40 mg total) by mouth daily at 6 PM.    . Calcium Carb-Cholecalciferol [phone] MG-UNIT CAPS Take 1 tablet by mouth daily.    . calcium carbonate (TUMS - DOSED IN MG ELEMENTAL CALCIUM) 500 MG chewable tablet Chew 1 tablet by mouth daily as needed for indigestion or heartburn.    . Cholecalciferol (VITAMIN D-3) 1000 units CAPS Take 1 capsule by mouth daily.    . diphenhydrAMINE (BENADRYL) 25 MG tablet Take 25 mg by mouth as needed for itching or allergies.     . ferrous sulfate 325 (65 FE) MG tablet Take 325 mg by mouth daily with breakfast.    . insulin aspart (NOVOLOG) 100 UNIT/ML injection Inject 0-24 Units into the skin. 0-24 units TID AC and HS as follows: 0-60 = 0 units, hypoglycemic protocol and call MD; 61-119 = 0 units; 120-160 = 2 units; 161-200 =4 units, 201-250 = 8 units, 251-300 = 12 units, 301-350 = 16 units, 351-450 = 20 units, >450 = 24 units and call MD    . insulin glargine (LANTUS) 100 UNIT/ML injection Inject 20 Units into the skin 2 (two) times daily.     . metoprolol tartrate (LOPRESSOR) 25 MG tablet Take 1 tablet (25 mg total) by mouth 2 (two) times daily.    Marland Kitchen nystatin (MYCOSTATIN) powder Apply topically 2 (two) times daily. 100,000 units/gm powder applied under bilateral breasts BID x3 weeks    . acetaminophen (TYLENOL) 325 MG tablet Take 2 tablets (650 mg total) by mouth every 6 (six) hours as needed for mild pain. (Patient not taking: Reported on 10/19/2015)    . levalbuterol (XOPENEX) 0.63 MG/3ML nebulizer solution Take 3 mLs (0.63 mg total) by nebulization every 6 (six) hours as needed for wheezing or shortness of breath. (Patient not taking: Reported on 10/19/2015) 3 mL 12  . meclizine (ANTIVERT) 12.5 MG tablet Take 12.5 mg by mouth 3 (three) times daily as needed for dizziness.    . Melatonin 3 MG TABS Take 1 tablet by mouth at bedtime.    . Protein (PROCEL) POWD Take 2 scoop by mouth 2 (two) times daily.     Marland Kitchen saccharomyces boulardii (FLORASTOR) 250 MG capsule Take 250 mg by mouth 2 (two) times daily. Take for 10 days    . Sulfamethoxazole-Trimethoprim (BACTRIM DS PO) Take 1 tablet by mouth 2 (two) times daily. For seven (7) days    . traMADol (ULTRAM) 50 MG tablet Take 1 tablet (50 mg total) by mouth every 12 (twelve) hours as needed for moderate pain. 30 tablet 0   No facility-administered medications prior to visit.     Allergies:   Review of patient's allergies indicates no known allergies.   Social History   Social History  . Marital Status: Widowed    Spouse Name: N/A  . Number of Children: N/A  . Years of Education: N/A  Occupational History  . Retired    Social History Main Topics  . Smoking status: Never Smoker   . Smokeless tobacco: Former Systems developer    Types: Snuff  . Alcohol Use: No  . Drug Use: No  . Sexual Activity: Not Asked   Other Topics Concern  . None   Social History Narrative     Family History:  The patient's family history includes Heart attack in her father.   ROS:   Please see the history of present illness.    Review of Systems  Cardiovascular: Positive for dyspnea on exertion and leg swelling.  Respiratory: Positive for shortness of breath and wheezing.   Musculoskeletal: Positive for joint pain.  Neurological: Positive for loss of balance.  Psychiatric/Behavioral: The patient is nervous/anxious.    All other systems reviewed and are negative.   Physical Exam:    VS:  BP 140/60 mmHg  Pulse 100  Ht 5\' 2"  (1.575 m)  Wt 180 lb 6.4 oz (81.829 kg)  BMI 32.99 kg/m2   GEN: Well nourished, well developed, in no acute distress HEENT: normal Neck: no JVD, no masses Cardiac: Normal S1/S2, RRR; no murmurs, rubs, or gallops, 1-2+ bilateral LE edema to the knees Respiratory:  Decreased breath sounds bilaterally (R >L), crackles at the bases, no wheezing GI: soft, nontender, nondistended MS: no deformity or atrophy Skin: warm and dry Neuro: No  focal deficits  Psych: Alert and oriented x 3, normal affect  Wt Readings from Last 3 Encounters:  10/19/15 180 lb 6.4 oz (81.829 kg)  10/16/15 183 lb (83.008 kg)  10/10/15 183 lb (83.008 kg)      Studies/Labs Reviewed:     EKG:  EKG is   ordered today.  The ekg ordered today demonstrates NSR, HR 97, normal axis, low voltage, poor R-wave progression, QTc 454 ms  Recent Labs: 09/20/2015: Magnesium 2.1 09/24/2015: B Natriuretic Peptide 892.4* 09/25/2015: ALT 44; TSH 2.184 10/09/2015: BUN 48*; Creatinine 2.2*; Hemoglobin 9.8*; Platelets 385; Potassium 4.9; Sodium 140   Recent Lipid Panel    Component Value Date/Time   CHOL 139 09/15/2015 0343   TRIG 119 09/15/2015 0343   HDL 35* 09/15/2015 0343   CHOLHDL 4.0 09/15/2015 0343   VLDL 24 09/15/2015 0343   LDLCALC 80 09/15/2015 0343    Additional studies/ records that were reviewed today include:   Carotid US 09/18/15 Bilateral ICA 1-39%  LHC 09/17/15 LAD proximal 90%, D2 80% LCx ostial 99% RCA proximal 60%, distal 75%, RPDA 25% Severe three vessel disease. Plan for CVTS consult for CABG. She needs BP control, beta blocker and statin.  Echo 09/17/15 Mild focal basal septal hypertrophy, vigorous LVF, EF 65-70%, normal wall motion, grade 1 diastolic dysfunction, MAC  ASSESSMENT:     1. Acute on chronic diastolic CHF (congestive heart failure) ([place])   2. Coronary artery disease involving native coronary artery of native heart without angina pectoris   3. Essential hypertension   4. Hyperlipidemia   5. CKD (chronic kidney disease), stage III   6. Type 2 diabetes mellitus with stage 3 chronic kidney disease, with long-term current use of insulin ([facility])   7. Chest pain, unspecified chest pain type   8. Shortness of breath     PLAN:     In order of problems listed above:  1. A/C Diastolic CHF -  She is volume overloaded.  Diastolic heart failure is likely exacerbated by chronic kidney disease. She was previously on Lasix every  other day. This was  stopped at some point in the past week due to worsening creatinine. She needs diuresis.  -  Check BMET today  -  Start Lasix 40 mg daily 3 days, then reduce to 20 mg daily  -  BMET 1 week  -  Early FU with me in 1 week  -  Weigh daily. Call if weights increasing or edema worsening.   -  Obtain CXR  -  Check O2 sats >> 88% on RA at rest; improved to 96% on 2LPM at rest >> order home O2    2. CAD - s/p CABG.  Slow to progress.  She is volume overloaded as noted above.  I will adjust Lasix for diuresis. She was DC from SNF recently.  She is now at home with HHPT.  She will need to start cardiac rehab once her HHPT is completed.  -  Continue ASA, statin, beta-blocker   -  Eventually refer to Cardiac Rehab  -  FU with me in 1 week and Dr. Ena Dawley thereafter.   3. HTN - Controlled.   4. HL -  Managed by PCP.  Goal LDL < 70. Continue statin.  5. CKD - Managed by Nephrology in [place].  She has an appt in May. Will need to keep a close eye on creatinine with adjustments in diuretics.    6. Diabetes - FU with Endocrine in [place].  7. Chest pain - Present since her surgery.  Probably MSK related to surgery.  Will get CXR and adjust diuretics for volume excess as noted. With hypoxia, will get DDimer as well.  If +, she will need VQ scan and LE venous duplex (can be done in [facility]).    8. Dyspnea - see #7  Total time spent with patient 45 minutes (reviewing records, evaluating patient and coordinating care).  Face to face time > 50%   Medication Adjustments/Labs and Tests Ordered: Current medicines are reviewed at length with the patient today.  Concerns regarding medicines are outlined above.  Medication changes, Labs and Tests ordered today are outlined in the Patient Instructions noted below. Patient Instructions  Medication Instructions:  1. START LASIX 40 MG DAILY (2 TABS DAILY) TODAY, 4/15, 4/16; STARTING ON 4/17 YOU WILL BE ON LASIX 20 MG DAILY (1 TAB  DAILY) Labwork: 1. TODAY BMET, D-DIMER 2. BMET IN 1 WEEK Testing/Procedures: A chest x-ray takes a picture of the organs and structures inside the chest, including the heart, lungs, and blood vessels. This test can show several things, including, whether the heart is enlarges; whether fluid is building up in the lungs; and whether pacemaker / defibrillator leads are still in place. THIS IS TO BE DONE TODAY AT [facility] Follow-Up: [place] 10/26/15 @ 11:10  Any Other Special Instructions Will Be Listed Below (If Applicable). If you need a refill on your cardiac medications before your next appointment, please call your pharmacy.   Signed, Richardson Dopp, PA-C  10/19/2015 1:06 PM    [facility] [place], [place], [place]  [address] Phone: [phone]; Fax: [phone]

## 2015-10-20 DIAGNOSIS — E1129 Type 2 diabetes mellitus with other diabetic kidney complication: Secondary | ICD-10-CM | POA: Diagnosis not present

## 2015-10-20 DIAGNOSIS — I129 Hypertensive chronic kidney disease with stage 1 through stage 4 chronic kidney disease, or unspecified chronic kidney disease: Secondary | ICD-10-CM | POA: Diagnosis not present

## 2015-10-20 DIAGNOSIS — J45909 Unspecified asthma, uncomplicated: Secondary | ICD-10-CM | POA: Diagnosis not present

## 2015-10-20 DIAGNOSIS — N183 Chronic kidney disease, stage 3 (moderate): Secondary | ICD-10-CM | POA: Diagnosis not present

## 2015-10-20 DIAGNOSIS — Z48812 Encounter for surgical aftercare following surgery on the circulatory system: Secondary | ICD-10-CM | POA: Diagnosis not present

## 2015-10-20 DIAGNOSIS — I2510 Atherosclerotic heart disease of native coronary artery without angina pectoris: Secondary | ICD-10-CM | POA: Diagnosis not present

## 2015-10-22 ENCOUNTER — Telehealth: Payer: Self-pay | Admitting: Interventional Cardiology

## 2015-10-22 ENCOUNTER — Telehealth: Payer: Self-pay | Admitting: *Deleted

## 2015-10-22 DIAGNOSIS — I10 Essential (primary) hypertension: Secondary | ICD-10-CM

## 2015-10-22 NOTE — Telephone Encounter (Signed)
Permission on file ok to s/w daughter [name]. Lavern notified No PE per VQ Scan done 4/14 at [facility] per Brynda Rim. PA. BMET 4/18 in our office. Lavern verbalized understanding to plan of care for the pt.

## 2015-10-22 NOTE — Telephone Encounter (Signed)
**Note De-Identified  Obfuscation** Monica [name] with [facility] has been given a verbal order to arrange tele health which includes BP, weight and O2 monitoring for 1 to 2 months.  Will forward to Dr Irish Lack as Juluis Rainier.

## 2015-10-22 NOTE — Telephone Encounter (Signed)
New Message  [facility] nurse admitted her for home health would like to arrange tele health. Please call back with the order

## 2015-10-22 NOTE — Telephone Encounter (Signed)
This patient follows with Dr. [name].

## 2015-10-23 ENCOUNTER — Telehealth: Payer: Self-pay | Admitting: *Deleted

## 2015-10-23 ENCOUNTER — Other Ambulatory Visit (INDEPENDENT_AMBULATORY_CARE_PROVIDER_SITE_OTHER): Payer: Medicare HMO | Admitting: *Deleted

## 2015-10-23 DIAGNOSIS — R0602 Shortness of breath: Secondary | ICD-10-CM | POA: Diagnosis not present

## 2015-10-23 DIAGNOSIS — I10 Essential (primary) hypertension: Secondary | ICD-10-CM

## 2015-10-23 DIAGNOSIS — E1129 Type 2 diabetes mellitus with other diabetic kidney complication: Secondary | ICD-10-CM | POA: Diagnosis not present

## 2015-10-23 DIAGNOSIS — I5033 Acute on chronic diastolic (congestive) heart failure: Secondary | ICD-10-CM | POA: Diagnosis not present

## 2015-10-23 DIAGNOSIS — Z951 Presence of aortocoronary bypass graft: Secondary | ICD-10-CM | POA: Diagnosis not present

## 2015-10-23 DIAGNOSIS — Z48812 Encounter for surgical aftercare following surgery on the circulatory system: Secondary | ICD-10-CM | POA: Diagnosis not present

## 2015-10-23 LAB — BASIC METABOLIC PANEL
BUN: 39 mg/dL — ABNORMAL HIGH (ref 7–25)
CHLORIDE: 100 mmol/L (ref 98–110)
CO2: 30 mmol/L (ref 20–31)
Calcium: 8.5 mg/dL — ABNORMAL LOW (ref 8.6–10.4)
Creat: 2.05 mg/dL — ABNORMAL HIGH (ref 0.60–0.88)
GLUCOSE: 202 mg/dL — AB (ref 65–99)
Potassium: 5.1 mmol/L (ref 3.5–5.3)
SODIUM: 138 mmol/L (ref 135–146)

## 2015-10-23 NOTE — Telephone Encounter (Signed)
DPR for son Gwyndolyn Saxon who has been notified of lab results for pt by phone with verbal understanding. I confirmed appt 4/21 with Brynda Rim. PA @ 11:10 with pt's son. Asked to arrive 15-20 min early for registration.

## 2015-10-24 NOTE — Telephone Encounter (Signed)
Ivy Do I need to do anything with this? Richardson Dopp, PA-C   10/24/2015 8:32 AM

## 2015-10-24 NOTE — Telephone Encounter (Signed)
I think maybe you ordered home health or oxygen on this pt? Dr [name] and myself were unsure why Monica [name] was asking for orders for tele health and O2 monitoring.

## 2015-10-25 NOTE — Telephone Encounter (Signed)
Ok to give orders for O2. See last OV note from me. Richardson Dopp, PA-C   10/25/2015 8:08 AM

## 2015-10-25 NOTE — Progress Notes (Signed)
Cardiology Office Note:    Date:  10/26/2015   ID:  Monica [name], DOB 01-22-1931, MRN [id number]  PCP:  Benito Mccreedy, MD  Cardiologist:  Dr. Ena Dawley   Electrophysiologist:  N/a Nephrologist: Dr. Joesph July [facility])  Referring MD: Benito Mccreedy, MD   Chief Complaint  Patient presents with  . Congestive Heart Failure    follow up    History of Present Illness:     Monica [name] is a 82 y.o. female with a hx of HTN, HL, diabetes, asthma, GERD, gout, CKD, PUD. Admitted 3/10-3/29 with unstable angina. Cardiac enzymes remained normal. Cardiac catheterization demonstrated severe 3 vessel CAD. Echocardiogram demonstrated normal LV function. She was seen by Dr. Servando Snare for cardiac surgery and underwent CABG with LIMA-LAD, SVG-intermediate, SVG-OM, SVG-distal RCA. Postoperative course was complicated by blood loss anemia requiring transfusion with PRBCs. There was some concern for aspiration due to difficulty with swallowing and she had a Panda feeding tube for some time. She had some mental status changes as well. CVA could not be ruled out but symptoms were overall felt to be due to metabolic encephalopathy. Mental status changes improved. She had some elevation in her creatinine and was followed by nephrology. She maintained sinus rhythm. She was DC to [facility].    I saw her last week.  She had been DC from SNF 2 days prior.  She noted dyspnea that was worsening. She appeared to be volume overloaded. O2 sats were low. We arranged home O2 and adjusted her diuretics. D-dimer returned abnormal. VQ scan at [facility] was low probability for pulmonary embolism. Chest x-ray did demonstrate bilateral pleural effusions. Returns for FU. Here with her son. She is feeling better. Breathing is improved. She still has difficulty laying flat. Chest discomfort is also improved. Denies any coughing or wheezing. Denies syncope. Denies any bleeding issues. Denies fever.   Past Medical  History  Diagnosis Date  . Peptic ulcer   . Mass   . Essential hypertension   . Hyperlipidemia   . Type 2 diabetes mellitus ([place])   . Asthma   . History of blood transfusion   . CKD (chronic kidney disease), stage III   . Physical deconditioning   . Coronary artery disease involving native coronary artery of native heart without angina pectoris   . GERD (gastroesophageal reflux disease)   . Acute blood loss anemia   . Protein calorie malnutrition ([place])   . Chest wall pain   . Asthma   . Dyspnea   . Encounter for central line care   . Candida infection 10/2015    Bilateral breasts    Past Surgical History  Procedure Laterality Date  . Abdominal hysterectomy    . Colon surgery    . Cardiac catheterization N/A 09/17/2015    Procedure: Left Heart Cath and Coronary Angiography;  Surgeon: Jettie Booze, MD;  Location: [place];  Service: Cardiovascular;  Laterality: N/A;  . Coronary artery bypass graft N/A 09/19/2015    Procedure: CORONARY ARTERY BYPASS GRAFTING (CABG) TIMES FOUR USING BILATARAL SAPHENOUS VEIN GRAFTS AND LEFT INTERNAL MAMMARY ARTERY;  Surgeon: [name], MD;  Location: [facility];  Service: Open Heart Surgery;  Laterality: N/A;  . Tee without cardioversion N/A 09/19/2015    Procedure: TRANSESOPHAGEAL ECHOCARDIOGRAM (TEE);  Surgeon: [name], MD;  Location: [facility];  Service: Open Heart Surgery;  Laterality: N/A;    Current Medications: Outpatient Prescriptions Prior to Visit  Medication Sig Dispense Refill  .  albuterol (PROVENTIL HFA;VENTOLIN HFA) 108 (90 Base) MCG/ACT inhaler Inhale 2 puffs into the lungs every 6 (six) hours as needed for wheezing or shortness of breath.    Marland Kitchen amLODipine (NORVASC) 10 MG tablet Take 10 mg by mouth daily.    Marland Kitchen aspirin EC 81 MG EC tablet Take 1 tablet (81 mg total) by mouth daily.    Marland Kitchen atorvastatin (LIPITOR) 40 MG tablet Take 1 tablet (40 mg total) by mouth daily at 6 PM.    . Calcium Carb-Cholecalciferol  [phone] MG-UNIT CAPS Take 1 tablet by mouth daily.    . calcium carbonate (TUMS - DOSED IN MG ELEMENTAL CALCIUM) 500 MG chewable tablet Chew 1 tablet by mouth daily as needed for indigestion or heartburn.    . Cholecalciferol (VITAMIN D-3) 1000 units CAPS Take 1 capsule by mouth daily.    . diphenhydrAMINE (BENADRYL) 25 MG tablet Take 25 mg by mouth as needed for itching or allergies.     . ferrous sulfate 325 (65 FE) MG tablet Take 325 mg by mouth daily with breakfast.    . insulin aspart (NOVOLOG) 100 UNIT/ML injection Inject 0-24 Units into the skin. 0-24 units TID AC and HS as follows: 0-60 = 0 units, hypoglycemic protocol and call MD; 61-119 = 0 units; 120-160 = 2 units; 161-200 =4 units, 201-250 = 8 units, 251-300 = 12 units, 301-350 = 16 units, 351-450 = 20 units, >450 = 24 units and call MD    . insulin glargine (LANTUS) 100 UNIT/ML injection Inject 20 Units into the skin 2 (two) times daily.     . metoprolol tartrate (LOPRESSOR) 25 MG tablet Take 1 tablet (25 mg total) by mouth 2 (two) times daily.    Marland Kitchen nystatin (MYCOSTATIN) powder Apply topically 2 (two) times daily. 100,000 units/gm powder applied under bilateral breasts BID x3 weeks    . furosemide (LASIX) 20 MG tablet Take 1 tablet (20 mg total) by mouth as directed. Take 2 tabs 4/14, 4/15, 4/16;  Starting 4/17 take 1 tab daily 35 tablet 11   No facility-administered medications prior to visit.     Allergies:   Review of patient's allergies indicates no known allergies.   Social History   Social History  . Marital Status: Widowed    Spouse Name: N/A  . Number of Children: N/A  . Years of Education: N/A   Occupational History  . Retired    Social History Main Topics  . Smoking status: Never Smoker   . Smokeless tobacco: Former Systems developer    Types: Snuff  . Alcohol Use: No  . Drug Use: No  . Sexual Activity: Not Asked   Other Topics Concern  . None   Social History Narrative     Family History:  The patient's family  history includes Heart attack in her father.   ROS:   Please see the history of present illness.    ROS All other systems reviewed and are negative.   Physical Exam:    VS:  BP 140/58 mmHg  Pulse 88  Ht 5\' 2"  (1.575 m)  Wt 180 lb 12.8 oz (82.01 kg)  BMI 33.06 kg/m2   GEN: Well nourished, well developed, in no acute distress HEENT: normal Neck: JVP 6-7 cm, no masses Cardiac: Normal S1/S2, RRR; [id number] systolic murmur LSB, no rubs, or gallops, 1-2+ bilateral LE edema to the knees Respiratory:  Decreased breath sounds bilaterally (R >L), + R base egophony, no wheezing GI: soft, nontender, nondistended MS: no deformity or  atrophy Skin: warm and dry Neuro: No focal deficits  Psych: Alert and oriented x 3, normal affect  Wt Readings from Last 3 Encounters:  10/26/15 180 lb 12.8 oz (82.01 kg)  10/19/15 180 lb 6.4 oz (81.829 kg)  10/16/15 183 lb (83.008 kg)      Studies/Labs Reviewed:     EKG:  EKG is  ordered today.  The ekg ordered today demonstrates NSR, HR 97, normal axis, low voltage, poor R-wave progression, QTc 454 ms  Recent Labs: 09/20/2015: Magnesium 2.1 09/24/2015: B Natriuretic Peptide 892.4* 09/25/2015: ALT 44; TSH 2.184 10/09/2015: Hemoglobin 9.8*; Platelets 385 10/23/2015: BUN 39*; Creat 2.05*; Potassium 5.1; Sodium 138   Recent Lipid Panel    Component Value Date/Time   CHOL 139 09/15/2015 0343   TRIG 119 09/15/2015 0343   HDL 35* 09/15/2015 0343   CHOLHDL 4.0 09/15/2015 0343   VLDL 24 09/15/2015 0343   LDLCALC 80 09/15/2015 0343    Additional studies/ records that were reviewed today include:   Dg Chest 2 View  10/19/2015  IMPRESSION: 1. Cardiac enlargement and bilateral pleural effusions right greater than left.   VQ Scan at [facility] 10/19/15 IMPRESSION: Low probability for pulmonary embolus.    Carotid US 09/18/15 Bilateral ICA 1-39%  LHC 09/17/15 LAD proximal 90%, D2 80% LCx ostial 99% RCA proximal 60%, distal 75%, RPDA 25% Severe three vessel  disease. Plan for CVTS consult for CABG. She needs BP control, beta blocker and statin.  Echo 09/17/15 Mild focal basal septal hypertrophy, vigorous LVF, EF 65-70%, normal wall motion, grade 1 diastolic dysfunction, MAC   ASSESSMENT:     1. Acute on chronic diastolic CHF (congestive heart failure) ([place])   2. Coronary artery disease involving native coronary artery of native heart without angina pectoris   3. Essential hypertension   4. Hyperlipidemia   5. CKD (chronic kidney disease), stage III   6. Bilateral pleural effusion     PLAN:     In order of problems listed above:  1. A/C Diastolic CHF -  Volume is improved. She still has a little ways to go. I feel that she would be better off on a higher dose of Lasix. Increase Lasix to 40 mg daily. Obtain follow-up BMET next week. Continue O2 for now.    2. CAD - s/p CABG.  Slow to progress.  Continue HHPT.  She will need to start cardiac rehab once her HHPT is completed.  Continue ASA, statin, beta-blocker.  3. HTN - Controlled.   4. HL -  Managed by PCP.  Goal LDL < 70. Continue statin.  5. CKD - Managed by Nephrology in [place].  I will keep a close eye on her renal function and potassium given adjustments in her diuretics.   6. Pleural effusions - She had bilateral effusions on recent chest x-ray. Given her symptoms and exam, these appear to be stable. Continue diuresis. She has follow-up with Dr. Servando Snare soon. She will have another chest x-ray at that appointment. She knows to contact us if her symptoms should worsen.    Medication Adjustments/Labs and Tests Ordered: Current medicines are reviewed at length with the patient today.  Concerns regarding medicines are outlined above.  Medication changes, Labs and Tests ordered today are outlined in the Patient Instructions noted below. Patient Instructions  Medication Instructions:  INCREASE LASIX TO 40 MG DAILY; RX SENT IN Labwork: BMET TO BE DONE NEXT WED  10/31/15 Testing/Procedures: NONE Follow-Up: Friday 11/23/15 @ 11 AM DR. NELSON  Any Other Special Instructions Will Be Listed Below (If Applicable). YOU CAN TRY BENADRYL OTC TO HELP WITH SLEEP If you need a refill on your cardiac medications before your next appointment, please call your pharmacy.     Signed, Richardson Dopp, PA-C  10/26/2015 1:16 PM    [facility] [place], [place],   [address] Phone: [phone]; Fax: [phone]

## 2015-10-26 ENCOUNTER — Ambulatory Visit (INDEPENDENT_AMBULATORY_CARE_PROVIDER_SITE_OTHER): Payer: Medicare HMO | Admitting: Physician Assistant

## 2015-10-26 ENCOUNTER — Encounter: Payer: Self-pay | Admitting: Physician Assistant

## 2015-10-26 VITALS — BP 140/58 | HR 88 | Ht 62.0 in | Wt 180.8 lb

## 2015-10-26 DIAGNOSIS — N183 Chronic kidney disease, stage 3 unspecified: Secondary | ICD-10-CM

## 2015-10-26 DIAGNOSIS — E785 Hyperlipidemia, unspecified: Secondary | ICD-10-CM

## 2015-10-26 DIAGNOSIS — I2510 Atherosclerotic heart disease of native coronary artery without angina pectoris: Secondary | ICD-10-CM | POA: Diagnosis not present

## 2015-10-26 DIAGNOSIS — I10 Essential (primary) hypertension: Secondary | ICD-10-CM

## 2015-10-26 DIAGNOSIS — J90 Pleural effusion, not elsewhere classified: Secondary | ICD-10-CM

## 2015-10-26 DIAGNOSIS — I5033 Acute on chronic diastolic (congestive) heart failure: Secondary | ICD-10-CM | POA: Diagnosis not present

## 2015-10-26 MED ORDER — FUROSEMIDE 20 MG PO TABS: 40.0000 mg | ORAL_TABLET | Freq: Every day | ORAL | Status: DC

## 2015-10-26 NOTE — Patient Instructions (Addendum)
Medication Instructions:  INCREASE LASIX TO 40 MG DAILY; RX SENT IN Labwork: BMET TO BE DONE NEXT WED 10/31/15 Testing/Procedures: NONE Follow-Up: Friday 11/23/15 @ 11 AM DR. NELSON  Any Other Special Instructions Will Be Listed Below (If Applicable). YOU CAN TRY BENADRYL OTC TO HELP WITH SLEEP If you need a refill on your cardiac medications before your next appointment, please call your pharmacy.

## 2015-10-29 ENCOUNTER — Telehealth: Payer: Self-pay | Admitting: Cardiology

## 2015-10-29 NOTE — Telephone Encounter (Signed)
New message  Please call back with an ICD code for the Cabg x4.

## 2015-10-29 NOTE — Telephone Encounter (Signed)
New message   Please fax order to [phone]   She needs an order to have labs to be done from home and the rn said that she can have it sent to the office  Also an order for teleamonortering   You can leave a vm

## 2015-10-29 NOTE — Telephone Encounter (Signed)
LMTCB for Jasmine at [facility].  LMTCB for Brett Canales, [place] Go RN to confirm fax number for BMET order.

## 2015-10-30 ENCOUNTER — Telehealth: Payer: Self-pay | Admitting: *Deleted

## 2015-10-30 ENCOUNTER — Telehealth: Payer: Self-pay | Admitting: Cardiology

## 2015-10-30 NOTE — Telephone Encounter (Signed)
Follow up   Monica [name] is requesting ICD CABG CODE   Call with code

## 2015-10-30 NOTE — Telephone Encounter (Signed)
Follow up   Monica [name] is calling from [facility]   She verbalized she is calling to speak with Arbie Cookey she is returning her call  For an ICD 10 CODE on PT  Dr.Samual Domenic Polite seen pt in unknown which hospital and she sent referral to him and then Dr. [name] signed on the order (Not a date ) faxed on 10/26/15   Someone wrote she has CABG X4 but no code was listed  To get pt started on cardiac rehab    She will be in the office until 3pm

## 2015-10-30 NOTE — Telephone Encounter (Signed)
I have been trying to reach Monica [name] who I believe is calling from [place]. Previous message from [place] looks like she is asking for the ICD 10 code for CABG x 4. I will be out of the office today at 3 pm, will return 4/26 8-5 pm .

## 2015-10-30 NOTE — Telephone Encounter (Signed)
Jamine cb while I was in the room with a pt. I tcb Jasmine again.

## 2015-10-30 NOTE — Telephone Encounter (Signed)
I am returning call to Brett Canales, RN at [facility] in regards to a confusing message with either needing an order for bmet or was she needing to confirm our fax #. I lmtcb so that I may try to answer her question.

## 2015-10-30 NOTE — Telephone Encounter (Signed)
I Lmtcb for Monica [name] at [facility] who is stating she needs ICD 10 code for CABG x 4 ( Z95.1). When she calls back I will confirm that she is calling from [place], Also confirm that rehab not being done yet. Per PA ov note cardiac rehab not to be done until home PT is done.

## 2015-10-30 NOTE — Telephone Encounter (Signed)
I have already s/w Brett Canales, RN from [place] today. Jackelyn Poling has been given the v/o orders that she was calling about today.

## 2015-10-30 NOTE — Telephone Encounter (Signed)
I s/w Brett Canales, RN today w/[facility] in regards to needing v/o for bmet, as well as v/o for tele monitoring for BP, Spo2, Weight. I ahd already ordered  Home O2 was last week through [facility]. Brynda Rim. PA gave v/o for lab and tele monitoring. Jackelyn Poling thanked [place] for our time and our help today in helping care for the pt.

## 2015-10-30 NOTE — Telephone Encounter (Addendum)
Will send this message to [facility] covering [place], for they have had direct care with this pt in the office on 10/26/15, and orders for lab work was given then.  Will have Scott's covering CMA follow-up with Home Health Nurse Brett Canales at [facility], in regards to requested BMET order.   Also there has been 2 different phone encounters, incorrectly connected to this message by [employer].  It appears that the other request needed is from [place] at [place], requesting the ICD-10 code on this pt for s/p CABG, to attend Cardiac Rehab at [facility]. Will follow-up myself with Trinidad Curet covering [place], on how I can assist in getting requested information from 2 different parties, correctly and in order of sequence. Scott's CMA aware of this encounter to be sent to her for further review and follow-up with both Cardiac Rehab and [place], at [place].

## 2015-10-30 NOTE — Telephone Encounter (Signed)
Brett Canales Is calling back

## 2015-10-31 ENCOUNTER — Other Ambulatory Visit: Payer: Medicare HMO

## 2015-10-31 DIAGNOSIS — E1165 Type 2 diabetes mellitus with hyperglycemia: Secondary | ICD-10-CM | POA: Diagnosis not present

## 2015-10-31 DIAGNOSIS — E1169 Type 2 diabetes mellitus with other specified complication: Secondary | ICD-10-CM | POA: Diagnosis not present

## 2015-10-31 DIAGNOSIS — E785 Hyperlipidemia, unspecified: Secondary | ICD-10-CM | POA: Diagnosis not present

## 2015-10-31 DIAGNOSIS — I509 Heart failure, unspecified: Secondary | ICD-10-CM | POA: Diagnosis not present

## 2015-10-31 DIAGNOSIS — E559 Vitamin D deficiency, unspecified: Secondary | ICD-10-CM | POA: Diagnosis not present

## 2015-11-02 DIAGNOSIS — E1169 Type 2 diabetes mellitus with other specified complication: Secondary | ICD-10-CM | POA: Diagnosis not present

## 2015-11-02 DIAGNOSIS — Z951 Presence of aortocoronary bypass graft: Secondary | ICD-10-CM | POA: Diagnosis not present

## 2015-11-02 DIAGNOSIS — E785 Hyperlipidemia, unspecified: Secondary | ICD-10-CM | POA: Diagnosis not present

## 2015-11-02 DIAGNOSIS — E559 Vitamin D deficiency, unspecified: Secondary | ICD-10-CM | POA: Diagnosis not present

## 2015-11-02 DIAGNOSIS — N184 Chronic kidney disease, stage 4 (severe): Secondary | ICD-10-CM | POA: Diagnosis not present

## 2015-11-02 DIAGNOSIS — I10 Essential (primary) hypertension: Secondary | ICD-10-CM | POA: Diagnosis not present

## 2015-11-02 DIAGNOSIS — I2510 Atherosclerotic heart disease of native coronary artery without angina pectoris: Secondary | ICD-10-CM | POA: Diagnosis not present

## 2015-11-02 DIAGNOSIS — E119 Type 2 diabetes mellitus without complications: Secondary | ICD-10-CM | POA: Diagnosis not present

## 2015-11-05 ENCOUNTER — Ambulatory Visit (INDEPENDENT_AMBULATORY_CARE_PROVIDER_SITE_OTHER): Payer: Self-pay | Admitting: Physician Assistant

## 2015-11-05 ENCOUNTER — Other Ambulatory Visit: Payer: Self-pay | Admitting: Cardiothoracic Surgery

## 2015-11-05 ENCOUNTER — Ambulatory Visit
Admission: RE | Admit: 2015-11-05 | Discharge: 2015-11-05 | Disposition: A | Discharge status: Home / Self Care | Payer: Medicare HMO | Source: Ambulatory Visit | Attending: Cardiothoracic Surgery | Admitting: Cardiothoracic Surgery

## 2015-11-05 ENCOUNTER — Other Ambulatory Visit: Payer: Self-pay | Admitting: *Deleted

## 2015-11-05 VITALS — BP 138/64 | HR 84 | Resp 20 | Ht 62.0 in | Wt 180.0 lb

## 2015-11-05 DIAGNOSIS — J90 Pleural effusion, not elsewhere classified: Secondary | ICD-10-CM

## 2015-11-05 DIAGNOSIS — Z951 Presence of aortocoronary bypass graft: Secondary | ICD-10-CM

## 2015-11-05 DIAGNOSIS — R0602 Shortness of breath: Secondary | ICD-10-CM | POA: Diagnosis not present

## 2015-11-05 NOTE — Progress Notes (Signed)
HPI:   Patient returns for routine postoperative follow-up having undergone CABG x 4 on 09/19/2015. The patient's early postoperative recovery while in the hospital was notable for dysphagia, metabolic encephalopathy and deconditioning requiring discharge to SNF.  Since hospital discharge the patient reports she developed worsening shortness of breath.  Workup via Cardiology office ruled the patient out for Pulmonary embolism, but did require an increase in the patient dosage of lasix and ordering of home oxygen.  Currently the patient states she is feeling better.  She continues to have some shortness of breath, but this has improved overall.  She is not on oxygen in the office today, but states she continues to use this at home.  She continues to take her Lasix.  She is ambulating without much difficulty and states she has been slowly getting stronger.  Her incisions are well healed without evidence of infection.  She has not yet seen her kidney doctor.   Current Outpatient Prescriptions  Medication Sig Dispense Refill  . albuterol (PROVENTIL HFA;VENTOLIN HFA) 108 (90 Base) MCG/ACT inhaler Inhale 2 puffs into the lungs every 6 (six) hours as needed for wheezing or shortness of breath.    Marland Kitchen amLODipine (NORVASC) 10 MG tablet Take 10 mg by mouth daily.    Marland Kitchen aspirin EC 81 MG EC tablet Take 1 tablet (81 mg total) by mouth daily.    Marland Kitchen atorvastatin (LIPITOR) 40 MG tablet Take 1 tablet (40 mg total) by mouth daily at 6 PM.    . Calcium Carb-Cholecalciferol [phone] MG-UNIT CAPS Take 1 tablet by mouth daily.    . calcium carbonate (TUMS - DOSED IN MG ELEMENTAL CALCIUM) 500 MG chewable tablet Chew 1 tablet by mouth daily as needed for indigestion or heartburn.    . Cholecalciferol (VITAMIN D-3) 1000 units CAPS Take 1 capsule by mouth daily.    . diphenhydrAMINE (BENADRYL) 25 MG tablet Take 25 mg by mouth as needed for itching or allergies.     . ferrous sulfate 325 (65 FE) MG tablet Take 325 mg by mouth  daily with breakfast.    . furosemide (LASIX) 20 MG tablet Take 2 tablets (40 mg total) by mouth daily. 60 tablet 11  . insulin aspart (NOVOLOG) 100 UNIT/ML injection Inject 0-24 Units into the skin. 0-24 units TID AC and HS as follows: 0-60 = 0 units, hypoglycemic protocol and call MD; 61-119 = 0 units; 120-160 = 2 units; 161-200 =4 units, 201-250 = 8 units, 251-300 = 12 units, 301-350 = 16 units, 351-450 = 20 units, >450 = 24 units and call MD    . insulin glargine (LANTUS) 100 UNIT/ML injection Inject 20 Units into the skin 2 (two) times daily.     . metoprolol tartrate (LOPRESSOR) 25 MG tablet Take 1 tablet (25 mg total) by mouth 2 (two) times daily.    Marland Kitchen nystatin (MYCOSTATIN) powder Apply topically 2 (two) times daily. 100,000 units/gm powder applied under bilateral breasts BID x3 weeks     No current facility-administered medications for this visit.    Physical Exam:  BP 138/64 mmHg  Pulse 84  Resp 20  Ht 5\' 2"  (1.575 m)  Wt 180 lb (81.647 kg)  BMI 32.91 kg/m2  SpO2 90%  Gen: no apparent distress Heart: RRR Lungs: diminished breath sounds bilaterally Abd: soft non-tender, non-distended Ext: trace to 1+ bilateral LE edema Incisions: well healed  Diagnostic Tests:  CXR: small to moderate bilateral pleural effusions  A/P:  1. S/P CABG- making good progress 2.  Dyspnea, Bilateral Pleural Effusions- agree with increased dose of Lasix by Cardiology, will refer patient to have bilateral Thoracentesis over the next several days, which should help with her breathing and hopefully eliminate need for home oxygen 3. CKD- per patient scheduled to see Nephrology on 11/12/2015 4.  RTC in 1 week with repeat CXR after Thoracentesis  Ellwood Handler, PA-C [facility] [phone]

## 2015-11-07 ENCOUNTER — Ambulatory Visit (HOSPITAL_COMMUNITY)
Admission: RE | Admit: 2015-11-07 | Discharge: 2015-11-07 | Disposition: A | Discharge status: Home / Self Care | Payer: Medicare HMO | Source: Ambulatory Visit | Attending: Radiology | Admitting: Radiology

## 2015-11-07 ENCOUNTER — Ambulatory Visit (HOSPITAL_COMMUNITY)
Admission: RE | Admit: 2015-11-07 | Discharge: 2015-11-07 | Disposition: A | Discharge status: Home / Self Care | Payer: Medicare HMO | Source: Ambulatory Visit | Attending: Cardiothoracic Surgery | Admitting: Cardiothoracic Surgery

## 2015-11-07 DIAGNOSIS — Z9889 Other specified postprocedural states: Secondary | ICD-10-CM

## 2015-11-07 DIAGNOSIS — J948 Other specified pleural conditions: Secondary | ICD-10-CM | POA: Insufficient documentation

## 2015-11-07 DIAGNOSIS — J90 Pleural effusion, not elsewhere classified: Secondary | ICD-10-CM

## 2015-11-07 MED ORDER — LIDOCAINE HCL (PF) 1 % IJ SOLN
INTRAMUSCULAR | Status: AC
  Filled 2015-11-07: qty 10

## 2015-11-07 NOTE — Procedures (Signed)
Ultrasound-guided  therapeutic right thoracentesis performed yielding 600 cc of amber fluid. No immediate complications. Follow-up chest x-ray pending.

## 2015-11-08 ENCOUNTER — Ambulatory Visit: Payer: Medicare HMO | Admitting: Cardiothoracic Surgery

## 2015-11-08 ENCOUNTER — Ambulatory Visit (HOSPITAL_COMMUNITY): Payer: Medicare HMO

## 2015-11-09 ENCOUNTER — Ambulatory Visit (HOSPITAL_COMMUNITY)
Admission: RE | Admit: 2015-11-09 | Discharge: 2015-11-09 | Disposition: A | Discharge status: Home / Self Care | Payer: Medicare HMO | Source: Ambulatory Visit | Attending: Cardiothoracic Surgery | Admitting: Cardiothoracic Surgery

## 2015-11-09 ENCOUNTER — Ambulatory Visit (HOSPITAL_COMMUNITY)
Admission: RE | Admit: 2015-11-09 | Discharge: 2015-11-09 | Disposition: A | Discharge status: Home / Self Care | Payer: Medicare HMO | Source: Ambulatory Visit | Attending: Radiology | Admitting: Radiology

## 2015-11-09 DIAGNOSIS — Z9889 Other specified postprocedural states: Secondary | ICD-10-CM | POA: Insufficient documentation

## 2015-11-09 DIAGNOSIS — J90 Pleural effusion, not elsewhere classified: Secondary | ICD-10-CM | POA: Diagnosis not present

## 2015-11-09 DIAGNOSIS — J9811 Atelectasis: Secondary | ICD-10-CM | POA: Insufficient documentation

## 2015-11-09 MED ORDER — LIDOCAINE HCL (PF) 1 % IJ SOLN
INTRAMUSCULAR | Status: AC
  Filled 2015-11-09: qty 10

## 2015-11-09 NOTE — Procedures (Signed)
   US guided Left thoracentesis  400 cc yellow fluid Pt tolerated well  cxr pending

## 2015-11-14 ENCOUNTER — Other Ambulatory Visit: Payer: Self-pay | Admitting: Cardiothoracic Surgery

## 2015-11-14 DIAGNOSIS — I25119 Atherosclerotic heart disease of native coronary artery with unspecified angina pectoris: Secondary | ICD-10-CM

## 2015-11-15 ENCOUNTER — Ambulatory Visit
Admission: RE | Admit: 2015-11-15 | Discharge: 2015-11-15 | Disposition: A | Discharge status: Home / Self Care | Payer: Medicare HMO | Source: Ambulatory Visit | Attending: Cardiothoracic Surgery | Admitting: Cardiothoracic Surgery

## 2015-11-15 ENCOUNTER — Encounter: Payer: Self-pay | Admitting: Cardiothoracic Surgery

## 2015-11-15 ENCOUNTER — Ambulatory Visit (INDEPENDENT_AMBULATORY_CARE_PROVIDER_SITE_OTHER): Payer: Self-pay | Admitting: Cardiothoracic Surgery

## 2015-11-15 VITALS — BP 143/73 | HR 81 | Resp 16

## 2015-11-15 DIAGNOSIS — Z951 Presence of aortocoronary bypass graft: Secondary | ICD-10-CM

## 2015-11-15 DIAGNOSIS — J90 Pleural effusion, not elsewhere classified: Secondary | ICD-10-CM

## 2015-11-15 DIAGNOSIS — J449 Chronic obstructive pulmonary disease, unspecified: Secondary | ICD-10-CM | POA: Diagnosis not present

## 2015-11-15 DIAGNOSIS — I25119 Atherosclerotic heart disease of native coronary artery with unspecified angina pectoris: Secondary | ICD-10-CM

## 2015-11-15 NOTE — Progress Notes (Signed)
[address]       [place] [address]             [phone]      [facility] Medical Record [id number] Date of Birth: [birth year]  Referring: Jettie Booze, MD Primary Care: Benito Mccreedy, MD  Chief Complaint:   POST OP FOLLOW UP 09/20/2015  OPERATIVE REPORT PREOPERATIVE DIAGNOSIS: Critical three-vessel coronary artery disease with unstable angina. POSTOPERATIVE DIAGNOSIS: Critical three-vessel coronary artery disease with unstable angina. SURGICAL PROCEDURE: Coronary artery bypass grafting x4, urgent coronary artery bypass grafting x4 with the left internal mammary to the left anterior descending coronary artery, reverse saphenous vein graft to the intermediate coronary artery, reverse saphenous vein graft to the obtuse marginal coronary artery, reverse saphenous vein graft to the distal right coronary artery with left greater saphenous thigh and calf endo- vein harvesting, and right thigh greater saphenous endo-vein harvesting. SURGEON: Lanelle Bal, M.D.  History of Present Illness:     Patient continues to slowly improve since seen 2 weeks ago she's had bilateral thoracentesis done for 100 drain from one side 600 from the other. She continues to increase her physical activity she's noted decreased edema in the lower extremities over the past 2 weeks.     Past Medical History  Diagnosis Date  . Peptic ulcer   . Mass   . Essential hypertension   . Hyperlipidemia   . Type 2 diabetes mellitus ([place])   . Asthma   . History of blood transfusion   . CKD (chronic kidney disease), stage III   . Physical deconditioning   . Coronary artery disease involving native coronary artery of native heart without angina pectoris   . GERD (gastroesophageal reflux disease)   . Acute blood loss anemia   . Protein calorie malnutrition ([place])   . Chest wall pain   . Asthma   . Dyspnea   . Encounter for central line care   . Candida  infection 10/2015    Bilateral breasts     History  Smoking status  . Never Smoker   Smokeless tobacco  . Former Systems developer  . Types: Snuff    History  Alcohol Use No     No Known Allergies  Current Outpatient Prescriptions  Medication Sig Dispense Refill  . albuterol (PROVENTIL HFA;VENTOLIN HFA) 108 (90 Base) MCG/ACT inhaler Inhale 2 puffs into the lungs every 6 (six) hours as needed for wheezing or shortness of breath.    Marland Kitchen amLODipine (NORVASC) 10 MG tablet Take 10 mg by mouth daily.    Marland Kitchen aspirin EC 81 MG EC tablet Take 1 tablet (81 mg total) by mouth daily.    Marland Kitchen atorvastatin (LIPITOR) 40 MG tablet Take 1 tablet (40 mg total) by mouth daily at 6 PM.    . Calcium Carb-Cholecalciferol [phone] MG-UNIT CAPS Take 1 tablet by mouth daily.    . calcium carbonate (TUMS - DOSED IN MG ELEMENTAL CALCIUM) 500 MG chewable tablet Chew 1 tablet by mouth daily as needed for indigestion or heartburn.    . Cholecalciferol (VITAMIN D-3) 1000 units CAPS Take 1 capsule by mouth daily.    . diphenhydrAMINE (BENADRYL) 25 MG tablet Take 25 mg by mouth as needed for itching or allergies.     . ferrous sulfate 325 (65 FE) MG tablet Take 325 mg by mouth daily with breakfast.    . furosemide (LASIX) 20 MG tablet Take 2 tablets (40 mg total) by mouth daily. [place]  tablet 11  . insulin glargine (LANTUS) 100 UNIT/ML injection Inject 20 Units into the skin 2 (two) times daily.     . metoprolol tartrate (LOPRESSOR) 25 MG tablet Take 1 tablet (25 mg total) by mouth 2 (two) times daily.    Marland Kitchen nystatin (MYCOSTATIN) powder Apply topically 2 (two) times daily. 100,000 units/gm powder applied under bilateral breasts BID x3 weeks     No current facility-administered medications for this visit.       Physical Exam: BP 143/73 mmHg  Pulse 81  Resp 16  SpO2 98% On 2 L of oxygen General appearance: alert, cooperative, appears stated age and no distress Neurologic: intact Heart: regular rate and rhythm, S1, S2 normal, no  murmur, click, rub or gallop Lungs: diminished breath sounds bibasilar Abdomen: soft, non-tender; bowel sounds normal; no masses,  no organomegaly Extremities: extremities normal, atraumatic, no cyanosis or Homans sign is negative, no sign of DVT, patient has continued pedal edema bilaterally though this is improved from her visit 2 weeks ago ondiuretic therapy Wound: Sternum is stable and well healed   Diagnostic Studies & Laboratory data:     Recent Radiology Findings:   Dg Chest 2 View  11/15/2015  CLINICAL DATA:  Post CABG on 09/19/2015, patient states breathing is improving, using oxygen less frequently, type II diabetes mellitus, asthma, essential hypertension, GERD EXAM: CHEST  2 VIEW COMPARISON:  11/09/2015 FINDINGS: Enlargement of cardiac silhouette with LEFT ventricular configuration post CABG. Atherosclerotic calcification aorta. Mild pulmonary vascular congestion. Bibasilar effusions and atelectasis, decreased. Upper lungs clear. Underlying emphysematous changes. No pneumothorax or definite segmental consolidation. Bones demineralized with degenerative disc disease changes thoracic spine. IMPRESSION: Enlargement cardiac silhouette post CABG with pulmonary vascular congestion. COPD changes with decreased bibasilar effusions and atelectasis since 11/09/2015. Electronically Signed   By: Lavonia Dana M.D.   On: 11/15/2015 13:44      Recent Lab Findings: Lab Results  Component Value Date   WBC 9.1 10/09/2015   HGB 9.8* 10/09/2015   HCT 32* 10/09/2015   PLT 385 10/09/2015   GLUCOSE 202* 10/23/2015   CHOL 139 09/15/2015   TRIG 119 09/15/2015   HDL 35* 09/15/2015   LDLCALC 80 09/15/2015   ALT 44 09/25/2015   AST 58* 09/25/2015   NA 138 10/23/2015   K 5.1 10/23/2015   CL 100 10/23/2015   CREATININE 2.05* 10/23/2015   BUN 39* 10/23/2015   CO2 30 10/23/2015   TSH 2.184 09/25/2015   INR 1.53* 09/19/2015   HGBA1C 7.6 10/09/2015      Assessment / Plan:      Patient now status  post bilateral thoracentesis with improved improvement in her respiratory status and her functional status proximal , 600 mL ML's was draining from one side and 400 from the other Overall she continues to make slow but steady progress following high risk coronary artery bypass grafting for critical left main disease. She has appointment with nephrology in [facility] next week and follow-up appointment with cardiology on May 19 I plan to see her back in one month with a follow-up chest x-ray     [name] MD      [address] [place] [address] Office [phone]   Beeper [phone]  11/15/2015 2:59 PM

## 2015-11-17 ENCOUNTER — Other Ambulatory Visit: Payer: Self-pay | Admitting: Nurse Practitioner

## 2015-11-22 DIAGNOSIS — E1129 Type 2 diabetes mellitus with other diabetic kidney complication: Secondary | ICD-10-CM | POA: Diagnosis not present

## 2015-11-22 DIAGNOSIS — R2689 Other abnormalities of gait and mobility: Secondary | ICD-10-CM | POA: Diagnosis not present

## 2015-11-22 DIAGNOSIS — I5033 Acute on chronic diastolic (congestive) heart failure: Secondary | ICD-10-CM | POA: Diagnosis not present

## 2015-11-22 DIAGNOSIS — Z951 Presence of aortocoronary bypass graft: Secondary | ICD-10-CM | POA: Diagnosis not present

## 2015-11-22 DIAGNOSIS — R0602 Shortness of breath: Secondary | ICD-10-CM | POA: Diagnosis not present

## 2015-11-22 DIAGNOSIS — Z48812 Encounter for surgical aftercare following surgery on the circulatory system: Secondary | ICD-10-CM | POA: Diagnosis not present

## 2015-11-23 ENCOUNTER — Encounter: Payer: Self-pay | Admitting: Cardiology

## 2015-11-23 ENCOUNTER — Telehealth: Payer: Self-pay | Admitting: *Deleted

## 2015-11-23 ENCOUNTER — Ambulatory Visit (INDEPENDENT_AMBULATORY_CARE_PROVIDER_SITE_OTHER): Payer: Medicare HMO | Admitting: Cardiology

## 2015-11-23 VITALS — BP 126/78 | HR 88 | Ht 62.0 in | Wt 180.0 lb

## 2015-11-23 DIAGNOSIS — I5042 Chronic combined systolic (congestive) and diastolic (congestive) heart failure: Secondary | ICD-10-CM | POA: Insufficient documentation

## 2015-11-23 DIAGNOSIS — J90 Pleural effusion, not elsewhere classified: Secondary | ICD-10-CM | POA: Diagnosis not present

## 2015-11-23 DIAGNOSIS — I5033 Acute on chronic diastolic (congestive) heart failure: Secondary | ICD-10-CM | POA: Diagnosis not present

## 2015-11-23 DIAGNOSIS — I10 Essential (primary) hypertension: Secondary | ICD-10-CM

## 2015-11-23 DIAGNOSIS — E785 Hyperlipidemia, unspecified: Secondary | ICD-10-CM

## 2015-11-23 DIAGNOSIS — R0602 Shortness of breath: Secondary | ICD-10-CM

## 2015-11-23 DIAGNOSIS — I509 Heart failure, unspecified: Secondary | ICD-10-CM

## 2015-11-23 DIAGNOSIS — N183 Chronic kidney disease, stage 3 unspecified: Secondary | ICD-10-CM

## 2015-11-23 DIAGNOSIS — I2510 Atherosclerotic heart disease of native coronary artery without angina pectoris: Secondary | ICD-10-CM | POA: Diagnosis not present

## 2015-11-23 DIAGNOSIS — R0600 Dyspnea, unspecified: Secondary | ICD-10-CM | POA: Insufficient documentation

## 2015-11-23 DIAGNOSIS — I5032 Chronic diastolic (congestive) heart failure: Secondary | ICD-10-CM

## 2015-11-23 LAB — COMPREHENSIVE METABOLIC PANEL
ALT: 25 U/L (ref 6–29)
AST: 24 U/L (ref 10–35)
Albumin: 3.3 g/dL — ABNORMAL LOW (ref 3.6–5.1)
Alkaline Phosphatase: 65 U/L (ref 33–130)
BUN: 37 mg/dL — ABNORMAL HIGH (ref 7–25)
CO2: 31 mmol/L (ref 20–31)
Calcium: 8.8 mg/dL (ref 8.6–10.4)
Chloride: 96 mmol/L — ABNORMAL LOW (ref 98–110)
Creat: 1.67 mg/dL — ABNORMAL HIGH (ref 0.60–0.88)
Glucose, Bld: 253 mg/dL — ABNORMAL HIGH (ref 65–99)
Potassium: 4.3 mmol/L (ref 3.5–5.3)
Sodium: 138 mmol/L (ref 135–146)
Total Bilirubin: 0.2 mg/dL (ref 0.2–1.2)
Total Protein: 6.3 g/dL (ref 6.1–8.1)

## 2015-11-23 LAB — BRAIN NATRIURETIC PEPTIDE: Brain Natriuretic Peptide: 162.6 pg/mL — ABNORMAL HIGH (ref <100)

## 2015-11-23 MED ORDER — FUROSEMIDE 20 MG PO TABS
ORAL_TABLET | ORAL | Status: DC
Start: 2015-11-23 — End: 2016-09-24

## 2015-11-23 NOTE — Telephone Encounter (Signed)
Notified the pt that per Dr [name], her creatinine is improved, and she recommends that she take an extra lasix 20 mg in the afternoon (in addition to 40 mg in the am).  Informed the pt that she should take 2 tabs (40 mg total) by mouth at 8 am, and then take 1 tab (20 mg total) by mouth at 2 pm daily. Confirmed the pharmacy of choice with the pt.  Pt verbalized understanding and agrees with this plan.

## 2015-11-23 NOTE — Progress Notes (Signed)
Cardiology Office Note:    Date:  11/23/2015   ID:  Monica [name], DOB 04-12-1931, MRN [id number]  PCP:  Benito Mccreedy, MD  Cardiologist:  Dr. Ena Dawley   Electrophysiologist:  N/a Nephrologist: Dr. Joesph [facility])  Referring MD: Benito Mccreedy, MD   No chief complaint on file.   History of Present Illness:     Monica [name] is a 82 y.o. female with a hx of HTN, HL, diabetes, asthma, GERD, gout, CKD, PUD. Admitted 3/10-3/29/2017 with unstable angina. Cardiac enzymes remained normal. Cardiac catheterization demonstrated severe 3 vessel CAD. Echocardiogram demonstrated normal LV function. She was seen by Dr. Servando Snare for cardiac surgery and underwent CABG with LIMA-LAD, SVG-intermediate, SVG-OM, SVG-distal RCA. Postoperative course was complicated by blood loss anemia requiring transfusion with PRBCs. There was some concern for aspiration due to difficulty with swallowing and she had a feeding tube for some time. She had some mental status changes as well. CVA could not be ruled out but symptoms were overall felt to be due to metabolic encephalopathy. Mental status changes improved. She had some elevation in her creatinine and was followed by nephrology. She maintained sinus rhythm. She was DC to [facility].    I saw her last week.  She had been DC from SNF 2 days prior.  She noted dyspnea that was worsening. She appeared to be volume overloaded. O2 sats were low. We arranged home O2 and adjusted her diuretics. D-dimer returned abnormal. [facility]Q scan at [facility] was low probability for pulmonary embolism. Chest x-ray did demonstrate bilateral pleural effusions. Returns for FU. Here with her son. She is feeling better. Breathing is improved. She still has difficulty laying flat. Chest discomfort is also improved. Denies any coughing or wheezing. Denies syncope. Denies any bleeding issues. Denies fever.  11/23/2015 - the patient is coming after 4 weeks, her weight is stable  she is compliant with her medicines and feels well. She knows use home oxygen 24/7 denies any orthopnea or paroxysmal nocturnal dyspnea and describes her lower extremity edema as mild and tolerable. She denies any chest pain palpitations or syncope. She is accompanied by her son  Past Medical History  Diagnosis Date  . Peptic ulcer   . Mass   . Essential hypertension   . Hyperlipidemia   . Type 2 diabetes mellitus ([place])   . Asthma   . History of blood transfusion   . CKD (chronic kidney disease), stage III   . Physical deconditioning   . Coronary artery disease involving native coronary artery of native heart without angina pectoris   . GERD (gastroesophageal reflux disease)   . Acute blood loss anemia   . Protein calorie malnutrition ([place])   . Chest wall pain   . Asthma   . Dyspnea   . Encounter for central line care   . Candida infection 10/2015    Bilateral breasts    Past Surgical History  Procedure Laterality Date  . Abdominal hysterectomy    . Colon surgery    . Cardiac catheterization N/A 09/17/2015    Procedure: Left Heart Cath and Coronary Angiography;  Surgeon: Jettie Booze, MD;  Location: [facility];  Service: Cardiovascular;  Laterality: N/A;  . Coronary artery bypass graft N/A 09/19/2015    Procedure: CORONARY ARTERY BYPASS GRAFTING (CABG) TIMES FOUR USING BILATARAL SAPHENOUS VEIN GRAFTS AND LEFT INTERNAL MAMMARY ARTERY;  Surgeon: [name], MD;  Location: [facility];  Service: Open Heart Surgery;  Laterality: N/A;  .  Tee without cardioversion N/A 09/19/2015    Procedure: TRANSESOPHAGEAL ECHOCARDIOGRAM (TEE);  Surgeon: [name], MD;  Location: [place] [facility] [facility];  Service: Open Heart Surgery;  Laterality: N/A;    Current Medications: Outpatient Prescriptions Prior to Visit  Medication Sig Dispense Refill  . albuterol (PROVENTIL HFA;VENTOLIN HFA) 108 (90 Base) MCG/ACT inhaler Inhale 2 puffs into the lungs every 6 (six) hours as needed for wheezing or  shortness of breath.    Marland Kitchen amLODipine (NORVASC) 10 MG tablet Take 10 mg by mouth daily.    Marland Kitchen aspirin EC 81 MG EC tablet Take 1 tablet (81 mg total) by mouth daily.    Marland Kitchen atorvastatin (LIPITOR) 40 MG tablet Take 1 tablet (40 mg total) by mouth daily at 6 PM.    . Calcium Carb-Cholecalciferol [id number] MG-UNIT CAPS Take 1 tablet by mouth daily.    . calcium carbonate (TUMS - DOSED IN MG ELEMENTAL CALCIUM) 500 MG chewable tablet Chew 1 tablet by mouth daily as needed for indigestion or heartburn.    . Cholecalciferol (VITAMIN D-3) 1000 units CAPS Take 1 capsule by mouth daily.    . diphenhydrAMINE (BENADRYL) 25 MG tablet Take 25 mg by mouth as needed for itching or allergies.     . ferrous sulfate 325 (65 FE) MG tablet Take 325 mg by mouth daily with breakfast.    . furosemide (LASIX) 20 MG tablet Take 2 tablets (40 mg total) by mouth daily. 60 tablet 11  . insulin glargine (LANTUS) 100 UNIT/ML injection Inject 20 Units into the skin 2 (two) times daily.     . metoprolol tartrate (LOPRESSOR) 25 MG tablet Take 1 tablet (25 mg total) by mouth 2 (two) times daily.    Marland Kitchen nystatin (MYCOSTATIN) powder Apply topically 2 (two) times daily. 100,000 units/gm powder applied under bilateral breasts BID x3 weeks     No facility-administered medications prior to visit.     Allergies:   Review of patient's allergies indicates no known allergies.   Social History   Social History  . Marital Status: Widowed    Spouse Name: N/A  . Number of Children: N/A  . Years of Education: N/A   Occupational History  . Retired    Social History Main Topics  . Smoking status: Never Smoker   . Smokeless tobacco: Former Systems developer    Types: Snuff  . Alcohol Use: No  . Drug Use: No  . Sexual Activity: Not Asked   Other Topics Concern  . None   Social History Narrative     Family History:  The patient's family history includes Heart attack in her father.   ROS:   Please see the history of present illness.    ROS All  other systems reviewed and are negative.   Physical Exam:    VS:  BP 126/78 mmHg  Pulse 88  Ht 5\' 2"  (1.575 m)  Wt 180 lb (81.647 kg)  BMI 32.91 kg/m2   GEN: Well nourished, well developed, in no acute distress HEENT: normal Neck: JVP 6-7 cm, no masses Cardiac: Normal S1/S2, RRR; [id number] systolic murmur LSB, no rubs, or gallops, 1+ bilateral LE edema to the knees Respiratory:  Decreased breath sounds bilaterally (R >L), mild rales at lung bases, no wheezing GI: soft, nontender, nondistended MS: no deformity or atrophy Skin: warm and dry Neuro: No focal deficits  Psych: Alert and oriented x 3, normal affect  Wt Readings from Last 3 Encounters:  11/23/15 180 lb (81.647 kg)  11/05/15 180 lb (  81.647 kg)  10/26/15 180 lb 12.8 oz (82.01 kg)      Studies/Labs Reviewed:     EKG:  EKG is  ordered today.  The ekg ordered today demonstrates NSR, HR 97, normal axis, low voltage, poor R-wave progression, QTc 454 ms  Recent Labs: 09/20/2015: Magnesium 2.1 09/24/2015: B Natriuretic Peptide 892.4* 09/25/2015: ALT 44; TSH 2.184 10/09/2015: Hemoglobin 9.8*; Platelets 385 10/23/2015: BUN 39*; Creat 2.05*; Potassium 5.1; Sodium 138   Recent Lipid Panel    Component Value Date/Time   CHOL 139 09/15/2015 0343   TRIG 119 09/15/2015 0343   HDL 35* 09/15/2015 0343   CHOLHDL 4.0 09/15/2015 0343   VLDL 24 09/15/2015 0343   LDLCALC 80 09/15/2015 0343    Additional studies/ records that were reviewed today include:   Dg Chest 2 View  10/19/2015  IMPRESSION: 1. Cardiac enlargement and bilateral pleural effusions right greater than left.   VQ Scan at [facility] 10/19/15 IMPRESSION: Low probability for pulmonary embolus.    Carotid US 09/18/15 Bilateral ICA 1-39%  LHC 09/17/15 LAD proximal 90%, D2 80% LCx ostial 99% RCA proximal 60%, distal 75%, RPDA 25% Severe three vessel disease. Plan for CVTS consult for CABG. She needs BP control, beta blocker and statin.  Echo 09/17/15 Mild focal basal  septal hypertrophy, vigorous LVF, EF 65-70%, normal wall motion, grade 1 diastolic dysfunction, MAC   ASSESSMENT:     1. Acute on chronic congestive heart failure, unspecified congestive heart failure type ([place])   2. Acute on chronic diastolic CHF (congestive heart failure) ([place])   3. Coronary artery disease involving native coronary artery of native heart without angina pectoris   4. Bilateral pleural effusion   5. Essential hypertension   6. Hyperlipidemia   7. Shortness of breath   8. CKD (chronic kidney disease), stage III     PLAN:     In order of problems listed above:  1. A/C Diastolic CHF -  Volume is improved And stable weight . She still has a little ways to go.  she has persistent lower extremity edema and Rales at her lung bases she feels better that's encouraging, clicks check her creatinine and electrolytes today and if stable I would increase her Lasix to continue 40 mg of Lasix in the morning and at 20 mg in the afternoon. Continue O2 for now.    2. CAD - s/p CABG.  Slow to progress.  Continue HHPT.  She will need to start cardiac rehab once her HHPT is completed.  Continue ASA, statin, beta-blocker.She is currently chest pain-free  3. HTN - Controlled.   4. HL -  Managed by PCP.  Goal LDL < 70. Continue statin.  5. CKD - Managed by Nephrology in [place].   I will check her creatinine and electrolytes today.   6. Pleural effusions - She had bilateral effusions on recent chest x-ray.Her most recent chest x-ray showed decreased in bilateral pleural effusions.   Medication Adjustments/Labs and Tests Ordered: Current medicines are reviewed at length with the patient today.  Concerns regarding medicines are outlined above.  Medication changes, Labs and Tests ordered today are outlined in the Patient Instructions noted below. Patient Instructions  Medication Instructions:   Your physician recommends that you continue on your current medications as directed. Please refer to  the Current Medication list given to you today.   Labwork:  TODAY--CMET AND BNP    Follow-Up:  6 WEEKS WITH SCOTT WEAVER PA-C, FOR HE HAS CLOSELY FOLLOWED THIS PATIENT  IN THE PAST AND DR [name] WOULD LIKE FOR THAT CONTINUITY OF CARE TO CONTINUE       If you need a refill on your cardiac medications before your next appointment, please call your pharmacy.     Signed, Ena Dawley, MD  11/23/2015 12:23 PM    [address], [place], [place]  [address] Phone: [phone]; Fax: [phone]

## 2015-11-23 NOTE — Patient Instructions (Signed)
Medication Instructions:   Your physician recommends that you continue on your current medications as directed. Please refer to the Current Medication list given to you today.   Labwork:  TODAY--CMET AND BNP    Follow-Up:  6 WEEKS WITH SCOTT WEAVER PA-C, FOR HE HAS CLOSELY FOLLOWED THIS PATIENT IN THE PAST AND DR [name] WOULD LIKE FOR THAT CONTINUITY OF CARE TO CONTINUE       If you need a refill on your cardiac medications before your next appointment, please call your pharmacy.

## 2015-11-23 NOTE — Telephone Encounter (Signed)
-----   Message from Dorothy Spark, MD sent at 11/23/2015  4:44 PM EDT ----- Her Crea is improved, please instruct her to take an extra lasix 20 mg po in the afternoon (inaddition to 40 mg po in the am), thank you

## 2015-11-26 ENCOUNTER — Telehealth: Payer: Self-pay | Admitting: Cardiology

## 2015-11-26 DIAGNOSIS — E1151 Type 2 diabetes mellitus with diabetic peripheral angiopathy without gangrene: Secondary | ICD-10-CM | POA: Diagnosis not present

## 2015-11-26 DIAGNOSIS — E559 Vitamin D deficiency, unspecified: Secondary | ICD-10-CM | POA: Diagnosis not present

## 2015-11-26 DIAGNOSIS — R1013 Epigastric pain: Secondary | ICD-10-CM | POA: Diagnosis not present

## 2015-11-26 DIAGNOSIS — E785 Hyperlipidemia, unspecified: Secondary | ICD-10-CM | POA: Diagnosis not present

## 2015-11-26 DIAGNOSIS — N183 Chronic kidney disease, stage 3 (moderate): Secondary | ICD-10-CM | POA: Diagnosis not present

## 2015-11-26 DIAGNOSIS — J45909 Unspecified asthma, uncomplicated: Secondary | ICD-10-CM | POA: Diagnosis not present

## 2015-11-26 DIAGNOSIS — K254 Chronic or unspecified gastric ulcer with hemorrhage: Secondary | ICD-10-CM | POA: Diagnosis not present

## 2015-11-26 DIAGNOSIS — I119 Hypertensive heart disease without heart failure: Secondary | ICD-10-CM | POA: Diagnosis not present

## 2015-11-26 NOTE — Telephone Encounter (Signed)
Pt and pts daughter is calling Richardson Dopp PA and CMA to request an order for a different size 02 tank for the pt.  Pts daughter states that the 02 tank that was delivered to the pt is way too big, and it hinders her from going out of the house, due to the weight of the tank.  Pts daughter was wanting to know if Nicki Reaper would fax an order to [facility] for the pt to have a smaller 02 tank, for mobility purposes.  Informed the pts daughter that I will route this message to [place] for further review, recommendation, and follow-up with the pt or daughter thereafter.  Informed the pts daughter that I will also send a message to Darlina Guys with [facility], to see if she could be of any assistance with getting the pt a smaller tank.  Both verbalized understanding and agrees with this plan.

## 2015-11-26 NOTE — Telephone Encounter (Signed)
New Message  Pt has received oxygen tank from [facility] and its is too heavy/large. Pt daughter request a call back to discuss retrieving a smaller tank for mobility.

## 2015-11-27 NOTE — Telephone Encounter (Signed)
Ok to order smaller tank Can we call [facility] to get this arranged? Thanks, Richardson Dopp, PA-C   11/27/2015 10:42 PM

## 2015-11-28 NOTE — Telephone Encounter (Signed)
I tried and this is the message Jiles Crocker with [facility] sent in a staff message:  We would need to get a script written for a [facility] for best fit. We will test the pt for the best fitting equipment that meets her needs. Thanks!   Not sure how to do this, but would be glad to assist in any way possible.

## 2015-11-28 NOTE — Telephone Encounter (Signed)
Just write that on a Rx pad and I can sign and we can fax to him. Richardson Dopp, PA-C   11/28/2015 11:00 AM

## 2015-11-29 ENCOUNTER — Telehealth: Payer: Self-pay | Admitting: Cardiology

## 2015-11-29 NOTE — Telephone Encounter (Signed)
New message     Talk to the nurse regarding order for oxygen being [facility] to replace the current oxygen tank

## 2015-11-29 NOTE — Telephone Encounter (Signed)
Informed the Daughter that this order was faxed earlier today and was signed by Richardson Dopp PA-C.  Informed the Daughter that [facility] should be following up with the daughter and pt here soon to arrange for Portal Eval for best fit of 02 tank.  This was sent to [facility] and Attn to their Respiratory Dept.  Daughter verbalized understanding and gracious for all the assistance provided.

## 2015-11-29 NOTE — Telephone Encounter (Signed)
Informed the Daughter that this order was faxed earlier today and was signed by Richardson Dopp PA-C.  Informed the Daughter that [facility] should be following up with the daughter and pt here soon to arrange for Portal Eval for best fit of 02 tank.  This was sent to [facility] and Attn to their Respiratory Dept.  Daughter verbalized understanding and gracious for all the assistance provided.

## 2015-12-04 DIAGNOSIS — N189 Chronic kidney disease, unspecified: Secondary | ICD-10-CM | POA: Diagnosis not present

## 2015-12-04 DIAGNOSIS — I10 Essential (primary) hypertension: Secondary | ICD-10-CM | POA: Diagnosis not present

## 2015-12-04 DIAGNOSIS — R809 Proteinuria, unspecified: Secondary | ICD-10-CM | POA: Diagnosis not present

## 2015-12-04 DIAGNOSIS — E119 Type 2 diabetes mellitus without complications: Secondary | ICD-10-CM | POA: Diagnosis not present

## 2015-12-04 DIAGNOSIS — N184 Chronic kidney disease, stage 4 (severe): Secondary | ICD-10-CM | POA: Diagnosis not present

## 2015-12-06 ENCOUNTER — Telehealth: Payer: Self-pay | Admitting: *Deleted

## 2015-12-06 NOTE — Telephone Encounter (Signed)
I received fax (Physician Interim Order) from [place]. Looks like this is asking for the ICD 10 code from a bmet 10/30/15 that PA v/o ordered. This form has already been filled out x 2. I lmom for HHRN tcb to confirm.

## 2015-12-10 ENCOUNTER — Other Ambulatory Visit: Payer: Self-pay | Admitting: Physician Assistant

## 2015-12-12 ENCOUNTER — Other Ambulatory Visit: Payer: Self-pay | Admitting: Cardiothoracic Surgery

## 2015-12-12 DIAGNOSIS — Z951 Presence of aortocoronary bypass graft: Secondary | ICD-10-CM

## 2015-12-13 ENCOUNTER — Encounter: Payer: Self-pay | Admitting: Cardiothoracic Surgery

## 2015-12-13 ENCOUNTER — Ambulatory Visit
Admission: RE | Admit: 2015-12-13 | Discharge: 2015-12-13 | Disposition: A | Discharge status: Home / Self Care | Payer: Commercial Managed Care - HMO | Source: Ambulatory Visit | Attending: Cardiothoracic Surgery | Admitting: Cardiothoracic Surgery

## 2015-12-13 ENCOUNTER — Ambulatory Visit (INDEPENDENT_AMBULATORY_CARE_PROVIDER_SITE_OTHER): Payer: Self-pay | Admitting: Cardiothoracic Surgery

## 2015-12-13 VITALS — BP 157/74 | HR 79 | Resp 16 | Ht 62.0 in | Wt 173.3 lb

## 2015-12-13 DIAGNOSIS — Z951 Presence of aortocoronary bypass graft: Secondary | ICD-10-CM

## 2015-12-13 DIAGNOSIS — J90 Pleural effusion, not elsewhere classified: Secondary | ICD-10-CM

## 2015-12-13 NOTE — Progress Notes (Signed)
[address]       [place] [address]             [phone]      Joby Pell Pink Medical Record [id number] Date of Birth: [birth year]  Referring: Jettie Booze, MD Primary Care: Benito Mccreedy, MD  Chief Complaint:   POST OP FOLLOW UP 09/20/2015  OPERATIVE REPORT PREOPERATIVE DIAGNOSIS: Critical three-vessel coronary artery disease with unstable angina. POSTOPERATIVE DIAGNOSIS: Critical three-vessel coronary artery disease with unstable angina. SURGICAL PROCEDURE: Coronary artery bypass grafting x4, urgent coronary artery bypass grafting x4 with the left internal mammary to the left anterior descending coronary artery, reverse saphenous vein graft to the intermediate coronary artery, reverse saphenous vein graft to the obtuse marginal coronary artery, reverse saphenous vein graft to the distal right coronary artery with left greater saphenous thigh and calf endo- vein harvesting, and right thigh greater saphenous endo-vein harvesting. SURGEON: Lanelle Bal, M.D.  History of Present Illness:     Patient continues to slowly improve since seen 2 weeks ago she's had bilateral thoracentesis done . She continues to increase her physical activity she's noted decreased edema in the lower extremities over the past 2 weeks. She continues on intermittent oxygen as ordered by the cardiology office. Since last seen she has had follow-up visit with nephrology and continues on Lasix 20 mg twice a day.    Past Medical History  Diagnosis Date  . Peptic ulcer   . Mass   . Essential hypertension   . Hyperlipidemia   . Type 2 diabetes mellitus ([place])   . Asthma   . History of blood transfusion   . CKD (chronic kidney disease), stage III   . Physical deconditioning   . Coronary artery disease involving native coronary artery of native heart without angina pectoris   . GERD (gastroesophageal reflux disease)   . Acute blood loss anemia   .  Protein calorie malnutrition ([place])   . Chest wall pain   . Asthma   . Dyspnea   . Encounter for central line care   . Candida infection 10/2015    Bilateral breasts     History  Smoking status  . Never Smoker   Smokeless tobacco  . Former Systems developer  . Types: Snuff    History  Alcohol Use No     No Known Allergies  Current Outpatient Prescriptions  Medication Sig Dispense Refill  . albuterol (PROVENTIL HFA;VENTOLIN HFA) 108 (90 Base) MCG/ACT inhaler Inhale 2 puffs into the lungs every 6 (six) hours as needed for wheezing or shortness of breath.    Marland Kitchen amLODipine (NORVASC) 10 MG tablet Take 10 mg by mouth daily.    Marland Kitchen aspirin EC 81 MG EC tablet Take 1 tablet (81 mg total) by mouth daily.    . Calcium Carb-Cholecalciferol [phone] MG-UNIT CAPS Take 1 tablet by mouth daily.    . calcium carbonate (TUMS - DOSED IN MG ELEMENTAL CALCIUM) 500 MG chewable tablet Chew 1 tablet by mouth daily as needed for indigestion or heartburn.    . Cholecalciferol (VITAMIN D-3) 1000 units CAPS Take 1 capsule by mouth daily.    . diphenhydrAMINE (BENADRYL) 25 MG tablet Take 25 mg by mouth as needed for itching or allergies.     . ferrous sulfate 325 (65 FE) MG tablet Take 325 mg by mouth daily with breakfast.    . furosemide (LASIX) 20 MG tablet Take 2 tabs (40 mg total) by mouth at 8 am,  and then take 1 tab (20 mg total) by mouth at 2 pm daily 270 tablet 3  . insulin glargine (LANTUS) 100 UNIT/ML injection Inject 20 Units into the skin 2 (two) times daily.     . metoprolol tartrate (LOPRESSOR) 25 MG tablet Take 1 tablet (25 mg total) by mouth 2 (two) times daily.    Marland Kitchen nystatin (MYCOSTATIN) powder Apply topically 2 (two) times daily. 100,000 units/gm powder applied under bilateral breasts BID x3 weeks    . atorvastatin (LIPITOR) 40 MG tablet Take 1 tablet (40 mg total) by mouth daily at 6 PM. (Patient taking differently: Take 80 mg by mouth daily at 6 PM. )     No current facility-administered medications for  this visit.       Physical Exam: BP 157/74 mmHg  Pulse 79  Resp 16  Ht 5\' 2"  (1.575 m)  Wt 173 lb 4.8 oz (78.608 kg)  BMI 31.69 kg/m2  SpO2 98% On 2 L of oxygen General appearance: alert, cooperative, appears stated age and no distress Neurologic: intact Heart: regular rate and rhythm, S1, S2 normal, no murmur, click, rub or gallop Lungs: diminished breath sounds bibasilar, left greater than right Abdomen: soft, non-tender; bowel sounds normal; no masses,  no organomegaly Extremities: extremities normal, atraumatic, no cyanosis or Homans sign is negative, no sign of DVT, patient has continued pedal edema bilaterally though this is improved from her visit 2 weeks ago ondiuretic therapy Wound: Sternum is stable and well healed   Diagnostic Studies & Laboratory data:     Recent Radiology Findings:   Dg Chest 2 View  12/13/2015  CLINICAL DATA:  Status post CABG on September 19, 2015, follow-up study; history of asthma and diabetes EXAM: CHEST  2 VIEW COMPARISON:  Chest x-ray of Nov 15, 2015 FINDINGS: There has been interval decrease in the small right pleural effusion and right basilar atelectasis. Minimal blunting of the right lateral and posterior costophrenic angles remain. On the left a moderate-sized pleural effusion persists and is slightly larger. There is no pneumothorax. The cardiac silhouette remains enlarged. The pulmonary vascularity remains mildly prominent centrally. The retrosternal soft tissues are normal. There is multilevel degenerative disc disease of the thoracic spine. IMPRESSION: Slight interval increase in volume of the left pleural effusion since the previous study. Interval improvement in the right pleural effusion and right basilar atelectasis. Stable cardiomegaly without pulmonary edema. Electronically Signed   By: David  Martinique M.D.   On: 12/13/2015 13:17      Recent Lab Findings: Lab Results  Component Value Date   WBC 9.1 10/09/2015   HGB 9.8* 10/09/2015    HCT 32* 10/09/2015   PLT 385 10/09/2015   GLUCOSE 253* 11/23/2015   CHOL 139 09/15/2015   TRIG 119 09/15/2015   HDL 35* 09/15/2015   LDLCALC 80 09/15/2015   ALT 25 11/23/2015   AST 24 11/23/2015   NA 138 11/23/2015   K 4.3 11/23/2015   CL 96* 11/23/2015   CREATININE 1.67* 11/23/2015   BUN 37* 11/23/2015   CO2 31 11/23/2015   TSH 2.184 09/25/2015   INR 1.53* 09/19/2015   HGBA1C 7.6 10/09/2015      Assessment / Plan:      Patient now status post bilateral thoracentesis with improved improvement in her respiratory status and her functional status proximal , 600 mL ML's was draining from one side and 400 from the other. Overall she continues to make slow but steady progress following high risk coronary artery bypass  grafting for critical left main disease.  I plan to see her back in 6 weeks with a follow-up chest x-ray     [name] MD      [address] [place] [address] Office [phone]   Beeper [phone]  12/13/2015 2:01 PM

## 2015-12-13 NOTE — Progress Notes (Signed)
[address]       [place] [address]             [phone]      Reeta Rauh [place] Medical Record [id number] Date of Birth: [birth year]  Referring: Jettie Booze, MD Primary Care: Benito Mccreedy, MD  Chief Complaint:   POST OP FOLLOW UP 09/20/2015  OPERATIVE REPORT PREOPERATIVE DIAGNOSIS: Critical three-vessel coronary artery disease with unstable angina. POSTOPERATIVE DIAGNOSIS: Critical three-vessel coronary artery disease with unstable angina. SURGICAL PROCEDURE: Coronary artery bypass grafting x4, urgent coronary artery bypass grafting x4 with the left internal mammary to the left anterior descending coronary artery, reverse saphenous vein graft to the intermediate coronary artery, reverse saphenous vein graft to the obtuse marginal coronary artery, reverse saphenous vein graft to the distal right coronary artery with left greater saphenous thigh and calf endo- vein harvesting, and right thigh greater saphenous endo-vein harvesting. SURGEON: Lanelle Bal, M.D.  History of Present Illness:     Patient continues to slowly improve since seen 2 weeks ago she's had bilateral thoracentesis done for 100 drain from one side 600 from the other. She continues to increase her physical activity she's noted decreased edema in the lower extremities over the past 2 weeks.     Past Medical History  Diagnosis Date  . Peptic ulcer   . Mass   . Essential hypertension   . Hyperlipidemia   . Type 2 diabetes mellitus ([place])   . Asthma   . History of blood transfusion   . CKD (chronic kidney disease), stage III   . Physical deconditioning   . Coronary artery disease involving native coronary artery of native heart without angina pectoris   . GERD (gastroesophageal reflux disease)   . Acute blood loss anemia   . Protein calorie malnutrition ([place])   . Chest wall pain   . Asthma   . Dyspnea   . Encounter for central line care   . Candida  infection 10/2015    Bilateral breasts     History  Smoking status  . Never Smoker   Smokeless tobacco  . Former Systems developer  . Types: Snuff    History  Alcohol Use No     No Known Allergies  Current Outpatient Prescriptions  Medication Sig Dispense Refill  . albuterol (PROVENTIL HFA;VENTOLIN HFA) 108 (90 Base) MCG/ACT inhaler Inhale 2 puffs into the lungs every 6 (six) hours as needed for wheezing or shortness of breath.    Marland Kitchen amLODipine (NORVASC) 10 MG tablet Take 10 mg by mouth daily.    Marland Kitchen aspirin EC 81 MG EC tablet Take 1 tablet (81 mg total) by mouth daily.    . Calcium Carb-Cholecalciferol [phone] MG-UNIT CAPS Take 1 tablet by mouth daily.    . calcium carbonate (TUMS - DOSED IN MG ELEMENTAL CALCIUM) 500 MG chewable tablet Chew 1 tablet by mouth daily as needed for indigestion or heartburn.    . Cholecalciferol (VITAMIN D-3) 1000 units CAPS Take 1 capsule by mouth daily.    . diphenhydrAMINE (BENADRYL) 25 MG tablet Take 25 mg by mouth as needed for itching or allergies.     . ferrous sulfate 325 (65 FE) MG tablet Take 325 mg by mouth daily with breakfast.    . furosemide (LASIX) 20 MG tablet Take 2 tabs (40 mg total) by mouth at 8 am, and then take 1 tab (20 mg total) by mouth at 2 pm daily 270 tablet 3  . insulin  glargine (LANTUS) 100 UNIT/ML injection Inject 20 Units into the skin 2 (two) times daily.     . metoprolol tartrate (LOPRESSOR) 25 MG tablet Take 1 tablet (25 mg total) by mouth 2 (two) times daily.    Marland Kitchen nystatin (MYCOSTATIN) powder Apply topically 2 (two) times daily. 100,000 units/gm powder applied under bilateral breasts BID x3 weeks    . atorvastatin (LIPITOR) 40 MG tablet Take 1 tablet (40 mg total) by mouth daily at 6 PM. (Patient taking differently: Take 80 mg by mouth daily at 6 PM. )     No current facility-administered medications for this visit.       Physical Exam: BP 157/74 mmHg  Pulse 79  Resp 16  Ht 5\' 2"  (1.575 m)  Wt 173 lb 4.8 oz (78.608 kg)   BMI 31.69 kg/m2  SpO2 98% On 2 L of oxygen General appearance: alert, cooperative, appears stated age and no distress Neurologic: intact Heart: regular rate and rhythm, S1, S2 normal, no murmur, click, rub or gallop Lungs: diminished breath sounds bibasilar Abdomen: soft, non-tender; bowel sounds normal; no masses,  no organomegaly Extremities: extremities normal, atraumatic, no cyanosis or Homans sign is negative, no sign of DVT, patient has continued pedal edema bilaterally though this is improved from her visit 2 weeks ago ondiuretic therapy Wound: Sternum is stable and well healed   Diagnostic Studies & Laboratory data:     Recent Radiology Findings:   Dg Chest 2 View  12/13/2015  CLINICAL DATA:  Status post CABG on September 19, 2015, follow-up study; history of asthma and diabetes EXAM: CHEST  2 VIEW COMPARISON:  Chest x-ray of Nov 15, 2015 FINDINGS: There has been interval decrease in the small right pleural effusion and right basilar atelectasis. Minimal blunting of the right lateral and posterior costophrenic angles remain. On the left a moderate-sized pleural effusion persists and is slightly larger. There is no pneumothorax. The cardiac silhouette remains enlarged. The pulmonary vascularity remains mildly prominent centrally. The retrosternal soft tissues are normal. There is multilevel degenerative disc disease of the thoracic spine. IMPRESSION: Slight interval increase in volume of the left pleural effusion since the previous study. Interval improvement in the right pleural effusion and right basilar atelectasis. Stable cardiomegaly without pulmonary edema. Electronically Signed   By: David  Martinique M.D.   On: 12/13/2015 13:17      Recent Lab Findings: Lab Results  Component Value Date   WBC 9.1 10/09/2015   HGB 9.8* 10/09/2015   HCT 32* 10/09/2015   PLT 385 10/09/2015   GLUCOSE 253* 11/23/2015   CHOL 139 09/15/2015   TRIG 119 09/15/2015   HDL 35* 09/15/2015   LDLCALC 80  09/15/2015   ALT 25 11/23/2015   AST 24 11/23/2015   NA 138 11/23/2015   K 4.3 11/23/2015   CL 96* 11/23/2015   CREATININE 1.67* 11/23/2015   BUN 37* 11/23/2015   CO2 31 11/23/2015   TSH 2.184 09/25/2015   INR 1.53* 09/19/2015   HGBA1C 7.6 10/09/2015      Assessment / Plan:      Patient now status post bilateral thoracentesis with improved improvement in her respiratory status and her functional status proximal , 600 mL ML's was draining from one side and 400 from the other Overall she continues to make slow but steady progress following high risk coronary artery bypass grafting for critical left main disease. She is followed  with nephrology in [facility] I plan to see her back in one month  with a follow-up chest x-ray     [name] MD      [address] [place] [address] Office [phone]   Beeper [phone]  12/13/2015 2:03 PM

## 2015-12-17 ENCOUNTER — Other Ambulatory Visit: Payer: Self-pay

## 2015-12-17 MED ORDER — METOPROLOL TARTRATE 25 MG PO TABS: 25.0000 mg | ORAL_TABLET | Freq: Two times a day (BID) | ORAL | Status: DC

## 2015-12-18 ENCOUNTER — Telehealth: Payer: Self-pay | Admitting: Cardiology

## 2015-12-18 NOTE — Telephone Encounter (Signed)
Follow Up   Pt daughter returned call. Pt daughter states that the pt didn't understand the first call and request a call back for clarification. Please call

## 2015-12-18 NOTE — Telephone Encounter (Signed)
Spoke with Lucy Chris in billing, and she will be calling the pts daughter to discuss the pt needing to see her PCP or obtaining a referral from her PCP, for the pt to see Richardson Dopp PA-C on 6/26, for follow-up per Dr [name].  The referral is needed, because the pt switched her insurance to [facility], which requires this.  Suanne Marker will call the pts daughter and discuss this referral issue in more depth.

## 2015-12-20 DIAGNOSIS — E119 Type 2 diabetes mellitus without complications: Secondary | ICD-10-CM | POA: Diagnosis not present

## 2015-12-20 DIAGNOSIS — I10 Essential (primary) hypertension: Secondary | ICD-10-CM | POA: Diagnosis not present

## 2015-12-20 DIAGNOSIS — Z6833 Body mass index (BMI) 33.0-33.9, adult: Secondary | ICD-10-CM | POA: Diagnosis not present

## 2015-12-20 DIAGNOSIS — Z951 Presence of aortocoronary bypass graft: Secondary | ICD-10-CM | POA: Diagnosis not present

## 2015-12-20 DIAGNOSIS — E785 Hyperlipidemia, unspecified: Secondary | ICD-10-CM | POA: Diagnosis not present

## 2015-12-23 DIAGNOSIS — I5033 Acute on chronic diastolic (congestive) heart failure: Secondary | ICD-10-CM | POA: Diagnosis not present

## 2015-12-23 DIAGNOSIS — R0602 Shortness of breath: Secondary | ICD-10-CM | POA: Diagnosis not present

## 2015-12-23 DIAGNOSIS — Z951 Presence of aortocoronary bypass graft: Secondary | ICD-10-CM | POA: Diagnosis not present

## 2015-12-23 DIAGNOSIS — E1129 Type 2 diabetes mellitus with other diabetic kidney complication: Secondary | ICD-10-CM | POA: Diagnosis not present

## 2015-12-23 DIAGNOSIS — Z48812 Encounter for surgical aftercare following surgery on the circulatory system: Secondary | ICD-10-CM | POA: Diagnosis not present

## 2015-12-24 DIAGNOSIS — E785 Hyperlipidemia, unspecified: Secondary | ICD-10-CM | POA: Diagnosis not present

## 2015-12-24 DIAGNOSIS — R1013 Epigastric pain: Secondary | ICD-10-CM | POA: Diagnosis not present

## 2015-12-24 DIAGNOSIS — I119 Hypertensive heart disease without heart failure: Secondary | ICD-10-CM | POA: Diagnosis not present

## 2015-12-24 DIAGNOSIS — E559 Vitamin D deficiency, unspecified: Secondary | ICD-10-CM | POA: Diagnosis not present

## 2015-12-24 DIAGNOSIS — E1151 Type 2 diabetes mellitus with diabetic peripheral angiopathy without gangrene: Secondary | ICD-10-CM | POA: Diagnosis not present

## 2015-12-24 DIAGNOSIS — N183 Chronic kidney disease, stage 3 (moderate): Secondary | ICD-10-CM | POA: Diagnosis not present

## 2015-12-24 DIAGNOSIS — K254 Chronic or unspecified gastric ulcer with hemorrhage: Secondary | ICD-10-CM | POA: Diagnosis not present

## 2015-12-24 DIAGNOSIS — J45909 Unspecified asthma, uncomplicated: Secondary | ICD-10-CM | POA: Diagnosis not present

## 2015-12-30 NOTE — Progress Notes (Signed)
Cardiology Office Note:    Date:  12/31/2015   ID:  Monica [name], DOB [birth year], MRN [id number]  PCP:  Benito Mccreedy, MD  Cardiologist:  Dr. Ena Dawley   Electrophysiologist:  N/a Nephrologist: Dr. Joesph July [facility])  Referring MD: Benito Mccreedy, MD   Chief Complaint  Patient presents with  . Congestive Heart Failure    follow up    History of Present Illness:     Monica [name] is a 82 y.o. female with a hx of CAD s/p CABG in 3/17, HTN, HL, diabetes, asthma, GERD, gout, CKD, PUD. Admitted in 3/17 with unstable angina. LHC demonstrated severe 3 vessel CAD and she underwent CABG with LIMA-LAD, SVG-intermediate, SVG-OM, SVG-distal RCA. Postoperative course was complicated by blood loss anemia requiring transfusion with PRBCs.  Since her surgery, she has undergone bilateral thoracenteses for persistent pleural effusions.  Last seen by Dr. Servando Snare 12/13/15.    She returns for FU.  She is here with her son.  She is due to start cardiac rehab soon.  She is doing well.  She denies chest discomfort, orthopnea, PND, cough, wheeze.  She denies significant DOE.  She has chronic LE edema that is overall stable.     Past Medical History  Diagnosis Date  . Peptic ulcer   . Mass   . Essential hypertension   . Hyperlipidemia   . Type 2 diabetes mellitus ([place])   . Asthma   . History of blood transfusion   . CKD (chronic kidney disease), stage III   . Coronary artery disease involving native coronary artery of native heart without angina pectoris     a. LHC 3/17 >> 3 v CAD >> CABG  . GERD (gastroesophageal reflux disease)   . Protein calorie malnutrition ([place])   . Candida infection 10/2015    Bilateral breasts  . History of echocardiogram     a. Echo 3/17: Mild focal basal septal hypertrophy, vigorous LVF, EF 65-70%, normal wall motion, grade 1 diastolic dysfunction, MAC  . Chronic diastolic CHF (congestive heart failure) ([place])   . Bilateral pleural effusion     a. s/p CABG >> s/p  bilat thoracentesis 11/2015    Past Surgical History  Procedure Laterality Date  . Abdominal hysterectomy    . Colon surgery    . Cardiac catheterization N/A 09/17/2015    Procedure: Left Heart Cath and Coronary Angiography;  Surgeon: Jettie Booze, MD;  Location: [facility];  Service: Cardiovascular;  Laterality: N/A;  . Coronary artery bypass graft N/A 09/19/2015    Procedure: CORONARY ARTERY BYPASS GRAFTING (CABG) TIMES FOUR USING BILATARAL SAPHENOUS VEIN GRAFTS AND LEFT INTERNAL MAMMARY ARTERY;  Surgeon: [name], MD;  Location: [facility];  Service: Open Heart Surgery;  Laterality: N/A;  . Tee without cardioversion N/A 09/19/2015    Procedure: TRANSESOPHAGEAL ECHOCARDIOGRAM (TEE);  Surgeon: [name], MD;  Location: [facility];  Service: Open Heart Surgery;  Laterality: N/A;    Current Medications: Outpatient Prescriptions Prior to Visit  Medication Sig Dispense Refill  . albuterol (PROVENTIL HFA;VENTOLIN HFA) 108 (90 Base) MCG/ACT inhaler Inhale 2 puffs into the lungs every 6 (six) hours as needed for wheezing or shortness of breath.    Marland Kitchen amLODipine (NORVASC) 10 MG tablet Take 10 mg by mouth daily.    Marland Kitchen aspirin EC 81 MG EC tablet Take 1 tablet (81 mg total) by mouth daily.    . Calcium Carb-Cholecalciferol [phone] MG-UNIT CAPS Take 1 tablet by mouth daily.    Marland Kitchen  calcium carbonate (TUMS - DOSED IN MG ELEMENTAL CALCIUM) 500 MG chewable tablet Chew 1 tablet by mouth daily as needed for indigestion or heartburn.    . Cholecalciferol (VITAMIN D-3) 1000 units CAPS Take 1 capsule by mouth daily.    . diphenhydrAMINE (BENADRYL) 25 MG tablet Take 25 mg by mouth as needed for itching or allergies.     . ferrous sulfate 325 (65 FE) MG tablet Take 325 mg by mouth daily with breakfast.    . furosemide (LASIX) 20 MG tablet Take 2 tabs (40 mg total) by mouth at 8 am, and then take 1 tab (20 mg total) by mouth at 2 pm daily 270 tablet 3  . insulin glargine (LANTUS) 100 UNIT/ML  injection INJECT 15 UNITS INTO THE SKIN IN THE MORNING THAN 10 UNITS INTO THE SKIN AT NIGHT BEFORE BEDTIME    . metoprolol tartrate (LOPRESSOR) 25 MG tablet Take 1 tablet (25 mg total) by mouth 2 (two) times daily. 180 tablet 3  . nystatin (MYCOSTATIN) powder Apply topically 2 (two) times daily. 100,000 units/gm powder applied under bilateral breasts BID x3 weeks    . atorvastatin (LIPITOR) 40 MG tablet Take 1 tablet (40 mg total) by mouth daily at 6 PM. (Patient not taking: Reported on 12/31/2015)     No facility-administered medications prior to visit.      Allergies:   Review of patient's allergies indicates no known allergies.   Social History   Social History  . Marital Status: Widowed    Spouse Name: N/A  . Number of Children: N/A  . Years of Education: N/A   Occupational History  . Retired    Social History Main Topics  . Smoking status: Never Smoker   . Smokeless tobacco: Former Systems developer    Types: Snuff  . Alcohol Use: No  . Drug Use: No  . Sexual Activity: Not Asked   Other Topics Concern  . None   Social History Narrative     Family History:  The patient's family history includes Heart attack in her father.   ROS:   Please see the history of present illness.    Review of Systems  Cardiovascular: Positive for leg swelling.  Neurological: Positive for loss of balance.   All other systems reviewed and are negative.   Physical Exam:    VS:  BP 142/58 mmHg  Pulse 74  Ht 5\' 2"  (1.575 m)  Wt 170 lb (77.111 kg)  BMI 31.09 kg/m2   Physical Exam  Constitutional: She is oriented to person, place, and time. She appears well-developed and well-nourished.  HENT:  Head: Normocephalic and atraumatic.  Neck: Neck supple. No JVD present.  Cardiovascular: Normal rate, regular rhythm and normal heart sounds.   No murmur heard. Pulmonary/Chest: Effort normal and breath sounds normal. She has no wheezes. She has no rales.  Abdominal: Soft. There is no tenderness.    Musculoskeletal:  1+ bilateral LE edema - compression stockings in place  Neurological: She is alert and oriented to person, place, and time.  Skin: Skin is warm and dry.  Psychiatric: She has a normal mood and affect.    Wt Readings from Last 3 Encounters:  12/31/15 170 lb (77.111 kg)  12/13/15 173 lb 4.8 oz (78.608 kg)  11/23/15 180 lb (81.647 kg)      Studies/Labs Reviewed:     EKG:  EKG is  ordered today.  The ekg ordered today demonstrates NSR, HR 74, normal axis, PRWP, QTc 472 ms, no  sig changes.   Recent Labs: 09/20/2015: Magnesium 2.1 09/25/2015: TSH 2.184 10/09/2015: Hemoglobin 9.8*; Platelets 385 11/23/2015: ALT 25; Brain Natriuretic Peptide 162.6*; BUN 37*; Creat 1.67*; Potassium 4.3; Sodium 138   Recent Lipid Panel    Component Value Date/Time   CHOL 139 09/15/2015 0343   TRIG 119 09/15/2015 0343   HDL 35* 09/15/2015 0343   CHOLHDL 4.0 09/15/2015 0343   VLDL 24 09/15/2015 0343   LDLCALC 80 09/15/2015 0343    Additional studies/ records that were reviewed today include:   Carotid US 09/18/15 Bilateral ICA 1-39%  LHC 09/17/15 LAD proximal 90%, D2 80% LCx ostial 99% RCA proximal 60%, distal 75%, RPDA 25% Severe three vessel disease. Plan for CVTS consult for CABG. She needs BP control, beta blocker and statin.  Echo 09/17/15 Mild focal basal septal hypertrophy, vigorous LVF, EF 65-70%, normal wall motion, grade 1 diastolic dysfunction, MAC   ASSESSMENT:     1. Chronic diastolic CHF (congestive heart failure) ([place])   2. Coronary artery disease involving native coronary artery of native heart without angina pectoris   3. Essential hypertension   4. Hyperlipidemia   5. CKD (chronic kidney disease), stage III     PLAN:     In order of problems listed above:  1. Chronic Diastolic CHF - Volume is stable.  She has had persistent effusions and is s/p bilateral thoracenteses.  She sees. Dr. Servando Snare back next month with repeat CXR.  Continue current regimen  of Lasix 40 mg in A and 20 mg in P. Her nephrologist recently checked her BMET and the patient notes no changes were made.     2. CAD - s/p CABG. I have encouraged her to start cardiac rehab. Continue ASA, statin, beta-blocker.  3. HTN - Fair control.  Continue current regimen of Amlodipine, Metoprolol, Furosemide.  Continue to monitor.    4. HL - Managed by PCP. LDL in 3/17 was 80.  Continue statin.  5. CKD - Managed by Nephrology in [place].   Medication Adjustments/Labs and Tests Ordered: Current medicines are reviewed at length with the patient today.  Concerns regarding medicines are outlined above.  Medication changes, Labs and Tests ordered today are outlined in the Patient Instructions noted below. Patient Instructions  Medication Instructions:  Your physician recommends that you continue on your current medications as directed. Please refer to the Current Medication list given to you today. Labwork: NONE Testing/Procedures: NONE Follow-Up: 04/03/16 @ 9 AM WITH DR. [name] Any Other Special Instructions Will Be Listed Below (If Applicable). If you need a refill on your cardiac medications before your next appointment, please call your pharmacy.   Signed, Richardson Dopp, PA-C  12/31/2015 11:47 AM    [facility] [address], [place], [place]  [address] Phone: [phone]; Fax: [phone]

## 2015-12-31 ENCOUNTER — Ambulatory Visit (INDEPENDENT_AMBULATORY_CARE_PROVIDER_SITE_OTHER): Payer: Commercial Managed Care - HMO | Admitting: Physician Assistant

## 2015-12-31 ENCOUNTER — Encounter: Payer: Self-pay | Admitting: Physician Assistant

## 2015-12-31 VITALS — BP 142/58 | HR 74 | Ht 62.0 in | Wt 170.0 lb

## 2015-12-31 DIAGNOSIS — I2510 Atherosclerotic heart disease of native coronary artery without angina pectoris: Secondary | ICD-10-CM | POA: Diagnosis not present

## 2015-12-31 DIAGNOSIS — I5032 Chronic diastolic (congestive) heart failure: Secondary | ICD-10-CM | POA: Diagnosis not present

## 2015-12-31 DIAGNOSIS — N183 Chronic kidney disease, stage 3 unspecified: Secondary | ICD-10-CM

## 2015-12-31 DIAGNOSIS — I10 Essential (primary) hypertension: Secondary | ICD-10-CM

## 2015-12-31 DIAGNOSIS — E785 Hyperlipidemia, unspecified: Secondary | ICD-10-CM

## 2015-12-31 NOTE — Patient Instructions (Addendum)
Medication Instructions:  Your physician recommends that you continue on your current medications as directed. Please refer to the Current Medication list given to you today. Labwork: NONE Testing/Procedures: NONE Follow-Up: 04/03/16 @ 9 AM WITH DR. [name] Any Other Special Instructions Will Be Listed Below (If Applicable). If you need a refill on your cardiac medications before your next appointment, please call your pharmacy.

## 2016-01-22 DIAGNOSIS — E1129 Type 2 diabetes mellitus with other diabetic kidney complication: Secondary | ICD-10-CM | POA: Diagnosis not present

## 2016-01-22 DIAGNOSIS — I5033 Acute on chronic diastolic (congestive) heart failure: Secondary | ICD-10-CM | POA: Diagnosis not present

## 2016-01-22 DIAGNOSIS — R0602 Shortness of breath: Secondary | ICD-10-CM | POA: Diagnosis not present

## 2016-01-22 DIAGNOSIS — Z48812 Encounter for surgical aftercare following surgery on the circulatory system: Secondary | ICD-10-CM | POA: Diagnosis not present

## 2016-01-22 DIAGNOSIS — Z951 Presence of aortocoronary bypass graft: Secondary | ICD-10-CM | POA: Diagnosis not present

## 2016-01-23 ENCOUNTER — Other Ambulatory Visit: Payer: Self-pay | Admitting: Cardiothoracic Surgery

## 2016-01-23 DIAGNOSIS — Z951 Presence of aortocoronary bypass graft: Secondary | ICD-10-CM

## 2016-01-24 ENCOUNTER — Encounter: Payer: Self-pay | Admitting: Cardiothoracic Surgery

## 2016-01-24 ENCOUNTER — Ambulatory Visit
Admission: RE | Admit: 2016-01-24 | Discharge: 2016-01-24 | Disposition: A | Discharge status: Home / Self Care | Payer: Commercial Managed Care - HMO | Source: Ambulatory Visit | Attending: Cardiothoracic Surgery | Admitting: Cardiothoracic Surgery

## 2016-01-24 ENCOUNTER — Ambulatory Visit (INDEPENDENT_AMBULATORY_CARE_PROVIDER_SITE_OTHER): Payer: Commercial Managed Care - HMO | Admitting: Cardiothoracic Surgery

## 2016-01-24 VITALS — BP 152/76 | HR 77 | Resp 16 | Ht 62.0 in | Wt 170.0 lb

## 2016-01-24 DIAGNOSIS — J90 Pleural effusion, not elsewhere classified: Secondary | ICD-10-CM

## 2016-01-24 DIAGNOSIS — Z951 Presence of aortocoronary bypass graft: Secondary | ICD-10-CM

## 2016-01-24 NOTE — Progress Notes (Signed)
[address]       [place] [address]             [phone]      Kiwanna Bodin [facility] Medical Record [id number] Date of Birth: [birth year]  Referring: Jettie Booze, MD Primary Care: Benito Mccreedy, MD Primary cardiologist: Dr. [name]  Chief Complaint:   POST OP FOLLOW UP 09/20/2015  OPERATIVE REPORT PREOPERATIVE DIAGNOSIS: Critical three-vessel coronary artery disease with unstable angina. POSTOPERATIVE DIAGNOSIS: Critical three-vessel coronary artery disease with unstable angina. SURGICAL PROCEDURE: Coronary artery bypass grafting x4, urgent coronary artery bypass grafting x4 with the left internal mammary to the left anterior descending coronary artery, reverse saphenous vein graft to the intermediate coronary artery, reverse saphenous vein graft to the obtuse marginal coronary artery, reverse saphenous vein graft to the distal right coronary artery with left greater saphenous thigh and calf endo- vein harvesting, and right thigh greater saphenous endo-vein harvesting. SURGEON: Lanelle Bal, M.D.  History of Present Illness:     Patient continues to slowly improve since seen 4 weeks ago. she's had bilateral thoracentesis done for 100 drain from one side 600 from the other. She continues to increase her physical activity she's noted decreased edema in the lower extremities . She has now gained sufficient strength to be caring for self and doing her own housework.     Past Medical History  Diagnosis Date  . Peptic ulcer   . Mass   . Essential hypertension   . Hyperlipidemia   . Type 2 diabetes mellitus ([place])   . Asthma   . History of blood transfusion   . CKD (chronic kidney disease), stage III   . Coronary artery disease involving native coronary artery of native heart without angina pectoris     a. LHC 3/17 >> 3 v CAD >> CABG  . GERD (gastroesophageal reflux disease)   . Protein calorie malnutrition ([place])   . Candida  infection 10/2015    Bilateral breasts  . History of echocardiogram     a. Echo 3/17: Mild focal basal septal hypertrophy, vigorous LVF, EF 65-70%, normal wall motion, grade 1 diastolic dysfunction, MAC  . Chronic diastolic CHF (congestive heart failure) ([place])   . Bilateral pleural effusion     a. s/p CABG >> s/p bilat thoracentesis 11/2015     History  Smoking status  . Never Smoker   Smokeless tobacco  . Former Systems developer  . Types: Snuff    History  Alcohol Use No     No Known Allergies  Current Outpatient Prescriptions  Medication Sig Dispense Refill  . albuterol (PROVENTIL HFA;VENTOLIN HFA) 108 (90 Base) MCG/ACT inhaler Inhale 2 puffs into the lungs every 6 (six) hours as needed for wheezing or shortness of breath.    [place] Kitchen amLODipine (NORVASC) 10 MG tablet Take 10 mg by mouth daily.    [place] Kitchen aspirin EC 81 MG EC tablet Take 1 tablet (81 mg total) by mouth daily.    [place] Kitchen atorvastatin (LIPITOR) 80 MG tablet Take 80 mg by mouth daily.    . Calcium Carb-Cholecalciferol [phone] MG-UNIT CAPS Take 1 tablet by mouth daily.    . calcium carbonate (TUMS - DOSED IN MG ELEMENTAL CALCIUM) 500 MG chewable tablet Chew 1 tablet by mouth daily as needed for indigestion or heartburn.    . Cholecalciferol (VITAMIN D-3) 1000 units CAPS Take 1 capsule by mouth daily.    . diphenhydrAMINE (BENADRYL) 25 MG tablet Take 25 mg by mouth  as needed for itching or allergies.     . ferrous sulfate 325 (65 FE) MG tablet Take 325 mg by mouth daily with breakfast.    . furosemide (LASIX) 20 MG tablet Take 2 tabs (40 mg total) by mouth at 8 am, and then take 1 tab (20 mg total) by mouth at 2 pm daily 270 tablet 3  . insulin glargine (LANTUS) 100 UNIT/ML injection INJECT 15 UNITS INTO THE SKIN IN THE MORNING THAN 10 UNITS INTO THE SKIN AT NIGHT BEFORE BEDTIME    . metoprolol tartrate (LOPRESSOR) 25 MG tablet Take 1 tablet (25 mg total) by mouth 2 (two) times daily. 180 tablet 3  . nystatin (MYCOSTATIN) powder Apply topically 2  (two) times daily. 100,000 units/gm powder applied under bilateral breasts BID x3 weeks     No current facility-administered medications for this visit.       Physical Exam: BP 152/76 mmHg  Pulse 77  Resp 16  Ht 5\' 2"  (1.575 m)  Wt 170 lb (77.111 kg)  BMI 31.09 kg/m2  SpO2 96% On 2 L of oxygen General appearance: alert, cooperative, appears stated age and no distress Neurologic: intact Heart: regular rate and rhythm, S1, S2 normal, no murmur, click, rub or gallop Lungs: diminished breath sounds bibasilar Abdomen: soft, non-tender; bowel sounds normal; no masses,  no organomegaly Extremities: extremities normal, atraumatic, no cyanosis or Homans sign is negative, no sign of DVT, patient has continued pedal edema bilaterally though this is improved from her visit 2 weeks ago ondiuretic therapy Wound: Sternum is stable and well healed   Diagnostic Studies & Laboratory data:     Recent Radiology Findings:   Dg Chest 2 View  01/24/2016  CLINICAL DATA:  Status post CABG in March of 2027. Known persistent left pleural effusion, doing well clinically EXAM: CHEST  2 VIEW COMPARISON:  PA and lateral chest x-ray of December 13, 2015 FINDINGS: The lungs are well-expanded. The pleural effusion on the left has decreased in size. Only a small amount of pleural fluid now blunts the left lateral and posterior costophrenic angles. There is no alveolar infiltrate. The cardiac silhouette is mildly enlarged. The central pulmonary vascularity is less prominent. There is calcification in the wall of the aortic arch. The sternal wires are intact. There are degenerative disc changes at multiple thoracic levels. IMPRESSION: Near total resolution of the left pleural effusion. Only a small volume remains. Stable cardiomegaly. Decreased central pulmonary vascular congestion. Aortic atherosclerosis. Electronically Signed   By: David  Martinique M.D.   On: 01/24/2016 12:46      Recent Lab Findings: Lab Results    Component Value Date   WBC 9.1 10/09/2015   HGB 9.8* 10/09/2015   HCT 32* 10/09/2015   PLT 385 10/09/2015   GLUCOSE 253* 11/23/2015   CHOL 139 09/15/2015   TRIG 119 09/15/2015   HDL 35* 09/15/2015   LDLCALC 80 09/15/2015   ALT 25 11/23/2015   AST 24 11/23/2015   NA 138 11/23/2015   K 4.3 11/23/2015   CL 96* 11/23/2015   CREATININE 1.67* 11/23/2015   BUN 37* 11/23/2015   CO2 31 11/23/2015   TSH 2.184 09/25/2015   INR 1.53* 09/19/2015   HGBA1C 7.6 10/09/2015      Assessment / Plan:      Patient now status post bilateral thoracentesis with improved improvement in her respiratory status and her functional status proximal  Overall she continues to make slow but steady progress following high risk coronary artery  bypass grafting for critical left main disease. She is followed  with nephrology in [facility] , She notes her nephrologists tells her that her renal function is stable I've not made a return appointment to see me but would be glad to see her at her or Dr. Francesca Oman request.   [name] MD      [address] [place] [address] Office [phone]   Beeper [phone]  01/24/2016 1:04 PM

## 2016-01-25 DIAGNOSIS — I119 Hypertensive heart disease without heart failure: Secondary | ICD-10-CM | POA: Diagnosis not present

## 2016-01-25 DIAGNOSIS — E559 Vitamin D deficiency, unspecified: Secondary | ICD-10-CM | POA: Diagnosis not present

## 2016-01-25 DIAGNOSIS — E785 Hyperlipidemia, unspecified: Secondary | ICD-10-CM | POA: Diagnosis not present

## 2016-01-25 DIAGNOSIS — N183 Chronic kidney disease, stage 3 (moderate): Secondary | ICD-10-CM | POA: Diagnosis not present

## 2016-01-25 DIAGNOSIS — J45909 Unspecified asthma, uncomplicated: Secondary | ICD-10-CM | POA: Diagnosis not present

## 2016-01-25 DIAGNOSIS — R1013 Epigastric pain: Secondary | ICD-10-CM | POA: Diagnosis not present

## 2016-01-25 DIAGNOSIS — E1151 Type 2 diabetes mellitus with diabetic peripheral angiopathy without gangrene: Secondary | ICD-10-CM | POA: Diagnosis not present

## 2016-01-25 DIAGNOSIS — K254 Chronic or unspecified gastric ulcer with hemorrhage: Secondary | ICD-10-CM | POA: Diagnosis not present

## 2016-01-30 DIAGNOSIS — Z1231 Encounter for screening mammogram for malignant neoplasm of breast: Secondary | ICD-10-CM | POA: Diagnosis not present

## 2016-02-18 DIAGNOSIS — K254 Chronic or unspecified gastric ulcer with hemorrhage: Secondary | ICD-10-CM | POA: Diagnosis not present

## 2016-02-18 DIAGNOSIS — N183 Chronic kidney disease, stage 3 (moderate): Secondary | ICD-10-CM | POA: Diagnosis not present

## 2016-02-18 DIAGNOSIS — J45909 Unspecified asthma, uncomplicated: Secondary | ICD-10-CM | POA: Diagnosis not present

## 2016-02-18 DIAGNOSIS — I119 Hypertensive heart disease without heart failure: Secondary | ICD-10-CM | POA: Diagnosis not present

## 2016-02-18 DIAGNOSIS — E785 Hyperlipidemia, unspecified: Secondary | ICD-10-CM | POA: Diagnosis not present

## 2016-02-18 DIAGNOSIS — E559 Vitamin D deficiency, unspecified: Secondary | ICD-10-CM | POA: Diagnosis not present

## 2016-02-18 DIAGNOSIS — E1151 Type 2 diabetes mellitus with diabetic peripheral angiopathy without gangrene: Secondary | ICD-10-CM | POA: Diagnosis not present

## 2016-02-18 DIAGNOSIS — R1013 Epigastric pain: Secondary | ICD-10-CM | POA: Diagnosis not present

## 2016-02-22 DIAGNOSIS — Z951 Presence of aortocoronary bypass graft: Secondary | ICD-10-CM | POA: Diagnosis not present

## 2016-02-22 DIAGNOSIS — R0602 Shortness of breath: Secondary | ICD-10-CM | POA: Diagnosis not present

## 2016-02-22 DIAGNOSIS — Z48812 Encounter for surgical aftercare following surgery on the circulatory system: Secondary | ICD-10-CM | POA: Diagnosis not present

## 2016-02-22 DIAGNOSIS — I5033 Acute on chronic diastolic (congestive) heart failure: Secondary | ICD-10-CM | POA: Diagnosis not present

## 2016-02-22 DIAGNOSIS — E1129 Type 2 diabetes mellitus with other diabetic kidney complication: Secondary | ICD-10-CM | POA: Diagnosis not present

## 2016-03-05 DIAGNOSIS — E119 Type 2 diabetes mellitus without complications: Secondary | ICD-10-CM | POA: Diagnosis not present

## 2016-03-05 DIAGNOSIS — E785 Hyperlipidemia, unspecified: Secondary | ICD-10-CM | POA: Diagnosis not present

## 2016-03-05 DIAGNOSIS — Z951 Presence of aortocoronary bypass graft: Secondary | ICD-10-CM | POA: Diagnosis not present

## 2016-03-05 DIAGNOSIS — Z794 Long term (current) use of insulin: Secondary | ICD-10-CM | POA: Diagnosis not present

## 2016-03-05 DIAGNOSIS — I10 Essential (primary) hypertension: Secondary | ICD-10-CM | POA: Diagnosis not present

## 2016-03-05 DIAGNOSIS — N184 Chronic kidney disease, stage 4 (severe): Secondary | ICD-10-CM | POA: Diagnosis not present

## 2016-03-05 DIAGNOSIS — E1169 Type 2 diabetes mellitus with other specified complication: Secondary | ICD-10-CM | POA: Diagnosis not present

## 2016-03-05 DIAGNOSIS — I2510 Atherosclerotic heart disease of native coronary artery without angina pectoris: Secondary | ICD-10-CM | POA: Diagnosis not present

## 2016-03-19 ENCOUNTER — Encounter: Payer: Self-pay | Admitting: Cardiology

## 2016-03-24 DIAGNOSIS — E1129 Type 2 diabetes mellitus with other diabetic kidney complication: Secondary | ICD-10-CM | POA: Diagnosis not present

## 2016-03-24 DIAGNOSIS — Z951 Presence of aortocoronary bypass graft: Secondary | ICD-10-CM | POA: Diagnosis not present

## 2016-03-24 DIAGNOSIS — R0602 Shortness of breath: Secondary | ICD-10-CM | POA: Diagnosis not present

## 2016-03-24 DIAGNOSIS — I5033 Acute on chronic diastolic (congestive) heart failure: Secondary | ICD-10-CM | POA: Diagnosis not present

## 2016-03-24 DIAGNOSIS — Z48812 Encounter for surgical aftercare following surgery on the circulatory system: Secondary | ICD-10-CM | POA: Diagnosis not present

## 2016-03-27 DIAGNOSIS — N189 Chronic kidney disease, unspecified: Secondary | ICD-10-CM | POA: Diagnosis not present

## 2016-03-27 DIAGNOSIS — Z23 Encounter for immunization: Secondary | ICD-10-CM | POA: Diagnosis not present

## 2016-03-27 DIAGNOSIS — E119 Type 2 diabetes mellitus without complications: Secondary | ICD-10-CM | POA: Diagnosis not present

## 2016-03-27 DIAGNOSIS — R809 Proteinuria, unspecified: Secondary | ICD-10-CM | POA: Diagnosis not present

## 2016-03-27 DIAGNOSIS — E7800 Pure hypercholesterolemia, unspecified: Secondary | ICD-10-CM | POA: Diagnosis not present

## 2016-03-27 DIAGNOSIS — N184 Chronic kidney disease, stage 4 (severe): Secondary | ICD-10-CM | POA: Diagnosis not present

## 2016-03-27 DIAGNOSIS — M109 Gout, unspecified: Secondary | ICD-10-CM | POA: Diagnosis not present

## 2016-03-27 DIAGNOSIS — I10 Essential (primary) hypertension: Secondary | ICD-10-CM | POA: Diagnosis not present

## 2016-03-27 DIAGNOSIS — D649 Anemia, unspecified: Secondary | ICD-10-CM | POA: Diagnosis not present

## 2016-04-03 ENCOUNTER — Ambulatory Visit (INDEPENDENT_AMBULATORY_CARE_PROVIDER_SITE_OTHER): Payer: Commercial Managed Care - HMO | Admitting: Cardiology

## 2016-04-03 ENCOUNTER — Other Ambulatory Visit: Payer: Self-pay | Admitting: Cardiology

## 2016-04-03 ENCOUNTER — Encounter: Payer: Self-pay | Admitting: Cardiology

## 2016-04-03 VITALS — BP 126/70 | HR 71 | Ht 62.0 in | Wt 165.0 lb

## 2016-04-03 DIAGNOSIS — I509 Heart failure, unspecified: Secondary | ICD-10-CM | POA: Diagnosis not present

## 2016-04-03 DIAGNOSIS — I2510 Atherosclerotic heart disease of native coronary artery without angina pectoris: Secondary | ICD-10-CM

## 2016-04-03 DIAGNOSIS — N183 Chronic kidney disease, stage 3 unspecified: Secondary | ICD-10-CM

## 2016-04-03 DIAGNOSIS — I5033 Acute on chronic diastolic (congestive) heart failure: Secondary | ICD-10-CM

## 2016-04-03 DIAGNOSIS — I10 Essential (primary) hypertension: Secondary | ICD-10-CM

## 2016-04-03 DIAGNOSIS — E785 Hyperlipidemia, unspecified: Secondary | ICD-10-CM

## 2016-04-03 DIAGNOSIS — Z951 Presence of aortocoronary bypass graft: Secondary | ICD-10-CM

## 2016-04-03 MED ORDER — NITROGLYCERIN 0.4 MG SL SUBL: 0.4000 mg | SUBLINGUAL_TABLET | SUBLINGUAL | 3 refills | Status: DC | PRN

## 2016-04-03 NOTE — Patient Instructions (Signed)
Medication Instructions:   DR [name] HAS PRESCRIBED YOU TO UTILIZE SUBLINGUAL (UNDER THE TONGUE) NITROGLYCERIN 0.4 MG PLACE 1 TABLET UNDER THE TONGUE ONLY AS NEEDED FOR CHEST PAIN--YOU MAY TAKE UP TO 3 TABLETS WITH 5 MINUTE INTERVALS BETWEEN EACH DOSE--PLEASE FOLLOW THE DIRECTIONS ON THE BOTTLE CAREFULLY   Labwork:  PRIOR TO YOUR 3 MONTH FOLLOW-UP APPOINTMENT WITH DR NELSON TO CHECK---CMET, CBC W DIFF, TSH, AND LIPIDS---PLEASE COME FASTING TO THIS LAB APPOINTMENT    Follow-Up:  3 MONTHS WITH DR NELSON---PLEASE HAVE YOUR LABS DONE PRIOR TO THIS APPOINTMENT       If you need a refill on your cardiac medications before your next appointment, please call your pharmacy.

## 2016-04-03 NOTE — Progress Notes (Signed)
Cardiology Office Note:    Date:  04/03/2016   ID:  Monica [name], DOB [birth year], MRN [id number]  PCP:  Benito Mccreedy, MD  Cardiologist:  Dr. Ena Dawley   Electrophysiologist:  N/a Nephrologist: Dr. [facility])  Referring MD: Benito Mccreedy, MD   No chief complaint on file.   History of Present Illness:     Monica [name] is a 82 y.o. female with a hx of CAD s/p CABG in 3/17, HTN, HL, diabetes, asthma, GERD, gout, CKD, PUD. Admitted in 3/17 with unstable angina. LHC demonstrated severe 3 vessel CAD and she underwent CABG with LIMA-LAD, SVG-intermediate, SVG-OM, SVG-distal RCA. Postoperative course was complicated by blood loss anemia requiring transfusion with PRBCs.  Since her surgery, she has undergone bilateral thoracenteses for persistent pleural effusions.  Last seen by Dr. Servando Snare 12/13/15.    04/03/2016 this is a 3 months follow-up, she overall feels well, but in the last 2 days she developed 2 episodes of retrosternal chest pain that lasted 15-20 minutes. One of them was while she was cooking and resolved on its own. She says that the pain was mild and was not rating anywhere and was not associated with any other symptoms. She has been compliant with her medicines with no side effects. No orthopnea or personal nocturnal dyspnea she can easily walk a block and denies any lower extremity edema no palpitations or syncope.   Past Medical History:  Diagnosis Date  . Asthma   . Bilateral pleural effusion    a. s/p CABG >> s/p bilat thoracentesis 11/2015  . Candida infection 10/2015   Bilateral breasts  . Chronic diastolic CHF (congestive heart failure) ([place])   . CKD (chronic kidney disease), stage III   . Coronary artery disease involving native coronary artery of native heart without angina pectoris    a. LHC 3/17 >> 3 v CAD >> CABG  . Essential hypertension   . GERD (gastroesophageal reflux disease)   . History of blood transfusion   . History of echocardiogram    a. Echo 3/17: Mild focal basal septal hypertrophy, vigorous LVF, EF 65-70%, normal wall motion, grade 1 diastolic dysfunction, MAC  . Hyperlipidemia   . Mass   . Peptic ulcer   . Protein calorie malnutrition ([place])   . Type 2 diabetes mellitus ([place])     Past Surgical History:  Procedure Laterality Date  . ABDOMINAL HYSTERECTOMY    . CARDIAC CATHETERIZATION N/A 09/17/2015   Procedure: Left Heart Cath and Coronary Angiography;  Surgeon: Jettie Booze, MD;  Location: [facility];  Service: Cardiovascular;  Laterality: N/A;  . COLON SURGERY    . CORONARY ARTERY BYPASS GRAFT N/A 09/19/2015   Procedure: CORONARY ARTERY BYPASS GRAFTING (CABG) TIMES FOUR USING BILATARAL SAPHENOUS VEIN GRAFTS AND LEFT INTERNAL MAMMARY ARTERY;  Surgeon: [name], MD;  Location: [facility];  Service: Open Heart Surgery;  Laterality: N/A;  . TEE WITHOUT CARDIOVERSION N/A 09/19/2015   Procedure: TRANSESOPHAGEAL ECHOCARDIOGRAM (TEE);  Surgeon: [name], MD;  Location: [facility];  Service: Open Heart Surgery;  Laterality: N/A;    Current Medications: Outpatient Medications Prior to Visit  Medication Sig Dispense Refill  . albuterol (PROVENTIL HFA;VENTOLIN HFA) 108 (90 Base) MCG/ACT inhaler Inhale 2 puffs into the lungs every 6 (six) hours as needed for wheezing or shortness of breath.    [place] Kitchen amLODipine (NORVASC) 10 MG tablet Take 10 mg by mouth daily.    [place] Kitchen aspirin EC 81 MG EC tablet Take 1  tablet (81 mg total) by mouth daily.    [place] Kitchen atorvastatin (LIPITOR) 80 MG tablet Take 80 mg by mouth daily.    . Calcium Carb-Cholecalciferol [phone] MG-UNIT CAPS Take 1 tablet by mouth daily.    . calcium carbonate (TUMS - DOSED IN MG ELEMENTAL CALCIUM) 500 MG chewable tablet Chew 1 tablet by mouth daily as needed for indigestion or heartburn.    . Cholecalciferol (VITAMIN D-3) 1000 units CAPS Take 1 capsule by mouth daily.    . diphenhydrAMINE (BENADRYL) 25 MG tablet Take 25 mg by mouth as needed for itching or  allergies.     . ferrous sulfate 325 (65 FE) MG tablet Take 325 mg by mouth daily with breakfast.    . furosemide (LASIX) 20 MG tablet Take 2 tabs (40 mg total) by mouth at 8 am, and then take 1 tab (20 mg total) by mouth at 2 pm daily 270 tablet 3  . insulin glargine (LANTUS) 100 UNIT/ML injection INJECT 15 UNITS INTO THE SKIN IN THE MORNING, 10 UNITS INTO THE SKIN AT NIGHT BEFORE BEDTIME    . metoprolol tartrate (LOPRESSOR) 25 MG tablet Take 1 tablet (25 mg total) by mouth 2 (two) times daily. 180 tablet 3  . nystatin (MYCOSTATIN) powder Apply topically 2 (two) times daily. 100,000 units/gm powder applied under bilateral breasts BID x3 weeks     No facility-administered medications prior to visit.       Allergies:   Review of patient's allergies indicates no known allergies.   Social History   Social History  . Marital status: Widowed    Spouse name: N/A  . Number of children: N/A  . Years of education: N/A   Occupational History  . Retired    Social History Main Topics  . Smoking status: Never Smoker  . Smokeless tobacco: Former Systems developer    Types: Snuff  . Alcohol use No  . Drug use: No  . Sexual activity: Not Asked   Other Topics Concern  . None   Social History Narrative  . None     Family History:  The patient's family history includes Heart attack in her father.   ROS:   Please see the history of present illness.    Review of Systems  Cardiovascular: Positive for leg swelling.  Neurological: Positive for loss of balance.   All other systems reviewed and are negative.   Physical Exam:    VS:  BP 126/70   Pulse 71   Ht 5\' 2"  (1.575 m)   Wt 165 lb (74.8 kg)   SpO2 98%   BMI 30.18 kg/m    Physical Exam  Constitutional: She is oriented to person, place, and time. She appears well-developed and well-nourished.  HENT:  Head: Normocephalic and atraumatic.  Neck: Neck supple. No JVD present.  Cardiovascular: Normal rate, regular rhythm and normal heart sounds.    No murmur heard. Pulmonary/Chest: Effort normal and breath sounds normal. She has no wheezes. She has no rales.  Abdominal: Soft. There is no tenderness.  Musculoskeletal:  1+ bilateral LE edema - compression stockings in place  Neurological: She is alert and oriented to person, place, and time.  Skin: Skin is warm and dry.  Psychiatric: She has a normal mood and affect.    Wt Readings from Last 3 Encounters:  04/03/16 165 lb (74.8 kg)  01/24/16 170 lb (77.1 kg)  12/31/15 170 lb (77.1 kg)      Studies/Labs Reviewed:     EKG:  EKG is  ordered today.  The ekg ordered today demonstrates NSR, HR 74, normal axis, PRWP, QTc 472 ms, no sig changes.   Recent Labs: 09/20/2015: Magnesium 2.1 09/25/2015: TSH 2.184 10/09/2015: Hemoglobin 9.8; Platelets 385 11/23/2015: ALT 25; Brain Natriuretic Peptide 162.6; BUN 37; Creat 1.67; Potassium 4.3; Sodium 138   Recent Lipid Panel    Component Value Date/Time   CHOL 139 09/15/2015 0343   TRIG 119 09/15/2015 0343   HDL 35 (L) 09/15/2015 0343   CHOLHDL 4.0 09/15/2015 0343   VLDL 24 09/15/2015 0343   LDLCALC 80 09/15/2015 0343    Additional studies/ records that were reviewed today include:   Carotid US 09/18/15 Bilateral ICA 1-39%  LHC 09/17/15 LAD proximal 90%, D2 80% LCx ostial 99% RCA proximal 60%, distal 75%, RPDA 25% Severe three vessel disease. Plan for CVTS consult for CABG. She needs BP control, beta blocker and statin.  Echo 09/17/15 Mild focal basal septal hypertrophy, vigorous LVF, EF 65-70%, normal wall motion, grade 1 diastolic dysfunction, MAC   ASSESSMENT:     1. Coronary artery disease involving native coronary artery of native heart without angina pectoris   2. CKD (chronic kidney disease), stage III   3. Acute on chronic congestive heart failure, unspecified congestive heart failure type ([place])   4. Hyperlipidemia   5. Acute on chronic diastolic CHF (congestive heart failure) ([place])   6. Essential hypertension   7.  S/P CABG x 3     PLAN:     In order of problems listed above:  1. Acute on Chronic Diastolic CHF -appears euvolemic.  She has had persistent effusions and is s/p bilateral thoracenteses.  She sees. Dr. Servando Snare back next month with repeat CXR.  Continue current regimen of Lasix 40 mg in A and 20 mg in P. Her nephrologist recently checked her BMET and the patient notes no changes were made.   Her baseline now is 165 pounds.  2. CAD - s/p CABG. Continue ASA, statin, beta-blocker. 2 separate episodes of chest pain 3 months, we will start sublingual nitroglycerin when necessary.  3. HTN - controlled on current regimen.   4. HL - Managed by PCP. LDL in 3/17 was 80.  Continue statin.  5. CKD - Managed by Nephrology in [place].  Follow-up in 3 months with lipids, CMP, TSH, CBC prior to next visit.  Medication Adjustments/Labs and Tests Ordered: Current medicines are reviewed at length with the patient today.  Concerns regarding medicines are outlined above.  Medication changes, Labs and Tests ordered today are outlined in the Patient Instructions noted below. There are no Patient Instructions on file for this visit. Signed, Ena Dawley, MD  04/03/2016 9:18 AM    [facility] [address], [place], [place]  [address] Phone: [phone]; Fax: [phone]

## 2016-04-23 DIAGNOSIS — I5033 Acute on chronic diastolic (congestive) heart failure: Secondary | ICD-10-CM | POA: Diagnosis not present

## 2016-04-23 DIAGNOSIS — Z48812 Encounter for surgical aftercare following surgery on the circulatory system: Secondary | ICD-10-CM | POA: Diagnosis not present

## 2016-04-23 DIAGNOSIS — R0602 Shortness of breath: Secondary | ICD-10-CM | POA: Diagnosis not present

## 2016-04-23 DIAGNOSIS — E1129 Type 2 diabetes mellitus with other diabetic kidney complication: Secondary | ICD-10-CM | POA: Diagnosis not present

## 2016-04-23 DIAGNOSIS — Z951 Presence of aortocoronary bypass graft: Secondary | ICD-10-CM | POA: Diagnosis not present

## 2016-05-23 DIAGNOSIS — J45909 Unspecified asthma, uncomplicated: Secondary | ICD-10-CM | POA: Diagnosis not present

## 2016-05-23 DIAGNOSIS — E1151 Type 2 diabetes mellitus with diabetic peripheral angiopathy without gangrene: Secondary | ICD-10-CM | POA: Diagnosis not present

## 2016-05-23 DIAGNOSIS — R1013 Epigastric pain: Secondary | ICD-10-CM | POA: Diagnosis not present

## 2016-05-23 DIAGNOSIS — E559 Vitamin D deficiency, unspecified: Secondary | ICD-10-CM | POA: Diagnosis not present

## 2016-05-23 DIAGNOSIS — N183 Chronic kidney disease, stage 3 (moderate): Secondary | ICD-10-CM | POA: Diagnosis not present

## 2016-05-23 DIAGNOSIS — E785 Hyperlipidemia, unspecified: Secondary | ICD-10-CM | POA: Diagnosis not present

## 2016-05-23 DIAGNOSIS — K254 Chronic or unspecified gastric ulcer with hemorrhage: Secondary | ICD-10-CM | POA: Diagnosis not present

## 2016-05-23 DIAGNOSIS — I119 Hypertensive heart disease without heart failure: Secondary | ICD-10-CM | POA: Diagnosis not present

## 2016-05-24 DIAGNOSIS — R0602 Shortness of breath: Secondary | ICD-10-CM | POA: Diagnosis not present

## 2016-05-24 DIAGNOSIS — I5033 Acute on chronic diastolic (congestive) heart failure: Secondary | ICD-10-CM | POA: Diagnosis not present

## 2016-05-24 DIAGNOSIS — E1129 Type 2 diabetes mellitus with other diabetic kidney complication: Secondary | ICD-10-CM | POA: Diagnosis not present

## 2016-05-24 DIAGNOSIS — Z48812 Encounter for surgical aftercare following surgery on the circulatory system: Secondary | ICD-10-CM | POA: Diagnosis not present

## 2016-05-24 DIAGNOSIS — Z951 Presence of aortocoronary bypass graft: Secondary | ICD-10-CM | POA: Diagnosis not present

## 2016-06-03 DIAGNOSIS — E1165 Type 2 diabetes mellitus with hyperglycemia: Secondary | ICD-10-CM | POA: Diagnosis not present

## 2016-06-03 DIAGNOSIS — E1169 Type 2 diabetes mellitus with other specified complication: Secondary | ICD-10-CM | POA: Diagnosis not present

## 2016-06-03 DIAGNOSIS — E785 Hyperlipidemia, unspecified: Secondary | ICD-10-CM | POA: Diagnosis not present

## 2016-06-03 DIAGNOSIS — Z794 Long term (current) use of insulin: Secondary | ICD-10-CM | POA: Diagnosis not present

## 2016-06-03 DIAGNOSIS — E559 Vitamin D deficiency, unspecified: Secondary | ICD-10-CM | POA: Diagnosis not present

## 2016-06-05 DIAGNOSIS — E1169 Type 2 diabetes mellitus with other specified complication: Secondary | ICD-10-CM | POA: Diagnosis not present

## 2016-06-05 DIAGNOSIS — I10 Essential (primary) hypertension: Secondary | ICD-10-CM | POA: Diagnosis not present

## 2016-06-05 DIAGNOSIS — E1165 Type 2 diabetes mellitus with hyperglycemia: Secondary | ICD-10-CM | POA: Diagnosis not present

## 2016-06-05 DIAGNOSIS — Z794 Long term (current) use of insulin: Secondary | ICD-10-CM | POA: Diagnosis not present

## 2016-06-05 DIAGNOSIS — R634 Abnormal weight loss: Secondary | ICD-10-CM | POA: Insufficient documentation

## 2016-06-05 DIAGNOSIS — N184 Chronic kidney disease, stage 4 (severe): Secondary | ICD-10-CM | POA: Diagnosis not present

## 2016-06-05 DIAGNOSIS — E559 Vitamin D deficiency, unspecified: Secondary | ICD-10-CM | POA: Diagnosis not present

## 2016-06-05 DIAGNOSIS — E785 Hyperlipidemia, unspecified: Secondary | ICD-10-CM | POA: Diagnosis not present

## 2016-06-05 DIAGNOSIS — I2510 Atherosclerotic heart disease of native coronary artery without angina pectoris: Secondary | ICD-10-CM | POA: Diagnosis not present

## 2016-06-06 DIAGNOSIS — E785 Hyperlipidemia, unspecified: Secondary | ICD-10-CM | POA: Diagnosis not present

## 2016-06-06 DIAGNOSIS — E1165 Type 2 diabetes mellitus with hyperglycemia: Secondary | ICD-10-CM | POA: Diagnosis not present

## 2016-06-06 DIAGNOSIS — Z6828 Body mass index (BMI) 28.0-28.9, adult: Secondary | ICD-10-CM | POA: Diagnosis not present

## 2016-06-06 DIAGNOSIS — I2510 Atherosclerotic heart disease of native coronary artery without angina pectoris: Secondary | ICD-10-CM | POA: Diagnosis not present

## 2016-06-06 DIAGNOSIS — Z794 Long term (current) use of insulin: Secondary | ICD-10-CM | POA: Diagnosis not present

## 2016-06-06 DIAGNOSIS — I10 Essential (primary) hypertension: Secondary | ICD-10-CM | POA: Diagnosis not present

## 2016-06-06 DIAGNOSIS — E1169 Type 2 diabetes mellitus with other specified complication: Secondary | ICD-10-CM | POA: Diagnosis not present

## 2016-06-09 ENCOUNTER — Telehealth: Payer: Self-pay | Admitting: Cardiology

## 2016-06-09 NOTE — Telephone Encounter (Signed)
Pts Daughter is calling to ask if its ok for the pt to have her labs done same day as she will see Dr [name] on 12/21.  Daughter reports that the pt is to have lab on (12/14),  a week prior to seeing Dr [name] on 12/21.  Daughter states that this is really difficult for her to do, for commute reasons, and the pt doesn't ambulate very well.  Daughter states she would greatly appreciate it if the pt could have her lab done on the same day she see's Dr [name] on 12/21.  Daughter states she will bring the pt earlier for lab, prior to her OV.  Daughter is aware that the pt will need to be fasting for this appt, for we will check a cmet, cbc, lipids, and TSH.  Informed the pts daughter this would be completely fine.  I will schedule her lab appt , same day as she see's Dr [name] on 12/21.  Daughter verbalized understanding and agrees with this plan.  Daughter gracious for all the assistance provided.  Will route this message to Dr [name] as a general [place].

## 2016-06-09 NOTE — Telephone Encounter (Signed)
New Message  Pt  daughter call requesting to speak with RN about getting pt lab work completed in [place]. Please call back to discuss

## 2016-06-10 DIAGNOSIS — Z794 Long term (current) use of insulin: Secondary | ICD-10-CM | POA: Diagnosis not present

## 2016-06-10 DIAGNOSIS — E785 Hyperlipidemia, unspecified: Secondary | ICD-10-CM | POA: Diagnosis not present

## 2016-06-10 DIAGNOSIS — I10 Essential (primary) hypertension: Secondary | ICD-10-CM | POA: Diagnosis not present

## 2016-06-10 DIAGNOSIS — N184 Chronic kidney disease, stage 4 (severe): Secondary | ICD-10-CM | POA: Diagnosis not present

## 2016-06-10 DIAGNOSIS — I2510 Atherosclerotic heart disease of native coronary artery without angina pectoris: Secondary | ICD-10-CM | POA: Diagnosis not present

## 2016-06-10 DIAGNOSIS — E1165 Type 2 diabetes mellitus with hyperglycemia: Secondary | ICD-10-CM | POA: Diagnosis not present

## 2016-06-10 DIAGNOSIS — Z6829 Body mass index (BMI) 29.0-29.9, adult: Secondary | ICD-10-CM | POA: Diagnosis not present

## 2016-06-10 DIAGNOSIS — E1169 Type 2 diabetes mellitus with other specified complication: Secondary | ICD-10-CM | POA: Diagnosis not present

## 2016-06-18 ENCOUNTER — Encounter: Payer: Self-pay | Admitting: Cardiology

## 2016-06-19 ENCOUNTER — Other Ambulatory Visit: Payer: Commercial Managed Care - HMO

## 2016-06-20 DIAGNOSIS — I10 Essential (primary) hypertension: Secondary | ICD-10-CM | POA: Diagnosis not present

## 2016-06-20 DIAGNOSIS — E785 Hyperlipidemia, unspecified: Secondary | ICD-10-CM | POA: Diagnosis not present

## 2016-06-20 DIAGNOSIS — E1165 Type 2 diabetes mellitus with hyperglycemia: Secondary | ICD-10-CM | POA: Diagnosis not present

## 2016-06-20 DIAGNOSIS — Z794 Long term (current) use of insulin: Secondary | ICD-10-CM | POA: Diagnosis not present

## 2016-06-20 DIAGNOSIS — Z6829 Body mass index (BMI) 29.0-29.9, adult: Secondary | ICD-10-CM | POA: Diagnosis not present

## 2016-06-20 DIAGNOSIS — E1169 Type 2 diabetes mellitus with other specified complication: Secondary | ICD-10-CM | POA: Diagnosis not present

## 2016-06-23 DIAGNOSIS — Z48812 Encounter for surgical aftercare following surgery on the circulatory system: Secondary | ICD-10-CM | POA: Diagnosis not present

## 2016-06-23 DIAGNOSIS — Z951 Presence of aortocoronary bypass graft: Secondary | ICD-10-CM | POA: Diagnosis not present

## 2016-06-23 DIAGNOSIS — I5033 Acute on chronic diastolic (congestive) heart failure: Secondary | ICD-10-CM | POA: Diagnosis not present

## 2016-06-23 DIAGNOSIS — R0602 Shortness of breath: Secondary | ICD-10-CM | POA: Diagnosis not present

## 2016-06-23 DIAGNOSIS — E1129 Type 2 diabetes mellitus with other diabetic kidney complication: Secondary | ICD-10-CM | POA: Diagnosis not present

## 2016-06-26 ENCOUNTER — Ambulatory Visit (INDEPENDENT_AMBULATORY_CARE_PROVIDER_SITE_OTHER): Payer: Commercial Managed Care - HMO | Admitting: Cardiology

## 2016-06-26 ENCOUNTER — Other Ambulatory Visit: Payer: Commercial Managed Care - HMO | Admitting: *Deleted

## 2016-06-26 VITALS — BP 124/82 | HR 67 | Ht 62.0 in | Wt 163.0 lb

## 2016-06-26 DIAGNOSIS — E785 Hyperlipidemia, unspecified: Secondary | ICD-10-CM | POA: Diagnosis not present

## 2016-06-26 DIAGNOSIS — I5033 Acute on chronic diastolic (congestive) heart failure: Secondary | ICD-10-CM

## 2016-06-26 DIAGNOSIS — Z951 Presence of aortocoronary bypass graft: Secondary | ICD-10-CM

## 2016-06-26 DIAGNOSIS — E782 Mixed hyperlipidemia: Secondary | ICD-10-CM | POA: Diagnosis not present

## 2016-06-26 DIAGNOSIS — N183 Chronic kidney disease, stage 3 unspecified: Secondary | ICD-10-CM

## 2016-06-26 DIAGNOSIS — I2510 Atherosclerotic heart disease of native coronary artery without angina pectoris: Secondary | ICD-10-CM

## 2016-06-26 DIAGNOSIS — I10 Essential (primary) hypertension: Secondary | ICD-10-CM

## 2016-06-26 DIAGNOSIS — I509 Heart failure, unspecified: Secondary | ICD-10-CM

## 2016-06-26 LAB — COMPREHENSIVE METABOLIC PANEL
ALT: 24 U/L (ref 6–29)
AST: 23 U/L (ref 10–35)
Albumin: 3.7 g/dL (ref 3.6–5.1)
Alkaline Phosphatase: 50 U/L (ref 33–130)
BUN: 37 mg/dL — ABNORMAL HIGH (ref 7–25)
CO2: 29 mmol/L (ref 20–31)
Calcium: 8.9 mg/dL (ref 8.6–10.4)
Chloride: 102 mmol/L (ref 98–110)
Creat: 1.71 mg/dL — ABNORMAL HIGH (ref 0.60–0.88)
Glucose, Bld: 169 mg/dL — ABNORMAL HIGH (ref 65–99)
Potassium: 3.5 mmol/L (ref 3.5–5.3)
Sodium: 140 mmol/L (ref 135–146)
Total Bilirubin: 0.4 mg/dL (ref 0.2–1.2)
Total Protein: 6.6 g/dL (ref 6.1–8.1)

## 2016-06-26 LAB — CBC WITH DIFFERENTIAL/PLATELET
Basophils Absolute: 69 cells/uL (ref 0–200)
Basophils Relative: 1 %
Eosinophils Absolute: 345 cells/uL (ref 15–500)
Eosinophils Relative: 5 %
HCT: 33.4 % — ABNORMAL LOW (ref 35.0–45.0)
Hemoglobin: 10.9 g/dL — ABNORMAL LOW (ref 11.7–15.5)
Lymphocytes Relative: 49 %
Lymphs Abs: 3381 cells/uL (ref 850–3900)
MCH: 29.1 pg (ref 27.0–33.0)
MCHC: 32.6 g/dL (ref 32.0–36.0)
MCV: 89.3 fL (ref 80.0–100.0)
MPV: 9.9 fL (ref 7.5–12.5)
Monocytes Absolute: 552 cells/uL (ref 200–950)
Monocytes Relative: 8 %
Neutro Abs: 2553 cells/uL (ref 1500–7800)
Neutrophils Relative %: 37 %
Platelets: 240 10*3/uL (ref 140–400)
RBC: 3.74 MIL/uL — ABNORMAL LOW (ref 3.80–5.10)
RDW: 15.4 % — ABNORMAL HIGH (ref 11.0–15.0)
WBC: 6.9 10*3/uL (ref 3.8–10.8)

## 2016-06-26 LAB — TSH: TSH: 3.85 mIU/L

## 2016-06-26 LAB — LIPID PANEL
Cholesterol: 139 mg/dL (ref <200)
HDL: 49 mg/dL — ABNORMAL LOW (ref >50)
LDL Cholesterol: 70 mg/dL (ref <100)
Total CHOL/HDL Ratio: 2.8 Ratio (ref <5.0)
Triglycerides: 98 mg/dL (ref <150)
VLDL: 20 mg/dL (ref <30)

## 2016-06-26 NOTE — Patient Instructions (Signed)
Medication Instructions:   Your physician recommends that you continue on your current medications as directed. Please refer to the Current Medication list given to you today.     Follow-Up:  3 MONTHS WITH DR NELSON       If you need a refill on your cardiac medications before your next appointment, please call your pharmacy.   

## 2016-06-26 NOTE — Progress Notes (Signed)
Cardiology Office Note:    Date:  06/28/2016   ID:  Monica [name], DOB 1931-03-14, MRN [id number]  PCP:  Monica Mccreedy, MD  Cardiologist:  Dr. Ena [name]   Electrophysiologist:  Monica [name] Nephrologist: Dr. Joesph [name] [facility])  Referring MD: Monica Mccreedy, MD   No chief complaint on file.   History of Present Illness:     Monica [name] is a 82 y.o. female with a hx of CAD s/p CABG in 3/17, HTN, HL, diabetes, asthma, GERD, gout, CKD, PUD. Admitted in 3/17 with unstable angina. LHC demonstrated severe 3 vessel CAD and she underwent CABG with LIMA-LAD, SVG-intermediate, SVG-OM, SVG-distal RCA. Postoperative course was complicated by blood loss anemia requiring transfusion with PRBCs.  Since her surgery, she has undergone bilateral thoracenteses for persistent pleural effusions.  Last seen by Dr. Servando [name] 12/13/15.    06/27/2016 - 3 months follow up, she is feeling well, denies any further chest pain, she has stable dyspnea on moderate exertion. She has been compliant with her medicines with no side effects. No orthopnea or personal nocturnal dyspnea.   Past Medical History:  Diagnosis Date  . Asthma   . Bilateral pleural effusion    a. s/p CABG >> s/p bilat thoracentesis 11/2015  . Candida infection 10/2015   Bilateral breasts  . Chronic diastolic CHF (congestive heart failure) ([place])   . CKD (chronic kidney disease), stage III   . Coronary artery disease involving native coronary artery of native heart without angina pectoris    a. LHC 3/17 >> 3 v CAD >> CABG  . Essential hypertension   . GERD (gastroesophageal reflux disease)   . History of blood transfusion   . History of echocardiogram    a. Echo 3/17: Mild focal basal septal hypertrophy, vigorous LVF, EF 65-70%, normal wall motion, grade 1 diastolic dysfunction, MAC  . Hyperlipidemia   . Mass   . Peptic ulcer   . Protein calorie malnutrition ([place])   . Type 2 diabetes mellitus ([place])     Past Surgical History:  Procedure  Laterality Date  . ABDOMINAL HYSTERECTOMY    . CARDIAC CATHETERIZATION Monica [name] 09/17/2015   Procedure: Left Heart Cath and Coronary Angiography;  Surgeon: Jettie Booze, MD;  Location: [facility];  Service: Cardiovascular;  Laterality: Monica [name];  . COLON SURGERY    . CORONARY ARTERY BYPASS GRAFT Monica [name] 09/19/2015   Procedure: CORONARY ARTERY BYPASS GRAFTING (CABG) TIMES FOUR USING BILATARAL SAPHENOUS VEIN GRAFTS AND LEFT INTERNAL MAMMARY ARTERY;  Surgeon: [name], MD;  Location: [facility];  Service: Open Heart Surgery;  Laterality: Monica [name];  . TEE WITHOUT CARDIOVERSION Monica [name] 09/19/2015   Procedure: TRANSESOPHAGEAL ECHOCARDIOGRAM (TEE);  Surgeon: [name], MD;  Location: [place];  Service: Open Heart Surgery;  Laterality: Monica [name];    Current Medications: Outpatient Medications Prior to Visit  Medication Sig Dispense Refill  . albuterol (PROVENTIL HFA;VENTOLIN HFA) 108 (90 Base) MCG/ACT inhaler Inhale 2 puffs into the lungs every 6 (six) hours as needed for wheezing or shortness of breath.    Marland Kitchen amLODipine (NORVASC) 10 MG tablet Take 10 mg by mouth daily.    Marland Kitchen aspirin EC 81 MG EC tablet Take 1 tablet (81 mg total) by mouth daily.    Marland Kitchen atorvastatin (LIPITOR) 80 MG tablet Take 80 mg by mouth daily.    . Calcium Carb-Cholecalciferol [phone] MG-UNIT CAPS Take 1 tablet by mouth daily.    . calcium carbonate (TUMS - DOSED IN MG ELEMENTAL CALCIUM) 500 MG chewable tablet Chew  1 tablet by mouth daily as needed for indigestion or heartburn.    . Cholecalciferol (VITAMIN D-3) 1000 units CAPS Take 1 capsule by mouth daily.    . diphenhydrAMINE (BENADRYL) 25 MG tablet Take 25 mg by mouth as needed for itching or allergies.     . ferrous sulfate 325 (65 FE) MG tablet Take 325 mg by mouth daily with breakfast.    . furosemide (LASIX) 20 MG tablet Take 2 tabs (40 mg total) by mouth at 8 am, and then take 1 tab (20 mg total) by mouth at 2 pm daily (Patient taking differently: Take 2 tabs (40 mg total) by mouth  at 8 am.) 270 tablet 3  . insulin glargine (LANTUS) 100 UNIT/ML injection INJECT 15 UNITS INTO THE SKIN IN THE MORNING, 10 UNITS INTO THE SKIN AT NIGHT BEFORE BEDTIME    . metoprolol tartrate (LOPRESSOR) 25 MG tablet Take 1 tablet (25 mg total) by mouth 2 (two) times daily. 180 tablet 3  . nitroGLYCERIN (NITROSTAT) 0.4 MG SL tablet PLACE 1 TABLET BY MOUTH UNDER THE TONGUE EVERY 5 MINUTES AS NEEDED FOR CHEST PAIN. FOR UP TO 3 DOSES. 100 tablet 3  . nystatin (MYCOSTATIN) powder Apply topically 2 (two) times daily. 100,000 units/gm powder applied under bilateral breasts BID x3 weeks     No facility-administered medications prior to visit.       Allergies:   Patient has no known allergies.   Social History   Social History  . Marital status: Widowed    Spouse name: Monica [name]  . Number of children: Monica [name]  . Years of education: Monica [name]   Occupational History  . Retired    Social History Main Topics  . Smoking status: Never Smoker  . Smokeless tobacco: Former Systems developer    Types: Snuff  . Alcohol use No  . Drug use: No  . Sexual activity: Not on file   Other Topics Concern  . Not on file   Social History Narrative  . No narrative on file     Family History:  The patient's family history includes Heart attack in her father.   ROS:   Please see the history of present illness.    Review of Systems  Cardiovascular: Positive for leg swelling.  Neurological: Positive for loss of balance.   All other systems reviewed and are negative.   Physical Exam:    VS:  BP 124/82   Pulse 67   Ht 5\' 2"  (1.575 m)   Wt 163 lb (73.9 kg)   SpO2 98%   BMI 29.81 kg/m    Physical Exam  Constitutional: She is oriented to person, place, and time. She appears well-developed and well-nourished.  HENT:  Head: Normocephalic and atraumatic.  Neck: Neck supple. No JVD present.  Cardiovascular: Normal rate, regular rhythm and normal heart sounds.   No murmur heard. Pulmonary/Chest: Effort normal and breath sounds  normal. She has no wheezes. She has no rales.  Abdominal: Soft. There is no tenderness.  Musculoskeletal:  1+ bilateral LE edema - compression stockings in place  Neurological: She is alert and oriented to person, place, and time.  Skin: Skin is warm and dry.  Psychiatric: She has a normal mood and affect.    Wt Readings from Last 3 Encounters:  06/26/16 163 lb (73.9 kg)  04/03/16 165 lb (74.8 kg)  01/24/16 170 lb (77.1 kg)      Studies/Labs Reviewed:     EKG:  EKG is  ordered today.  The ekg ordered today demonstrates NSR, HR 74, normal axis, PRWP, QTc 472 ms, no sig changes.   Recent Labs: 09/20/2015: Magnesium 2.1 11/23/2015: Brain Natriuretic Peptide 162.6 06/26/2016: ALT 24; BUN 37; Creat 1.71; Hemoglobin 10.9; Platelets 240; Potassium 3.5; Sodium 140; TSH 3.85   Recent Lipid Panel    Component Value Date/Time   CHOL 139 06/26/2016 1004   TRIG 98 06/26/2016 1004   HDL 49 (L) 06/26/2016 1004   CHOLHDL 2.8 06/26/2016 1004   VLDL 20 06/26/2016 1004   LDLCALC 70 06/26/2016 1004    Additional studies/ records that were reviewed today include:   Carotid US 09/18/15 Bilateral ICA 1-39%  LHC 09/17/15 LAD proximal 90%, D2 80% LCx ostial 99% RCA proximal 60%, distal 75%, RPDA 25% Severe three vessel disease. Plan for CVTS consult for CABG. She needs BP control, beta blocker and statin.  Echo 09/17/15 Mild focal basal septal hypertrophy, vigorous LVF, EF 65-70%, normal wall motion, grade 1 diastolic dysfunction, MAC   ASSESSMENT:     1. Coronary artery disease involving native coronary artery of native heart without angina pectoris   2. S/P CABG x 3   3. Mixed hyperlipidemia   4. Acute on chronic diastolic CHF (congestive heart failure) ([place])   5. Essential hypertension     PLAN:     In order of problems listed above:  1. Acute on Chronic Diastolic CHF -appears euvolemic.  Continue Lasix 40 mg in the am and 20 mg in pm.   2. CAD - s/p CABG. Continue ASA,  statin, beta-blocker. Refill sl NTG.  3. HTN - controlled on current regimen.   4. HL - LDL 70, TG 98.  Continue atorvastatin.  5. CKD - Managed by Nephrology in [place].  Follow-up in 3 months with lipids, CMP, TSH, CBC prior to next visit.  Medication Adjustments/Labs and Tests Ordered: Current medicines are reviewed at length with the patient today.  Concerns regarding medicines are outlined above.  Medication changes, Labs and Tests ordered today are outlined in the Patient Instructions noted below. Patient Instructions  Medication Instructions:   Your physician recommends that you continue on your current medications as directed. Please refer to the Current Medication list given to you today.    Follow-Up:  3 MONTHS WITH DR [name]       If you need a refill on your cardiac medications before your next appointment, please call your pharmacy.    Signed, Monica Dawley, MD  06/28/2016 4:40 AM    [address], [place], [place]  [address] Phone: [phone]; Fax: [phone]

## 2016-07-04 DIAGNOSIS — E1165 Type 2 diabetes mellitus with hyperglycemia: Secondary | ICD-10-CM | POA: Diagnosis not present

## 2016-07-04 DIAGNOSIS — Z794 Long term (current) use of insulin: Secondary | ICD-10-CM | POA: Diagnosis not present

## 2016-07-04 DIAGNOSIS — E785 Hyperlipidemia, unspecified: Secondary | ICD-10-CM | POA: Diagnosis not present

## 2016-07-04 DIAGNOSIS — I10 Essential (primary) hypertension: Secondary | ICD-10-CM | POA: Diagnosis not present

## 2016-07-04 DIAGNOSIS — E1169 Type 2 diabetes mellitus with other specified complication: Secondary | ICD-10-CM | POA: Diagnosis not present

## 2016-07-04 DIAGNOSIS — Z6830 Body mass index (BMI) 30.0-30.9, adult: Secondary | ICD-10-CM | POA: Diagnosis not present

## 2016-07-16 DIAGNOSIS — E1165 Type 2 diabetes mellitus with hyperglycemia: Secondary | ICD-10-CM | POA: Diagnosis not present

## 2016-07-16 DIAGNOSIS — Z794 Long term (current) use of insulin: Secondary | ICD-10-CM | POA: Diagnosis not present

## 2016-07-16 DIAGNOSIS — E669 Obesity, unspecified: Secondary | ICD-10-CM | POA: Diagnosis not present

## 2016-07-18 DIAGNOSIS — E785 Hyperlipidemia, unspecified: Secondary | ICD-10-CM | POA: Diagnosis not present

## 2016-07-18 DIAGNOSIS — I10 Essential (primary) hypertension: Secondary | ICD-10-CM | POA: Diagnosis not present

## 2016-07-18 DIAGNOSIS — E1169 Type 2 diabetes mellitus with other specified complication: Secondary | ICD-10-CM | POA: Diagnosis not present

## 2016-07-18 DIAGNOSIS — I2510 Atherosclerotic heart disease of native coronary artery without angina pectoris: Secondary | ICD-10-CM | POA: Diagnosis not present

## 2016-07-18 DIAGNOSIS — E559 Vitamin D deficiency, unspecified: Secondary | ICD-10-CM | POA: Diagnosis not present

## 2016-07-18 DIAGNOSIS — N184 Chronic kidney disease, stage 4 (severe): Secondary | ICD-10-CM | POA: Diagnosis not present

## 2016-07-18 DIAGNOSIS — I5032 Chronic diastolic (congestive) heart failure: Secondary | ICD-10-CM | POA: Diagnosis not present

## 2016-07-18 DIAGNOSIS — Z794 Long term (current) use of insulin: Secondary | ICD-10-CM | POA: Diagnosis not present

## 2016-07-18 DIAGNOSIS — E1165 Type 2 diabetes mellitus with hyperglycemia: Secondary | ICD-10-CM | POA: Diagnosis not present

## 2016-07-21 ENCOUNTER — Encounter: Payer: Self-pay | Admitting: Physician Assistant

## 2016-07-24 DIAGNOSIS — R0602 Shortness of breath: Secondary | ICD-10-CM | POA: Diagnosis not present

## 2016-07-24 DIAGNOSIS — Z48812 Encounter for surgical aftercare following surgery on the circulatory system: Secondary | ICD-10-CM | POA: Diagnosis not present

## 2016-07-24 DIAGNOSIS — E1129 Type 2 diabetes mellitus with other diabetic kidney complication: Secondary | ICD-10-CM | POA: Diagnosis not present

## 2016-07-24 DIAGNOSIS — I5033 Acute on chronic diastolic (congestive) heart failure: Secondary | ICD-10-CM | POA: Diagnosis not present

## 2016-07-24 DIAGNOSIS — Z951 Presence of aortocoronary bypass graft: Secondary | ICD-10-CM | POA: Diagnosis not present

## 2016-07-30 DIAGNOSIS — I10 Essential (primary) hypertension: Secondary | ICD-10-CM | POA: Diagnosis not present

## 2016-07-30 DIAGNOSIS — N184 Chronic kidney disease, stage 4 (severe): Secondary | ICD-10-CM | POA: Diagnosis not present

## 2016-08-24 DIAGNOSIS — E1129 Type 2 diabetes mellitus with other diabetic kidney complication: Secondary | ICD-10-CM | POA: Diagnosis not present

## 2016-08-24 DIAGNOSIS — Z48812 Encounter for surgical aftercare following surgery on the circulatory system: Secondary | ICD-10-CM | POA: Diagnosis not present

## 2016-08-24 DIAGNOSIS — Z951 Presence of aortocoronary bypass graft: Secondary | ICD-10-CM | POA: Diagnosis not present

## 2016-09-09 DIAGNOSIS — I10 Essential (primary) hypertension: Secondary | ICD-10-CM | POA: Diagnosis not present

## 2016-09-09 DIAGNOSIS — I5032 Chronic diastolic (congestive) heart failure: Secondary | ICD-10-CM | POA: Diagnosis not present

## 2016-09-09 DIAGNOSIS — E785 Hyperlipidemia, unspecified: Secondary | ICD-10-CM | POA: Diagnosis not present

## 2016-09-09 DIAGNOSIS — N184 Chronic kidney disease, stage 4 (severe): Secondary | ICD-10-CM | POA: Diagnosis not present

## 2016-09-09 DIAGNOSIS — E1165 Type 2 diabetes mellitus with hyperglycemia: Secondary | ICD-10-CM | POA: Diagnosis not present

## 2016-09-09 DIAGNOSIS — I2510 Atherosclerotic heart disease of native coronary artery without angina pectoris: Secondary | ICD-10-CM | POA: Diagnosis not present

## 2016-09-09 DIAGNOSIS — E559 Vitamin D deficiency, unspecified: Secondary | ICD-10-CM | POA: Diagnosis not present

## 2016-09-09 DIAGNOSIS — E1169 Type 2 diabetes mellitus with other specified complication: Secondary | ICD-10-CM | POA: Diagnosis not present

## 2016-09-09 DIAGNOSIS — Z794 Long term (current) use of insulin: Secondary | ICD-10-CM | POA: Diagnosis not present

## 2016-09-20 ENCOUNTER — Emergency Department (HOSPITAL_BASED_OUTPATIENT_CLINIC_OR_DEPARTMENT_OTHER): Payer: Medicare HMO

## 2016-09-20 ENCOUNTER — Emergency Department (HOSPITAL_BASED_OUTPATIENT_CLINIC_OR_DEPARTMENT_OTHER)
Admission: EM | Admit: 2016-09-20 | Discharge: 2016-09-20 | Disposition: A | Discharge status: Home / Self Care | Payer: Medicare HMO | Attending: Emergency Medicine | Admitting: Emergency Medicine

## 2016-09-20 ENCOUNTER — Encounter (HOSPITAL_BASED_OUTPATIENT_CLINIC_OR_DEPARTMENT_OTHER): Payer: Self-pay | Admitting: *Deleted

## 2016-09-20 DIAGNOSIS — Z794 Long term (current) use of insulin: Secondary | ICD-10-CM | POA: Diagnosis not present

## 2016-09-20 DIAGNOSIS — I5032 Chronic diastolic (congestive) heart failure: Secondary | ICD-10-CM | POA: Diagnosis not present

## 2016-09-20 DIAGNOSIS — N183 Chronic kidney disease, stage 3 (moderate): Secondary | ICD-10-CM | POA: Diagnosis not present

## 2016-09-20 DIAGNOSIS — Z87891 Personal history of nicotine dependence: Secondary | ICD-10-CM | POA: Diagnosis not present

## 2016-09-20 DIAGNOSIS — I2510 Atherosclerotic heart disease of native coronary artery without angina pectoris: Secondary | ICD-10-CM | POA: Insufficient documentation

## 2016-09-20 DIAGNOSIS — R109 Unspecified abdominal pain: Secondary | ICD-10-CM | POA: Diagnosis not present

## 2016-09-20 DIAGNOSIS — N3000 Acute cystitis without hematuria: Secondary | ICD-10-CM | POA: Insufficient documentation

## 2016-09-20 DIAGNOSIS — J45909 Unspecified asthma, uncomplicated: Secondary | ICD-10-CM | POA: Insufficient documentation

## 2016-09-20 DIAGNOSIS — I700 Atherosclerosis of aorta: Secondary | ICD-10-CM | POA: Diagnosis not present

## 2016-09-20 DIAGNOSIS — Z7982 Long term (current) use of aspirin: Secondary | ICD-10-CM | POA: Diagnosis not present

## 2016-09-20 DIAGNOSIS — E1122 Type 2 diabetes mellitus with diabetic chronic kidney disease: Secondary | ICD-10-CM | POA: Diagnosis not present

## 2016-09-20 DIAGNOSIS — I130 Hypertensive heart and chronic kidney disease with heart failure and stage 1 through stage 4 chronic kidney disease, or unspecified chronic kidney disease: Secondary | ICD-10-CM | POA: Insufficient documentation

## 2016-09-20 DIAGNOSIS — Z79899 Other long term (current) drug therapy: Secondary | ICD-10-CM | POA: Insufficient documentation

## 2016-09-20 LAB — URINALYSIS, ROUTINE W REFLEX MICROSCOPIC
Bilirubin Urine: NEGATIVE
GLUCOSE, UA: NEGATIVE mg/dL
KETONES UR: NEGATIVE mg/dL
NITRITE: POSITIVE — AB
PH: 6 (ref 5.0–8.0)
Protein, ur: 100 mg/dL — AB
SPECIFIC GRAVITY, URINE: 1.01 (ref 1.005–1.030)

## 2016-09-20 LAB — CBC WITH DIFFERENTIAL/PLATELET
BASOS ABS: 0 10*3/uL (ref 0.0–0.1)
BASOS PCT: 0 %
Eosinophils Absolute: 0.2 10*3/uL (ref 0.0–0.7)
Eosinophils Relative: 3 %
HEMATOCRIT: 32.4 % — AB (ref 36.0–46.0)
HEMOGLOBIN: 10.6 g/dL — AB (ref 12.0–15.0)
LYMPHS PCT: 43 %
Lymphs Abs: 3.5 10*3/uL (ref 0.7–4.0)
MCH: 28.9 pg (ref 26.0–34.0)
MCHC: 32.7 g/dL (ref 30.0–36.0)
MCV: 88.3 fL (ref 78.0–100.0)
MONO ABS: 0.6 10*3/uL (ref 0.1–1.0)
Monocytes Relative: 7 %
NEUTROS ABS: 3.8 10*3/uL (ref 1.7–7.7)
NEUTROS PCT: 47 %
Platelets: 215 10*3/uL (ref 150–400)
RBC: 3.67 MIL/uL — ABNORMAL LOW (ref 3.87–5.11)
RDW: 13.9 % (ref 11.5–15.5)
WBC: 8.2 10*3/uL (ref 4.0–10.5)

## 2016-09-20 LAB — URINALYSIS, MICROSCOPIC (REFLEX)

## 2016-09-20 LAB — COMPREHENSIVE METABOLIC PANEL
ALBUMIN: 3.8 g/dL (ref 3.5–5.0)
ALK PHOS: 58 U/L (ref 38–126)
ALT: 26 U/L (ref 14–54)
ANION GAP: 8 (ref 5–15)
AST: 21 U/L (ref 15–41)
BUN: 31 mg/dL — ABNORMAL HIGH (ref 6–20)
CO2: 28 mmol/L (ref 22–32)
CREATININE: 1.7 mg/dL — AB (ref 0.44–1.00)
Calcium: 9.1 mg/dL (ref 8.9–10.3)
Chloride: 101 mmol/L (ref 101–111)
GFR calc Af Amer: 31 mL/min — ABNORMAL LOW (ref >60)
GFR calc non Af Amer: 26 mL/min — ABNORMAL LOW (ref >60)
GLUCOSE: 156 mg/dL — AB (ref 65–99)
Potassium: 3.4 mmol/L — ABNORMAL LOW (ref 3.5–5.1)
SODIUM: 137 mmol/L (ref 135–145)
TOTAL PROTEIN: 7.5 g/dL (ref 6.5–8.1)
Total Bilirubin: 0.7 mg/dL (ref 0.3–1.2)

## 2016-09-20 MED ORDER — CEPHALEXIN 500 MG PO CAPS: 500.0000 mg | ORAL_CAPSULE | Freq: Two times a day (BID) | ORAL | 0 refills | Status: DC

## 2016-09-20 MED ORDER — CEPHALEXIN 250 MG PO CAPS
500.0000 mg | ORAL_CAPSULE | Freq: Once | ORAL | Status: AC
  Administered 2016-09-20: 500 mg via ORAL
  Filled 2016-09-20: qty 2

## 2016-09-20 NOTE — ED Triage Notes (Addendum)
Pt reports pinpoint reproducible pain in her right upper side since yesterday.  Pain increases with palpation and movement.

## 2016-09-20 NOTE — ED Notes (Signed)
Alert, NAD, calm, interactive, resps e/u, speaking in clear complete sentences, no dyspnea noted, skin W&D, VSS, c/o R flank/side/ mid back pain, worse with movement, (denies: sob, nausea, dizziness or visual changes). Family at [facility].

## 2016-09-20 NOTE — ED Notes (Signed)
Pt and family member given d/c instructions as per chart Rx x 1. Verbalizes understanding. No questions.

## 2016-09-20 NOTE — ED Notes (Addendum)
Attempted IV x4, unsuccessful (2 with [place]), tolerated well, remains alert, NAD, calm, interactive, c/o R flank pain, family at [facility], blood obtained and sent. Radiology notified pt ready.

## 2016-09-20 NOTE — ED Notes (Signed)
Ambulatory to b/r with EMT assist, family present. Steady gait, NAD, calm.

## 2016-09-20 NOTE — ED Provider Notes (Signed)
[facility] DEPT [facility] Provider Note   CSN: [id number] Arrival date & time: 09/20/16  1713  By signing my name below, I, Dora Sims, attest that this documentation has been prepared under the direction and in the presence of physician practitioner, Quintella Reichert, MD. Electronically Signed: Dora Sims, Scribe. 09/20/2016. 9:01 PM.  History   Chief Complaint Chief Complaint  Patient presents with  . Flank Pain    The history is provided by the patient. No language interpreter was used.     HPI Comments: Monica [name] is a 83 y.o. female anticoagulated with aspirin, with PMHx including CHF, CKD, CAD, HTN, HLD, and DM type II, who presents to the Emergency Department complaining of intermittent, gradually worsening, right flank pain beginning last night. No associated symptoms noted. Pt denies pain while laying flat on her back but endorses severe pain with any other position. She denies pain exacerbation with respiration or with applied pressure to her right flank. No recent over-exertion or injuries. She states she has been eating and urinating normally. Pt endorses some bilateral leg swelling at baseline since a surgery last year but denies acute change in this condition. She denies fever, chest pain, cough, SOB, abdominal pain, nausea, vomiting, hematuria, dysuria, malodorous urine, or any other associated symptoms.  Past Medical History:  Diagnosis Date  . Asthma   . Bilateral pleural effusion    a. s/p CABG >> s/p bilat thoracentesis 11/2015  . Candida infection 10/2015   Bilateral breasts  . Chronic diastolic CHF (congestive heart failure) ([place])   . CKD (chronic kidney disease), stage III   . Coronary artery disease involving native coronary artery of native heart without angina pectoris    a. LHC 3/17 >> 3 v CAD >> CABG  . Essential hypertension   . GERD (gastroesophageal reflux disease)   . History of blood transfusion   . History of echocardiogram    a. Echo 3/17: Mild  focal basal septal hypertrophy, vigorous LVF, EF 65-70%, normal wall motion, grade 1 diastolic dysfunction, MAC  . Hyperlipidemia   . Mass   . Peptic ulcer   . Protein calorie malnutrition ([place])   . Type 2 diabetes mellitus [facility])     Patient Active Problem List   Diagnosis Date Noted  . Bilateral pleural effusion 11/23/2015  . Chronic diastolic CHF (congestive heart failure) ([place]) 11/23/2015  . Shortness of breath 11/23/2015  . Pleural effusion 11/23/2015  . CAD (coronary artery disease) 10/05/2015  . Altered mental status   . S/P CABG x 4 09/19/2015  . Abnormal finding on EKG - dynamic changes 09/17/2015  . Pain in the chest   . Essential hypertension   . GERD (gastroesophageal reflux disease) 09/15/2015  . Gout 09/15/2015  . Chest pain 09/15/2015  . Hyperlipidemia   . Diabetes mellitus with renal complications ([place])   . Asthma   . CKD (chronic kidney disease), stage III   . Metatarsalgia of both feet 07/25/2014  . Equinus deformity of foot, acquired 07/25/2014  . Onychomycosis 07/25/2014  . Pain in lower limb 07/25/2014    Past Surgical History:  Procedure Laterality Date  . ABDOMINAL HYSTERECTOMY    . CARDIAC CATHETERIZATION N/A 09/17/2015   Procedure: Left Heart Cath and Coronary Angiography;  Surgeon: Jettie Booze, MD;  Location: [facility];  Service: Cardiovascular;  Laterality: N/A;  . COLON SURGERY    . CORONARY ARTERY BYPASS GRAFT N/A 09/19/2015   Procedure: CORONARY ARTERY BYPASS GRAFTING (CABG) TIMES FOUR USING BILATARAL  SAPHENOUS VEIN GRAFTS AND LEFT INTERNAL MAMMARY ARTERY;  Surgeon: [name], MD;  Location: [facility];  Service: Open Heart Surgery;  Laterality: N/A;  . TEE WITHOUT CARDIOVERSION N/A 09/19/2015   Procedure: TRANSESOPHAGEAL ECHOCARDIOGRAM (TEE);  Surgeon: [name], MD;  Location: [facility];  Service: Open Heart Surgery;  Laterality: N/A;    OB History    No data available       Home Medications    Prior to Admission  medications   Medication Sig Start Date End Date Taking? Authorizing Provider  albuterol (PROVENTIL HFA;VENTOLIN HFA) 108 (90 Base) MCG/ACT inhaler Inhale 2 puffs into the lungs every 6 (six) hours as needed for wheezing or shortness of breath.    Historical Provider, MD  amLODipine (NORVASC) 10 MG tablet Take 10 mg by mouth daily.    Historical Provider, MD  aspirin EC 81 MG EC tablet Take 1 tablet (81 mg total) by mouth daily. 10/03/15   Erin R Barrett, PA-C  atorvastatin (LIPITOR) 80 MG tablet Take 80 mg by mouth daily.    Historical Provider, MD  Calcium Carb-Cholecalciferol [phone] MG-UNIT CAPS Take 1 tablet by mouth daily.    Historical Provider, MD  calcium carbonate (TUMS - DOSED IN MG ELEMENTAL CALCIUM) 500 MG chewable tablet Chew 1 tablet by mouth daily as needed for indigestion or heartburn.    Historical Provider, MD  cephALEXin (KEFLEX) 500 MG capsule Take 1 capsule (500 mg total) by mouth 2 (two) times daily. 09/20/16   Quintella Reichert, MD  Cholecalciferol (VITAMIN D-3) 1000 units CAPS Take 1 capsule by mouth daily.    Historical Provider, MD  diphenhydrAMINE (BENADRYL) 25 MG tablet Take 25 mg by mouth as needed for itching or allergies.     Historical Provider, MD  ferrous sulfate 325 (65 FE) MG tablet Take 325 mg by mouth daily with breakfast.    Historical Provider, MD  furosemide (LASIX) 20 MG tablet Take 2 tabs (40 mg total) by mouth at 8 am, and then take 1 tab (20 mg total) by mouth at 2 pm daily Patient taking differently: Take 2 tabs (40 mg total) by mouth at 8 am. 11/23/15   Dorothy Spark, MD  insulin glargine (LANTUS) 100 UNIT/ML injection INJECT 15 UNITS INTO THE SKIN IN THE MORNING, 10 UNITS INTO THE SKIN AT NIGHT BEFORE BEDTIME    Historical Provider, MD  metoprolol tartrate (LOPRESSOR) 25 MG tablet Take 1 tablet (25 mg total) by mouth 2 (two) times daily. 12/17/15   Dorothy Spark, MD  nitroGLYCERIN (NITROSTAT) 0.4 MG SL tablet PLACE 1 TABLET BY MOUTH UNDER THE TONGUE  EVERY 5 MINUTES AS NEEDED FOR CHEST PAIN. FOR UP TO 3 DOSES. 04/03/16   Dorothy Spark, MD  nystatin (MYCOSTATIN) powder Apply topically 2 (two) times daily. 100,000 units/gm powder applied under bilateral breasts BID x3 weeks    Historical Provider, MD    Family History Family History  Problem Relation Age of Onset  . Heart attack Father     Social History Social History  Substance Use Topics  . Smoking status: Never Smoker  . Smokeless tobacco: Former Systems developer    Types: Snuff  . Alcohol use No     Allergies   Patient has no known allergies.   Review of Systems Review of Systems  Constitutional: Negative for fever.  Respiratory: Negative for cough and shortness of breath.   Cardiovascular: Positive for leg swelling (baseline). Negative for chest pain.  Gastrointestinal: Negative for  abdominal pain, nausea and vomiting.  Genitourinary: Positive for flank pain (R). Negative for dysuria and hematuria.  All other systems reviewed and are negative.   Physical Exam Updated Vital Signs BP (!) 198/80   Pulse 82   Temp 98.1 F (36.7 C) (Oral)   Resp 18   Ht 5' 2"  (1.575 m)   Wt 170 lb (77.1 kg)   SpO2 95%   BMI 31.09 kg/m   Physical Exam  Constitutional: She is oriented to person, place, and time. She appears well-developed and well-nourished.  HENT:  Head: Normocephalic and atraumatic.  Cardiovascular: Normal rate and regular rhythm.   No murmur heard. Pulmonary/Chest: Effort normal and breath sounds normal. No respiratory distress.  Abdominal: Soft. There is CVA tenderness (right). There is no rebound and no guarding.  Musculoskeletal: She exhibits edema. She exhibits no tenderness.  1+ pitting edema bilateral lower extremities.  Neurological: She is alert and oriented to person, place, and time.  Skin: Skin is warm and dry.  Psychiatric: She has a normal mood and affect. Her behavior is normal.  Nursing note and vitals reviewed.   ED Treatments / Results   Labs (all labs ordered are listed, but only abnormal results are displayed) Labs Reviewed  COMPREHENSIVE METABOLIC PANEL - Abnormal; Notable for the following:       Result Value   Potassium 3.4 (*)    Glucose, Bld 156 (*)    BUN 31 (*)    Creatinine, Ser 1.70 (*)    GFR calc non Af Amer 26 (*)    GFR calc Af Amer 31 (*)    All other components within normal limits  CBC WITH DIFFERENTIAL/PLATELET - Abnormal; Notable for the following:    RBC 3.67 (*)    Hemoglobin 10.6 (*)    HCT 32.4 (*)    All other components within normal limits  URINALYSIS, ROUTINE W REFLEX MICROSCOPIC - Abnormal; Notable for the following:    Color, Urine ORANGE (*)    Hgb urine dipstick SMALL (*)    Protein, ur 100 (*)    Nitrite POSITIVE (*)    Leukocytes, UA TRACE (*)    All other components within normal limits  URINALYSIS, MICROSCOPIC (REFLEX) - Abnormal; Notable for the following:    Bacteria, UA RARE (*)    Squamous Epithelial / LPF 0-5 (*)    All other components within normal limits  URINE CULTURE    EKG  EKG Interpretation None       Radiology Dg Chest 2 View  Result Date: 09/20/2016 CLINICAL DATA:  83 year old female with history of right-sided flank pain since yesterday evening. EXAM: CHEST  2 VIEW COMPARISON:  Chest x-ray 01/24/2016. FINDINGS: Lung volumes are normal. No consolidative airspace disease. No pleural effusions. No pneumothorax. No pulmonary nodule or mass noted. Pulmonary vasculature and the cardiomediastinal silhouette are within normal limits. Atherosclerosis in the thoracic aorta. Status post median sternotomy for CABG. IMPRESSION: 1.  No radiographic evidence of acute cardiopulmonary disease. 2. Aortic atherosclerosis. Electronically Signed   By: Vinnie Langton M.D.   On: 09/20/2016 23:05   Ct Renal Stone Study  Result Date: 09/20/2016 CLINICAL DATA:  Right flank pain since last p.m. History of coronary disease, hypertension, CHF, asthma, coronary bypass graft,  chronic kidney disease, diabetes, gastroesophageal reflux disease. EXAM: CT ABDOMEN AND PELVIS WITHOUT CONTRAST TECHNIQUE: Multidetector CT imaging of the abdomen and pelvis was performed following the standard protocol without IV contrast. COMPARISON:  01/10/2004 FINDINGS: Lower chest: Lung bases  are clear. Small esophageal hiatal hernia. Postoperative change in the mediastinum. Hepatobiliary: Gallbladder and bile ducts are unremarkable. No focal liver lesions. Prominent calcification in the intrahepatic arteries. Pancreas: Unremarkable. No pancreatic ductal dilatation or surrounding inflammatory changes. Spleen: Normal in size without focal abnormality. Adrenals/Urinary Tract: No adrenal gland nodules. Kidneys are symmetrical in size. No hydronephrosis or hydroureter. No renal or ureteral stones are seen. Calcification in the renal arteries. Bladder is decompressed. No filling defects identified. Stomach/Bowel: Stomach and small bowel are mostly decompressed. Previous right hemicolectomy with ileal transverse colonic anastomosis. Scattered stool in the colon. No colonic distention or wall thickening. Scattered diverticula in the colon. No evidence of diverticulitis. Vascular/Lymphatic: Aortic atherosclerosis. No enlarged abdominal or pelvic lymph nodes. Reproductive: Status post hysterectomy. No adnexal masses. Other: Small ventral abdominal wall hernias along the midline containing fat. No bowel herniation. Fat in the inguinal canals bilaterally. No free air or free fluid in the abdomen. Musculoskeletal: Degenerative changes in the spine. No destructive bone lesions. IMPRESSION: No renal or ureteral stone or obstruction. Extensive vascular calcifications throughout the abdomen and pelvis. Aortic atherosclerosis. No evidence of bowel obstruction or inflammation. Electronically Signed   By: Lucienne Capers M.D.   On: 09/20/2016 23:05    Procedures Procedures (including critical care time)  DIAGNOSTIC  STUDIES: Oxygen Saturation is 96% on RA, adequate by my interpretation.    COORDINATION OF CARE: 11:24 PM Discussed treatment plan with pt at bedside and pt agreed to plan.  Medications Ordered in ED Medications  cephALEXin (KEFLEX) capsule 500 mg (not administered)     Initial Impression / Assessment and Plan / ED Course  I have reviewed the triage vital signs and the nursing notes.  Pertinent labs & imaging results that were available during my care of the patient were reviewed by me and considered in my medical decision making (see chart for details).     Patient with history of kidney disease and vascular disease here for evaluation of right flank pain, no recollection of injuries. She is in no distress in the emergency Department and declines any pain medications. She does have right CVA tenderness. She is neurologically and intact on examination and well-perfused. UA is concerning for UTI. Flank pain is consistent with musculoskeletal pain. Discussed with patient findings of CT scan, labs. Recommend home care for right flank pain with rest and Tylenol. We'll treat her UTI with antibiotics. Counseled pt on home care, close outpatient follow-up as well as return precautions. Presentation is not consistent with sepsis, dissection, PE, pneumonia.  Final Clinical Impressions(s) / ED Diagnoses   Final diagnoses:  Right flank pain  Acute cystitis without hematuria    New Prescriptions New Prescriptions   CEPHALEXIN (KEFLEX) 500 MG CAPSULE    Take 1 capsule (500 mg total) by mouth 2 (two) times daily.   I personally performed the services described in this documentation, which was scribed in my presence. The recorded information has been reviewed and is accurate.    Quintella Reichert, MD 09/20/16 [id number]

## 2016-09-21 DIAGNOSIS — Z48812 Encounter for surgical aftercare following surgery on the circulatory system: Secondary | ICD-10-CM | POA: Diagnosis not present

## 2016-09-21 DIAGNOSIS — Z951 Presence of aortocoronary bypass graft: Secondary | ICD-10-CM | POA: Diagnosis not present

## 2016-09-21 DIAGNOSIS — E1129 Type 2 diabetes mellitus with other diabetic kidney complication: Secondary | ICD-10-CM | POA: Diagnosis not present

## 2016-09-22 LAB — URINE CULTURE: Culture: <10000 — AB

## 2016-09-24 ENCOUNTER — Ambulatory Visit (INDEPENDENT_AMBULATORY_CARE_PROVIDER_SITE_OTHER): Payer: Medicare HMO | Admitting: Cardiology

## 2016-09-24 VITALS — BP 126/82 | HR 70 | Ht 62.0 in | Wt 180.0 lb

## 2016-09-24 DIAGNOSIS — I5033 Acute on chronic diastolic (congestive) heart failure: Secondary | ICD-10-CM | POA: Diagnosis not present

## 2016-09-24 DIAGNOSIS — I509 Heart failure, unspecified: Secondary | ICD-10-CM | POA: Diagnosis not present

## 2016-09-24 DIAGNOSIS — E782 Mixed hyperlipidemia: Secondary | ICD-10-CM | POA: Diagnosis not present

## 2016-09-24 DIAGNOSIS — I2510 Atherosclerotic heart disease of native coronary artery without angina pectoris: Secondary | ICD-10-CM | POA: Diagnosis not present

## 2016-09-24 DIAGNOSIS — Z951 Presence of aortocoronary bypass graft: Secondary | ICD-10-CM | POA: Diagnosis not present

## 2016-09-24 DIAGNOSIS — N183 Chronic kidney disease, stage 3 unspecified: Secondary | ICD-10-CM

## 2016-09-24 DIAGNOSIS — I10 Essential (primary) hypertension: Secondary | ICD-10-CM | POA: Diagnosis not present

## 2016-09-24 MED ORDER — FUROSEMIDE 40 MG PO TABS: ORAL_TABLET | ORAL | 0 refills | Status: DC

## 2016-09-24 NOTE — Progress Notes (Signed)
Cardiology Office Note:    Date:  09/24/2016   ID:  Yumalay Circle, DOB [birth year], MRN [id number]  PCP:  Benito Mccreedy, MD  Cardiologist:  Dr. Ena Dawley   Electrophysiologist:  N/a Nephrologist: Dr. Joesph July [facility])  Referring MD: Benito Mccreedy, MD   Chief complain: LE edema  History of Present Illness:     Monica [name] is a 83 y.o. female with a hx of CAD s/p CABG in 3/17, HTN, HL, diabetes, asthma, GERD, gout, CKD, PUD. Admitted in 3/17 with unstable angina. LHC demonstrated severe 3 vessel CAD and she underwent CABG with LIMA-LAD, SVG-intermediate, SVG-OM, SVG-distal RCA. Postoperative course was complicated by blood loss anemia requiring transfusion with PRBCs.  Since her surgery, she has undergone bilateral thoracenteses for persistent pleural effusions.  Last seen by Dr. Servando Snare 12/13/15.    06/27/2016 - 3 months follow up, she is feeling well, denies any further chest pain, she has stable dyspnea on moderate exertion. She has been compliant with her medicines with no side effects. No orthopnea or personal nocturnal dyspnea.  09/24/2016 - 3 months follow-up, the patient and she has been feeling well she was placed by primary care physician increased her fluid intake as she has been losing weight and was able to gain 16 pounds in the last 3 months. She has noticed worsening lower edema bilaterally. She was previously taking Lasix 40 mg twice a day and was advised by her nephrologist to cut down. Denies any orthopnea or paroxysmal nocturnal dyspnea. No palpitations or chest pain.   Past Medical History:  Diagnosis Date  . Asthma   . Bilateral pleural effusion    a. s/p CABG >> s/p bilat thoracentesis 11/2015  . Candida infection 10/2015   Bilateral breasts  . Chronic diastolic CHF (congestive heart failure) ([place])   . CKD (chronic kidney disease), stage III   . Coronary artery disease involving native coronary artery of native heart without angina pectoris    a. LHC 3/17  >> 3 v CAD >> CABG  . Essential hypertension   . GERD (gastroesophageal reflux disease)   . History of blood transfusion   . History of echocardiogram    a. Echo 3/17: Mild focal basal septal hypertrophy, vigorous LVF, EF 65-70%, normal wall motion, grade 1 diastolic dysfunction, MAC  . Hyperlipidemia   . Mass   . Peptic ulcer   . Protein calorie malnutrition ([place])   . Type 2 diabetes mellitus ([place])     Past Surgical History:  Procedure Laterality Date  . ABDOMINAL HYSTERECTOMY    . CARDIAC CATHETERIZATION N/A 09/17/2015   Procedure: Left Heart Cath and Coronary Angiography;  Surgeon: Jettie Booze, MD;  Location: [place] [facility];  Service: Cardiovascular;  Laterality: N/A;  . COLON SURGERY    . CORONARY ARTERY BYPASS GRAFT N/A 09/19/2015   Procedure: CORONARY ARTERY BYPASS GRAFTING (CABG) TIMES FOUR USING BILATARAL SAPHENOUS VEIN GRAFTS AND LEFT INTERNAL MAMMARY ARTERY;  Surgeon: [name], MD;  Location: [facility];  Service: Open Heart Surgery;  Laterality: N/A;  . TEE WITHOUT CARDIOVERSION N/A 09/19/2015   Procedure: TRANSESOPHAGEAL ECHOCARDIOGRAM (TEE);  Surgeon: [name], MD;  Location: [facility];  Service: Open Heart Surgery;  Laterality: N/A;    Current Medications: Outpatient Medications Prior to Visit  Medication Sig Dispense Refill  . albuterol (PROVENTIL HFA;VENTOLIN HFA) 108 (90 Base) MCG/ACT inhaler Inhale 2 puffs into the lungs every 6 (six) hours as needed for wheezing or shortness of breath.    Marland Kitchen  amLODipine (NORVASC) 10 MG tablet Take 10 mg by mouth daily.    Marland Kitchen aspirin EC 81 MG EC tablet Take 1 tablet (81 mg total) by mouth daily.    Marland Kitchen atorvastatin (LIPITOR) 80 MG tablet Take 80 mg by mouth daily.     . Calcium Carb-Cholecalciferol [phone] MG-UNIT CAPS Take 1 tablet by mouth daily.    . calcium carbonate (TUMS - DOSED IN MG ELEMENTAL CALCIUM) 500 MG chewable tablet Chew 1 tablet by mouth daily as needed for indigestion or heartburn.    . cephALEXin  (KEFLEX) 500 MG capsule Take 1 capsule (500 mg total) by mouth 2 (two) times daily. 14 capsule 0  . Cholecalciferol (VITAMIN D-3) 1000 units CAPS Take 1 capsule by mouth daily.    . diphenhydrAMINE (BENADRYL) 25 MG tablet Take 25 mg by mouth as needed for itching or allergies.     . ferrous sulfate 325 (65 FE) MG tablet Take 325 mg by mouth daily with breakfast.    . insulin glargine (LANTUS) 100 UNIT/ML injection INJECT 15 UNITS INTO THE SKIN IN THE MORNING, 10 UNITS INTO THE SKIN AT NIGHT BEFORE BEDTIME    . metoprolol tartrate (LOPRESSOR) 25 MG tablet Take 1 tablet (25 mg total) by mouth 2 (two) times daily. 180 tablet 3  . nitroGLYCERIN (NITROSTAT) 0.4 MG SL tablet PLACE 1 TABLET BY MOUTH UNDER THE TONGUE EVERY 5 MINUTES AS NEEDED FOR CHEST PAIN. FOR UP TO 3 DOSES. 100 tablet 3  . nystatin (MYCOSTATIN) powder Apply topically 2 (two) times daily. 100,000 units/gm powder applied under bilateral breasts BID x3 weeks    . furosemide (LASIX) 20 MG tablet Take 2 tabs (40 mg total) by mouth at 8 am, and then take 1 tab (20 mg total) by mouth at 2 pm daily (Patient not taking: Reported on 09/24/2016) 270 tablet 3   No facility-administered medications prior to visit.       Allergies:   Patient has no known allergies.   Social History   Social History  . Marital status: Widowed    Spouse name: N/A  . Number of children: N/A  . Years of education: N/A   Occupational History  . Retired    Social History Main Topics  . Smoking status: Never Smoker  . Smokeless tobacco: Former Systems developer    Types: Snuff  . Alcohol use No  . Drug use: No  . Sexual activity: Not on file   Other Topics Concern  . Not on file   Social History Narrative  . No narrative on file     Family History:  The patient's family history includes Heart attack in her father.   ROS:   Please see the history of present illness.    Review of Systems  Cardiovascular: Positive for leg swelling.  Neurological: Positive for  loss of balance.   All other systems reviewed and are negative.   Physical Exam:    VS:  BP 126/82   Pulse 70   Ht 5' 2"  (1.575 m)   Wt 180 lb (81.6 kg)   BMI 32.92 kg/m    Physical Exam  Constitutional: She is oriented to person, place, and time. She appears well-developed and well-nourished.  HENT:  Head: Normocephalic and atraumatic.  Neck: Neck supple. No JVD present.  Cardiovascular: Normal rate, regular rhythm and normal heart sounds.   No murmur heard. Pulmonary/Chest: Effort normal and breath sounds normal. She has no wheezes. She has no rales.  Abdominal: Soft. There  is no tenderness.  Musculoskeletal:  1+ bilateral LE edema - compression stockings in place  Neurological: She is alert and oriented to person, place, and time.  Skin: Skin is warm and dry.  Psychiatric: She has a normal mood and affect.    Wt Readings from Last 3 Encounters:  09/24/16 180 lb (81.6 kg)  09/20/16 170 lb (77.1 kg)  06/26/16 163 lb (73.9 kg)      Studies/Labs Reviewed:     EKG:  EKG is  ordered today.  The ekg ordered today demonstrates NSR, HR 74, normal axis, PRWP, QTc 472 ms, no sig changes.   Recent Labs: 11/23/2015: Brain Natriuretic Peptide 162.6 06/26/2016: TSH 3.85 09/20/2016: ALT 26; BUN 31; Creatinine, Ser 1.70; Hemoglobin 10.6; Platelets 215; Potassium 3.4; Sodium 137   Recent Lipid Panel    Component Value Date/Time   CHOL 139 06/26/2016 1004   TRIG 98 06/26/2016 1004   HDL 49 (L) 06/26/2016 1004   CHOLHDL 2.8 06/26/2016 1004   VLDL 20 06/26/2016 1004   LDLCALC 70 06/26/2016 1004    Additional studies/ records that were reviewed today include:   Carotid US 09/18/15 Bilateral ICA 1-39%  LHC 09/17/15 LAD proximal 90%, D2 80% LCx ostial 99% RCA proximal 60%, distal 75%, RPDA 25% Severe three vessel disease. Plan for CVTS consult for CABG. She needs BP control, beta blocker and statin.  Echo 09/17/15 Mild focal basal septal hypertrophy, vigorous LVF, EF  65-70%, normal wall motion, grade 1 diastolic dysfunction, MAC   ASSESSMENT:     1. Acute on chronic congestive heart failure, unspecified congestive heart failure type ([place])   2. Acute on chronic diastolic CHF (congestive heart failure) ([place])   3. Essential hypertension   4. CKD (chronic kidney disease), stage III   5. S/P CABG x 3   6. Mixed hyperlipidemia   7. Coronary artery disease involving native coronary artery of native heart without angina pectoris     PLAN:     In order of problems listed above:  1. Acute on Chronic Diastolic CHF -mildly fluid overloaded, + 16 lbs in the last 3 months, partially weight gain, but suspicious of fluid overload. - increase lasix to 40 mg po BID x 1 week, then continue lasix 40 mg po daily, foloow up in 6 weeks with BNP and BMP prior to the visit.  2. CAD - s/p CABG. Asymptomatic. Continue ASA, statin, beta-blocker. Refill sl NTG.  3. HTN - controlled on current regimen.   4. HL - LDL 70, TG 98.  HDL 49. All at goal considering she is diabetic. Her PCP has recently decreased the dose of atorvastatin 80-40, unknown reason, I will recheck in 6 months and increase again if LDL > 70.  5. CKD - Managed by Nephrology in [place].  Follow-up in 6 weeks with  BNP and BMP prior to next visit.  Medication Adjustments/Labs and Tests Ordered: Current medicines are reviewed at length with the patient today.  Concerns regarding medicines are outlined above.  Medication changes, Labs and Tests ordered today are outlined in the Patient Instructions noted below. Patient Instructions  Medication Instructions:   TAKE LASIX (FUROSEMIDE) 40 MG BY MOUTH TWICE DAILY FOR ONE WEEK ONLY, THEN TAKE LASIX 40 MG BY MOUTH ONCE DAILY THEREAFTER.     Labwork:  A WEEK PRIOR TOO YOUR 6 WEEK FOLLOW-UP APPOINTMENT WITH DR [name] TO CHECK ---BMET AND PRO-BNP     Follow-Up:  6 WEEKS WITH DR Cabella Kimm---PLEASE HAVE YOUR LABS DONE  A WEEK PRIOR TO THIS  APPOINTMENT       If you need a refill on your cardiac medications before your next appointment, please call your pharmacy.    Signed, Ena Dawley, MD  09/24/2016 12:22 PM    [facility] [address], [place], [place]  [address] Phone: [phone]; Fax: [phone]

## 2016-09-24 NOTE — Patient Instructions (Signed)
Medication Instructions:   TAKE LASIX (FUROSEMIDE) 40 MG BY MOUTH TWICE DAILY FOR ONE WEEK ONLY, THEN TAKE LASIX 40 MG BY MOUTH ONCE DAILY THEREAFTER.     Labwork:  A WEEK PRIOR TOO YOUR 6 WEEK FOLLOW-UP APPOINTMENT WITH DR [name] TO CHECK ---BMET AND PRO-BNP     Follow-Up:  6 WEEKS WITH DR NELSON---PLEASE HAVE YOUR LABS DONE A WEEK PRIOR TO THIS APPOINTMENT       If you need a refill on your cardiac medications before your next appointment, please call your pharmacy.

## 2016-10-30 ENCOUNTER — Other Ambulatory Visit: Payer: Medicare HMO | Admitting: *Deleted

## 2016-10-30 DIAGNOSIS — I5033 Acute on chronic diastolic (congestive) heart failure: Secondary | ICD-10-CM | POA: Diagnosis not present

## 2016-10-30 DIAGNOSIS — Z951 Presence of aortocoronary bypass graft: Secondary | ICD-10-CM | POA: Diagnosis not present

## 2016-10-30 DIAGNOSIS — N183 Chronic kidney disease, stage 3 unspecified: Secondary | ICD-10-CM

## 2016-10-30 DIAGNOSIS — I509 Heart failure, unspecified: Secondary | ICD-10-CM

## 2016-10-30 DIAGNOSIS — I2510 Atherosclerotic heart disease of native coronary artery without angina pectoris: Secondary | ICD-10-CM

## 2016-10-30 DIAGNOSIS — I10 Essential (primary) hypertension: Secondary | ICD-10-CM | POA: Diagnosis not present

## 2016-10-30 DIAGNOSIS — E782 Mixed hyperlipidemia: Secondary | ICD-10-CM | POA: Diagnosis not present

## 2016-10-30 LAB — BASIC METABOLIC PANEL
BUN/Creatinine Ratio: 25 (ref 12–28)
BUN: 45 mg/dL — ABNORMAL HIGH (ref 8–27)
CO2: 24 mmol/L (ref 18–29)
Calcium: 9.1 mg/dL (ref 8.7–10.3)
Chloride: 99 mmol/L (ref 96–106)
Creatinine, Ser: 1.81 mg/dL — ABNORMAL HIGH (ref 0.57–1.00)
GFR calc Af Amer: 29 mL/min/{1.73_m2} — ABNORMAL LOW (ref >59)
GFR calc non Af Amer: 25 mL/min/{1.73_m2} — ABNORMAL LOW (ref >59)
Glucose: 167 mg/dL — ABNORMAL HIGH (ref 65–99)
Potassium: 4 mmol/L (ref 3.5–5.2)
Sodium: 140 mmol/L (ref 134–144)

## 2016-10-30 LAB — PRO B NATRIURETIC PEPTIDE: NT-Pro BNP: 1597 pg/mL — ABNORMAL HIGH (ref 0–738)

## 2016-11-04 ENCOUNTER — Other Ambulatory Visit: Payer: Self-pay | Admitting: Cardiology

## 2016-11-04 ENCOUNTER — Telehealth: Payer: Self-pay | Admitting: *Deleted

## 2016-11-04 DIAGNOSIS — I5032 Chronic diastolic (congestive) heart failure: Secondary | ICD-10-CM

## 2016-11-04 DIAGNOSIS — Z951 Presence of aortocoronary bypass graft: Secondary | ICD-10-CM

## 2016-11-04 DIAGNOSIS — I5033 Acute on chronic diastolic (congestive) heart failure: Secondary | ICD-10-CM

## 2016-11-04 DIAGNOSIS — N183 Chronic kidney disease, stage 3 unspecified: Secondary | ICD-10-CM

## 2016-11-04 DIAGNOSIS — I2510 Atherosclerotic heart disease of native coronary artery without angina pectoris: Secondary | ICD-10-CM

## 2016-11-04 DIAGNOSIS — I10 Essential (primary) hypertension: Secondary | ICD-10-CM

## 2016-11-04 DIAGNOSIS — E782 Mixed hyperlipidemia: Secondary | ICD-10-CM

## 2016-11-04 MED ORDER — FUROSEMIDE 40 MG PO TABS: 40.0000 mg | ORAL_TABLET | Freq: Two times a day (BID) | ORAL | 0 refills | Status: DC

## 2016-11-04 NOTE — Telephone Encounter (Signed)
-----   Message from Dorothy Spark, MD sent at 10/31/2016  5:52 PM EDT ----- She has signs of CHF, I would advise to increase lasix to 40 mg po BID until the next visit. We will repeat BMP and BNP at that time again.

## 2016-11-04 NOTE — Telephone Encounter (Signed)
Notified the pt that per Dr [name], her labs indicate that she has signs of CHF and recommends that we increase her lasix to 40 mg po bid until her next OV on 5/24 with her.   Informed the pt that per Dr [name], we will recheck a bmet and pro-bnp same day as her OV with Dr [name].  Advised the pt that she should take her lasix 40 mg po bid as taking her 1st dose at 0800 and her 2nd dose at 2 pm.  Confirmed the pharmacy of choice with the pt.  Scheduled the pt a lab appt for 5/24, same day as she see's Dr [name] in the clinic.  Pt verbalized understanding and agrees with this plan.

## 2016-11-06 ENCOUNTER — Ambulatory Visit: Payer: Medicare HMO | Admitting: Cardiology

## 2016-11-27 ENCOUNTER — Ambulatory Visit (INDEPENDENT_AMBULATORY_CARE_PROVIDER_SITE_OTHER): Payer: Medicare HMO | Admitting: Cardiology

## 2016-11-27 ENCOUNTER — Other Ambulatory Visit: Payer: Medicare HMO | Admitting: *Deleted

## 2016-11-27 ENCOUNTER — Encounter: Payer: Self-pay | Admitting: Cardiology

## 2016-11-27 ENCOUNTER — Telehealth: Payer: Self-pay | Admitting: *Deleted

## 2016-11-27 VITALS — BP 124/62 | HR 60 | Ht 62.0 in | Wt 177.0 lb

## 2016-11-27 DIAGNOSIS — I5033 Acute on chronic diastolic (congestive) heart failure: Secondary | ICD-10-CM | POA: Diagnosis not present

## 2016-11-27 DIAGNOSIS — N184 Chronic kidney disease, stage 4 (severe): Secondary | ICD-10-CM | POA: Diagnosis not present

## 2016-11-27 DIAGNOSIS — I2510 Atherosclerotic heart disease of native coronary artery without angina pectoris: Secondary | ICD-10-CM

## 2016-11-27 DIAGNOSIS — Z951 Presence of aortocoronary bypass graft: Secondary | ICD-10-CM | POA: Diagnosis not present

## 2016-11-27 DIAGNOSIS — E782 Mixed hyperlipidemia: Secondary | ICD-10-CM

## 2016-11-27 DIAGNOSIS — I10 Essential (primary) hypertension: Secondary | ICD-10-CM | POA: Diagnosis not present

## 2016-11-27 DIAGNOSIS — N183 Chronic kidney disease, stage 3 unspecified: Secondary | ICD-10-CM

## 2016-11-27 DIAGNOSIS — I5032 Chronic diastolic (congestive) heart failure: Secondary | ICD-10-CM

## 2016-11-27 DIAGNOSIS — E1165 Type 2 diabetes mellitus with hyperglycemia: Secondary | ICD-10-CM | POA: Diagnosis not present

## 2016-11-27 DIAGNOSIS — R309 Painful micturition, unspecified: Secondary | ICD-10-CM | POA: Diagnosis not present

## 2016-11-27 LAB — BASIC METABOLIC PANEL
BUN/Creatinine Ratio: 22 (ref 12–28)
BUN: 42 mg/dL — ABNORMAL HIGH (ref 8–27)
CO2: 23 mmol/L (ref 18–29)
Calcium: 8.8 mg/dL (ref 8.7–10.3)
Chloride: 99 mmol/L (ref 96–106)
Creatinine, Ser: 1.95 mg/dL — ABNORMAL HIGH (ref 0.57–1.00)
GFR calc Af Amer: 27 mL/min/{1.73_m2} — ABNORMAL LOW (ref >59)
GFR calc non Af Amer: 23 mL/min/{1.73_m2} — ABNORMAL LOW (ref >59)
Glucose: 213 mg/dL — ABNORMAL HIGH (ref 65–99)
Potassium: 4 mmol/L (ref 3.5–5.2)
Sodium: 140 mmol/L (ref 134–144)

## 2016-11-27 LAB — PRO B NATRIURETIC PEPTIDE: NT-Pro BNP: 1314 pg/mL — ABNORMAL HIGH (ref 0–738)

## 2016-11-27 MED ORDER — AMLODIPINE BESYLATE 5 MG PO TABS: 5.0000 mg | ORAL_TABLET | Freq: Every day | ORAL | 2 refills | Status: DC

## 2016-11-27 MED ORDER — FUROSEMIDE 40 MG PO TABS: 40.0000 mg | ORAL_TABLET | Freq: Two times a day (BID) | ORAL | 1 refills | Status: DC

## 2016-11-27 NOTE — Progress Notes (Signed)
Cardiology Office Note:    Date:  11/27/2016   ID:  Monica [name], DOB 1931/05/29, MRN [id number]  PCP:  Benito Mccreedy, MD  Cardiologist:  Dr. Ena Dawley   Electrophysiologist:  N/a Nephrologist: Dr. Joesph July [facility])  Referring MD: Benito Mccreedy, MD   Chief complain: LE edema  History of Present Illness:     Monica [name] is a 83 y.o. female with a hx of CAD s/p CABG in 3/17, HTN, HL, diabetes, asthma, GERD, gout, CKD, PUD. Admitted in 3/17 with unstable angina. LHC demonstrated severe 3 vessel CAD and she underwent CABG with LIMA-LAD, SVG-intermediate, SVG-OM, SVG-distal RCA. Postoperative course was complicated by blood loss anemia requiring transfusion with PRBCs.  Since her surgery, she has undergone bilateral thoracenteses for persistent pleural effusions.  Last seen by Dr. Servando Snare 12/13/15.    09/24/2016 - 3 months follow-up, the patient and she has been feeling well she was placed by primary care physician increased her fluid intake as she has been losing weight and was able to gain 16 pounds in the last 3 months. She has noticed worsening lower edema bilaterally. She was previously taking Lasix 40 mg twice a day and was advised by her nephrologist to cut down. Denies any orthopnea or paroxysmal nocturnal dyspnea. No palpitations or chest pain.  11/27/2016 - this is months follow-up, at the last visit her Lasix was increased to twice a day she had weight and BNP was 800. She has lost 3 pounds since then, she feels better, she wears compression stockings and has residual chronic lower extremity edema but no orthopnea or proximal nocturnal dyspnea. She denies any dizziness or syncope. No chest pain. She is able to do all activities of daily living including shopping and cooking.   Past Medical History:  Diagnosis Date  . Asthma   . Bilateral pleural effusion    a. s/p CABG >> s/p bilat thoracentesis 11/2015  . Candida infection 10/2015   Bilateral breasts  . Chronic  diastolic CHF (congestive heart failure) ([place])   . CKD (chronic kidney disease), stage III   . Coronary artery disease involving native coronary artery of native heart without angina pectoris    a. LHC 3/17 >> 3 v CAD >> CABG  . Essential hypertension   . GERD (gastroesophageal reflux disease)   . History of blood transfusion   . History of echocardiogram    a. Echo 3/17: Mild focal basal septal hypertrophy, vigorous LVF, EF 65-70%, normal wall motion, grade 1 diastolic dysfunction, MAC  . Hyperlipidemia   . Mass   . Peptic ulcer   . Protein calorie malnutrition ([place])   . Type 2 diabetes mellitus ([place])     Past Surgical History:  Procedure Laterality Date  . ABDOMINAL HYSTERECTOMY    . CARDIAC CATHETERIZATION N/A 09/17/2015   Procedure: Left Heart Cath and Coronary Angiography;  Surgeon: Jettie Booze, MD;  Location: [facility];  Service: Cardiovascular;  Laterality: N/A;  . COLON SURGERY    . CORONARY ARTERY BYPASS GRAFT N/A 09/19/2015   Procedure: CORONARY ARTERY BYPASS GRAFTING (CABG) TIMES FOUR USING BILATARAL SAPHENOUS VEIN GRAFTS AND LEFT INTERNAL MAMMARY ARTERY;  Surgeon: [name], MD;  Location: [facility];  Service: Open Heart Surgery;  Laterality: N/A;  . TEE WITHOUT CARDIOVERSION N/A 09/19/2015   Procedure: TRANSESOPHAGEAL ECHOCARDIOGRAM (TEE);  Surgeon: [name], MD;  Location: [facility] [facility];  Service: Open Heart Surgery;  Laterality: N/A;    Current Medications: Outpatient Medications Prior to  Visit  Medication Sig Dispense Refill  . albuterol (PROVENTIL HFA;VENTOLIN HFA) 108 (90 Base) MCG/ACT inhaler Inhale 2 puffs into the lungs every 6 (six) hours as needed for wheezing or shortness of breath.    Marland Kitchen aspirin EC 81 MG EC tablet Take 1 tablet (81 mg total) by mouth daily.    Marland Kitchen atorvastatin (LIPITOR) 80 MG tablet Take 80 mg by mouth daily.     . Calcium Carb-Cholecalciferol [phone] MG-UNIT CAPS Take 1 tablet by mouth daily.    . calcium carbonate (TUMS -  DOSED IN MG ELEMENTAL CALCIUM) 500 MG chewable tablet Chew 1 tablet by mouth daily as needed for indigestion or heartburn.    . cephALEXin (KEFLEX) 500 MG capsule Take 1 capsule (500 mg total) by mouth 2 (two) times daily. 14 capsule 0  . Cholecalciferol (VITAMIN D-3) 1000 units CAPS Take 1 capsule by mouth daily.    . diphenhydrAMINE (BENADRYL) 25 MG tablet Take 25 mg by mouth as needed for itching or allergies.     . ferrous sulfate 325 (65 FE) MG tablet Take 325 mg by mouth daily with breakfast.    . furosemide (LASIX) 40 MG tablet Take 1 tablet (40 mg total) by mouth 2 (two) times daily. Take your 1st dose at 8 am, then take your 2nd dose at 2 pm. 46 tablet 0  . insulin glargine (LANTUS) 100 UNIT/ML injection INJECT 15 UNITS INTO THE SKIN IN THE MORNING, 10 UNITS INTO THE SKIN AT NIGHT BEFORE BEDTIME    . metoprolol tartrate (LOPRESSOR) 25 MG tablet Take 1 tablet (25 mg total) by mouth 2 (two) times daily. 180 tablet 3  . nitroGLYCERIN (NITROSTAT) 0.4 MG SL tablet PLACE 1 TABLET BY MOUTH UNDER THE TONGUE EVERY 5 MINUTES AS NEEDED FOR CHEST PAIN. FOR UP TO 3 DOSES. 100 tablet 3  . nystatin (MYCOSTATIN) powder Apply topically 2 (two) times daily. 100,000 units/gm powder applied under bilateral breasts BID x3 weeks    . amLODipine (NORVASC) 10 MG tablet Take 10 mg by mouth daily.     No facility-administered medications prior to visit.       Allergies:   Patient has no known allergies.   Social History   Social History  . Marital status: Widowed    Spouse name: N/A  . Number of children: N/A  . Years of education: N/A   Occupational History  . Retired    Social History Main Topics  . Smoking status: Never Smoker  . Smokeless tobacco: Former Systems developer    Types: Snuff  . Alcohol use No  . Drug use: No  . Sexual activity: Not Asked   Other Topics Concern  . None   Social History Narrative  . None     Family History:  The patient's family history includes Heart attack in her  father.   ROS:   Please see the history of present illness.    Review of Systems  Cardiovascular: Positive for leg swelling.  Neurological: Positive for loss of balance.   All other systems reviewed and are negative.   Physical Exam:    VS:  BP 124/62   Pulse 60   Ht _0  (1.575 m)   Wt 177 lb (80.3 kg)   BMI 32.37 kg/m    Physical Exam  Constitutional: She is oriented to person, place, and time. She appears well-developed and well-nourished.  HENT:  Head: Normocephalic and atraumatic.  Neck: Neck supple. No JVD present.  Cardiovascular: Normal rate, regular  rhythm and normal heart sounds.   No murmur heard. Pulmonary/Chest: Effort normal and breath sounds normal. She has no wheezes. She has no rales.  Abdominal: Soft. There is no tenderness.  Musculoskeletal:  1+ bilateral LE edema - compression stockings in place  Neurological: She is alert and oriented to person, place, and time.  Skin: Skin is warm and dry.  Psychiatric: She has a normal mood and affect.    Wt Readings from Last 3 Encounters:  11/27/16 177 lb (80.3 kg)  09/24/16 180 lb (81.6 kg)  09/20/16 170 lb (77.1 kg)      Studies/Labs Reviewed:     EKG:  EKG is  ordered today.  The ekg ordered today demonstrates NSR, HR 74, normal axis, PRWP, QTc 472 ms, no sig changes.   Recent Labs: 06/26/2016: TSH 3.85 09/20/2016: ALT 26; Hemoglobin 10.6; Platelets 215 10/30/2016: BUN 45; Creatinine, Ser 1.81; NT-Pro BNP 1,597; Potassium 4.0; Sodium 140   Recent Lipid Panel    Component Value Date/Time   CHOL 139 06/26/2016 1004   TRIG 98 06/26/2016 1004   HDL 49 (L) 06/26/2016 1004   CHOLHDL 2.8 06/26/2016 1004   VLDL 20 06/26/2016 1004   LDLCALC 70 06/26/2016 1004    Additional studies/ records that were reviewed today include:   Carotid US 09/18/15 Bilateral ICA 1-39%  LHC 09/17/15 LAD proximal 90%, D2 80% LCx ostial 99% RCA proximal 60%, distal 75%, RPDA 25% Severe three vessel disease. Plan for CVTS  consult for CABG. She needs BP control, beta blocker and statin.  Echo 09/17/15 Mild focal basal septal hypertrophy, vigorous LVF, EF 65-70%, normal wall motion, grade 1 diastolic dysfunction, MAC   ASSESSMENT:     1. Acute on chronic diastolic CHF (congestive heart failure) ([place])     PLAN:     In order of problems listed above:  1. Acute on Chronic Diastolic CHF - improved, we will continue Lasix by mouth twice a day, check BMP and BNP today, decrease amlodipine to 5 mg daily as it can be contributing to her lower extremity edema.  2. CAD - s/p CABG. Asymptomatic. Continue ASA, statin, beta-blocker. Refill sl NTG.  3. HTN - decrease amlodipine to 5 mg po daily.  4. HL - LDL 70, TG 98.  HDL 49. All at goal considering she is diabetic. Continue atorvastatin 40 mg, no side effects.  5. CKD - Managed by Nephrology in [place].  Follow-up in 6 weeks with  BNP and BMP prior to next visit.  Medication Adjustments/Labs and Tests Ordered: Current medicines are reviewed at length with the patient today.  Concerns regarding medicines are outlined above.  Medication changes, Labs and Tests ordered today are outlined in the Patient Instructions noted below. Patient Instructions  Medication Instructions:   DECREASE YOUR AMLODIPINE TO 5 MG ONCE DAILY    Labwork:  TODAY--BMET AND PRO-BNP     Follow-Up:  September WITH DR Jerick Khachatryan--PLEASE SCHEDULE THIS TODAY       If you need a refill on your cardiac medications before your next appointment, please call your pharmacy.    Signed, Ena Dawley, MD  11/27/2016 9:01 AM    [address], [place], [place]  [address] Phone: [phone]; Fax: [phone]

## 2016-11-27 NOTE — Telephone Encounter (Signed)
Notified the pt that per Dr [name], based on her labs, she should continue the same dose of lasix 40 mg po BID.  Confirmed the pharmacy of choice with the pt.  Advised the pt to continue taking this 1 at 0800 and the 2nd dose at 2 pm.  Pt verbalized understanding and agrees with this plan.

## 2016-11-27 NOTE — Patient Instructions (Signed)
Medication Instructions:   DECREASE YOUR AMLODIPINE TO 5 MG ONCE DAILY    Labwork:  TODAY--BMET AND PRO-BNP     Follow-Up:  September WITH DR NELSON--PLEASE SCHEDULE THIS TODAY       If you need a refill on your cardiac medications before your next appointment, please call your pharmacy.

## 2016-11-27 NOTE — Telephone Encounter (Signed)
-----   Message from Dorothy Spark, MD sent at 11/27/2016  4:53 PM EDT ----- Continue the same dose of lasix 40 mg po BID

## 2016-12-18 DIAGNOSIS — E1165 Type 2 diabetes mellitus with hyperglycemia: Secondary | ICD-10-CM | POA: Diagnosis not present

## 2016-12-18 DIAGNOSIS — N184 Chronic kidney disease, stage 4 (severe): Secondary | ICD-10-CM | POA: Diagnosis not present

## 2016-12-18 DIAGNOSIS — Z794 Long term (current) use of insulin: Secondary | ICD-10-CM | POA: Diagnosis not present

## 2016-12-18 DIAGNOSIS — I10 Essential (primary) hypertension: Secondary | ICD-10-CM | POA: Diagnosis not present

## 2016-12-18 DIAGNOSIS — I5032 Chronic diastolic (congestive) heart failure: Secondary | ICD-10-CM | POA: Diagnosis not present

## 2016-12-18 DIAGNOSIS — M7741 Metatarsalgia, right foot: Secondary | ICD-10-CM | POA: Diagnosis not present

## 2016-12-18 DIAGNOSIS — M216X9 Other acquired deformities of unspecified foot: Secondary | ICD-10-CM | POA: Diagnosis not present

## 2016-12-18 DIAGNOSIS — I2510 Atherosclerotic heart disease of native coronary artery without angina pectoris: Secondary | ICD-10-CM | POA: Diagnosis not present

## 2016-12-18 DIAGNOSIS — E1169 Type 2 diabetes mellitus with other specified complication: Secondary | ICD-10-CM | POA: Diagnosis not present

## 2016-12-29 DIAGNOSIS — E1151 Type 2 diabetes mellitus with diabetic peripheral angiopathy without gangrene: Secondary | ICD-10-CM | POA: Diagnosis not present

## 2016-12-29 DIAGNOSIS — I739 Peripheral vascular disease, unspecified: Secondary | ICD-10-CM | POA: Insufficient documentation

## 2016-12-30 ENCOUNTER — Other Ambulatory Visit: Payer: Self-pay | Admitting: Cardiology

## 2016-12-30 DIAGNOSIS — I70203 Unspecified atherosclerosis of native arteries of extremities, bilateral legs: Secondary | ICD-10-CM | POA: Diagnosis not present

## 2017-01-28 DIAGNOSIS — E1151 Type 2 diabetes mellitus with diabetic peripheral angiopathy without gangrene: Secondary | ICD-10-CM | POA: Diagnosis not present

## 2017-01-28 DIAGNOSIS — I739 Peripheral vascular disease, unspecified: Secondary | ICD-10-CM | POA: Diagnosis not present

## 2017-02-05 DIAGNOSIS — H401123 Primary open-angle glaucoma, left eye, severe stage: Secondary | ICD-10-CM | POA: Diagnosis not present

## 2017-02-05 DIAGNOSIS — H5203 Hypermetropia, bilateral: Secondary | ICD-10-CM | POA: Diagnosis not present

## 2017-02-05 DIAGNOSIS — E113393 Type 2 diabetes mellitus with moderate nonproliferative diabetic retinopathy without macular edema, bilateral: Secondary | ICD-10-CM | POA: Diagnosis not present

## 2017-02-05 DIAGNOSIS — Z961 Presence of intraocular lens: Secondary | ICD-10-CM | POA: Diagnosis not present

## 2017-02-05 DIAGNOSIS — H52223 Regular astigmatism, bilateral: Secondary | ICD-10-CM | POA: Diagnosis not present

## 2017-02-05 DIAGNOSIS — H524 Presbyopia: Secondary | ICD-10-CM | POA: Diagnosis not present

## 2017-02-18 DIAGNOSIS — Z1231 Encounter for screening mammogram for malignant neoplasm of breast: Secondary | ICD-10-CM | POA: Diagnosis not present

## 2017-03-13 DIAGNOSIS — R4182 Altered mental status, unspecified: Secondary | ICD-10-CM | POA: Insufficient documentation

## 2017-03-23 DIAGNOSIS — I2510 Atherosclerotic heart disease of native coronary artery without angina pectoris: Secondary | ICD-10-CM | POA: Diagnosis not present

## 2017-03-23 DIAGNOSIS — E1122 Type 2 diabetes mellitus with diabetic chronic kidney disease: Secondary | ICD-10-CM | POA: Diagnosis not present

## 2017-03-23 DIAGNOSIS — E1165 Type 2 diabetes mellitus with hyperglycemia: Secondary | ICD-10-CM | POA: Diagnosis not present

## 2017-03-23 DIAGNOSIS — I5032 Chronic diastolic (congestive) heart failure: Secondary | ICD-10-CM | POA: Diagnosis not present

## 2017-03-23 DIAGNOSIS — Z794 Long term (current) use of insulin: Secondary | ICD-10-CM | POA: Diagnosis not present

## 2017-03-23 DIAGNOSIS — E669 Obesity, unspecified: Secondary | ICD-10-CM | POA: Diagnosis not present

## 2017-03-23 DIAGNOSIS — N184 Chronic kidney disease, stage 4 (severe): Secondary | ICD-10-CM | POA: Diagnosis not present

## 2017-03-23 DIAGNOSIS — I130 Hypertensive heart and chronic kidney disease with heart failure and stage 1 through stage 4 chronic kidney disease, or unspecified chronic kidney disease: Secondary | ICD-10-CM | POA: Diagnosis not present

## 2017-03-23 DIAGNOSIS — E785 Hyperlipidemia, unspecified: Secondary | ICD-10-CM | POA: Diagnosis not present

## 2017-03-25 ENCOUNTER — Ambulatory Visit (INDEPENDENT_AMBULATORY_CARE_PROVIDER_SITE_OTHER): Payer: Medicare HMO | Admitting: Cardiology

## 2017-03-25 ENCOUNTER — Encounter: Payer: Self-pay | Admitting: Cardiology

## 2017-03-25 VITALS — BP 132/74 | HR 66 | Ht 62.0 in | Wt 177.0 lb

## 2017-03-25 DIAGNOSIS — N183 Chronic kidney disease, stage 3 unspecified: Secondary | ICD-10-CM

## 2017-03-25 DIAGNOSIS — E782 Mixed hyperlipidemia: Secondary | ICD-10-CM

## 2017-03-25 DIAGNOSIS — I10 Essential (primary) hypertension: Secondary | ICD-10-CM | POA: Diagnosis not present

## 2017-03-25 DIAGNOSIS — I5033 Acute on chronic diastolic (congestive) heart failure: Secondary | ICD-10-CM

## 2017-03-25 DIAGNOSIS — Z951 Presence of aortocoronary bypass graft: Secondary | ICD-10-CM | POA: Diagnosis not present

## 2017-03-25 LAB — COMPREHENSIVE METABOLIC PANEL
ALT: 17 IU/L (ref 0–32)
AST: 16 IU/L (ref 0–40)
Albumin/Globulin Ratio: 1.2 (ref 1.2–2.2)
Albumin: 3.8 g/dL (ref 3.5–4.7)
Alkaline Phosphatase: 60 IU/L (ref 39–117)
BUN/Creatinine Ratio: 18 (ref 12–28)
BUN: 41 mg/dL — ABNORMAL HIGH (ref 8–27)
Bilirubin Total: 0.2 mg/dL (ref 0.0–1.2)
CO2: 24 mmol/L (ref 20–29)
Calcium: 9.7 mg/dL (ref 8.7–10.3)
Chloride: 99 mmol/L (ref 96–106)
Creatinine, Ser: 2.25 mg/dL — ABNORMAL HIGH (ref 0.57–1.00)
GFR calc Af Amer: 22 mL/min/{1.73_m2} — ABNORMAL LOW (ref >59)
GFR calc non Af Amer: 19 mL/min/{1.73_m2} — ABNORMAL LOW (ref >59)
Globulin, Total: 3.2 g/dL (ref 1.5–4.5)
Glucose: 161 mg/dL — ABNORMAL HIGH (ref 65–99)
Potassium: 3.6 mmol/L (ref 3.5–5.2)
Sodium: 140 mmol/L (ref 134–144)
Total Protein: 7 g/dL (ref 6.0–8.5)

## 2017-03-25 LAB — CBC WITH DIFFERENTIAL/PLATELET
Basophils Absolute: 0 10*3/uL (ref 0.0–0.2)
Basos: 1 %
EOS (ABSOLUTE): 0.2 10*3/uL (ref 0.0–0.4)
Eos: 3 %
Hematocrit: 33.2 % — ABNORMAL LOW (ref 34.0–46.6)
Hemoglobin: 10.9 g/dL — ABNORMAL LOW (ref 11.1–15.9)
Immature Grans (Abs): 0 10*3/uL (ref 0.0–0.1)
Immature Granulocytes: 0 %
Lymphocytes Absolute: 3.7 10*3/uL — ABNORMAL HIGH (ref 0.7–3.1)
Lymphs: 44 %
MCH: 28.6 pg (ref 26.6–33.0)
MCHC: 32.8 g/dL (ref 31.5–35.7)
MCV: 87 fL (ref 79–97)
Monocytes Absolute: 0.7 10*3/uL (ref 0.1–0.9)
Monocytes: 8 %
Neutrophils Absolute: 3.6 10*3/uL (ref 1.4–7.0)
Neutrophils: 44 %
Platelets: 237 10*3/uL (ref 150–379)
RBC: 3.81 x10E6/uL (ref 3.77–5.28)
RDW: 14.9 % (ref 12.3–15.4)
WBC: 8.2 10*3/uL (ref 3.4–10.8)

## 2017-03-25 LAB — PRO B NATRIURETIC PEPTIDE: NT-Pro BNP: 1224 pg/mL — ABNORMAL HIGH (ref 0–738)

## 2017-03-25 NOTE — Progress Notes (Signed)
Cardiology Office Note:    Date:  03/25/2017   ID:  Monica [name], DOB 03-29-1931, MRN [id number]  PCP:  Benito Mccreedy, MD  Cardiologist:  Dr. Ena Dawley   Electrophysiologist:  N/a Nephrologist: Dr. Joesph July [facility])  Referring MD: Benito Mccreedy, MD   Chief complain: LE edema  History of Present Illness:     Monica [name] is a 83 y.o. female with a hx of CAD s/p CABG in 3/17, HTN, HL, diabetes, asthma, GERD, gout, CKD, PUD. Admitted in 3/17 with unstable angina. LHC demonstrated severe 3 vessel CAD and she underwent CABG with LIMA-LAD, SVG-intermediate, SVG-OM, SVG-distal RCA. Postoperative course was complicated by blood loss anemia requiring transfusion with PRBCs.  Since her surgery, she has undergone bilateral thoracenteses for persistent pleural effusions.  Last seen by Dr. Servando Snare 12/13/15.    09/24/2016 - 3 months follow-up, the patient and she has been feeling well she was placed by primary care physician increased her fluid intake as she has been losing weight and was able to gain 16 pounds in the last 3 months. She has noticed worsening lower edema bilaterally. She was previously taking Lasix 40 mg twice a day and was advised by her nephrologist to cut down. Denies any orthopnea or paroxysmal nocturnal dyspnea. No palpitations or chest pain.  11/27/2016 - this is months follow-up, at the last visit her Lasix was increased to twice a day she had weight and BNP was 800. She has lost 3 pounds since then, she feels better, she wears compression stockings and has residual chronic lower extremity edema but no orthopnea or proximal nocturnal dyspnea. She denies any dizziness or syncope. No chest pain. She is able to do all activities of daily living including shopping and cooking.  03/26/2017 - the patient is coming after 4 months, she feels great, she is wearing compression stockings that keep her lower extremity edema mild. She continues to take Lasix 40 mg by mouth twice a day  and has no side effects. She also has no side effects with any other medications. She denies any orthopnea, paroxysmal nocturnal dyspnea and no chest pain, dizziness, or syncope no falls no memory problems or muscle pain with atorvastatin.   Past Medical History:  Diagnosis Date  . Asthma   . Bilateral pleural effusion    a. s/p CABG >> s/p bilat thoracentesis 11/2015  . Candida infection 10/2015   Bilateral breasts  . Chronic diastolic CHF (congestive heart failure) ([place])   . CKD (chronic kidney disease), stage III   . Coronary artery disease involving native coronary artery of native heart without angina pectoris    a. LHC 3/17 >> 3 v CAD >> CABG  . Essential hypertension   . GERD (gastroesophageal reflux disease)   . History of blood transfusion   . History of echocardiogram    a. Echo 3/17: Mild focal basal septal hypertrophy, vigorous LVF, EF 65-70%, normal wall motion, grade 1 diastolic dysfunction, MAC  . Hyperlipidemia   . Mass   . Peptic ulcer   . Protein calorie malnutrition ([place])   . Type 2 diabetes mellitus ([place])     Past Surgical History:  Procedure Laterality Date  . ABDOMINAL HYSTERECTOMY    . CARDIAC CATHETERIZATION N/A 09/17/2015   Procedure: Left Heart Cath and Coronary Angiography;  Surgeon: Jettie Booze, MD;  Location: [place] [facility];  Service: Cardiovascular;  Laterality: N/A;  . COLON SURGERY    . CORONARY ARTERY BYPASS GRAFT N/A 09/19/2015  Procedure: CORONARY ARTERY BYPASS GRAFTING (CABG) TIMES FOUR USING BILATARAL SAPHENOUS VEIN GRAFTS AND LEFT INTERNAL MAMMARY ARTERY;  Surgeon: [name], MD;  Location: [place];  Service: Open Heart Surgery;  Laterality: N/A;  . TEE WITHOUT CARDIOVERSION N/A 09/19/2015   Procedure: TRANSESOPHAGEAL ECHOCARDIOGRAM (TEE);  Surgeon: [name], MD;  Location: [place];  Service: Open Heart Surgery;  Laterality: N/A;    Current Medications: Outpatient Medications Prior to Visit  Medication Sig Dispense  Refill  . albuterol (PROVENTIL HFA;VENTOLIN HFA) 108 (90 Base) MCG/ACT inhaler Inhale 2 puffs into the lungs every 6 (six) hours as needed for wheezing or shortness of breath.    Marland Kitchen amLODipine (NORVASC) 5 MG tablet Take 1 tablet (5 mg total) by mouth daily. 90 tablet 2  . aspirin EC 81 MG EC tablet Take 1 tablet (81 mg total) by mouth daily.    Marland Kitchen atorvastatin (LIPITOR) 80 MG tablet Take 80 mg by mouth daily.     . Calcium Carb-Cholecalciferol [phone] MG-UNIT CAPS Take 1 tablet by mouth daily.    . calcium carbonate (TUMS - DOSED IN MG ELEMENTAL CALCIUM) 500 MG chewable tablet Chew 1 tablet by mouth daily as needed for indigestion or heartburn.    . cephALEXin (KEFLEX) 500 MG capsule Take 1 capsule (500 mg total) by mouth 2 (two) times daily. 14 capsule 0  . Cholecalciferol (VITAMIN D-3) 1000 units CAPS Take 1 capsule by mouth daily.    . diphenhydrAMINE (BENADRYL) 25 MG tablet Take 25 mg by mouth as needed for itching or allergies.     . ferrous sulfate 325 (65 FE) MG tablet Take 325 mg by mouth daily with breakfast.    . furosemide (LASIX) 40 MG tablet Take 1 tablet (40 mg total) by mouth 2 (two) times daily. Take your 1st dose at 8 am, then take your 2nd dose at 2 pm. 180 tablet 1  . insulin glargine (LANTUS) 100 UNIT/ML injection INJECT 15 UNITS INTO THE SKIN IN THE MORNING, 10 UNITS INTO THE SKIN AT NIGHT BEFORE BEDTIME    . metoprolol tartrate (LOPRESSOR) 25 MG tablet Take 1 tablet (25 mg total) by mouth 2 (two) times daily. 180 tablet 3  . nitroGLYCERIN (NITROSTAT) 0.4 MG SL tablet PLACE 1 TABLET BY MOUTH UNDER THE TONGUE EVERY 5 MINUTES AS NEEDED FOR CHEST PAIN. FOR UP TO 3 DOSES. 100 tablet 3  . nystatin (MYCOSTATIN) powder Apply topically 2 (two) times daily. 100,000 units/gm powder applied under bilateral breasts BID x3 weeks     No facility-administered medications prior to visit.       Allergies:   Patient has no known allergies.   Social History   Social History  . Marital  status: Widowed    Spouse name: N/A  . Number of children: N/A  . Years of education: N/A   Occupational History  . Retired    Social History Main Topics  . Smoking status: Never Smoker  . Smokeless tobacco: Former Systems developer    Types: Snuff  . Alcohol use No  . Drug use: No  . Sexual activity: Not Asked   Other Topics Concern  . None   Social History Narrative  . None     Family History:  The patient's family history includes Heart attack in her father.   ROS:   Please see the history of present illness.    Review of Systems  Cardiovascular: Positive for leg swelling.   All other systems reviewed and are negative.  Physical Exam:    VS:  BP 132/74   Pulse 66   Ht _0  (1.575 m)   Wt 177 lb (80.3 kg)   SpO2 97%   BMI 32.37 kg/m    Physical Exam  Constitutional: She is oriented to person, place, and time. She appears well-developed and well-nourished.  HENT:  Head: Normocephalic and atraumatic.  Neck: Neck supple. No JVD present.  Cardiovascular: Normal rate, regular rhythm and normal heart sounds.   No murmur heard. Pulmonary/Chest: Effort normal and breath sounds normal. She has no wheezes. She has no rales.  Abdominal: Soft. There is no tenderness.  Musculoskeletal:  1+ bilateral LE edema - compression stockings in place  Neurological: She is alert and oriented to person, place, and time.  Skin: Skin is warm and dry.  Psychiatric: She has a normal mood and affect.    Wt Readings from Last 3 Encounters:  03/25/17 177 lb (80.3 kg)  11/27/16 177 lb (80.3 kg)  09/24/16 180 lb (81.6 kg)      Studies/Labs Reviewed:     EKG:  EKG is  ordered today.  The ekg ordered today demonstrates NSR, HR 74, normal axis, PRWP, QTc 472 ms, no sig changes.   Recent Labs: 06/26/2016: TSH 3.85 09/20/2016: ALT 26; Hemoglobin 10.6; Platelets 215 11/27/2016: BUN 42; Creatinine, Ser 1.95; NT-Pro BNP 1,314; Potassium 4.0; Sodium 140   Recent Lipid Panel    Component Value  Date/Time   CHOL 139 06/26/2016 1004   TRIG 98 06/26/2016 1004   HDL 49 (L) 06/26/2016 1004   CHOLHDL 2.8 06/26/2016 1004   VLDL 20 06/26/2016 1004   LDLCALC 70 06/26/2016 1004    Additional studies/ records that were reviewed today include:   Carotid US 09/18/15 Bilateral ICA 1-39%  LHC 09/17/15 LAD proximal 90%, D2 80% LCx ostial 99% RCA proximal 60%, distal 75%, RPDA 25% Severe three vessel disease. Plan for CVTS consult for CABG. She needs BP control, beta blocker and statin.  Echo 09/17/15 Mild focal basal septal hypertrophy, vigorous LVF, EF 65-70%, normal wall motion, grade 1 diastolic dysfunction, MAC   ASSESSMENT:     1. Acute on chronic diastolic CHF (congestive heart failure) ([place])   2. Essential hypertension   3. S/P CABG x 3   4. CKD (chronic kidney disease), stage III   5. Mixed hyperlipidemia    PLAN:     In order of problems listed above:  1. Acute on Chronic Diastolic CHF -stable, she has mild chronic persistent lower extremity edema control to compression stocking but otherwise is asymptomatic, we'll continue Lasix 40 mg twice a day and recheck BMP and BNP today.   2. CAD - s/p CABG. Asymptomatic. Continue ASA, statin, beta-blocker. No ischemic workup needed.  3. HTN - controlled after decreasing amlodipine to 5 mg daily  4. HL - LDL 70, TG 98.  HDL 49. All at goal considering she is diabetic. Continue atorvastatin 40 mg, no side effects. Recheck LFTs today.  5. CKD - Managed by Nephrology in [place].Recheck BMP and BNP today.  Follow-up in 6 weeks with  BNP and BMP prior to next visit.  Medication Adjustments/Labs and Tests Ordered: Current medicines are reviewed at length with the patient today.  Concerns regarding medicines are outlined above.  Medication changes, Labs and Tests ordered today are outlined in the Patient Instructions noted below. Patient Instructions  Medication Instructions:   Your physician recommends that you continue on your  current medications as directed. Please refer  to the Current Medication list given to you today.    Labwork:  TODAY--CMET, CBC W DIFF, AND PRO-BNP     Follow-Up:  4 MONTHS WITH DR [name]       If you need a refill on your cardiac medications before your next appointment, please call your pharmacy.    Signed, Ena Dawley, MD  03/25/2017 9:24 AM    [address], [place], [place]  [address] Phone: [phone]; Fax: [phone]

## 2017-03-25 NOTE — Patient Instructions (Signed)
Medication Instructions:   Your physician recommends that you continue on your current medications as directed. Please refer to the Current Medication list given to you today.    Labwork:  TODAY--CMET, CBC W DIFF, AND PRO-BNP     Follow-Up:  4 MONTHS WITH DR [name]       If you need a refill on your cardiac medications before your next appointment, please call your pharmacy.

## 2017-03-30 ENCOUNTER — Telehealth: Payer: Self-pay | Admitting: *Deleted

## 2017-03-30 MED ORDER — FUROSEMIDE 20 MG PO TABS: 20.0000 mg | ORAL_TABLET | Freq: Two times a day (BID) | ORAL | 1 refills | Status: DC

## 2017-03-30 NOTE — Telephone Encounter (Signed)
Notified the pt that per Dr [name], her lab showed CHF has improved, but her kidney function is slightly worse and she recommends that we decrease her lasix to 20 mg po bid.  Confirmed the pharmacy of choice with the pt.  Pt verbalized understanding and agrees with this plan.

## 2017-03-30 NOTE — Telephone Encounter (Signed)
-----   Message from Dorothy Spark, MD sent at 03/27/2017  4:57 PM EDT ----- CHF has improved, but her kidney function is slightly worse, I would decrease lasix to 20 mg po BID.

## 2017-04-02 DIAGNOSIS — M109 Gout, unspecified: Secondary | ICD-10-CM | POA: Diagnosis not present

## 2017-04-02 DIAGNOSIS — E7800 Pure hypercholesterolemia, unspecified: Secondary | ICD-10-CM | POA: Diagnosis not present

## 2017-04-02 DIAGNOSIS — D649 Anemia, unspecified: Secondary | ICD-10-CM | POA: Diagnosis not present

## 2017-04-02 DIAGNOSIS — N189 Chronic kidney disease, unspecified: Secondary | ICD-10-CM | POA: Diagnosis not present

## 2017-04-02 DIAGNOSIS — I10 Essential (primary) hypertension: Secondary | ICD-10-CM | POA: Diagnosis not present

## 2017-04-02 DIAGNOSIS — R309 Painful micturition, unspecified: Secondary | ICD-10-CM | POA: Diagnosis not present

## 2017-04-02 DIAGNOSIS — E1165 Type 2 diabetes mellitus with hyperglycemia: Secondary | ICD-10-CM | POA: Diagnosis not present

## 2017-04-02 DIAGNOSIS — N184 Chronic kidney disease, stage 4 (severe): Secondary | ICD-10-CM | POA: Diagnosis not present

## 2017-04-02 DIAGNOSIS — Z23 Encounter for immunization: Secondary | ICD-10-CM | POA: Diagnosis not present

## 2017-04-02 DIAGNOSIS — E039 Hypothyroidism, unspecified: Secondary | ICD-10-CM | POA: Diagnosis not present

## 2017-04-29 DIAGNOSIS — M170 Bilateral primary osteoarthritis of knee: Secondary | ICD-10-CM | POA: Diagnosis not present

## 2017-06-12 DIAGNOSIS — Z6835 Body mass index (BMI) 35.0-35.9, adult: Secondary | ICD-10-CM | POA: Diagnosis not present

## 2017-06-12 DIAGNOSIS — E1122 Type 2 diabetes mellitus with diabetic chronic kidney disease: Secondary | ICD-10-CM | POA: Diagnosis not present

## 2017-06-12 DIAGNOSIS — R600 Localized edema: Secondary | ICD-10-CM | POA: Diagnosis not present

## 2017-06-12 DIAGNOSIS — Z951 Presence of aortocoronary bypass graft: Secondary | ICD-10-CM | POA: Diagnosis not present

## 2017-06-12 DIAGNOSIS — N184 Chronic kidney disease, stage 4 (severe): Secondary | ICD-10-CM | POA: Diagnosis not present

## 2017-06-12 DIAGNOSIS — Z794 Long term (current) use of insulin: Secondary | ICD-10-CM | POA: Diagnosis not present

## 2017-06-12 DIAGNOSIS — E559 Vitamin D deficiency, unspecified: Secondary | ICD-10-CM | POA: Diagnosis not present

## 2017-06-12 DIAGNOSIS — E1165 Type 2 diabetes mellitus with hyperglycemia: Secondary | ICD-10-CM | POA: Diagnosis not present

## 2017-06-12 DIAGNOSIS — E669 Obesity, unspecified: Secondary | ICD-10-CM | POA: Diagnosis not present

## 2017-06-23 ENCOUNTER — Other Ambulatory Visit: Payer: Self-pay | Admitting: Cardiology

## 2017-06-23 DIAGNOSIS — I5033 Acute on chronic diastolic (congestive) heart failure: Secondary | ICD-10-CM

## 2017-06-23 DIAGNOSIS — N183 Chronic kidney disease, stage 3 unspecified: Secondary | ICD-10-CM

## 2017-06-23 DIAGNOSIS — I10 Essential (primary) hypertension: Secondary | ICD-10-CM

## 2017-06-23 DIAGNOSIS — I5032 Chronic diastolic (congestive) heart failure: Secondary | ICD-10-CM

## 2017-06-23 DIAGNOSIS — I2510 Atherosclerotic heart disease of native coronary artery without angina pectoris: Secondary | ICD-10-CM

## 2017-06-23 DIAGNOSIS — Z951 Presence of aortocoronary bypass graft: Secondary | ICD-10-CM

## 2017-07-10 ENCOUNTER — Encounter: Payer: Self-pay | Admitting: Cardiology

## 2017-07-15 ENCOUNTER — Telehealth: Payer: Self-pay

## 2017-07-15 NOTE — Telephone Encounter (Signed)
Copied from [place] [id number]. Topic: Appointment Scheduling - Scheduling Inquiry for Clinic >> Jul 15, 2017 12:53 PM Valla Leaver wrote: Reason for CRM: Patient's daughter Gregary Signs is calling to find out if Dr. Carollee Herter will accept her mother as a new patient? She has high bp, diabetic, and heart issues which she is seen at [facility] by Dr[facility] [name] within [facility]. Please call back to advise.

## 2017-07-15 NOTE — Telephone Encounter (Signed)
Please advise 

## 2017-07-16 NOTE — Telephone Encounter (Signed)
I am not taking new patients at this time--- Dr Lorelei Pont and wendling are

## 2017-07-17 NOTE — Telephone Encounter (Signed)
Called pt and LVM informing her of Dr Nonda Lou decision and advised to call back and establish with another provider in our practice.

## 2017-07-27 DIAGNOSIS — H401111 Primary open-angle glaucoma, right eye, mild stage: Secondary | ICD-10-CM | POA: Diagnosis not present

## 2017-07-27 DIAGNOSIS — H401123 Primary open-angle glaucoma, left eye, severe stage: Secondary | ICD-10-CM | POA: Diagnosis not present

## 2017-07-29 NOTE — Telephone Encounter (Signed)
Monica [name]- can you try calling Pt again please?

## 2017-07-29 NOTE — Telephone Encounter (Signed)
Called pt ( number on file is incorrect and for her daughter in law. She gave me Gregary Signs [place]'s number [phone]). Informed Gregary Signs of Dr. Nonda Lou decision and advised her to establish care with another provider in our practice. Gregary Signs stated that she would call her mother and discuss the options and call back to establish with another provider.

## 2017-07-31 DIAGNOSIS — E559 Vitamin D deficiency, unspecified: Secondary | ICD-10-CM | POA: Diagnosis not present

## 2017-07-31 DIAGNOSIS — K254 Chronic or unspecified gastric ulcer with hemorrhage: Secondary | ICD-10-CM | POA: Diagnosis not present

## 2017-07-31 DIAGNOSIS — I119 Hypertensive heart disease without heart failure: Secondary | ICD-10-CM | POA: Diagnosis not present

## 2017-07-31 DIAGNOSIS — Z789 Other specified health status: Secondary | ICD-10-CM | POA: Diagnosis not present

## 2017-07-31 DIAGNOSIS — R1013 Epigastric pain: Secondary | ICD-10-CM | POA: Diagnosis not present

## 2017-07-31 DIAGNOSIS — E1151 Type 2 diabetes mellitus with diabetic peripheral angiopathy without gangrene: Secondary | ICD-10-CM | POA: Diagnosis not present

## 2017-07-31 DIAGNOSIS — Z0000 Encounter for general adult medical examination without abnormal findings: Secondary | ICD-10-CM | POA: Diagnosis not present

## 2017-07-31 DIAGNOSIS — J45909 Unspecified asthma, uncomplicated: Secondary | ICD-10-CM | POA: Diagnosis not present

## 2017-07-31 DIAGNOSIS — N183 Chronic kidney disease, stage 3 (moderate): Secondary | ICD-10-CM | POA: Diagnosis not present

## 2017-07-31 DIAGNOSIS — E785 Hyperlipidemia, unspecified: Secondary | ICD-10-CM | POA: Diagnosis not present

## 2017-07-31 DIAGNOSIS — I5032 Chronic diastolic (congestive) heart failure: Secondary | ICD-10-CM | POA: Diagnosis not present

## 2017-08-03 NOTE — Telephone Encounter (Signed)
Daughter called to advise pt wants to stay with her current pcp, so no appt will be neccessary at this time.

## 2017-08-07 DIAGNOSIS — H524 Presbyopia: Secondary | ICD-10-CM | POA: Diagnosis not present

## 2017-08-07 DIAGNOSIS — H52209 Unspecified astigmatism, unspecified eye: Secondary | ICD-10-CM | POA: Diagnosis not present

## 2017-08-07 DIAGNOSIS — E1151 Type 2 diabetes mellitus with diabetic peripheral angiopathy without gangrene: Secondary | ICD-10-CM | POA: Diagnosis not present

## 2017-08-07 DIAGNOSIS — H5203 Hypermetropia, bilateral: Secondary | ICD-10-CM | POA: Diagnosis not present

## 2017-08-07 DIAGNOSIS — B351 Tinea unguium: Secondary | ICD-10-CM | POA: Diagnosis not present

## 2017-08-14 DIAGNOSIS — N183 Chronic kidney disease, stage 3 (moderate): Secondary | ICD-10-CM | POA: Diagnosis not present

## 2017-08-14 DIAGNOSIS — K254 Chronic or unspecified gastric ulcer with hemorrhage: Secondary | ICD-10-CM | POA: Diagnosis not present

## 2017-08-14 DIAGNOSIS — J45909 Unspecified asthma, uncomplicated: Secondary | ICD-10-CM | POA: Diagnosis not present

## 2017-08-14 DIAGNOSIS — R1013 Epigastric pain: Secondary | ICD-10-CM | POA: Diagnosis not present

## 2017-08-14 DIAGNOSIS — I119 Hypertensive heart disease without heart failure: Secondary | ICD-10-CM | POA: Diagnosis not present

## 2017-08-14 DIAGNOSIS — E1151 Type 2 diabetes mellitus with diabetic peripheral angiopathy without gangrene: Secondary | ICD-10-CM | POA: Diagnosis not present

## 2017-08-14 DIAGNOSIS — E785 Hyperlipidemia, unspecified: Secondary | ICD-10-CM | POA: Diagnosis not present

## 2017-08-14 DIAGNOSIS — I5032 Chronic diastolic (congestive) heart failure: Secondary | ICD-10-CM | POA: Diagnosis not present

## 2017-08-14 DIAGNOSIS — E559 Vitamin D deficiency, unspecified: Secondary | ICD-10-CM | POA: Diagnosis not present

## 2017-08-25 ENCOUNTER — Ambulatory Visit: Payer: Medicare HMO | Admitting: Physician Assistant

## 2017-08-25 ENCOUNTER — Encounter: Payer: Self-pay | Admitting: Physician Assistant

## 2017-08-25 VITALS — BP 160/70 | HR 70 | Ht 62.0 in | Wt 180.1 lb

## 2017-08-25 DIAGNOSIS — E782 Mixed hyperlipidemia: Secondary | ICD-10-CM | POA: Diagnosis not present

## 2017-08-25 DIAGNOSIS — N183 Chronic kidney disease, stage 3 unspecified: Secondary | ICD-10-CM

## 2017-08-25 DIAGNOSIS — E1122 Type 2 diabetes mellitus with diabetic chronic kidney disease: Secondary | ICD-10-CM

## 2017-08-25 DIAGNOSIS — I2510 Atherosclerotic heart disease of native coronary artery without angina pectoris: Secondary | ICD-10-CM

## 2017-08-25 DIAGNOSIS — Z794 Long term (current) use of insulin: Secondary | ICD-10-CM | POA: Diagnosis not present

## 2017-08-25 DIAGNOSIS — I5032 Chronic diastolic (congestive) heart failure: Secondary | ICD-10-CM

## 2017-08-25 DIAGNOSIS — I10 Essential (primary) hypertension: Secondary | ICD-10-CM

## 2017-08-25 NOTE — Progress Notes (Signed)
Cardiology Office Note:    Date:  08/25/2017   ID:  Monica [name], DOB [birth year], MRN [id number]  PCP:  Benito Mccreedy, MD  Cardiologist:  Ena Dawley, MD  Nephrologist: Dr. Joesph July [facility])   Referring MD: Benito Mccreedy, MD   Chief Complaint  Patient presents with  . Follow-up    CAD, heart failure    History of Present Illness:    Monica [name] is a 84 y.o. female with coronary artery disease status post coronary artery bypass grafting in March 2017, hypertension, diabetes, asthma, acid reflux, diastolic heart failure, chronic kidney disease, peptic ulcer disease.  Last seen by Dr. Meda [name] 03/25/17.  Ms. Monica [name] returns for follow-up.  Overall, she is doing well.  She denies significant shortness of breath.  She denies chest discomfort.  She denies orthopnea or PND.  She denies syncope.  She has chronic lower extremity edema.  This is fairly stable.  She wears compression stockings.  Prior CV studies:   The following studies were reviewed today:  Carotid US 09/18/15 Bilateral ICA 1-39%  LHC 09/17/15 >> CABG LAD proximal 90%, D2 80% LCx ostial 99% RCA proximal 60%, distal 75%, RPDA 25%  Echo 09/17/15 Mild focal basal septal hypertrophy, vigorous LVF, EF 65-70%, normal wall motion, grade 1 diastolic dysfunction, MAC  Past Medical History:  Diagnosis Date  . Asthma   . Bilateral pleural effusion    a. s/p CABG >> s/p bilat thoracentesis 11/2015  . Candida infection 10/2015   Bilateral breasts  . Chronic diastolic CHF (congestive heart failure) ([place])   . CKD (chronic kidney disease), stage III ([place])   . Coronary artery disease involving native coronary artery of native heart without angina pectoris    a. LHC 3/17 >> 3 v CAD >> CABG  . Essential hypertension   . GERD (gastroesophageal reflux disease)   . History of blood transfusion   . History of echocardiogram    a. Echo 3/17: Mild focal basal septal hypertrophy, vigorous LVF, EF 65-70%, normal wall motion,  grade 1 diastolic dysfunction, MAC  . Hyperlipidemia   . Mass   . Peptic ulcer   . Protein calorie malnutrition ([place])   . Type 2 diabetes mellitus ([place])     Past Surgical History:  Procedure Laterality Date  . ABDOMINAL HYSTERECTOMY    . CARDIAC CATHETERIZATION N/A 09/17/2015   Procedure: Left Heart Cath and Coronary Angiography;  Surgeon: Jettie Booze, MD;  Location: [place] [facility];  Service: Cardiovascular;  Laterality: N/A;  . COLON SURGERY    . CORONARY ARTERY BYPASS GRAFT N/A 09/19/2015   Procedure: CORONARY ARTERY BYPASS GRAFTING (CABG) TIMES FOUR USING BILATARAL SAPHENOUS VEIN GRAFTS AND LEFT INTERNAL MAMMARY ARTERY;  Surgeon: [name], MD;  Location: [place];  Service: Open Heart Surgery;  Laterality: N/A;  . TEE WITHOUT CARDIOVERSION N/A 09/19/2015   Procedure: TRANSESOPHAGEAL ECHOCARDIOGRAM (TEE);  Surgeon: [name], MD;  Location: [place];  Service: Open Heart Surgery;  Laterality: N/A;    Current Medications: Current Meds  Medication Sig  . albuterol (PROVENTIL HFA;VENTOLIN HFA) 108 (90 Base) MCG/ACT inhaler Inhale 2 puffs into the lungs every 6 (six) hours as needed for wheezing or shortness of breath.  Marland Kitchen amLODipine (NORVASC) 5 MG tablet Take 1 tablet (5 mg total) by mouth daily.  Marland Kitchen aspirin EC 81 MG EC tablet Take 1 tablet (81 mg total) by mouth daily.  Marland Kitchen atorvastatin (LIPITOR) 80 MG tablet Take 80 mg by mouth daily.   Marland Kitchen  Calcium Carb-Cholecalciferol [phone] MG-UNIT CAPS Take 1 tablet by mouth daily.  . calcium carbonate (TUMS - DOSED IN MG ELEMENTAL CALCIUM) 500 MG chewable tablet Chew 1 tablet by mouth daily as needed for indigestion or heartburn.  . cephALEXin (KEFLEX) 500 MG capsule Take 1 capsule (500 mg total) by mouth 2 (two) times daily.  . Cholecalciferol (VITAMIN D-3) 1000 units CAPS Take 1 capsule by mouth daily.  . diphenhydrAMINE (BENADRYL) 25 MG tablet Take 25 mg by mouth as needed for itching or allergies.   . ferrous sulfate 325 (65  FE) MG tablet Take 325 mg by mouth daily with breakfast.  . insulin glargine (LANTUS) 100 UNIT/ML injection INJECT 15 UNITS INTO THE SKIN IN THE MORNING, 10 UNITS INTO THE SKIN AT NIGHT BEFORE BEDTIME  . metoprolol tartrate (LOPRESSOR) 25 MG tablet Take 1 tablet (25 mg total) by mouth 2 (two) times daily.  . nitroGLYCERIN (NITROSTAT) 0.4 MG SL tablet PLACE 1 TABLET BY MOUTH UNDER THE TONGUE EVERY 5 MINUTES AS NEEDED FOR CHEST PAIN. FOR UP TO 3 DOSES.  Marland Kitchen nystatin (MYCOSTATIN) powder Apply topically 2 (two) times daily. 100,000 units/gm powder applied under bilateral breasts BID x3 weeks     Allergies:   Patient has no known allergies.   Social History   Tobacco Use  . Smoking status: Never Smoker  . Smokeless tobacco: Former Systems developer    Types: Snuff  Substance Use Topics  . Alcohol use: No    Alcohol/week: 0.0 oz  . Drug use: No     Family Hx: The patient's family history includes Heart attack in her father.  ROS:   Please see the history of present illness.    ROS All other systems reviewed and are negative.   EKGs/Labs/Other Test Reviewed:    EKG:  EKG is  ordered today.  The ekg ordered today demonstrates sinus rhythm, heart rate 69, left axis deviation, nonspecific ST-T wave changes, QTC 471 ms, no change from prior tracings  Recent Labs: 03/25/2017: ALT 17; BUN 41; Creatinine, Ser 2.25; Hemoglobin 10.9; NT-Pro BNP 1,224; Platelets 237; Potassium 3.6; Sodium 140   Recent Lipid Panel Lab Results  Component Value Date/Time   CHOL 139 06/26/2016 10:04 AM   TRIG 98 06/26/2016 10:04 AM   HDL 49 (L) 06/26/2016 10:04 AM   CHOLHDL 2.8 06/26/2016 10:04 AM   LDLCALC 70 06/26/2016 10:04 AM    Physical Exam:    VS:  BP (!) 160/70   Pulse 70   Ht _0  (1.575 m)   Wt 180 lb 1.9 oz (81.7 kg)   SpO2 99%   BMI 32.94 kg/m     Wt Readings from Last 3 Encounters:  08/25/17 180 lb 1.9 oz (81.7 kg)  03/25/17 177 lb (80.3 kg)  11/27/16 177 lb (80.3 kg)     Physical Exam    Constitutional: She is oriented to person, place, and time. She appears well-developed and well-nourished. No distress.  HENT:  Head: Normocephalic and atraumatic.  Neck: No JVD present.  Cardiovascular: Normal rate and regular rhythm.  No murmur heard. Pulmonary/Chest: Effort normal. She has no rales.  Abdominal: Soft.  Musculoskeletal: She exhibits edema (trace-1+ bilat LE edema (compression stockings in place)).  Neurological: She is alert and oriented to person, place, and time.  Skin: Skin is warm and dry.    ASSESSMENT & PLAN:    1.  Chronic diastolic CHF (congestive heart failure) (HCC) NYHA 2.  Volume is stable.  Continue current medical regimen.  2.  Coronary artery disease Status post CABG.  She is doing well without anginal symptoms.  Continue aspirin, statin, beta-blocker.  3.  Essential hypertension Blood pressure above target today.  It had been well controlled previously.  I have asked her to continue to monitor this over the next couple of weeks.  If her blood pressure remains above goal, she knows to contact us for further instruction.  4.  Mixed hyperlipidemia  Continue statin.  Follow up CMET, Lipids prior to next office visit.   5.  Type 2 diabetes mellitus with stage 3 chronic kidney disease Follow up with primary care as directed for management.   6.  CKD (chronic kidney disease), stage III ([place]) Continue follow up with Nephrology.  Arrange follow up CMET prior to next visit.    Dispo:  Return in about 4 months (around 12/23/2017) for Routine Follow Up w/ Dr. Meda [name], or Richardson Dopp, PA-C.   Medication Adjustments/Labs and Tests Ordered: Current medicines are reviewed at length with the patient today.  Concerns regarding medicines are outlined above.  Tests Ordered: Orders Placed This Encounter  Procedures  . Comp Met (CMET)  . Lipid Profile  . EKG 12-Lead   Medication Changes: No orders of the defined types were placed in this  encounter.   Signed, Richardson Dopp, PA-C  08/25/2017 12:52 PM    [facility] [place], [place], [place]  [address] Phone: [phone]; Fax: [phone]

## 2017-08-25 NOTE — Patient Instructions (Addendum)
Medication Instructions:  No changes  Labwork: Schedule fasting lab work in the next 4 months (before your next follow up appointment) - CMET, Lipids  Testing/Procedures: None   Follow-Up: Ena Dawley, MD or Richardson Dopp, PA-C in 4 months. WE WILL SEND OUT A REMINDER LETTER TO HAVE YOU CALL AND MAKE AN APPT ; PLEASE BE SURE TO LET THE OPERATOR KNOW THAT YOU WILL NEED TO HAVE FASTING LAB WORK ABOUT 1-2 WEEKS BEFORE YOUR APPT WITH THE DOCTOR.  Any Other Special Instructions Will Be Listed Below (If Applicable). Check your blood pressure once a day for the next 1-2 weeks - if your blood pressure is more than 130/80 most of the time, call me. Weigh yourself daily.  If your weight increases by 3 or more pounds in 1 day or 5 or more pounds in 1 week, take an extra Lasix tablet that day.  If you need a refill on your cardiac medications before your next appointment, please call your pharmacy.

## 2017-08-28 ENCOUNTER — Ambulatory Visit: Payer: Medicare HMO | Admitting: Cardiology

## 2017-09-10 DIAGNOSIS — Z87891 Personal history of nicotine dependence: Secondary | ICD-10-CM | POA: Diagnosis not present

## 2017-09-10 DIAGNOSIS — E1165 Type 2 diabetes mellitus with hyperglycemia: Secondary | ICD-10-CM | POA: Diagnosis not present

## 2017-09-10 DIAGNOSIS — I5032 Chronic diastolic (congestive) heart failure: Secondary | ICD-10-CM | POA: Diagnosis not present

## 2017-09-10 DIAGNOSIS — Z6835 Body mass index (BMI) 35.0-35.9, adult: Secondary | ICD-10-CM | POA: Diagnosis not present

## 2017-09-10 DIAGNOSIS — E1122 Type 2 diabetes mellitus with diabetic chronic kidney disease: Secondary | ICD-10-CM | POA: Diagnosis not present

## 2017-09-10 DIAGNOSIS — Z794 Long term (current) use of insulin: Secondary | ICD-10-CM | POA: Diagnosis not present

## 2017-09-10 DIAGNOSIS — I130 Hypertensive heart and chronic kidney disease with heart failure and stage 1 through stage 4 chronic kidney disease, or unspecified chronic kidney disease: Secondary | ICD-10-CM | POA: Diagnosis not present

## 2017-09-10 DIAGNOSIS — N184 Chronic kidney disease, stage 4 (severe): Secondary | ICD-10-CM | POA: Diagnosis not present

## 2017-10-09 DIAGNOSIS — I5032 Chronic diastolic (congestive) heart failure: Secondary | ICD-10-CM | POA: Diagnosis not present

## 2017-10-09 DIAGNOSIS — E559 Vitamin D deficiency, unspecified: Secondary | ICD-10-CM | POA: Diagnosis not present

## 2017-10-09 DIAGNOSIS — E785 Hyperlipidemia, unspecified: Secondary | ICD-10-CM | POA: Diagnosis not present

## 2017-10-09 DIAGNOSIS — R1013 Epigastric pain: Secondary | ICD-10-CM | POA: Diagnosis not present

## 2017-10-09 DIAGNOSIS — J45909 Unspecified asthma, uncomplicated: Secondary | ICD-10-CM | POA: Diagnosis not present

## 2017-10-09 DIAGNOSIS — N183 Chronic kidney disease, stage 3 (moderate): Secondary | ICD-10-CM | POA: Diagnosis not present

## 2017-10-09 DIAGNOSIS — E1151 Type 2 diabetes mellitus with diabetic peripheral angiopathy without gangrene: Secondary | ICD-10-CM | POA: Diagnosis not present

## 2017-10-09 DIAGNOSIS — I119 Hypertensive heart disease without heart failure: Secondary | ICD-10-CM | POA: Diagnosis not present

## 2017-10-09 DIAGNOSIS — K254 Chronic or unspecified gastric ulcer with hemorrhage: Secondary | ICD-10-CM | POA: Diagnosis not present

## 2017-11-25 DIAGNOSIS — I10 Essential (primary) hypertension: Secondary | ICD-10-CM | POA: Diagnosis not present

## 2017-11-25 DIAGNOSIS — E039 Hypothyroidism, unspecified: Secondary | ICD-10-CM | POA: Diagnosis not present

## 2017-11-25 DIAGNOSIS — D649 Anemia, unspecified: Secondary | ICD-10-CM | POA: Diagnosis not present

## 2017-11-25 DIAGNOSIS — E7800 Pure hypercholesterolemia, unspecified: Secondary | ICD-10-CM | POA: Diagnosis not present

## 2017-11-25 DIAGNOSIS — R809 Proteinuria, unspecified: Secondary | ICD-10-CM | POA: Diagnosis not present

## 2017-11-25 DIAGNOSIS — N184 Chronic kidney disease, stage 4 (severe): Secondary | ICD-10-CM | POA: Diagnosis not present

## 2017-11-25 DIAGNOSIS — E119 Type 2 diabetes mellitus without complications: Secondary | ICD-10-CM | POA: Diagnosis not present

## 2017-11-25 DIAGNOSIS — N189 Chronic kidney disease, unspecified: Secondary | ICD-10-CM | POA: Diagnosis not present

## 2017-12-09 DIAGNOSIS — N19 Unspecified kidney failure: Secondary | ICD-10-CM | POA: Diagnosis not present

## 2017-12-09 DIAGNOSIS — N2889 Other specified disorders of kidney and ureter: Secondary | ICD-10-CM | POA: Diagnosis not present

## 2017-12-17 DIAGNOSIS — H409 Unspecified glaucoma: Secondary | ICD-10-CM | POA: Diagnosis not present

## 2017-12-17 DIAGNOSIS — I129 Hypertensive chronic kidney disease with stage 1 through stage 4 chronic kidney disease, or unspecified chronic kidney disease: Secondary | ICD-10-CM | POA: Diagnosis not present

## 2017-12-17 DIAGNOSIS — N184 Chronic kidney disease, stage 4 (severe): Secondary | ICD-10-CM | POA: Diagnosis not present

## 2017-12-17 DIAGNOSIS — E1169 Type 2 diabetes mellitus with other specified complication: Secondary | ICD-10-CM | POA: Diagnosis not present

## 2017-12-17 DIAGNOSIS — E1165 Type 2 diabetes mellitus with hyperglycemia: Secondary | ICD-10-CM | POA: Diagnosis not present

## 2017-12-17 DIAGNOSIS — Z6834 Body mass index (BMI) 34.0-34.9, adult: Secondary | ICD-10-CM | POA: Diagnosis not present

## 2017-12-17 DIAGNOSIS — E785 Hyperlipidemia, unspecified: Secondary | ICD-10-CM | POA: Diagnosis not present

## 2017-12-17 DIAGNOSIS — E1122 Type 2 diabetes mellitus with diabetic chronic kidney disease: Secondary | ICD-10-CM | POA: Diagnosis not present

## 2017-12-31 ENCOUNTER — Encounter: Payer: Self-pay | Admitting: Cardiology

## 2017-12-31 ENCOUNTER — Other Ambulatory Visit: Payer: Medicare HMO | Admitting: *Deleted

## 2017-12-31 DIAGNOSIS — E782 Mixed hyperlipidemia: Secondary | ICD-10-CM

## 2017-12-31 DIAGNOSIS — N183 Chronic kidney disease, stage 3 unspecified: Secondary | ICD-10-CM

## 2017-12-31 DIAGNOSIS — I5032 Chronic diastolic (congestive) heart failure: Secondary | ICD-10-CM

## 2017-12-31 LAB — COMPREHENSIVE METABOLIC PANEL
A/G RATIO: 1.4 (ref 1.2–2.2)
ALK PHOS: 51 IU/L (ref 39–117)
ALT: 16 IU/L (ref 0–32)
AST: 22 IU/L (ref 0–40)
Albumin: 3.9 g/dL (ref 3.5–4.7)
BILIRUBIN TOTAL: 0.2 mg/dL (ref 0.0–1.2)
BUN/Creatinine Ratio: 15 (ref 12–28)
BUN: 45 mg/dL — ABNORMAL HIGH (ref 8–27)
CALCIUM: 8.8 mg/dL (ref 8.7–10.3)
CHLORIDE: 101 mmol/L (ref 96–106)
CO2: 24 mmol/L (ref 20–29)
Creatinine, Ser: 2.93 mg/dL — ABNORMAL HIGH (ref 0.57–1.00)
GFR calc Af Amer: 16 mL/min/{1.73_m2} — ABNORMAL LOW (ref >59)
GFR, EST NON AFRICAN AMERICAN: 14 mL/min/{1.73_m2} — AB (ref >59)
GLOBULIN, TOTAL: 2.8 g/dL (ref 1.5–4.5)
Glucose: 93 mg/dL (ref 65–99)
POTASSIUM: 4.6 mmol/L (ref 3.5–5.2)
SODIUM: 139 mmol/L (ref 134–144)
Total Protein: 6.7 g/dL (ref 6.0–8.5)

## 2017-12-31 LAB — LIPID PANEL
CHOLESTEROL TOTAL: 142 mg/dL (ref 100–199)
Chol/HDL Ratio: 3.3 ratio (ref 0.0–4.4)
HDL: 43 mg/dL (ref >39)
LDL Calculated: 76 mg/dL (ref 0–99)
Triglycerides: 116 mg/dL (ref 0–149)
VLDL Cholesterol Cal: 23 mg/dL (ref 5–40)

## 2018-01-01 ENCOUNTER — Telehealth: Payer: Self-pay | Admitting: *Deleted

## 2018-01-01 DIAGNOSIS — N183 Chronic kidney disease, stage 3 unspecified: Secondary | ICD-10-CM

## 2018-01-01 NOTE — Telephone Encounter (Signed)
Pt has been notified of lab results by phone with verbal understanding. Pt was advised of needing lab repeated in 2 weeks though pt has appt with Dr. [name] on 01/08/18 and would like to repeat lab at that time. Pt thanked me for the call.

## 2018-01-01 NOTE — Telephone Encounter (Signed)
-----   Message from Liliane Shi, [place] sent at 01/01/2018  4:35 PM EDT ----- Renal function somewhat worse.  The potassium, LFTs (AST, ALT) are normal.  Lipids are optimal Continue current medications and follow up as planned.  Repeat BMET 2 weeks. Richardson Dopp, PA-C    01/01/2018 4:34 PM

## 2018-01-08 ENCOUNTER — Encounter: Payer: Self-pay | Admitting: Cardiology

## 2018-01-08 ENCOUNTER — Ambulatory Visit: Payer: Medicare HMO | Admitting: Cardiology

## 2018-01-08 VITALS — BP 160/70 | HR 78 | Ht 62.0 in | Wt 173.0 lb

## 2018-01-08 DIAGNOSIS — I5033 Acute on chronic diastolic (congestive) heart failure: Secondary | ICD-10-CM | POA: Diagnosis not present

## 2018-01-08 DIAGNOSIS — N183 Chronic kidney disease, stage 3 unspecified: Secondary | ICD-10-CM

## 2018-01-08 DIAGNOSIS — I10 Essential (primary) hypertension: Secondary | ICD-10-CM | POA: Diagnosis not present

## 2018-01-08 DIAGNOSIS — R0609 Other forms of dyspnea: Secondary | ICD-10-CM | POA: Diagnosis not present

## 2018-01-08 DIAGNOSIS — Z951 Presence of aortocoronary bypass graft: Secondary | ICD-10-CM

## 2018-01-08 MED ORDER — METOPROLOL TARTRATE 50 MG PO TABS: 50.0000 mg | ORAL_TABLET | Freq: Two times a day (BID) | ORAL | 3 refills | Status: DC

## 2018-01-08 NOTE — Patient Instructions (Signed)
Medication Instructions:  Your physician has recommended you make the following change in your medication:   INCREASE: metoprolol tartrate (lopressor) to 50 mg twice a day  Labwork: None ordered  Testing/Procedures: Your physician has requested that you have en exercise stress myoview. For further information please visit [email or web address]. Please follow instruction sheet, as given.  Follow-Up: Your physician recommends that you schedule a follow-up appointment in: 4 months with Dr. [name]    Any Other Special Instructions Will Be Listed Below (If Applicable).     If you need a refill on your cardiac medications before your next appointment, please call your pharmacy.

## 2018-01-08 NOTE — Progress Notes (Signed)
Cardiology Office Note:    Date:  01/08/2018   ID:  Monica [name], DOB [birth year], MRN [id number]  PCP:  Benito Mccreedy, MD  Cardiologist:  Dr. Ena Dawley   Electrophysiologist:  N/a Nephrologist: Dr. Joesph July [facility])  Referring MD: Benito Mccreedy, MD   Chief complain: DOE, hypertension  History of Present Illness:     Monica [name] is a 84 y.o. female with a hx of CAD s/p CABG in 3/17, HTN, HL, diabetes, asthma, GERD, gout, CKD, PUD. Admitted in 3/17 with unstable angina. LHC demonstrated severe 3 vessel CAD and she underwent CABG with LIMA-LAD, SVG-intermediate, SVG-OM, SVG-distal RCA. Postoperative course was complicated by blood loss anemia requiring transfusion with PRBCs.  Since her surgery, she has undergone bilateral thoracenteses for persistent pleural effusions.  Last seen by Dr. Servando Snare 12/13/15.    09/24/2016 - 3 months follow-up, the patient and she has been feeling well she was placed by primary care physician increased her fluid intake as she has been losing weight and was able to gain 16 pounds in the last 3 months. She has noticed worsening lower edema bilaterally. She was previously taking Lasix 40 mg twice a day and was advised by her nephrologist to cut down. Denies any orthopnea or paroxysmal nocturnal dyspnea. No palpitations or chest pain.  11/27/2016 - this is months follow-up, at the last visit her Lasix was increased to twice a day she had weight and BNP was 800. She has lost 3 pounds since then, she feels better, she wears compression stockings and has residual chronic lower extremity edema but no orthopnea or proximal nocturnal dyspnea. She denies any dizziness or syncope. No chest pain. She is able to do all activities of daily living including shopping and cooking.  03/26/2017 - the patient is coming after 4 months, she feels great, she is wearing compression stockings that keep her lower extremity edema mild. She continues to take Lasix 40 mg by mouth  twice a day and has no side effects. She also has no side effects with any other medications. She denies any orthopnea, paroxysmal nocturnal dyspnea and no chest pain, dizziness, or syncope no falls no memory problems or muscle pain with atorvastatin.  01/08/2018 - 9 months follow up, she has been doing well she denies any chest pain but has noticed worsening dyspnea on exertion, this discomfort by her granddaughter.  She is also noticed that her blood pressure has been elevated lately, her kidney function has worsened from baseline creatinine 2.2 to most recent of 2.9.  She is being followed by a nephrologist who has scheduled for renal ultrasound.  She has chronic mild lower extremity edema that is controlled by low-dose of Lasix 20 mg twice daily and compression socks.  She denies any orthopnea or proximal nocturnal dyspnea no fever or cough.  Past Medical History:  Diagnosis Date  . Asthma   . Bilateral pleural effusion    a. s/p CABG >> s/p bilat thoracentesis 11/2015  . Candida infection 10/2015   Bilateral breasts  . Chronic diastolic CHF (congestive heart failure) ([place])   . CKD (chronic kidney disease), stage III ([place])   . Coronary artery disease involving native coronary artery of native heart without angina pectoris    a. LHC 3/17 >> 3 v CAD >> CABG  . Essential hypertension   . GERD (gastroesophageal reflux disease)   . History of blood transfusion   . History of echocardiogram    a. Echo 3/17: Mild focal basal septal hypertrophy,  vigorous LVF, EF 65-70%, normal wall motion, grade 1 diastolic dysfunction, MAC  . Hyperlipidemia   . Mass   . Peptic ulcer   . Protein calorie malnutrition ([place])   . Type 2 diabetes mellitus ([place])    Past Surgical History:  Procedure Laterality Date  . ABDOMINAL HYSTERECTOMY    . CARDIAC CATHETERIZATION N/A 09/17/2015   Procedure: Left Heart Cath and Coronary Angiography;  Surgeon: Jettie Booze, MD;  Location: [facility];  Service:  Cardiovascular;  Laterality: N/A;  . COLON SURGERY    . CORONARY ARTERY BYPASS GRAFT N/A 09/19/2015   Procedure: CORONARY ARTERY BYPASS GRAFTING (CABG) TIMES FOUR USING BILATARAL SAPHENOUS VEIN GRAFTS AND LEFT INTERNAL MAMMARY ARTERY;  Surgeon: [name], MD;  Location: [facility];  Service: Open Heart Surgery;  Laterality: N/A;  . TEE WITHOUT CARDIOVERSION N/A 09/19/2015   Procedure: TRANSESOPHAGEAL ECHOCARDIOGRAM (TEE);  Surgeon: [name], MD;  Location: [facility];  Service: Open Heart Surgery;  Laterality: N/A;   Current Medications: Outpatient Medications Prior to Visit  Medication Sig Dispense Refill  . albuterol (PROVENTIL HFA;VENTOLIN HFA) 108 (90 Base) MCG/ACT inhaler Inhale 2 puffs into the lungs every 6 (six) hours as needed for wheezing or shortness of breath.    Marland Kitchen amLODipine (NORVASC) 5 MG tablet Take 1 tablet (5 mg total) by mouth daily. 90 tablet 2  . aspirin EC 81 MG EC tablet Take 1 tablet (81 mg total) by mouth daily.    Marland Kitchen atorvastatin (LIPITOR) 80 MG tablet Take 80 mg by mouth daily.     . Calcium Carb-Cholecalciferol [phone] MG-UNIT CAPS Take 1 tablet by mouth daily.    . calcium carbonate (TUMS - DOSED IN MG ELEMENTAL CALCIUM) 500 MG chewable tablet Chew 1 tablet by mouth daily as needed for indigestion or heartburn.    . cephALEXin (KEFLEX) 500 MG capsule Take 1 capsule (500 mg total) by mouth 2 (two) times daily. 14 capsule 0  . Cholecalciferol (VITAMIN D-3) 1000 units CAPS Take 1 capsule by mouth daily.    . diphenhydrAMINE (BENADRYL) 25 MG tablet Take 25 mg by mouth as needed for itching or allergies.     . ferrous sulfate 325 (65 FE) MG tablet Take 325 mg by mouth daily with breakfast.    . insulin glargine (LANTUS) 100 UNIT/ML injection INJECT 15 UNITS INTO THE SKIN IN THE MORNING, 10 UNITS INTO THE SKIN AT NIGHT BEFORE BEDTIME    . nitroGLYCERIN (NITROSTAT) 0.4 MG SL tablet PLACE 1 TABLET BY MOUTH UNDER THE TONGUE EVERY 5 MINUTES AS NEEDED FOR CHEST PAIN. FOR UP  TO 3 DOSES. 100 tablet 3  . metoprolol tartrate (LOPRESSOR) 25 MG tablet Take 1 tablet (25 mg total) by mouth 2 (two) times daily. 180 tablet 3  . furosemide (LASIX) 20 MG tablet Take 1 tablet (20 mg total) by mouth 2 (two) times daily. 180 tablet 1  . nystatin (MYCOSTATIN) powder Apply topically 2 (two) times daily. 100,000 units/gm powder applied under bilateral breasts BID x3 weeks     No facility-administered medications prior to visit.      Allergies:   Patient has no known allergies.   Social History   Socioeconomic History  . Marital status: Widowed    Spouse name: Not on file  . Number of children: Not on file  . Years of education: Not on file  . Highest education level: Not on file  Occupational History  . Occupation: Retired  Scientific laboratory technician  .  Financial resource strain: Not on file  . Food insecurity:    Worry: Not on file    Inability: Not on file  . Transportation needs:    Medical: Not on file    Non-medical: Not on file  Tobacco Use  . Smoking status: Never Smoker  . Smokeless tobacco: Former Systems developer    Types: Snuff  Substance and Sexual Activity  . Alcohol use: No    Alcohol/week: 0.0 oz  . Drug use: No  . Sexual activity: Not on file  Lifestyle  . Physical activity:    Days per week: Not on file    Minutes per session: Not on file  . Stress: Not on file  Relationships  . Social connections:    Talks on phone: Not on file    Gets together: Not on file    Attends religious service: Not on file    Active member of club or organization: Not on file    Attends meetings of clubs or organizations: Not on file    Relationship status: Not on file  Other Topics Concern  . Not on file  Social History Narrative  . Not on file    Family History:  The patient's family history includes Heart attack in her father.   ROS:   Please see the history of present illness.    Review of Systems  Cardiovascular: Positive for leg swelling.   All other systems reviewed and  are negative.  Physical Exam:    VS:  BP (!) 160/70   Pulse 78   Ht _0  (1.575 m)   Wt 173 lb (78.5 kg)   SpO2 99%   BMI 31.64 kg/m    Physical Exam  Constitutional: She is oriented to person, place, and time. She appears well-developed and well-nourished.  HENT:  Head: Normocephalic and atraumatic.  Neck: Neck supple. No JVD present.  Cardiovascular: Normal rate, regular rhythm and normal heart sounds.  No murmur heard. Pulmonary/Chest: Effort normal and breath sounds normal. She has no wheezes. She has no rales.  Abdominal: Soft. There is no tenderness.  Musculoskeletal:  1+ bilateral LE edema - compression stockings in place  Neurological: She is alert and oriented to person, place, and time.  Skin: Skin is warm and dry.  Psychiatric: She has a normal mood and affect.    Wt Readings from Last 3 Encounters:  01/08/18 173 lb (78.5 kg)  08/25/17 180 lb 1.9 oz (81.7 kg)  03/25/17 177 lb (80.3 kg)      Studies/Labs Reviewed:     EKG:  EKG is  ordered today.  The ekg ordered today demonstrates NSR, HR 74, normal axis, PRWP, QTc 472 ms, no sig changes.   Recent Labs: 03/25/2017: Hemoglobin 10.9; NT-Pro BNP 1,224; Platelets 237 12/31/2017: ALT 16; BUN 45; Creatinine, Ser 2.93; Potassium 4.6; Sodium 139   Recent Lipid Panel    Component Value Date/Time   CHOL 142 12/31/2017 0958   TRIG 116 12/31/2017 0958   HDL 43 12/31/2017 0958   CHOLHDL 3.3 12/31/2017 0958   CHOLHDL 2.8 06/26/2016 1004   VLDL 20 06/26/2016 1004   LDLCALC 76 12/31/2017 0958   Additional studies/ records that were reviewed today include:   Carotid US 09/18/15 Bilateral ICA 1-39%  LHC 09/17/15 LAD proximal 90%, D2 80% LCx ostial 99% RCA proximal 60%, distal 75%, RPDA 25% Severe three vessel disease. Plan for CVTS consult for CABG. She needs BP control, beta blocker and statin.  Echo 09/17/15 Mild  focal basal septal hypertrophy, vigorous LVF, EF 65-70%, normal wall motion, grade 1 diastolic  dysfunction, MAC   ASSESSMENT:     1. DOE (dyspnea on exertion)   2. S/P CABG x 3   3. Acute on chronic diastolic CHF (congestive heart failure), NYHA class 3 ([place])   4. CKD (chronic kidney disease), stage III ([place])   5. Essential hypertension    PLAN:     In order of problems listed above:  1. Acute on Chronic Diastolic CHF -stable, she has mild chronic persistent lower extremity edema control to compression stocking and Lasix 20 mg p.o. twice daily.  Creatinine has been elevated followed by nephrologist.  2. CAD - s/p CABG.she has worsening dyspnea on exertion, we will order a treadmill nuclear stress test.  Continue ASA, statin, beta-blocker.   3. HTN -elevated, I will increase metoprolol to 50 mg p.o. twice daily, we will evaluate blood pressure response during the stress test.  4. HL - LDL 70, TG 98.  HDL 49. All at goal considering she is diabetic. Continue atorvastatin 40 mg, no side effects.  LFTs normal.    5. CKD stage IV- Managed by Nephrology in [place], scheduled for renal ultrasound.  Follow-up in 4 months.  Medication Adjustments/Labs and Tests Ordered: Current medicines are reviewed at length with the patient today.  Concerns regarding medicines are outlined above.  Medication changes, Labs and Tests ordered today are outlined in the Patient Instructions noted below. Patient Instructions  Medication Instructions:  Your physician has recommended you make the following change in your medication:   INCREASE: metoprolol tartrate (lopressor) to 50 mg twice a day  Labwork: None ordered  Testing/Procedures: Your physician has requested that you have en exercise stress myoview. For further information please visit [email or web address]. Please follow instruction sheet, as given.  Follow-Up: Your physician recommends that you schedule a follow-up appointment in: 4 months with Dr. [name]    Any Other Special Instructions Will Be Listed Below (If Applicable).     If  you need a refill on your cardiac medications before your next appointment, please call your pharmacy.    Signed, Ena Dawley, MD  01/08/2018 1:46 PM    [facility] [address], [place], [place]  [address] Phone: [phone]; Fax: [phone]

## 2018-01-11 ENCOUNTER — Telehealth (HOSPITAL_COMMUNITY): Payer: Self-pay | Admitting: *Deleted

## 2018-01-11 NOTE — Telephone Encounter (Signed)
error 

## 2018-01-15 ENCOUNTER — Telehealth (HOSPITAL_COMMUNITY): Payer: Self-pay | Admitting: *Deleted

## 2018-01-15 NOTE — Telephone Encounter (Signed)
Patient given detailed instructions per Myocardial Perfusion Study Information Sheet for the test on 01/19/18. Patient notified to arrive 15 minutes early and that it is imperative to arrive on time for appointment to keep from having the test rescheduled.  If you need to cancel or reschedule your appointment, please call the office within 24 hours of your appointment. . Patient verbalized understanding. Kirstie Peri

## 2018-01-18 ENCOUNTER — Encounter: Payer: Self-pay | Admitting: Cardiology

## 2018-01-18 DIAGNOSIS — I5033 Acute on chronic diastolic (congestive) heart failure: Secondary | ICD-10-CM

## 2018-01-18 NOTE — Telephone Encounter (Signed)
Updated the pts med list to most current document scanned by her Daughter to Dr [name] and myself.  Changes reflected in her med list.

## 2018-01-19 ENCOUNTER — Other Ambulatory Visit: Payer: Medicare HMO

## 2018-01-19 ENCOUNTER — Ambulatory Visit (HOSPITAL_COMMUNITY): Payer: Medicare HMO | Attending: Cardiology

## 2018-01-19 DIAGNOSIS — R0609 Other forms of dyspnea: Secondary | ICD-10-CM

## 2018-01-19 DIAGNOSIS — N183 Chronic kidney disease, stage 3 unspecified: Secondary | ICD-10-CM

## 2018-01-19 DIAGNOSIS — R0600 Dyspnea, unspecified: Secondary | ICD-10-CM | POA: Diagnosis not present

## 2018-01-19 LAB — BASIC METABOLIC PANEL
BUN / CREAT RATIO: 17 (ref 12–28)
BUN: 49 mg/dL — AB (ref 8–27)
CALCIUM: 8.8 mg/dL (ref 8.7–10.3)
CHLORIDE: 104 mmol/L (ref 96–106)
CO2: 25 mmol/L (ref 20–29)
CREATININE: 2.83 mg/dL — AB (ref 0.57–1.00)
GFR calc non Af Amer: 15 mL/min/{1.73_m2} — ABNORMAL LOW (ref >59)
GFR, EST AFRICAN AMERICAN: 17 mL/min/{1.73_m2} — AB (ref >59)
Glucose: 253 mg/dL — ABNORMAL HIGH (ref 65–99)
Potassium: 5.1 mmol/L (ref 3.5–5.2)
Sodium: 138 mmol/L (ref 134–144)

## 2018-01-19 LAB — MYOCARDIAL PERFUSION IMAGING
LV dias vol: 98 mL (ref 46–106)
LV sys vol: 43 mL
Peak HR: 96 {beats}/min
RATE: 0.28
Rest HR: 91 {beats}/min
SDS: 6
SRS: 5
SSS: 11
TID: 1

## 2018-01-19 MED ORDER — REGADENOSON 0.4 MG/5ML IV SOLN
0.4000 mg | Freq: Once | INTRAVENOUS | Status: AC
  Administered 2018-01-19: 0.4 mg via INTRAVENOUS

## 2018-01-19 MED ORDER — TECHNETIUM TC 99M TETROFOSMIN IV KIT
10.3000 | PACK | Freq: Once | INTRAVENOUS | Status: AC | PRN
  Administered 2018-01-19: 10.3 via INTRAVENOUS
  Filled 2018-01-19: qty 11

## 2018-01-19 MED ORDER — TECHNETIUM TC 99M TETROFOSMIN IV KIT
31.2000 | PACK | Freq: Once | INTRAVENOUS | Status: AC | PRN
  Administered 2018-01-19: 31.2 via INTRAVENOUS
  Filled 2018-01-19: qty 32

## 2018-01-20 DIAGNOSIS — R809 Proteinuria, unspecified: Secondary | ICD-10-CM | POA: Diagnosis not present

## 2018-01-20 DIAGNOSIS — N183 Chronic kidney disease, stage 3 (moderate): Secondary | ICD-10-CM | POA: Diagnosis not present

## 2018-01-20 DIAGNOSIS — R309 Painful micturition, unspecified: Secondary | ICD-10-CM | POA: Diagnosis not present

## 2018-01-20 DIAGNOSIS — I10 Essential (primary) hypertension: Secondary | ICD-10-CM | POA: Diagnosis not present

## 2018-01-21 DIAGNOSIS — Z0110 Encounter for examination of ears and hearing without abnormal findings: Secondary | ICD-10-CM | POA: Diagnosis not present

## 2018-01-21 DIAGNOSIS — E559 Vitamin D deficiency, unspecified: Secondary | ICD-10-CM | POA: Diagnosis not present

## 2018-01-21 DIAGNOSIS — K254 Chronic or unspecified gastric ulcer with hemorrhage: Secondary | ICD-10-CM | POA: Diagnosis not present

## 2018-01-21 DIAGNOSIS — I119 Hypertensive heart disease without heart failure: Secondary | ICD-10-CM | POA: Diagnosis not present

## 2018-01-21 DIAGNOSIS — J45909 Unspecified asthma, uncomplicated: Secondary | ICD-10-CM | POA: Diagnosis not present

## 2018-01-21 DIAGNOSIS — Z0000 Encounter for general adult medical examination without abnormal findings: Secondary | ICD-10-CM | POA: Diagnosis not present

## 2018-01-21 DIAGNOSIS — E785 Hyperlipidemia, unspecified: Secondary | ICD-10-CM | POA: Diagnosis not present

## 2018-01-21 DIAGNOSIS — I5032 Chronic diastolic (congestive) heart failure: Secondary | ICD-10-CM | POA: Diagnosis not present

## 2018-01-21 DIAGNOSIS — E1151 Type 2 diabetes mellitus with diabetic peripheral angiopathy without gangrene: Secondary | ICD-10-CM | POA: Diagnosis not present

## 2018-01-26 ENCOUNTER — Other Ambulatory Visit: Payer: Self-pay | Admitting: Cardiology

## 2018-01-26 MED ORDER — METOPROLOL TARTRATE 50 MG PO TABS: 50.0000 mg | ORAL_TABLET | Freq: Two times a day (BID) | ORAL | 3 refills | Status: DC

## 2018-01-26 NOTE — Telephone Encounter (Signed)
Pt's medication were sent to pt's pharmacy as requested. Confirmation received.  

## 2018-01-26 NOTE — Telephone Encounter (Signed)
New Message        *STAT* If patient is at the pharmacy, call can be transferred to refill team.   1. Which medications need to be refilled? (please list name of each medication and dose if known) Metoprolol  2. Which pharmacy/location (including street and city if local pharmacy) is medication to be sent to?[facility] pharmacy  3. Do they need a 30 day or 90 day supply? [place]

## 2018-02-05 DIAGNOSIS — I739 Peripheral vascular disease, unspecified: Secondary | ICD-10-CM | POA: Diagnosis not present

## 2018-02-05 DIAGNOSIS — E1151 Type 2 diabetes mellitus with diabetic peripheral angiopathy without gangrene: Secondary | ICD-10-CM | POA: Insufficient documentation

## 2018-02-05 DIAGNOSIS — M21612 Bunion of left foot: Secondary | ICD-10-CM | POA: Diagnosis not present

## 2018-02-05 DIAGNOSIS — M21611 Bunion of right foot: Secondary | ICD-10-CM | POA: Diagnosis not present

## 2018-02-05 DIAGNOSIS — M10471 Other secondary gout, right ankle and foot: Secondary | ICD-10-CM | POA: Diagnosis not present

## 2018-02-05 DIAGNOSIS — R600 Localized edema: Secondary | ICD-10-CM | POA: Diagnosis not present

## 2018-02-05 DIAGNOSIS — E1142 Type 2 diabetes mellitus with diabetic polyneuropathy: Secondary | ICD-10-CM | POA: Diagnosis not present

## 2018-02-22 DIAGNOSIS — Z1231 Encounter for screening mammogram for malignant neoplasm of breast: Secondary | ICD-10-CM | POA: Diagnosis not present

## 2018-03-02 DIAGNOSIS — R928 Other abnormal and inconclusive findings on diagnostic imaging of breast: Secondary | ICD-10-CM | POA: Diagnosis not present

## 2018-03-07 DIAGNOSIS — R5383 Other fatigue: Secondary | ICD-10-CM | POA: Diagnosis not present

## 2018-03-07 DIAGNOSIS — R531 Weakness: Secondary | ICD-10-CM | POA: Diagnosis not present

## 2018-03-07 DIAGNOSIS — R630 Anorexia: Secondary | ICD-10-CM | POA: Diagnosis not present

## 2018-03-07 DIAGNOSIS — I10 Essential (primary) hypertension: Secondary | ICD-10-CM | POA: Diagnosis not present

## 2018-03-09 DIAGNOSIS — E785 Hyperlipidemia, unspecified: Secondary | ICD-10-CM | POA: Diagnosis not present

## 2018-03-09 DIAGNOSIS — R531 Weakness: Secondary | ICD-10-CM | POA: Diagnosis not present

## 2018-03-09 DIAGNOSIS — E559 Vitamin D deficiency, unspecified: Secondary | ICD-10-CM | POA: Diagnosis not present

## 2018-03-09 DIAGNOSIS — E1151 Type 2 diabetes mellitus with diabetic peripheral angiopathy without gangrene: Secondary | ICD-10-CM | POA: Diagnosis not present

## 2018-03-09 DIAGNOSIS — K254 Chronic or unspecified gastric ulcer with hemorrhage: Secondary | ICD-10-CM | POA: Diagnosis not present

## 2018-03-09 DIAGNOSIS — J45909 Unspecified asthma, uncomplicated: Secondary | ICD-10-CM | POA: Diagnosis not present

## 2018-03-09 DIAGNOSIS — I119 Hypertensive heart disease without heart failure: Secondary | ICD-10-CM | POA: Diagnosis not present

## 2018-03-09 DIAGNOSIS — I5032 Chronic diastolic (congestive) heart failure: Secondary | ICD-10-CM | POA: Diagnosis not present

## 2018-03-09 DIAGNOSIS — N183 Chronic kidney disease, stage 3 (moderate): Secondary | ICD-10-CM | POA: Diagnosis not present

## 2018-03-11 DIAGNOSIS — D649 Anemia, unspecified: Secondary | ICD-10-CM | POA: Diagnosis not present

## 2018-03-11 DIAGNOSIS — N184 Chronic kidney disease, stage 4 (severe): Secondary | ICD-10-CM | POA: Diagnosis not present

## 2018-03-11 DIAGNOSIS — I10 Essential (primary) hypertension: Secondary | ICD-10-CM | POA: Diagnosis not present

## 2018-03-11 DIAGNOSIS — E039 Hypothyroidism, unspecified: Secondary | ICD-10-CM | POA: Diagnosis not present

## 2018-03-19 DIAGNOSIS — I130 Hypertensive heart and chronic kidney disease with heart failure and stage 1 through stage 4 chronic kidney disease, or unspecified chronic kidney disease: Secondary | ICD-10-CM | POA: Diagnosis not present

## 2018-03-19 DIAGNOSIS — I5032 Chronic diastolic (congestive) heart failure: Secondary | ICD-10-CM | POA: Diagnosis not present

## 2018-03-19 DIAGNOSIS — E559 Vitamin D deficiency, unspecified: Secondary | ICD-10-CM | POA: Diagnosis not present

## 2018-03-19 DIAGNOSIS — N184 Chronic kidney disease, stage 4 (severe): Secondary | ICD-10-CM | POA: Diagnosis not present

## 2018-03-19 DIAGNOSIS — E1122 Type 2 diabetes mellitus with diabetic chronic kidney disease: Secondary | ICD-10-CM | POA: Diagnosis not present

## 2018-03-19 DIAGNOSIS — Z794 Long term (current) use of insulin: Secondary | ICD-10-CM | POA: Diagnosis not present

## 2018-03-19 DIAGNOSIS — Z87891 Personal history of nicotine dependence: Secondary | ICD-10-CM | POA: Diagnosis not present

## 2018-03-19 DIAGNOSIS — Z634 Disappearance and death of family member: Secondary | ICD-10-CM | POA: Diagnosis not present

## 2018-03-22 DIAGNOSIS — E559 Vitamin D deficiency, unspecified: Secondary | ICD-10-CM | POA: Diagnosis not present

## 2018-03-22 DIAGNOSIS — E785 Hyperlipidemia, unspecified: Secondary | ICD-10-CM | POA: Diagnosis not present

## 2018-03-22 DIAGNOSIS — K254 Chronic or unspecified gastric ulcer with hemorrhage: Secondary | ICD-10-CM | POA: Diagnosis not present

## 2018-03-22 DIAGNOSIS — I119 Hypertensive heart disease without heart failure: Secondary | ICD-10-CM | POA: Diagnosis not present

## 2018-03-22 DIAGNOSIS — J45909 Unspecified asthma, uncomplicated: Secondary | ICD-10-CM | POA: Diagnosis not present

## 2018-03-22 DIAGNOSIS — I5032 Chronic diastolic (congestive) heart failure: Secondary | ICD-10-CM | POA: Diagnosis not present

## 2018-03-22 DIAGNOSIS — N183 Chronic kidney disease, stage 3 (moderate): Secondary | ICD-10-CM | POA: Diagnosis not present

## 2018-03-22 DIAGNOSIS — E1151 Type 2 diabetes mellitus with diabetic peripheral angiopathy without gangrene: Secondary | ICD-10-CM | POA: Diagnosis not present

## 2018-04-08 DIAGNOSIS — D649 Anemia, unspecified: Secondary | ICD-10-CM | POA: Diagnosis not present

## 2018-04-08 DIAGNOSIS — N184 Chronic kidney disease, stage 4 (severe): Secondary | ICD-10-CM | POA: Diagnosis not present

## 2018-04-08 DIAGNOSIS — Z23 Encounter for immunization: Secondary | ICD-10-CM | POA: Diagnosis not present

## 2018-04-08 DIAGNOSIS — N189 Chronic kidney disease, unspecified: Secondary | ICD-10-CM | POA: Diagnosis not present

## 2018-04-08 DIAGNOSIS — E211 Secondary hyperparathyroidism, not elsewhere classified: Secondary | ICD-10-CM | POA: Diagnosis not present

## 2018-04-29 DIAGNOSIS — E785 Hyperlipidemia, unspecified: Secondary | ICD-10-CM | POA: Diagnosis not present

## 2018-04-29 DIAGNOSIS — E559 Vitamin D deficiency, unspecified: Secondary | ICD-10-CM | POA: Diagnosis not present

## 2018-04-29 DIAGNOSIS — N183 Chronic kidney disease, stage 3 (moderate): Secondary | ICD-10-CM | POA: Diagnosis not present

## 2018-04-29 DIAGNOSIS — J45909 Unspecified asthma, uncomplicated: Secondary | ICD-10-CM | POA: Diagnosis not present

## 2018-04-29 DIAGNOSIS — I5032 Chronic diastolic (congestive) heart failure: Secondary | ICD-10-CM | POA: Diagnosis not present

## 2018-04-29 DIAGNOSIS — I119 Hypertensive heart disease without heart failure: Secondary | ICD-10-CM | POA: Diagnosis not present

## 2018-04-29 DIAGNOSIS — E1151 Type 2 diabetes mellitus with diabetic peripheral angiopathy without gangrene: Secondary | ICD-10-CM | POA: Diagnosis not present

## 2018-04-29 DIAGNOSIS — K254 Chronic or unspecified gastric ulcer with hemorrhage: Secondary | ICD-10-CM | POA: Diagnosis not present

## 2018-05-14 ENCOUNTER — Other Ambulatory Visit: Payer: Self-pay | Admitting: Cardiology

## 2018-05-14 MED ORDER — FUROSEMIDE 40 MG PO TABS: 40.0000 mg | ORAL_TABLET | Freq: Every day | ORAL | 8 refills | Status: DC

## 2018-05-27 ENCOUNTER — Ambulatory Visit: Payer: Medicare HMO | Admitting: Cardiology

## 2018-05-27 ENCOUNTER — Encounter: Payer: Self-pay | Admitting: Cardiology

## 2018-05-27 VITALS — BP 146/72 | HR 68 | Ht 62.0 in | Wt 176.0 lb

## 2018-05-27 DIAGNOSIS — E782 Mixed hyperlipidemia: Secondary | ICD-10-CM

## 2018-05-27 DIAGNOSIS — I2510 Atherosclerotic heart disease of native coronary artery without angina pectoris: Secondary | ICD-10-CM

## 2018-05-27 DIAGNOSIS — I10 Essential (primary) hypertension: Secondary | ICD-10-CM

## 2018-05-27 MED ORDER — ISOSORBIDE MONONITRATE ER 30 MG PO TB24: 15.0000 mg | ORAL_TABLET | Freq: Every day | ORAL | 3 refills | Status: DC

## 2018-05-27 NOTE — Progress Notes (Signed)
Cardiology Office Note:    Date:  05/27/2018   ID:  Monica [name], DOB [birth year], MRN [id number]  PCP:  Monica Mccreedy, MD  Cardiologist:  Dr. Ena [name]   Electrophysiologist:  N/a Nephrologist: Dr. Joesph [name] [facility])  Referring MD: Monica Mccreedy, MD   Chief complain: DOE, hypertension  History of Present Illness:     Monica [name] is a 84 y.o. female with a hx of CAD s/p CABG in 3/17, HTN, HL, diabetes, asthma, GERD, gout, CKD, PUD. Admitted in 3/17 with unstable angina. LHC demonstrated severe 3 vessel CAD and she underwent CABG with LIMA-LAD, SVG-intermediate, SVG-OM, SVG-distal RCA. Postoperative course was complicated by blood loss anemia requiring transfusion with PRBCs.  Since her surgery, she has undergone bilateral thoracenteses for persistent pleural effusions.  Last seen by Dr. Servando Snare 12/13/15.    01/08/2018 - 9 months follow up, she has been doing well she denies any chest pain but has noticed worsening dyspnea on exertion, this discomfort by her granddaughter.  She is also noticed that her blood pressure has been elevated lately, her kidney function has worsened from baseline creatinine 2.2 to most recent of 2.9.  She is being followed by a nephrologist who has scheduled for renal ultrasound.  She has chronic mild lower extremity edema that is controlled by low-dose of Lasix 20 mg twice daily and compression socks.  She denies any orthopnea or proximal nocturnal dyspnea no fever or cough.  05/27/2018 -this is 4 months follow-up, the patient has been doing well, she denies any chest pain, and has stable shortness of breath on exertion. She reports one episode of chest pain after eating food fast that resolved with nitroglycerin.  She states that her blood pressure has been elevated.  Denies any dizziness or syncope.  Past Medical History:  Diagnosis Date  . Asthma   . Bilateral pleural effusion    a. s/p CABG >> s/p bilat thoracentesis 11/2015  . Candida infection  10/2015   Bilateral breasts  . Chronic diastolic CHF (congestive heart failure) ([place])   . CKD (chronic kidney disease), stage III ([place])   . Coronary artery disease involving native coronary artery of native heart without angina pectoris    a. LHC 3/17 >> 3 v CAD >> CABG  . Essential hypertension   . GERD (gastroesophageal reflux disease)   . History of blood transfusion   . History of echocardiogram    a. Echo 3/17: Mild focal basal septal hypertrophy, vigorous LVF, EF 65-70%, normal wall motion, grade 1 diastolic dysfunction, MAC  . Hyperlipidemia   . Mass   . Peptic ulcer   . Protein calorie malnutrition ([place])   . Type 2 diabetes mellitus ([place])    Past Surgical History:  Procedure Laterality Date  . ABDOMINAL HYSTERECTOMY    . CARDIAC CATHETERIZATION N/A 09/17/2015   Procedure: Left Heart Cath and Coronary Angiography;  Surgeon: Jettie Booze, MD;  Location: [facility];  Service: Cardiovascular;  Laterality: N/A;  . COLON SURGERY    . CORONARY ARTERY BYPASS GRAFT N/A 09/19/2015   Procedure: CORONARY ARTERY BYPASS GRAFTING (CABG) TIMES FOUR USING BILATARAL SAPHENOUS VEIN GRAFTS AND LEFT INTERNAL MAMMARY ARTERY;  Surgeon: [name], MD;  Location: [facility];  Service: Open Heart Surgery;  Laterality: N/A;  . TEE WITHOUT CARDIOVERSION N/A 09/19/2015   Procedure: TRANSESOPHAGEAL ECHOCARDIOGRAM (TEE);  Surgeon: [name], MD;  Location: [facility];  Service: Open Heart Surgery;  Laterality: N/A;   Current Medications: Outpatient Medications  Prior to Visit  Medication Sig Dispense Refill  . albuterol (PROVENTIL HFA;VENTOLIN HFA) 108 (90 Base) MCG/ACT inhaler Inhale 2 puffs into the lungs every 6 (six) hours as needed for wheezing or shortness of breath.    Marland Kitchen amLODipine (NORVASC) 10 MG tablet Take 10 mg by mouth daily.    Marland Kitchen aspirin EC 81 MG EC tablet Take 1 tablet (81 mg total) by mouth daily.    Marland Kitchen atorvastatin (LIPITOR) 40 MG tablet Take 40 mg by mouth daily.    .  Calcium Carb-Cholecalciferol [phone] MG-UNIT CAPS Take 1 tablet by mouth daily.    . calcium carbonate (TUMS - DOSED IN MG ELEMENTAL CALCIUM) 500 MG chewable tablet Chew 1 tablet by mouth daily as needed for indigestion or heartburn.    . Cholecalciferol (VITAMIN D-3) 1000 units CAPS Take 1 capsule by mouth daily.    . diphenhydrAMINE (BENADRYL) 25 MG tablet Take 25 mg by mouth as needed for itching or allergies.     . ferrous sulfate 325 (65 FE) MG tablet Take 325 mg by mouth daily with breakfast.    . furosemide (LASIX) 40 MG tablet Take 1 tablet (40 mg total) by mouth daily. May take an additional 20 mg as needed. 45 tablet 8  . insulin glargine (LANTUS) 100 UNIT/ML injection INJECT 15 UNITS INTO THE SKIN IN THE MORNING, 10 UNITS INTO THE SKIN AT NIGHT BEFORE BEDTIME    . metoprolol tartrate (LOPRESSOR) 50 MG tablet Take 1 tablet (50 mg total) by mouth 2 (two) times daily. 180 tablet 3  . nitroGLYCERIN (NITROSTAT) 0.4 MG SL tablet PLACE 1 TABLET BY MOUTH UNDER THE TONGUE EVERY 5 MINUTES AS NEEDED FOR CHEST PAIN. FOR UP TO 3 DOSES. 100 tablet 3  . vitamin B-12 (CYANOCOBALAMIN) 1000 MCG tablet Take 1,000 mcg by mouth daily.    . cephALEXin (KEFLEX) 500 MG capsule Take 1 capsule (500 mg total) by mouth 2 (two) times daily. 14 capsule 0   No facility-administered medications prior to visit.      Allergies:   Patient has no known allergies.   Social History   Socioeconomic History  . Marital status: Widowed    Spouse name: Not on file  . Number of children: Not on file  . Years of education: Not on file  . Highest education level: Not on file  Occupational History  . Occupation: Retired  Scientific laboratory technician  . Financial resource strain: Not on file  . Food insecurity:    Worry: Not on file    Inability: Not on file  . Transportation needs:    Medical: Not on file    Non-medical: Not on file  Tobacco Use  . Smoking status: Never Smoker  . Smokeless tobacco: Former Systems developer    Types: Snuff    Substance and Sexual Activity  . Alcohol use: No    Alcohol/week: 0.0 standard drinks  . Drug use: No  . Sexual activity: Not on file  Lifestyle  . Physical activity:    Days per week: Not on file    Minutes per session: Not on file  . Stress: Not on file  Relationships  . Social connections:    Talks on phone: Not on file    Gets together: Not on file    Attends religious service: Not on file    Active member of club or organization: Not on file    Attends meetings of clubs or organizations: Not on file    Relationship status: Not  on file  Other Topics Concern  . Not on file  Social History Narrative  . Not on file    Family History:  The patient's family history includes Heart attack in her father.   ROS:   Please see the history of present illness.    Review of Systems  Cardiovascular: Positive for leg swelling.   All other systems reviewed and are negative.  Physical Exam:    VS:  BP (!) 146/72   Pulse 68   Ht _0  (1.575 m)   Wt 176 lb (79.8 kg)   BMI 32.19 kg/m    Physical Exam  Constitutional: She is oriented to person, place, and time. She appears well-developed and well-nourished.  HENT:  Head: Normocephalic and atraumatic.  Neck: Neck supple. No JVD present.  Cardiovascular: Normal rate, regular rhythm and normal heart sounds.  No murmur heard. Pulmonary/Chest: Effort normal and breath sounds normal. She has no wheezes. She has no rales.  Abdominal: Soft. There is no tenderness.  Musculoskeletal:  1+ bilateral LE edema - compression stockings in place  Neurological: She is alert and oriented to person, place, and time.  Skin: Skin is warm and dry.  Psychiatric: She has a normal mood and affect.    Wt Readings from Last 3 Encounters:  05/27/18 176 lb (79.8 kg)  01/19/18 173 lb (78.5 kg)  01/08/18 173 lb (78.5 kg)      Studies/Labs Reviewed:     EKG:  EKG is  ordered today.  The ekg ordered today demonstrates NSR, HR 74, normal axis, PRWP,  QTc 472 ms, no sig changes.   Recent Labs: 12/31/2017: ALT 16 01/19/2018: BUN 49; Creatinine, Ser 2.83; Potassium 5.1; Sodium 138   Recent Lipid Panel    Component Value Date/Time   CHOL 142 12/31/2017 0958   TRIG 116 12/31/2017 0958   HDL 43 12/31/2017 0958   CHOLHDL 3.3 12/31/2017 0958   CHOLHDL 2.8 06/26/2016 1004   VLDL 20 06/26/2016 1004   LDLCALC 76 12/31/2017 0958   Additional studies/ records that were reviewed today include:   Carotid US 09/18/15 Bilateral ICA 1-39%  LHC 09/17/15 LAD proximal 90%, D2 80% LCx ostial 99% RCA proximal 60%, distal 75%, RPDA 25% Severe three vessel disease. Plan for CVTS consult for CABG. She needs BP control, beta blocker and statin.  Echo 09/17/15 Mild focal basal septal hypertrophy, vigorous LVF, EF 65-70%, normal wall motion, grade 1 diastolic dysfunction, MAC   ASSESSMENT:     1. Coronary artery disease involving native coronary artery of native heart without angina pectoris   2. Mixed hyperlipidemia   3. Essential hypertension    PLAN:     In order of problems listed above:  1. Acute on Chronic Diastolic CHF -stable, she has mild chronic persistent lower extremity edema control to compression stocking and Lasix 20 mg p.o. twice daily.  She has CKD stage IV and is being followed by nephrologist.  2. CAD - s/p CABG.she underwent nuclear stress test in [name] with finding of scar in the anterior wall and no ischemia.  Given elevated blood pressure I will add Imdur 50 mg daily.  3. HTN -elevated, I will add Imdur 50 mg daily.  4. HL -all lipids at goal, normal LFTs.  5. CKD stage IV- Managed by Nephrology in [place], scheduled for renal ultrasound.  Follow-up in 4 months.  Medication Adjustments/Labs and Tests Ordered: Current medicines are reviewed at length with the patient today.  Concerns regarding medicines are  outlined above.  Medication changes, Labs and Tests ordered today are outlined in the Patient Instructions noted  below. Patient Instructions  Medication Instructions:   START TAKING IMDUR 15 MG ONCE DAILY  If you need a refill on your cardiac medications before your next appointment, please call your pharmacy.      Follow-Up: At [facility], you and your health needs are our priority.  As part of our continuing mission to provide you with exceptional heart care, we have created designated Provider Care Teams.  These Care Teams include your primary Cardiologist (physician) and Advanced Practice Providers (APPs -  Physician Assistants and Nurse Practitioners) who all work together to provide you with the care you need, when you need it. You will need a follow up appointment in 4 months.  Please call our office 2 months in advance to schedule this appointment.  You may see Monica Dawley, MD or one of the following Advanced Practice Providers on your designated Care Team:   [place], PA-C Melina Copa, PA-C . Ermalinda Barrios, PA-C      Signed, Monica Dawley, MD  05/27/2018 2:45 PM    [facility] [address], [place], [place]  [address] Phone: [phone]; Fax: [phone]

## 2018-05-27 NOTE — Patient Instructions (Signed)
Medication Instructions:   START TAKING IMDUR 15 MG ONCE DAILY  If you need a refill on your cardiac medications before your next appointment, please call your pharmacy.      Follow-Up: At [facility], you and your health needs are our priority.  As part of our continuing mission to provide you with exceptional heart care, we have created designated Provider Care Teams.  These Care Teams include your primary Cardiologist (physician) and Advanced Practice Providers (APPs -  Physician Assistants and Nurse Practitioners) who all work together to provide you with the care you need, when you need it. You will need a follow up appointment in 4 months.  Please call our office 2 months in advance to schedule this appointment.  You may see Ena Dawley, MD or one of the following Advanced Practice Providers on your designated Care Team:   [place], PA-C Melina Copa, PA-C . Ermalinda Barrios, PA-C

## 2018-06-03 DIAGNOSIS — R079 Chest pain, unspecified: Secondary | ICD-10-CM | POA: Diagnosis not present

## 2018-06-03 DIAGNOSIS — D649 Anemia, unspecified: Secondary | ICD-10-CM | POA: Diagnosis not present

## 2018-06-03 DIAGNOSIS — R918 Other nonspecific abnormal finding of lung field: Secondary | ICD-10-CM | POA: Diagnosis not present

## 2018-06-03 DIAGNOSIS — J90 Pleural effusion, not elsewhere classified: Secondary | ICD-10-CM | POA: Diagnosis not present

## 2018-06-03 DIAGNOSIS — N186 End stage renal disease: Secondary | ICD-10-CM | POA: Diagnosis not present

## 2018-06-03 DIAGNOSIS — J811 Chronic pulmonary edema: Secondary | ICD-10-CM | POA: Diagnosis not present

## 2018-06-03 DIAGNOSIS — E8770 Fluid overload, unspecified: Secondary | ICD-10-CM | POA: Diagnosis not present

## 2018-06-03 DIAGNOSIS — N184 Chronic kidney disease, stage 4 (severe): Secondary | ICD-10-CM | POA: Diagnosis not present

## 2018-06-03 DIAGNOSIS — Z951 Presence of aortocoronary bypass graft: Secondary | ICD-10-CM | POA: Diagnosis not present

## 2018-06-03 DIAGNOSIS — I25110 Atherosclerotic heart disease of native coronary artery with unstable angina pectoris: Secondary | ICD-10-CM | POA: Diagnosis not present

## 2018-06-03 DIAGNOSIS — J810 Acute pulmonary edema: Secondary | ICD-10-CM | POA: Diagnosis not present

## 2018-06-03 DIAGNOSIS — I2510 Atherosclerotic heart disease of native coronary artery without angina pectoris: Secondary | ICD-10-CM | POA: Diagnosis not present

## 2018-06-03 DIAGNOSIS — I5032 Chronic diastolic (congestive) heart failure: Secondary | ICD-10-CM | POA: Diagnosis not present

## 2018-06-03 DIAGNOSIS — Z9911 Dependence on respirator [ventilator] status: Secondary | ICD-10-CM | POA: Diagnosis not present

## 2018-06-03 DIAGNOSIS — R0602 Shortness of breath: Secondary | ICD-10-CM | POA: Diagnosis not present

## 2018-06-03 DIAGNOSIS — J9601 Acute respiratory failure with hypoxia: Secondary | ICD-10-CM | POA: Diagnosis not present

## 2018-06-03 DIAGNOSIS — K5900 Constipation, unspecified: Secondary | ICD-10-CM | POA: Diagnosis not present

## 2018-06-03 DIAGNOSIS — I200 Unstable angina: Secondary | ICD-10-CM | POA: Diagnosis not present

## 2018-06-03 DIAGNOSIS — R1084 Generalized abdominal pain: Secondary | ICD-10-CM | POA: Diagnosis not present

## 2018-06-03 DIAGNOSIS — R0689 Other abnormalities of breathing: Secondary | ICD-10-CM | POA: Diagnosis not present

## 2018-06-03 DIAGNOSIS — I10 Essential (primary) hypertension: Secondary | ICD-10-CM | POA: Diagnosis not present

## 2018-06-03 DIAGNOSIS — Z7902 Long term (current) use of antithrombotics/antiplatelets: Secondary | ICD-10-CM | POA: Diagnosis not present

## 2018-06-03 DIAGNOSIS — I509 Heart failure, unspecified: Secondary | ICD-10-CM | POA: Diagnosis not present

## 2018-06-03 DIAGNOSIS — E662 Morbid (severe) obesity with alveolar hypoventilation: Secondary | ICD-10-CM | POA: Diagnosis not present

## 2018-06-03 DIAGNOSIS — Z4901 Encounter for fitting and adjustment of extracorporeal dialysis catheter: Secondary | ICD-10-CM | POA: Diagnosis not present

## 2018-06-03 DIAGNOSIS — E872 Acidosis: Secondary | ICD-10-CM | POA: Diagnosis not present

## 2018-06-03 DIAGNOSIS — D631 Anemia in chronic kidney disease: Secondary | ICD-10-CM | POA: Diagnosis not present

## 2018-06-03 DIAGNOSIS — I214 Non-ST elevation (NSTEMI) myocardial infarction: Secondary | ICD-10-CM | POA: Diagnosis not present

## 2018-06-03 DIAGNOSIS — R0902 Hypoxemia: Secondary | ICD-10-CM | POA: Diagnosis not present

## 2018-06-03 DIAGNOSIS — I249 Acute ischemic heart disease, unspecified: Secondary | ICD-10-CM | POA: Diagnosis not present

## 2018-06-03 DIAGNOSIS — N179 Acute kidney failure, unspecified: Secondary | ICD-10-CM | POA: Diagnosis not present

## 2018-06-03 DIAGNOSIS — Z7982 Long term (current) use of aspirin: Secondary | ICD-10-CM | POA: Diagnosis not present

## 2018-06-03 DIAGNOSIS — R0600 Dyspnea, unspecified: Secondary | ICD-10-CM | POA: Diagnosis not present

## 2018-06-03 DIAGNOSIS — I132 Hypertensive heart and chronic kidney disease with heart failure and with stage 5 chronic kidney disease, or end stage renal disease: Secondary | ICD-10-CM | POA: Diagnosis not present

## 2018-06-03 DIAGNOSIS — N189 Chronic kidney disease, unspecified: Secondary | ICD-10-CM | POA: Diagnosis not present

## 2018-06-03 DIAGNOSIS — R600 Localized edema: Secondary | ICD-10-CM | POA: Diagnosis not present

## 2018-06-03 DIAGNOSIS — J9602 Acute respiratory failure with hypercapnia: Secondary | ICD-10-CM | POA: Diagnosis not present

## 2018-06-03 DIAGNOSIS — Z79899 Other long term (current) drug therapy: Secondary | ICD-10-CM | POA: Diagnosis not present

## 2018-06-03 DIAGNOSIS — I25111 Atherosclerotic heart disease of native coronary artery with angina pectoris with documented spasm: Secondary | ICD-10-CM | POA: Diagnosis not present

## 2018-06-03 DIAGNOSIS — R0789 Other chest pain: Secondary | ICD-10-CM | POA: Diagnosis not present

## 2018-06-03 DIAGNOSIS — R570 Cardiogenic shock: Secondary | ICD-10-CM | POA: Diagnosis not present

## 2018-06-03 DIAGNOSIS — Z4682 Encounter for fitting and adjustment of non-vascular catheter: Secondary | ICD-10-CM | POA: Diagnosis not present

## 2018-06-03 DIAGNOSIS — J9600 Acute respiratory failure, unspecified whether with hypoxia or hypercapnia: Secondary | ICD-10-CM | POA: Diagnosis not present

## 2018-06-03 DIAGNOSIS — Z452 Encounter for adjustment and management of vascular access device: Secondary | ICD-10-CM | POA: Diagnosis not present

## 2018-06-03 DIAGNOSIS — Z794 Long term (current) use of insulin: Secondary | ICD-10-CM | POA: Diagnosis not present

## 2018-06-03 DIAGNOSIS — I5033 Acute on chronic diastolic (congestive) heart failure: Secondary | ICD-10-CM | POA: Diagnosis not present

## 2018-06-03 DIAGNOSIS — N2889 Other specified disorders of kidney and ureter: Secondary | ICD-10-CM | POA: Diagnosis not present

## 2018-06-03 DIAGNOSIS — R609 Edema, unspecified: Secondary | ICD-10-CM | POA: Diagnosis not present

## 2018-06-03 DIAGNOSIS — Z992 Dependence on renal dialysis: Secondary | ICD-10-CM | POA: Diagnosis not present

## 2018-06-12 DIAGNOSIS — Z743 Need for continuous supervision: Secondary | ICD-10-CM | POA: Diagnosis not present

## 2018-06-12 DIAGNOSIS — D649 Anemia, unspecified: Secondary | ICD-10-CM | POA: Diagnosis not present

## 2018-06-12 DIAGNOSIS — M6281 Muscle weakness (generalized): Secondary | ICD-10-CM | POA: Diagnosis not present

## 2018-06-12 DIAGNOSIS — J811 Chronic pulmonary edema: Secondary | ICD-10-CM | POA: Diagnosis not present

## 2018-06-12 DIAGNOSIS — R2681 Unsteadiness on feet: Secondary | ICD-10-CM | POA: Diagnosis not present

## 2018-06-12 DIAGNOSIS — R279 Unspecified lack of coordination: Secondary | ICD-10-CM | POA: Diagnosis not present

## 2018-06-12 DIAGNOSIS — I132 Hypertensive heart and chronic kidney disease with heart failure and with stage 5 chronic kidney disease, or end stage renal disease: Secondary | ICD-10-CM | POA: Diagnosis not present

## 2018-06-12 DIAGNOSIS — N179 Acute kidney failure, unspecified: Secondary | ICD-10-CM | POA: Diagnosis not present

## 2018-06-12 DIAGNOSIS — E119 Type 2 diabetes mellitus without complications: Secondary | ICD-10-CM | POA: Diagnosis not present

## 2018-06-12 DIAGNOSIS — Z7982 Long term (current) use of aspirin: Secondary | ICD-10-CM | POA: Diagnosis not present

## 2018-06-12 DIAGNOSIS — J9601 Acute respiratory failure with hypoxia: Secondary | ICD-10-CM | POA: Diagnosis not present

## 2018-06-12 DIAGNOSIS — I120 Hypertensive chronic kidney disease with stage 5 chronic kidney disease or end stage renal disease: Secondary | ICD-10-CM | POA: Diagnosis not present

## 2018-06-12 DIAGNOSIS — E1169 Type 2 diabetes mellitus with other specified complication: Secondary | ICD-10-CM | POA: Diagnosis not present

## 2018-06-12 DIAGNOSIS — M109 Gout, unspecified: Secondary | ICD-10-CM | POA: Diagnosis not present

## 2018-06-12 DIAGNOSIS — I214 Non-ST elevation (NSTEMI) myocardial infarction: Secondary | ICD-10-CM | POA: Diagnosis not present

## 2018-06-12 DIAGNOSIS — R278 Other lack of coordination: Secondary | ICD-10-CM | POA: Diagnosis not present

## 2018-06-12 DIAGNOSIS — N186 End stage renal disease: Secondary | ICD-10-CM | POA: Diagnosis not present

## 2018-06-12 DIAGNOSIS — Z7902 Long term (current) use of antithrombotics/antiplatelets: Secondary | ICD-10-CM | POA: Diagnosis not present

## 2018-06-12 DIAGNOSIS — R531 Weakness: Secondary | ICD-10-CM | POA: Diagnosis not present

## 2018-06-12 DIAGNOSIS — Z992 Dependence on renal dialysis: Secondary | ICD-10-CM | POA: Diagnosis not present

## 2018-06-12 DIAGNOSIS — R54 Age-related physical debility: Secondary | ICD-10-CM | POA: Diagnosis not present

## 2018-06-12 DIAGNOSIS — Z79899 Other long term (current) drug therapy: Secondary | ICD-10-CM | POA: Diagnosis not present

## 2018-06-12 DIAGNOSIS — E1122 Type 2 diabetes mellitus with diabetic chronic kidney disease: Secondary | ICD-10-CM | POA: Diagnosis not present

## 2018-06-12 DIAGNOSIS — I5032 Chronic diastolic (congestive) heart failure: Secondary | ICD-10-CM | POA: Diagnosis not present

## 2018-06-12 DIAGNOSIS — Z794 Long term (current) use of insulin: Secondary | ICD-10-CM | POA: Diagnosis not present

## 2018-06-12 DIAGNOSIS — R41841 Cognitive communication deficit: Secondary | ICD-10-CM | POA: Diagnosis not present

## 2018-06-12 DIAGNOSIS — R5381 Other malaise: Secondary | ICD-10-CM | POA: Diagnosis not present

## 2018-06-12 DIAGNOSIS — R600 Localized edema: Secondary | ICD-10-CM | POA: Diagnosis not present

## 2018-06-12 DIAGNOSIS — I10 Essential (primary) hypertension: Secondary | ICD-10-CM | POA: Diagnosis not present

## 2018-06-12 DIAGNOSIS — L89152 Pressure ulcer of sacral region, stage 2: Secondary | ICD-10-CM | POA: Diagnosis not present

## 2018-06-12 DIAGNOSIS — R2689 Other abnormalities of gait and mobility: Secondary | ICD-10-CM | POA: Diagnosis not present

## 2018-06-12 DIAGNOSIS — J920 Pleural plaque with presence of asbestos: Secondary | ICD-10-CM | POA: Diagnosis not present

## 2018-06-16 DIAGNOSIS — Z114 Encounter for screening for human immunodeficiency virus [HIV]: Secondary | ICD-10-CM | POA: Diagnosis not present

## 2018-06-16 DIAGNOSIS — R2681 Unsteadiness on feet: Secondary | ICD-10-CM | POA: Diagnosis not present

## 2018-06-16 DIAGNOSIS — H409 Unspecified glaucoma: Secondary | ICD-10-CM | POA: Diagnosis not present

## 2018-06-16 DIAGNOSIS — N179 Acute kidney failure, unspecified: Secondary | ICD-10-CM | POA: Diagnosis not present

## 2018-06-16 DIAGNOSIS — E119 Type 2 diabetes mellitus without complications: Secondary | ICD-10-CM | POA: Diagnosis not present

## 2018-06-16 DIAGNOSIS — E785 Hyperlipidemia, unspecified: Secondary | ICD-10-CM | POA: Diagnosis not present

## 2018-06-16 DIAGNOSIS — E0821 Diabetes mellitus due to underlying condition with diabetic nephropathy: Secondary | ICD-10-CM | POA: Diagnosis not present

## 2018-06-16 DIAGNOSIS — M1990 Unspecified osteoarthritis, unspecified site: Secondary | ICD-10-CM | POA: Diagnosis not present

## 2018-06-16 DIAGNOSIS — I509 Heart failure, unspecified: Secondary | ICD-10-CM | POA: Diagnosis not present

## 2018-06-16 DIAGNOSIS — Z743 Need for continuous supervision: Secondary | ICD-10-CM | POA: Diagnosis not present

## 2018-06-16 DIAGNOSIS — N186 End stage renal disease: Secondary | ICD-10-CM | POA: Diagnosis not present

## 2018-06-16 DIAGNOSIS — Z992 Dependence on renal dialysis: Secondary | ICD-10-CM | POA: Diagnosis not present

## 2018-06-16 DIAGNOSIS — Z7982 Long term (current) use of aspirin: Secondary | ICD-10-CM | POA: Diagnosis not present

## 2018-06-16 DIAGNOSIS — Z48812 Encounter for surgical aftercare following surgery on the circulatory system: Secondary | ICD-10-CM | POA: Diagnosis not present

## 2018-06-16 DIAGNOSIS — E1169 Type 2 diabetes mellitus with other specified complication: Secondary | ICD-10-CM | POA: Diagnosis not present

## 2018-06-16 DIAGNOSIS — R54 Age-related physical debility: Secondary | ICD-10-CM | POA: Diagnosis not present

## 2018-06-16 DIAGNOSIS — I132 Hypertensive heart and chronic kidney disease with heart failure and with stage 5 chronic kidney disease, or end stage renal disease: Secondary | ICD-10-CM | POA: Diagnosis not present

## 2018-06-16 DIAGNOSIS — Z79899 Other long term (current) drug therapy: Secondary | ICD-10-CM | POA: Diagnosis not present

## 2018-06-16 DIAGNOSIS — R278 Other lack of coordination: Secondary | ICD-10-CM | POA: Diagnosis not present

## 2018-06-16 DIAGNOSIS — Z951 Presence of aortocoronary bypass graft: Secondary | ICD-10-CM | POA: Diagnosis not present

## 2018-06-16 DIAGNOSIS — E1122 Type 2 diabetes mellitus with diabetic chronic kidney disease: Secondary | ICD-10-CM | POA: Diagnosis not present

## 2018-06-16 DIAGNOSIS — R2689 Other abnormalities of gait and mobility: Secondary | ICD-10-CM | POA: Diagnosis not present

## 2018-06-16 DIAGNOSIS — R040 Epistaxis: Secondary | ICD-10-CM | POA: Diagnosis not present

## 2018-06-16 DIAGNOSIS — R5381 Other malaise: Secondary | ICD-10-CM | POA: Diagnosis not present

## 2018-06-16 DIAGNOSIS — R279 Unspecified lack of coordination: Secondary | ICD-10-CM | POA: Diagnosis not present

## 2018-06-16 DIAGNOSIS — D649 Anemia, unspecified: Secondary | ICD-10-CM | POA: Diagnosis not present

## 2018-06-16 DIAGNOSIS — Z794 Long term (current) use of insulin: Secondary | ICD-10-CM | POA: Diagnosis not present

## 2018-06-16 DIAGNOSIS — I120 Hypertensive chronic kidney disease with stage 5 chronic kidney disease or end stage renal disease: Secondary | ICD-10-CM | POA: Diagnosis not present

## 2018-06-16 DIAGNOSIS — R41841 Cognitive communication deficit: Secondary | ICD-10-CM | POA: Diagnosis not present

## 2018-06-16 DIAGNOSIS — J45998 Other asthma: Secondary | ICD-10-CM | POA: Diagnosis not present

## 2018-06-16 DIAGNOSIS — E1129 Type 2 diabetes mellitus with other diabetic kidney complication: Secondary | ICD-10-CM | POA: Diagnosis not present

## 2018-06-16 DIAGNOSIS — E1139 Type 2 diabetes mellitus with other diabetic ophthalmic complication: Secondary | ICD-10-CM | POA: Diagnosis not present

## 2018-06-16 DIAGNOSIS — Z418 Encounter for other procedures for purposes other than remedying health state: Secondary | ICD-10-CM | POA: Diagnosis not present

## 2018-06-16 DIAGNOSIS — M109 Gout, unspecified: Secondary | ICD-10-CM | POA: Diagnosis not present

## 2018-06-16 DIAGNOSIS — I214 Non-ST elevation (NSTEMI) myocardial infarction: Secondary | ICD-10-CM | POA: Diagnosis not present

## 2018-06-16 DIAGNOSIS — I5033 Acute on chronic diastolic (congestive) heart failure: Secondary | ICD-10-CM | POA: Diagnosis not present

## 2018-06-16 DIAGNOSIS — M6281 Muscle weakness (generalized): Secondary | ICD-10-CM | POA: Diagnosis not present

## 2018-06-16 DIAGNOSIS — I10 Essential (primary) hypertension: Secondary | ICD-10-CM | POA: Diagnosis not present

## 2018-06-16 DIAGNOSIS — R0602 Shortness of breath: Secondary | ICD-10-CM | POA: Diagnosis not present

## 2018-06-16 DIAGNOSIS — J9601 Acute respiratory failure with hypoxia: Secondary | ICD-10-CM | POA: Diagnosis not present

## 2018-06-16 DIAGNOSIS — L89152 Pressure ulcer of sacral region, stage 2: Secondary | ICD-10-CM | POA: Diagnosis not present

## 2018-06-16 DIAGNOSIS — H16143 Punctate keratitis, bilateral: Secondary | ICD-10-CM | POA: Diagnosis not present

## 2018-06-16 DIAGNOSIS — R531 Weakness: Secondary | ICD-10-CM | POA: Diagnosis not present

## 2018-06-16 DIAGNOSIS — L89313 Pressure ulcer of right buttock, stage 3: Secondary | ICD-10-CM | POA: Diagnosis not present

## 2018-06-16 DIAGNOSIS — D631 Anemia in chronic kidney disease: Secondary | ICD-10-CM | POA: Diagnosis not present

## 2018-06-16 DIAGNOSIS — I5032 Chronic diastolic (congestive) heart failure: Secondary | ICD-10-CM | POA: Diagnosis not present

## 2018-06-16 DIAGNOSIS — Z7902 Long term (current) use of antithrombotics/antiplatelets: Secondary | ICD-10-CM | POA: Diagnosis not present

## 2018-06-16 DIAGNOSIS — N2581 Secondary hyperparathyroidism of renal origin: Secondary | ICD-10-CM | POA: Diagnosis not present

## 2018-06-17 DIAGNOSIS — N2581 Secondary hyperparathyroidism of renal origin: Secondary | ICD-10-CM | POA: Diagnosis not present

## 2018-06-17 DIAGNOSIS — Z794 Long term (current) use of insulin: Secondary | ICD-10-CM | POA: Diagnosis not present

## 2018-06-17 DIAGNOSIS — Z418 Encounter for other procedures for purposes other than remedying health state: Secondary | ICD-10-CM | POA: Diagnosis not present

## 2018-06-17 DIAGNOSIS — D649 Anemia, unspecified: Secondary | ICD-10-CM | POA: Diagnosis not present

## 2018-06-17 DIAGNOSIS — E0821 Diabetes mellitus due to underlying condition with diabetic nephropathy: Secondary | ICD-10-CM | POA: Diagnosis not present

## 2018-06-17 DIAGNOSIS — E785 Hyperlipidemia, unspecified: Secondary | ICD-10-CM | POA: Diagnosis not present

## 2018-06-17 DIAGNOSIS — D631 Anemia in chronic kidney disease: Secondary | ICD-10-CM | POA: Diagnosis not present

## 2018-06-17 DIAGNOSIS — N186 End stage renal disease: Secondary | ICD-10-CM | POA: Diagnosis not present

## 2018-06-18 DIAGNOSIS — N186 End stage renal disease: Secondary | ICD-10-CM | POA: Diagnosis not present

## 2018-06-18 DIAGNOSIS — I214 Non-ST elevation (NSTEMI) myocardial infarction: Secondary | ICD-10-CM | POA: Diagnosis not present

## 2018-06-18 DIAGNOSIS — M1990 Unspecified osteoarthritis, unspecified site: Secondary | ICD-10-CM | POA: Diagnosis not present

## 2018-06-18 DIAGNOSIS — L89313 Pressure ulcer of right buttock, stage 3: Secondary | ICD-10-CM | POA: Diagnosis not present

## 2018-06-19 DIAGNOSIS — Z418 Encounter for other procedures for purposes other than remedying health state: Secondary | ICD-10-CM | POA: Diagnosis not present

## 2018-06-19 DIAGNOSIS — N2581 Secondary hyperparathyroidism of renal origin: Secondary | ICD-10-CM | POA: Diagnosis not present

## 2018-06-19 DIAGNOSIS — D631 Anemia in chronic kidney disease: Secondary | ICD-10-CM | POA: Diagnosis not present

## 2018-06-19 DIAGNOSIS — E785 Hyperlipidemia, unspecified: Secondary | ICD-10-CM | POA: Diagnosis not present

## 2018-06-19 DIAGNOSIS — N186 End stage renal disease: Secondary | ICD-10-CM | POA: Diagnosis not present

## 2018-06-21 DIAGNOSIS — H16143 Punctate keratitis, bilateral: Secondary | ICD-10-CM | POA: Diagnosis not present

## 2018-06-21 DIAGNOSIS — L89313 Pressure ulcer of right buttock, stage 3: Secondary | ICD-10-CM | POA: Diagnosis not present

## 2018-06-21 DIAGNOSIS — H409 Unspecified glaucoma: Secondary | ICD-10-CM | POA: Diagnosis not present

## 2018-06-22 DIAGNOSIS — Z418 Encounter for other procedures for purposes other than remedying health state: Secondary | ICD-10-CM | POA: Diagnosis not present

## 2018-06-22 DIAGNOSIS — N186 End stage renal disease: Secondary | ICD-10-CM | POA: Diagnosis not present

## 2018-06-22 DIAGNOSIS — D631 Anemia in chronic kidney disease: Secondary | ICD-10-CM | POA: Diagnosis not present

## 2018-06-22 DIAGNOSIS — E785 Hyperlipidemia, unspecified: Secondary | ICD-10-CM | POA: Diagnosis not present

## 2018-06-22 DIAGNOSIS — N2581 Secondary hyperparathyroidism of renal origin: Secondary | ICD-10-CM | POA: Diagnosis not present

## 2018-06-23 DIAGNOSIS — R040 Epistaxis: Secondary | ICD-10-CM | POA: Diagnosis not present

## 2018-06-23 DIAGNOSIS — E0821 Diabetes mellitus due to underlying condition with diabetic nephropathy: Secondary | ICD-10-CM | POA: Diagnosis not present

## 2018-06-23 DIAGNOSIS — M1990 Unspecified osteoarthritis, unspecified site: Secondary | ICD-10-CM | POA: Diagnosis not present

## 2018-06-24 DIAGNOSIS — E785 Hyperlipidemia, unspecified: Secondary | ICD-10-CM | POA: Diagnosis not present

## 2018-06-24 DIAGNOSIS — N186 End stage renal disease: Secondary | ICD-10-CM | POA: Diagnosis not present

## 2018-06-24 DIAGNOSIS — N2581 Secondary hyperparathyroidism of renal origin: Secondary | ICD-10-CM | POA: Diagnosis not present

## 2018-06-24 DIAGNOSIS — D631 Anemia in chronic kidney disease: Secondary | ICD-10-CM | POA: Diagnosis not present

## 2018-06-24 DIAGNOSIS — Z418 Encounter for other procedures for purposes other than remedying health state: Secondary | ICD-10-CM | POA: Diagnosis not present

## 2018-06-25 DIAGNOSIS — I214 Non-ST elevation (NSTEMI) myocardial infarction: Secondary | ICD-10-CM | POA: Diagnosis not present

## 2018-06-25 DIAGNOSIS — L89313 Pressure ulcer of right buttock, stage 3: Secondary | ICD-10-CM | POA: Diagnosis not present

## 2018-06-25 DIAGNOSIS — M1990 Unspecified osteoarthritis, unspecified site: Secondary | ICD-10-CM | POA: Diagnosis not present

## 2018-06-25 DIAGNOSIS — N186 End stage renal disease: Secondary | ICD-10-CM | POA: Diagnosis not present

## 2018-06-26 DIAGNOSIS — E785 Hyperlipidemia, unspecified: Secondary | ICD-10-CM | POA: Diagnosis not present

## 2018-06-26 DIAGNOSIS — D631 Anemia in chronic kidney disease: Secondary | ICD-10-CM | POA: Diagnosis not present

## 2018-06-26 DIAGNOSIS — N2581 Secondary hyperparathyroidism of renal origin: Secondary | ICD-10-CM | POA: Diagnosis not present

## 2018-06-26 DIAGNOSIS — Z418 Encounter for other procedures for purposes other than remedying health state: Secondary | ICD-10-CM | POA: Diagnosis not present

## 2018-06-26 DIAGNOSIS — N186 End stage renal disease: Secondary | ICD-10-CM | POA: Diagnosis not present

## 2018-06-28 DIAGNOSIS — N186 End stage renal disease: Secondary | ICD-10-CM | POA: Diagnosis not present

## 2018-06-28 DIAGNOSIS — D631 Anemia in chronic kidney disease: Secondary | ICD-10-CM | POA: Diagnosis not present

## 2018-06-28 DIAGNOSIS — Z418 Encounter for other procedures for purposes other than remedying health state: Secondary | ICD-10-CM | POA: Diagnosis not present

## 2018-06-28 DIAGNOSIS — N2581 Secondary hyperparathyroidism of renal origin: Secondary | ICD-10-CM | POA: Diagnosis not present

## 2018-06-28 DIAGNOSIS — E785 Hyperlipidemia, unspecified: Secondary | ICD-10-CM | POA: Diagnosis not present

## 2018-07-01 DIAGNOSIS — N186 End stage renal disease: Secondary | ICD-10-CM | POA: Diagnosis not present

## 2018-07-01 DIAGNOSIS — Z418 Encounter for other procedures for purposes other than remedying health state: Secondary | ICD-10-CM | POA: Diagnosis not present

## 2018-07-01 DIAGNOSIS — E785 Hyperlipidemia, unspecified: Secondary | ICD-10-CM | POA: Diagnosis not present

## 2018-07-01 DIAGNOSIS — D631 Anemia in chronic kidney disease: Secondary | ICD-10-CM | POA: Diagnosis not present

## 2018-07-01 DIAGNOSIS — N2581 Secondary hyperparathyroidism of renal origin: Secondary | ICD-10-CM | POA: Diagnosis not present

## 2018-07-02 DIAGNOSIS — E1122 Type 2 diabetes mellitus with diabetic chronic kidney disease: Secondary | ICD-10-CM | POA: Diagnosis not present

## 2018-07-02 DIAGNOSIS — I214 Non-ST elevation (NSTEMI) myocardial infarction: Secondary | ICD-10-CM | POA: Diagnosis not present

## 2018-07-02 DIAGNOSIS — J45909 Unspecified asthma, uncomplicated: Secondary | ICD-10-CM | POA: Diagnosis not present

## 2018-07-02 DIAGNOSIS — M1000 Idiopathic gout, unspecified site: Secondary | ICD-10-CM | POA: Diagnosis not present

## 2018-07-02 DIAGNOSIS — N186 End stage renal disease: Secondary | ICD-10-CM | POA: Diagnosis not present

## 2018-07-02 DIAGNOSIS — I2510 Atherosclerotic heart disease of native coronary artery without angina pectoris: Secondary | ICD-10-CM | POA: Diagnosis not present

## 2018-07-02 DIAGNOSIS — R41841 Cognitive communication deficit: Secondary | ICD-10-CM | POA: Diagnosis not present

## 2018-07-02 DIAGNOSIS — I132 Hypertensive heart and chronic kidney disease with heart failure and with stage 5 chronic kidney disease, or end stage renal disease: Secondary | ICD-10-CM | POA: Diagnosis not present

## 2018-07-02 DIAGNOSIS — I509 Heart failure, unspecified: Secondary | ICD-10-CM | POA: Diagnosis not present

## 2018-07-03 DIAGNOSIS — N186 End stage renal disease: Secondary | ICD-10-CM | POA: Diagnosis not present

## 2018-07-03 DIAGNOSIS — D631 Anemia in chronic kidney disease: Secondary | ICD-10-CM | POA: Diagnosis not present

## 2018-07-03 DIAGNOSIS — N2581 Secondary hyperparathyroidism of renal origin: Secondary | ICD-10-CM | POA: Diagnosis not present

## 2018-07-03 DIAGNOSIS — E785 Hyperlipidemia, unspecified: Secondary | ICD-10-CM | POA: Diagnosis not present

## 2018-07-03 DIAGNOSIS — Z418 Encounter for other procedures for purposes other than remedying health state: Secondary | ICD-10-CM | POA: Diagnosis not present

## 2018-07-05 DIAGNOSIS — N2581 Secondary hyperparathyroidism of renal origin: Secondary | ICD-10-CM | POA: Diagnosis not present

## 2018-07-05 DIAGNOSIS — N186 End stage renal disease: Secondary | ICD-10-CM | POA: Diagnosis not present

## 2018-07-05 DIAGNOSIS — D631 Anemia in chronic kidney disease: Secondary | ICD-10-CM | POA: Diagnosis not present

## 2018-07-05 DIAGNOSIS — Z418 Encounter for other procedures for purposes other than remedying health state: Secondary | ICD-10-CM | POA: Diagnosis not present

## 2018-07-05 DIAGNOSIS — E785 Hyperlipidemia, unspecified: Secondary | ICD-10-CM | POA: Diagnosis not present

## 2018-07-05 NOTE — Telephone Encounter (Signed)
error 

## 2018-07-06 DIAGNOSIS — Z992 Dependence on renal dialysis: Secondary | ICD-10-CM | POA: Diagnosis not present

## 2018-07-06 DIAGNOSIS — N186 End stage renal disease: Secondary | ICD-10-CM | POA: Diagnosis not present

## 2018-07-08 DIAGNOSIS — N2581 Secondary hyperparathyroidism of renal origin: Secondary | ICD-10-CM | POA: Diagnosis not present

## 2018-07-08 DIAGNOSIS — E785 Hyperlipidemia, unspecified: Secondary | ICD-10-CM | POA: Diagnosis not present

## 2018-07-08 DIAGNOSIS — D631 Anemia in chronic kidney disease: Secondary | ICD-10-CM | POA: Diagnosis not present

## 2018-07-08 DIAGNOSIS — Z23 Encounter for immunization: Secondary | ICD-10-CM | POA: Diagnosis not present

## 2018-07-08 DIAGNOSIS — N186 End stage renal disease: Secondary | ICD-10-CM | POA: Diagnosis not present

## 2018-07-09 DIAGNOSIS — I2510 Atherosclerotic heart disease of native coronary artery without angina pectoris: Secondary | ICD-10-CM | POA: Diagnosis not present

## 2018-07-09 DIAGNOSIS — I214 Non-ST elevation (NSTEMI) myocardial infarction: Secondary | ICD-10-CM | POA: Diagnosis not present

## 2018-07-09 DIAGNOSIS — I132 Hypertensive heart and chronic kidney disease with heart failure and with stage 5 chronic kidney disease, or end stage renal disease: Secondary | ICD-10-CM | POA: Diagnosis not present

## 2018-07-09 DIAGNOSIS — I509 Heart failure, unspecified: Secondary | ICD-10-CM | POA: Diagnosis not present

## 2018-07-09 DIAGNOSIS — R41841 Cognitive communication deficit: Secondary | ICD-10-CM | POA: Diagnosis not present

## 2018-07-09 DIAGNOSIS — N186 End stage renal disease: Secondary | ICD-10-CM | POA: Diagnosis not present

## 2018-07-09 DIAGNOSIS — J45909 Unspecified asthma, uncomplicated: Secondary | ICD-10-CM | POA: Diagnosis not present

## 2018-07-09 DIAGNOSIS — E1122 Type 2 diabetes mellitus with diabetic chronic kidney disease: Secondary | ICD-10-CM | POA: Diagnosis not present

## 2018-07-09 DIAGNOSIS — M1000 Idiopathic gout, unspecified site: Secondary | ICD-10-CM | POA: Diagnosis not present

## 2018-07-10 DIAGNOSIS — D631 Anemia in chronic kidney disease: Secondary | ICD-10-CM | POA: Diagnosis not present

## 2018-07-10 DIAGNOSIS — N186 End stage renal disease: Secondary | ICD-10-CM | POA: Diagnosis not present

## 2018-07-10 DIAGNOSIS — N2581 Secondary hyperparathyroidism of renal origin: Secondary | ICD-10-CM | POA: Diagnosis not present

## 2018-07-10 DIAGNOSIS — Z23 Encounter for immunization: Secondary | ICD-10-CM | POA: Diagnosis not present

## 2018-07-10 DIAGNOSIS — E785 Hyperlipidemia, unspecified: Secondary | ICD-10-CM | POA: Diagnosis not present

## 2018-07-12 DIAGNOSIS — K254 Chronic or unspecified gastric ulcer with hemorrhage: Secondary | ICD-10-CM | POA: Diagnosis not present

## 2018-07-12 DIAGNOSIS — N183 Chronic kidney disease, stage 3 (moderate): Secondary | ICD-10-CM | POA: Diagnosis not present

## 2018-07-12 DIAGNOSIS — E559 Vitamin D deficiency, unspecified: Secondary | ICD-10-CM | POA: Diagnosis not present

## 2018-07-12 DIAGNOSIS — J45909 Unspecified asthma, uncomplicated: Secondary | ICD-10-CM | POA: Diagnosis not present

## 2018-07-12 DIAGNOSIS — I5032 Chronic diastolic (congestive) heart failure: Secondary | ICD-10-CM | POA: Diagnosis not present

## 2018-07-12 DIAGNOSIS — E1151 Type 2 diabetes mellitus with diabetic peripheral angiopathy without gangrene: Secondary | ICD-10-CM | POA: Diagnosis not present

## 2018-07-12 DIAGNOSIS — E782 Mixed hyperlipidemia: Secondary | ICD-10-CM | POA: Diagnosis not present

## 2018-07-12 DIAGNOSIS — I119 Hypertensive heart disease without heart failure: Secondary | ICD-10-CM | POA: Diagnosis not present

## 2018-07-12 DIAGNOSIS — I2510 Atherosclerotic heart disease of native coronary artery without angina pectoris: Secondary | ICD-10-CM | POA: Diagnosis not present

## 2018-07-13 DIAGNOSIS — E785 Hyperlipidemia, unspecified: Secondary | ICD-10-CM | POA: Diagnosis not present

## 2018-07-13 DIAGNOSIS — D631 Anemia in chronic kidney disease: Secondary | ICD-10-CM | POA: Diagnosis not present

## 2018-07-13 DIAGNOSIS — N186 End stage renal disease: Secondary | ICD-10-CM | POA: Diagnosis not present

## 2018-07-13 DIAGNOSIS — N2581 Secondary hyperparathyroidism of renal origin: Secondary | ICD-10-CM | POA: Diagnosis not present

## 2018-07-13 DIAGNOSIS — Z23 Encounter for immunization: Secondary | ICD-10-CM | POA: Diagnosis not present

## 2018-07-14 DIAGNOSIS — E1122 Type 2 diabetes mellitus with diabetic chronic kidney disease: Secondary | ICD-10-CM | POA: Diagnosis not present

## 2018-07-14 DIAGNOSIS — M1000 Idiopathic gout, unspecified site: Secondary | ICD-10-CM | POA: Diagnosis not present

## 2018-07-14 DIAGNOSIS — J45909 Unspecified asthma, uncomplicated: Secondary | ICD-10-CM | POA: Diagnosis not present

## 2018-07-14 DIAGNOSIS — I132 Hypertensive heart and chronic kidney disease with heart failure and with stage 5 chronic kidney disease, or end stage renal disease: Secondary | ICD-10-CM | POA: Diagnosis not present

## 2018-07-14 DIAGNOSIS — R41841 Cognitive communication deficit: Secondary | ICD-10-CM | POA: Diagnosis not present

## 2018-07-14 DIAGNOSIS — I214 Non-ST elevation (NSTEMI) myocardial infarction: Secondary | ICD-10-CM | POA: Diagnosis not present

## 2018-07-14 DIAGNOSIS — I509 Heart failure, unspecified: Secondary | ICD-10-CM | POA: Diagnosis not present

## 2018-07-14 DIAGNOSIS — I2510 Atherosclerotic heart disease of native coronary artery without angina pectoris: Secondary | ICD-10-CM | POA: Diagnosis not present

## 2018-07-14 DIAGNOSIS — N186 End stage renal disease: Secondary | ICD-10-CM | POA: Diagnosis not present

## 2018-07-15 DIAGNOSIS — N2581 Secondary hyperparathyroidism of renal origin: Secondary | ICD-10-CM | POA: Diagnosis not present

## 2018-07-15 DIAGNOSIS — D631 Anemia in chronic kidney disease: Secondary | ICD-10-CM | POA: Diagnosis not present

## 2018-07-15 DIAGNOSIS — N186 End stage renal disease: Secondary | ICD-10-CM | POA: Diagnosis not present

## 2018-07-15 DIAGNOSIS — E785 Hyperlipidemia, unspecified: Secondary | ICD-10-CM | POA: Diagnosis not present

## 2018-07-15 DIAGNOSIS — E119 Type 2 diabetes mellitus without complications: Secondary | ICD-10-CM | POA: Diagnosis not present

## 2018-07-15 DIAGNOSIS — Z23 Encounter for immunization: Secondary | ICD-10-CM | POA: Diagnosis not present

## 2018-07-15 DIAGNOSIS — M109 Gout, unspecified: Secondary | ICD-10-CM | POA: Diagnosis not present

## 2018-07-16 DIAGNOSIS — M1000 Idiopathic gout, unspecified site: Secondary | ICD-10-CM | POA: Diagnosis not present

## 2018-07-16 DIAGNOSIS — I132 Hypertensive heart and chronic kidney disease with heart failure and with stage 5 chronic kidney disease, or end stage renal disease: Secondary | ICD-10-CM | POA: Diagnosis not present

## 2018-07-16 DIAGNOSIS — N186 End stage renal disease: Secondary | ICD-10-CM | POA: Diagnosis not present

## 2018-07-16 DIAGNOSIS — E1122 Type 2 diabetes mellitus with diabetic chronic kidney disease: Secondary | ICD-10-CM | POA: Diagnosis not present

## 2018-07-16 DIAGNOSIS — J45909 Unspecified asthma, uncomplicated: Secondary | ICD-10-CM | POA: Diagnosis not present

## 2018-07-16 DIAGNOSIS — R41841 Cognitive communication deficit: Secondary | ICD-10-CM | POA: Diagnosis not present

## 2018-07-16 DIAGNOSIS — I2510 Atherosclerotic heart disease of native coronary artery without angina pectoris: Secondary | ICD-10-CM | POA: Diagnosis not present

## 2018-07-16 DIAGNOSIS — I214 Non-ST elevation (NSTEMI) myocardial infarction: Secondary | ICD-10-CM | POA: Diagnosis not present

## 2018-07-16 DIAGNOSIS — I509 Heart failure, unspecified: Secondary | ICD-10-CM | POA: Diagnosis not present

## 2018-07-17 DIAGNOSIS — N186 End stage renal disease: Secondary | ICD-10-CM | POA: Diagnosis not present

## 2018-07-17 DIAGNOSIS — D631 Anemia in chronic kidney disease: Secondary | ICD-10-CM | POA: Diagnosis not present

## 2018-07-17 DIAGNOSIS — D509 Iron deficiency anemia, unspecified: Secondary | ICD-10-CM | POA: Diagnosis not present

## 2018-07-19 DIAGNOSIS — N186 End stage renal disease: Secondary | ICD-10-CM | POA: Diagnosis not present

## 2018-07-19 DIAGNOSIS — J45909 Unspecified asthma, uncomplicated: Secondary | ICD-10-CM | POA: Diagnosis not present

## 2018-07-19 DIAGNOSIS — E1122 Type 2 diabetes mellitus with diabetic chronic kidney disease: Secondary | ICD-10-CM | POA: Diagnosis not present

## 2018-07-19 DIAGNOSIS — I214 Non-ST elevation (NSTEMI) myocardial infarction: Secondary | ICD-10-CM | POA: Diagnosis not present

## 2018-07-19 DIAGNOSIS — I2510 Atherosclerotic heart disease of native coronary artery without angina pectoris: Secondary | ICD-10-CM | POA: Diagnosis not present

## 2018-07-19 DIAGNOSIS — I509 Heart failure, unspecified: Secondary | ICD-10-CM | POA: Diagnosis not present

## 2018-07-19 DIAGNOSIS — I132 Hypertensive heart and chronic kidney disease with heart failure and with stage 5 chronic kidney disease, or end stage renal disease: Secondary | ICD-10-CM | POA: Diagnosis not present

## 2018-07-19 DIAGNOSIS — R41841 Cognitive communication deficit: Secondary | ICD-10-CM | POA: Diagnosis not present

## 2018-07-19 DIAGNOSIS — M1000 Idiopathic gout, unspecified site: Secondary | ICD-10-CM | POA: Diagnosis not present

## 2018-07-20 DIAGNOSIS — D631 Anemia in chronic kidney disease: Secondary | ICD-10-CM | POA: Diagnosis not present

## 2018-07-20 DIAGNOSIS — N186 End stage renal disease: Secondary | ICD-10-CM | POA: Diagnosis not present

## 2018-07-20 DIAGNOSIS — D509 Iron deficiency anemia, unspecified: Secondary | ICD-10-CM | POA: Diagnosis not present

## 2018-07-21 ENCOUNTER — Other Ambulatory Visit: Payer: Self-pay | Admitting: *Deleted

## 2018-07-21 DIAGNOSIS — I2510 Atherosclerotic heart disease of native coronary artery without angina pectoris: Secondary | ICD-10-CM | POA: Diagnosis not present

## 2018-07-21 DIAGNOSIS — I509 Heart failure, unspecified: Secondary | ICD-10-CM | POA: Diagnosis not present

## 2018-07-21 DIAGNOSIS — R41841 Cognitive communication deficit: Secondary | ICD-10-CM | POA: Diagnosis not present

## 2018-07-21 DIAGNOSIS — I132 Hypertensive heart and chronic kidney disease with heart failure and with stage 5 chronic kidney disease, or end stage renal disease: Secondary | ICD-10-CM | POA: Diagnosis not present

## 2018-07-21 DIAGNOSIS — E1122 Type 2 diabetes mellitus with diabetic chronic kidney disease: Secondary | ICD-10-CM | POA: Diagnosis not present

## 2018-07-21 DIAGNOSIS — N186 End stage renal disease: Secondary | ICD-10-CM | POA: Diagnosis not present

## 2018-07-21 DIAGNOSIS — M1000 Idiopathic gout, unspecified site: Secondary | ICD-10-CM | POA: Diagnosis not present

## 2018-07-21 DIAGNOSIS — J45909 Unspecified asthma, uncomplicated: Secondary | ICD-10-CM | POA: Diagnosis not present

## 2018-07-21 DIAGNOSIS — I214 Non-ST elevation (NSTEMI) myocardial infarction: Secondary | ICD-10-CM | POA: Diagnosis not present

## 2018-07-21 MED ORDER — NITROGLYCERIN 0.4 MG SL SUBL: SUBLINGUAL_TABLET | SUBLINGUAL | 5 refills | Status: DC

## 2018-07-22 DIAGNOSIS — D631 Anemia in chronic kidney disease: Secondary | ICD-10-CM | POA: Diagnosis not present

## 2018-07-22 DIAGNOSIS — D509 Iron deficiency anemia, unspecified: Secondary | ICD-10-CM | POA: Diagnosis not present

## 2018-07-22 DIAGNOSIS — N186 End stage renal disease: Secondary | ICD-10-CM | POA: Diagnosis not present

## 2018-07-23 DIAGNOSIS — Z01818 Encounter for other preprocedural examination: Secondary | ICD-10-CM | POA: Diagnosis not present

## 2018-07-24 DIAGNOSIS — D631 Anemia in chronic kidney disease: Secondary | ICD-10-CM | POA: Diagnosis not present

## 2018-07-24 DIAGNOSIS — N186 End stage renal disease: Secondary | ICD-10-CM | POA: Diagnosis not present

## 2018-07-24 DIAGNOSIS — D509 Iron deficiency anemia, unspecified: Secondary | ICD-10-CM | POA: Diagnosis not present

## 2018-07-26 DIAGNOSIS — D509 Iron deficiency anemia, unspecified: Secondary | ICD-10-CM | POA: Diagnosis not present

## 2018-07-26 DIAGNOSIS — D631 Anemia in chronic kidney disease: Secondary | ICD-10-CM | POA: Diagnosis not present

## 2018-07-26 DIAGNOSIS — N186 End stage renal disease: Secondary | ICD-10-CM | POA: Diagnosis not present

## 2018-07-27 DIAGNOSIS — I132 Hypertensive heart and chronic kidney disease with heart failure and with stage 5 chronic kidney disease, or end stage renal disease: Secondary | ICD-10-CM | POA: Diagnosis not present

## 2018-07-27 DIAGNOSIS — I2510 Atherosclerotic heart disease of native coronary artery without angina pectoris: Secondary | ICD-10-CM | POA: Diagnosis not present

## 2018-07-27 DIAGNOSIS — I252 Old myocardial infarction: Secondary | ICD-10-CM | POA: Diagnosis not present

## 2018-07-27 DIAGNOSIS — Z992 Dependence on renal dialysis: Secondary | ICD-10-CM | POA: Diagnosis not present

## 2018-07-27 DIAGNOSIS — E1122 Type 2 diabetes mellitus with diabetic chronic kidney disease: Secondary | ICD-10-CM | POA: Diagnosis not present

## 2018-07-27 DIAGNOSIS — Z7902 Long term (current) use of antithrombotics/antiplatelets: Secondary | ICD-10-CM | POA: Diagnosis not present

## 2018-07-27 DIAGNOSIS — Z794 Long term (current) use of insulin: Secondary | ICD-10-CM | POA: Diagnosis not present

## 2018-07-27 DIAGNOSIS — Z955 Presence of coronary angioplasty implant and graft: Secondary | ICD-10-CM | POA: Diagnosis not present

## 2018-07-27 DIAGNOSIS — N186 End stage renal disease: Secondary | ICD-10-CM | POA: Diagnosis not present

## 2018-07-28 DIAGNOSIS — I132 Hypertensive heart and chronic kidney disease with heart failure and with stage 5 chronic kidney disease, or end stage renal disease: Secondary | ICD-10-CM | POA: Diagnosis not present

## 2018-07-28 DIAGNOSIS — E1122 Type 2 diabetes mellitus with diabetic chronic kidney disease: Secondary | ICD-10-CM | POA: Diagnosis not present

## 2018-07-28 DIAGNOSIS — R41841 Cognitive communication deficit: Secondary | ICD-10-CM | POA: Diagnosis not present

## 2018-07-28 DIAGNOSIS — I214 Non-ST elevation (NSTEMI) myocardial infarction: Secondary | ICD-10-CM | POA: Diagnosis not present

## 2018-07-28 DIAGNOSIS — I2510 Atherosclerotic heart disease of native coronary artery without angina pectoris: Secondary | ICD-10-CM | POA: Diagnosis not present

## 2018-07-28 DIAGNOSIS — I509 Heart failure, unspecified: Secondary | ICD-10-CM | POA: Diagnosis not present

## 2018-07-28 DIAGNOSIS — J45909 Unspecified asthma, uncomplicated: Secondary | ICD-10-CM | POA: Diagnosis not present

## 2018-07-28 DIAGNOSIS — M1000 Idiopathic gout, unspecified site: Secondary | ICD-10-CM | POA: Diagnosis not present

## 2018-07-28 DIAGNOSIS — N186 End stage renal disease: Secondary | ICD-10-CM | POA: Diagnosis not present

## 2018-07-29 DIAGNOSIS — D509 Iron deficiency anemia, unspecified: Secondary | ICD-10-CM | POA: Diagnosis not present

## 2018-07-29 DIAGNOSIS — D631 Anemia in chronic kidney disease: Secondary | ICD-10-CM | POA: Diagnosis not present

## 2018-07-29 DIAGNOSIS — Z1159 Encounter for screening for other viral diseases: Secondary | ICD-10-CM | POA: Diagnosis not present

## 2018-07-29 DIAGNOSIS — N186 End stage renal disease: Secondary | ICD-10-CM | POA: Diagnosis not present

## 2018-07-31 DIAGNOSIS — D631 Anemia in chronic kidney disease: Secondary | ICD-10-CM | POA: Diagnosis not present

## 2018-07-31 DIAGNOSIS — D509 Iron deficiency anemia, unspecified: Secondary | ICD-10-CM | POA: Diagnosis not present

## 2018-07-31 DIAGNOSIS — N186 End stage renal disease: Secondary | ICD-10-CM | POA: Diagnosis not present

## 2018-08-02 ENCOUNTER — Ambulatory Visit: Payer: Self-pay | Admitting: Physician Assistant

## 2018-08-02 DIAGNOSIS — I2510 Atherosclerotic heart disease of native coronary artery without angina pectoris: Secondary | ICD-10-CM | POA: Diagnosis not present

## 2018-08-02 DIAGNOSIS — N186 End stage renal disease: Secondary | ICD-10-CM | POA: Diagnosis not present

## 2018-08-02 DIAGNOSIS — Z992 Dependence on renal dialysis: Secondary | ICD-10-CM | POA: Diagnosis not present

## 2018-08-02 DIAGNOSIS — E782 Mixed hyperlipidemia: Secondary | ICD-10-CM | POA: Diagnosis not present

## 2018-08-02 DIAGNOSIS — M1A49X0 Other secondary chronic gout, multiple sites, without tophus (tophi): Secondary | ICD-10-CM | POA: Diagnosis not present

## 2018-08-02 DIAGNOSIS — I119 Hypertensive heart disease without heart failure: Secondary | ICD-10-CM | POA: Diagnosis not present

## 2018-08-02 DIAGNOSIS — Z794 Long term (current) use of insulin: Secondary | ICD-10-CM | POA: Diagnosis not present

## 2018-08-02 DIAGNOSIS — E1151 Type 2 diabetes mellitus with diabetic peripheral angiopathy without gangrene: Secondary | ICD-10-CM | POA: Diagnosis not present

## 2018-08-02 DIAGNOSIS — Z0001 Encounter for general adult medical examination with abnormal findings: Secondary | ICD-10-CM | POA: Diagnosis not present

## 2018-08-03 DIAGNOSIS — D509 Iron deficiency anemia, unspecified: Secondary | ICD-10-CM | POA: Diagnosis not present

## 2018-08-03 DIAGNOSIS — D631 Anemia in chronic kidney disease: Secondary | ICD-10-CM | POA: Diagnosis not present

## 2018-08-03 DIAGNOSIS — N186 End stage renal disease: Secondary | ICD-10-CM | POA: Diagnosis not present

## 2018-08-05 DIAGNOSIS — N186 End stage renal disease: Secondary | ICD-10-CM | POA: Diagnosis not present

## 2018-08-05 DIAGNOSIS — D631 Anemia in chronic kidney disease: Secondary | ICD-10-CM | POA: Diagnosis not present

## 2018-08-05 DIAGNOSIS — D509 Iron deficiency anemia, unspecified: Secondary | ICD-10-CM | POA: Diagnosis not present

## 2018-08-06 DIAGNOSIS — I2510 Atherosclerotic heart disease of native coronary artery without angina pectoris: Secondary | ICD-10-CM | POA: Diagnosis not present

## 2018-08-06 DIAGNOSIS — E1151 Type 2 diabetes mellitus with diabetic peripheral angiopathy without gangrene: Secondary | ICD-10-CM | POA: Diagnosis not present

## 2018-08-06 DIAGNOSIS — M1A49X0 Other secondary chronic gout, multiple sites, without tophus (tophi): Secondary | ICD-10-CM | POA: Diagnosis not present

## 2018-08-06 DIAGNOSIS — R42 Dizziness and giddiness: Secondary | ICD-10-CM | POA: Diagnosis not present

## 2018-08-06 DIAGNOSIS — Z794 Long term (current) use of insulin: Secondary | ICD-10-CM | POA: Diagnosis not present

## 2018-08-06 DIAGNOSIS — E782 Mixed hyperlipidemia: Secondary | ICD-10-CM | POA: Diagnosis not present

## 2018-08-06 DIAGNOSIS — N186 End stage renal disease: Secondary | ICD-10-CM | POA: Diagnosis not present

## 2018-08-06 DIAGNOSIS — Z992 Dependence on renal dialysis: Secondary | ICD-10-CM | POA: Diagnosis not present

## 2018-08-06 DIAGNOSIS — I119 Hypertensive heart disease without heart failure: Secondary | ICD-10-CM | POA: Diagnosis not present

## 2018-08-07 DIAGNOSIS — D631 Anemia in chronic kidney disease: Secondary | ICD-10-CM | POA: Diagnosis not present

## 2018-08-07 DIAGNOSIS — N186 End stage renal disease: Secondary | ICD-10-CM | POA: Diagnosis not present

## 2018-08-07 DIAGNOSIS — N2581 Secondary hyperparathyroidism of renal origin: Secondary | ICD-10-CM | POA: Diagnosis not present

## 2018-08-07 DIAGNOSIS — D509 Iron deficiency anemia, unspecified: Secondary | ICD-10-CM | POA: Diagnosis not present

## 2018-08-10 DIAGNOSIS — N186 End stage renal disease: Secondary | ICD-10-CM | POA: Diagnosis not present

## 2018-08-10 DIAGNOSIS — N2581 Secondary hyperparathyroidism of renal origin: Secondary | ICD-10-CM | POA: Diagnosis not present

## 2018-08-10 DIAGNOSIS — D509 Iron deficiency anemia, unspecified: Secondary | ICD-10-CM | POA: Diagnosis not present

## 2018-08-10 DIAGNOSIS — D631 Anemia in chronic kidney disease: Secondary | ICD-10-CM | POA: Diagnosis not present

## 2018-08-11 ENCOUNTER — Telehealth: Payer: Self-pay | Admitting: Physician Assistant

## 2018-08-11 ENCOUNTER — Encounter: Payer: Self-pay | Admitting: Physician Assistant

## 2018-08-11 ENCOUNTER — Ambulatory Visit: Payer: Medicare HMO | Admitting: Physician Assistant

## 2018-08-11 VITALS — BP 110/58 | HR 72 | Ht 62.0 in | Wt 163.4 lb

## 2018-08-11 DIAGNOSIS — N186 End stage renal disease: Secondary | ICD-10-CM | POA: Diagnosis not present

## 2018-08-11 DIAGNOSIS — E782 Mixed hyperlipidemia: Secondary | ICD-10-CM | POA: Diagnosis not present

## 2018-08-11 DIAGNOSIS — I2510 Atherosclerotic heart disease of native coronary artery without angina pectoris: Secondary | ICD-10-CM

## 2018-08-11 DIAGNOSIS — I5032 Chronic diastolic (congestive) heart failure: Secondary | ICD-10-CM | POA: Diagnosis not present

## 2018-08-11 DIAGNOSIS — I10 Essential (primary) hypertension: Secondary | ICD-10-CM | POA: Diagnosis not present

## 2018-08-11 NOTE — Progress Notes (Signed)
Cardiology Office Note:    Date:  08/11/2018   ID:  Monica [name], DOB [birth year], MRN [id number]  PCP:  Monica Mccreedy, MD  Cardiologist:  Monica Dawley, MD   Electrophysiologist:  None   Referring MD: Monica Mccreedy, MD   Chief Complaint  Patient presents with  . Hospitalization Follow-up    admx Nov-Dec 2019 for NSTEMI tx with PCI, CHF, resp failure, progressive renal disease >> dialysis started     History of Present Illness:    Monica [name] is a 85 y.o. female with coronary artery disease status post coronary artery bypass grafting in March 2017, hypertension, diabetes, asthma, acid reflux, diastolic heart failure, chronic kidney disease, peptic ulcer disease.    Nuclear stress test in July 2019 demonstrated no ischemia.  She was last seen by Dr. Meda [name] in November 2019.  She was admitted to the hospital in [place] in late November 2019 with a non-ST elevation myocardial infarction complicated by acute pulmonary edema and hypoxic respiratory failure requiring mechanical ventilation.  Review of records located in [place] indicates she had a drug-eluting stent placed to the SVG-OM (2.25 x 24 mm Synergy DES to SVG-OM by Dr. Veverly [name]).  Her EF was preserved at 55-65%.  Her renal function worsened and she was ultimately placed on dialysis.  She also had worsening anemia and received 2 units PRBCs.  She was transferred to [facility] and remained there from 12/7-12/11.  She was followed by her nephrologist there in order to start hemodialysis.   Monica [name] returns for follow up.  She is here with her son.  She is going to dialysis Tues, Thurs, Sat.  She sometimes has to stop due to low BP.  She has had a R arm AVF placed.  She denies syncope, chest pain, significant shortness of breath, leg swelling. She has not had any bleeding issues.  She is interested in attending cardiac rehabilitation at [facility].    Prior CV  studies:   The following studies were reviewed today:  Myoview 01/19/2018 Abnormal, low risk stress nuclear study with significant breast attenuation; cannot R/O prior infarct; no ischemia; EF 56 with normal wall motion.  Carotid US 09/18/15 Bilateral ICA 1-39%  LHC 09/17/15 >> CABG LAD proximal 90%, D2 80% LCx ostial 99% RCA proximal 60%, distal 75%, RPDA 25%  Echo 09/17/15 Mild focal basal septal hypertrophy, vigorous LVF, EF 65-70%, normal wall motion, grade 1 diastolic dysfunction, MAC  Past Medical History:  Diagnosis Date  . Asthma   . Bilateral pleural effusion    a. s/p CABG >> s/p bilat thoracentesis 11/2015  . Candida infection 10/2015   Bilateral breasts  . Chronic diastolic CHF   . Coronary artery disease    LHC 3/17 >> 3 v CAD >> CABG // MV 01/2018:  Abnormal, low risk stress nuclear study with significant breast attenuation; cannot R/O prior infarct; no ischemia; EF 56 with normal wall motion. // s/p NSTEMI 11/19 [facility]) >> Tx w/ DES to S-OM  . ESRD (end stage renal disease)    Dialysis Tu, Th, Sa  . Essential hypertension   . Gastroesophageal reflux disease   . History of blood transfusion   . History of echocardiogram    a. Echo 3/17: Mild focal basal septal hypertrophy, vigorous LVF, EF 65-70%, normal wall motion, grade 1 diastolic dysfunction, MAC  . Hyperlipidemia   . Mass   . Peptic ulcer   . Protein  calorie malnutrition ([place])   . Type 2 diabetes mellitus ([place])    Surgical Hx: The patient  has a past surgical history that includes Abdominal hysterectomy; Colon surgery; Cardiac catheterization (N/A, 09/17/2015); Coronary artery bypass graft (N/A, 09/19/2015); and TEE without cardioversion (N/A, 09/19/2015).   Current Medications: Current Meds  Medication Sig  . albuterol (PROVENTIL HFA;VENTOLIN HFA) 108 (90 Base) MCG/ACT inhaler Inhale 2 puffs into the lungs every 6 (six) hours as needed for wheezing or shortness of breath.  . allopurinol (ZYLOPRIM)  100 MG tablet Take 1 tablet by mouth 2 (two) times daily.  Marland Kitchen aspirin EC 81 MG EC tablet Take 1 tablet (81 mg total) by mouth daily.  Marland Kitchen atorvastatin (LIPITOR) 80 MG tablet Take 1 tablet by mouth daily.  . Calcium Carb-Cholecalciferol [phone] MG-UNIT CAPS Take 1 tablet by mouth daily.  . Cholecalciferol (VITAMIN D-3) 1000 units CAPS Take 1 capsule by mouth daily.  . clopidogrel (PLAVIX) 75 MG tablet Take 1 tablet by mouth daily.  . diphenhydrAMINE (BENADRYL) 25 MG tablet Take 25 mg by mouth as needed for itching or allergies.   . ferrous sulfate 325 (65 FE) MG tablet Take 325 mg by mouth daily with breakfast.  . insulin glargine (LANTUS) 100 UNIT/ML injection 10 UNITS INTO THE SKIN AT NIGHT BEFORE BEDTIME  . isosorbide mononitrate (IMDUR) 30 MG 24 hr tablet Take 30 mg by mouth daily.  Marland Kitchen latanoprost (XALATAN) 0.005 % ophthalmic solution Place 1 drop into both eyes at bedtime.  Marland Kitchen loratadine (CLARITIN) 10 MG tablet Take 1 tablet by mouth as needed.  . Melatonin 5 MG CAPS Take 1 tablet by mouth at bedtime.  . meloxicam (MOBIC) 7.5 MG tablet Take 1 tablet by mouth as needed.  . metoprolol tartrate (LOPRESSOR) 25 MG tablet Take 25 mg by mouth 2 (two) times daily.  . Multiple Vitamin (MULTIVITAMIN) capsule Take 1 capsule by mouth daily.  . nitroGLYCERIN (NITROSTAT) 0.4 MG SL tablet PLACE 1 TABLET BY MOUTH UNDER THE TONGUE EVERY 5 MINUTES AS NEEDED FOR CHEST PAIN. FOR UP TO 3 DOSES. IF NO RELIEF CALL 911  . Propylene Glycol (SYSTANE COMPLETE) 0.6 % SOLN Place 1 drop into both eyes 3 (three) times daily.  . vitamin B-12 (CYANOCOBALAMIN) 1000 MCG tablet Take 1,000 mcg by mouth daily.     Allergies:   Patient has no known allergies.   Social History   Tobacco Use  . Smoking status: Never Smoker  . Smokeless tobacco: Former Systems developer    Types: Snuff  Substance Use Topics  . Alcohol use: No    Alcohol/week: 0.0 standard drinks  . Drug use: No     Family Hx: The patient's family history includes  Heart attack in her father.  ROS:   Please see the history of present illness.    Review of Systems  Cardiovascular: Positive for irregular heartbeat.   All other systems reviewed and are negative.   EKGs/Labs/Other Test Reviewed:    EKG:  EKG is  ordered today.  The ekg ordered today demonstrates normal sinus rhythm, HR 72, normal axis, non-specific ST-TW changes, QTc 482, no significant changes.   Recent Labs: 12/31/2017: ALT 16 01/19/2018: BUN 49; Creatinine, Ser 2.83; Potassium 5.1; Sodium 138   Recent Lipid Panel Lab Results  Component Value Date/Time   CHOL 142 12/31/2017 09:58 AM   TRIG 116 12/31/2017 09:58 AM   HDL 43 12/31/2017 09:58 AM   CHOLHDL 3.3 12/31/2017 09:58 AM   CHOLHDL 2.8 06/26/2016 10:04 AM  [place] 76 12/31/2017 09:58 AM    Physical Exam:    VS:  BP (!) 110/58   Pulse 72   Ht _0  (1.575 m)   Wt 163 lb 6.4 oz (74.1 kg)   SpO2 97%   BMI 29.89 kg/m     Wt Readings from Last 3 Encounters:  08/11/18 163 lb 6.4 oz (74.1 kg)  05/27/18 176 lb (79.8 kg)  01/19/18 173 lb (78.5 kg)     Physical Exam  Constitutional: She is oriented to person, place, and time. She appears well-developed and well-nourished. No distress.  HENT:  Head: Normocephalic and atraumatic.  Eyes: No scleral icterus.  Neck: No thyromegaly present.  Cardiovascular: Normal rate and regular rhythm.  No murmur heard. Pulmonary/Chest: Effort normal. She has no rales.  Abdominal: Soft. She exhibits no distension.  Musculoskeletal:        General: No edema.  Lymphadenopathy:    She has no cervical adenopathy.  Neurological: She is alert and oriented to person, place, and time.  Skin: Skin is warm and dry.  Psychiatric: She has a normal mood and affect.    ASSESSMENT & PLAN:    Coronary artery disease involving native coronary artery of native heart without angina pectoris Hx of CABG in 2017.  She is s/p admission to the hospital in [place], [place] in Nov 2019 with a NSTEMI  tx with DES to S-OM.  Her hospitalization was c/b pulmonary edema requiring mechanical ventilation and she progressed to requiring dialysis. She is doing well now.  We discussed the importance of dual antiplatelet therapy. Continue ASA, Plavix, Metoprolol, Atorvastatin.  Refer to cardiac rehabilitation in [facility].  Obtain records from [facility]. FU with Dr. Meda [name] in 3 mos.   Chronic diastolic CHF (congestive heart failure) ([place]) Volume now managed by dialysis.  ESRD (end stage renal disease) ([place]) She is followed by Nephrology in [place].  She is on dialysis Tu, Th, Sa.  Essential hypertension The patient's blood pressure is controlled on her current regimen.  Continue current therapy.  BP runs low at times.  We could reduce or stop Imdur if needed.   Mixed hyperlipidemia   Continue high dose statin. Obtain L/L in 1 month. Goal LDL < 70.    Dispo:  Return in about 3 months (around 11/09/2018) for Routine Follow Up w/ Dr. Meda [name].   Medication Adjustments/Labs and Tests Ordered: Current medicines are reviewed at length with the patient today.  Concerns regarding medicines are outlined above.  Tests Ordered: Orders Placed This Encounter  Procedures  . Hepatic function panel  . Lipid panel  . EKG 12-Lead   Medication Changes: No orders of the defined types were placed in this encounter.    1.  Chronic diastolic CHF (congestive heart failure) (HCC) NYHA 2.  Volume is stable.  Continue current medical regimen.  2.  Coronary artery disease Status post CABG.  She is doing well without anginal symptoms.  Continue aspirin, statin, beta-blocker.  3.  Essential hypertension Blood pressure above target today.  It had been well controlled previously.  I have asked her to continue to monitor this over the next couple of weeks.  If her blood pressure remains above goal, she knows to contact us for further instruction.  4.  Mixed hyperlipidemia  Continue statin.  Follow up CMET, Lipids  prior to next office visit.   5.  Type 2 diabetes mellitus with stage 3 chronic kidney disease Follow up with primary care as directed  for management.   6.  CKD (chronic kidney disease), stage III ([place]) Continue follow up with Nephrology.  Arrange follow up CMET prior to next visit.   Signed, Richardson Dopp, PA-C  08/11/2018 2:14 PM    [facility] [place], [place], [place]  [address] Phone: [phone]; Fax: [phone]

## 2018-08-11 NOTE — Telephone Encounter (Signed)
Medical records requested from [facility]. vlm

## 2018-08-11 NOTE — Patient Instructions (Addendum)
Medication Instructions:  Your physician recommends that you continue on your current medications as directed. Please refer to the Current Medication list given to you today.   If you need a refill on your cardiac medications before your next appointment, please call your pharmacy.   Lab work: TO BE DONE IN 1 MONTH: LFTS, LIPIDS   If you have labs (blood work) drawn today and your tests are completely normal, you will receive your results only by: Marland Kitchen MyChart Message (if you have MyChart) OR . A paper copy in the mail If you have any lab test that is abnormal or we need to change your treatment, we will call you to review the results.  Testing/Procedures: NONE  Follow-Up: At [facility], you and your health needs are our priority.  As part of our continuing mission to provide you with exceptional heart care, we have created designated Provider Care Teams.  These Care Teams include your primary Cardiologist (physician) and Advanced Practice Providers (APPs -  Physician Assistants and Nurse Practitioners) who all work together to provide you with the care you need, when you need it. . You will need a follow up appointment in:  3 months. You may see Ena [place], MD or one of the following Advanced Practice Providers on your designated Care Team: . Richardson Dopp, PA-C . Vin Bhagat, PA-C . Daune Perch, NP  YOU HAVE BEEN REFERRED TO CARDIAC REHAB IN [facility]. THEY WILL CALL YOU TO SCHEDULE AN APPOINTMENT.    Any Other Special Instructions Will Be Listed Below (If Applicable).

## 2018-08-11 NOTE — Progress Notes (Signed)
Records from [facility] in [place], [place]:  Echocardiogram 06/04/2018 EF 55-65, normal wall motion, normal diastolic function  Cardiac catheterization 06/09/2018 LM distal 90 LAD proximal 100 LCx proximal 100 RCA mid 100 LIMA-LAD patent SVG-DX proximal 70 SVG-OM distal 99 ulcerated; OM feeds collaterals to distal RCA SVG-PDA 100 PCI: 2.25 x 24 mm Synergy DES to the SVG-OM  Richardson Dopp, PA-C    08/11/2018 5:13 PM

## 2018-08-11 NOTE — Telephone Encounter (Signed)
Medical records received from [facility]. 08/11/18 vlm

## 2018-08-12 DIAGNOSIS — N2581 Secondary hyperparathyroidism of renal origin: Secondary | ICD-10-CM | POA: Diagnosis not present

## 2018-08-12 DIAGNOSIS — E119 Type 2 diabetes mellitus without complications: Secondary | ICD-10-CM | POA: Diagnosis not present

## 2018-08-12 DIAGNOSIS — D631 Anemia in chronic kidney disease: Secondary | ICD-10-CM | POA: Diagnosis not present

## 2018-08-12 DIAGNOSIS — D509 Iron deficiency anemia, unspecified: Secondary | ICD-10-CM | POA: Diagnosis not present

## 2018-08-12 DIAGNOSIS — N186 End stage renal disease: Secondary | ICD-10-CM | POA: Diagnosis not present

## 2018-08-12 DIAGNOSIS — M109 Gout, unspecified: Secondary | ICD-10-CM | POA: Diagnosis not present

## 2018-08-14 DIAGNOSIS — N186 End stage renal disease: Secondary | ICD-10-CM | POA: Diagnosis not present

## 2018-08-14 DIAGNOSIS — D631 Anemia in chronic kidney disease: Secondary | ICD-10-CM | POA: Diagnosis not present

## 2018-08-14 DIAGNOSIS — D509 Iron deficiency anemia, unspecified: Secondary | ICD-10-CM | POA: Diagnosis not present

## 2018-08-14 DIAGNOSIS — N2581 Secondary hyperparathyroidism of renal origin: Secondary | ICD-10-CM | POA: Diagnosis not present

## 2018-08-17 DIAGNOSIS — H524 Presbyopia: Secondary | ICD-10-CM | POA: Diagnosis not present

## 2018-08-17 DIAGNOSIS — N186 End stage renal disease: Secondary | ICD-10-CM | POA: Diagnosis not present

## 2018-08-17 DIAGNOSIS — H5203 Hypermetropia, bilateral: Secondary | ICD-10-CM | POA: Diagnosis not present

## 2018-08-17 DIAGNOSIS — H401111 Primary open-angle glaucoma, right eye, mild stage: Secondary | ICD-10-CM | POA: Diagnosis not present

## 2018-08-17 DIAGNOSIS — D631 Anemia in chronic kidney disease: Secondary | ICD-10-CM | POA: Diagnosis not present

## 2018-08-17 DIAGNOSIS — H401123 Primary open-angle glaucoma, left eye, severe stage: Secondary | ICD-10-CM | POA: Diagnosis not present

## 2018-08-17 DIAGNOSIS — H52223 Regular astigmatism, bilateral: Secondary | ICD-10-CM | POA: Diagnosis not present

## 2018-08-17 DIAGNOSIS — Z23 Encounter for immunization: Secondary | ICD-10-CM | POA: Diagnosis not present

## 2018-08-17 DIAGNOSIS — E113393 Type 2 diabetes mellitus with moderate nonproliferative diabetic retinopathy without macular edema, bilateral: Secondary | ICD-10-CM | POA: Diagnosis not present

## 2018-08-17 DIAGNOSIS — Z961 Presence of intraocular lens: Secondary | ICD-10-CM | POA: Diagnosis not present

## 2018-08-19 DIAGNOSIS — N186 End stage renal disease: Secondary | ICD-10-CM | POA: Diagnosis not present

## 2018-08-19 DIAGNOSIS — D631 Anemia in chronic kidney disease: Secondary | ICD-10-CM | POA: Diagnosis not present

## 2018-08-19 DIAGNOSIS — Z23 Encounter for immunization: Secondary | ICD-10-CM | POA: Diagnosis not present

## 2018-08-20 DIAGNOSIS — Z23 Encounter for immunization: Secondary | ICD-10-CM | POA: Diagnosis not present

## 2018-08-20 DIAGNOSIS — D631 Anemia in chronic kidney disease: Secondary | ICD-10-CM | POA: Diagnosis not present

## 2018-08-20 DIAGNOSIS — N186 End stage renal disease: Secondary | ICD-10-CM | POA: Diagnosis not present

## 2018-08-23 DIAGNOSIS — D631 Anemia in chronic kidney disease: Secondary | ICD-10-CM | POA: Diagnosis not present

## 2018-08-23 DIAGNOSIS — N186 End stage renal disease: Secondary | ICD-10-CM | POA: Diagnosis not present

## 2018-08-23 DIAGNOSIS — Z23 Encounter for immunization: Secondary | ICD-10-CM | POA: Diagnosis not present

## 2018-08-25 ENCOUNTER — Telehealth: Payer: Self-pay | Admitting: Cardiology

## 2018-08-25 NOTE — Telephone Encounter (Signed)
Returned call to [facility] but no answer so I left a voice mail for [place] to return my call about Cardiac Rehab.

## 2018-08-25 NOTE — Telephone Encounter (Signed)
See Richardson Dopp, PA ov note 2/5 looks like he ordered Cardiac Rehab.

## 2018-08-25 NOTE — Telephone Encounter (Signed)
Jasmine called from [place] about an order that was faxed on 2/5.  She has some questions about it.

## 2018-08-26 DIAGNOSIS — Z23 Encounter for immunization: Secondary | ICD-10-CM | POA: Diagnosis not present

## 2018-08-26 DIAGNOSIS — D631 Anemia in chronic kidney disease: Secondary | ICD-10-CM | POA: Diagnosis not present

## 2018-08-26 DIAGNOSIS — N186 End stage renal disease: Secondary | ICD-10-CM | POA: Diagnosis not present

## 2018-08-26 NOTE — Telephone Encounter (Signed)
[place] with [facility] given ICD10 codes I21.4 and Z95.1.

## 2018-08-28 DIAGNOSIS — D631 Anemia in chronic kidney disease: Secondary | ICD-10-CM | POA: Diagnosis not present

## 2018-08-28 DIAGNOSIS — N186 End stage renal disease: Secondary | ICD-10-CM | POA: Diagnosis not present

## 2018-08-30 DIAGNOSIS — I252 Old myocardial infarction: Secondary | ICD-10-CM | POA: Diagnosis not present

## 2018-08-31 DIAGNOSIS — N186 End stage renal disease: Secondary | ICD-10-CM | POA: Diagnosis not present

## 2018-08-31 DIAGNOSIS — D631 Anemia in chronic kidney disease: Secondary | ICD-10-CM | POA: Diagnosis not present

## 2018-09-01 ENCOUNTER — Telehealth: Payer: Self-pay | Admitting: *Deleted

## 2018-09-01 DIAGNOSIS — I4891 Unspecified atrial fibrillation: Secondary | ICD-10-CM

## 2018-09-01 DIAGNOSIS — I499 Cardiac arrhythmia, unspecified: Secondary | ICD-10-CM

## 2018-09-01 DIAGNOSIS — Z951 Presence of aortocoronary bypass graft: Secondary | ICD-10-CM

## 2018-09-01 DIAGNOSIS — I2510 Atherosclerotic heart disease of native coronary artery without angina pectoris: Secondary | ICD-10-CM

## 2018-09-01 NOTE — Telephone Encounter (Signed)
Ernestine Mcmurray RN with Cardiac Rehab [facility], faxed over a note and a condensed daily report with EKG tracings on this pt, for Dr [name] and Richardson Dopp PA-C to review.  Per Barnett Applebaum RN at Cardiac rehab, she sent a progress note stating that the pt was in afib for the duration of her cardiac rehab orientation, and last time she was seen in our office on 08/11/18, she was noted to be in NSR, but positive for irregular heartbeats.  Per Barnett Applebaum RN, she noted that pt is only on plavix and ASA for 05/2018 MI & Stent, but she is on no other anticoagulants.  Showed this note along with the EKG strips Gina RN with rehab sent to [employer], and per [place], the tracings provided had a lot of artifact and it appeared to him that the pt was NOT in afib.  Per Richardson Dopp PA-C, he recommends that we could further evaluate this by ordering for the pt to have the 3-14 day long-term monitor, to further eval possible afib. Tried Agricultural consultant at Cardiac Rehab to endorse this, and she did not answer.  Left Gina RN a detailed VM about Scott's interpretation of strips she sent in, and plan to order for the pt to have a long term monitor placed. Left a message for Barnett Applebaum RN to return a call back to the office tomorrow. Also tried calling the pt on all contact numbers provided and she did not answer.  Did leave a detailed VM for her to call the office back so that I can endorse plan, per Richardson Dopp PA-C. Will go ahead and place this order in the system and await for the pt to call back, so that plan can be endorsed, and [facility] can arrange monitor appt.

## 2018-09-02 DIAGNOSIS — D631 Anemia in chronic kidney disease: Secondary | ICD-10-CM | POA: Diagnosis not present

## 2018-09-02 DIAGNOSIS — N186 End stage renal disease: Secondary | ICD-10-CM | POA: Diagnosis not present

## 2018-09-02 NOTE — Telephone Encounter (Signed)
Pts monitor appt is scheduled for 09/15/18 at 0900.  Pt made aware of appt date and time by [facility] scheduling.

## 2018-09-02 NOTE — Telephone Encounter (Addendum)
Spoke with Barnett Applebaum RN at [place] [phone] and endorsed to her that Richardson Dopp PA-C reviewed her note and EKG strips she sent in on this pt.  Informed Gina RN that Nicki Reaper did not see any afib, but did order for her to get the zio patch to rule this out, given her cardiac history.  Informed Barnett Applebaum RN that the pt will have her monitor placed on 09/15/18 by our office.  Per Barnett Applebaum, she stated that family is wanting to back out on proceeding with her cardiac rehab, but she  highly encouraged them to keep her in the program.  Barnett Applebaum RN states that they were in agreement with this, but she will keep [place] informed if they choose not to follow through.  Informed Barnett Applebaum RN that I also talked to her daughter from [place], whom handles a lot of her medical affairs, and reiterated the importance and benefit the pt will have from attending cardiac rehab. Gina RN verbalized understanding and agrees with this plan.  Barnett Applebaum was gracious for all the assistance provided.

## 2018-09-02 NOTE — Telephone Encounter (Signed)
Spoke with the pts Daughter from [place] and she stated she received the VM left and yes she agrees that the pt should have the monitor placed, to be on the safe side and rule out any afib.  Per the pts Daughter in [place], she request that we call the pt on her home number (updated in chart) at [phone], or call her sister that lives with the pt Tresa Moore at [phone] (updated in chart as well).  That way the schedulers can make contact with them and arrange for her needed monitor appt.  Daughter verbalized understanding and agrees with this plan.

## 2018-09-02 NOTE — Telephone Encounter (Signed)
° °  Please call daughter/ patient  at [phone]

## 2018-09-04 DIAGNOSIS — Z992 Dependence on renal dialysis: Secondary | ICD-10-CM | POA: Diagnosis not present

## 2018-09-04 DIAGNOSIS — D631 Anemia in chronic kidney disease: Secondary | ICD-10-CM | POA: Diagnosis not present

## 2018-09-04 DIAGNOSIS — N186 End stage renal disease: Secondary | ICD-10-CM | POA: Diagnosis not present

## 2018-09-06 DIAGNOSIS — N186 End stage renal disease: Secondary | ICD-10-CM | POA: Diagnosis not present

## 2018-09-07 ENCOUNTER — Other Ambulatory Visit: Payer: Medicare HMO

## 2018-09-07 DIAGNOSIS — N186 End stage renal disease: Secondary | ICD-10-CM | POA: Diagnosis not present

## 2018-09-07 DIAGNOSIS — D631 Anemia in chronic kidney disease: Secondary | ICD-10-CM | POA: Diagnosis not present

## 2018-09-08 ENCOUNTER — Other Ambulatory Visit: Payer: Medicare HMO | Admitting: *Deleted

## 2018-09-08 DIAGNOSIS — I2510 Atherosclerotic heart disease of native coronary artery without angina pectoris: Secondary | ICD-10-CM | POA: Diagnosis not present

## 2018-09-08 DIAGNOSIS — E782 Mixed hyperlipidemia: Secondary | ICD-10-CM | POA: Diagnosis not present

## 2018-09-08 DIAGNOSIS — N186 End stage renal disease: Secondary | ICD-10-CM | POA: Diagnosis not present

## 2018-09-08 DIAGNOSIS — I5032 Chronic diastolic (congestive) heart failure: Secondary | ICD-10-CM | POA: Diagnosis not present

## 2018-09-08 DIAGNOSIS — I10 Essential (primary) hypertension: Secondary | ICD-10-CM

## 2018-09-08 LAB — LIPID PANEL
Chol/HDL Ratio: 2.5 ratio (ref 0.0–4.4)
Cholesterol, Total: 125 mg/dL (ref 100–199)
HDL: 50 mg/dL (ref >39)
LDL Calculated: 53 mg/dL (ref 0–99)
Triglycerides: 109 mg/dL (ref 0–149)
VLDL Cholesterol Cal: 22 mg/dL (ref 5–40)

## 2018-09-08 LAB — HEPATIC FUNCTION PANEL
ALT: 14 IU/L (ref 0–32)
AST: 26 IU/L (ref 0–40)
Albumin: 4.2 g/dL (ref 3.6–4.6)
Alkaline Phosphatase: 79 IU/L (ref 39–117)
Bilirubin Total: 0.3 mg/dL (ref 0.0–1.2)
Bilirubin, Direct: 0.09 mg/dL (ref 0.00–0.40)
Total Protein: 7.1 g/dL (ref 6.0–8.5)

## 2018-09-09 DIAGNOSIS — N186 End stage renal disease: Secondary | ICD-10-CM | POA: Diagnosis not present

## 2018-09-09 DIAGNOSIS — E119 Type 2 diabetes mellitus without complications: Secondary | ICD-10-CM | POA: Diagnosis not present

## 2018-09-09 DIAGNOSIS — M109 Gout, unspecified: Secondary | ICD-10-CM | POA: Diagnosis not present

## 2018-09-09 DIAGNOSIS — D631 Anemia in chronic kidney disease: Secondary | ICD-10-CM | POA: Diagnosis not present

## 2018-09-11 DIAGNOSIS — D631 Anemia in chronic kidney disease: Secondary | ICD-10-CM | POA: Diagnosis not present

## 2018-09-11 DIAGNOSIS — N186 End stage renal disease: Secondary | ICD-10-CM | POA: Diagnosis not present

## 2018-09-13 DIAGNOSIS — D631 Anemia in chronic kidney disease: Secondary | ICD-10-CM | POA: Diagnosis not present

## 2018-09-13 DIAGNOSIS — N186 End stage renal disease: Secondary | ICD-10-CM | POA: Diagnosis not present

## 2018-09-14 DIAGNOSIS — Z992 Dependence on renal dialysis: Secondary | ICD-10-CM | POA: Diagnosis not present

## 2018-09-14 DIAGNOSIS — N186 End stage renal disease: Secondary | ICD-10-CM | POA: Diagnosis not present

## 2018-09-14 DIAGNOSIS — Z7902 Long term (current) use of antithrombotics/antiplatelets: Secondary | ICD-10-CM | POA: Diagnosis not present

## 2018-09-15 ENCOUNTER — Ambulatory Visit (INDEPENDENT_AMBULATORY_CARE_PROVIDER_SITE_OTHER): Payer: Medicare HMO

## 2018-09-15 ENCOUNTER — Other Ambulatory Visit: Payer: Self-pay

## 2018-09-15 DIAGNOSIS — Z951 Presence of aortocoronary bypass graft: Secondary | ICD-10-CM

## 2018-09-15 DIAGNOSIS — I2510 Atherosclerotic heart disease of native coronary artery without angina pectoris: Secondary | ICD-10-CM

## 2018-09-15 DIAGNOSIS — I499 Cardiac arrhythmia, unspecified: Secondary | ICD-10-CM

## 2018-09-15 DIAGNOSIS — I4891 Unspecified atrial fibrillation: Secondary | ICD-10-CM | POA: Diagnosis not present

## 2018-09-16 DIAGNOSIS — D509 Iron deficiency anemia, unspecified: Secondary | ICD-10-CM | POA: Diagnosis not present

## 2018-09-16 DIAGNOSIS — R7881 Bacteremia: Secondary | ICD-10-CM | POA: Diagnosis not present

## 2018-09-16 DIAGNOSIS — N186 End stage renal disease: Secondary | ICD-10-CM | POA: Diagnosis not present

## 2018-09-16 DIAGNOSIS — D631 Anemia in chronic kidney disease: Secondary | ICD-10-CM | POA: Diagnosis not present

## 2018-09-18 DIAGNOSIS — D509 Iron deficiency anemia, unspecified: Secondary | ICD-10-CM | POA: Diagnosis not present

## 2018-09-18 DIAGNOSIS — R7881 Bacteremia: Secondary | ICD-10-CM | POA: Diagnosis not present

## 2018-09-18 DIAGNOSIS — D631 Anemia in chronic kidney disease: Secondary | ICD-10-CM | POA: Diagnosis not present

## 2018-09-18 DIAGNOSIS — N186 End stage renal disease: Secondary | ICD-10-CM | POA: Diagnosis not present

## 2018-09-20 DIAGNOSIS — I119 Hypertensive heart disease without heart failure: Secondary | ICD-10-CM | POA: Diagnosis not present

## 2018-09-20 DIAGNOSIS — E782 Mixed hyperlipidemia: Secondary | ICD-10-CM | POA: Diagnosis not present

## 2018-09-20 DIAGNOSIS — N186 End stage renal disease: Secondary | ICD-10-CM | POA: Diagnosis not present

## 2018-09-20 DIAGNOSIS — E1151 Type 2 diabetes mellitus with diabetic peripheral angiopathy without gangrene: Secondary | ICD-10-CM | POA: Diagnosis not present

## 2018-09-20 DIAGNOSIS — L03114 Cellulitis of left upper limb: Secondary | ICD-10-CM | POA: Diagnosis not present

## 2018-09-20 DIAGNOSIS — Z992 Dependence on renal dialysis: Secondary | ICD-10-CM | POA: Diagnosis not present

## 2018-09-20 DIAGNOSIS — I2510 Atherosclerotic heart disease of native coronary artery without angina pectoris: Secondary | ICD-10-CM | POA: Diagnosis not present

## 2018-09-20 DIAGNOSIS — M1A49X0 Other secondary chronic gout, multiple sites, without tophus (tophi): Secondary | ICD-10-CM | POA: Diagnosis not present

## 2018-09-20 DIAGNOSIS — Z794 Long term (current) use of insulin: Secondary | ICD-10-CM | POA: Diagnosis not present

## 2018-09-21 DIAGNOSIS — D509 Iron deficiency anemia, unspecified: Secondary | ICD-10-CM | POA: Diagnosis not present

## 2018-09-21 DIAGNOSIS — D631 Anemia in chronic kidney disease: Secondary | ICD-10-CM | POA: Diagnosis not present

## 2018-09-21 DIAGNOSIS — R7881 Bacteremia: Secondary | ICD-10-CM | POA: Diagnosis not present

## 2018-09-21 DIAGNOSIS — N186 End stage renal disease: Secondary | ICD-10-CM | POA: Diagnosis not present

## 2018-09-23 DIAGNOSIS — D509 Iron deficiency anemia, unspecified: Secondary | ICD-10-CM | POA: Diagnosis not present

## 2018-09-23 DIAGNOSIS — D631 Anemia in chronic kidney disease: Secondary | ICD-10-CM | POA: Diagnosis not present

## 2018-09-23 DIAGNOSIS — R7881 Bacteremia: Secondary | ICD-10-CM | POA: Diagnosis not present

## 2018-09-23 DIAGNOSIS — N186 End stage renal disease: Secondary | ICD-10-CM | POA: Diagnosis not present

## 2018-09-25 DIAGNOSIS — D631 Anemia in chronic kidney disease: Secondary | ICD-10-CM | POA: Diagnosis not present

## 2018-09-25 DIAGNOSIS — N186 End stage renal disease: Secondary | ICD-10-CM | POA: Diagnosis not present

## 2018-09-28 DIAGNOSIS — N186 End stage renal disease: Secondary | ICD-10-CM | POA: Diagnosis not present

## 2018-09-28 DIAGNOSIS — D631 Anemia in chronic kidney disease: Secondary | ICD-10-CM | POA: Diagnosis not present

## 2018-09-30 DIAGNOSIS — D631 Anemia in chronic kidney disease: Secondary | ICD-10-CM | POA: Diagnosis not present

## 2018-09-30 DIAGNOSIS — N186 End stage renal disease: Secondary | ICD-10-CM | POA: Diagnosis not present

## 2018-10-01 ENCOUNTER — Ambulatory Visit: Payer: Medicare HMO | Admitting: Cardiology

## 2018-10-02 DIAGNOSIS — D631 Anemia in chronic kidney disease: Secondary | ICD-10-CM | POA: Diagnosis not present

## 2018-10-02 DIAGNOSIS — N186 End stage renal disease: Secondary | ICD-10-CM | POA: Diagnosis not present

## 2018-10-05 DIAGNOSIS — N186 End stage renal disease: Secondary | ICD-10-CM | POA: Diagnosis not present

## 2018-10-05 DIAGNOSIS — Z992 Dependence on renal dialysis: Secondary | ICD-10-CM | POA: Diagnosis not present

## 2018-10-05 DIAGNOSIS — D631 Anemia in chronic kidney disease: Secondary | ICD-10-CM | POA: Diagnosis not present

## 2018-10-05 DIAGNOSIS — I4891 Unspecified atrial fibrillation: Secondary | ICD-10-CM | POA: Diagnosis not present

## 2018-10-07 ENCOUNTER — Encounter: Payer: Self-pay | Admitting: Physician Assistant

## 2018-10-07 DIAGNOSIS — E785 Hyperlipidemia, unspecified: Secondary | ICD-10-CM | POA: Diagnosis not present

## 2018-10-07 DIAGNOSIS — D509 Iron deficiency anemia, unspecified: Secondary | ICD-10-CM | POA: Diagnosis not present

## 2018-10-07 DIAGNOSIS — D631 Anemia in chronic kidney disease: Secondary | ICD-10-CM | POA: Diagnosis not present

## 2018-10-07 DIAGNOSIS — E119 Type 2 diabetes mellitus without complications: Secondary | ICD-10-CM | POA: Diagnosis not present

## 2018-10-07 DIAGNOSIS — M109 Gout, unspecified: Secondary | ICD-10-CM | POA: Diagnosis not present

## 2018-10-07 DIAGNOSIS — N186 End stage renal disease: Secondary | ICD-10-CM | POA: Diagnosis not present

## 2018-10-07 DIAGNOSIS — N2581 Secondary hyperparathyroidism of renal origin: Secondary | ICD-10-CM | POA: Diagnosis not present

## 2018-10-08 DIAGNOSIS — R309 Painful micturition, unspecified: Secondary | ICD-10-CM | POA: Diagnosis not present

## 2018-10-09 DIAGNOSIS — D631 Anemia in chronic kidney disease: Secondary | ICD-10-CM | POA: Diagnosis not present

## 2018-10-09 DIAGNOSIS — D509 Iron deficiency anemia, unspecified: Secondary | ICD-10-CM | POA: Diagnosis not present

## 2018-10-09 DIAGNOSIS — E785 Hyperlipidemia, unspecified: Secondary | ICD-10-CM | POA: Diagnosis not present

## 2018-10-09 DIAGNOSIS — N2581 Secondary hyperparathyroidism of renal origin: Secondary | ICD-10-CM | POA: Diagnosis not present

## 2018-10-09 DIAGNOSIS — N186 End stage renal disease: Secondary | ICD-10-CM | POA: Diagnosis not present

## 2018-10-11 DIAGNOSIS — I2510 Atherosclerotic heart disease of native coronary artery without angina pectoris: Secondary | ICD-10-CM | POA: Diagnosis not present

## 2018-10-11 DIAGNOSIS — Z794 Long term (current) use of insulin: Secondary | ICD-10-CM | POA: Diagnosis not present

## 2018-10-11 DIAGNOSIS — E782 Mixed hyperlipidemia: Secondary | ICD-10-CM | POA: Diagnosis not present

## 2018-10-11 DIAGNOSIS — I119 Hypertensive heart disease without heart failure: Secondary | ICD-10-CM | POA: Diagnosis not present

## 2018-10-11 DIAGNOSIS — E1151 Type 2 diabetes mellitus with diabetic peripheral angiopathy without gangrene: Secondary | ICD-10-CM | POA: Diagnosis not present

## 2018-10-11 DIAGNOSIS — Z992 Dependence on renal dialysis: Secondary | ICD-10-CM | POA: Diagnosis not present

## 2018-10-11 DIAGNOSIS — N186 End stage renal disease: Secondary | ICD-10-CM | POA: Diagnosis not present

## 2018-10-11 DIAGNOSIS — M1A49X0 Other secondary chronic gout, multiple sites, without tophus (tophi): Secondary | ICD-10-CM | POA: Diagnosis not present

## 2018-10-12 DIAGNOSIS — N186 End stage renal disease: Secondary | ICD-10-CM | POA: Diagnosis not present

## 2018-10-12 DIAGNOSIS — D631 Anemia in chronic kidney disease: Secondary | ICD-10-CM | POA: Diagnosis not present

## 2018-10-12 DIAGNOSIS — D509 Iron deficiency anemia, unspecified: Secondary | ICD-10-CM | POA: Diagnosis not present

## 2018-10-12 DIAGNOSIS — N2581 Secondary hyperparathyroidism of renal origin: Secondary | ICD-10-CM | POA: Diagnosis not present

## 2018-10-12 DIAGNOSIS — E785 Hyperlipidemia, unspecified: Secondary | ICD-10-CM | POA: Diagnosis not present

## 2018-10-14 DIAGNOSIS — D631 Anemia in chronic kidney disease: Secondary | ICD-10-CM | POA: Diagnosis not present

## 2018-10-14 DIAGNOSIS — E785 Hyperlipidemia, unspecified: Secondary | ICD-10-CM | POA: Diagnosis not present

## 2018-10-14 DIAGNOSIS — D509 Iron deficiency anemia, unspecified: Secondary | ICD-10-CM | POA: Diagnosis not present

## 2018-10-14 DIAGNOSIS — N186 End stage renal disease: Secondary | ICD-10-CM | POA: Diagnosis not present

## 2018-10-14 DIAGNOSIS — N2581 Secondary hyperparathyroidism of renal origin: Secondary | ICD-10-CM | POA: Diagnosis not present

## 2018-10-16 DIAGNOSIS — N2581 Secondary hyperparathyroidism of renal origin: Secondary | ICD-10-CM | POA: Diagnosis not present

## 2018-10-16 DIAGNOSIS — D509 Iron deficiency anemia, unspecified: Secondary | ICD-10-CM | POA: Diagnosis not present

## 2018-10-16 DIAGNOSIS — N186 End stage renal disease: Secondary | ICD-10-CM | POA: Diagnosis not present

## 2018-10-19 DIAGNOSIS — D509 Iron deficiency anemia, unspecified: Secondary | ICD-10-CM | POA: Diagnosis not present

## 2018-10-19 DIAGNOSIS — N2581 Secondary hyperparathyroidism of renal origin: Secondary | ICD-10-CM | POA: Diagnosis not present

## 2018-10-19 DIAGNOSIS — N186 End stage renal disease: Secondary | ICD-10-CM | POA: Diagnosis not present

## 2018-10-21 DIAGNOSIS — D509 Iron deficiency anemia, unspecified: Secondary | ICD-10-CM | POA: Diagnosis not present

## 2018-10-21 DIAGNOSIS — N186 End stage renal disease: Secondary | ICD-10-CM | POA: Diagnosis not present

## 2018-10-21 DIAGNOSIS — N2581 Secondary hyperparathyroidism of renal origin: Secondary | ICD-10-CM | POA: Diagnosis not present

## 2018-10-23 DIAGNOSIS — N2581 Secondary hyperparathyroidism of renal origin: Secondary | ICD-10-CM | POA: Diagnosis not present

## 2018-10-23 DIAGNOSIS — D509 Iron deficiency anemia, unspecified: Secondary | ICD-10-CM | POA: Diagnosis not present

## 2018-10-23 DIAGNOSIS — N186 End stage renal disease: Secondary | ICD-10-CM | POA: Diagnosis not present

## 2018-10-26 DIAGNOSIS — N186 End stage renal disease: Secondary | ICD-10-CM | POA: Diagnosis not present

## 2018-10-26 DIAGNOSIS — D631 Anemia in chronic kidney disease: Secondary | ICD-10-CM | POA: Diagnosis not present

## 2018-10-26 DIAGNOSIS — N2581 Secondary hyperparathyroidism of renal origin: Secondary | ICD-10-CM | POA: Diagnosis not present

## 2018-10-26 DIAGNOSIS — T8249XA Other complication of vascular dialysis catheter, initial encounter: Secondary | ICD-10-CM | POA: Diagnosis not present

## 2018-10-28 DIAGNOSIS — N186 End stage renal disease: Secondary | ICD-10-CM | POA: Diagnosis not present

## 2018-10-28 DIAGNOSIS — T8249XA Other complication of vascular dialysis catheter, initial encounter: Secondary | ICD-10-CM | POA: Diagnosis not present

## 2018-10-28 DIAGNOSIS — N2581 Secondary hyperparathyroidism of renal origin: Secondary | ICD-10-CM | POA: Diagnosis not present

## 2018-10-28 DIAGNOSIS — D631 Anemia in chronic kidney disease: Secondary | ICD-10-CM | POA: Diagnosis not present

## 2018-10-30 DIAGNOSIS — D631 Anemia in chronic kidney disease: Secondary | ICD-10-CM | POA: Diagnosis not present

## 2018-10-30 DIAGNOSIS — N186 End stage renal disease: Secondary | ICD-10-CM | POA: Diagnosis not present

## 2018-10-30 DIAGNOSIS — T8249XA Other complication of vascular dialysis catheter, initial encounter: Secondary | ICD-10-CM | POA: Diagnosis not present

## 2018-10-30 DIAGNOSIS — N2581 Secondary hyperparathyroidism of renal origin: Secondary | ICD-10-CM | POA: Diagnosis not present

## 2018-11-02 DIAGNOSIS — N186 End stage renal disease: Secondary | ICD-10-CM | POA: Diagnosis not present

## 2018-11-02 DIAGNOSIS — N2581 Secondary hyperparathyroidism of renal origin: Secondary | ICD-10-CM | POA: Diagnosis not present

## 2018-11-02 DIAGNOSIS — D631 Anemia in chronic kidney disease: Secondary | ICD-10-CM | POA: Diagnosis not present

## 2018-11-02 DIAGNOSIS — T8249XA Other complication of vascular dialysis catheter, initial encounter: Secondary | ICD-10-CM | POA: Diagnosis not present

## 2018-11-04 DIAGNOSIS — T8249XA Other complication of vascular dialysis catheter, initial encounter: Secondary | ICD-10-CM | POA: Diagnosis not present

## 2018-11-04 DIAGNOSIS — D631 Anemia in chronic kidney disease: Secondary | ICD-10-CM | POA: Diagnosis not present

## 2018-11-04 DIAGNOSIS — N2581 Secondary hyperparathyroidism of renal origin: Secondary | ICD-10-CM | POA: Diagnosis not present

## 2018-11-04 DIAGNOSIS — N186 End stage renal disease: Secondary | ICD-10-CM | POA: Diagnosis not present

## 2018-11-04 DIAGNOSIS — Z992 Dependence on renal dialysis: Secondary | ICD-10-CM | POA: Diagnosis not present

## 2018-11-06 DIAGNOSIS — D631 Anemia in chronic kidney disease: Secondary | ICD-10-CM | POA: Diagnosis not present

## 2018-11-06 DIAGNOSIS — N186 End stage renal disease: Secondary | ICD-10-CM | POA: Diagnosis not present

## 2018-11-06 DIAGNOSIS — Z23 Encounter for immunization: Secondary | ICD-10-CM | POA: Diagnosis not present

## 2018-11-06 DIAGNOSIS — N2581 Secondary hyperparathyroidism of renal origin: Secondary | ICD-10-CM | POA: Diagnosis not present

## 2018-11-09 DIAGNOSIS — N186 End stage renal disease: Secondary | ICD-10-CM | POA: Diagnosis not present

## 2018-11-09 DIAGNOSIS — D631 Anemia in chronic kidney disease: Secondary | ICD-10-CM | POA: Diagnosis not present

## 2018-11-09 DIAGNOSIS — N2581 Secondary hyperparathyroidism of renal origin: Secondary | ICD-10-CM | POA: Diagnosis not present

## 2018-11-09 DIAGNOSIS — Z23 Encounter for immunization: Secondary | ICD-10-CM | POA: Diagnosis not present

## 2018-11-11 DIAGNOSIS — M109 Gout, unspecified: Secondary | ICD-10-CM | POA: Diagnosis not present

## 2018-11-11 DIAGNOSIS — N2581 Secondary hyperparathyroidism of renal origin: Secondary | ICD-10-CM | POA: Diagnosis not present

## 2018-11-11 DIAGNOSIS — N186 End stage renal disease: Secondary | ICD-10-CM | POA: Diagnosis not present

## 2018-11-11 DIAGNOSIS — D631 Anemia in chronic kidney disease: Secondary | ICD-10-CM | POA: Diagnosis not present

## 2018-11-11 DIAGNOSIS — Z23 Encounter for immunization: Secondary | ICD-10-CM | POA: Diagnosis not present

## 2018-11-11 DIAGNOSIS — E119 Type 2 diabetes mellitus without complications: Secondary | ICD-10-CM | POA: Diagnosis not present

## 2018-11-13 DIAGNOSIS — Z23 Encounter for immunization: Secondary | ICD-10-CM | POA: Diagnosis not present

## 2018-11-13 DIAGNOSIS — N2581 Secondary hyperparathyroidism of renal origin: Secondary | ICD-10-CM | POA: Diagnosis not present

## 2018-11-13 DIAGNOSIS — D631 Anemia in chronic kidney disease: Secondary | ICD-10-CM | POA: Diagnosis not present

## 2018-11-13 DIAGNOSIS — N186 End stage renal disease: Secondary | ICD-10-CM | POA: Diagnosis not present

## 2018-11-15 ENCOUNTER — Telehealth: Payer: Self-pay | Admitting: Physician Assistant

## 2018-11-15 ENCOUNTER — Telehealth: Payer: Self-pay | Admitting: *Deleted

## 2018-11-15 NOTE — Telephone Encounter (Signed)
New Message  Patient son will be with her for virtual visit please call his phone [phone].

## 2018-11-15 NOTE — Telephone Encounter (Signed)
SPOKE WITH PT DTR  AND PT CONSENTED TO DOXY ME WILL CALL BACK WITH NUMBER    Confirm consent - "In the setting of the current Covid19 crisis, you are scheduled for a (phone or video) visit with your provider on (date) at (time).  Just as we do with many in-office visits, in order for you to participate in this visit, we must obtain consent.  If you'd like, I can send this to your mychart (if signed up) or email for you to review.  Otherwise, I can obtain your verbal consent now.  All virtual visits are billed to your insurance company just like a normal visit would be.  By agreeing to a virtual visit, we'd like you to understand that the technology does not allow for your provider to perform an examination, and thus may limit your provider's ability to fully assess your condition. If your provider identifies any concerns that need to be evaluated in person, we will make arrangements to do so.  Finally, though the technology is pretty good, we cannot assure that it will always work on either your or our end, and in the setting of a video visit, we may have to convert it to a phone-only visit.  In either situation, we cannot ensure that we have a secure connection.  Are you willing to proceed?" STAFF: Did the patient verbally acknowledge consent to telehealth visit? Document YES/NO here:  YES   1. Advise patient to be prepared - "Two hours prior to your appointment, go ahead and check your blood pressure, pulse, oxygen saturation, and your weight (if you have the equipment to check those) and write them all down. When your visit starts, your provider will ask you for this information. If you have an Apple Watch or Kardia device, please plan to have heart rate information ready on the day of your appointment. Please have a pen and paper handy nearby the day of the visit as well."  2. Give patient instructions for MyChart download to smartphone OR Doximity/Doxy.me as below if video visit (depending on what  platform provider is using)  3. Inform patient they will receive a phone call 15 minutes prior to their appointment time (may be from unknown caller ID) so they should be prepared to answer    TELEPHONE CALL NOTE  Monica [name] has been deemed a candidate for a follow-up tele-health visit to limit community exposure during the Covid-19 pandemic. I spoke with the patient via phone to ensure availability of phone/video source, confirm preferred email & phone number, and discuss instructions and expectations.  I reminded Monica [name] to be prepared with any vital sign and/or heart rhythm information that could potentially be obtained via home monitoring, at the time of her visit. I reminded Monica [name] to expect a phone call prior to her visit.  Claude Manges, CMA 11/15/2018 3:14 PM   IIF USING DOXIMITY or DOXY.ME - The patient will receive a link just prior to their visit by text.     FULL LENGTH CONSENT FOR TELE-HEALTH VISIT   I hereby voluntarily request, consent and authorize [facility] and its employed or contracted physicians, physician assistants, nurse practitioners or other licensed health care professionals (the Practitioner), to provide me with telemedicine health care services (the "Services") as deemed necessary by the treating Practitioner. I acknowledge and consent to receive the Services by the Practitioner via telemedicine. I understand that the telemedicine visit will involve communicating with the Practitioner through live audiovisual communication technology  and the disclosure of certain medical information by electronic transmission. I acknowledge that I have been given the opportunity to request an in-person assessment or other available alternative prior to the telemedicine visit and am voluntarily participating in the telemedicine visit.  I understand that I have the right to withhold or withdraw my consent to the use of telemedicine in the course of my care at any time,  without affecting my right to future care or treatment, and that the Practitioner or I may terminate the telemedicine visit at any time. I understand that I have the right to inspect all information obtained and/or recorded in the course of the telemedicine visit and may receive copies of available information for a reasonable fee.  I understand that some of the potential risks of receiving the Services via telemedicine include:  Marland Kitchen Delay or interruption in medical evaluation due to technological equipment failure or disruption; . Information transmitted may not be sufficient (e.g. poor resolution of images) to allow for appropriate medical decision making by the Practitioner; and/or  . In rare instances, security protocols could fail, causing a breach of personal health information.  Furthermore, I acknowledge that it is my responsibility to provide information about my medical history, conditions and care that is complete and accurate to the best of my ability. I acknowledge that Practitioner's advice, recommendations, and/or decision may be based on factors not within their control, such as incomplete or inaccurate data provided by me or distortions of diagnostic images or specimens that may result from electronic transmissions. I understand that the practice of medicine is not an exact science and that Practitioner makes no warranties or guarantees regarding treatment outcomes. I acknowledge that I will receive a copy of this consent concurrently upon execution via email to the email address I last provided but may also request a printed copy by calling the office of [facility].    I understand that my insurance will be billed for this visit.   I have read or had this consent read to me. . I understand the contents of this consent, which adequately explains the benefits and risks of the Services being provided via telemedicine.  . I have been provided ample opportunity to ask questions regarding  this consent and the Services and have had my questions answered to my satisfaction. . I give my informed consent for the services to be provided through the use of telemedicine in my medical care  By participating in this telemedicine visit I agree to the above.

## 2018-11-16 DIAGNOSIS — D631 Anemia in chronic kidney disease: Secondary | ICD-10-CM | POA: Diagnosis not present

## 2018-11-16 DIAGNOSIS — N2581 Secondary hyperparathyroidism of renal origin: Secondary | ICD-10-CM | POA: Diagnosis not present

## 2018-11-16 DIAGNOSIS — N186 End stage renal disease: Secondary | ICD-10-CM | POA: Diagnosis not present

## 2018-11-16 NOTE — Progress Notes (Signed)
Virtual Visit via Video Note   This visit type was conducted due to national recommendations for restrictions regarding the COVID-19 Pandemic (e.g. social distancing) in an effort to limit this patient's exposure and mitigate transmission in our community.  Due to her co-morbid illnesses, this patient is at least at moderate risk for complications without adequate follow up.  This format is felt to be most appropriate for this patient at this time.  All issues noted in this document were discussed and addressed.  A limited physical exam was performed with this format.  Please refer to the patient's chart for her consent to telehealth for [facility].   Date:  11/17/2018   ID:  Monica [name], DOB [birth year], MRN [id number]  Patient Location: Home Provider Location: Home  PCP:  Benito Mccreedy, MD  Cardiologist:  Ena Dawley, MD   Electrophysiologist:  None   Evaluation Performed:  Follow-Up Visit  Chief Complaint:  Follow up on coronary artery disease  History of Present Illness:    Monica [name] is a 85 y.o. female with  coronary artery disease status post coronary artery bypass grafting in March 2017, hypertension, diabetes, asthma, acid reflux, diastolic heart failure, ESRD on hemodialysis, peptic ulcer disease.    Nuclear stress test in July 2019 demonstrated no ischemia.   She was admitted to the hospital in [place], [place] in 05/2018 for a NSTEMI.  She had a DES placed to the OM.  Her renal function worsened and she was started on dialysis.  She was last seen 08/11/2018.    Today, she notes she is doing well.  She had an episode of angina about a month ago when she took nitroglycerin x1.  Her symptoms resolved completely.  This occurred after eating a meal.  She has not had any exertional chest symptoms.  She has not had any significant shortness of breath.  She has not had orthopnea, lower extremity swelling or syncope.  She goes to dialysis every Tuesday, Thursday, Saturday.  She  has had some trouble getting her AV fistula to mature.  The patient does not have symptoms concerning for COVID-19 infection (fever, chills, cough, or new shortness of breath).    Past Medical History:  Diagnosis Date   Asthma    Bilateral pleural effusion    a. s/p CABG >> s/p bilat thoracentesis 11/2015   Candida infection 10/2015   Bilateral breasts   Chronic diastolic CHF    Echo 38/93 [facility]):  EF 55-65, normal wall motion, normal diastolic function   Coronary artery disease    LHC 3/17 >> 3 v CAD >> CABG // MV 01/2018:  Abnormal, low risk stress nuclear study with significant breast attenuation; cannot R/O prior infarct; no ischemia; EF 56 with normal wall motion. // s/p NSTEMI 11/19 [facility]) >> LHC:  dLM 90, pLAD 100, pLCx 100, mRCA 100, L-LAD ok, S-Dx prox 70, S-OM dist 99 (ulc); S-PDA 100 >>PCI: 2.25 x 24 mm Synergy DES to S-OM //  EF normal by Echo 11/19   ESRD (end stage renal disease)    Dialysis Tu, Th, Sa   Essential hypertension    Gastroesophageal reflux disease    History of blood transfusion    History of echocardiogram    a. Echo 3/17: Mild focal basal septal hypertrophy, vigorous LVF, EF 65-70%, normal wall motion, grade 1 diastolic dysfunction, MAC   Hyperlipidemia    Long Term Cardiac Monitor 3-14 day    LT Monitor 09/2018: SR,  Sinus Tach, 6 beats NSVT, 38 runs of SVT (brief); no AFib.   Mass    Peptic ulcer    Protein calorie malnutrition ([place])    Type 2 diabetes mellitus ([place])    Past Surgical History:  Procedure Laterality Date   ABDOMINAL HYSTERECTOMY     CARDIAC CATHETERIZATION N/A 09/17/2015   Procedure: Left Heart Cath and Coronary Angiography;  Surgeon: Jettie Booze, MD;  Location: [place] [facility];  Service: Cardiovascular;  Laterality: N/A;   COLON SURGERY     CORONARY ARTERY BYPASS GRAFT N/A 09/19/2015   Procedure: CORONARY ARTERY BYPASS GRAFTING (CABG) TIMES FOUR USING BILATARAL SAPHENOUS VEIN GRAFTS AND  LEFT INTERNAL MAMMARY ARTERY;  Surgeon: [name], MD;  Location: [place];  Service: Open Heart Surgery;  Laterality: N/A;   TEE WITHOUT CARDIOVERSION N/A 09/19/2015   Procedure: TRANSESOPHAGEAL ECHOCARDIOGRAM (TEE);  Surgeon: [name], MD;  Location: [place];  Service: Open Heart Surgery;  Laterality: N/A;     Current Meds  Medication Sig   albuterol (PROVENTIL HFA;VENTOLIN HFA) 108 (90 Base) MCG/ACT inhaler Inhale 2 puffs into the lungs every 6 (six) hours as needed for wheezing or shortness of breath.   allopurinol (ZYLOPRIM) 100 MG tablet Take 1 tablet by mouth 2 (two) times daily.   aspirin EC 81 MG EC tablet Take 1 tablet (81 mg total) by mouth daily.   atorvastatin (LIPITOR) 80 MG tablet Take 1 tablet by mouth daily.   Cholecalciferol (VITAMIN D-3) 1000 units CAPS Take 1 capsule by mouth daily.   clopidogrel (PLAVIX) 75 MG tablet Take 1 tablet by mouth daily.   diphenhydrAMINE (BENADRYL) 25 MG tablet Take 25 mg by mouth as needed for itching or allergies.    ferric citrate (AURYXIA) 1 GM 210 MG(Fe) tablet Take 420 mg by mouth 3 (three) times daily with meals.   insulin glargine (LANTUS) 100 UNIT/ML injection 10 UNITS INTO THE SKIN AT NIGHT BEFORE BEDTIME   isosorbide mononitrate (IMDUR) 30 MG 24 hr tablet Take 30 mg by mouth daily.   latanoprost (XALATAN) 0.005 % ophthalmic solution Place 1 drop into both eyes at bedtime.   loratadine (CLARITIN) 10 MG tablet Take 1 tablet by mouth as needed.   Melatonin 5 MG CAPS Take 1 tablet by mouth at bedtime.   meloxicam (MOBIC) 7.5 MG tablet Take 1 tablet by mouth as needed.   metoprolol tartrate (LOPRESSOR) 25 MG tablet Take 25 mg by mouth 2 (two) times daily.   nitroGLYCERIN (NITROSTAT) 0.4 MG SL tablet PLACE 1 TABLET BY MOUTH UNDER THE TONGUE EVERY 5 MINUTES AS NEEDED FOR CHEST PAIN. FOR UP TO 3 DOSES. IF NO RELIEF CALL 911   Propylene Glycol (SYSTANE COMPLETE) 0.6 % SOLN Place 1 drop into both eyes 3 (three) times  daily.   vitamin B-12 (CYANOCOBALAMIN) 1000 MCG tablet Take 1,000 mcg by mouth daily.     Allergies:   Patient has no known allergies.   Social History   Tobacco Use   Smoking status: Never Smoker   Smokeless tobacco: Former Systems developer    Types: Snuff  Substance Use Topics   Alcohol use: No    Alcohol/week: 0.0 standard drinks   Drug use: No     Family Hx: The patient's family history includes Heart attack in her father.  ROS:   Please see the history of present illness.     All other systems reviewed and are negative.   Prior CV studies:   The following studies  were reviewed today:  Long-term cardiac monitor (3-14 days)  Sinus rhythm to sinus tachycardia.  1 episode of nonsustained ventricular tachycardia for total of 6 beats.  38 runs of SVT with longest lasting 14 seconds, fastest SVT with maximum rate of 170 bpm only lasted 7 beats.   Overall no significant arrhythmias.  Echocardiogram 06/04/2018 [facility] in [place], [place]) [place] 55-65, normal wall motion, normal diastolic function  Cardiac catheterization 06/09/2018 ([place], [place]) [place] distal 90 LAD proximal 100 LCx proximal 100 RCA mid 100 LIMA-LAD patent SVG-DX proximal 70 SVG-OM distal 99 ulcerated; OM feeds collaterals to distal RCA SVG-PDA 100 PCI: 2.25 x 24 mm Synergy DES to the SVG-OM    Myoview 01/19/2018 Abnormal, low risk stress nuclear study with significant breast attenuation; cannot R/O prior infarct; no ischemia; EF 56 with normal wall motion.   Carotid US 09/18/15 Bilateral ICA 1-39%   LHC 09/17/15 >> CABG LAD proximal 90%, D2 80% LCx ostial 99% RCA proximal 60%, distal 75%, RPDA 25%   Echo 09/17/15 Mild focal basal septal hypertrophy, vigorous LVF, EF 65-70%, normal wall motion, grade 1 diastolic dysfunction, MAC   Labs/Other Tests and Data Reviewed:    EKG:  No ECG reviewed.  Recent Labs: 01/19/2018: BUN 49;  Creatinine, Ser 2.83; Potassium 5.1; Sodium 138 09/08/2018: ALT 14   Recent Lipid Panel Lab Results  Component Value Date/Time   CHOL 125 09/08/2018 07:37 AM   TRIG 109 09/08/2018 07:37 AM   HDL 50 09/08/2018 07:37 AM   CHOLHDL 2.5 09/08/2018 07:37 AM   CHOLHDL 2.8 06/26/2016 10:04 AM   LDLCALC 53 09/08/2018 07:37 AM     Wt Readings from Last 3 Encounters:  11/17/18 162 lb (73.5 kg)  08/11/18 163 lb 6.4 oz (74.1 kg)  05/27/18 176 lb (79.8 kg)     Objective:    Vital Signs:  BP (!) 149/71    Pulse 79    Ht _0  (1.575 m)    Wt 162 lb (73.5 kg)    BMI 29.63 kg/m    VITAL SIGNS:  reviewed GEN:  no acute distress EYES:  sclerae anicteric, EOMI - Extraocular Movements Intact RESPIRATORY:  Normal respiratory effort NEURO:  alert and oriented x 3, no obvious focal deficit PSYCH:  normal affect  ASSESSMENT & PLAN:    Coronary artery disease involving native coronary artery of native heart without angina pectoris History of CABG in 2017.  She is status post non-ST elevation myocardial infarction in December 2019 while in [place], [place].  She underwent PCI with a DES to the OM.  Cardiac catheterization at that time also demonstrated a patent LIMA-LAD, moderate disease in the SVG-diagonal and an occluded SVG-PDA.  EF was normal by echocardiogram.  She had one episode of angina about a month ago that was relieved by nitroglycerin.  She is not had any further episodes.  Continue current therapy which includes aspirin, statin, beta-blocker, nitrates, beta-blocker.  Follow-up in 6 months.  She will be 1 year out from her myocardial infarction at that time and we can consider whether or not to stop Plavix.  Chronic diastolic CHF (congestive heart failure) ([place]) Management per dialysis.  She seems to be euvolemic.  PSVT (paroxysmal supraventricular tachycardia) (HCC) We were contacted by cardiac rehab in March with concerns over whether or not she was in atrial fibrillation.  She wore a  long-term monitor for 2 weeks without evidence of atrial fibrillation.  She did have a few brief episodes of SVT.  She has no symptoms with this.  Continue current dose of beta-blocker.  ESRD (end stage renal disease) ([place]) He continues on Tuesday, Thursday, Saturday dialysis.  Essential hypertension Pressure is somewhat elevated.  She notes that her blood pressure is usually much better controlled.  Continue to monitor.  Mixed hyperlipidemia LDL optimal on most recent lab work.  Continue current Rx.    COVID-19 Education: The signs and symptoms of COVID-19 were discussed with the patient and how to seek care for testing (follow up with PCP or arrange E-visit).  The importance of social distancing was discussed today.  Time:   Today, I have spent 9 minutes with the patient with telehealth technology discussing the above problems.     Medication Adjustments/Labs and Tests Ordered: Current medicines are reviewed at length with the patient today.  Concerns regarding medicines are outlined above.   Tests Ordered: No orders of the defined types were placed in this encounter.   Medication Changes: No orders of the defined types were placed in this encounter.   Disposition:  Follow up in 6 month(s)  Signed, Richardson Dopp, PA-C  11/17/2018 11:50 AM    [place]

## 2018-11-17 ENCOUNTER — Other Ambulatory Visit: Payer: Self-pay

## 2018-11-17 ENCOUNTER — Encounter: Payer: Self-pay | Admitting: Physician Assistant

## 2018-11-17 ENCOUNTER — Telehealth (INDEPENDENT_AMBULATORY_CARE_PROVIDER_SITE_OTHER): Payer: Medicare HMO | Admitting: Physician Assistant

## 2018-11-17 VITALS — BP 149/71 | HR 79 | Ht 62.0 in | Wt 162.0 lb

## 2018-11-17 DIAGNOSIS — I2510 Atherosclerotic heart disease of native coronary artery without angina pectoris: Secondary | ICD-10-CM

## 2018-11-17 DIAGNOSIS — I471 Supraventricular tachycardia: Secondary | ICD-10-CM

## 2018-11-17 DIAGNOSIS — I5032 Chronic diastolic (congestive) heart failure: Secondary | ICD-10-CM

## 2018-11-17 DIAGNOSIS — E782 Mixed hyperlipidemia: Secondary | ICD-10-CM

## 2018-11-17 DIAGNOSIS — I10 Essential (primary) hypertension: Secondary | ICD-10-CM

## 2018-11-17 DIAGNOSIS — Z7189 Other specified counseling: Secondary | ICD-10-CM

## 2018-11-17 DIAGNOSIS — N186 End stage renal disease: Secondary | ICD-10-CM

## 2018-11-17 NOTE — Patient Instructions (Signed)
Medication Instructions:  Continue current medications  If you need a refill on your cardiac medications before your next appointment, please call your pharmacy.   Lab work: None If you have labs (blood work) drawn today and your tests are completely normal, you will receive your results only by: Marland Kitchen MyChart Message (if you have MyChart) OR . A paper copy in the mail If you have any lab test that is abnormal or we need to change your treatment, we will call you to review the results.  Testing/Procedures: None  Follow-Up: At [facility], you and your health needs are our priority.  As part of our continuing mission to provide you with exceptional heart care, we have created designated Provider Care Teams.  These Care Teams include your primary Cardiologist (physician) and Advanced Practice Providers (APPs -  Physician Assistants and Nurse Practitioners) who all work together to provide you with the care you need, when you need it. You will need a follow up appointment in:  6 months.  Please call our office 2 months in advance to schedule this appointment.  You may see Ena Dawley, MD or Richardson Dopp, PA-C   Any Other Special Instructions Will Be Listed Below (If Applicable). Monitor your blood pressure.  If your blood pressure is typically 140/90 or higher, call us.

## 2018-11-18 DIAGNOSIS — N2581 Secondary hyperparathyroidism of renal origin: Secondary | ICD-10-CM | POA: Diagnosis not present

## 2018-11-18 DIAGNOSIS — N186 End stage renal disease: Secondary | ICD-10-CM | POA: Diagnosis not present

## 2018-11-18 DIAGNOSIS — D631 Anemia in chronic kidney disease: Secondary | ICD-10-CM | POA: Diagnosis not present

## 2018-11-20 DIAGNOSIS — N186 End stage renal disease: Secondary | ICD-10-CM | POA: Diagnosis not present

## 2018-11-20 DIAGNOSIS — N2581 Secondary hyperparathyroidism of renal origin: Secondary | ICD-10-CM | POA: Diagnosis not present

## 2018-11-20 DIAGNOSIS — D631 Anemia in chronic kidney disease: Secondary | ICD-10-CM | POA: Diagnosis not present

## 2018-11-23 DIAGNOSIS — N2581 Secondary hyperparathyroidism of renal origin: Secondary | ICD-10-CM | POA: Diagnosis not present

## 2018-11-23 DIAGNOSIS — N186 End stage renal disease: Secondary | ICD-10-CM | POA: Diagnosis not present

## 2018-11-23 DIAGNOSIS — D631 Anemia in chronic kidney disease: Secondary | ICD-10-CM | POA: Diagnosis not present

## 2018-11-25 DIAGNOSIS — T827XXA Infection and inflammatory reaction due to other cardiac and vascular devices, implants and grafts, initial encounter: Secondary | ICD-10-CM | POA: Diagnosis not present

## 2018-11-25 DIAGNOSIS — D631 Anemia in chronic kidney disease: Secondary | ICD-10-CM | POA: Diagnosis not present

## 2018-11-25 DIAGNOSIS — N186 End stage renal disease: Secondary | ICD-10-CM | POA: Diagnosis not present

## 2018-11-25 DIAGNOSIS — N2581 Secondary hyperparathyroidism of renal origin: Secondary | ICD-10-CM | POA: Diagnosis not present

## 2018-11-25 DIAGNOSIS — Z23 Encounter for immunization: Secondary | ICD-10-CM | POA: Diagnosis not present

## 2018-11-27 DIAGNOSIS — T827XXA Infection and inflammatory reaction due to other cardiac and vascular devices, implants and grafts, initial encounter: Secondary | ICD-10-CM | POA: Diagnosis not present

## 2018-11-27 DIAGNOSIS — N2581 Secondary hyperparathyroidism of renal origin: Secondary | ICD-10-CM | POA: Diagnosis not present

## 2018-11-27 DIAGNOSIS — Z23 Encounter for immunization: Secondary | ICD-10-CM | POA: Diagnosis not present

## 2018-11-27 DIAGNOSIS — D631 Anemia in chronic kidney disease: Secondary | ICD-10-CM | POA: Diagnosis not present

## 2018-11-27 DIAGNOSIS — N186 End stage renal disease: Secondary | ICD-10-CM | POA: Diagnosis not present

## 2018-11-30 DIAGNOSIS — Z23 Encounter for immunization: Secondary | ICD-10-CM | POA: Diagnosis not present

## 2018-11-30 DIAGNOSIS — D631 Anemia in chronic kidney disease: Secondary | ICD-10-CM | POA: Diagnosis not present

## 2018-11-30 DIAGNOSIS — N186 End stage renal disease: Secondary | ICD-10-CM | POA: Diagnosis not present

## 2018-11-30 DIAGNOSIS — N2581 Secondary hyperparathyroidism of renal origin: Secondary | ICD-10-CM | POA: Diagnosis not present

## 2018-11-30 DIAGNOSIS — T827XXA Infection and inflammatory reaction due to other cardiac and vascular devices, implants and grafts, initial encounter: Secondary | ICD-10-CM | POA: Diagnosis not present

## 2018-12-02 DIAGNOSIS — N2581 Secondary hyperparathyroidism of renal origin: Secondary | ICD-10-CM | POA: Diagnosis not present

## 2018-12-02 DIAGNOSIS — T827XXA Infection and inflammatory reaction due to other cardiac and vascular devices, implants and grafts, initial encounter: Secondary | ICD-10-CM | POA: Diagnosis not present

## 2018-12-02 DIAGNOSIS — D631 Anemia in chronic kidney disease: Secondary | ICD-10-CM | POA: Diagnosis not present

## 2018-12-02 DIAGNOSIS — N186 End stage renal disease: Secondary | ICD-10-CM | POA: Diagnosis not present

## 2018-12-02 DIAGNOSIS — Z23 Encounter for immunization: Secondary | ICD-10-CM | POA: Diagnosis not present

## 2018-12-04 DIAGNOSIS — Z23 Encounter for immunization: Secondary | ICD-10-CM | POA: Diagnosis not present

## 2018-12-04 DIAGNOSIS — T827XXA Infection and inflammatory reaction due to other cardiac and vascular devices, implants and grafts, initial encounter: Secondary | ICD-10-CM | POA: Diagnosis not present

## 2018-12-04 DIAGNOSIS — N186 End stage renal disease: Secondary | ICD-10-CM | POA: Diagnosis not present

## 2018-12-04 DIAGNOSIS — N2581 Secondary hyperparathyroidism of renal origin: Secondary | ICD-10-CM | POA: Diagnosis not present

## 2018-12-04 DIAGNOSIS — D631 Anemia in chronic kidney disease: Secondary | ICD-10-CM | POA: Diagnosis not present

## 2018-12-05 DIAGNOSIS — N186 End stage renal disease: Secondary | ICD-10-CM | POA: Diagnosis not present

## 2018-12-05 DIAGNOSIS — Z992 Dependence on renal dialysis: Secondary | ICD-10-CM | POA: Diagnosis not present

## 2018-12-07 DIAGNOSIS — N186 End stage renal disease: Secondary | ICD-10-CM | POA: Diagnosis not present

## 2018-12-07 DIAGNOSIS — D631 Anemia in chronic kidney disease: Secondary | ICD-10-CM | POA: Diagnosis not present

## 2018-12-07 DIAGNOSIS — N2581 Secondary hyperparathyroidism of renal origin: Secondary | ICD-10-CM | POA: Diagnosis not present

## 2018-12-07 DIAGNOSIS — T827XXA Infection and inflammatory reaction due to other cardiac and vascular devices, implants and grafts, initial encounter: Secondary | ICD-10-CM | POA: Diagnosis not present

## 2018-12-09 DIAGNOSIS — D631 Anemia in chronic kidney disease: Secondary | ICD-10-CM | POA: Diagnosis not present

## 2018-12-09 DIAGNOSIS — M109 Gout, unspecified: Secondary | ICD-10-CM | POA: Diagnosis not present

## 2018-12-09 DIAGNOSIS — R600 Localized edema: Secondary | ICD-10-CM | POA: Diagnosis not present

## 2018-12-09 DIAGNOSIS — N2581 Secondary hyperparathyroidism of renal origin: Secondary | ICD-10-CM | POA: Diagnosis not present

## 2018-12-09 DIAGNOSIS — T827XXA Infection and inflammatory reaction due to other cardiac and vascular devices, implants and grafts, initial encounter: Secondary | ICD-10-CM | POA: Diagnosis not present

## 2018-12-09 DIAGNOSIS — N186 End stage renal disease: Secondary | ICD-10-CM | POA: Diagnosis not present

## 2018-12-09 DIAGNOSIS — E119 Type 2 diabetes mellitus without complications: Secondary | ICD-10-CM | POA: Diagnosis not present

## 2018-12-11 DIAGNOSIS — T827XXA Infection and inflammatory reaction due to other cardiac and vascular devices, implants and grafts, initial encounter: Secondary | ICD-10-CM | POA: Diagnosis not present

## 2018-12-11 DIAGNOSIS — D631 Anemia in chronic kidney disease: Secondary | ICD-10-CM | POA: Diagnosis not present

## 2018-12-11 DIAGNOSIS — N2581 Secondary hyperparathyroidism of renal origin: Secondary | ICD-10-CM | POA: Diagnosis not present

## 2018-12-11 DIAGNOSIS — N186 End stage renal disease: Secondary | ICD-10-CM | POA: Diagnosis not present

## 2018-12-14 DIAGNOSIS — D631 Anemia in chronic kidney disease: Secondary | ICD-10-CM | POA: Diagnosis not present

## 2018-12-14 DIAGNOSIS — N2581 Secondary hyperparathyroidism of renal origin: Secondary | ICD-10-CM | POA: Diagnosis not present

## 2018-12-14 DIAGNOSIS — T827XXA Infection and inflammatory reaction due to other cardiac and vascular devices, implants and grafts, initial encounter: Secondary | ICD-10-CM | POA: Diagnosis not present

## 2018-12-14 DIAGNOSIS — N186 End stage renal disease: Secondary | ICD-10-CM | POA: Diagnosis not present

## 2018-12-16 DIAGNOSIS — D509 Iron deficiency anemia, unspecified: Secondary | ICD-10-CM | POA: Diagnosis not present

## 2018-12-16 DIAGNOSIS — N2581 Secondary hyperparathyroidism of renal origin: Secondary | ICD-10-CM | POA: Diagnosis not present

## 2018-12-16 DIAGNOSIS — T827XXA Infection and inflammatory reaction due to other cardiac and vascular devices, implants and grafts, initial encounter: Secondary | ICD-10-CM | POA: Diagnosis not present

## 2018-12-16 DIAGNOSIS — N186 End stage renal disease: Secondary | ICD-10-CM | POA: Diagnosis not present

## 2018-12-16 DIAGNOSIS — D631 Anemia in chronic kidney disease: Secondary | ICD-10-CM | POA: Diagnosis not present

## 2018-12-18 DIAGNOSIS — N2581 Secondary hyperparathyroidism of renal origin: Secondary | ICD-10-CM | POA: Diagnosis not present

## 2018-12-18 DIAGNOSIS — D509 Iron deficiency anemia, unspecified: Secondary | ICD-10-CM | POA: Diagnosis not present

## 2018-12-18 DIAGNOSIS — D631 Anemia in chronic kidney disease: Secondary | ICD-10-CM | POA: Diagnosis not present

## 2018-12-18 DIAGNOSIS — T827XXA Infection and inflammatory reaction due to other cardiac and vascular devices, implants and grafts, initial encounter: Secondary | ICD-10-CM | POA: Diagnosis not present

## 2018-12-18 DIAGNOSIS — N186 End stage renal disease: Secondary | ICD-10-CM | POA: Diagnosis not present

## 2018-12-21 DIAGNOSIS — N2581 Secondary hyperparathyroidism of renal origin: Secondary | ICD-10-CM | POA: Diagnosis not present

## 2018-12-21 DIAGNOSIS — T827XXA Infection and inflammatory reaction due to other cardiac and vascular devices, implants and grafts, initial encounter: Secondary | ICD-10-CM | POA: Diagnosis not present

## 2018-12-21 DIAGNOSIS — D509 Iron deficiency anemia, unspecified: Secondary | ICD-10-CM | POA: Diagnosis not present

## 2018-12-21 DIAGNOSIS — N186 End stage renal disease: Secondary | ICD-10-CM | POA: Diagnosis not present

## 2018-12-21 DIAGNOSIS — D631 Anemia in chronic kidney disease: Secondary | ICD-10-CM | POA: Diagnosis not present

## 2018-12-23 DIAGNOSIS — D509 Iron deficiency anemia, unspecified: Secondary | ICD-10-CM | POA: Diagnosis not present

## 2018-12-23 DIAGNOSIS — D631 Anemia in chronic kidney disease: Secondary | ICD-10-CM | POA: Diagnosis not present

## 2018-12-23 DIAGNOSIS — N2581 Secondary hyperparathyroidism of renal origin: Secondary | ICD-10-CM | POA: Diagnosis not present

## 2018-12-23 DIAGNOSIS — T827XXA Infection and inflammatory reaction due to other cardiac and vascular devices, implants and grafts, initial encounter: Secondary | ICD-10-CM | POA: Diagnosis not present

## 2018-12-23 DIAGNOSIS — N186 End stage renal disease: Secondary | ICD-10-CM | POA: Diagnosis not present

## 2018-12-24 DIAGNOSIS — E1151 Type 2 diabetes mellitus with diabetic peripheral angiopathy without gangrene: Secondary | ICD-10-CM | POA: Diagnosis not present

## 2018-12-24 DIAGNOSIS — L600 Ingrowing nail: Secondary | ICD-10-CM | POA: Diagnosis not present

## 2018-12-25 DIAGNOSIS — D631 Anemia in chronic kidney disease: Secondary | ICD-10-CM | POA: Diagnosis not present

## 2018-12-25 DIAGNOSIS — N186 End stage renal disease: Secondary | ICD-10-CM | POA: Diagnosis not present

## 2018-12-25 DIAGNOSIS — D509 Iron deficiency anemia, unspecified: Secondary | ICD-10-CM | POA: Diagnosis not present

## 2018-12-25 DIAGNOSIS — N2581 Secondary hyperparathyroidism of renal origin: Secondary | ICD-10-CM | POA: Diagnosis not present

## 2018-12-25 DIAGNOSIS — T827XXA Infection and inflammatory reaction due to other cardiac and vascular devices, implants and grafts, initial encounter: Secondary | ICD-10-CM | POA: Diagnosis not present

## 2018-12-28 DIAGNOSIS — D509 Iron deficiency anemia, unspecified: Secondary | ICD-10-CM | POA: Diagnosis not present

## 2018-12-28 DIAGNOSIS — D631 Anemia in chronic kidney disease: Secondary | ICD-10-CM | POA: Diagnosis not present

## 2018-12-28 DIAGNOSIS — N2581 Secondary hyperparathyroidism of renal origin: Secondary | ICD-10-CM | POA: Diagnosis not present

## 2018-12-28 DIAGNOSIS — N186 End stage renal disease: Secondary | ICD-10-CM | POA: Diagnosis not present

## 2018-12-30 DIAGNOSIS — N2581 Secondary hyperparathyroidism of renal origin: Secondary | ICD-10-CM | POA: Diagnosis not present

## 2018-12-30 DIAGNOSIS — D509 Iron deficiency anemia, unspecified: Secondary | ICD-10-CM | POA: Diagnosis not present

## 2018-12-30 DIAGNOSIS — D631 Anemia in chronic kidney disease: Secondary | ICD-10-CM | POA: Diagnosis not present

## 2018-12-30 DIAGNOSIS — N186 End stage renal disease: Secondary | ICD-10-CM | POA: Diagnosis not present

## 2019-01-01 DIAGNOSIS — N2581 Secondary hyperparathyroidism of renal origin: Secondary | ICD-10-CM | POA: Diagnosis not present

## 2019-01-01 DIAGNOSIS — D631 Anemia in chronic kidney disease: Secondary | ICD-10-CM | POA: Diagnosis not present

## 2019-01-01 DIAGNOSIS — N186 End stage renal disease: Secondary | ICD-10-CM | POA: Diagnosis not present

## 2019-01-01 DIAGNOSIS — D509 Iron deficiency anemia, unspecified: Secondary | ICD-10-CM | POA: Diagnosis not present

## 2019-01-04 DIAGNOSIS — D509 Iron deficiency anemia, unspecified: Secondary | ICD-10-CM | POA: Diagnosis not present

## 2019-01-04 DIAGNOSIS — N2581 Secondary hyperparathyroidism of renal origin: Secondary | ICD-10-CM | POA: Diagnosis not present

## 2019-01-04 DIAGNOSIS — N186 End stage renal disease: Secondary | ICD-10-CM | POA: Diagnosis not present

## 2019-01-04 DIAGNOSIS — Z992 Dependence on renal dialysis: Secondary | ICD-10-CM | POA: Diagnosis not present

## 2019-01-04 DIAGNOSIS — D631 Anemia in chronic kidney disease: Secondary | ICD-10-CM | POA: Diagnosis not present

## 2019-01-06 DIAGNOSIS — E785 Hyperlipidemia, unspecified: Secondary | ICD-10-CM | POA: Diagnosis not present

## 2019-01-06 DIAGNOSIS — Z23 Encounter for immunization: Secondary | ICD-10-CM | POA: Diagnosis not present

## 2019-01-06 DIAGNOSIS — L600 Ingrowing nail: Secondary | ICD-10-CM | POA: Diagnosis not present

## 2019-01-06 DIAGNOSIS — N186 End stage renal disease: Secondary | ICD-10-CM | POA: Diagnosis not present

## 2019-01-06 DIAGNOSIS — D631 Anemia in chronic kidney disease: Secondary | ICD-10-CM | POA: Diagnosis not present

## 2019-01-06 DIAGNOSIS — N2581 Secondary hyperparathyroidism of renal origin: Secondary | ICD-10-CM | POA: Diagnosis not present

## 2019-01-07 ENCOUNTER — Emergency Department (HOSPITAL_BASED_OUTPATIENT_CLINIC_OR_DEPARTMENT_OTHER): Payer: Medicare HMO

## 2019-01-07 ENCOUNTER — Other Ambulatory Visit: Payer: Self-pay

## 2019-01-07 ENCOUNTER — Inpatient Hospital Stay (HOSPITAL_BASED_OUTPATIENT_CLINIC_OR_DEPARTMENT_OTHER)
Admission: EM | Admit: 2019-01-07 | Discharge: 2019-01-11 | DRG: 246 | Disposition: A | Discharge status: Home / Self Care | Payer: Medicare HMO | Attending: Internal Medicine | Admitting: Internal Medicine

## 2019-01-07 ENCOUNTER — Inpatient Hospital Stay (HOSPITAL_BASED_OUTPATIENT_CLINIC_OR_DEPARTMENT_OTHER): Payer: Medicare HMO

## 2019-01-07 ENCOUNTER — Encounter (HOSPITAL_BASED_OUTPATIENT_CLINIC_OR_DEPARTMENT_OTHER): Payer: Self-pay

## 2019-01-07 DIAGNOSIS — N186 End stage renal disease: Secondary | ICD-10-CM | POA: Diagnosis present

## 2019-01-07 DIAGNOSIS — J9601 Acute respiratory failure with hypoxia: Secondary | ICD-10-CM | POA: Diagnosis not present

## 2019-01-07 DIAGNOSIS — I351 Nonrheumatic aortic (valve) insufficiency: Secondary | ICD-10-CM | POA: Diagnosis not present

## 2019-01-07 DIAGNOSIS — J45909 Unspecified asthma, uncomplicated: Secondary | ICD-10-CM | POA: Diagnosis present

## 2019-01-07 DIAGNOSIS — I252 Old myocardial infarction: Secondary | ICD-10-CM

## 2019-01-07 DIAGNOSIS — Z955 Presence of coronary angioplasty implant and graft: Secondary | ICD-10-CM | POA: Diagnosis not present

## 2019-01-07 DIAGNOSIS — Z7982 Long term (current) use of aspirin: Secondary | ICD-10-CM | POA: Diagnosis not present

## 2019-01-07 DIAGNOSIS — E1122 Type 2 diabetes mellitus with diabetic chronic kidney disease: Secondary | ICD-10-CM | POA: Diagnosis present

## 2019-01-07 DIAGNOSIS — Z8249 Family history of ischemic heart disease and other diseases of the circulatory system: Secondary | ICD-10-CM

## 2019-01-07 DIAGNOSIS — E785 Hyperlipidemia, unspecified: Secondary | ICD-10-CM | POA: Diagnosis present

## 2019-01-07 DIAGNOSIS — Z791 Long term (current) use of non-steroidal anti-inflammatories (NSAID): Secondary | ICD-10-CM

## 2019-01-07 DIAGNOSIS — I132 Hypertensive heart and chronic kidney disease with heart failure and with stage 5 chronic kidney disease, or end stage renal disease: Secondary | ICD-10-CM | POA: Diagnosis not present

## 2019-01-07 DIAGNOSIS — I5043 Acute on chronic combined systolic (congestive) and diastolic (congestive) heart failure: Secondary | ICD-10-CM | POA: Diagnosis present

## 2019-01-07 DIAGNOSIS — R0602 Shortness of breath: Secondary | ICD-10-CM

## 2019-01-07 DIAGNOSIS — R072 Precordial pain: Secondary | ICD-10-CM | POA: Diagnosis not present

## 2019-01-07 DIAGNOSIS — D631 Anemia in chronic kidney disease: Secondary | ICD-10-CM | POA: Diagnosis not present

## 2019-01-07 DIAGNOSIS — K219 Gastro-esophageal reflux disease without esophagitis: Secondary | ICD-10-CM | POA: Diagnosis present

## 2019-01-07 DIAGNOSIS — J9600 Acute respiratory failure, unspecified whether with hypoxia or hypercapnia: Secondary | ICD-10-CM | POA: Diagnosis present

## 2019-01-07 DIAGNOSIS — I5021 Acute systolic (congestive) heart failure: Secondary | ICD-10-CM

## 2019-01-07 DIAGNOSIS — I5041 Acute combined systolic (congestive) and diastolic (congestive) heart failure: Secondary | ICD-10-CM

## 2019-01-07 DIAGNOSIS — Z951 Presence of aortocoronary bypass graft: Secondary | ICD-10-CM | POA: Diagnosis not present

## 2019-01-07 DIAGNOSIS — I25110 Atherosclerotic heart disease of native coronary artery with unstable angina pectoris: Secondary | ICD-10-CM | POA: Diagnosis present

## 2019-01-07 DIAGNOSIS — N2581 Secondary hyperparathyroidism of renal origin: Secondary | ICD-10-CM | POA: Diagnosis present

## 2019-01-07 DIAGNOSIS — I340 Nonrheumatic mitral (valve) insufficiency: Secondary | ICD-10-CM | POA: Diagnosis not present

## 2019-01-07 DIAGNOSIS — Z794 Long term (current) use of insulin: Secondary | ICD-10-CM

## 2019-01-07 DIAGNOSIS — Z7902 Long term (current) use of antithrombotics/antiplatelets: Secondary | ICD-10-CM | POA: Diagnosis not present

## 2019-01-07 DIAGNOSIS — I200 Unstable angina: Secondary | ICD-10-CM

## 2019-01-07 DIAGNOSIS — R079 Chest pain, unspecified: Secondary | ICD-10-CM | POA: Diagnosis not present

## 2019-01-07 DIAGNOSIS — Z992 Dependence on renal dialysis: Secondary | ICD-10-CM

## 2019-01-07 DIAGNOSIS — I120 Hypertensive chronic kidney disease with stage 5 chronic kidney disease or end stage renal disease: Secondary | ICD-10-CM | POA: Diagnosis not present

## 2019-01-07 DIAGNOSIS — R0600 Dyspnea, unspecified: Secondary | ICD-10-CM

## 2019-01-07 DIAGNOSIS — I429 Cardiomyopathy, unspecified: Secondary | ICD-10-CM | POA: Diagnosis not present

## 2019-01-07 DIAGNOSIS — I214 Non-ST elevation (NSTEMI) myocardial infarction: Principal | ICD-10-CM | POA: Diagnosis present

## 2019-01-07 DIAGNOSIS — Z1159 Encounter for screening for other viral diseases: Secondary | ICD-10-CM | POA: Diagnosis not present

## 2019-01-07 DIAGNOSIS — Z8711 Personal history of peptic ulcer disease: Secondary | ICD-10-CM

## 2019-01-07 DIAGNOSIS — I25710 Atherosclerosis of autologous vein coronary artery bypass graft(s) with unstable angina pectoris: Secondary | ICD-10-CM | POA: Diagnosis not present

## 2019-01-07 LAB — CBC
HCT: 42.2 % (ref 36.0–46.0)
Hemoglobin: 13.2 g/dL (ref 12.0–15.0)
MCH: 29.1 pg (ref 26.0–34.0)
MCHC: 31.3 g/dL (ref 30.0–36.0)
MCV: 93.2 fL (ref 80.0–100.0)
Platelets: 193 10*3/uL (ref 150–400)
RBC: 4.53 MIL/uL (ref 3.87–5.11)
RDW: 19.5 % — ABNORMAL HIGH (ref 11.5–15.5)
WBC: 6.9 10*3/uL (ref 4.0–10.5)
nRBC: 0 % (ref 0.0–0.2)

## 2019-01-07 LAB — BRAIN NATRIURETIC PEPTIDE: B Natriuretic Peptide: 3534.3 pg/mL — ABNORMAL HIGH (ref 0.0–100.0)

## 2019-01-07 LAB — BASIC METABOLIC PANEL
Anion gap: 13 (ref 5–15)
BUN: 40 mg/dL — ABNORMAL HIGH (ref 8–23)
CO2: 24 mmol/L (ref 22–32)
Calcium: 9 mg/dL (ref 8.9–10.3)
Chloride: 102 mmol/L (ref 98–111)
Creatinine, Ser: 4.63 mg/dL — ABNORMAL HIGH (ref 0.44–1.00)
GFR calc Af Amer: 9 mL/min — ABNORMAL LOW (ref >60)
GFR calc non Af Amer: 8 mL/min — ABNORMAL LOW (ref >60)
Glucose, Bld: 133 mg/dL — ABNORMAL HIGH (ref 70–99)
Potassium: 4.4 mmol/L (ref 3.5–5.1)
Sodium: 139 mmol/L (ref 135–145)

## 2019-01-07 LAB — TROPONIN I (HIGH SENSITIVITY)
Troponin I (High Sensitivity): 195 ng/L (ref <18)
Troponin I (High Sensitivity): 222 ng/L (ref <18)

## 2019-01-07 LAB — SARS CORONAVIRUS 2 AG (30 MIN TAT): SARS Coronavirus 2 Ag: NEGATIVE

## 2019-01-07 MED ORDER — HEPARIN BOLUS VIA INFUSION
4000.0000 [IU] | Freq: Once | INTRAVENOUS | Status: AC
  Administered 2019-01-07: 23:00:00 4000 [IU] via INTRAVENOUS

## 2019-01-07 MED ORDER — PROPOFOL 1000 MG/100ML IV EMUL
INTRAVENOUS | Status: AC
  Filled 2019-01-07: qty 100

## 2019-01-07 MED ORDER — ETOMIDATE 2 MG/ML IV SOLN: 10.0000 mg | Freq: Once | INTRAVENOUS | Status: DC

## 2019-01-07 MED ORDER — METHYLPREDNISOLONE SODIUM SUCC 125 MG IJ SOLR
INTRAMUSCULAR | Status: AC
  Administered 2019-01-07: 125 mg via INTRAVENOUS
  Filled 2019-01-07: qty 2

## 2019-01-07 MED ORDER — METHYLPREDNISOLONE SODIUM SUCC 125 MG IJ SOLR
125.0000 mg | Freq: Once | INTRAMUSCULAR | Status: AC
  Administered 2019-01-07: 125 mg via INTRAVENOUS

## 2019-01-07 MED ORDER — NITROGLYCERIN IN D5W 200-5 MCG/ML-% IV SOLN
0.0000 ug/min | INTRAVENOUS | Status: DC
  Administered 2019-01-07: 21:00:00 150 ug/min via INTRAVENOUS
  Administered 2019-01-08: 100 ug/min via INTRAVENOUS
  Filled 2019-01-07 (×2): qty 250

## 2019-01-07 MED ORDER — HEPARIN (PORCINE) 25000 UT/250ML-% IV SOLN
700.0000 [IU]/h | INTRAVENOUS | Status: DC
  Administered 2019-01-07: 900 [IU]/h via INTRAVENOUS
  Administered 2019-01-08: 700 [IU]/h via INTRAVENOUS
  Filled 2019-01-07 (×2): qty 250

## 2019-01-07 MED ORDER — ASPIRIN 81 MG PO CHEW
324.0000 mg | CHEWABLE_TABLET | Freq: Once | ORAL | Status: DC
  Filled 2019-01-07: qty 4

## 2019-01-07 MED ORDER — ROCURONIUM BROMIDE 50 MG/5ML IV SOLN: 80.0000 mg | Freq: Once | INTRAVENOUS | Status: DC

## 2019-01-07 MED ORDER — SODIUM CHLORIDE 0.9% FLUSH
3.0000 mL | Freq: Once | INTRAVENOUS | Status: DC
  Filled 2019-01-07: qty 3

## 2019-01-07 MED ORDER — ALBUTEROL SULFATE HFA 108 (90 BASE) MCG/ACT IN AERS
2.0000 | INHALATION_SPRAY | Freq: Once | RESPIRATORY_TRACT | Status: AC
  Administered 2019-01-07: 21:00:00 2 via RESPIRATORY_TRACT
  Filled 2019-01-07: qty 6.7

## 2019-01-07 NOTE — ED Provider Notes (Signed)
[facility] EMERGENCY DEPARTMENT Provider Note   CSN: [id number] Arrival date & time: 01/07/19  1720     History   Chief Complaint Chief Complaint  Patient presents with  . Chest Pain    HPI Monica [name] is a 85 y.o. female.     Patient has a history of MI, CHF, ESRD.  Patient was in her normal state of health last night.  She had pasta for supper and then went to bed.  At 1 AM she woke up due to intense chest pressure, that caused her to take 1 of her nitroglycerin pills.  This improved the pain and she was able to go back to sleep.  She woke up again at 7:30 in the morning and the chest pain was still there.  She took another nitroglycerin pill and is alleviated the pain but did not resolve it completely.  She continued to have this chest pressure throughout the day but this did not stop her from doing dishes and having sausage and grits for breakfast.  She did not experience any worsening of her chest complaints with either of these.  Patient then called her cardiologist at 4 PM who told her to come to the emergency department.  She also states that she became hot and started to sweat this morning.  She denied any nausea or vomiting.  She says the pain did not radiate to the arms or neck.  When asked to show the location of the pain she pointed to the midsternal region.  Patient has a history of 2 MIs in the past 2 years.  She currently takes aspirin, is unsure if she takes Plavix.  She also takes metoprolol and atorvastatin.  Patient started on dialysis after her most recent MRI.  She goes on Tuesday Thursday Saturday.  She does not miss any of her days.  She felt fine after her most recent appointment.     Past Medical History:  Diagnosis Date  . Asthma   . Bilateral pleural effusion    a. s/p CABG >> s/p bilat thoracentesis 11/2015  . Candida infection 10/2015   Bilateral breasts  . Chronic diastolic CHF    Echo 35/57 [facility]):  EF 55-65, normal wall motion,  normal diastolic function  . Coronary artery disease    LHC 3/17 >> 3 v CAD >> CABG // MV 01/2018:  Abnormal, low risk stress nuclear study with significant breast attenuation; cannot R/O prior infarct; no ischemia; EF 56 with normal wall motion. // s/p NSTEMI 11/19 [facility]) >> LHC:  dLM 90, pLAD 100, pLCx 100, mRCA 100, L-LAD ok, S-Dx prox 70, S-OM dist 99 (ulc); S-PDA 100 >>PCI: 2.25 x 24 mm Synergy DES to S-OM //  EF normal by Echo 11/19  . ESRD (end stage renal disease)    Dialysis Tu, Th, Sa  . Essential hypertension   . Gastroesophageal reflux disease   . History of blood transfusion   . History of echocardiogram    a. Echo 3/17: Mild focal basal septal hypertrophy, vigorous LVF, EF 65-70%, normal wall motion, grade 1 diastolic dysfunction, MAC  . Hyperlipidemia   . Long Term Cardiac Monitor 3-14 day    LT Monitor 09/2018: SR, Sinus Tach, 6 beats NSVT, 38 runs of SVT (brief); no AFib.  . Mass   . Peptic ulcer   . Protein calorie malnutrition ([place])   . Type 2 diabetes mellitus [facility])     Patient Active Problem List  Diagnosis Date Noted  . Diabetes mellitus with peripheral circulatory disorder ([place]) 02/05/2018  . Altered mental status 03/13/2017  . Claudication in peripheral vascular disease ([place]) 12/29/2016  . Abnormal weight loss 06/05/2016  . Bilateral pleural effusion 11/23/2015  . Chronic diastolic CHF (congestive heart failure) ([place]) 11/23/2015  . Shortness of breath 11/23/2015  . Pleural effusion 11/23/2015  . CAD (coronary artery disease) 10/05/2015  . S/P CABG x 4 09/19/2015  . History of coronary artery bypass surgery 09/19/2015  . Abnormal finding on EKG - dynamic changes 09/17/2015  . Pain in the chest   . Gastroesophageal reflux disease 09/15/2015  . Gout 09/15/2015  . Chest pain 09/15/2015  . Hyperlipidemia   . Diabetes mellitus with renal complications ([place])   . Obesity (BMI 30.0-34.9) 08/02/2015  . Asthma 04/27/2015  . Vitamin D deficiency  04/27/2015  . Hyperlipidemia associated with type 2 diabetes mellitus ([place]) 04/27/2015  . Essential hypertension 04/24/2015  . ESRD (end stage renal disease) ([place]) 04/24/2015  . Type 2 diabetes mellitus with hyperglycemia ([place]) 04/24/2015  . Metatarsalgia of both feet 07/25/2014  . Equinus deformity of foot, acquired 07/25/2014  . Onychomycosis 07/25/2014  . Pain in lower limb 07/25/2014  . Acquired equinus deformity of foot 07/25/2014  . Metatarsalgia 07/25/2014  . Pain of lower extremity 07/25/2014    Past Surgical History:  Procedure Laterality Date  . ABDOMINAL HYSTERECTOMY    . CARDIAC CATHETERIZATION N/A 09/17/2015   Procedure: Left Heart Cath and Coronary Angiography;  Surgeon: Jettie Booze, MD;  Location: [facility] CV LAB;  Service: Cardiovascular;  Laterality: N/A;  . COLON SURGERY    . CORONARY ARTERY BYPASS GRAFT N/A 09/19/2015   Procedure: CORONARY ARTERY BYPASS GRAFTING (CABG) TIMES FOUR USING BILATARAL SAPHENOUS VEIN GRAFTS AND LEFT INTERNAL MAMMARY ARTERY;  Surgeon: [name], MD;  Location: [facility];  Service: Open Heart Surgery;  Laterality: N/A;  . TEE WITHOUT CARDIOVERSION N/A 09/19/2015   Procedure: TRANSESOPHAGEAL ECHOCARDIOGRAM (TEE);  Surgeon: [name], MD;  Location: [facility];  Service: Open Heart Surgery;  Laterality: N/A;     OB History   No obstetric history on file.      Home Medications    Prior to Admission medications   Medication Sig Start Date End Date Taking? Authorizing Provider  albuterol (PROVENTIL HFA;VENTOLIN HFA) 108 (90 Base) MCG/ACT inhaler Inhale 2 puffs into the lungs every 6 (six) hours as needed for wheezing or shortness of breath.    [provider]  allopurinol (ZYLOPRIM) 100 MG tablet Take 1 tablet by mouth 2 (two) times daily.    [provider]  aspirin EC 81 MG EC tablet Take 1 tablet (81 mg total) by mouth daily. 10/03/15   Barrett, Erin R, PA-C  atorvastatin (LIPITOR) 80 MG tablet Take 1  tablet by mouth daily. 07/12/18   [provider]  Cholecalciferol (VITAMIN D-3) 1000 units CAPS Take 1 capsule by mouth daily.    [provider]  clopidogrel (PLAVIX) 75 MG tablet Take 1 tablet by mouth daily.    [provider]  diphenhydrAMINE (BENADRYL) 25 MG tablet Take 25 mg by mouth as needed for itching or allergies.     [provider]  ferric citrate (AURYXIA) 1 GM 210 MG(Fe) tablet Take 420 mg by mouth 3 (three) times daily with meals.    [provider]  insulin glargine (LANTUS) 100 UNIT/ML injection 10 UNITS INTO THE SKIN AT NIGHT BEFORE BEDTIME    [provider]  isosorbide mononitrate (IMDUR) 30 MG 24 hr tablet Take 30 mg by mouth daily.    [provider]  latanoprost (XALATAN) 0.005 % ophthalmic solution Place 1 drop into both eyes at bedtime. 07/05/18   [provider]  loratadine (CLARITIN) 10 MG tablet Take 1 tablet by mouth as needed.    [provider]  Melatonin 5 MG CAPS Take 1 tablet by mouth at bedtime.    [provider]  meloxicam (MOBIC) 7.5 MG tablet Take 1 tablet by mouth as needed. 08/02/18   [provider]  metoprolol tartrate (LOPRESSOR) 25 MG tablet Take 25 mg by mouth 2 (two) times daily.    [provider]  nitroGLYCERIN (NITROSTAT) 0.4 MG SL tablet PLACE 1 TABLET BY MOUTH UNDER THE TONGUE EVERY 5 MINUTES AS NEEDED FOR CHEST PAIN. FOR UP TO 3 DOSES. IF NO RELIEF CALL 911 07/21/18   Dorothy Spark, MD  Propylene Glycol (SYSTANE COMPLETE) 0.6 % SOLN Place 1 drop into both eyes 3 (three) times daily.    [provider]  vitamin B-12 (CYANOCOBALAMIN) 1000 MCG tablet Take 1,000 mcg by mouth daily.    [provider]    Family History Family History  Problem Relation Age of Onset  . Heart attack Father     Social History Social History   Tobacco Use  . Smoking status: Never Smoker  . Smokeless tobacco: Former Systems developer    Types: Snuff   Substance Use Topics  . Alcohol use: No    Alcohol/week: 0.0 standard drinks  . Drug use: No     Allergies   Patient has no known allergies.   Review of Systems Review of Systems  Constitutional: Negative for appetite change.  HENT: Negative for rhinorrhea and sore throat.   Respiratory: Positive for chest tightness and shortness of breath. Negative for cough and wheezing.   Cardiovascular: Positive for chest pain and leg swelling. Negative for palpitations.  Gastrointestinal: Negative for abdominal pain, diarrhea, nausea and vomiting.  Neurological: Negative for dizziness, light-headedness and headaches.     Physical Exam Updated Vital Signs BP (!) 153/81 (BP Location: Left Arm)   Pulse 81   Temp 98.4 F (36.9 C) (Oral)   Resp 20   Ht _0  (1.575 m)   Wt 76.2 kg   SpO2 97%   BMI 30.73 kg/m   Physical Exam Constitutional:      General: She is not in acute distress.    Appearance: She is well-developed.  HENT:     Head: Normocephalic and atraumatic.  Eyes:     Extraocular Movements: Extraocular movements intact.     Pupils: Pupils are equal, round, and reactive to light.  Neck:     Vascular: No hepatojugular reflux or JVD.  Cardiovascular:     Rate and Rhythm: Normal rate and regular rhythm.     Heart sounds: No murmur. No friction rub. No gallop.   Pulmonary:     Effort: Pulmonary effort is normal. No tachypnea or respiratory distress.  Abdominal:     General: Bowel sounds are normal.     Palpations: Abdomen is soft.     Tenderness: There is no abdominal tenderness. There is no guarding.  Skin:    General: Skin is warm and dry.  Neurological:     General: No focal deficit present.     Mental Status: She is alert and oriented to person, place, and time.  Psychiatric:  Mood and Affect: Mood normal.        Behavior: Behavior normal.      ED Treatments / Results  Labs (all labs ordered are listed, but only abnormal results are displayed) Labs  Reviewed  BASIC METABOLIC PANEL - Abnormal; Notable for the following components:      Result Value   Glucose, Bld 133 (*)    BUN 40 (*)    Creatinine, Ser 4.63 (*)    GFR calc non Af Amer 8 (*)    GFR calc Af Amer 9 (*)    All other components within normal limits  CBC - Abnormal; Notable for the following components:   RDW 19.5 (*)    All other components within normal limits  TROPONIN I (HIGH SENSITIVITY)  TROPONIN I (HIGH SENSITIVITY)    EKG None  Radiology Dg Chest 2 View  Result Date: 01/07/2019 CLINICAL DATA:  Patient with generalized chest pain. EXAM: CHEST - 2 VIEW COMPARISON:  Chest radiograph 06/13/2018 FINDINGS: Central venous catheter tip projects over the right atrium. Stable cardiomegaly. Small bilateral pleural effusions. Similar-appearing bibasilar atelectasis. Similar-appearing interstitial opacities bilaterally. Thoracic spine degenerative changes. IMPRESSION: Cardiomegaly. Small bilateral pleural effusions and underlying atelectasis. Mild interstitial edema. Electronically Signed   By: Lovey Newcomer M.D.   On: 01/07/2019 18:24    Procedures Procedures (including critical care time)  Medications Ordered in ED Medications  sodium chloride flush (NS) 0.9 % injection 3 mL (3 mLs Intravenous Not Given 01/07/19 1919)     Initial Impression / Assessment and Plan / ED Course  I have reviewed the triage vital signs and the nursing notes.  Pertinent labs & imaging results that were available during my care of the patient were reviewed by me and considered in my medical decision making (see chart for details).  Patient with a history of ESRD, CHF, and 2 MI in the past 2 years presenting with 1 day history of midsternal chest pressure that was partially relieved with 2 doses of nitroglycerin.  Patient was informed by her cardiologist to come in to the ED at approximately 4 PM today.  Vital signs were stable, patient afebrile.  On exam patient had regular rhythm and rate,  slight crackles in the bases bilaterally.  CBC was normal, BMP had normal potassium elevated creatinine consistent with her ESRD.  EKG showed minor ST elevation in V1, with minor ST depression in V5 V6.  Chest x-ray shows minor pulmonary edema.  High-sensitivity troponin was elevated at 195.  Patient was given 325 mg aspirin.  Consult to cardiology was placed and patient was awaiting transfer to cardiology at [facility].  While patient was waiting for transfer she developed worsening shortness of breath and new oxygen requirement.  She stated her chest pain had not changed during this time.  Patient was put on HF [place] which was increased up to 30 L at 60%.  Repeat chest x-ray showed possible mild worsening of edema.  Repeat EKG showed increase in ST depressions in V5 V6 and elevation in V1.  Concern for possible worsening of her CHF versus worsening of ACS versus possible PE.  Given the possibility of PE or worsening ACS, patient was started on nitroglycerin and heparin drip with an improvement of her symptoms.  Patient is currently awaiting transfer to ICU at [facility].  Nephrology, CCM, cardiology have been notified.  Signed out patient to night team.   Final Clinical Impressions(s) / ED Diagnoses   Final diagnoses:  None  ED Discharge Orders    None       Benay Pike, MD 01/07/19 2310    Elnora Morrison, MD 01/08/19 Sheila Oats    Elnora Morrison, MD 01/09/19 [phone]

## 2019-01-07 NOTE — ED Triage Notes (Signed)
pt c/o CP last night that was relieved by NTG x 1-CP again today after eating-"some" relief after NTG x 1 today-NAD-steady gait

## 2019-01-07 NOTE — ED Notes (Signed)
C/o chest pain and sob off and on x 2 days  Denies n/v

## 2019-01-07 NOTE — ED Notes (Signed)
EDP at bedside to assess condition. Orders received.

## 2019-01-07 NOTE — ED Notes (Signed)
Family at bedside. EDP has updated family on plan of care. Family updated by RN also and still at bedside currently

## 2019-01-07 NOTE — ED Notes (Signed)
Increased High Flow [place] to 35L per patient feeling increased WOB. Patient oxygen saturations are 100% on 60%. Will continue to monitor and make changes as needed.

## 2019-01-07 NOTE — Progress Notes (Signed)
ANTICOAGULATION CONSULT NOTE - Initial Consult  Pharmacy Consult for Heparin Indication: chest pain/ACS  No Known Allergies  Patient Measurements: Height: _0  (157.5 cm) Weight: 168 lb (76.2 kg) IBW/kg (Calculated) : 50.1 Heparin Dosing Weight: 66.7 kg  Vital Signs: Temp: Monica.4 F (36.9 C) (07/03 1739) Temp Source: Oral (07/03 1739) BP: 197/130 (07/03 2115) Pulse Rate: Monica (07/03 2122)  Labs: Recent Labs    01/07/19 1850  HGB 13.2  HCT 42.2  PLT 193  CREATININE 4.63*  TROPONINIHS 195*    Estimated Creatinine Clearance: 8.3 mL/min (A) (by C-G formula based on SCr of 4.63 mg/dL (H)).   Medical History: Past Medical History:  Diagnosis Date  . Asthma   . Bilateral pleural effusion    a. s/p CABG >> s/p bilat thoracentesis 11/2015  . Candida infection 10/2015   Bilateral breasts  . Chronic diastolic CHF    Echo 03/55 [facility]):  EF 55-65, normal wall motion, normal diastolic function  . Coronary artery disease    LHC 3/17 >> 3 v CAD >> CABG // MV 01/2018:  Abnormal, low risk stress nuclear study with significant breast attenuation; cannot R/O prior infarct; no ischemia; EF 56 with normal wall motion. // s/p NSTEMI 11/19 [facility]) >> LHC:  dLM 90, pLAD 100, pLCx 100, mRCA 100, L-LAD ok, S-Dx prox 70, S-OM dist 99 (ulc); S-PDA 100 >>PCI: 2.25 x 24 mm Synergy DES to S-OM //  EF normal by Echo 11/19  . ESRD (end stage renal disease)    Dialysis Tu, Th, Sa  . Essential hypertension   . Gastroesophageal reflux disease   . History of blood transfusion   . History of echocardiogram    a. Echo 3/17: Mild focal basal septal hypertrophy, vigorous LVF, EF 65-70%, normal wall motion, grade 1 diastolic dysfunction, MAC  . Hyperlipidemia   . Long Term Cardiac Monitor 3-14 day    LT Monitor 09/2018: SR, Sinus Tach, 6 beats NSVT, 38 runs of SVT (brief); no AFib.  . Mass   . Peptic ulcer   . Protein calorie malnutrition ([place])   . Type 2 diabetes mellitus [facility])      Assessment: 85 yo Monica [name] with recurrent chest pain for last 24 hours partially relieved by nitroglycerin.  Now with elevated troponin. Pharmacy consulted for heparin dosing.  Patient was not on any anticoagulant other than aspirin and Plavix PTA.  She does have ESRD.  Goal of Therapy:  Heparin level 0.3-0.7 units/ml Monitor platelets by anticoagulation protocol: Yes   Plan:  Give 4000 units bolus x 1 Start heparin infusion at 900 units/hr Check anti-Xa level in 8 hours and daily while on heparin Continue to monitor H&H and platelets   Alanda Slim, PharmD, [facility] Clinical Pharmacist Please see AMION for all Pharmacists' Contact Phone Numbers 01/07/2019, 9:30 PM

## 2019-01-07 NOTE — ED Notes (Signed)
Patient placed on [place] High Flow nasal cannula. Originally set at 10L @ 60% with oxygen saturations of 100%. Patient became SOB transferred to Rm 14 and High Flow increased to 30L @ 60% with oxygen saturations of 100%. Will continue to monitor and adjust as needed.

## 2019-01-07 NOTE — ED Notes (Signed)
EDP aware of inability to collect Trop due to patient condition. Will attempt at a later time

## 2019-01-07 NOTE — ED Notes (Signed)
[place]

## 2019-01-07 NOTE — ED Notes (Signed)
Critical lab received; troponin 222; Dr Reather Converse aware.

## 2019-01-08 ENCOUNTER — Inpatient Hospital Stay (HOSPITAL_COMMUNITY): Payer: Medicare HMO

## 2019-01-08 DIAGNOSIS — I351 Nonrheumatic aortic (valve) insufficiency: Secondary | ICD-10-CM

## 2019-01-08 DIAGNOSIS — I340 Nonrheumatic mitral (valve) insufficiency: Secondary | ICD-10-CM

## 2019-01-08 DIAGNOSIS — I214 Non-ST elevation (NSTEMI) myocardial infarction: Secondary | ICD-10-CM | POA: Diagnosis present

## 2019-01-08 DIAGNOSIS — I200 Unstable angina: Secondary | ICD-10-CM

## 2019-01-08 DIAGNOSIS — R079 Chest pain, unspecified: Secondary | ICD-10-CM

## 2019-01-08 LAB — MRSA PCR SCREENING: MRSA by PCR: NEGATIVE

## 2019-01-08 LAB — CBC
HCT: 41.5 % (ref 36.0–46.0)
HCT: 41.8 % (ref 36.0–46.0)
Hemoglobin: 12.9 g/dL (ref 12.0–15.0)
Hemoglobin: 13.5 g/dL (ref 12.0–15.0)
MCH: 28.9 pg (ref 26.0–34.0)
MCH: 29.2 pg (ref 26.0–34.0)
MCHC: 31.1 g/dL (ref 30.0–36.0)
MCHC: 32.3 g/dL (ref 30.0–36.0)
MCV: 92.8 fL (ref 80.0–100.0)
MCV: 90.3 fL (ref 80.0–100.0)
Platelets: 202 10*3/uL (ref 150–400)
Platelets: 213 10*3/uL (ref 150–400)
RBC: 4.47 MIL/uL (ref 3.87–5.11)
RBC: 4.63 MIL/uL (ref 3.87–5.11)
RDW: 19 % — ABNORMAL HIGH (ref 11.5–15.5)
RDW: 18.8 % — ABNORMAL HIGH (ref 11.5–15.5)
WBC: 7.6 10*3/uL (ref 4.0–10.5)
WBC: 9.3 10*3/uL (ref 4.0–10.5)
nRBC: 0 % (ref 0.0–0.2)
nRBC: 0 % (ref 0.0–0.2)

## 2019-01-08 LAB — ECHOCARDIOGRAM COMPLETE
Height: 62 in
Weight: 2603.2 oz

## 2019-01-08 LAB — TROPONIN I (HIGH SENSITIVITY)
Troponin I (High Sensitivity): 220 ng/L (ref <18)
Troponin I (High Sensitivity): 241 ng/L (ref <18)

## 2019-01-08 LAB — BASIC METABOLIC PANEL
Anion gap: 14 (ref 5–15)
BUN: 41 mg/dL — ABNORMAL HIGH (ref 8–23)
CO2: 22 mmol/L (ref 22–32)
Calcium: 8.7 mg/dL — ABNORMAL LOW (ref 8.9–10.3)
Chloride: 101 mmol/L (ref 98–111)
Creatinine, Ser: 4.74 mg/dL — ABNORMAL HIGH (ref 0.44–1.00)
GFR calc Af Amer: 9 mL/min — ABNORMAL LOW (ref >60)
GFR calc non Af Amer: 8 mL/min — ABNORMAL LOW (ref >60)
Glucose, Bld: 182 mg/dL — ABNORMAL HIGH (ref 70–99)
Potassium: 4.9 mmol/L (ref 3.5–5.1)
Sodium: 137 mmol/L (ref 135–145)

## 2019-01-08 LAB — HEPARIN LEVEL (UNFRACTIONATED)
Heparin Unfractionated: 0.85 IU/mL — ABNORMAL HIGH (ref 0.30–0.70)
Heparin Unfractionated: 0.47 IU/mL (ref 0.30–0.70)

## 2019-01-08 LAB — HEMOGLOBIN A1C
Hgb A1c MFr Bld: 7 % — ABNORMAL HIGH (ref 4.8–5.6)
Mean Plasma Glucose: 154.2 mg/dL

## 2019-01-08 LAB — GLUCOSE, CAPILLARY
Glucose-Capillary: 172 mg/dL — ABNORMAL HIGH (ref 70–99)
Glucose-Capillary: 199 mg/dL — ABNORMAL HIGH (ref 70–99)
Glucose-Capillary: 84 mg/dL (ref 70–99)
Glucose-Capillary: 177 mg/dL — ABNORMAL HIGH (ref 70–99)

## 2019-01-08 MED ORDER — SODIUM CHLORIDE 0.9 % IV SOLN
INTRAVENOUS | Status: DC
  Administered 2019-01-08: 04:00:00 via INTRAVENOUS

## 2019-01-08 MED ORDER — LIDOCAINE-PRILOCAINE 2.5-2.5 % EX CREA: 1.0000 "application " | TOPICAL_CREAM | CUTANEOUS | Status: DC | PRN

## 2019-01-08 MED ORDER — ALUM & MAG HYDROXIDE-SIMETH 200-200-20 MG/5ML PO SUSP
30.0000 mL | Freq: Four times a day (QID) | ORAL | Status: DC | PRN
  Administered 2019-01-08: 30 mL via ORAL
  Filled 2019-01-08: qty 30

## 2019-01-08 MED ORDER — SODIUM CHLORIDE 0.9 % IV SOLN: 100.0000 mL | INTRAVENOUS | Status: DC | PRN

## 2019-01-08 MED ORDER — CLOPIDOGREL BISULFATE 75 MG PO TABS
75.0000 mg | ORAL_TABLET | Freq: Every day | ORAL | Status: DC
  Administered 2019-01-08 – 2019-01-11 (×3): 75 mg via ORAL
  Filled 2019-01-08 (×4): qty 1

## 2019-01-08 MED ORDER — LATANOPROST 0.005 % OP SOLN
1.0000 [drp] | Freq: Every day | OPHTHALMIC | Status: DC
  Administered 2019-01-08 – 2019-01-09 (×2): 1 [drp] via OPHTHALMIC
  Filled 2019-01-08 (×3): qty 2.5

## 2019-01-08 MED ORDER — METOPROLOL TARTRATE 25 MG PO TABS
25.0000 mg | ORAL_TABLET | Freq: Two times a day (BID) | ORAL | Status: DC
  Administered 2019-01-08 (×2): 25 mg via ORAL
  Filled 2019-01-08 (×3): qty 1

## 2019-01-08 MED ORDER — FERRIC CITRATE 1 GM 210 MG(FE) PO TABS
420.0000 mg | ORAL_TABLET | Freq: Three times a day (TID) | ORAL | Status: DC
  Administered 2019-01-08 – 2019-01-11 (×5): 420 mg via ORAL
  Filled 2019-01-08 (×11): qty 2

## 2019-01-08 MED ORDER — ALTEPLASE 2 MG IJ SOLR: 2.0000 mg | Freq: Once | INTRAMUSCULAR | Status: DC | PRN

## 2019-01-08 MED ORDER — CHLORHEXIDINE GLUCONATE CLOTH 2 % EX PADS
6.0000 | MEDICATED_PAD | Freq: Every day | CUTANEOUS | Status: DC
  Administered 2019-01-10: 6 via TOPICAL

## 2019-01-08 MED ORDER — ASPIRIN EC 81 MG PO TBEC
81.0000 mg | DELAYED_RELEASE_TABLET | Freq: Every day | ORAL | Status: DC
  Administered 2019-01-08 – 2019-01-11 (×3): 81 mg via ORAL
  Filled 2019-01-08 (×4): qty 1

## 2019-01-08 MED ORDER — HEPARIN SODIUM (PORCINE) 1000 UNIT/ML IJ SOLN
4000.0000 [IU] | Freq: Once | INTRAMUSCULAR | Status: AC
  Administered 2019-01-08: 4000 [IU]
  Filled 2019-01-08: qty 4

## 2019-01-08 MED ORDER — ORAL CARE MOUTH RINSE
15.0000 mL | Freq: Two times a day (BID) | OROMUCOSAL | Status: DC
  Administered 2019-01-08 – 2019-01-10 (×5): 15 mL via OROMUCOSAL

## 2019-01-08 MED ORDER — ATORVASTATIN CALCIUM 80 MG PO TABS
80.0000 mg | ORAL_TABLET | Freq: Every day | ORAL | Status: DC
  Administered 2019-01-08 – 2019-01-11 (×3): 80 mg via ORAL
  Filled 2019-01-08 (×4): qty 1

## 2019-01-08 MED ORDER — LIDOCAINE HCL (PF) 1 % IJ SOLN: 5.0000 mL | INTRAMUSCULAR | Status: DC | PRN

## 2019-01-08 MED ORDER — HEPARIN SODIUM (PORCINE) 1000 UNIT/ML DIALYSIS: 1000.0000 [IU] | INTRAMUSCULAR | Status: DC | PRN

## 2019-01-08 MED ORDER — ALBUTEROL SULFATE (2.5 MG/3ML) 0.083% IN NEBU: 2.5000 mg | INHALATION_SOLUTION | Freq: Four times a day (QID) | RESPIRATORY_TRACT | Status: DC | PRN

## 2019-01-08 MED ORDER — INSULIN ASPART 100 UNIT/ML ~~LOC~~ SOLN
0.0000 [IU] | Freq: Three times a day (TID) | SUBCUTANEOUS | Status: DC
  Administered 2019-01-08 (×2): 2 [IU] via SUBCUTANEOUS
  Administered 2019-01-09 – 2019-01-10 (×3): 1 [IU] via SUBCUTANEOUS

## 2019-01-08 MED ORDER — SODIUM CHLORIDE 0.9% FLUSH
3.0000 mL | Freq: Two times a day (BID) | INTRAVENOUS | Status: DC
  Administered 2019-01-08 – 2019-01-10 (×3): 3 mL via INTRAVENOUS

## 2019-01-08 MED ORDER — PENTAFLUOROPROP-TETRAFLUOROETH EX AERO: 1.0000 "application " | INHALATION_SPRAY | CUTANEOUS | Status: DC | PRN

## 2019-01-08 NOTE — ED Notes (Signed)
Report given to [place], RN. Care link

## 2019-01-08 NOTE — Progress Notes (Signed)
[facility] for Heparin Indication: chest pain/ACS  No Known Allergies  Patient Measurements: Height: _0  (157.5 cm) Weight: 161 lb 13.1 oz (73.4 kg) IBW/kg (Calculated) : 50.1 Heparin Dosing Weight: 66.7 kg  Vital Signs: Temp: 97.7 F (36.5 C) (07/04 0400) Temp Source: Oral (07/04 0400) BP: 140/70 (07/04 0500) Pulse Rate: 85 (07/04 0500)  Labs: Recent Labs    01/07/19 1850 01/07/19 2230 01/08/19 0220 01/08/19 0534  HGB 13.2  --  12.9  --   HCT 42.2  --  41.5  --   PLT 193  --  202  --   HEPARINUNFRC  --   --   --  0.85*  CREATININE 4.63*  --  4.74*  --   TROPONINIHS 195* 222* 220*  --     Estimated Creatinine Clearance: 8 mL/min (A) (by C-G formula based on SCr of 4.74 mg/dL (H)).   Medical History: Past Medical History:  Diagnosis Date  . Asthma   . Bilateral pleural effusion    a. s/p CABG >> s/p bilat thoracentesis 11/2015  . Candida infection 10/2015   Bilateral breasts  . Chronic diastolic CHF    Echo 97/53 [facility]):  EF 55-65, normal wall motion, normal diastolic function  . Coronary artery disease    LHC 3/17 >> 3 v CAD >> CABG // MV 01/2018:  Abnormal, low risk stress nuclear study with significant breast attenuation; cannot R/O prior infarct; no ischemia; EF 56 with normal wall motion. // s/p NSTEMI 11/19 [facility]) >> LHC:  dLM 90, pLAD 100, pLCx 100, mRCA 100, L-LAD ok, S-Dx prox 70, S-OM dist 99 (ulc); S-PDA 100 >>PCI: 2.25 x 24 mm Synergy DES to S-OM //  EF normal by Echo 11/19  . ESRD (end stage renal disease)    Dialysis Tu, Th, Sa  . Essential hypertension   . Gastroesophageal reflux disease   . History of blood transfusion   . History of echocardiogram    a. Echo 3/17: Mild focal basal septal hypertrophy, vigorous LVF, EF 65-70%, normal wall motion, grade 1 diastolic dysfunction, MAC  . Hyperlipidemia   . Long Term Cardiac Monitor 3-14 day    LT Monitor 09/2018: SR, Sinus Tach, 6 beats NSVT,  38 runs of SVT (brief); no AFib.  . Mass   . Peptic ulcer   . Protein calorie malnutrition ([place])   . Type 2 diabetes mellitus [facility])     Assessment: 85 yo female with recurrent chest pain for last 24 hours partially relieved by nitroglycerin.  Now with elevated troponin. Pharmacy consulted for heparin dosing.  Patient was not on any anticoagulant other than aspirin and Plavix PTA.  She does have ESRD.  7/4 AM update: heparin level elevated, no issues per RN, CBC good  Goal of Therapy:  Heparin level 0.3-0.7 units/ml Monitor platelets by anticoagulation protocol: Yes   Plan:  Dec heparin to 700 units/hr Re-check heparin level in 8 hours  Narda Bonds, PharmD, [place] Pharmacist Phone: [phone]

## 2019-01-08 NOTE — Progress Notes (Signed)
PCCM:   Please see full note H&P by Dr. Mariane Masters.  I will contact cardiology this morning.  Nephrology as already been by and they plan to take her dialysis today.  CP resolved. Off nitroglycerin  Continue heparin ggt  Stable for PCU/Cardiac tele I will speak with the hospitalist service.   Garner Nash, DO [facility] 01/08/2019 7:47 AM

## 2019-01-08 NOTE — Consult Note (Addendum)
Cardiology Consultation:   Patient ID: Monica [name] MRN: [id number]; DOB: April 26, 1931  Admit date: 01/07/2019 Date of Consult: 01/08/2019  Primary Care Provider: Benito Mccreedy, MD Primary Cardiologist: Ena Dawley, MD  Primary Electrophysiologist:  None    Patient Profile:   Monica [name] is a 85 y.o. female with a hx of CAD with CABG 09/2015, HTN, DM, asthma, GERD, chronic diastolic HF, ESRD on HD,  And peptic ulcer disease who is being seen today for the evaluation of chest pain at the request of Dr. Mariane Masters.  History of Present Illness:   Ms. Klontz with above hx with CAD and CABG 2017 and last year NSTEMI cath 06/09/18 in [place] with native vessels occluded except on 90% Lt main, LIMA to LAD patent, VG to diag with 70% occlusion VG tyo OM 99% ulcerated lesion and OM deeds collaterals to dRCA. VG to PDA occluded.  PCI with stent to VG to OM successful.  Placed on Plavix not ASA due to GI bleed.     Now admitted 01/07/19 with chest pressure -tightness- like an elephant on chest, took 1 NTG with improvement but  pain continued, she was significantly SOB, some diaphoresis.   She went to [facility] and troponins  HS 195; 222; 230; 241.  Admitted with NSTEMI, and her BNP 3,534.    EKG:  The EKG was personally reviewed and demonstrates:  SR at 81 poor R wave progression, continued Q waves in V1-2 and mild ST depression in lat leads new from 08/11/18.   Telemetry:  Telemetry was personally reviewed and demonstrates:  SR  Na 137, K+ 4.9 BUN 41, Cr 4.74 Hgb 12.9, Hct 41.5  plts 202 COVID neg   2V CXR at [facility] was with small bil. Effusions  PCXR here NAD   Now on IV heparin and IV NTG.  BP initially elevated 201/98 now improved 136/66.  Echo pending.  Dialysis today.  Normally T, Th, Sat.  No pain or SOB now, CXR improved   Heart Pathway Score:     Past Medical History:  Diagnosis Date   Asthma    Bilateral pleural effusion    a. s/p CABG >> s/p bilat thoracentesis 11/2015   Candida  infection 10/2015   Bilateral breasts   Chronic diastolic CHF    Echo 40/34 [facility]):  EF 55-65, normal wall motion, normal diastolic function   Coronary artery disease    LHC 3/17 >> 3 v CAD >> CABG // MV 01/2018:  Abnormal, low risk stress nuclear study with significant breast attenuation; cannot R/O prior infarct; no ischemia; EF 56 with normal wall motion. // s/p NSTEMI 11/19 [facility]) >> LHC:  dLM 90, pLAD 100, pLCx 100, mRCA 100, L-LAD ok, S-Dx prox 70, S-OM dist 99 (ulc); S-PDA 100 >>PCI: 2.25 x 24 mm Synergy DES to S-OM //  EF normal by Echo 11/19   ESRD (end stage renal disease)    Dialysis Tu, Th, Sa   Essential hypertension    Gastroesophageal reflux disease    History of blood transfusion    History of echocardiogram    a. Echo 3/17: Mild focal basal septal hypertrophy, vigorous LVF, EF 65-70%, normal wall motion, grade 1 diastolic dysfunction, MAC   Hyperlipidemia    Long Term Cardiac Monitor 3-14 day    LT Monitor 09/2018: SR, Sinus Tach, 6 beats NSVT, 38 runs of SVT (brief); no AFib.   Mass    Peptic ulcer    Protein calorie malnutrition ([place])  Type 2 diabetes mellitus [facility])     Past Surgical History:  Procedure Laterality Date   ABDOMINAL HYSTERECTOMY     CARDIAC CATHETERIZATION N/A 09/17/2015   Procedure: Left Heart Cath and Coronary Angiography;  Surgeon: Jettie Booze, MD;  Location: [place] CV LAB;  Service: Cardiovascular;  Laterality: N/A;   COLON SURGERY     CORONARY ARTERY BYPASS GRAFT N/A 09/19/2015   Procedure: CORONARY ARTERY BYPASS GRAFTING (CABG) TIMES FOUR USING BILATARAL SAPHENOUS VEIN GRAFTS AND LEFT INTERNAL MAMMARY ARTERY;  Surgeon: [name], MD;  Location: [place];  Service: Open Heart Surgery;  Laterality: N/A;   TEE WITHOUT CARDIOVERSION N/A 09/19/2015   Procedure: TRANSESOPHAGEAL ECHOCARDIOGRAM (TEE);  Surgeon: [name], MD;  Location: [place];  Service: Open Heart Surgery;  Laterality: N/A;      Home Medications:  Prior to Admission medications   Medication Sig Start Date End Date Taking? Authorizing Provider  albuterol (PROVENTIL HFA;VENTOLIN HFA) 108 (90 Base) MCG/ACT inhaler Inhale 2 puffs into the lungs every 6 (six) hours as needed for wheezing or shortness of breath.    [provider]  allopurinol (ZYLOPRIM) 100 MG tablet Take 1 tablet by mouth 2 (two) times daily.    [provider]  aspirin EC 81 MG EC tablet Take 1 tablet (81 mg total) by mouth daily. 10/03/15   Barrett, Erin R, PA-C  atorvastatin (LIPITOR) 80 MG tablet Take 1 tablet by mouth daily. 07/12/18   [provider]  Cholecalciferol (VITAMIN D-3) 1000 units CAPS Take 1 capsule by mouth daily.    [provider]  clopidogrel (PLAVIX) 75 MG tablet Take 1 tablet by mouth daily.    [provider]  diphenhydrAMINE (BENADRYL) 25 MG tablet Take 25 mg by mouth as needed for itching or allergies.     [provider]  ferric citrate (AURYXIA) 1 GM 210 MG(Fe) tablet Take 420 mg by mouth 3 (three) times daily with meals.    [provider]  insulin glargine (LANTUS) 100 UNIT/ML injection 10 UNITS INTO THE SKIN AT NIGHT BEFORE BEDTIME    [provider]  isosorbide mononitrate (IMDUR) 30 MG 24 hr tablet Take 30 mg by mouth daily.    [provider]  latanoprost (XALATAN) 0.005 % ophthalmic solution Place 1 drop into both eyes at bedtime. 07/05/18   [provider]  loratadine (CLARITIN) 10 MG tablet Take 1 tablet by mouth as needed.    [provider]  Melatonin 5 MG CAPS Take 1 tablet by mouth at bedtime.    [provider]  meloxicam (MOBIC) 7.5 MG tablet Take 1 tablet by mouth as needed. 08/02/18   [provider]  metoprolol tartrate (LOPRESSOR) 25 MG tablet Take 25 mg by mouth 2 (two) times daily.    [provider]  nitroGLYCERIN (NITROSTAT) 0.4 MG SL tablet PLACE 1 TABLET BY MOUTH UNDER THE TONGUE  EVERY 5 MINUTES AS NEEDED FOR CHEST PAIN. FOR UP TO 3 DOSES. IF NO RELIEF CALL 911 07/21/18   Dorothy Spark, MD  Propylene Glycol (SYSTANE COMPLETE) 0.6 % SOLN Place 1 drop into both eyes 3 (three) times daily.    [provider]  vitamin B-12 (CYANOCOBALAMIN) 1000 MCG tablet Take 1,000 mcg by mouth daily.    [provider]    Inpatient Medications: Scheduled Meds:  aspirin  324 mg Oral Once   aspirin EC  81 mg Oral Daily   atorvastatin  80 mg Oral  Daily   Chlorhexidine Gluconate Cloth  6 each Topical Q0600   clopidogrel  75 mg Oral Daily   insulin aspart  0-9 Units Subcutaneous TID WC   latanoprost  1 drop Both Eyes QHS   mouth rinse  15 mL Mouth Rinse BID   metoprolol tartrate  25 mg Oral BID   sodium chloride flush  3 mL Intravenous Once   Continuous Infusions:  sodium chloride 10 mL/hr at 01/08/19 0800   heparin 700 Units/hr (01/08/19 0800)   nitroGLYCERIN Stopped (01/08/19 0742)   PRN Meds: albuterol  Allergies:   No Known Allergies  Social History:   Social History   Socioeconomic History   Marital status: Widowed    Spouse name: Not on file   Number of children: Not on file   Years of education: Not on file   Highest education level: Not on file  Occupational History   Occupation: Retired  Scientist, product/process development strain: Not on file   Food insecurity    Worry: Not on file    Inability: Not on Lexicographer needs    Medical: Not on file    Non-medical: Not on file  Tobacco Use   Smoking status: Never Smoker   Smokeless tobacco: Former Systems developer    Types: Snuff  Substance and Sexual Activity   Alcohol use: No    Alcohol/week: 0.0 standard drinks   Drug use: No   Sexual activity: Not on file  Lifestyle   Physical activity    Days per week: Not on file    Minutes per session: Not on file   Stress: Not on file  Relationships   Social connections    Talks on phone: Not on file    Gets  together: Not on file    Attends religious service: Not on file    Active member of club or organization: Not on file    Attends meetings of clubs or organizations: Not on file    Relationship status: Not on file   Intimate partner violence    Fear of current or ex partner: Not on file    Emotionally abused: Not on file    Physically abused: Not on file    Forced sexual activity: Not on file  Other Topics Concern   Not on file  Social History Narrative   Not on file    Family History:    Family History  Problem Relation Age of Onset   Heart attack Father      ROS:  Please see the history of present illness.  General:no colds or fevers, no weight changes Skin:no rashes or ulcers HEENT:no blurred vision, no congestion CV:see HPI PUL:see HPI GI:no diarrhea constipation or melena, no indigestion GU:no hematuria, no dysuria MS:no joint pain, no claudication Neuro:no syncope, no lightheadedness Endo:no diabetes, no thyroid disease  All other ROS reviewed and negative.     Physical Exam/Data:   Vitals:   01/08/19 0600 01/08/19 0700 01/08/19 0722 01/08/19 0800  BP: (!) 152/73 136/81  (!) 125/57  Pulse: 86 87  86  Resp: 13 17  (!) 0  Temp:   97.7 F (36.5 C)   TempSrc:   Oral   SpO2: 100% 100%  100%  Weight:      Height:        Intake/Output Summary (Last 24 hours) at 01/08/2019 0846 Last data filed at 01/08/2019 0800 Gross per 24 hour  Intake 530.51 ml  Output --  Net 530.51 ml   Last 3 Weights 01/08/2019 01/07/2019 11/17/2018  Weight (lbs) 161 lb 13.1 oz 168 lb 162 lb  Weight (kg) 73.4 kg 76.204 kg 73.483 kg     Body mass index is 29.6 kg/m.  General:  Well nourished, well developed, in no acute distress HEENT: normal Lymph: no adenopathy Neck: no JVD Endocrine:  No thryomegaly Vascular: No carotid bruits; pedal pulses 2+ bilaterally  Cardiac:  normal S1, S2; RRR; no murmur gallup rub or click Lungs:  clear to auscultation bilaterally, no wheezing, rhonchi  or rales  Abd: soft, nontender, no hepatomegaly  Ext: no edema Musculoskeletal:  No deformities, BUE and BLE strength normal and equal Skin: warm and dry  Neuro:  Alert and orieneted X 3 , MAE follows commands, no focal abnormalities noted Psych:  Normal affect     Relevant CV Studies: See cath report 12/19 above   Echo 2019 [facility] with normal EF and valves.  No RWMA  Laboratory Data:  High Sensitivity Troponin:   Recent Labs  Lab 01/07/19 1850 01/07/19 2230 01/08/19 0220 01/08/19 0534  TROPONINIHS 195* 222* 220* 241*     Cardiac EnzymesNo results for input(s): TROPONINI in the last 168 hours. No results for input(s): TROPIPOC in the last 168 hours.  Chemistry Recent Labs  Lab 01/07/19 1850 01/08/19 0220  NA 139 137  K 4.4 4.9  CL 102 101  CO2 24 22  GLUCOSE 133* 182*  BUN 40* 41*  CREATININE 4.63* 4.74*  CALCIUM 9.0 8.7*  GFRNONAA 8* 8*  GFRAA 9* 9*  ANIONGAP 13 14    No results for input(s): PROT, ALBUMIN, AST, ALT, ALKPHOS, BILITOT in the last 168 hours. Hematology Recent Labs  Lab 01/07/19 1850 01/08/19 0220  WBC 6.9 7.6  RBC 4.53 4.47  HGB 13.2 12.9  HCT 42.2 41.5  MCV 93.2 92.8  MCH 29.1 28.9  MCHC 31.3 31.1  RDW 19.5* 19.0*  PLT 193 202   BNP Recent Labs  Lab 01/07/19 1850  BNP 3,534.3*    DDimer No results for input(s): DDIMER in the last 168 hours.   Radiology/Studies:  Dg Chest 2 View  Result Date: 01/07/2019 CLINICAL DATA:  Patient with generalized chest pain. EXAM: CHEST - 2 VIEW COMPARISON:  Chest radiograph 06/13/2018 FINDINGS: Central venous catheter tip projects over the right atrium. Stable cardiomegaly. Small bilateral pleural effusions. Similar-appearing bibasilar atelectasis. Similar-appearing interstitial opacities bilaterally. Thoracic spine degenerative changes. IMPRESSION: Cardiomegaly. Small bilateral pleural effusions and underlying atelectasis. Mild interstitial edema. Electronically Signed   By: Lovey Newcomer  M.D.   On: 01/07/2019 18:24   Dg Chest Port 1v Same Day  Result Date: 01/07/2019 CLINICAL DATA:  Shortness of breath. EXAM: PORTABLE CHEST 1 VIEW COMPARISON:  Radiographs of January 07, 2019. FINDINGS: Stable cardiomegaly. Status post coronary artery bypass graft. Right internal jugular dialysis catheter is unchanged in position. No pneumothorax or pleural effusion is noted. No acute pulmonary disease is noted. Bony thorax is unremarkable. IMPRESSION: No active disease. Electronically Signed   By: Marijo Conception M.D.   On: 01/07/2019 21:25    Assessment and Plan:   1. NSTEMI with pk HS troponin 241 - + CAD with CABG 2017 and last cath with NSTEMI 06/2018 and stent to VG to OM.  On IV heparin, NTG, statin ASA and plavix,  She is active, lives alone and does most housekeeping.  Possible cardiac cath on Monday if remains stable Dr. Stanford Breed to see.  2. CAD  with CABG, and on last cath severe native CAD all occluded except only 90% LM and VG disease.  LIMA to LAD patent, VG to diag with 70% stenosis, VG to OM patent after stent and VF to PDA occluded.  She is on ASA and plavix.  BB, statin at high dose  3.  chronic diastolic HF - CXR appears improved from original.  On the IV ntg.  BNP elevated - needs dialysis today 4. ESRD on HD -  5. DM insulin dependent per IM    6. HLD on lipitor 80 continue 7. HTN - elevated on admit now controlled on IV NTG  Also on lopressor 25 BID  8. Hx of peptic ulcer and GI bleed with stable Hgb.       For questions or updates, please contact [place] Please consult [email or web address] for contact info under     Signed, Cecilie Kicks, NP  01/08/2019 8:46 AM   As above, patient seen and examined.  Briefly she is an 85 year old female with past medical history of coronary artery disease status post coronary artery bypass and graft, end-stage renal disease dialysis dependent, hypertension, diabetes mellitus, chronic diastolic heart failure for evaluation of chest pain.   Patient is status post coronary artery bypass and graft in 2017.  She had PCI of the saphenous vein graft to her obtuse marginal in December 2019 in [place].  She has done well but yesterday morning she awoke with sudden onset of dyspnea and chest pressure described as an elephant sitting on her chest.  No radiation or associated symptoms.  She thought it may be asthma but did not improve with inhalers.  She also tried nitroglycerin with some improvement but not complete resolution.  She eventually presented to the emergency room and was admitted.  Presently pain-free.  Cardiology asked to evaluate.  Troponin is 195, 222, 220 and 241.  Electrocardiogram shows sinus rhythm with inferolateral ST depression/T wave inversion worse compared to previous; cannot rule out prior septal infarct.  1 unstable angina-symptoms are concerning.  Troponins are not consistent with acute coronary syndrome and may be mildly elevated because of renal insufficiency.  Electrocardiogram shows worse inferolateral ST depression.  Continue aspirin, Plavix, heparin, statin and metoprolol.  Continue IV nitroglycerin.  Plan cardiac catheterization on Monday.  The risks and benefits including myocardial infarction, CVA and death discussed and she agrees to proceed.  2 hyperlipidemia-continue statin.  3 hypertension-continue present blood pressure medications and follow.  4 end-stage renal disease-dialysis per nephrology.  Kirk Ruths

## 2019-01-08 NOTE — Progress Notes (Signed)
ANTICOAGULATION CONSULT NOTE - Follow Up Consult  Pharmacy Consult for Heparin Indication: chest pain/ACS  No Known Allergies  Patient Measurements: Height: 5\' 2"  (157.5 cm) Weight: 162 lb 11.2 oz (73.8 kg) IBW/kg (Calculated) : 50.1 Heparin Dosing Weight: 66 kg  Vital Signs: Temp: 97.9 F (36.6 C) (07/04 1118) Temp Source: Oral (07/04 1118) BP: 143/69 (07/04 1118) Pulse Rate: 83 (07/04 1118)  Labs: Recent Labs    01/07/19 1850 01/07/19 2230 01/08/19 0220 01/08/19 0534 01/08/19 1526  HGB 13.2  --  12.9  --   --   HCT 42.2  --  41.5  --   --   PLT 193  --  202  --   --   HEPARINUNFRC  --   --   --  0.85* 0.47  CREATININE 4.63*  --  4.74*  --   --   TROPONINIHS 195* 222* 220* 241*  --    Assessment:  85 yo female admitted with recurrent chest pain for last 24 hours partially relieved by nitroglycerin. Troponins elevated/ NSTEMI.  Pharmacy consulted for heparin dosing.  Patient was not on any anticoagulant other than aspirin and Plavix PTA.  ESRD.    Heparin level is therapeutic (0.47) this afternoon on 700 units/hr.  Rate was reduced this am after initial heparin level was supratherapeutic.   Goal of Therapy:  Heparin level 0.3-0.7 units/ml Monitor platelets by anticoagulation protocol: Yes   Plan:   Continue heparin drip at 700 units/hr.  Daily heparin level and CBC while on heparin.    Arty Baumgartner, [place] Pager: [phone] or phone: [phone] 01/08/2019,3:58 PM

## 2019-01-08 NOTE — Progress Notes (Signed)
  Echocardiogram 2D Echocardiogram has been performed.  Merrie Roof F 01/08/2019, 12:21 PM

## 2019-01-08 NOTE — H&P (Signed)
NAMELadeja [name], MRN:  [id number], DOB:  09-17-1930, LOS: 1 ADMISSION DATE:  01/07/2019, CONSULTATION DATE:  10/09/2018 REFERRING MD:  Addison Naegeli, CHIEF COMPLAINT:  Chest pressure, SOB  Brief History   Patient with known CAD, ESRD, SP CABG with chest pressure not completely relieved by NTG  History of present illness   Patient is an 85 year old female with a history of previous MI, coronary artery bypass grafting 2 years ago, end-stage renal disease on dialysis awoke this morning with chest pressure that was not typical for her normal chest pain..  She took a nitroglycerin continued to have chest pressure through the morning.  She did have some diaphoresis.  She presented to the emergency room at [facility] with elevated troponins and clinical course consistent with non-STEMI.  She was started on heparin and nitroglycerin drip with relief of her chest pain improvement in her dyspnea and transferred to [facility] for further evaluation.  On evaluation here she says her chest pain is been relieved.  At [facility] she was on 15 L nasal cannula but was easily titrated to 6 L nasal cannula here.  On my evaluation she is awake alert able to converse in full sentences.  He does not appear dyspneic currently.  She tells me she does take a nitroglycerin about once every 3 to 4 months with complete pain relief.  Troponins were elevated at [facility] as high as 222.  BMP was remarkably normal save anion gap of 13 creatinine of 4.63 and BUN of 40.  BNP was 3534.  X-ray report from [facility] shows small bilateral effusions  Past Medical History  1.  Asthma 2.  History of coronary artery bypass grafting 3.  History of diastolic dysfunction. 4.  End-stage renal disease on dialysis: She has a right subclavian dialysis catheter placed in November with a left arm graft that she says will be mature next week 5.  History of hypertension 4.  History of type 2 diabetes  mellitus  Significant Hospital Events   Admission to the ICU 01/08/2019  Consults:  Cardiology Nephrology  Procedures:  NA  Significant Diagnostic Tests:  Elevated troponin and BNP  Micro Data:  NA  Antimicrobials:  NA  Interim history/subjective:  NA  Objective   Blood pressure (!) 161/98, pulse 86, temperature 98.5 F (36.9 C), temperature source Oral, resp. rate (!) 22, height 5\' 2"  (1.575 m), weight 76.2 kg, SpO2 100 %.    FiO2 (%):  [10 %] 10 %  No intake or output data in the 24 hours ending 01/08/19 0120 Filed Weights   01/07/19 1738  Weight: 76.2 kg    Examination: General: Pleasant 85 year old female appears younger than her stated age HENT: Within normal limits Lungs: Clear bilaterally Cardiovascular: Regular rate and rhythm Abdomen: Benign bowel sounds positive Extremities: Within normal limit Neuro: Nonfocal GU: Not performed  Resolved Hospital Problem list   NA  Assessment & Plan:  1.  Non-STEMI: Chest pain currently resolved.  We will continue heparin drip and nitroglycerin drip this evening and asked cardiology to evaluate in the morning for further therapy.  Obtain echocardiogram with elevated BNP. 2.  End-stage renal disease on dialysis.  Ask nephrology to evaluate and manage her dialysis while in-house. 3.  Hypertension: We will continue nitroglycerin drip continue metoprolol 4.  Hypoxemia will wean oxygen as tolerated 4.  Bilateral effusions will repeat chest x-ray in the morning along with echocardiogram  Best practice:  Diet: Low-sodium diet Pain/Anxiety/Delirium protocol (if indicated): N/A VAP protocol (if indicated): N/A DVT prophylaxis: SCD GI prophylaxis: N/A Glucose control: Sliding scale Mobility: Bedrest Code Status: Full Family Communication: Discussed with patient Disposition:   Labs   CBC: Recent Labs  Lab 01/07/19 1850  WBC 6.9  HGB 13.2  HCT 42.2  MCV 93.2  PLT 992    Basic Metabolic Panel: Recent Labs    Lab 01/07/19 1850  NA 139  K 4.4  CL 102  CO2 24  GLUCOSE 133*  BUN 40*  CREATININE 4.63*  CALCIUM 9.0   GFR: Estimated Creatinine Clearance: 8.3 mL/min (A) (by C-G formula based on SCr of 4.63 mg/dL (H)). Recent Labs  Lab 01/07/19 1850  WBC 6.9    Liver Function Tests: No results for input(s): AST, ALT, ALKPHOS, BILITOT, PROT, ALBUMIN in the last 168 hours. No results for input(s): LIPASE, AMYLASE in the last 168 hours. No results for input(s): AMMONIA in the last 168 hours.  ABG    Component Value Date/Time   PHART 7.273 (L) 09/21/2015 1628   PCO2ART 44.1 09/21/2015 1628   PO2ART 55.0 (L) 09/21/2015 1628   HCO3 20.4 09/21/2015 1628   TCO2 22 09/21/2015 1628   ACIDBASEDEF 6.0 (H) 09/21/2015 1628   O2SAT 84.0 09/21/2015 1628     Coagulation Profile: No results for input(s): INR, PROTIME in the last 168 hours.  Cardiac Enzymes: No results for input(s): CKTOTAL, CKMB, CKMBINDEX, TROPONINI in the last 168 hours.  HbA1C: Hemoglobin A1C  Date/Time Value Ref Range Status  10/09/2015 7.6  Final   Hgb A1c MFr Bld  Date/Time Value Ref Range Status  09/15/2015 03:53 AM 8.2 (H) 4.8 - 5.6 % Final    Comment:    (NOTE)         Pre-diabetes: 5.7 - 6.4         Diabetes: >6.4         Glycemic control for adults with diabetes: <7.0     CBG: No results for input(s): GLUCAP in the last 168 hours.  Review of Systems:   Has had no cough sputum production fevers chills nausea vomiting alteration in bowel or bladder habits.  Past Medical History  She,  has a past medical history of Asthma, Bilateral pleural effusion, Candida infection (10/2015), Chronic diastolic CHF, Coronary artery disease, ESRD (end stage renal disease), Essential hypertension, Gastroesophageal reflux disease, History of blood transfusion, History of echocardiogram, Hyperlipidemia, Long Term Cardiac Monitor 3-14 day, Mass, Peptic ulcer, Protein calorie malnutrition ([place]), and Type 2 diabetes mellitus  ([place]).   Surgical History    Past Surgical History:  Procedure Laterality Date   ABDOMINAL HYSTERECTOMY     CARDIAC CATHETERIZATION N/A 09/17/2015   Procedure: Left Heart Cath and Coronary Angiography;  Surgeon: Jettie Booze, MD;  Location: [place] CV LAB;  Service: Cardiovascular;  Laterality: N/A;   COLON SURGERY     CORONARY ARTERY BYPASS GRAFT N/A 09/19/2015   Procedure: CORONARY ARTERY BYPASS GRAFTING (CABG) TIMES FOUR USING BILATARAL SAPHENOUS VEIN GRAFTS AND LEFT INTERNAL MAMMARY ARTERY;  Surgeon: [name], MD;  Location: [place];  Service: Open Heart Surgery;  Laterality: N/A;   TEE WITHOUT CARDIOVERSION N/A 09/19/2015   Procedure: TRANSESOPHAGEAL ECHOCARDIOGRAM (TEE);  Surgeon: [name], MD;  Location: [place];  Service: Open Heart Surgery;  Laterality: N/A;     Social History   reports that she has never smoked. She has quit using smokeless tobacco.  Her smokeless tobacco use  included snuff. She reports that she does not drink alcohol or use drugs.   Family History   Her family history includes Heart attack in her father.   Allergies No Known Allergies   Home Medications  Prior to Admission medications   Medication Sig Start Date End Date Taking? Authorizing Provider  albuterol (PROVENTIL HFA;VENTOLIN HFA) 108 (90 Base) MCG/ACT inhaler Inhale 2 puffs into the lungs every 6 (six) hours as needed for wheezing or shortness of breath.    [provider]  allopurinol (ZYLOPRIM) 100 MG tablet Take 1 tablet by mouth 2 (two) times daily.    [provider]  aspirin EC 81 MG EC tablet Take 1 tablet (81 mg total) by mouth daily. 10/03/15   Barrett, Erin R, PA-C  atorvastatin (LIPITOR) 80 MG tablet Take 1 tablet by mouth daily. 07/12/18   [provider]  Cholecalciferol (VITAMIN D-3) 1000 units CAPS Take 1 capsule by mouth daily.    [provider]  clopidogrel (PLAVIX) 75 MG tablet Take 1 tablet by mouth daily.    [provider]  diphenhydrAMINE (BENADRYL) 25 MG tablet Take 25 mg by mouth as needed for itching or allergies.     [provider]  ferric citrate (AURYXIA) 1 GM 210 MG(Fe) tablet Take 420 mg by mouth 3 (three) times daily with meals.    [provider]  insulin glargine (LANTUS) 100 UNIT/ML injection 10 UNITS INTO THE SKIN AT NIGHT BEFORE BEDTIME    [provider]  isosorbide mononitrate (IMDUR) 30 MG 24 hr tablet Take 30 mg by mouth daily.    [provider]  latanoprost (XALATAN) 0.005 % ophthalmic solution Place 1 drop into both eyes at bedtime. 07/05/18   [provider]  loratadine (CLARITIN) 10 MG tablet Take 1 tablet by mouth as needed.    [provider]  Melatonin 5 MG CAPS Take 1 tablet by mouth at bedtime.    [provider]  meloxicam (MOBIC) 7.5 MG tablet Take 1 tablet by mouth as needed. 08/02/18   [provider]  metoprolol tartrate (LOPRESSOR) 25 MG tablet Take 25 mg by mouth 2 (two) times daily.    [provider]  nitroGLYCERIN (NITROSTAT) 0.4 MG SL tablet PLACE 1 TABLET BY MOUTH UNDER THE TONGUE EVERY 5 MINUTES AS NEEDED FOR CHEST PAIN. FOR UP TO 3 DOSES. IF NO RELIEF CALL 911 07/21/18   Dorothy Spark, MD  Propylene Glycol (SYSTANE COMPLETE) 0.6 % SOLN Place 1 drop into both eyes 3 (three) times daily.    [provider]  vitamin B-12 (CYANOCOBALAMIN) 1000 MCG tablet Take 1,000 mcg by mouth daily.    [provider]     Critical care time: #% minutes spent in evaluation and developing treatment plan

## 2019-01-08 NOTE — Consult Note (Signed)
Referring Provider: No ref. provider found Primary Care Physician:  Benito Mccreedy, MD Primary Nephrologist: [facility] nephrology  Reason for Consultation: Medical management end-stage renal disease, maintenance of euvolemia, evaluation and treatment secondary hyperparathyroidism, evaluation and treatment of anemia.  HPI: This is an 85 year old lady with history of myocardial infarction and coronary artery bypass grafting in 2017.  She is end-stage renal disease and continues on dialysis Tuesday Thursday Saturday at [facility].  She presents to the emergency room 01/07/2019 with a non-STEMI.  She was transferred to [facility] for further management.  Her 2D echo shows chronic diastolic dysfunction.  She had last 2D echo performed in 3/17 with an ejection fraction of 65 to 70%.  She has a history of diabetes and hypertension.  Home medications include albuterol inhaler, allopurinol 100 mg twice daily, aspirin 81 mg daily, Lipitor 80 mg daily, vitamin D 1000 units daily, Plavix 75 mg daily, Auryxia 2 tablets with meals, insulin glargine 10 units nightly, isosorbide 30 mg daily, metoprolol 25 mg twice daily  Medications in hospital Lipitor 80 mg daily, Lopressor 25 mg twice daily, insulin sliding scale, methylprednisolone 125 mg x 1 7 09/06/2023, Plavix 75 mg daily,   IV heparin IV nitroglycerin  Blood pressure 130/66 pulse 85 temperature 97.7 O2 sats 100% 4 L nasal cannula  Sodium 137 potassium 4.9 chloride 101 CO2 22 BUN 41 creatinine 4.74 glucose 8182 calcium 8.7 WBC 7.6 hemoglobin 12.9 platelets 202  Chest x-ray stable cardiomegaly no active disease    Past Medical History:  Diagnosis Date  . Asthma   . Bilateral pleural effusion    a. s/p CABG >> s/p bilat thoracentesis 11/2015  . Candida infection 10/2015   Bilateral breasts  . Chronic diastolic CHF    Echo 28/31 [facility]):  EF 55-65, normal wall motion, normal diastolic function  . Coronary artery disease    LHC  3/17 >> 3 v CAD >> CABG // MV 01/2018:  Abnormal, low risk stress nuclear study with significant breast attenuation; cannot R/O prior infarct; no ischemia; EF 56 with normal wall motion. // s/p NSTEMI 11/19 [facility]) >> LHC:  dLM 90, pLAD 100, pLCx 100, mRCA 100, L-LAD ok, S-Dx prox 70, S-OM dist 99 (ulc); S-PDA 100 >>PCI: 2.25 x 24 mm Synergy DES to S-OM //  EF normal by Echo 11/19  . ESRD (end stage renal disease)    Dialysis Tu, Th, Sa  . Essential hypertension   . Gastroesophageal reflux disease   . History of blood transfusion   . History of echocardiogram    a. Echo 3/17: Mild focal basal septal hypertrophy, vigorous LVF, EF 65-70%, normal wall motion, grade 1 diastolic dysfunction, MAC  . Hyperlipidemia   . Long Term Cardiac Monitor 3-14 day    LT Monitor 09/2018: SR, Sinus Tach, 6 beats NSVT, 38 runs of SVT (brief); no AFib.  . Mass   . Peptic ulcer   . Protein calorie malnutrition ([place])   . Type 2 diabetes mellitus ([place])     Past Surgical History:  Procedure Laterality Date  . ABDOMINAL HYSTERECTOMY    . CARDIAC CATHETERIZATION N/A 09/17/2015   Procedure: Left Heart Cath and Coronary Angiography;  Surgeon: Jettie Booze, MD;  Location: [place] CV LAB;  Service: Cardiovascular;  Laterality: N/A;  . COLON SURGERY    . CORONARY ARTERY BYPASS GRAFT N/A 09/19/2015   Procedure: CORONARY ARTERY BYPASS GRAFTING (CABG) TIMES FOUR USING BILATARAL SAPHENOUS VEIN GRAFTS AND LEFT  INTERNAL MAMMARY ARTERY;  Surgeon: [name], MD;  Location: [place];  Service: Open Heart Surgery;  Laterality: N/A;  . TEE WITHOUT CARDIOVERSION N/A 09/19/2015   Procedure: TRANSESOPHAGEAL ECHOCARDIOGRAM (TEE);  Surgeon: [name], MD;  Location: [place];  Service: Open Heart Surgery;  Laterality: N/A;    Prior to Admission medications   Medication Sig Start Date End Date Taking? Authorizing Provider  albuterol (PROVENTIL HFA;VENTOLIN HFA) 108 (90 Base) MCG/ACT inhaler Inhale 2 puffs  into the lungs every 6 (six) hours as needed for wheezing or shortness of breath.    [provider]  allopurinol (ZYLOPRIM) 100 MG tablet Take 1 tablet by mouth 2 (two) times daily.    [provider]  aspirin EC 81 MG EC tablet Take 1 tablet (81 mg total) by mouth daily. 10/03/15   Barrett, Erin R, PA-C  atorvastatin (LIPITOR) 80 MG tablet Take 1 tablet by mouth daily. 07/12/18   [provider]  Cholecalciferol (VITAMIN D-3) 1000 units CAPS Take 1 capsule by mouth daily.    [provider]  clopidogrel (PLAVIX) 75 MG tablet Take 1 tablet by mouth daily.    [provider]  diphenhydrAMINE (BENADRYL) 25 MG tablet Take 25 mg by mouth as needed for itching or allergies.     [provider]  ferric citrate (AURYXIA) 1 GM 210 MG(Fe) tablet Take 420 mg by mouth 3 (three) times daily with meals.    [provider]  insulin glargine (LANTUS) 100 UNIT/ML injection 10 UNITS INTO THE SKIN AT NIGHT BEFORE BEDTIME    [provider]  isosorbide mononitrate (IMDUR) 30 MG 24 hr tablet Take 30 mg by mouth daily.    [provider]  latanoprost (XALATAN) 0.005 % ophthalmic solution Place 1 drop into both eyes at bedtime. 07/05/18   [provider]  loratadine (CLARITIN) 10 MG tablet Take 1 tablet by mouth as needed.    [provider]  Melatonin 5 MG CAPS Take 1 tablet by mouth at bedtime.    [provider]  meloxicam (MOBIC) 7.5 MG tablet Take 1 tablet by mouth as needed. 08/02/18   [provider]  metoprolol tartrate (LOPRESSOR) 25 MG tablet Take 25 mg by mouth 2 (two) times daily.    [provider]  nitroGLYCERIN (NITROSTAT) 0.4 MG SL tablet PLACE 1 TABLET BY MOUTH UNDER THE TONGUE EVERY 5 MINUTES AS NEEDED FOR CHEST PAIN. FOR UP TO 3 DOSES. IF NO RELIEF CALL 911 07/21/18   Dorothy Spark, MD  Propylene Glycol (SYSTANE COMPLETE) 0.6 % SOLN Place 1 drop into both eyes 3 (three) times  daily.    [provider]  vitamin B-12 (CYANOCOBALAMIN) 1000 MCG tablet Take 1,000 mcg by mouth daily.    [provider]    Current Facility-Administered Medications  Medication Dose Route Frequency Provider Last Rate Last Dose  . 0.9 %  sodium chloride infusion   Intravenous Continuous Darlina Sicilian A, NP 10 mL/hr at 01/08/19 0900    . albuterol (PROVENTIL) (2.5 MG/3ML) 0.083% nebulizer solution 2.5 mg  2.5 mg Inhalation Q6H PRN Whiteheart, Kathryn A, NP      . aspirin chewable tablet 324 mg  324 mg Oral Once Benay Pike, MD      . aspirin EC tablet 81 mg  81 mg Oral Daily Whiteheart, Kathryn A, NP      . atorvastatin (LIPITOR) tablet 80 mg  80 mg Oral Daily Whiteheart, Cristal Ford, NP      .  Chlorhexidine Gluconate Cloth 2 % PADS 6 each  6 each Topical Q0600 Edrick Oh, MD      . clopidogrel (PLAVIX) tablet 75 mg  75 mg Oral Daily Whiteheart, Kathryn A, NP      . heparin ADULT infusion 100 units/mL (25000 units/281m sodium chloride 0.45%)  700 Units/hr Intravenous Continuous LErenest Blank RPH 7 mL/hr at 01/08/19 0900 700 Units/hr at 01/08/19 0900  . insulin aspart (novoLOG) injection 0-9 Units  0-9 Units Subcutaneous TID WC WMarijean Heath NP   2 Units at 01/08/19 0739  . latanoprost (XALATAN) 0.005 % ophthalmic solution 1 drop  1 drop Both Eyes QHS Whiteheart, Kathryn A, NP      . MEDLINE mouth rinse  15 mL Mouth Rinse BID McDiarmid, TBlane Ohara MD   15 mL at 01/08/19 0338  . metoprolol tartrate (LOPRESSOR) tablet 25 mg  25 mg Oral BID Whiteheart, Kathryn A, NP      . nitroGLYCERIN 50 mg in dextrose 5 % 250 mL (0.2 mg/mL) infusion  0-200 mcg/min Intravenous Continuous ZElnora Morrison MD   Stopped at 01/08/19 [phone] . sodium chloride flush (NS) 0.9 % injection 3 mL  3 mL Intravenous Once ZElnora Morrison MD        Allergies as of 01/07/2019  . (No Known Allergies)    Family History  Problem Relation Age of Onset  . Heart attack Father     Social  History   Socioeconomic History  . Marital status: Widowed    Spouse name: Not on file  . Number of children: Not on file  . Years of education: Not on file  . Highest education level: Not on file  Occupational History  . Occupation: Retired  SScientific laboratory technician . Financial resource strain: Not on file  . Food insecurity    Worry: Not on file    Inability: Not on file  . Transportation needs    Medical: Not on file    Non-medical: Not on file  Tobacco Use  . Smoking status: Never Smoker  . Smokeless tobacco: Former [employer] developer   Types: Snuff  Substance and Sexual Activity  . Alcohol use: No    Alcohol/week: 0.0 standard drinks  . Drug use: No  . Sexual activity: Not on file  Lifestyle  . Physical activity    Days per week: Not on file    Minutes per session: Not on file  . Stress: Not on file  Relationships  . Social [place] phone: Not on file    Gets together: Not on file    Attends religious service: Not on file    Active member of club or organization: Not on file    Attends meetings of clubs or organizations: Not on file    Relationship status: Not on file  . Intimate partner violence    Fear of current or ex partner: Not on file    Emotionally abused: Not on file    Physically abused: Not on file    Forced sexual activity: Not on file  Other Topics Concern  . Not on file  Social History Narrative  . Not on file    Review of Systems: Gen: Denies any fever, chills, sweats, anorexia, fatigue, weakness, malaise, weight loss, and sleep disorder HEENT: No visual complaints, No history of Retinopathy. Normal external appearance No Epistaxis or Sore throat. No sinusitis.   CV: Admitted with non-STEMI 01/08/2019 Resp: Denies dyspnea at rest, dyspnea with  exercise, cough, sputum, wheezing, coughing up blood, and pleurisy.  Using oxygen chest x-ray clear GI: Denies vomiting blood, jaundice, and fecal incontinence.   Denies dysphagia or odynophagia. GU : Denies  urinary burning, blood in urine, urinary frequency, urinary hesitancy, nocturnal urination, and urinary incontinence.  No renal calculi. MS: Denies joint pain, limitation of movement, and swelling, stiffness, low back pain, extremity pain. Denies muscle weakness, cramps, atrophy.  No use of non steroidal antiinflammatory drugs. Derm: Denies rash, itching, dry skin, hives, moles, warts, or unhealing ulcers.  Psych: Denies depression, anxiety, memory loss, suicidal ideation, hallucinations, paranoia, and confusion. Heme: Denies bruising, bleeding, and enlarged lymph nodes. Neuro: No headache.  No diplopia. No dysarthria.  No dysphasia.  No history of CVA.  No Seizures. No paresthesias.  No weakness. Endocrine history of diabetes.  No Thyroid disease.  No Adrenal disease.  Physical Exam: Vital signs in last 24 hours: Temp:  [97.7 F (36.5 C)-98.5 F (36.9 C)] 97.7 F (36.5 C) (07/04 0722) Pulse Rate:  [81-101] 86 (07/04 0800) Resp:  [0-27] 0 (07/04 0800) BP: (125-201)/(57-130) 125/57 (07/04 0800) SpO2:  [97 %-100 %] 100 % (07/04 0800) FiO2 (%):  [10 %] 10 % (07/03 2041) Weight:  [73.4 kg-76.2 kg] 73.4 kg (07/04 0500) Last BM Date: 01/07/19 General:   Chronically ill elderly lady nondistressed Head:  Normocephalic and atraumatic. Eyes:  Sclera clear, no icterus.   Conjunctiva pink. Ears:  Normal auditory acuity. Nose:  No deformity, discharge,  or lesions. Mouth:  No deformity or lesions, dentition normal. Neck:  Supple; no masses or thyromegaly. JVP not elevated Lungs:  Clear throughout to auscultation.   No wheezes, crackles, or rhonchi. No acute distress. Heart:  Regular rate and rhythm; no murmurs, clicks, rubs,  or gallops. Abdomen:  Soft, nontender and nondistended. No masses, hepatosplenomegaly or hernias noted. Normal bowel sounds, without guarding, and without rebound.   Msk:  Symmetrical without gross deformities. Normal posture. Pulses:  No carotid, renal, femoral bruits. DP  and PT symmetrical and equal Extremities: 1+ pitting edema lower extremities bilaterally Neurologic:  Alert and  oriented x4;  grossly normal neurologically. Skin:  Intact without significant lesions or rashes.   Intake/Output from previous day: 07/03 0701 - 07/04 0700 In: 492.5 [I.V.:492.5] Out: -  Intake/Output this shift: Total I/O In: 55 [I.V.:55] Out: -   Lab Results: Recent Labs    01/07/19 1850 01/08/19 0220  WBC 6.9 7.6  HGB 13.2 12.9  HCT 42.2 41.5  PLT 193 202   BMET Recent Labs    01/07/19 1850 01/08/19 0220  NA 139 137  K 4.4 4.9  CL 102 101  CO2 24 22  GLUCOSE 133* 182*  BUN 40* 41*  CREATININE 4.63* 4.74*  CALCIUM 9.0 8.7*   LFT No results for input(s): PROT, ALBUMIN, AST, ALT, ALKPHOS, BILITOT, BILIDIR, IBILI in the last 72 hours. PT/INR No results for input(s): LABPROT, INR in the last 72 hours. Hepatitis Panel No results for input(s): HEPBSAG, HCVAB, HEPAIGM, HEPBIGM in the last 72 hours.  Studies/Results: Dg Chest 2 View  Result Date: 01/07/2019 CLINICAL DATA:  Patient with generalized chest pain. EXAM: CHEST - 2 VIEW COMPARISON:  Chest radiograph 06/13/2018 FINDINGS: Central venous catheter tip projects over the right atrium. Stable cardiomegaly. Small bilateral pleural effusions. Similar-appearing bibasilar atelectasis. Similar-appearing interstitial opacities bilaterally. Thoracic spine degenerative changes. IMPRESSION: Cardiomegaly. Small bilateral pleural effusions and underlying atelectasis. Mild interstitial edema. Electronically Signed   By: Lovey Newcomer M.D.   On:  01/07/2019 18:24   Dg Chest Port 1v Same Day  Result Date: 01/07/2019 CLINICAL DATA:  Shortness of breath. EXAM: PORTABLE CHEST 1 VIEW COMPARISON:  Radiographs of January 07, 2019. FINDINGS: Stable cardiomegaly. Status post coronary artery bypass graft. Right internal jugular dialysis catheter is unchanged in position. No pneumothorax or pleural effusion is noted. No acute pulmonary  disease is noted. Bony thorax is unremarkable. IMPRESSION: No active disease. Electronically Signed   By: Marijo Conception M.D.   On: 01/07/2019 21:25    Assessment/Plan:  ESRD-Tuesday Thursday Saturday dialysis at [facility].  We will obtain records they have been requested.  We will continue dialysis 01/08/2019  ANEMIA-appears stable does not appear to be an issue at this time  MBD-we will need to find out dose of vitamin D appears that she is taking the binder auryxia 2 tablets with meals as an outpatient and will add this to her regimen  HTN/VOL-she appears to have some mild to moderate volume overload we will dialyze with fluid removal.  She remains on a heparin and nitroglycerin drip  ACCESS-she has a right hemodialysis catheter left fistula that is not being used  N STEMI history of coronary artery disease status post CABG.  Continues on heparin nitroglycerin.  Consider cardiology consultation   LOS: [place] _0 _1 :05 AM

## 2019-01-09 DIAGNOSIS — J9601 Acute respiratory failure with hypoxia: Secondary | ICD-10-CM

## 2019-01-09 DIAGNOSIS — R072 Precordial pain: Secondary | ICD-10-CM

## 2019-01-09 LAB — GLUCOSE, CAPILLARY
Glucose-Capillary: 122 mg/dL — ABNORMAL HIGH (ref 70–99)
Glucose-Capillary: 120 mg/dL — ABNORMAL HIGH (ref 70–99)
Glucose-Capillary: 132 mg/dL — ABNORMAL HIGH (ref 70–99)
Glucose-Capillary: 213 mg/dL — ABNORMAL HIGH (ref 70–99)

## 2019-01-09 LAB — HEPARIN LEVEL (UNFRACTIONATED): Heparin Unfractionated: 0.31 IU/mL (ref 0.30–0.70)

## 2019-01-09 LAB — CBC
HCT: 43.2 % (ref 36.0–46.0)
Hemoglobin: 13.7 g/dL (ref 12.0–15.0)
MCH: 29 pg (ref 26.0–34.0)
MCHC: 31.7 g/dL (ref 30.0–36.0)
MCV: 91.5 fL (ref 80.0–100.0)
Platelets: 205 10*3/uL (ref 150–400)
RBC: 4.72 MIL/uL (ref 3.87–5.11)
RDW: 19.2 % — ABNORMAL HIGH (ref 11.5–15.5)
WBC: 8.8 10*3/uL (ref 4.0–10.5)
nRBC: 0 % (ref 0.0–0.2)

## 2019-01-09 MED ORDER — ASPIRIN 81 MG PO CHEW
81.0000 mg | CHEWABLE_TABLET | ORAL | Status: AC
  Administered 2019-01-10: 81 mg via ORAL
  Filled 2019-01-09: qty 1

## 2019-01-09 MED ORDER — HYDRALAZINE HCL 20 MG/ML IJ SOLN
5.0000 mg | INTRAMUSCULAR | Status: DC | PRN
  Administered 2019-01-10: 5 mg via INTRAVENOUS
  Filled 2019-01-09: qty 1

## 2019-01-09 MED ORDER — ACETAMINOPHEN 325 MG PO TABS
650.0000 mg | ORAL_TABLET | Freq: Four times a day (QID) | ORAL | Status: DC | PRN
  Administered 2019-01-09 – 2019-01-11 (×2): 650 mg via ORAL
  Filled 2019-01-09 (×2): qty 2

## 2019-01-09 MED ORDER — LOSARTAN POTASSIUM 25 MG PO TABS
25.0000 mg | ORAL_TABLET | Freq: Every day | ORAL | Status: DC
  Administered 2019-01-09 – 2019-01-10 (×2): 25 mg via ORAL
  Filled 2019-01-09 (×2): qty 1

## 2019-01-09 MED ORDER — SODIUM CHLORIDE 0.9% FLUSH: 3.0000 mL | INTRAVENOUS | Status: DC | PRN

## 2019-01-09 MED ORDER — SODIUM CHLORIDE 0.9 % IV SOLN: 250.0000 mL | INTRAVENOUS | Status: DC | PRN

## 2019-01-09 MED ORDER — SODIUM CHLORIDE 0.9 % IV SOLN
INTRAVENOUS | Status: DC
  Administered 2019-01-10: 12:00:00 via INTRAVENOUS

## 2019-01-09 MED ORDER — HALOPERIDOL LACTATE 5 MG/ML IJ SOLN
2.0000 mg | Freq: Once | INTRAMUSCULAR | Status: AC
  Administered 2019-01-09: 2 mg via INTRAMUSCULAR
  Filled 2019-01-09: qty 1

## 2019-01-09 MED ORDER — ENOXAPARIN SODIUM 80 MG/0.8ML ~~LOC~~ SOLN
70.0000 mg | SUBCUTANEOUS | Status: DC
  Administered 2019-01-09 – 2019-01-10 (×2): 70 mg via SUBCUTANEOUS
  Filled 2019-01-09 (×2): qty 0.8

## 2019-01-09 MED ORDER — METOPROLOL SUCCINATE ER 25 MG PO TB24
25.0000 mg | ORAL_TABLET | Freq: Every day | ORAL | Status: DC
  Administered 2019-01-09 – 2019-01-11 (×3): 25 mg via ORAL
  Filled 2019-01-09 (×3): qty 1

## 2019-01-09 NOTE — Progress Notes (Signed)
Progress Note  Patient Name: Monica [name] Date of Encounter: 01/09/2019  Primary Cardiologist: Ena Dawley, MD   Subjective   Pt denies CP or dyspnea  Inpatient Medications    Scheduled Meds:  aspirin  324 mg Oral Once   aspirin EC  81 mg Oral Daily   atorvastatin  80 mg Oral Daily   Chlorhexidine Gluconate Cloth  6 each Topical Q0600   clopidogrel  75 mg Oral Daily   enoxaparin (LOVENOX) injection  70 mg Subcutaneous Q24H   ferric citrate  420 mg Oral TID WC   haloperidol lactate  2 mg Intramuscular Once   insulin aspart  0-9 Units Subcutaneous TID WC   latanoprost  1 drop Both Eyes QHS   mouth rinse  15 mL Mouth Rinse BID   metoprolol tartrate  25 mg Oral BID   sodium chloride flush  3 mL Intravenous Once   sodium chloride flush  3 mL Intravenous Q12H   Continuous Infusions:  sodium chloride 10 mL/hr at 01/08/19 1100   nitroGLYCERIN Stopped (01/08/19 0742)   PRN Meds: acetaminophen, albuterol, alum & mag hydroxide-simeth   Vital Signs    Vitals:   01/08/19 1932 01/08/19 2351 01/09/19 0000 01/09/19 0401  BP: (!) 154/67 (!) 144/59  (!) 156/74  Pulse: 95 77  89  Resp: 18 18  18   Temp: 98.4 F (36.9 C) 98.4 F (36.9 C)  98.2 F (36.8 C)  TempSrc: Oral Oral  Oral  SpO2: 99% 97%  100%  Weight:   71.9 kg   Height:        Intake/Output Summary (Last 24 hours) at 01/09/2019 1036 Last data filed at 01/09/2019 1914 Gross per 24 hour  Intake 751.06 ml  Output 2005 ml  Net -1253.94 ml   Last 3 Weights 01/09/2019 01/08/2019 01/08/2019  Weight (lbs) 158 lb 9.6 oz 157 lb 10.1 oz 163 lb 2.3 oz  Weight (kg) 71.94 kg 71.5 kg 74 kg      Telemetry    Sinus- Personally Reviewed   Physical Exam   GEN: No acute distress.   Neck: No JVD Cardiac: RRR, no murmurs, rubs, or gallops.  Respiratory: Clear to auscultation bilaterally. GI: Soft, nontender, non-distended  MS: No edema Neuro:  Nonfocal  Psych: Normal affect   Labs    High Sensitivity  Troponin:   Recent Labs  Lab 01/07/19 1850 01/07/19 2230 01/08/19 0220 01/08/19 0534  TROPONINIHS 195* 222* 220* 241*       Chemistry Recent Labs  Lab 01/07/19 1850 01/08/19 0220  NA 139 137  K 4.4 4.9  CL 102 101  CO2 24 22  GLUCOSE 133* 182*  BUN 40* 41*  CREATININE 4.63* 4.74*  CALCIUM 9.0 8.7*  GFRNONAA 8* 8*  GFRAA 9* 9*  ANIONGAP 13 14     Hematology Recent Labs  Lab 01/08/19 0220 01/08/19 1902 01/09/19 0216  WBC 7.6 9.3 8.8  RBC 4.47 4.63 4.72  HGB 12.9 13.5 13.7  HCT 41.5 41.8 43.2  MCV 92.8 90.3 91.5  MCH 28.9 29.2 29.0  MCHC 31.1 32.3 31.7  RDW 19.0* 18.8* 19.2*  PLT 202 213 205    BNP Recent Labs  Lab 01/07/19 1850  BNP 3,534.3*       Radiology    Dg Chest 2 View  Result Date: 01/07/2019 CLINICAL DATA:  Patient with generalized chest pain. EXAM: CHEST - 2 VIEW COMPARISON:  Chest radiograph 06/13/2018 FINDINGS: Central venous catheter tip projects over the right atrium.  Stable cardiomegaly. Small bilateral pleural effusions. Similar-appearing bibasilar atelectasis. Similar-appearing interstitial opacities bilaterally. Thoracic spine degenerative changes. IMPRESSION: Cardiomegaly. Small bilateral pleural effusions and underlying atelectasis. Mild interstitial edema. Electronically Signed   By: Lovey Newcomer M.D.   On: 01/07/2019 18:24   Dg Chest Port 1v Same Day  Result Date: 01/07/2019 CLINICAL DATA:  Shortness of breath. EXAM: PORTABLE CHEST 1 VIEW COMPARISON:  Radiographs of January 07, 2019. FINDINGS: Stable cardiomegaly. Status post coronary artery bypass graft. Right internal jugular dialysis catheter is unchanged in position. No pneumothorax or pleural effusion is noted. No acute pulmonary disease is noted. Bony thorax is unremarkable. IMPRESSION: No active disease. Electronically Signed   By: Marijo Conception M.D.   On: 01/07/2019 21:25    Patient Profile     85 year old female with past medical history of coronary artery disease status post  coronary artery bypass and graft, end-stage renal disease dialysis dependent, hypertension, diabetes mellitus, chronic diastolic heart failure for evaluation of chest pain.  Patient is status post coronary artery bypass and graft in 2017.  She had PCI of the saphenous vein graft to her obtuse marginal in December 2019 in [place].  Admitted with CP.  Troponins not consistent with acute coronary syndrome.  Echocardiogram shows newly reduced LV function with ejection fraction 30 to 35%, moderate left ventricular hypertrophy, biatrial enlargement and moderate mitral regurgitation.  There is mild aortic insufficiency.  Assessment & Plan    1 chest pain-Troponins are not consistent with acute coronary syndrome and may be mildly elevated because of renal insufficiency.  Electrocardiogram shows worse inferolateral ST depression.    Echocardiogram shows newly reduced LV function with ejection fraction 30 to 35%.  I recommended cardiac catheterization to be performed tomorrow.  This morning patient states she does not want to stay in the hospital.  I explained the risk of myocardial infarction and death and she still would like to leave.  We will plan to proceed with cardiac catheterization tomorrow if she remains in the hospital and is agreeable.  Continue aspirin, Plavix, statin and beta-blocker.  2 cardiomyopathy-newly diagnosed.  We will plan cardiac catheterization as outlined if patient agreeable.  Change metoprolol to Toprol 25 mg daily. Add losartan 25 mg daily.  Titrate medications as tolerated.  2 hyperlipidemia-continue statin.  3 hypertension-change medications as outlined above for newly diagnosed cardiomyopathy.  4 end-stage renal disease-dialysis per nephrology.  For questions or updates, please contact [place] Please consult [email or web address] for contact info under        Signed, Kirk Ruths, MD  01/09/2019, 10:36 AM

## 2019-01-09 NOTE — Progress Notes (Signed)
[facility] ROUNDING NOTE   Subjective:   This is a very pleasant 85 year old lady with history of myocardial infarction coronary artery bypass grafting in 2017.  End-stage renal disease Tuesday Thursday Saturday [place] dialysis.  She presented to the emergency room 01/07/2019 with non-STEMI..  She has been evaluated by Dr. Stanford Breed.  2D echo showed decreased ejection fraction 30 to 35%.  She underwent successful dialysis with ultrafiltration of 2 L 01/08/2019  Blood pressure  15674 pulse 89 temperature 98.2 O2 sats 100% room air  WBC 8.8 hemoglobin 13.7 platelets 206 sodium 137 potassium 4.9 chloride 101 CO2 22 BUN 41 creatinine 4.74 glucose 182  Aspirin 81 mg daily Lipitor 80 mg daily Plavix 25 mg daily Auryxia 2 tablets with meals, insulin sliding scale, Lopressor 25 mg twice daily  IV heparin IV nitroglycerin  Objective:  Vital signs in last 24 hours:  Temp:  [97.5 F (36.4 C)-98.4 F (36.9 C)] 98.2 F (36.8 C) (07/05 0401) Pulse Rate:  [77-95] 89 (07/05 0401) Resp:  [13-24] 18 (07/05 0401) BP: (119-162)/(59-105) 156/74 (07/05 0401) SpO2:  [95 %-100 %] 100 % (07/05 0401) Weight:  [71.5 kg-74 kg] 71.9 kg (07/05 0000)  Weight change: -2.404 kg Filed Weights   01/08/19 1448 01/08/19 1813 01/09/19 0000  Weight: 74 kg 71.5 kg 71.9 kg    Intake/Output: I/O last 3 completed shifts: In: 1315.5 [P.O.:480; I.V.:835.5] Out: 2005 [Other:2005]   Intake/Output this shift:  No intake/output data recorded.  CVS- RRR systolic murmur JVP not elevated RS- CTA no wheezes no rales ABD- BS present soft non-distended EXT-trace pitting edema lower extremities   Basic Metabolic Panel: Recent Labs  Lab 01/07/19 1850 01/08/19 0220  NA 139 137  K 4.4 4.9  CL 102 101  CO2 24 22  GLUCOSE 133* 182*  BUN 40* 41*  CREATININE 4.63* 4.74*  CALCIUM 9.0 8.7*    Liver Function Tests: No results for input(s): AST, ALT, ALKPHOS, BILITOT, PROT, ALBUMIN in the last 168  hours. No results for input(s): LIPASE, AMYLASE in the last 168 hours. No results for input(s): AMMONIA in the last 168 hours.  CBC: Recent Labs  Lab 01/07/19 1850 01/08/19 0220 01/08/19 1902 01/09/19 0216  WBC 6.9 7.6 9.3 8.8  HGB 13.2 12.9 13.5 13.7  HCT 42.2 41.5 41.8 43.2  MCV 93.2 92.8 90.3 91.5  PLT 193 202 213 205    Cardiac Enzymes: No results for input(s): CKTOTAL, CKMB, CKMBINDEX, TROPONINI in the last 168 hours.  BNP: Invalid input(s): POCBNP  CBG: Recent Labs  Lab 01/08/19 0725 01/08/19 1114 01/08/19 1832 01/08/19 2123 01/09/19 0646  GLUCAP 172* 199* 84 177* 122*    Microbiology: Results for orders placed or performed during the hospital encounter of 01/07/19  SARS Coronavirus 2 (Hosp order,Performed in [facility] lab via Abbott ID)     Status: None   Collection Time: 01/07/19  8:15 PM   Specimen: Dry Nasal Swab (Abbott ID Now)  Result Value Ref Range Status   SARS Coronavirus 2 (Abbott ID Now) NEGATIVE NEGATIVE Final    Comment: (NOTE) SARS-CoV-2 target nucleic acids are NOT DETECTED. The SARS-CoV-2 RNA is generally detectable in upper and lower respiratory specimens during the acute phase of infection.  Negativeresults do not preclude SARS-CoV-2 infection, do not rule out coinfections with other pathogens, and should not be used as the  sole basis for treatment or other patient management decisions.  Negative results must be combined with clinical observations, patient history, and epidemiological information.  The expected result is Negative. Fact Sheet for Patients: GolfingFamily.no Fact Sheet for Healthcare Providers: https://www.hernandez-brewer.com/ This test is not yet approved or cleared by the [place] FDA and  has been authorized for detection and/or diagnosis of SARS-CoV-2 by FDA under an Emergency Use Authorization (EUA).  This EUA will remain in effect (meaning this test can be used) for the  duration of  the COVID19 declaration under Section 5 64(b)(1) of the Act, 21 U.S.C.  section [phone] 3(b)(1), unless the authorization is terminated or revoked sooner. Performed at [facility], [address] [place], [place] [address]   MRSA PCR Screening     Status: None   Collection Time: 01/08/19  2:56 AM   Specimen: Nasal Mucosa; Nasopharyngeal  Result Value Ref Range Status   MRSA by PCR NEGATIVE NEGATIVE Final    Comment:        The GeneXpert MRSA Assay (FDA approved for NASAL specimens only), is one component of a comprehensive MRSA colonization surveillance program. It is not intended to diagnose MRSA infection nor to guide or monitor treatment for MRSA infections. Performed at [facility], [place] [address], [place], [place] [address]     Coagulation Studies: No results for input(s): LABPROT, INR in the last 72 hours.  Urinalysis: No results for input(s): COLORURINE, LABSPEC, PHURINE, GLUCOSEU, HGBUR, BILIRUBINUR, KETONESUR, PROTEINUR, UROBILINOGEN, NITRITE, LEUKOCYTESUR in the last 72 hours.  Invalid input(s): APPERANCEUR    Imaging: Dg Chest 2 View  Result Date: 01/07/2019 CLINICAL DATA:  Patient with generalized chest pain. EXAM: CHEST - 2 VIEW COMPARISON:  Chest radiograph 06/13/2018 FINDINGS: Central venous catheter tip projects over the right atrium. Stable cardiomegaly. Small bilateral pleural effusions. Similar-appearing bibasilar atelectasis. Similar-appearing interstitial opacities bilaterally. Thoracic spine degenerative changes. IMPRESSION: Cardiomegaly. Small bilateral pleural effusions and underlying atelectasis. Mild interstitial edema. Electronically Signed   By: Lovey Newcomer M.D.   On: 01/07/2019 18:24   Dg Chest Port 1v Same Day  Result Date: 01/07/2019 CLINICAL DATA:  Shortness of breath. EXAM: PORTABLE CHEST 1 VIEW COMPARISON:  Radiographs of January 07, 2019. FINDINGS: Stable cardiomegaly. Status post coronary artery bypass graft.  Right internal jugular dialysis catheter is unchanged in position. No pneumothorax or pleural effusion is noted. No acute pulmonary disease is noted. Bony thorax is unremarkable. IMPRESSION: No active disease. Electronically Signed   By: Marijo Conception M.D.   On: 01/07/2019 21:25     Medications:   . sodium chloride 10 mL/hr at 01/08/19 1100  . heparin 700 Units/hr (01/08/19 2304)  . nitroGLYCERIN Stopped (01/08/19 0742)   . aspirin  324 mg Oral Once  . aspirin EC  81 mg Oral Daily  . atorvastatin  80 mg Oral Daily  . Chlorhexidine Gluconate Cloth  6 each Topical Q0600  . clopidogrel  75 mg Oral Daily  . ferric citrate  420 mg Oral TID WC  . insulin aspart  0-9 Units Subcutaneous TID WC  . latanoprost  1 drop Both Eyes QHS  . mouth rinse  15 mL Mouth Rinse BID  . metoprolol tartrate  25 mg Oral BID  . sodium chloride flush  3 mL Intravenous Once  . sodium chloride flush  3 mL Intravenous Q12H   acetaminophen, albuterol, alum & mag hydroxide-simeth  Assessment/ Plan:   ESRD-Tuesday Thursday Saturday dialysis [place] underwent successful dialysis 01/08/2019 with ultrafiltration of over 2 L.  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  ACCESS-right hemodialysis cath the left fistula that is not being used  Status post end STEMI followed by Dr. Stanford Breed.  She has a history of CABG 2017 with last cath catheterization 06/2018 with stent to VG to OM.  She continues on aspirin and Plavix  Congestive heart failure with new systolic dysfunction await recommendations by Dr. Stanford Breed.  Hyperlipidemia continues on Lipitor 80 mg daily  Diabetes mellitus per primary team  History of peptic ulcer and GI bleed with stable hemoglobin   LOS: 2 Sherril Croon @TODAY @8 :50 AM

## 2019-01-09 NOTE — Progress Notes (Signed)
PROGRESS NOTE    Monica [name]  JIR:[id number] DOB: 30-Oct-1930 DOA: 01/07/2019 PCP: Benito Mccreedy, MD    Brief Narrative:  85 year old female with a history of previous MI, coronary artery bypass grafting 2 years ago, end-stage renal disease on dialysis awoke this morning with chest pressure that was not typical for her normal chest pain..  She took a nitroglycerin continued to have chest pressure through the morning.  She did have some diaphoresis.  She presented to the emergency room at [facility] with elevated troponins and clinical course consistent with non-STEMI.  She was started on heparin and nitroglycerin drip with relief of her chest pain improvement in her dyspnea and transferred to [facility] for further evaluation.  On evaluation here she says her chest pain is been relieved.  At [facility] she was on 15 L nasal cannula but was easily titrated to 6 L nasal cannula here.  On my evaluation she is awake alert able to converse in full sentences.  He does not appear dyspneic currently.  She tells me she does take a nitroglycerin about once every 3 to 4 months with complete pain relief.  Troponins were elevated at [facility] as high as 222.  BMP was remarkably normal save anion gap of 13 creatinine of 4.63 and BUN of 40.  BNP was 3534.  X-ray report from [facility] shows small bilateral effusions  Assessment & Plan:   Active Problems:   Chest pain   Acute respiratory failure (HCC)   NSTEMI (non-ST elevated myocardial infarction) ([place])  1.  Non-STEMI:  -No chest pain presently -Cardiology following. Pt with new EF of 30-35% with recommendation for follow up heart cath -This AM, pt initially wanted to go home. After speaking with pt, she now understands need for cardiac intervention is agreeable to proceed with study. Pt seems alert and oriented this AM -Repeat bmet in AM -Continued on therapeutic lovenox as pt lost IV access this AM and  heparin gtt therefore d/c'd 2.  End-stage renal disease on dialysis.   -Nephrology following -To continue TTS HD 3.  Hypertension:  -Suboptimally controlled -Currently on metoprolol with ARB started per Cardiology -Will add PRN hydralazine -Cont to titrate BP med as tolerated 4.  Hypoxemia -following HD, now on room air -Resolved 4.  Bilateral effusions - stable cardiomegaly noted on most recent CXR, reviewed - Clinically stable at this time  DVT prophylaxis: Therapeutic Lovenox Code Status: Full Family Communication: pt in room, family not at bedside Disposition Plan: Uncertain at this time  Consultants:   Cardiology  PCCM  Procedures:     Antimicrobials: Anti-infectives (From admission, onward)   None       Subjective: Eager to go home soon  Objective: Vitals:   01/08/19 2351 01/09/19 0000 01/09/19 0401 01/09/19 1159  BP: (!) 144/59  (!) 156/74 (!) 168/86  Pulse: 77  89 81  Resp: 18  18 17   Temp: 98.4 F (36.9 C)  98.2 F (36.8 C) 98.1 F (36.7 C)  TempSrc: Oral  Oral Oral  SpO2: 97%  100% 95%  Weight:  71.9 kg    Height:        Intake/Output Summary (Last 24 hours) at 01/09/2019 1358 Last data filed at 01/09/2019 0642 Gross per 24 hour  Intake 734.07 ml  Output 2005 ml  Net -1270.93 ml   Filed Weights   01/08/19 1448 01/08/19 1813 01/09/19 0000  Weight: 74 kg 71.5 kg 71.9 kg  Examination:  General exam: Appears calm and comfortable  Respiratory system: Clear to auscultation. Respiratory effort normal. Cardiovascular system: S1 & S2 heard, RRR Gastrointestinal system: Abdomen is nondistended, soft and nontender. No organomegaly or masses felt. Normal bowel sounds heard. Central nervous system: Alert and oriented. No focal neurological deficits. Extremities: Symmetric 5 x 5 power. Skin: No rashes, lesions  Psychiatry: Judgement and insight appear normal. Mood & affect appropriate.   Data Reviewed: I have personally reviewed following labs  and imaging studies  CBC: Recent Labs  Lab 01/07/19 1850 01/08/19 0220 01/08/19 1902 01/09/19 0216  WBC 6.9 7.6 9.3 8.8  HGB 13.2 12.9 13.5 13.7  HCT 42.2 41.5 41.8 43.2  MCV 93.2 92.8 90.3 91.5  PLT 193 202 213 683   Basic Metabolic Panel: Recent Labs  Lab 01/07/19 1850 01/08/19 0220  NA 139 137  K 4.4 4.9  CL 102 101  CO2 24 22  GLUCOSE 133* 182*  BUN 40* 41*  CREATININE 4.63* 4.74*  CALCIUM 9.0 8.7*   GFR: Estimated Creatinine Clearance: 7.9 mL/min (A) (by C-G formula based on SCr of 4.74 mg/dL (H)). Liver Function Tests: No results for input(s): AST, ALT, ALKPHOS, BILITOT, PROT, ALBUMIN in the last 168 hours. No results for input(s): LIPASE, AMYLASE in the last 168 hours. No results for input(s): AMMONIA in the last 168 hours. Coagulation Profile: No results for input(s): INR, PROTIME in the last 168 hours. Cardiac Enzymes: No results for input(s): CKTOTAL, CKMB, CKMBINDEX, TROPONINI in the last 168 hours. BNP (last 3 results) No results for input(s): PROBNP in the last 8760 hours. HbA1C: Recent Labs    01/08/19 0220  HGBA1C 7.0*   CBG: Recent Labs  Lab 01/08/19 1114 01/08/19 1832 01/08/19 2123 01/09/19 0646 01/09/19 1155  GLUCAP 199* 84 177* 122* 120*   Lipid Profile: No results for input(s): CHOL, HDL, LDLCALC, TRIG, CHOLHDL, LDLDIRECT in the last 72 hours. Thyroid Function Tests: No results for input(s): TSH, T4TOTAL, FREET4, T3FREE, THYROIDAB in the last 72 hours. Anemia Panel: No results for input(s): VITAMINB12, FOLATE, FERRITIN, TIBC, IRON, RETICCTPCT in the last 72 hours. Sepsis Labs: No results for input(s): PROCALCITON, LATICACIDVEN in the last 168 hours.  Recent Results (from the past 240 hour(s))  SARS Coronavirus 2 (Hosp order,Performed in [facility] lab via Abbott ID)     Status: None   Collection Time: 01/07/19  8:15 PM   Specimen: Dry Nasal Swab (Abbott ID Now)  Result Value Ref Range Status   SARS Coronavirus 2 (Abbott ID  Now) NEGATIVE NEGATIVE Final    Comment: (NOTE) SARS-CoV-2 target nucleic acids are NOT DETECTED. The SARS-CoV-2 RNA is generally detectable in upper and lower respiratory specimens during the acute phase of infection.  Negativeresults do not preclude SARS-CoV-2 infection, do not rule out coinfections with other pathogens, and should not be used as the  sole basis for treatment or other patient management decisions.  Negative results must be combined with clinical observations, patient history, and epidemiological information. The expected result is Negative. Fact Sheet for Patients: GolfingFamily.no Fact Sheet for Healthcare Providers: https://www.hernandez-brewer.com/ This test is not yet approved or cleared by the [place] FDA and  has been authorized for detection and/or diagnosis of SARS-CoV-2 by FDA under an Emergency Use Authorization (EUA).  This EUA will remain in effect (meaning this test can be used) for the duration of  the COVID19 declaration under Section 5 64(b)(1) of the Act, 21 U.S.C.  section [phone] 3(b)(1), unless the authorization is  terminated or revoked sooner. Performed at [facility], [address] [place], [place] [address]   MRSA PCR Screening     Status: None   Collection Time: 01/08/19  2:56 AM   Specimen: Nasal Mucosa; Nasopharyngeal  Result Value Ref Range Status   MRSA by PCR NEGATIVE NEGATIVE Final    Comment:        The GeneXpert MRSA Assay (FDA approved for NASAL specimens only), is one component of a comprehensive MRSA colonization surveillance program. It is not intended to diagnose MRSA infection nor to guide or monitor treatment for MRSA infections. Performed at [facility], [place] [address], [place], [place] [address]      Radiology Studies: Dg Chest 2 View  Result Date: 01/07/2019 CLINICAL DATA:  Patient with generalized chest pain. EXAM: CHEST - 2 VIEW COMPARISON:  Chest  radiograph 06/13/2018 FINDINGS: Central venous catheter tip projects over the right atrium. Stable cardiomegaly. Small bilateral pleural effusions. Similar-appearing bibasilar atelectasis. Similar-appearing interstitial opacities bilaterally. Thoracic spine degenerative changes. IMPRESSION: Cardiomegaly. Small bilateral pleural effusions and underlying atelectasis. Mild interstitial edema. Electronically Signed   By: Lovey Newcomer M.D.   On: 01/07/2019 18:24   Dg Chest Port 1v Same Day  Result Date: 01/07/2019 CLINICAL DATA:  Shortness of breath. EXAM: PORTABLE CHEST 1 VIEW COMPARISON:  Radiographs of January 07, 2019. FINDINGS: Stable cardiomegaly. Status post coronary artery bypass graft. Right internal jugular dialysis catheter is unchanged in position. No pneumothorax or pleural effusion is noted. No acute pulmonary disease is noted. Bony thorax is unremarkable. IMPRESSION: No active disease. Electronically Signed   By: Marijo Conception M.D.   On: 01/07/2019 21:25    Scheduled Meds:  aspirin  324 mg Oral Once   aspirin EC  81 mg Oral Daily   atorvastatin  80 mg Oral Daily   Chlorhexidine Gluconate Cloth  6 each Topical Q0600   clopidogrel  75 mg Oral Daily   enoxaparin (LOVENOX) injection  70 mg Subcutaneous Q24H   ferric citrate  420 mg Oral TID WC   insulin aspart  0-9 Units Subcutaneous TID WC   latanoprost  1 drop Both Eyes QHS   losartan  25 mg Oral Daily   mouth rinse  15 mL Mouth Rinse BID   metoprolol succinate  25 mg Oral Daily   sodium chloride flush  3 mL Intravenous Once   sodium chloride flush  3 mL Intravenous Q12H   Continuous Infusions:  sodium chloride 10 mL/hr at 01/08/19 1100   nitroGLYCERIN Stopped (01/08/19 0742)     LOS: 2 days   Marylu Lund, MD [facility] Pager On Amion  If 7PM-7AM, please contact night-coverage 01/09/2019, 1:58 PM

## 2019-01-09 NOTE — Progress Notes (Addendum)
ANTICOAGULATION CONSULT NOTE - Follow Up Consult  Pharmacy Consult for Heparin>>Lovenox Indication: chest pain/ACS  No Known Allergies  Patient Measurements: Height: 5\' 2"  (157.5 cm) Weight: 158 lb 9.6 oz (71.9 kg) IBW/kg (Calculated) : 50.1 Heparin Dosing Weight:  66 kg  Vital Signs: Temp: 98.2 F (36.8 C) (07/05 0401) Temp Source: Oral (07/05 0401) BP: 156/74 (07/05 0401) Pulse Rate: 89 (07/05 0401)  Labs: Recent Labs    01/07/19 1850 01/07/19 2230 01/08/19 0220 01/08/19 0534 01/08/19 1526 01/08/19 1902 01/09/19 0216  HGB 13.2  --  12.9  --   --  13.5 13.7  HCT 42.2  --  41.5  --   --  41.8 43.2  PLT 193  --  202  --   --  213 205  HEPARINUNFRC  --   --   --  0.85* 0.47  --  0.31  CREATININE 4.63*  --  4.74*  --   --   --   --   TROPONINIHS 195* 222* 220* 241*  --   --   --     Estimated Creatinine Clearance: 7.9 mL/min (A) (by C-G formula based on SCr of 4.74 mg/dL (H)).    Assessment: Anticoag: Heparin for ACS, Troponins elevated. HL 0.47>0.31 remains in goal range. Hgb 13.7, and plts 205 both stable.  Goal of Therapy:  Heparin level 0.3-0.7 units/ml Monitor platelets by anticoagulation protocol: Yes   Plan:  - Cont Hep at 700 units/hr (7 ml/hr)>>lost IV>>Lovenox 70mg /24h - CBC q 72h on LMWH - Possibly cath 7/6.   Graves Nipp S. Alford Highland, PharmD, BCPS Clinical Staff Pharmacist Eilene Ghazi Stillinger 01/09/2019,8:41 AM

## 2019-01-10 ENCOUNTER — Other Ambulatory Visit: Payer: Self-pay

## 2019-01-10 ENCOUNTER — Encounter (HOSPITAL_COMMUNITY)
Admission: EM | Disposition: A | Discharge status: Home / Self Care | Payer: Self-pay | Source: Home / Self Care | Attending: Internal Medicine

## 2019-01-10 DIAGNOSIS — I5021 Acute systolic (congestive) heart failure: Secondary | ICD-10-CM

## 2019-01-10 DIAGNOSIS — I25710 Atherosclerosis of autologous vein coronary artery bypass graft(s) with unstable angina pectoris: Secondary | ICD-10-CM

## 2019-01-10 DIAGNOSIS — I214 Non-ST elevation (NSTEMI) myocardial infarction: Principal | ICD-10-CM

## 2019-01-10 DIAGNOSIS — N186 End stage renal disease: Secondary | ICD-10-CM

## 2019-01-10 DIAGNOSIS — I5041 Acute combined systolic (congestive) and diastolic (congestive) heart failure: Secondary | ICD-10-CM

## 2019-01-10 HISTORY — PX: CORONARY STENT INTERVENTION: CATH118234

## 2019-01-10 HISTORY — PX: LEFT HEART CATH AND CORS/GRAFTS ANGIOGRAPHY: CATH118250

## 2019-01-10 LAB — POCT ACTIVATED CLOTTING TIME
Activated Clotting Time: 274 seconds
Activated Clotting Time: 285 seconds
Activated Clotting Time: 191 seconds
Activated Clotting Time: 169 seconds

## 2019-01-10 LAB — BASIC METABOLIC PANEL
Anion gap: 11 (ref 5–15)
BUN: 37 mg/dL — ABNORMAL HIGH (ref 8–23)
CO2: 25 mmol/L (ref 22–32)
Calcium: 8.7 mg/dL — ABNORMAL LOW (ref 8.9–10.3)
Chloride: 100 mmol/L (ref 98–111)
Creatinine, Ser: 4.28 mg/dL — ABNORMAL HIGH (ref 0.44–1.00)
GFR calc Af Amer: 10 mL/min — ABNORMAL LOW (ref >60)
GFR calc non Af Amer: 9 mL/min — ABNORMAL LOW (ref >60)
Glucose, Bld: 146 mg/dL — ABNORMAL HIGH (ref 70–99)
Potassium: 4.1 mmol/L (ref 3.5–5.1)
Sodium: 136 mmol/L (ref 135–145)

## 2019-01-10 LAB — GLUCOSE, CAPILLARY
Glucose-Capillary: 131 mg/dL — ABNORMAL HIGH (ref 70–99)
Glucose-Capillary: 102 mg/dL — ABNORMAL HIGH (ref 70–99)
Glucose-Capillary: 114 mg/dL — ABNORMAL HIGH (ref 70–99)

## 2019-01-10 LAB — CBC
HCT: 38.6 % (ref 36.0–46.0)
Hemoglobin: 12.4 g/dL (ref 12.0–15.0)
MCH: 28.8 pg (ref 26.0–34.0)
MCHC: 32.1 g/dL (ref 30.0–36.0)
MCV: 89.6 fL (ref 80.0–100.0)
Platelets: 196 10*3/uL (ref 150–400)
RBC: 4.31 MIL/uL (ref 3.87–5.11)
RDW: 18.6 % — ABNORMAL HIGH (ref 11.5–15.5)
WBC: 6.3 10*3/uL (ref 4.0–10.5)
nRBC: 0 % (ref 0.0–0.2)

## 2019-01-10 SURGERY — LEFT HEART CATH AND CORS/GRAFTS ANGIOGRAPHY
Anesthesia: LOCAL

## 2019-01-10 MED ORDER — HEPARIN (PORCINE) IN NACL 1000-0.9 UT/500ML-% IV SOLN
INTRAVENOUS | Status: DC | PRN
  Administered 2019-01-10 (×2): 500 mL

## 2019-01-10 MED ORDER — LABETALOL HCL 5 MG/ML IV SOLN
INTRAVENOUS | Status: AC
  Filled 2019-01-10: qty 4

## 2019-01-10 MED ORDER — HYDRALAZINE HCL 20 MG/ML IJ SOLN
INTRAMUSCULAR | Status: DC | PRN
  Administered 2019-01-10: 10 mg via INTRAVENOUS

## 2019-01-10 MED ORDER — HEPARIN SODIUM (PORCINE) 1000 UNIT/ML IJ SOLN
INTRAMUSCULAR | Status: DC | PRN
  Administered 2019-01-10: 6000 [IU] via INTRAVENOUS

## 2019-01-10 MED ORDER — LABETALOL HCL 5 MG/ML IV SOLN
10.0000 mg | INTRAVENOUS | Status: AC | PRN
  Administered 2019-01-10: 10 mg via INTRAVENOUS

## 2019-01-10 MED ORDER — FENTANYL CITRATE (PF) 100 MCG/2ML IJ SOLN
INTRAMUSCULAR | Status: AC
  Filled 2019-01-10: qty 2

## 2019-01-10 MED ORDER — LOSARTAN POTASSIUM 25 MG PO TABS
25.0000 mg | ORAL_TABLET | Freq: Every day | ORAL | Status: DC
  Administered 2019-01-11: 25 mg via ORAL
  Filled 2019-01-10: qty 1

## 2019-01-10 MED ORDER — LIDOCAINE HCL (PF) 1 % IJ SOLN
INTRAMUSCULAR | Status: DC | PRN
  Administered 2019-01-10: 10 mL

## 2019-01-10 MED ORDER — IOHEXOL 350 MG/ML SOLN
INTRAVENOUS | Status: DC | PRN
  Administered 2019-01-10: 205 mL via INTRAVENOUS

## 2019-01-10 MED ORDER — CHLORHEXIDINE GLUCONATE CLOTH 2 % EX PADS
6.0000 | MEDICATED_PAD | Freq: Every day | CUTANEOUS | Status: DC
  Administered 2019-01-11: 6 via TOPICAL

## 2019-01-10 MED ORDER — SODIUM CHLORIDE 0.9% FLUSH
3.0000 mL | Freq: Two times a day (BID) | INTRAVENOUS | Status: DC
  Administered 2019-01-10: 3 mL via INTRAVENOUS

## 2019-01-10 MED ORDER — SODIUM CHLORIDE 0.9 % IV SOLN: 250.0000 mL | INTRAVENOUS | Status: DC | PRN

## 2019-01-10 MED ORDER — ONDANSETRON HCL 4 MG/2ML IJ SOLN: 4.0000 mg | Freq: Four times a day (QID) | INTRAMUSCULAR | Status: DC | PRN

## 2019-01-10 MED ORDER — ISOSORB DINITRATE-HYDRALAZINE 20-37.5 MG PO TABS
1.0000 | ORAL_TABLET | Freq: Two times a day (BID) | ORAL | Status: DC
  Administered 2019-01-10 – 2019-01-11 (×3): 1 via ORAL
  Filled 2019-01-10 (×3): qty 1

## 2019-01-10 MED ORDER — ENOXAPARIN SODIUM 30 MG/0.3ML ~~LOC~~ SOLN: 30.0000 mg | SUBCUTANEOUS | Status: DC

## 2019-01-10 MED ORDER — NITROGLYCERIN 1 MG/10 ML FOR IR/CATH LAB
INTRA_ARTERIAL | Status: AC
  Filled 2019-01-10: qty 10

## 2019-01-10 MED ORDER — HYDRALAZINE HCL 20 MG/ML IJ SOLN
INTRAMUSCULAR | Status: AC
  Filled 2019-01-10: qty 1

## 2019-01-10 MED ORDER — LIDOCAINE HCL (PF) 1 % IJ SOLN
INTRAMUSCULAR | Status: AC
  Filled 2019-01-10: qty 30

## 2019-01-10 MED ORDER — HEPARIN SODIUM (PORCINE) 1000 UNIT/ML IJ SOLN
INTRAMUSCULAR | Status: AC
  Filled 2019-01-10: qty 1

## 2019-01-10 MED ORDER — LABETALOL HCL 5 MG/ML IV SOLN
INTRAVENOUS | Status: DC | PRN
  Administered 2019-01-10 (×2): 10 mg via INTRAVENOUS

## 2019-01-10 MED ORDER — NITROGLYCERIN 1 MG/10 ML FOR IR/CATH LAB
INTRA_ARTERIAL | Status: DC | PRN
  Administered 2019-01-10 (×2): 200 ug via INTRACORONARY

## 2019-01-10 MED ORDER — MIDAZOLAM HCL 2 MG/2ML IJ SOLN
INTRAMUSCULAR | Status: AC
  Filled 2019-01-10: qty 2

## 2019-01-10 MED ORDER — ENOXAPARIN SODIUM 40 MG/0.4ML ~~LOC~~ SOLN: 40.0000 mg | SUBCUTANEOUS | Status: DC

## 2019-01-10 MED ORDER — FENTANYL CITRATE (PF) 100 MCG/2ML IJ SOLN
INTRAMUSCULAR | Status: DC | PRN
  Administered 2019-01-10: 25 ug via INTRAVENOUS

## 2019-01-10 MED ORDER — MIDAZOLAM HCL 2 MG/2ML IJ SOLN
INTRAMUSCULAR | Status: DC | PRN
  Administered 2019-01-10: 1 mg via INTRAVENOUS

## 2019-01-10 MED ORDER — SODIUM CHLORIDE 0.9% FLUSH: 3.0000 mL | INTRAVENOUS | Status: DC | PRN

## 2019-01-10 MED ORDER — HEPARIN (PORCINE) IN NACL 1000-0.9 UT/500ML-% IV SOLN
INTRAVENOUS | Status: AC
  Filled 2019-01-10: qty 1000

## 2019-01-10 SURGICAL SUPPLY — 22 items
BALLN SAPPHIRE 2.0X15 (BALLOONS) ×2
BALLN SAPPHIRE ~~LOC~~ 2.5X12 (BALLOONS) ×2 IMPLANT
BALLN ~~LOC~~ EUPHORA RX 2.75X15 (BALLOONS) ×2
BALLOON SAPPHIRE 2.0X15 (BALLOONS) ×1 IMPLANT
BALLOON ~~LOC~~ EUPHORA RX 2.75X15 (BALLOONS) ×1 IMPLANT
CATH EXPO 5F MPA-1 (CATHETERS) ×2 IMPLANT
CATH INFINITI 5FR MULTPACK ANG (CATHETERS) ×2 IMPLANT
CATH LAUNCHER 6FR AL1 (CATHETERS) ×1 IMPLANT
CATHETER LAUNCHER 6FR AL1 (CATHETERS) ×2
COVER DOME SNAP 22 D (MISCELLANEOUS) ×2 IMPLANT
KIT ENCORE 26 ADVANTAGE (KITS) ×2 IMPLANT
KIT HEART LEFT (KITS) ×2 IMPLANT
PACK CARDIAC CATHETERIZATION (CUSTOM PROCEDURE TRAY) ×2 IMPLANT
SHEATH PINNACLE 5F 10CM (SHEATH) ×2 IMPLANT
SHEATH PINNACLE 6F 10CM (SHEATH) ×2 IMPLANT
STENT SYNERGY DES 2.5X12 (Permanent Stent) ×2 IMPLANT
STENT SYNERGY DES 2.5X20 (Permanent Stent) ×2 IMPLANT
TRANSDUCER W/STOPCOCK (MISCELLANEOUS) ×2 IMPLANT
TUBING CIL FLEX 10 FLL-RA (TUBING) ×4 IMPLANT
WIRE ASAHI PROWATER 180CM (WIRE) ×2 IMPLANT
WIRE EMERALD 3MM-J .035X150CM (WIRE) ×2 IMPLANT
WIRE HI TORQ VERSACORE-J 145CM (WIRE) ×2 IMPLANT

## 2019-01-10 NOTE — Interval H&P Note (Signed)
History and Physical Interval Note:  01/10/2019 2:17 PM  Monica [name]  has presented today for surgery, with the diagnosis of NSTEMI.  The various methods of treatment have been discussed with the patient and family. After consideration of risks, benefits and other options for treatment, the patient has consented to  Procedure(s): LEFT HEART CATH AND CORS/GRAFTS ANGIOGRAPHY (N/A)  PERCUTANEOUS CORONARY INTERVENTION  as a surgical intervention.  The patient's history has been reviewed, patient examined, no change in status, stable for surgery.  I have reviewed the patient's chart and labs.  Questions were answered to the patient's satisfaction.    Cath Lab Visit (complete for each Cath Lab visit)  Clinical Evaluation Leading to the Procedure:   ACS: Yes.    Non-ACS:    Anginal Classification: CCS III  Anti-ischemic medical therapy: Minimal Therapy (1 class of medications)  Non-Invasive Test Results: Intermediate-risk stress test findings: cardiac mortality 1-3%/year; newly reduced EF on echo  Prior CABG: Previous CABG  Monica [name]

## 2019-01-10 NOTE — H&P (View-Only) (Signed)
Progress Note  Patient Name: Monica [name] Date of Encounter: 01/10/2019  Primary Cardiologist: Ena Dawley, MD   Subjective   Patient doing well today. She denies CP or SOB. Patient's daughter with her in the room.   Inpatient Medications    Scheduled Meds: . aspirin  324 mg Oral Once  . aspirin EC  81 mg Oral Daily  . atorvastatin  80 mg Oral Daily  . Chlorhexidine Gluconate Cloth  6 each Topical Q0600  . clopidogrel  75 mg Oral Daily  . enoxaparin (LOVENOX) injection  70 mg Subcutaneous Q24H  . ferric citrate  420 mg Oral TID WC  . insulin aspart  0-9 Units Subcutaneous TID WC  . latanoprost  1 drop Both Eyes QHS  . losartan  25 mg Oral Daily  . mouth rinse  15 mL Mouth Rinse BID  . metoprolol succinate  25 mg Oral Daily  . sodium chloride flush  3 mL Intravenous Once  . sodium chloride flush  3 mL Intravenous Q12H   Continuous Infusions: . sodium chloride 10 mL/hr at 01/08/19 1100  . sodium chloride    . sodium chloride    . nitroGLYCERIN Stopped (01/08/19 0742)   PRN Meds: sodium chloride, acetaminophen, albuterol, alum & mag hydroxide-simeth, hydrALAZINE, sodium chloride flush   Vital Signs    Vitals:   01/10/19 0111 01/10/19 0438 01/10/19 0733 01/10/19 0833  BP: (!) 169/89 (!) 164/74 (!) 159/69 (!) 182/85  Pulse: 88 85 86 84  Resp: 18 18 17    Temp: 98.1 F (36.7 C) 98.2 F (36.8 C) 98.3 F (36.8 C)   TempSrc: Oral Oral Oral   SpO2: 99% 98% 100% 99%  Weight:  72.4 kg    Height:        Intake/Output Summary (Last 24 hours) at 01/10/2019 0926 Last data filed at 01/10/2019 0847 Gross per 24 hour  Intake 64.67 ml  Output -  Net 64.67 ml   Last 3 Weights 01/10/2019 01/09/2019 01/08/2019  Weight (lbs) 159 lb 9.6 oz 158 lb 9.6 oz 157 lb 10.1 oz  Weight (kg) 72.394 kg 71.94 kg 71.5 kg      Telemetry    NSR, HR 80s, no arrhythmias noted - Personally Reviewed  ECG    NSR, 87 bpm, ST depressions/T wave inversion infra lateral leads  - Personally Reviewed  Physical Exam   GEN: No acute distress.   Neck: No JVD Cardiac: RRR, no murmurs, rubs, or gallops.  Respiratory: Clear to auscultation bilaterally. GI: Soft, nontender, non-distended  MS: No edema; No deformity. Neuro:  Nonfocal  Psych: Normal affect   Labs    High Sensitivity Troponin:   Recent Labs  Lab 01/07/19 1850 01/07/19 2230 01/08/19 0220 01/08/19 0534  TROPONINIHS 195* 222* 220* 241*      Cardiac EnzymesNo results for input(s): TROPONINI in the last 168 hours. No results for input(s): TROPIPOC in the last 168 hours.   Chemistry Recent Labs  Lab 01/07/19 1850 01/08/19 0220 01/10/19 0453  NA 139 137 136  K 4.4 4.9 4.1  CL 102 101 100  CO2 24 22 25   GLUCOSE 133* 182* 146*  BUN 40* 41* 37*  CREATININE 4.63* 4.74* 4.28*  CALCIUM 9.0 8.7* 8.7*  GFRNONAA 8* 8* 9*  GFRAA 9* 9* 10*  ANIONGAP 13 14 11      Hematology Recent Labs  Lab 01/08/19 1902 01/09/19 0216 01/10/19 0453  WBC 9.3 8.8 6.3  RBC 4.63 4.72 4.31  HGB 13.5 13.7 12.4  HCT 41.8 43.2 38.6  MCV 90.3 91.5 89.6  MCH 29.2 29.0 28.8  MCHC 32.3 31.7 32.1  RDW 18.8* 19.2* 18.6*  PLT 213 205 196    BNP Recent Labs  Lab 01/07/19 1850  BNP 3,534.3*     DDimer No results for input(s): DDIMER in the last 168 hours.   Radiology    No results found.  Cardiac Studies   Echo 01/08/2019  1. The left ventricle has moderate-severely reduced systolic function, with an ejection fraction of 30-35%. The cavity size was normal. There is moderate concentric left ventricular hypertrophy. Left ventricular diastolic Doppler parameters are  consistent with pseudonormalization. Elevated left atrial and left ventricular end-diastolic pressures.  2. The right ventricle has moderately reduced systolic function. The cavity was normal. There is no increase in right ventricular wall thickness. Right ventricular systolic pressure is normal.  3. Left atrial size was moderately dilated.  4. Right atrial size was  mildly dilated.  5. The mitral valve is degenerative. Moderate thickening of the mitral valve leaflet. There is mild mitral annular calcification present. Mitral valve regurgitation is moderate by color flow Doppler.  6. The aortic valve is tricuspid. Mild thickening of the aortic valve. Aortic valve regurgitation is mild by color flow Doppler.  Patient Profile     85 y.o. female with hx of coronary artery disease status post coronary artery bypass and graft in 2017, Left heart cath with NSTEMI 06/2018 and stent to VG to OM, ESRD on HD, hypertension, diabetes mellitus, chronic diastolic heart failure for evaluation of chest pain.   Echocardiogram shows newly reduced LV function with ejection fraction 30 to 35%, moderate left ventricular hypertrophy, biatrial enlargement and moderate mitral regurgitation.  There is mild aortic insufficiency. Patient scheduled for Left hear Cath today.  Assessment & Plan    1.Chest pain/Elevated Troponin - Possibly in the setting of ESRD on HD -HS troponin 7/4 220>>240 -EKG shows NSR with inferolateral ST depression/T wave inversion  - Left heart cath scheduled for today to rule out ischemia -on IV heparin -No active Chest pain  2. Chronic Diastolic Heart Faiure -Echo with newly reduced LVEF 30-35% -BNP elevated in ER 3534 -patient denies sob, euvolemic on exam -Begin Bidil  3. ESRD on [facility] -per nephrology -HD T, Th, Sat  4. HTN - still slightly elevated -metoprolol succinate 25 mg once daily -continue losartan 25 mg once daily -start patient on Bidil for better control -continue to monitor  5. Hyperlipidemia -Lipitor 80 mg, home med  For questions or updates, please contact [place] Please consult [email or web address] for contact info under        Signed, Evaleigh Mccamy Ninfa Meeker, PA-C  01/10/2019, 9:26 AM

## 2019-01-10 NOTE — Progress Notes (Signed)
IV team consult placed. Upon arrival patient was receiving EKG. Nurse stated next shift would place IV.

## 2019-01-10 NOTE — Progress Notes (Signed)
Progress Note  Patient Name: Monica [name] Date of Encounter: 01/10/2019  Primary Cardiologist: Ena Dawley, MD   Subjective   Patient doing well today. She denies CP or SOB. Patient's daughter with her in the room.   Inpatient Medications    Scheduled Meds: . aspirin  324 mg Oral Once  . aspirin EC  81 mg Oral Daily  . atorvastatin  80 mg Oral Daily  . Chlorhexidine Gluconate Cloth  6 each Topical Q0600  . clopidogrel  75 mg Oral Daily  . enoxaparin (LOVENOX) injection  70 mg Subcutaneous Q24H  . ferric citrate  420 mg Oral TID WC  . insulin aspart  0-9 Units Subcutaneous TID WC  . latanoprost  1 drop Both Eyes QHS  . losartan  25 mg Oral Daily  . mouth rinse  15 mL Mouth Rinse BID  . metoprolol succinate  25 mg Oral Daily  . sodium chloride flush  3 mL Intravenous Once  . sodium chloride flush  3 mL Intravenous Q12H   Continuous Infusions: . sodium chloride 10 mL/hr at 01/08/19 1100  . sodium chloride    . sodium chloride    . nitroGLYCERIN Stopped (01/08/19 0742)   PRN Meds: sodium chloride, acetaminophen, albuterol, alum & mag hydroxide-simeth, hydrALAZINE, sodium chloride flush   Vital Signs    Vitals:   01/10/19 0111 01/10/19 0438 01/10/19 0733 01/10/19 0833  BP: (!) 169/89 (!) 164/74 (!) 159/69 (!) 182/85  Pulse: 88 85 86 84  Resp: 18 18 17    Temp: 98.1 F (36.7 C) 98.2 F (36.8 C) 98.3 F (36.8 C)   TempSrc: Oral Oral Oral   SpO2: 99% 98% 100% 99%  Weight:  72.4 kg    Height:        Intake/Output Summary (Last 24 hours) at 01/10/2019 0926 Last data filed at 01/10/2019 0847 Gross per 24 hour  Intake 64.67 ml  Output -  Net 64.67 ml   Last 3 Weights 01/10/2019 01/09/2019 01/08/2019  Weight (lbs) 159 lb 9.6 oz 158 lb 9.6 oz 157 lb 10.1 oz  Weight (kg) 72.394 kg 71.94 kg 71.5 kg      Telemetry    NSR, HR 80s, no arrhythmias noted - Personally Reviewed  ECG    NSR, 87 bpm, ST depressions/T wave inversion infra lateral leads  - Personally Reviewed  Physical Exam   GEN: No acute distress.   Neck: No JVD Cardiac: RRR, no murmurs, rubs, or gallops.  Respiratory: Clear to auscultation bilaterally. GI: Soft, nontender, non-distended  MS: No edema; No deformity. Neuro:  Nonfocal  Psych: Normal affect   Labs    High Sensitivity Troponin:   Recent Labs  Lab 01/07/19 1850 01/07/19 2230 01/08/19 0220 01/08/19 0534  TROPONINIHS 195* 222* 220* 241*      Cardiac EnzymesNo results for input(s): TROPONINI in the last 168 hours. No results for input(s): TROPIPOC in the last 168 hours.   Chemistry Recent Labs  Lab 01/07/19 1850 01/08/19 0220 01/10/19 0453  NA 139 137 136  K 4.4 4.9 4.1  CL 102 101 100  CO2 24 22 25   GLUCOSE 133* 182* 146*  BUN 40* 41* 37*  CREATININE 4.63* 4.74* 4.28*  CALCIUM 9.0 8.7* 8.7*  GFRNONAA 8* 8* 9*  GFRAA 9* 9* 10*  ANIONGAP 13 14 11      Hematology Recent Labs  Lab 01/08/19 1902 01/09/19 0216 01/10/19 0453  WBC 9.3 8.8 6.3  RBC 4.63 4.72 4.31  HGB 13.5 13.7 12.4  HCT 41.8 43.2 38.6  MCV 90.3 91.5 89.6  MCH 29.2 29.0 28.8  MCHC 32.3 31.7 32.1  RDW 18.8* 19.2* 18.6*  PLT 213 205 196    BNP Recent Labs  Lab 01/07/19 1850  BNP 3,534.3*     DDimer No results for input(s): DDIMER in the last 168 hours.   Radiology    No results found.  Cardiac Studies   Echo 01/08/2019  1. The left ventricle has moderate-severely reduced systolic function, with an ejection fraction of 30-35%. The cavity size was normal. There is moderate concentric left ventricular hypertrophy. Left ventricular diastolic Doppler parameters are  consistent with pseudonormalization. Elevated left atrial and left ventricular end-diastolic pressures.  2. The right ventricle has moderately reduced systolic function. The cavity was normal. There is no increase in right ventricular wall thickness. Right ventricular systolic pressure is normal.  3. Left atrial size was moderately dilated.  4. Right atrial size was  mildly dilated.  5. The mitral valve is degenerative. Moderate thickening of the mitral valve leaflet. There is mild mitral annular calcification present. Mitral valve regurgitation is moderate by color flow Doppler.  6. The aortic valve is tricuspid. Mild thickening of the aortic valve. Aortic valve regurgitation is mild by color flow Doppler.  Patient Profile     85 y.o. female with hx of coronary artery disease status post coronary artery bypass and graft in 2017, Left heart cath with NSTEMI 06/2018 and stent to VG to OM, ESRD on HD, hypertension, diabetes mellitus, chronic diastolic heart failure for evaluation of chest pain.   Echocardiogram shows newly reduced LV function with ejection fraction 30 to 35%, moderate left ventricular hypertrophy, biatrial enlargement and moderate mitral regurgitation.  There is mild aortic insufficiency. Patient scheduled for Left hear Cath today.  Assessment & Plan    1.Chest pain/Elevated Troponin - Possibly in the setting of ESRD on HD -HS troponin 7/4 220>>240 -EKG shows NSR with inferolateral ST depression/T wave inversion  - Left heart cath scheduled for today to rule out ischemia -on IV heparin -No active Chest pain  2. Chronic Diastolic Heart Faiure -Echo with newly reduced LVEF 30-35% -BNP elevated in ER 3534 -patient denies sob, euvolemic on exam -Begin Bidil  3. ESRD on [facility] -per nephrology -HD T, Th, Sat  4. HTN - still slightly elevated -metoprolol succinate 25 mg once daily -continue losartan 25 mg once daily -start patient on Bidil for better control -continue to monitor  5. Hyperlipidemia -Lipitor 80 mg, home med  For questions or updates, please contact [place] Please consult [email or web address] for contact info under        Signed, Chistopher Mangino Ninfa Meeker, PA-C  01/10/2019, 9:26 AM

## 2019-01-10 NOTE — Plan of Care (Signed)
  Problem: Education: Goal: Knowledge of General Education information will improve Description: Including pain rating scale, medication(s)/side effects and non-pharmacologic comfort measures Outcome: Progressing   Problem: Clinical Measurements: Goal: Ability to maintain clinical measurements within normal limits will improve Outcome: Progressing Goal: Will remain free from infection Outcome: Progressing Goal: Respiratory complications will improve Outcome: Progressing Goal: Cardiovascular complication will be avoided Outcome: Progressing   Problem: Nutrition: Goal: Adequate nutrition will be maintained Outcome: Progressing   Problem: Coping: Goal: Level of anxiety will decrease Outcome: Progressing   Problem: Elimination: Goal: Will not experience complications related to bowel motility Outcome: Progressing Goal: Will not experience complications related to urinary retention Outcome: Progressing   Problem: Pain Managment: Goal: General experience of comfort will improve Outcome: Progressing

## 2019-01-10 NOTE — Progress Notes (Signed)
    6 Fr R F/A sheath was pulled, and manual pressure was held for 20 min.R groin area is soft and non tender. Sterile gauze was applied at the procedural site. R DP was dopplerable before and after the sheath pull. Bed rest started at 1900 X 4 hr. Pt was instructed on her bed resr restrictions.  Hr 83 SR with BBB Bp 164/54 Spo2 100% on R/A

## 2019-01-10 NOTE — Progress Notes (Signed)
PROGRESS NOTE    Monica [name]  PFX:[id number] DOB: May 24, 1931 DOA: 01/07/2019 PCP: Benito Mccreedy, MD    Brief Narrative:  85 year old female with a history of previous MI, coronary artery bypass grafting 2 years ago, end-stage renal disease on dialysis awoke this morning with chest pressure that was not typical for her normal chest pain..  She took a nitroglycerin continued to have chest pressure through the morning.  She did have some diaphoresis.  She presented to the emergency room at [facility] with elevated troponins and clinical course consistent with non-STEMI.  She was started on heparin and nitroglycerin drip with relief of her chest pain improvement in her dyspnea and transferred to [facility] for further evaluation.  On evaluation here she says her chest pain is been relieved.  At [facility] she was on 15 L nasal cannula but was easily titrated to 6 L nasal cannula here.  On my evaluation she is awake alert able to converse in full sentences.  He does not appear dyspneic currently.  She tells me she does take a nitroglycerin about once every 3 to 4 months with complete pain relief.  Troponins were elevated at [facility] as high as 222.  BMP was remarkably normal save anion gap of 13 creatinine of 4.63 and BUN of 40.  BNP was 3534.  X-ray report from [facility] shows small bilateral effusions  Assessment & Plan:   Active Problems:   Chest pain   Acute respiratory failure (HCC)   NSTEMI (non-ST elevated myocardial infarction) ([place])   Acute systolic congestive heart failure ([place])  1.  Non-STEMI:  -No chest pain presently -Cardiology following. Pt with new EF of 30-35% with recommendation for follow up heart cath -Pt now s/p heart cath 7/6, with stent placement noted 2.  End-stage renal disease on dialysis.   -Nephrology following -Pt on TTS HD -Pt planned for next HD session 7/7 3.  Hypertension:  -Suboptimally controlled -Currently  on metoprolol with ARB started per Cardiology -Continue PRN hydralazine -Cont to titrate BP med as needed 4.  Hypoxemia -following HD, presently on room air -Resolved 4.  Bilateral effusions - stable cardiomegaly noted on most recent CXR, reviewed - Clinically stable at this time  DVT prophylaxis: Therapeutic Lovenox Code Status: Full Family Communication: pt in room, family at bedside Disposition Plan: Possible home in 24hrs  Consultants:   Cardiology  PCCM  Nephrology  Procedures:   Heart cath 7/6  Antimicrobials: Anti-infectives (From admission, onward)   None      Subjective: This AM, was feeling anxious about cath  Objective: Vitals:   01/10/19 1558 01/10/19 1602 01/10/19 1607 01/10/19 1621  BP: (!) 201/78 (!) 158/66 (!) 156/56 (!) 154/35  Pulse: 80 93 80 86  Resp: 15 19 20 16   Temp:      TempSrc:      SpO2: 100% 100% 100%   Weight:      Height:        Intake/Output Summary (Last 24 hours) at 01/10/2019 1730 Last data filed at 01/10/2019 1658 Gross per 24 hour  Intake 34.2 ml  Output -  Net 34.2 ml   Filed Weights   01/08/19 1813 01/09/19 0000 01/10/19 0438  Weight: 71.5 kg 71.9 kg 72.4 kg    Examination: General exam: Awake, laying in bed, in nad Respiratory system: Normal respiratory effort, no wheezing Cardiovascular system: regular rate, s1, s2 Gastrointestinal system: Soft, nondistended, positive BS Central nervous system:  CN2-12 grossly intact, strength intact Extremities: Perfused, no clubbing Skin: Normal skin turgor, no notable skin lesions seen Psychiatry: Mood normal // no visual hallucinations   Data Reviewed: I have personally reviewed following labs and imaging studies  CBC: Recent Labs  Lab 01/07/19 1850 01/08/19 0220 01/08/19 1902 01/09/19 0216 01/10/19 0453  WBC 6.9 7.6 9.3 8.8 6.3  HGB 13.2 12.9 13.5 13.7 12.4  HCT 42.2 41.5 41.8 43.2 38.6  MCV 93.2 92.8 90.3 91.5 89.6  PLT 193 202 213 205 161   Basic Metabolic  Panel: Recent Labs  Lab 01/07/19 1850 01/08/19 0220 01/10/19 0453  NA 139 137 136  K 4.4 4.9 4.1  CL 102 101 100  CO2 24 22 25   GLUCOSE 133* 182* 146*  BUN 40* 41* 37*  CREATININE 4.63* 4.74* 4.28*  CALCIUM 9.0 8.7* 8.7*   GFR: Estimated Creatinine Clearance: 8.8 mL/min (A) (by C-G formula based on SCr of 4.28 mg/dL (H)). Liver Function Tests: No results for input(s): AST, ALT, ALKPHOS, BILITOT, PROT, ALBUMIN in the last 168 hours. No results for input(s): LIPASE, AMYLASE in the last 168 hours. No results for input(s): AMMONIA in the last 168 hours. Coagulation Profile: No results for input(s): INR, PROTIME in the last 168 hours. Cardiac Enzymes: No results for input(s): CKTOTAL, CKMB, CKMBINDEX, TROPONINI in the last 168 hours. BNP (last 3 results) No results for input(s): PROBNP in the last 8760 hours. HbA1C: Recent Labs    01/08/19 0220  HGBA1C 7.0*   CBG: Recent Labs  Lab 01/09/19 1155 01/09/19 1653 01/09/19 2129 01/10/19 0636 01/10/19 1205  GLUCAP 120* 132* 213* 131* 102*   Lipid Profile: No results for input(s): CHOL, HDL, LDLCALC, TRIG, CHOLHDL, LDLDIRECT in the last 72 hours. Thyroid Function Tests: No results for input(s): TSH, T4TOTAL, FREET4, T3FREE, THYROIDAB in the last 72 hours. Anemia Panel: No results for input(s): VITAMINB12, FOLATE, FERRITIN, TIBC, IRON, RETICCTPCT in the last 72 hours. Sepsis Labs: No results for input(s): PROCALCITON, LATICACIDVEN in the last 168 hours.  Recent Results (from the past 240 hour(s))  SARS Coronavirus 2 (Hosp order,Performed in [facility] lab via [employer] ID)     Status: None   Collection Time: 01/07/19  8:15 PM   Specimen: Dry Nasal Swab ([employer] ID Now)  Result Value Ref Range Status   SARS Coronavirus 2 ([employer] ID Now) NEGATIVE NEGATIVE Final    Comment: (NOTE) SARS-CoV-2 target nucleic acids are NOT DETECTED. The SARS-CoV-2 RNA is generally detectable in upper and lower respiratory specimens during the  acute phase of infection.  Negativeresults do not preclude SARS-CoV-2 infection, do not rule out coinfections with other pathogens, and should not be used as the  sole basis for treatment or other patient management decisions.  Negative results must be combined with clinical observations, patient history, and epidemiological information. The expected result is Negative. Fact Sheet for Patients: GolfingFamily.no Fact Sheet for Healthcare Providers: https://www.hernandez-brewer.com/ This test is not yet approved or cleared by the [place] FDA and  has been authorized for detection and/or diagnosis of SARS-CoV-2 by FDA under an Emergency Use Authorization (EUA).  This EUA will remain in effect (meaning this test can be used) for the duration of  the COVID19 declaration under Section 5 64(b)(1) of the Act, 21 U.S.C.  section [phone] 3(b)(1), unless the authorization is terminated or revoked sooner. Performed at [facility], [address] [place], [place] [address]   MRSA PCR Screening     Status: None  Collection Time: 01/08/19  2:56 AM   Specimen: Nasal Mucosa; Nasopharyngeal  Result Value Ref Range Status   MRSA by PCR NEGATIVE NEGATIVE Final    Comment:        The GeneXpert MRSA Assay (FDA approved for NASAL specimens only), is one component of a comprehensive MRSA colonization surveillance program. It is not intended to diagnose MRSA infection nor to guide or monitor treatment for MRSA infections. Performed at [facility], [place] [address] [place], [place] [address]      Radiology Studies: No results found.  Scheduled Meds: . [MAR Hold] aspirin  324 mg Oral Once  . [MAR Hold] aspirin EC  81 mg Oral Daily  . [MAR Hold] atorvastatin  80 mg Oral Daily  . [MAR Hold] Chlorhexidine Gluconate Cloth  6 each Topical Q0600  . [START ON 01/11/2019] Chlorhexidine Gluconate Cloth  6 each Topical Q0600  . [MAR Hold]  clopidogrel  75 mg Oral Daily  . [MAR Hold] enoxaparin (LOVENOX) injection  70 mg Subcutaneous Q24H  . [MAR Hold] ferric citrate  420 mg Oral TID WC  . [MAR Hold] insulin aspart  0-9 Units Subcutaneous TID WC  . [MAR Hold] isosorbide-hydrALAZINE  1 tablet Oral BID  . [MAR Hold] latanoprost  1 drop Both Eyes QHS  . [MAR Hold] losartan  25 mg Oral Daily  . [MAR Hold] mouth rinse  15 mL Mouth Rinse BID  . [MAR Hold] metoprolol succinate  25 mg Oral Daily  . [MAR Hold] sodium chloride flush  3 mL Intravenous Once  . [MAR Hold] sodium chloride flush  3 mL Intravenous Q12H   Continuous Infusions: . sodium chloride 10 mL/hr at 01/08/19 1100  . sodium chloride    . sodium chloride 10 mL/hr at 01/10/19 1206  . sodium chloride       LOS: 3 days   Marylu Lund, MD [facility] Pager On Amion  If 7PM-7AM, please contact night-coverage 01/10/2019, 5:30 PM

## 2019-01-10 NOTE — Progress Notes (Signed)
[facility] ROUNDING NOTE   Subjective:   This is a very pleasant 85 year old lady with history of myocardial infarction coronary artery bypass grafting in 2017.  End-stage renal disease Tuesday Thursday Saturday [place] dialysis.  She presented to the emergency room 01/07/2019 with non-STEMI..  She has been evaluated by Dr. Stanford Breed.  2D echo showed decreased ejection fraction 30 to 35%.  She underwent successful dialysis with ultrafiltration of 2 L 01/08/2019  Blood pressure  [id number] pulse 89 temperature 98.2 O2 sats 100% room air  Seen in pt's room, no c/o today, awaiting LHC this afternoon  IV heparin IV nitroglycerin  Objective:  Vital signs in last 24 hours:  Temp:  [98.1 F (36.7 C)-98.4 F (36.9 C)] 98.1 F (36.7 C) (07/06 1203) Pulse Rate:  [82-88] 85 (07/06 1305) Resp:  [17-19] 17 (07/06 1203) BP: (127-182)/(57-89) 127/57 (07/06 1305) SpO2:  [98 %-100 %] 100 % (07/06 1423) Weight:  [72.4 kg] 72.4 kg (07/06 0438)  Weight change: -1.406 kg Filed Weights   01/08/19 1813 01/09/19 0000 01/10/19 0438  Weight: 71.5 kg 71.9 kg 72.4 kg    Intake/Output: I/O last 3 completed shifts: In: 887.9 [P.O.:480; I.V.:407.9] Out: -    Intake/Output this shift:  Total I/O In: 15 [P.O.:15] Out: -   Exam: CVS- RRR systolic murmur JVP not elevated RS- CTA no wheezes no rales ABD- BS present soft non-distended EXT-trace pitting edema lower extremities R chest TDC, LUA AVF +bruit (says can use next week)  OP HD:  [facility] [place] [facility]  162lb (= 73.5kg)  3.5h  350/700  3K/2.5 bath  Hep none    Assessment/ Plan:   Chest pain/ NSTEMI / hx CABG 2017: last cath catheterization 06/2018 with stent to VG to OM.  She continues on aspirin and Plavix. She is going for cath today.   ESRD- TTS dialysis [place] underwent successful dialysis 01/08/2019 with ultrafiltration of over 2 L. Next HD tomorrow.   ANEMIA- not an issue at this time  MBD- continue meds  HTN/VOL-  appears to be stable on exam, 1kg under dry  ACCESS- right hemodialysis cath the left fistula that is not being used  Congestive heart failure with new systolic dysfunction  Hyperlipidemia continues on Lipitor 80 mg daily  Diabetes mellitus per primary team  History of peptic ulcer and GI bleed with stable hemoglobin  Kelly Splinter, MD 01/10/2019, 3:35 PM    Basic Metabolic Panel: Recent Labs  Lab 01/07/19 1850 01/08/19 0220 01/10/19 0453  NA 139 137 136  K 4.4 4.9 4.1  CL 102 101 100  CO2 24 22 25   GLUCOSE 133* 182* 146*  BUN 40* 41* 37*  CREATININE 4.63* 4.74* 4.28*  CALCIUM 9.0 8.7* 8.7*    Liver Function Tests: No results for input(s): AST, ALT, ALKPHOS, BILITOT, PROT, ALBUMIN in the last 168 hours. No results for input(s): LIPASE, AMYLASE in the last 168 hours. No results for input(s): AMMONIA in the last 168 hours.  CBC: Recent Labs  Lab 01/07/19 1850 01/08/19 0220 01/08/19 1902 01/09/19 0216 01/10/19 0453  WBC 6.9 7.6 9.3 8.8 6.3  HGB 13.2 12.9 13.5 13.7 12.4  HCT 42.2 41.5 41.8 43.2 38.6  MCV 93.2 92.8 90.3 91.5 89.6  PLT 193 202 213 205 196     Medications:   . sodium chloride 10 mL/hr at 01/08/19 1100  . sodium chloride    . sodium chloride 10 mL/hr at 01/10/19 1205   . [MAR Hold] aspirin  324 mg Oral Once  . [MAR Hold] aspirin EC  81 mg Oral Daily  . [MAR Hold] atorvastatin  80 mg Oral Daily  . [MAR Hold] Chlorhexidine Gluconate Cloth  6 each Topical Q0600  . [MAR Hold] clopidogrel  75 mg Oral Daily  . [MAR Hold] enoxaparin (LOVENOX) injection  70 mg Subcutaneous Q24H  . [MAR Hold] ferric citrate  420 mg Oral TID WC  . [MAR Hold] insulin aspart  0-9 Units Subcutaneous TID WC  . [MAR Hold] isosorbide-hydrALAZINE  1 tablet Oral BID  . [MAR Hold] latanoprost  1 drop Both Eyes QHS  . [MAR Hold] losartan  25 mg Oral Daily  . [MAR Hold] mouth rinse  15 mL Mouth Rinse BID  . [MAR Hold] metoprolol succinate  25 mg Oral Daily  . [MAR Hold] sodium  chloride flush  3 mL Intravenous Once  . [MAR Hold] sodium chloride flush  3 mL Intravenous Q12H   sodium chloride, [MAR Hold] acetaminophen, [MAR Hold] albuterol, [MAR Hold] alum & mag hydroxide-simeth, fentaNYL, heparin, [MAR Hold] hydrALAZINE, labetalol, lidocaine (PF), midazolam, sodium chloride flush

## 2019-01-10 NOTE — Care Management Important Message (Signed)
Important Message  Patient Details  Name: Monica [name] MRN: [id number] Date of Birth: [birth year]   Medicare Important Message Given:  Yes     [name] 01/10/2019, 12:47 PM

## 2019-01-10 NOTE — Progress Notes (Signed)
Pt going for cardiac cath tomorrow refused PIV insertion at this time, wants it done in the morning.

## 2019-01-11 ENCOUNTER — Encounter (HOSPITAL_COMMUNITY): Payer: Self-pay | Admitting: Cardiology

## 2019-01-11 DIAGNOSIS — I5041 Acute combined systolic (congestive) and diastolic (congestive) heart failure: Secondary | ICD-10-CM

## 2019-01-11 DIAGNOSIS — Z955 Presence of coronary angioplasty implant and graft: Secondary | ICD-10-CM

## 2019-01-11 LAB — BASIC METABOLIC PANEL
Anion gap: 14 (ref 5–15)
BUN: 48 mg/dL — ABNORMAL HIGH (ref 8–23)
CO2: 22 mmol/L (ref 22–32)
Calcium: 8.4 mg/dL — ABNORMAL LOW (ref 8.9–10.3)
Chloride: 98 mmol/L (ref 98–111)
Creatinine, Ser: 5.11 mg/dL — ABNORMAL HIGH (ref 0.44–1.00)
GFR calc Af Amer: 8 mL/min — ABNORMAL LOW (ref >60)
GFR calc non Af Amer: 7 mL/min — ABNORMAL LOW (ref >60)
Glucose, Bld: 176 mg/dL — ABNORMAL HIGH (ref 70–99)
Potassium: 4.4 mmol/L (ref 3.5–5.1)
Sodium: 134 mmol/L — ABNORMAL LOW (ref 135–145)

## 2019-01-11 LAB — CBC
HCT: 36.8 % (ref 36.0–46.0)
Hemoglobin: 12.1 g/dL (ref 12.0–15.0)
MCH: 29.2 pg (ref 26.0–34.0)
MCHC: 32.9 g/dL (ref 30.0–36.0)
MCV: 88.7 fL (ref 80.0–100.0)
Platelets: 180 10*3/uL (ref 150–400)
RBC: 4.15 MIL/uL (ref 3.87–5.11)
RDW: 18.9 % — ABNORMAL HIGH (ref 11.5–15.5)
WBC: 5.5 10*3/uL (ref 4.0–10.5)
nRBC: 0 % (ref 0.0–0.2)

## 2019-01-11 LAB — GLUCOSE, CAPILLARY
Glucose-Capillary: 141 mg/dL — ABNORMAL HIGH (ref 70–99)
Glucose-Capillary: 99 mg/dL (ref 70–99)

## 2019-01-11 LAB — HEPATITIS B CORE ANTIBODY, TOTAL: Hep B Core Total Ab: NEGATIVE

## 2019-01-11 LAB — HEPATITIS B SURFACE ANTIBODY,QUALITATIVE: Hep B S Ab: NONREACTIVE

## 2019-01-11 LAB — HEPATITIS B SURFACE ANTIGEN: Hepatitis B Surface Ag: NEGATIVE

## 2019-01-11 MED ORDER — HEPARIN SODIUM (PORCINE) 1000 UNIT/ML IJ SOLN
INTRAMUSCULAR | Status: AC
  Administered 2019-01-11: 1000 [IU]
  Filled 2019-01-11: qty 4

## 2019-01-11 MED ORDER — HYDRALAZINE HCL 25 MG PO TABS: 25.0000 mg | ORAL_TABLET | Freq: Three times a day (TID) | ORAL | 0 refills | Status: DC

## 2019-01-11 MED ORDER — METOPROLOL SUCCINATE ER 25 MG PO TB24: 25.0000 mg | ORAL_TABLET | Freq: Every day | ORAL | 0 refills | Status: DC

## 2019-01-11 MED ORDER — ISOSORB DINITRATE-HYDRALAZINE 20-37.5 MG PO TABS: 1.0000 | ORAL_TABLET | Freq: Two times a day (BID) | ORAL | 0 refills | Status: DC

## 2019-01-11 MED ORDER — LOSARTAN POTASSIUM 25 MG PO TABS: 25.0000 mg | ORAL_TABLET | Freq: Every day | ORAL | 0 refills | Status: DC

## 2019-01-11 MED FILL — hydrALAZINE HCL 25 MG TABS: 25 | 30 days supply | Qty: 90 | Fill #0

## 2019-01-11 MED FILL — LOSARTAN POTASSIUM 25 MG TA: 25 | 30 days supply | Qty: 30 | Fill #0

## 2019-01-11 MED FILL — METOPROLOL SUCCINATE ER 25: 25 | 30 days supply | Qty: 30 | Fill #0

## 2019-01-11 NOTE — Progress Notes (Signed)
Progress Note  Patient Name: Monica [name] Date of Encounter: 01/11/2019  Primary Cardiologist: Ena Dawley, MD   Subjective   Patient seen during dialysis treatment. State she is feeling better today. She denies chest pain or sob. She is able to walk without symptoms.   Inpatient Medications    Scheduled Meds: . aspirin  324 mg Oral Once  . aspirin EC  81 mg Oral Daily  . atorvastatin  80 mg Oral Daily  . Chlorhexidine Gluconate Cloth  6 each Topical Q0600  . Chlorhexidine Gluconate Cloth  6 each Topical Q0600  . clopidogrel  75 mg Oral Daily  . enoxaparin (LOVENOX) injection  30 mg Subcutaneous Q24H  . ferric citrate  420 mg Oral TID WC  . heparin      . insulin aspart  0-9 Units Subcutaneous TID WC  . isosorbide-hydrALAZINE  1 tablet Oral BID  . latanoprost  1 drop Both Eyes QHS  . losartan  25 mg Oral Daily  . mouth rinse  15 mL Mouth Rinse BID  . metoprolol succinate  25 mg Oral Daily  . sodium chloride flush  3 mL Intravenous Once  . sodium chloride flush  3 mL Intravenous Q12H  . sodium chloride flush  3 mL Intravenous Q12H   Continuous Infusions: . sodium chloride 10 mL/hr at 01/08/19 1100  . sodium chloride     PRN Meds: sodium chloride, acetaminophen, albuterol, alum & mag hydroxide-simeth, hydrALAZINE, ondansetron (ZOFRAN) IV, sodium chloride flush   Vital Signs    Vitals:   01/11/19 0740 01/11/19 0745 01/11/19 0750 01/11/19 0815  BP: (!) 126/50 (!) 123/55 (!) 121/57 128/63  Pulse: 81 82 78 84  Resp: 11 10 11 15   Temp: 98.8 F (37.1 C) 98.8 F (37.1 C)    TempSrc: Oral Oral    SpO2: 96% 97%    Weight: 74.1 kg     Height:        Intake/Output Summary (Last 24 hours) at 01/11/2019 1019 Last data filed at 01/11/2019 0300 Gross per 24 hour  Intake 19.2 ml  Output 200 ml  Net -180.8 ml   Last 3 Weights 01/11/2019 01/11/2019 01/10/2019  Weight (lbs) 163 lb 5.8 oz 160 lb 15 oz 164 lb 14.5 oz  Weight (kg) 74.1 kg 73 kg 74.8 kg      Telemetry    NSR,  HR 90s, no arrhythmias noted - Personally Reviewed  ECG    No new - Personally Reviewed  Physical Exam   GEN: No acute distress.   Neck: No JVD Cardiac: RRR, no murmurs, rubs, or gallops.  Respiratory: Clear to auscultation bilaterally. GI: Soft, nontender, non-distended  MS: No edema; No deformity. Right groin clean with no hematoma Neuro:  Nonfocal  Psych: Normal affect   Labs    High Sensitivity Troponin:   Recent Labs  Lab 01/07/19 1850 01/07/19 2230 01/08/19 0220 01/08/19 0534  TROPONINIHS 195* 222* 220* 241*      Cardiac EnzymesNo results for input(s): TROPONINI in the last 168 hours. No results for input(s): TROPIPOC in the last 168 hours.   Chemistry Recent Labs  Lab 01/08/19 0220 01/10/19 0453 01/11/19 0234  NA 137 136 134*  K 4.9 4.1 4.4  CL 101 100 98  CO2 22 25 22   GLUCOSE 182* 146* 176*  BUN 41* 37* 48*  CREATININE 4.74* 4.28* 5.11*  CALCIUM 8.7* 8.7* 8.4*  GFRNONAA 8* 9* 7*  GFRAA 9* 10* 8*  ANIONGAP 14 11 14  Hematology Recent Labs  Lab 01/09/19 0216 01/10/19 0453 01/11/19 0234  WBC 8.8 6.3 5.5  RBC 4.72 4.31 4.15  HGB 13.7 12.4 12.1  HCT 43.2 38.6 36.8  MCV 91.5 89.6 88.7  MCH 29.0 28.8 29.2  MCHC 31.7 32.1 32.9  RDW 19.2* 18.6* 18.9*  PLT 205 196 180    BNP Recent Labs  Lab 01/07/19 1850  BNP 3,534.3*     DDimer No results for input(s): DDIMER in the last 168 hours.   Radiology    No results found.  Cardiac Studies   Left Cardiac Cath 11/07/2990  LV end diastolic pressure is normal.  Dist LM to Prox LAD lesion is 30% stenosed.  Mid LAD-1 lesion is 95% stenosed. Mid LAD-2 lesion is 100% stenosed.  Ramus lesion is 100% stenosed.  Ost Cx to Mid Cx lesion is 100% stenosed.  Prox RCA lesion is 70% stenosed. Prox RCA to Mid RCA lesion is 90% stenosed. Mid RCA lesion is 95% stenosed. Dist RCA lesion is 100% stenosed with 100% stenosed side branch in RPAV.  -------------GRAFTS--------------  LIMA-LAD graft was  visualized by angiography and is normal in caliber. There is no competitive flow  SVG-dRCA graft was visualized by angiography. The graft exhibits severe diffuse disease. Origin to Prox Graft lesion is 90% stenosed. Prox Graft to Mid Graft lesion is 80% stenosed follwed by Dist Graft lesion is 100% stenosed.  -----------INTERVENTION----------------  SVG-OM graft was visualized by angiography.  Mid Graft lesion is 75% stenosed.  A drug-eluting stent was successfully placed using a STENT SYNERGY DES 2.5X12. Postdilated 2.8 mm  ---------------------------  Mid Graft to Dist Graft Stent is 85% stenosed. Balloon angioplasty was performed using a BALLOON [employer] [place].  Post intervention, there is a 0% residual stenosis -in both the new stent, stent overlap in the previous stent.  SVG-RI graft was visualized by angiography. Origin to Prox Graft lesion is 85% stenosed.  A drug-eluting stent was successfully placed using a STENT SYNERGY DES 2.5X20. -Postdilated to 2.7 mm  Post intervention, there is a 0% residual stenosis.   SUMMARY  Severe native vessel CAD: 100% ostial Circumflex (LCx) and ostial Ramus Intermedius, diffuse calcified proximal LAD followed by 95% stenosis just after 1st Diag followed by 100% -all CTO; severe diffuse proximal to mid RCA disease with CTO of the distal RCA  Essentially occluded/atretic SVG-dRCA  Severe proximal to stent and in-stent restenosis in SVG-OM ? Successful PTCA followed by DES PCI with overlapping DES stent (Synergy 2.5 mm x 12 mm - 2.8 mm)  Severe ostial-proximal SVG-RI ? Successful DES PCI (Synergy 2.5 mm x 20 mm--2.7 mm)  Normal LVEDP despite significant systemic hypertension   Echo 01/08/2019 1. The left ventricle has moderate-severely reduced systolic function, with an ejection fraction of 30-35%. The cavity size was normal. There is moderate concentric left ventricular hypertrophy. Left ventricular diastolic Doppler parameters are   consistent with pseudonormalization. Elevated left atrial and left ventricular end-diastolic pressures. 2. The right ventricle has moderately reduced systolic function. The cavity was normal. There is no increase in right ventricular wall thickness. Right ventricular systolic pressure is normal. 3. Left atrial size was moderately dilated. 4. Right atrial size was mildly dilated. 5. The mitral valve is degenerative. Moderate thickening of the mitral valve leaflet. There is mild mitral annular calcification present. Mitral valve regurgitation is moderate by color flow Doppler. 6. The aortic valve is tricuspid. Mild thickening of the aortic valve. Aortic valve regurgitation is mild by color flow Doppler.  Patient Profile     85 y.o. female coronary artery disease status post coronary artery bypass and graft in 2017, Left heart cath with NSTEMI 06/2018 and stent to VG to OM, ESRD on HD, hypertension, diabetes mellitus, chronic diastolic heart failure for evaluation of chest pain.   Assessment & Plan    1. Chest pain/Elevated Troponin -Left Heart Cath with stent x 2 yesterday- Severe native vessel CAD, occluded/atretic SVG-dRCA, Severe proximal to stent and in-stent restenosis in SVG-OM with successful PTCA followed by DES PCI with overlapping DES stent, Severe ostial-proximal SVG-RI, Successful DES PCI, Normal LVEDP  - Access site-right groin clean with no hematoma -HMG stable 12.1 -Denies Chest pain/SOB. Able to ambulate -Continue ASA and Plavix x 1 year. Continue BB, statin, ARB -Follow up in office post-discharge  2. Combined Systolic and Diastolic Heart Failure - Echo with newly reduced LVEF 30-35% -BNP elevated in ER 3534 -patient denies sob, euvolemic on exam - continue Bidil, metoprolol, Losartan -Volume control through dialysis  3. ESRD on HD -HD T.Th, Sat  4. HTN -stable, today 128/63, HR 84 -Continue current meds  5. Hyperlipidemia -LDL at goal <70, 53 -continue  atorvastatin    For questions or updates, please contact [place] Please consult [email or web address] for contact info under        Signed, Hiroyuki Ozanich Ninfa Meeker, PA-C  01/11/2019, 10:19 AM

## 2019-01-11 NOTE — Progress Notes (Signed)
CARDIAC REHAB PHASE I   Pt recently back from HD, c/o "not feeling well". Pt educated on importance of ASA, Plavix, and NTG. Pt given MI book and stent card. Pt on dialysis, deferred diet to dialysis center. Reviewed restrictions and site care. Encouraged ambulation as able. Will refer to CRP II [place].   [id number] Rufina Falco, RN BSN 01/11/2019 1:41 PM

## 2019-01-11 NOTE — Discharge Summary (Signed)
Physician Discharge Summary  Monica [name] JYN:[id number] DOB: 1931/06/06 DOA: 01/07/2019  PCP: Benito Mccreedy, MD  Admit date: 01/07/2019 Discharge date: 01/11/2019  Admitted From: Home Disposition:  Home  Recommendations for Outpatient Follow-up:  1. Follow up with PCP in 1-2 weeks 2. Follow up with Cardiology as scheduled 3. Cardiology recommendation for DAPT with ASA and plavix x 1 year  Discharge Condition:Stable CODE STATUS:Full Diet recommendation: Diabetic, heart healthy   Brief/Interim Summary: 85 year old female with a history of previous MI, coronary artery bypass grafting 2 years ago, end-stage renal disease on dialysis awoke this morning with chest pressure that was not typical for her normal chest pain.. She took a nitroglycerin continued to have chest pressure through the morning. She did have some diaphoresis. She presented to the emergency room at [facility] with elevated troponins and clinical course consistent with non-STEMI. She was started on heparin and nitroglycerin drip with relief of her chest pain improvement in her dyspnea and transferred to [facility] for further evaluation. On evaluation here she says her chest pain is been relieved. At [facility] she was on 15 L nasal cannula but was easily titrated to 6 L nasal cannula here. On my evaluation she is awake alert able to converse in full sentences. He does not appear dyspneic currently. She tells me she does take a nitroglycerin about once every 3 to 4 months with complete pain relief. Troponins were elevated at [facility] as high as 222. BMP was remarkably normal save anion gap of 13 creatinine of 4.63 and BUN of 40. BNP was 3534.X-ray report from [facility] shows small bilateral effusions   Discharge Diagnoses:  Active Problems:   Chest pain   SOB (shortness of breath)   Acute respiratory failure (HCC)   NSTEMI (non-ST elevated myocardial infarction)  (HCC)   Acute combined systolic and diastolic heart failure (HCC)   S/P coronary artery stent placement  1. Non-STEMI: -Had been without chest pains -Cardiology following. Pt with new EF of 30-35% with recommendation for follow up heart cath -Pt now s/p heart cath 7/6, with stent placement noted -Cardiology recommends 1 year DAPT with ASA and plavix 2. End-stage renal disease on dialysis.  -Nephrology following -Pt on TTS HD -Pt planned for next HD session 7/7 3. Hypertension:  -Suboptimally controlled -Currently on metoprolol with ARB started per Cardiology -Cont to titrate BP med as needed 4. Hypoxemia -following HD, presently on room air -Resolved 4. Bilateral effusions - stable cardiomegaly noted on most recent CXR, reviewed - Clinically stable at this time   Discharge Instructions  Discharge Instructions    AMB Referral to Cardiac Rehabilitation - Phase II   Complete by: As directed    Diagnosis:  Coronary Stents Heart Failure (see criteria below if ordering Phase II)     Heart Failure Type: Chronic Systolic & Diastolic   After initial evaluation and assessments completed: Virtual Based Care may be provided alone or in conjunction with Phase 2 Cardiac Rehab based on patient barriers.: Yes     Allergies as of 01/11/2019   No Known Allergies     Medication List    STOP taking these medications   isosorbide mononitrate 30 MG 24 hr tablet Commonly known as: IMDUR   meloxicam 7.5 MG tablet Commonly known as: MOBIC   metoprolol tartrate 25 MG tablet Commonly known as: LOPRESSOR     TAKE these medications   acetaminophen 500 MG tablet Commonly known as: TYLENOL Take  1,000 mg by mouth every 6 (six) hours as needed for mild pain or headache.   albuterol 108 (90 Base) MCG/ACT inhaler Commonly known as: VENTOLIN HFA Inhale 2 puffs into the lungs every 6 (six) hours as needed for wheezing or shortness of breath.   allopurinol 100 MG tablet Commonly known  as: ZYLOPRIM Take 100 mg by mouth 2 (two) times daily.   aspirin 81 MG EC tablet Take 1 tablet (81 mg total) by mouth daily.   atorvastatin 80 MG tablet Commonly known as: LIPITOR Take 80 mg by mouth daily.   clopidogrel 75 MG tablet Commonly known as: PLAVIX Take 75 mg by mouth daily.   diphenhydrAMINE 25 MG tablet Commonly known as: BENADRYL Take 25 mg by mouth as needed for itching or allergies.   ferric citrate 1 GM 210 MG(Fe) tablet Commonly known as: AURYXIA Take 210 mg by mouth 3 (three) times daily with meals.   insulin glargine 100 UNIT/ML injection Commonly known as: LANTUS Inject 10 Units into the skin at bedtime.   isosorbide-hydrALAZINE 20-37.5 MG tablet Commonly known as: BIDIL Take 1 tablet by mouth 2 (two) times a day.   latanoprost 0.005 % ophthalmic solution Commonly known as: XALATAN Place 1 drop into both eyes at bedtime.   loratadine 10 MG tablet Commonly known as: CLARITIN Take 10 mg by mouth as needed for allergies.   losartan 25 MG tablet Commonly known as: COZAAR Take 1 tablet (25 mg total) by mouth daily. Start taking on: January 12, 2019   Melatonin 5 MG Caps Take 5 mg by mouth at bedtime.   metoprolol succinate 25 MG 24 hr tablet Commonly known as: TOPROL-XL Take 1 tablet (25 mg total) by mouth daily. Start taking on: January 12, 2019   multivitamin Tabs tablet Take 1 tablet by mouth daily.   nitroGLYCERIN 0.4 MG SL tablet Commonly known as: NITROSTAT PLACE 1 TABLET BY MOUTH UNDER THE TONGUE EVERY 5 MINUTES AS NEEDED FOR CHEST PAIN. FOR UP TO 3 DOSES. IF NO RELIEF CALL 911 What changed:   how much to take  how to take this  when to take this  reasons to take this  additional instructions   Vitamin D-3 25 MCG (1000 UT) Caps Take 1,000 Units by mouth daily.      Follow-up Information    Dorothy Spark, MD Follow up.   Specialty: Cardiology Why: Office will call you to make follow-up appointment for 1-2 weeks  post-discharge Contact information: [address] [place] [address] [phone]        Benito Mccreedy, MD. Schedule an appointment as soon as possible for a visit in 2 week(s).   Specialty: Internal Medicine Contact information: [address] [place] [address] [phone]          No Known Allergies  Consultations:  Cardiology  Nephrology  Procedures/Studies: Dg Chest 2 View  Result Date: 01/07/2019 CLINICAL DATA:  Patient with generalized chest pain. EXAM: CHEST - 2 VIEW COMPARISON:  Chest radiograph 06/13/2018 FINDINGS: Central venous catheter tip projects over the right atrium. Stable cardiomegaly. Small bilateral pleural effusions. Similar-appearing bibasilar atelectasis. Similar-appearing interstitial opacities bilaterally. Thoracic spine degenerative changes. IMPRESSION: Cardiomegaly. Small bilateral pleural effusions and underlying atelectasis. Mild interstitial edema. Electronically Signed   By: Lovey Newcomer M.D.   On: 01/07/2019 18:24   Dg Chest Port 1v Same Day  Result Date: 01/07/2019 CLINICAL DATA:  Shortness of breath. EXAM: PORTABLE CHEST 1 VIEW COMPARISON:  Radiographs of January 07, 2019. FINDINGS: Stable cardiomegaly. Status post coronary artery bypass graft. Right internal jugular dialysis catheter is unchanged in position. No pneumothorax or pleural effusion is noted. No acute pulmonary disease is noted. Bony thorax is unremarkable. IMPRESSION: No active disease. Electronically Signed   By: Marijo Conception M.D.   On: 01/07/2019 21:25     Subjective: Eager to go home  Discharge Exam: Vitals:   01/11/19 1045 01/11/19 1141  BP: 140/69 136/75  Pulse: 93 96  Resp: 19 15  Temp: 98.4 F (36.9 C)   SpO2: 96%    Vitals:   01/11/19 0945 01/11/19 1015 01/11/19 1045 01/11/19 1141  BP: 121/65 (!) 145/75 140/69 136/75  Pulse: 92 94 93 96  Resp:   19 15  Temp:   98.4 F (36.9 C)   TempSrc:   Oral   SpO2:   96%    Weight:   73 kg   Height:        General: Pt is alert, awake, not in acute distress Cardiovascular: RRR, S1/S2 +, no rubs, no gallops Respiratory: CTA bilaterally, no wheezing, no rhonchi Abdominal: Soft, NT, ND, bowel sounds + Extremities: no edema, no cyanosis   The results of significant diagnostics from this hospitalization (including imaging, microbiology, ancillary and laboratory) are listed below for reference.     Microbiology: Recent Results (from the past 240 hour(s))  SARS Coronavirus 2 (Hosp order,Performed in [facility] lab via Abbott ID)     Status: None   Collection Time: 01/07/19  8:15 PM   Specimen: Dry Nasal Swab (Abbott ID Now)  Result Value Ref Range Status   SARS Coronavirus 2 (Abbott ID Now) NEGATIVE NEGATIVE Final    Comment: (NOTE) SARS-CoV-2 target nucleic acids are NOT DETECTED. The SARS-CoV-2 RNA is generally detectable in upper and lower respiratory specimens during the acute phase of infection.  Negativeresults do not preclude SARS-CoV-2 infection, do not rule out coinfections with other pathogens, and should not be used as the  sole basis for treatment or other patient management decisions.  Negative results must be combined with clinical observations, patient history, and epidemiological information. The expected result is Negative. Fact Sheet for Patients: GolfingFamily.no Fact Sheet for Healthcare Providers: https://www.hernandez-brewer.com/ This test is not yet approved or cleared by the [place] FDA and  has been authorized for detection and/or diagnosis of SARS-CoV-2 by FDA under an Emergency Use Authorization (EUA).  This EUA will remain in effect (meaning this test can be used) for the duration of  the COVID19 declaration under Section 5 64(b)(1) of the Act, 21 U.S.C.  section [phone] 3(b)(1), unless the authorization is terminated or revoked sooner. Performed at [facility], [address] [place], [place] [address]   MRSA PCR Screening     Status: None   Collection Time: 01/08/19  2:56 AM   Specimen: Nasal Mucosa; Nasopharyngeal  Result Value Ref Range Status   MRSA by PCR NEGATIVE NEGATIVE Final    Comment:        The GeneXpert MRSA Assay (FDA approved for NASAL specimens only), is one component of a comprehensive MRSA colonization surveillance program. It is not intended to diagnose MRSA infection nor to guide or monitor treatment for MRSA infections. Performed at [facility], [place] [address] [place], [place] [address]      Labs: BNP (last 3 results) Recent Labs    01/07/19 1850  BNP 2,229.7*   Basic Metabolic Panel: Recent  Labs  Lab 01/07/19 1850 01/08/19 0220 01/10/19 0453 01/11/19 0234  NA 139 137 136 134*  K 4.4 4.9 4.1 4.4  CL 102 101 100 98  CO2 24 22 25 22   GLUCOSE 133* 182* 146* 176*  BUN 40* 41* 37* 48*  CREATININE 4.63* 4.74* 4.28* 5.11*  CALCIUM 9.0 8.7* 8.7* 8.4*   Liver Function Tests: No results for input(s): AST, ALT, ALKPHOS, BILITOT, PROT, ALBUMIN in the last 168 hours. No results for input(s): LIPASE, AMYLASE in the last 168 hours. No results for input(s): AMMONIA in the last 168 hours. CBC: Recent Labs  Lab 01/08/19 0220 01/08/19 1902 01/09/19 0216 01/10/19 0453 01/11/19 0234  WBC 7.6 9.3 8.8 6.3 5.5  HGB 12.9 13.5 13.7 12.4 12.1  HCT 41.5 41.8 43.2 38.6 36.8  MCV 92.8 90.3 91.5 89.6 88.7  PLT 202 213 205 196 180   Cardiac Enzymes: No results for input(s): CKTOTAL, CKMB, CKMBINDEX, TROPONINI in the last 168 hours. BNP: Invalid input(s): POCBNP CBG: Recent Labs  Lab 01/10/19 0636 01/10/19 1205 01/10/19 2100 01/11/19 0607 01/11/19 1100  GLUCAP 131* 102* 114* 141* 99   D-Dimer No results for input(s): DDIMER in the last 72 hours. Hgb A1c No results for input(s): HGBA1C in the last 72 hours. Lipid Profile No results for input(s): CHOL, HDL, LDLCALC, TRIG, CHOLHDL, LDLDIRECT in the  last 72 hours. Thyroid function studies No results for input(s): TSH, T4TOTAL, T3FREE, THYROIDAB in the last 72 hours.  Invalid input(s): FREET3 Anemia work up No results for input(s): VITAMINB12, FOLATE, FERRITIN, TIBC, IRON, RETICCTPCT in the last 72 hours. Urinalysis    Component Value Date/Time   COLORURINE ORANGE (A) 09/20/2016 2045   APPEARANCEUR CLEAR 09/20/2016 2045   APPEARANCEUR Hazy 07/09/2012 0108   LABSPEC 1.010 09/20/2016 2045   LABSPEC 1.010 07/09/2012 0108   PHURINE 6.0 09/20/2016 2045   GLUCOSEU NEGATIVE 09/20/2016 2045   GLUCOSEU >=500 07/09/2012 0108   HGBUR SMALL (A) 09/20/2016 2045   BILIRUBINUR NEGATIVE 09/20/2016 2045   BILIRUBINUR Negative 07/09/2012 0108   KETONESUR NEGATIVE 09/20/2016 2045   PROTEINUR 100 (A) 09/20/2016 2045   UROBILINOGEN 0.2 11/22/2012 1809   NITRITE POSITIVE (A) 09/20/2016 2045   LEUKOCYTESUR TRACE (A) 09/20/2016 2045   LEUKOCYTESUR 1+ 07/09/2012 0108   Sepsis Labs Invalid input(s): PROCALCITONIN,  WBC,  LACTICIDVEN Microbiology Recent Results (from the past 240 hour(s))  SARS Coronavirus 2 (Hosp order,Performed in [place] lab via Abbott ID)     Status: None   Collection Time: 01/07/19  8:15 PM   Specimen: Dry Nasal Swab (Abbott ID Now)  Result Value Ref Range Status   SARS Coronavirus 2 (Abbott ID Now) NEGATIVE NEGATIVE Final    Comment: (NOTE) SARS-CoV-2 target nucleic acids are NOT DETECTED. The SARS-CoV-2 RNA is generally detectable in upper and lower respiratory specimens during the acute phase of infection.  Negativeresults do not preclude SARS-CoV-2 infection, do not rule out coinfections with other pathogens, and should not be used as the  sole basis for treatment or other patient management decisions.  Negative results must be combined with clinical observations, patient history, and epidemiological information. The expected result is Negative. Fact Sheet for  Patients: GolfingFamily.no Fact Sheet for Healthcare Providers: https://www.hernandez-brewer.com/ This test is not yet approved or cleared by the [place] FDA and  has been authorized for detection and/or diagnosis of SARS-CoV-2 by FDA under an Emergency Use Authorization (EUA).  This EUA will remain in effect (meaning this test can be used) for the  duration of  the COVID19 declaration under Section 5 64(b)(1) of the Act, 21 U.S.C.  section [phone] 3(b)(1), unless the authorization is terminated or revoked sooner. Performed at [facility], [address] [place], [place] [address]   MRSA PCR Screening     Status: None   Collection Time: 01/08/19  2:56 AM   Specimen: Nasal Mucosa; Nasopharyngeal  Result Value Ref Range Status   MRSA by PCR NEGATIVE NEGATIVE Final    Comment:        The GeneXpert MRSA Assay (FDA approved for NASAL specimens only), is one component of a comprehensive MRSA colonization surveillance program. It is not intended to diagnose MRSA infection nor to guide or monitor treatment for MRSA infections. Performed at [facility], [place] [address] [place], [place] [address]    Time spent: 30 min  SIGNED:   Marylu Lund, MD  [facility] 01/11/2019, 1:09 PM  If 7PM-7AM, please contact night-coverage

## 2019-01-11 NOTE — Progress Notes (Signed)
Patient provided with verbal discharge instructions. Daughter Laverne Whitmire at beside as well. Paper copy of discharge summary provided to patient. No further questions from patient or daughter. VSS at discharge. [facility] brought prescription to bedside. IV's removed per order. Patient belongings send with daughter. Patient discharge via wheelchair through [employer] entrance by [employer] to private vehicle.

## 2019-01-11 NOTE — Progress Notes (Signed)
Renal Navigator notified OP HD clinic/[facility] [address]) to inform of patient's admission and negative COVID 19 test result in order to provide continuity of care and safety.  Alphonzo Cruise, [place] Renal Navigator [phone]

## 2019-01-11 NOTE — Procedures (Addendum)
   I was present at this dialysis session, have reviewed the session itself and made  appropriate changes Kelly Splinter MD [place] pager [phone]   01/11/2019, 8:23 AM

## 2019-01-12 ENCOUNTER — Other Ambulatory Visit: Payer: Self-pay

## 2019-01-12 NOTE — Patient Outreach (Signed)
[facility]) Care Management  01/12/2019  Monica [name] 1930/10/06 [id number]   Referral Date: 01/12/2019 Referral Source:  [employer] Report Date of Admission: 01/07/2019 Diagnosis:  Unstable angina Date of Discharge: 01/11/2019 Facility:  [place]:  [facility]  Incoming call from patient. She is able to verify HIPAA.  Her daughter Monica [name] is on the call also.  Patient states that she is doing good since being home.   Social: Patient lives alone but has several children who are active in her care.  Patient is independent with her own care but her children make sure she has meals and transportation.  Children also spend the night sometimes with the patient.    Conditions: per record: 85 year old female with a history of previous MI, coronary artery bypass grafting 2 years ago, end-stage renal disease on dialysis TTS at [facility].  Children take her to dialysis.  Patient with recent chest pain found to have a heart attack and stents were placed.  Patient has done well and is back home and able to take care of her self but is slower per her daughter.  Education provided on recurrent MI and stroke symptoms, and stent placement site care.  They verbalized understanding.    Medications: Daughter Monica [name] able to review medications via discharge summary and states that patient is taking all her medications and has filled all new and different prescriptions.    Appointments: Patient has PCP follow up on 01/24/2019 and children will transport.    Consent: RN CM reviewed [facility] services with patient and daughter.  They are agreeable to nurse follow up calls to patient at this time.    Plan: RN CM will sent welcome packet to patient. RN CM will provide education and support to patient with the goal of remaining out of the hospital.  RN CM will send assessment and barriers letter to physician. RN CM will outreach patient next week and patient agreeable to check in.     Jone Baseman, RN, MSN [facility] Care Management Care Management Coordinator Direct Line [phone] Toll Free: [phone]  Fax: [phone]

## 2019-01-12 NOTE — Patient Outreach (Signed)
[facility]) Care Management  01/12/2019  Monica [name] 1930/12/15 [id number]   Referral Date: 01/12/2019 Referral Source:  [employer] Report Date of Admission: 01/07/2019 Diagnosis:  Unstable angina Date of Discharge: 01/11/2019 Facility:  [place]:  [facility]  Outreach attempt: Telephone call to patient.  Female answered stating patient is asleep.  CM contact information given for return call.    Plan: RN CM will attempt again within 4 business days and send letter.   Jone Baseman, RN, MSN [facility] Care Management Care Management Coordinator Direct Line [phone] Toll Free: [phone]  Fax: [phone]

## 2019-01-13 DIAGNOSIS — N186 End stage renal disease: Secondary | ICD-10-CM | POA: Diagnosis not present

## 2019-01-13 DIAGNOSIS — N2581 Secondary hyperparathyroidism of renal origin: Secondary | ICD-10-CM | POA: Diagnosis not present

## 2019-01-13 DIAGNOSIS — E119 Type 2 diabetes mellitus without complications: Secondary | ICD-10-CM | POA: Diagnosis not present

## 2019-01-13 DIAGNOSIS — M109 Gout, unspecified: Secondary | ICD-10-CM | POA: Diagnosis not present

## 2019-01-13 DIAGNOSIS — Z23 Encounter for immunization: Secondary | ICD-10-CM | POA: Diagnosis not present

## 2019-01-13 DIAGNOSIS — E785 Hyperlipidemia, unspecified: Secondary | ICD-10-CM | POA: Diagnosis not present

## 2019-01-13 DIAGNOSIS — D631 Anemia in chronic kidney disease: Secondary | ICD-10-CM | POA: Diagnosis not present

## 2019-01-14 ENCOUNTER — Ambulatory Visit: Payer: Medicare HMO

## 2019-01-15 DIAGNOSIS — D509 Iron deficiency anemia, unspecified: Secondary | ICD-10-CM | POA: Diagnosis not present

## 2019-01-15 DIAGNOSIS — N2581 Secondary hyperparathyroidism of renal origin: Secondary | ICD-10-CM | POA: Diagnosis not present

## 2019-01-15 DIAGNOSIS — D631 Anemia in chronic kidney disease: Secondary | ICD-10-CM | POA: Diagnosis not present

## 2019-01-15 DIAGNOSIS — N186 End stage renal disease: Secondary | ICD-10-CM | POA: Diagnosis not present

## 2019-01-17 ENCOUNTER — Emergency Department (HOSPITAL_BASED_OUTPATIENT_CLINIC_OR_DEPARTMENT_OTHER): Payer: Medicare HMO

## 2019-01-17 ENCOUNTER — Other Ambulatory Visit: Payer: Self-pay

## 2019-01-17 ENCOUNTER — Encounter (HOSPITAL_BASED_OUTPATIENT_CLINIC_OR_DEPARTMENT_OTHER): Payer: Self-pay | Admitting: Emergency Medicine

## 2019-01-17 ENCOUNTER — Inpatient Hospital Stay (HOSPITAL_BASED_OUTPATIENT_CLINIC_OR_DEPARTMENT_OTHER)
Admission: EM | Admit: 2019-01-17 | Discharge: 2019-01-19 | DRG: 280 | Disposition: A | Discharge status: Home / Self Care | Payer: Medicare HMO | Attending: Internal Medicine | Admitting: Internal Medicine

## 2019-01-17 DIAGNOSIS — Z8711 Personal history of peptic ulcer disease: Secondary | ICD-10-CM

## 2019-01-17 DIAGNOSIS — Y929 Unspecified place or not applicable: Secondary | ICD-10-CM | POA: Diagnosis not present

## 2019-01-17 DIAGNOSIS — D631 Anemia in chronic kidney disease: Secondary | ICD-10-CM | POA: Diagnosis not present

## 2019-01-17 DIAGNOSIS — I132 Hypertensive heart and chronic kidney disease with heart failure and with stage 5 chronic kidney disease, or end stage renal disease: Secondary | ICD-10-CM | POA: Diagnosis not present

## 2019-01-17 DIAGNOSIS — I25709 Atherosclerosis of coronary artery bypass graft(s), unspecified, with unspecified angina pectoris: Secondary | ICD-10-CM | POA: Diagnosis present

## 2019-01-17 DIAGNOSIS — I5042 Chronic combined systolic (congestive) and diastolic (congestive) heart failure: Secondary | ICD-10-CM | POA: Diagnosis not present

## 2019-01-17 DIAGNOSIS — I252 Old myocardial infarction: Secondary | ICD-10-CM

## 2019-01-17 DIAGNOSIS — Z79899 Other long term (current) drug therapy: Secondary | ICD-10-CM

## 2019-01-17 DIAGNOSIS — F0390 Unspecified dementia without behavioral disturbance: Secondary | ICD-10-CM | POA: Diagnosis present

## 2019-01-17 DIAGNOSIS — R001 Bradycardia, unspecified: Secondary | ICD-10-CM | POA: Diagnosis present

## 2019-01-17 DIAGNOSIS — Z79890 Hormone replacement therapy (postmenopausal): Secondary | ICD-10-CM

## 2019-01-17 DIAGNOSIS — E875 Hyperkalemia: Secondary | ICD-10-CM | POA: Diagnosis present

## 2019-01-17 DIAGNOSIS — I214 Non-ST elevation (NSTEMI) myocardial infarction: Secondary | ICD-10-CM | POA: Diagnosis not present

## 2019-01-17 DIAGNOSIS — Z951 Presence of aortocoronary bypass graft: Secondary | ICD-10-CM | POA: Diagnosis not present

## 2019-01-17 DIAGNOSIS — Z7902 Long term (current) use of antithrombotics/antiplatelets: Secondary | ICD-10-CM

## 2019-01-17 DIAGNOSIS — K219 Gastro-esophageal reflux disease without esophagitis: Secondary | ICD-10-CM | POA: Diagnosis present

## 2019-01-17 DIAGNOSIS — J45909 Unspecified asthma, uncomplicated: Secondary | ICD-10-CM | POA: Diagnosis present

## 2019-01-17 DIAGNOSIS — I2510 Atherosclerotic heart disease of native coronary artery without angina pectoris: Secondary | ICD-10-CM | POA: Diagnosis present

## 2019-01-17 DIAGNOSIS — Z1159 Encounter for screening for other viral diseases: Secondary | ICD-10-CM

## 2019-01-17 DIAGNOSIS — N390 Urinary tract infection, site not specified: Secondary | ICD-10-CM | POA: Diagnosis present

## 2019-01-17 DIAGNOSIS — Z794 Long term (current) use of insulin: Secondary | ICD-10-CM

## 2019-01-17 DIAGNOSIS — Z955 Presence of coronary angioplasty implant and graft: Secondary | ICD-10-CM | POA: Diagnosis not present

## 2019-01-17 DIAGNOSIS — M1A49X0 Other secondary chronic gout, multiple sites, without tophus (tophi): Secondary | ICD-10-CM | POA: Diagnosis not present

## 2019-01-17 DIAGNOSIS — E1122 Type 2 diabetes mellitus with diabetic chronic kidney disease: Secondary | ICD-10-CM | POA: Diagnosis present

## 2019-01-17 DIAGNOSIS — Z7982 Long term (current) use of aspirin: Secondary | ICD-10-CM | POA: Diagnosis not present

## 2019-01-17 DIAGNOSIS — I119 Hypertensive heart disease without heart failure: Secondary | ICD-10-CM | POA: Diagnosis not present

## 2019-01-17 DIAGNOSIS — I471 Supraventricular tachycardia: Secondary | ICD-10-CM | POA: Diagnosis not present

## 2019-01-17 DIAGNOSIS — Z8249 Family history of ischemic heart disease and other diseases of the circulatory system: Secondary | ICD-10-CM | POA: Diagnosis not present

## 2019-01-17 DIAGNOSIS — I120 Hypertensive chronic kidney disease with stage 5 chronic kidney disease or end stage renal disease: Secondary | ICD-10-CM | POA: Diagnosis not present

## 2019-01-17 DIAGNOSIS — R0602 Shortness of breath: Secondary | ICD-10-CM | POA: Diagnosis not present

## 2019-01-17 DIAGNOSIS — Z992 Dependence on renal dialysis: Secondary | ICD-10-CM | POA: Diagnosis not present

## 2019-01-17 DIAGNOSIS — R42 Dizziness and giddiness: Secondary | ICD-10-CM | POA: Diagnosis not present

## 2019-01-17 DIAGNOSIS — N186 End stage renal disease: Secondary | ICD-10-CM | POA: Diagnosis not present

## 2019-01-17 DIAGNOSIS — E785 Hyperlipidemia, unspecified: Secondary | ICD-10-CM | POA: Diagnosis present

## 2019-01-17 DIAGNOSIS — R778 Other specified abnormalities of plasma proteins: Secondary | ICD-10-CM

## 2019-01-17 DIAGNOSIS — Z90710 Acquired absence of both cervix and uterus: Secondary | ICD-10-CM

## 2019-01-17 DIAGNOSIS — E1151 Type 2 diabetes mellitus with diabetic peripheral angiopathy without gangrene: Secondary | ICD-10-CM | POA: Diagnosis present

## 2019-01-17 DIAGNOSIS — T447X5A Adverse effect of beta-adrenoreceptor antagonists, initial encounter: Secondary | ICD-10-CM | POA: Diagnosis present

## 2019-01-17 DIAGNOSIS — I080 Rheumatic disorders of both mitral and aortic valves: Secondary | ICD-10-CM | POA: Diagnosis present

## 2019-01-17 DIAGNOSIS — R7989 Other specified abnormal findings of blood chemistry: Secondary | ICD-10-CM | POA: Diagnosis present

## 2019-01-17 DIAGNOSIS — E782 Mixed hyperlipidemia: Secondary | ICD-10-CM | POA: Diagnosis not present

## 2019-01-17 DIAGNOSIS — I495 Sick sinus syndrome: Secondary | ICD-10-CM | POA: Diagnosis not present

## 2019-01-17 DIAGNOSIS — Z20828 Contact with and (suspected) exposure to other viral communicable diseases: Secondary | ICD-10-CM | POA: Diagnosis not present

## 2019-01-17 LAB — CBC WITH DIFFERENTIAL/PLATELET
Abs Immature Granulocytes: 0.01 10*3/uL (ref 0.00–0.07)
Basophils Absolute: 0 10*3/uL (ref 0.0–0.1)
Basophils Relative: 1 %
Eosinophils Absolute: 0.1 10*3/uL (ref 0.0–0.5)
Eosinophils Relative: 2 %
HCT: 39.5 % (ref 36.0–46.0)
Hemoglobin: 11.8 g/dL — ABNORMAL LOW (ref 12.0–15.0)
Immature Granulocytes: 0 %
Lymphocytes Relative: 35 %
Lymphs Abs: 2 10*3/uL (ref 0.7–4.0)
MCH: 28.2 pg (ref 26.0–34.0)
MCHC: 29.9 g/dL — ABNORMAL LOW (ref 30.0–36.0)
MCV: 94.5 fL (ref 80.0–100.0)
Monocytes Absolute: 0.4 10*3/uL (ref 0.1–1.0)
Monocytes Relative: 8 %
Neutro Abs: 3.2 10*3/uL (ref 1.7–7.7)
Neutrophils Relative %: 54 %
Platelets: 195 10*3/uL (ref 150–400)
RBC: 4.18 MIL/uL (ref 3.87–5.11)
RDW: 18.4 % — ABNORMAL HIGH (ref 11.5–15.5)
WBC: 5.8 10*3/uL (ref 4.0–10.5)
nRBC: 0 % (ref 0.0–0.2)

## 2019-01-17 LAB — COMPREHENSIVE METABOLIC PANEL
ALT: 46 U/L — ABNORMAL HIGH (ref 0–44)
AST: 48 U/L — ABNORMAL HIGH (ref 15–41)
Albumin: 3.7 g/dL (ref 3.5–5.0)
Alkaline Phosphatase: 68 U/L (ref 38–126)
Anion gap: 11 (ref 5–15)
BUN: 45 mg/dL — ABNORMAL HIGH (ref 8–23)
CO2: 25 mmol/L (ref 22–32)
Calcium: 8.5 mg/dL — ABNORMAL LOW (ref 8.9–10.3)
Chloride: 97 mmol/L — ABNORMAL LOW (ref 98–111)
Creatinine, Ser: 6.67 mg/dL — ABNORMAL HIGH (ref 0.44–1.00)
GFR calc Af Amer: 6 mL/min — ABNORMAL LOW (ref >60)
GFR calc non Af Amer: 5 mL/min — ABNORMAL LOW (ref >60)
Glucose, Bld: 148 mg/dL — ABNORMAL HIGH (ref 70–99)
Potassium: 5.6 mmol/L — ABNORMAL HIGH (ref 3.5–5.1)
Sodium: 133 mmol/L — ABNORMAL LOW (ref 135–145)
Total Bilirubin: 0.7 mg/dL (ref 0.3–1.2)
Total Protein: 7.2 g/dL (ref 6.5–8.1)

## 2019-01-17 LAB — URINALYSIS, ROUTINE W REFLEX MICROSCOPIC
Bilirubin Urine: NEGATIVE
Glucose, UA: NEGATIVE mg/dL
Ketones, ur: NEGATIVE mg/dL
Nitrite: POSITIVE — AB
Protein, ur: 100 mg/dL — AB
Specific Gravity, Urine: 1.01 (ref 1.005–1.030)
pH: 7.5 (ref 5.0–8.0)

## 2019-01-17 LAB — TROPONIN I (HIGH SENSITIVITY)
Troponin I (High Sensitivity): 34 ng/L — ABNORMAL HIGH (ref <18)
Troponin I (High Sensitivity): 39 ng/L — ABNORMAL HIGH (ref <18)

## 2019-01-17 LAB — URINALYSIS, MICROSCOPIC (REFLEX): WBC, UA: >50 WBC/hpf (ref 0–5)

## 2019-01-17 LAB — SARS CORONAVIRUS 2 AG (30 MIN TAT): SARS Coronavirus 2 Ag: NEGATIVE

## 2019-01-17 LAB — LACTIC ACID, PLASMA: Lactic Acid, Venous: 1.2 mmol/L (ref 0.5–1.9)

## 2019-01-17 LAB — BRAIN NATRIURETIC PEPTIDE: B Natriuretic Peptide: 1643.2 pg/mL — ABNORMAL HIGH (ref 0.0–100.0)

## 2019-01-17 NOTE — ED Provider Notes (Signed)
[facility] EMERGENCY DEPARTMENT Provider Note   CSN: [id number] Arrival date & time: 01/17/19  1810    History   Chief Complaint Chief Complaint  Patient presents with  . Shortness of Breath    HPI Monica [name] is a 85 y.o. female.      Patient with a recent admission to the hospital July 3 through July 7 for a non-STEMI as well as some respiratory failure.  Patient was on the hospitalist service.  Patient had a stent placed on July 6 by cardiology.  Patient went to see her primary care doctor today and was referred over here for concerns that initially were not very clear.  Patient's been complaining of intermittent shortness of breath.  But in conversations with the patient's daughter it sounds as if they wanted to cut her beta-blocker Toprol XL and half.  Then they decided that she needed to be seen.  I was suspicious that she probably had a slow heart rate.  But upon arrival here patient had a heart rate around 103.  But patient had just used her beta agonist and perhaps that is the reason why the heart rate was up.  But it sounds as if her primary care provider was concerned about a slow heart rate and too much beta-blocker on board.  Patient also has a history of congestive heart failure.  She is end-stage renal dialysis patient.  Normally dialyzed Tuesday Thursdays and Saturdays was dialyzed on Saturday.  Normally be dialyzed again tomorrow.  Patient without any chest pain she had a brief period of chest pain shortly after discharge from the hospital but none recently.  Patient states that she just had intermittent shortness of breath and has not felt and has felt winded.     Past Medical History:  Diagnosis Date  . Asthma   . Bilateral pleural effusion    a. s/p CABG >> s/p bilat thoracentesis 11/2015  . Candida infection 10/2015   Bilateral breasts  . Chronic diastolic CHF    Echo 18/29 [facility]):  EF 55-65, normal wall motion, normal diastolic function  .  Coronary artery disease    LHC 3/17 >> 3 v CAD >> CABG // MV 01/2018:  Abnormal, low risk stress nuclear study with significant breast attenuation; cannot R/O prior infarct; no ischemia; EF 56 with normal wall motion. // s/p NSTEMI 11/19 [facility]) >> LHC:  dLM 90, pLAD 100, pLCx 100, mRCA 100, L-LAD ok, S-Dx prox 70, S-OM dist 99 (ulc); S-PDA 100 >>PCI: 2.25 x 24 mm Synergy DES to S-OM //  EF normal by Echo 11/19  . ESRD (end stage renal disease)    Dialysis Tu, Th, Sa  . Essential hypertension   . Gastroesophageal reflux disease   . History of blood transfusion   . History of echocardiogram    a. Echo 3/17: Mild focal basal septal hypertrophy, vigorous LVF, EF 65-70%, normal wall motion, grade 1 diastolic dysfunction, MAC  . Hyperlipidemia   . Long Term Cardiac Monitor 3-14 day    LT Monitor 09/2018: SR, Sinus Tach, 6 beats NSVT, 38 runs of SVT (brief); no AFib.  . Mass   . Peptic ulcer   . Protein calorie malnutrition ([place])   . Type 2 diabetes mellitus [facility])     Patient Active Problem List   Diagnosis Date Noted  . S/P coronary artery stent placement   . Acute combined systolic and diastolic heart failure ([place])   . NSTEMI (non-ST  elevated myocardial infarction) ([place]) 01/08/2019  . Acute respiratory failure ([place]) 01/07/2019  . Diabetes mellitus with peripheral circulatory disorder ([place]) 02/05/2018  . Altered mental status 03/13/2017  . Claudication in peripheral vascular disease ([place]) 12/29/2016  . Abnormal weight loss 06/05/2016  . Bilateral pleural effusion 11/23/2015  . Chronic diastolic CHF (congestive heart failure) ([place]) 11/23/2015  . SOB (shortness of breath) 11/23/2015  . Pleural effusion 11/23/2015  . CAD (coronary artery disease) 10/05/2015  . S/P CABG x 4 09/19/2015  . History of coronary artery bypass surgery 09/19/2015  . Abnormal finding on EKG - dynamic changes 09/17/2015  . Pain in the chest   . Gastroesophageal reflux disease 09/15/2015  . Gout 09/15/2015   . Chest pain 09/15/2015  . Hyperlipidemia   . Diabetes mellitus with renal complications ([place])   . Obesity (BMI 30.0-34.9) 08/02/2015  . Asthma 04/27/2015  . Vitamin D deficiency 04/27/2015  . Hyperlipidemia associated with type 2 diabetes mellitus ([place]) 04/27/2015  . Essential hypertension 04/24/2015  . ESRD (end stage renal disease) ([place]) 04/24/2015  . Type 2 diabetes mellitus with hyperglycemia ([place]) 04/24/2015  . Metatarsalgia of both feet 07/25/2014  . Equinus deformity of foot, acquired 07/25/2014  . Onychomycosis 07/25/2014  . Pain in lower limb 07/25/2014  . Acquired equinus deformity of foot 07/25/2014  . Metatarsalgia 07/25/2014  . Pain of lower extremity 07/25/2014    Past Surgical History:  Procedure Laterality Date  . ABDOMINAL HYSTERECTOMY    . CARDIAC CATHETERIZATION N/A 09/17/2015   Procedure: Left Heart Cath and Coronary Angiography;  Surgeon: Jettie Booze, MD;  Location: [facility] CV LAB;  Service: Cardiovascular;  Laterality: N/A;  . COLON SURGERY    . CORONARY ARTERY BYPASS GRAFT N/A 09/19/2015   Procedure: CORONARY ARTERY BYPASS GRAFTING (CABG) TIMES FOUR USING BILATARAL SAPHENOUS VEIN GRAFTS AND LEFT INTERNAL MAMMARY ARTERY;  Surgeon: [name], MD;  Location: [facility];  Service: Open Heart Surgery;  Laterality: N/A;  . CORONARY STENT INTERVENTION N/A 01/10/2019   Procedure: CORONARY STENT INTERVENTION;  Surgeon: Leonie Man, MD;  Location: [facility] CV LAB;  Service: Cardiovascular;  Laterality: N/A;  . LEFT HEART CATH AND CORS/GRAFTS ANGIOGRAPHY N/A 01/10/2019   Procedure: LEFT HEART CATH AND CORS/GRAFTS ANGIOGRAPHY;  Surgeon: Leonie Man, MD;  Location: [facility] CV LAB;  Service: Cardiovascular;  Laterality: N/A;  . TEE WITHOUT CARDIOVERSION N/A 09/19/2015   Procedure: TRANSESOPHAGEAL ECHOCARDIOGRAM (TEE);  Surgeon: [name], MD;  Location: [facility];  Service: Open Heart Surgery;  Laterality: N/A;     OB History   No obstetric  history on file.      Home Medications    Prior to Admission medications   Medication Sig Start Date End Date Taking? Authorizing Provider  ciprofloxacin (CIPRO) 250 MG tablet Take 250 mg by mouth 2 (two) times daily. 01/17/19 01/20/19 Yes [provider]  acetaminophen (TYLENOL) 500 MG tablet Take 1,000 mg by mouth every 6 (six) hours as needed for mild pain or headache.    [provider]  albuterol (PROVENTIL HFA;VENTOLIN HFA) 108 (90 Base) MCG/ACT inhaler Inhale 2 puffs into the lungs every 6 (six) hours as needed for wheezing or shortness of breath.    [provider]  allopurinol (ZYLOPRIM) 100 MG tablet Take 100 mg by mouth 2 (two) times daily.     [provider]  aspirin EC 81 MG EC tablet Take 1 tablet (81 mg total) by mouth daily. 10/03/15   Barrett,  Erin R, PA-C  atorvastatin (LIPITOR) 80 MG tablet Take 80 mg by mouth daily.  07/12/18   [provider]  Cholecalciferol (VITAMIN D-3) 1000 units CAPS Take 1,000 Units by mouth daily.     [provider]  clopidogrel (PLAVIX) 75 MG tablet Take 75 mg by mouth daily.     [provider]  diphenhydrAMINE (BENADRYL) 25 MG tablet Take 25 mg by mouth as needed for itching or allergies.     [provider]  ferric citrate (AURYXIA) 1 GM 210 MG(Fe) tablet Take 210 mg by mouth 3 (three) times daily with meals.     [provider]  hydrALAZINE (APRESOLINE) 25 MG tablet Take 1 tablet (25 mg total) by mouth 3 (three) times daily. 01/11/19 01/11/20  Donne Hazel, MD  insulin glargine (LANTUS) 100 UNIT/ML injection Inject 10 Units into the skin at bedtime.     [provider]  isosorbide mononitrate (IMDUR) 30 MG 24 hr tablet Take 30 mg by mouth daily.    [provider]  latanoprost (XALATAN) 0.005 % ophthalmic solution Place 1 drop into both eyes at bedtime. 07/05/18   [provider]  loratadine (CLARITIN) 10 MG tablet Take 10 mg by mouth as  needed for allergies.     [provider]  losartan (COZAAR) 25 MG tablet Take 1 tablet (25 mg total) by mouth daily. 01/12/19 02/11/19  Donne Hazel, MD  Melatonin 5 MG CAPS Take 5 mg by mouth at bedtime.     [provider]  metoprolol succinate (TOPROL-XL) 25 MG 24 hr tablet Take 1 tablet (25 mg total) by mouth daily. Patient taking differently: Take 25 mg by mouth daily. 1/2 tab 01/12/19 02/11/19  Donne Hazel, MD  multivitamin (RENA-VIT) TABS tablet Take 1 tablet by mouth daily.    [provider]  nitroGLYCERIN (NITROSTAT) 0.4 MG SL tablet PLACE 1 TABLET BY MOUTH UNDER THE TONGUE EVERY 5 MINUTES AS NEEDED FOR CHEST PAIN. FOR UP TO 3 DOSES. IF NO RELIEF CALL 911 Patient taking differently: Place 0.4 mg under the tongue every 5 (five) minutes as needed for chest pain. FOR UP TO 3 DOSES. IF NO RELIEF CALL 911 07/21/18   Dorothy Spark, MD    Family History Family History  Problem Relation Age of Onset  . Heart attack Father     Social History Social History   Tobacco Use  . Smoking status: Never Smoker  . Smokeless tobacco: Former Systems developer    Types: Snuff  Substance Use Topics  . Alcohol use: No    Alcohol/week: 0.0 standard drinks  . Drug use: No     Allergies   Patient has no known allergies.   Review of Systems Review of Systems  Constitutional: Negative for chills and fever.  HENT: Negative for congestion, rhinorrhea and sore throat.   Eyes: Negative for visual disturbance.  Respiratory: Positive for shortness of breath. Negative for cough.   Cardiovascular: Negative for chest pain and leg swelling.  Gastrointestinal: Negative for abdominal pain, diarrhea, nausea and vomiting.  Genitourinary: Negative for dysuria.  Musculoskeletal: Negative for back pain and neck pain.  Skin: Negative for rash.  Neurological: Negative for dizziness, light-headedness and headaches.  Hematological: Does not bruise/bleed easily.  Psychiatric/Behavioral:  Negative for confusion.     Physical Exam Updated Vital Signs BP (!) 118/42   Pulse (!) 52   Temp 98.3 F (36.8 C) (Oral)   Resp 15   Wt 73.1 kg  SpO2 97%   BMI 29.48 kg/m   Physical Exam Vitals signs and nursing note reviewed.  Constitutional:      General: She is not in acute distress.    Appearance: Normal appearance. She is well-developed.  HENT:     Head: Normocephalic and atraumatic.  Eyes:     Extraocular Movements: Extraocular movements intact.     Conjunctiva/sclera: Conjunctivae normal.     Pupils: Pupils are equal, round, and reactive to light.  Neck:     Musculoskeletal: Neck supple.  Cardiovascular:     Rate and Rhythm: Normal rate and regular rhythm.     Heart sounds: No murmur.  Pulmonary:     Effort: Pulmonary effort is normal. No respiratory distress.     Breath sounds: Normal breath sounds. No wheezing or rales.  Abdominal:     Palpations: Abdomen is soft.     Tenderness: There is no abdominal tenderness.  Musculoskeletal: Normal range of motion.     Comments: Trace swelling to both lower extremities.  Skin:    General: Skin is warm and dry.     Capillary Refill: Capillary refill takes less than 2 seconds.  Neurological:     General: No focal deficit present.     Mental Status: She is alert. Mental status is at baseline.      ED Treatments / Results  Labs (all labs ordered are listed, but only abnormal results are displayed) Labs Reviewed  COMPREHENSIVE METABOLIC PANEL - Abnormal; Notable for the following components:      Result Value   Sodium 133 (*)    Potassium 5.6 (*)    Chloride 97 (*)    Glucose, Bld 148 (*)    BUN 45 (*)    Creatinine, Ser 6.67 (*)    Calcium 8.5 (*)    AST 48 (*)    ALT 46 (*)    GFR calc non Af Amer 5 (*)    GFR calc Af Amer 6 (*)    All other components within normal limits  CBC WITH DIFFERENTIAL/PLATELET - Abnormal; Notable for the following components:   Hemoglobin 11.8 (*)    MCHC 29.9 (*)    RDW  18.4 (*)    All other components within normal limits  BRAIN NATRIURETIC PEPTIDE - Abnormal; Notable for the following components:   B Natriuretic Peptide 1,643.2 (*)    All other components within normal limits  URINALYSIS, ROUTINE W REFLEX MICROSCOPIC - Abnormal; Notable for the following components:   APPearance CLOUDY (*)    Hgb urine dipstick MODERATE (*)    Protein, ur 100 (*)    Nitrite POSITIVE (*)    Leukocytes,Ua LARGE (*)    All other components within normal limits  URINALYSIS, MICROSCOPIC (REFLEX) - Abnormal; Notable for the following components:   Bacteria, UA FEW (*)    All other components within normal limits  TROPONIN I (HIGH SENSITIVITY) - Abnormal; Notable for the following components:   Troponin I (High Sensitivity) 34 (*)    All other components within normal limits    EKG EKG Interpretation  Date/Time:  Monday January 17 2019 21:04:47 EDT Ventricular Rate:  46 PR Interval:    QRS Duration: 103 QT Interval:  503 QTC Calculation: 440 R Axis:   -5 Text Interpretation:  Junctional rhythm LVH with secondary repolarization abnormality Anterior Q waves, possibly due to LVH Confirmed by Fredia Sorrow [id number]) on 01/17/2019 9:29:12 PM    ED ECG REPORT   Date: 01/17/2019  Rate: 103  Rhythm: sinus tachycardia  QRS Axis: normal  Intervals: normal  ST/T Wave abnormalities: nonspecific ST/T changes  Conduction Disutrbances:none  Narrative Interpretation:   Old EKG Reviewed: unchanged   Repeat EKG actually has a heart rate of 646 with a bit of a junctional rhythm.  This 1 also did not crossover.  I have personally reviewed the EKG tracing and agree with the computerized printout as noted. EKG did not crossover.  Patient's EKG today was sinus or ectopic atrial tachycardia.  Has some LVH with secondary repolarization abnormality.  And some baseline wandering.  But otherwise no significant change from EKG related to her recent hospitalization first part of July.   Radiology Dg Chest Port 1 View  Result Date: 01/17/2019 CLINICAL DATA:  Shortness of breath EXAM: PORTABLE CHEST 1 VIEW COMPARISON:  01/07/2019 FINDINGS: Right dialysis catheter remain in place, unchanged. Prior CABG. Cardiomegaly. No confluent opacity, effusion or edema. No acute bony abnormality. IMPRESSION: Cardiomegaly.  No active disease. Electronically Signed   By: Rolm Baptise M.D.   On: 01/17/2019 21:19    Procedures Procedures (including critical care time)  Medications Ordered in ED Medications - No data to display   Initial Impression / Assessment and Plan / ED Course  I have reviewed the triage vital signs and the nursing notes.  Pertinent labs & imaging results that were available during my care of the patient were reviewed by me and considered in my medical decision making (see chart for details).       Patient's heart rate eventually went down into the 40s.  Consistent with a junctional rhythm.  Had cardiology review the EKGs.  They agree that this may be due to too much beta-blocker being on board.  Patient's potassium slightly elevated 5.6.  BNP is elevated but not as high as it was before chest x-ray without any significant pulmonary edema.  Actually did ambulate patient with pulse ox with the heart rate in the 40s and patient developed no symptoms and did not drop her sats.  Troponin is elevated and a delta troponin will be required troponin elevated just at 35.  Not as high as 1 patient was admitted for non-STEMI.  Plus patient's had no chest pain.  EKG without any acute changes.  Other than the junctional rhythm.  Also discussed with the hospitalist.  They will admit.  Second troponin is pending lactic acid is pending and a COVID testing is pending.  Patient very asymptomatic lying in the bed.  Good oxygen sats never got below 92%.  Patient without any chest pain.  Patient apparently does have a degree of confusion mostly at night and apparently patient his family  daughter was allowed to stay with her in the hospital during the last admission.  Was not able to provide this information to the admitting hospitalist because I spoke to him before I knew this concern from the family.   Patient does not need immediate dialysis but a normal day would be tomorrow.  This is a reason why patient will be admitted to [facility] where cardiology will be available for the consultation as well as dialysis could occur when necessary.   Final Clinical Impressions(s) / ED Diagnoses   Final diagnoses:  Junctional bradycardia  Elevated troponin    ED Discharge Orders    None       Fredia Sorrow, MD 01/17/19 2313

## 2019-01-17 NOTE — ED Triage Notes (Signed)
Seen by PMD today for f/u. No sx at md appt.

## 2019-01-17 NOTE — ED Triage Notes (Signed)
Shortness of breath onset today. Admitted last week for MI. Discharged to home on 01/11/2019. Denies pain.

## 2019-01-17 NOTE — ED Notes (Signed)
[facility] notified (Tara) - patient ready for transport 

## 2019-01-17 NOTE — ED Notes (Signed)
ED Provider at bedside. 

## 2019-01-17 NOTE — ED Triage Notes (Signed)
Last Dialysis tx was on Saturday 7/12

## 2019-01-17 NOTE — ED Notes (Signed)
Pt assisted to restroom with use of wheelchair. No shortness of breath noted during process. Pt denies shortness of breath with exertion.

## 2019-01-17 NOTE — ED Notes (Signed)
PT resting comfortably. No respiratory distress noted. Able to speak full sentences. Daughter at bedside. Denies pain. Skin warm and dry.

## 2019-01-18 ENCOUNTER — Encounter (HOSPITAL_COMMUNITY): Payer: Self-pay | Admitting: Family Medicine

## 2019-01-18 ENCOUNTER — Other Ambulatory Visit: Payer: Self-pay

## 2019-01-18 DIAGNOSIS — Z8711 Personal history of peptic ulcer disease: Secondary | ICD-10-CM | POA: Diagnosis not present

## 2019-01-18 DIAGNOSIS — Z951 Presence of aortocoronary bypass graft: Secondary | ICD-10-CM | POA: Diagnosis not present

## 2019-01-18 DIAGNOSIS — R7989 Other specified abnormal findings of blood chemistry: Secondary | ICD-10-CM

## 2019-01-18 DIAGNOSIS — E785 Hyperlipidemia, unspecified: Secondary | ICD-10-CM | POA: Diagnosis present

## 2019-01-18 DIAGNOSIS — Z992 Dependence on renal dialysis: Secondary | ICD-10-CM | POA: Diagnosis not present

## 2019-01-18 DIAGNOSIS — Z794 Long term (current) use of insulin: Secondary | ICD-10-CM | POA: Diagnosis not present

## 2019-01-18 DIAGNOSIS — E1122 Type 2 diabetes mellitus with diabetic chronic kidney disease: Secondary | ICD-10-CM | POA: Diagnosis present

## 2019-01-18 DIAGNOSIS — N390 Urinary tract infection, site not specified: Secondary | ICD-10-CM | POA: Diagnosis present

## 2019-01-18 DIAGNOSIS — E875 Hyperkalemia: Secondary | ICD-10-CM | POA: Diagnosis present

## 2019-01-18 DIAGNOSIS — I132 Hypertensive heart and chronic kidney disease with heart failure and with stage 5 chronic kidney disease, or end stage renal disease: Secondary | ICD-10-CM | POA: Diagnosis present

## 2019-01-18 DIAGNOSIS — K219 Gastro-esophageal reflux disease without esophagitis: Secondary | ICD-10-CM | POA: Diagnosis present

## 2019-01-18 DIAGNOSIS — R001 Bradycardia, unspecified: Secondary | ICD-10-CM | POA: Diagnosis not present

## 2019-01-18 DIAGNOSIS — R778 Other specified abnormalities of plasma proteins: Secondary | ICD-10-CM

## 2019-01-18 DIAGNOSIS — I2510 Atherosclerotic heart disease of native coronary artery without angina pectoris: Secondary | ICD-10-CM

## 2019-01-18 DIAGNOSIS — Z955 Presence of coronary angioplasty implant and graft: Secondary | ICD-10-CM | POA: Diagnosis not present

## 2019-01-18 DIAGNOSIS — I471 Supraventricular tachycardia: Secondary | ICD-10-CM | POA: Diagnosis present

## 2019-01-18 DIAGNOSIS — I252 Old myocardial infarction: Secondary | ICD-10-CM | POA: Diagnosis not present

## 2019-01-18 DIAGNOSIS — Z8249 Family history of ischemic heart disease and other diseases of the circulatory system: Secondary | ICD-10-CM | POA: Diagnosis not present

## 2019-01-18 DIAGNOSIS — Y929 Unspecified place or not applicable: Secondary | ICD-10-CM | POA: Diagnosis not present

## 2019-01-18 DIAGNOSIS — Z1159 Encounter for screening for other viral diseases: Secondary | ICD-10-CM | POA: Diagnosis not present

## 2019-01-18 DIAGNOSIS — Z79890 Hormone replacement therapy (postmenopausal): Secondary | ICD-10-CM | POA: Diagnosis not present

## 2019-01-18 DIAGNOSIS — I495 Sick sinus syndrome: Secondary | ICD-10-CM | POA: Diagnosis present

## 2019-01-18 DIAGNOSIS — J45909 Unspecified asthma, uncomplicated: Secondary | ICD-10-CM | POA: Diagnosis present

## 2019-01-18 DIAGNOSIS — E1151 Type 2 diabetes mellitus with diabetic peripheral angiopathy without gangrene: Secondary | ICD-10-CM

## 2019-01-18 DIAGNOSIS — N186 End stage renal disease: Secondary | ICD-10-CM

## 2019-01-18 DIAGNOSIS — I5042 Chronic combined systolic (congestive) and diastolic (congestive) heart failure: Secondary | ICD-10-CM | POA: Diagnosis present

## 2019-01-18 DIAGNOSIS — I214 Non-ST elevation (NSTEMI) myocardial infarction: Secondary | ICD-10-CM | POA: Diagnosis present

## 2019-01-18 DIAGNOSIS — Z7982 Long term (current) use of aspirin: Secondary | ICD-10-CM | POA: Diagnosis not present

## 2019-01-18 LAB — BASIC METABOLIC PANEL
Anion gap: 14 (ref 5–15)
BUN: 43 mg/dL — ABNORMAL HIGH (ref 8–23)
CO2: 22 mmol/L (ref 22–32)
Calcium: 8.6 mg/dL — ABNORMAL LOW (ref 8.9–10.3)
Chloride: 98 mmol/L (ref 98–111)
Creatinine, Ser: 6.8 mg/dL — ABNORMAL HIGH (ref 0.44–1.00)
GFR calc Af Amer: 6 mL/min — ABNORMAL LOW (ref >60)
GFR calc non Af Amer: 5 mL/min — ABNORMAL LOW (ref >60)
Glucose, Bld: 136 mg/dL — ABNORMAL HIGH (ref 70–99)
Potassium: 4.8 mmol/L (ref 3.5–5.1)
Sodium: 134 mmol/L — ABNORMAL LOW (ref 135–145)

## 2019-01-18 LAB — CBC
HCT: 35.9 % — ABNORMAL LOW (ref 36.0–46.0)
Hemoglobin: 11.2 g/dL — ABNORMAL LOW (ref 12.0–15.0)
MCH: 28.9 pg (ref 26.0–34.0)
MCHC: 31.2 g/dL (ref 30.0–36.0)
MCV: 92.5 fL (ref 80.0–100.0)
Platelets: 204 10*3/uL (ref 150–400)
RBC: 3.88 MIL/uL (ref 3.87–5.11)
RDW: 18.5 % — ABNORMAL HIGH (ref 11.5–15.5)
WBC: 6.3 10*3/uL (ref 4.0–10.5)
nRBC: 0 % (ref 0.0–0.2)

## 2019-01-18 LAB — GLUCOSE, CAPILLARY
Glucose-Capillary: 137 mg/dL — ABNORMAL HIGH (ref 70–99)
Glucose-Capillary: 131 mg/dL — ABNORMAL HIGH (ref 70–99)
Glucose-Capillary: 123 mg/dL — ABNORMAL HIGH (ref 70–99)
Glucose-Capillary: 91 mg/dL (ref 70–99)
Glucose-Capillary: 103 mg/dL — ABNORMAL HIGH (ref 70–99)

## 2019-01-18 LAB — TSH: TSH: 5.98 u[IU]/mL — ABNORMAL HIGH (ref 0.350–4.500)

## 2019-01-18 LAB — TROPONIN I (HIGH SENSITIVITY): Troponin I (High Sensitivity): 53 ng/L — ABNORMAL HIGH (ref <18)

## 2019-01-18 LAB — MRSA PCR SCREENING: MRSA by PCR: NEGATIVE

## 2019-01-18 LAB — SARS CORONAVIRUS 2 BY RT PCR (HOSPITAL ORDER, PERFORMED IN ~~LOC~~ HOSPITAL LAB): SARS Coronavirus 2: NEGATIVE

## 2019-01-18 MED ORDER — SODIUM CHLORIDE 0.9% FLUSH
3.0000 mL | Freq: Two times a day (BID) | INTRAVENOUS | Status: DC
  Administered 2019-01-18: 3 mL via INTRAVENOUS

## 2019-01-18 MED ORDER — ONDANSETRON HCL 4 MG/2ML IJ SOLN: 4.0000 mg | Freq: Four times a day (QID) | INTRAMUSCULAR | Status: DC | PRN

## 2019-01-18 MED ORDER — SODIUM CHLORIDE 0.9% FLUSH
3.0000 mL | Freq: Two times a day (BID) | INTRAVENOUS | Status: DC
  Administered 2019-01-18 – 2019-01-19 (×4): 3 mL via INTRAVENOUS

## 2019-01-18 MED ORDER — MELATONIN 3 MG PO TABS
6.0000 mg | ORAL_TABLET | Freq: Every day | ORAL | Status: DC
  Administered 2019-01-18 (×2): 6 mg via ORAL
  Filled 2019-01-18 (×3): qty 2

## 2019-01-18 MED ORDER — ALBUTEROL SULFATE (2.5 MG/3ML) 0.083% IN NEBU
3.0000 mL | INHALATION_SOLUTION | Freq: Four times a day (QID) | RESPIRATORY_TRACT | Status: DC | PRN
  Administered 2019-01-18: 3 mL via RESPIRATORY_TRACT
  Filled 2019-01-18: qty 3

## 2019-01-18 MED ORDER — HYDRALAZINE HCL 25 MG PO TABS
25.0000 mg | ORAL_TABLET | Freq: Three times a day (TID) | ORAL | Status: DC
  Administered 2019-01-18 – 2019-01-19 (×4): 25 mg via ORAL
  Filled 2019-01-18 (×4): qty 1

## 2019-01-18 MED ORDER — SODIUM CHLORIDE 0.9% FLUSH: 3.0000 mL | INTRAVENOUS | Status: DC | PRN

## 2019-01-18 MED ORDER — HEPARIN SODIUM (PORCINE) 1000 UNIT/ML IJ SOLN
INTRAMUSCULAR | Status: AC
  Filled 2019-01-18: qty 4

## 2019-01-18 MED ORDER — HYDRALAZINE HCL 20 MG/ML IJ SOLN
10.0000 mg | INTRAMUSCULAR | Status: DC
  Filled 2019-01-18: qty 1

## 2019-01-18 MED ORDER — INSULIN ASPART 100 UNIT/ML ~~LOC~~ SOLN
0.0000 [IU] | Freq: Three times a day (TID) | SUBCUTANEOUS | Status: DC
  Administered 2019-01-18 (×2): 1 [IU] via SUBCUTANEOUS
  Administered 2019-01-19: 12:00:00 3 [IU] via SUBCUTANEOUS

## 2019-01-18 MED ORDER — ATORVASTATIN CALCIUM 80 MG PO TABS
80.0000 mg | ORAL_TABLET | Freq: Every day | ORAL | Status: DC
  Administered 2019-01-18: 80 mg via ORAL
  Filled 2019-01-18: qty 1

## 2019-01-18 MED ORDER — ASPIRIN EC 81 MG PO TBEC
81.0000 mg | DELAYED_RELEASE_TABLET | Freq: Every day | ORAL | Status: DC
  Administered 2019-01-18 – 2019-01-19 (×2): 81 mg via ORAL
  Filled 2019-01-18 (×2): qty 1

## 2019-01-18 MED ORDER — CHLORHEXIDINE GLUCONATE CLOTH 2 % EX PADS
6.0000 | MEDICATED_PAD | Freq: Every day | CUTANEOUS | Status: DC
  Administered 2019-01-18 – 2019-01-19 (×2): 6 via TOPICAL

## 2019-01-18 MED ORDER — SODIUM CHLORIDE 0.9 % IV BOLUS: 1000.0000 mL | Freq: Once | INTRAVENOUS | Status: DC

## 2019-01-18 MED ORDER — CLOPIDOGREL BISULFATE 75 MG PO TABS
75.0000 mg | ORAL_TABLET | Freq: Every day | ORAL | Status: DC
  Administered 2019-01-18 – 2019-01-19 (×2): 75 mg via ORAL
  Filled 2019-01-18 (×2): qty 1

## 2019-01-18 MED ORDER — ISOSORBIDE MONONITRATE ER 30 MG PO TB24
30.0000 mg | ORAL_TABLET | Freq: Every day | ORAL | Status: DC
  Administered 2019-01-18 – 2019-01-19 (×2): 30 mg via ORAL
  Filled 2019-01-18 (×2): qty 1

## 2019-01-18 MED ORDER — ONDANSETRON HCL 4 MG PO TABS: 4.0000 mg | ORAL_TABLET | Freq: Four times a day (QID) | ORAL | Status: DC | PRN

## 2019-01-18 MED ORDER — ACETAMINOPHEN 325 MG PO TABS: 650.0000 mg | ORAL_TABLET | Freq: Four times a day (QID) | ORAL | Status: DC | PRN

## 2019-01-18 MED ORDER — LATANOPROST 0.005 % OP SOLN
1.0000 [drp] | Freq: Every day | OPHTHALMIC | Status: DC
  Administered 2019-01-18 (×2): 1 [drp] via OPHTHALMIC
  Filled 2019-01-18: qty 2.5

## 2019-01-18 MED ORDER — ACETAMINOPHEN 650 MG RE SUPP: 650.0000 mg | Freq: Four times a day (QID) | RECTAL | Status: DC | PRN

## 2019-01-18 MED ORDER — ALLOPURINOL 100 MG PO TABS
100.0000 mg | ORAL_TABLET | Freq: Two times a day (BID) | ORAL | Status: DC
  Administered 2019-01-18 – 2019-01-19 (×2): 100 mg via ORAL
  Filled 2019-01-18 (×3): qty 1

## 2019-01-18 MED ORDER — DOXERCALCIFEROL 4 MCG/2ML IV SOLN
1.0000 ug | INTRAVENOUS | Status: DC
  Filled 2019-01-18: qty 2

## 2019-01-18 MED ORDER — HEPARIN SODIUM (PORCINE) 5000 UNIT/ML IJ SOLN
5000.0000 [IU] | Freq: Three times a day (TID) | INTRAMUSCULAR | Status: DC
  Administered 2019-01-18 – 2019-01-19 (×3): 5000 [IU] via SUBCUTANEOUS
  Filled 2019-01-18 (×2): qty 1

## 2019-01-18 MED ORDER — SODIUM CHLORIDE 0.9 % IV SOLN
250.0000 mL | INTRAVENOUS | Status: DC | PRN
  Administered 2019-01-18 – 2019-01-19 (×3): 250 mL via INTRAVENOUS

## 2019-01-18 MED ORDER — SODIUM CHLORIDE 0.9 % IV SOLN
1.0000 g | INTRAVENOUS | Status: DC
  Administered 2019-01-18 – 2019-01-19 (×2): 1 g via INTRAVENOUS
  Filled 2019-01-18 (×2): qty 10

## 2019-01-18 MED ORDER — INSULIN ASPART 100 UNIT/ML ~~LOC~~ SOLN: 0.0000 [IU] | Freq: Every day | SUBCUTANEOUS | Status: DC

## 2019-01-18 NOTE — Progress Notes (Signed)
Called to see Monica [name] to arrange dialysis.  She tells me she was feeling poorly yesterday but did not know why.  Was found to have a low HR and that was assumed to be it.  She feels better but her HR is still low.  Is admitted for observation off of beta blocker before any other intervention is suggested.  Today is her HD day.  As she is obs, will not do full consult. K was 5.6 but most recent was 4.8  Was able to touch base with her OP HD unit- [facility].  Orders as follows  [facility] - TTS 3.5 hours, 350 BFR-  Using [facility] for now until getting permission from surgeon for her AVF EDW 162 pounds- per pt cannot take off more than 5 pounds 180 dialyzer, 3 K, no heparin, 1 of hectorol per tx, ferrlecit  62.5 weekly   Louis Meckel

## 2019-01-18 NOTE — Patient Outreach (Signed)
[facility]) Care Management  01/18/2019  Liticia Gasior 1930/10/22 [id number]   Patient noted to be admitted to [facility] for decreased heart rate.  CM notified hospital liaison of admission.  Will continue to monitor progress.    Jone Baseman, RN, MSN [facility] Management Care Management Coordinator Direct Line [phone] Cell [phone] Toll Free: [phone]  Fax: [phone]

## 2019-01-18 NOTE — ED Notes (Signed)
Report to [facility] on arrival.

## 2019-01-18 NOTE — Progress Notes (Signed)
Pt SBP 190's, hr 90 on telemetry, paged Dr Reesa Chew, repeat and if still elevated then give hydralazine 10mg  x1 iv

## 2019-01-18 NOTE — Progress Notes (Signed)
Patient arrived to unit, no complaints. Cardiac telemetry initiated and verified. Patient allowed to contact daughter, [name], via facetime. MD paged to notify of patients arrival.

## 2019-01-18 NOTE — Progress Notes (Signed)
After several phone calls with family and ED security, pt's daughter allowed into patient's room as safety precaution (family 1:1 sitter). Pt with hx of dementia. Pt confused at times and tries to get out of bed alone. Pt was on this floor last week in room 3e02, requiring her daughter to be called for safety concerns/confusion. Will continue to monitor.

## 2019-01-18 NOTE — Progress Notes (Signed)
Daughter, [name] was waiting in ED. Daughter is not at bedside because she says patient gets confused at night. Patient is now A&O x4. Patient was here last week and was confused and refusing care.

## 2019-01-18 NOTE — Consult Note (Signed)
   [facility] CM Inpatient Consult   01/18/2019  Monica [name] [birth year] [id number]  Patient is currently active with [facility] Management for chronic disease management services.  Patient has been engaged by a [facility] Management Coordinator for transition of care follow up. Our community based plan of care has focused on disease management and community resource support.    Chart review of patient's HPI:  Monica [name] is a 85 y.o. female with medical history significant for dementia, coronary artery disease with an STEMI a week ago treated with stents, ESRD on hemodialysis, insulin-dependent diabetes mellitus, and hypertension, now presenting to the emergency department for evaluation of bradycardia.  Patient was discharged from the hospital 1 week ago after she was treated for NSTEMI with DES to SVG-OM and SVG-RI, now presenting to the emergency department for evaluation of bradycardia that was noted during her follow-up appointment.  Patient reportedly completed dialysis on 01/16/2019, may have had some mild dyspnea, and has been experiencing dysuria, but had overall been doing well since leaving the hospital.  She saw her PCP today for discharge follow-up appointment, was noted to be bradycardic, plan was initially to reduce her Toprol by 50%, but with heart rate in the 40s, she was directed to the ED for further evaluation.  She reports occasional mild lightheadedness but no headache or chest pain, and no fever, chills, or cough.  Chart noted also patient is having her AD and HCPOA documents started today.  Patient will receive a post hospital call and will be evaluated for assessments and disease process education.    Plan: Follow for disposition needs and update referral needs in [facility] Management.  Notify and follow up with  Inpatient [facility] team member to make aware that [facility] Management following.   Of note, [facility] Care Management services does not replace or interfere with any services  that are needed or arranged by inpatient [facility] care management team.  For additional questions or referrals please contact:  Natividad Brood, RN BSN [facility] Liaison  [phone] business mobile phone Toll free office [phone]  Fax number: [phone] [email or web address]

## 2019-01-18 NOTE — Progress Notes (Signed)
Holding meds per HD procotocol, awaiting nephrology to place orders after seeing pt

## 2019-01-18 NOTE — H&P (Signed)
History and Physical    Monica [name] QQP:[id number] DOB: 1930-12-27 DOA: 01/17/2019  PCP: Benito Mccreedy, MD   Patient coming from: Home    Chief Complaint: Slow heart rate   HPI: Monica [name] is a 85 y.o. female with medical history significant for dementia, coronary artery disease with an STEMI a week ago treated with stents, ESRD on hemodialysis, insulin-dependent diabetes mellitus, and hypertension, now presenting to the emergency department for evaluation of bradycardia.  Patient was discharged from the hospital 1 week ago after she was treated for NSTEMI with DES to SVG-OM and SVG-RI, now presenting to the emergency department for evaluation of bradycardia that was noted during her follow-up appointment.  Patient reportedly completed dialysis on 01/16/2019, may have had some mild dyspnea, and has been experiencing dysuria, but had overall been doing well since leaving the hospital.  She saw her PCP today for discharge follow-up appointment, was noted to be bradycardic, plan was initially to reduce her Toprol by 50%, but with heart rate in the 40s, she was directed to the ED for further evaluation.  She reports occasional mild lightheadedness but no headache or chest pain, and no fever, chills, or cough.   [facility] ED Course: Upon arrival to the ED, patient is found to be afebrile, saturating well on room air, bradycardic in the 40s, and with stable blood pressure.  EKG features a junctional rhythm with rate 46, LVH, and repolarization abnormality.  Chest x-ray is notable for cardiomegaly with no acute findings.  Chemistry panel features a sodium of 133, potassium 5.6, slight elevation in transaminases, normal bicarbonate, and BUN 45.  Troponin is slightly elevated and BNP elevated to 1643.  Urinalysis is nitrite positive.  Cardiology was consulted by the ED physician and it was recommended that patient be observed on the medical service and her beta-blocker be held.  Review of  Systems:  All other systems reviewed and apart from HPI, are negative.  Past Medical History:  Diagnosis Date   Asthma    Bilateral pleural effusion    a. s/p CABG >> s/p bilat thoracentesis 11/2015   Candida infection 10/2015   Bilateral breasts   Chronic diastolic CHF    Echo 71/24 [facility]):  EF 55-65, normal wall motion, normal diastolic function   Coronary artery disease    LHC 3/17 >> 3 v CAD >> CABG // MV 01/2018:  Abnormal, low risk stress nuclear study with significant breast attenuation; cannot R/O prior infarct; no ischemia; EF 56 with normal wall motion. // s/p NSTEMI 11/19 [facility]) >> LHC:  dLM 90, pLAD 100, pLCx 100, mRCA 100, L-LAD ok, S-Dx prox 70, S-OM dist 99 (ulc); S-PDA 100 >>PCI: 2.25 x 24 mm Synergy DES to S-OM //  EF normal by Echo 11/19   ESRD (end stage renal disease)    Dialysis Tu, Th, Sa   Essential hypertension    Gastroesophageal reflux disease    History of blood transfusion    History of echocardiogram    a. Echo 3/17: Mild focal basal septal hypertrophy, vigorous LVF, EF 65-70%, normal wall motion, grade 1 diastolic dysfunction, MAC   Hyperlipidemia    Long Term Cardiac Monitor 3-14 day    LT Monitor 09/2018: SR, Sinus Tach, 6 beats NSVT, 38 runs of SVT (brief); no AFib.   Mass    Peptic ulcer    Protein calorie malnutrition ([place])    Type 2 diabetes mellitus ([place])     Past Surgical  History:  Procedure Laterality Date   ABDOMINAL HYSTERECTOMY     CARDIAC CATHETERIZATION N/A 09/17/2015   Procedure: Left Heart Cath and Coronary Angiography;  Surgeon: Jettie Booze, MD;  Location: [place] [facility];  Service: Cardiovascular;  Laterality: N/A;   COLON SURGERY     CORONARY ARTERY BYPASS GRAFT N/A 09/19/2015   Procedure: CORONARY ARTERY BYPASS GRAFTING (CABG) TIMES FOUR USING BILATARAL SAPHENOUS VEIN GRAFTS AND LEFT INTERNAL MAMMARY ARTERY;  Surgeon: [name], MD;  Location: [place];  Service: Open Heart  Surgery;  Laterality: N/A;   CORONARY STENT INTERVENTION N/A 01/10/2019   Procedure: CORONARY STENT INTERVENTION;  Surgeon: Leonie Man, MD;  Location: [place] [facility];  Service: Cardiovascular;  Laterality: N/A;   LEFT HEART CATH AND CORS/GRAFTS ANGIOGRAPHY N/A 01/10/2019   Procedure: LEFT HEART CATH AND CORS/GRAFTS ANGIOGRAPHY;  Surgeon: Leonie Man, MD;  Location: [place] [facility];  Service: Cardiovascular;  Laterality: N/A;   TEE WITHOUT CARDIOVERSION N/A 09/19/2015   Procedure: TRANSESOPHAGEAL ECHOCARDIOGRAM (TEE);  Surgeon: [name], MD;  Location: [place];  Service: Open Heart Surgery;  Laterality: N/A;     reports that she has never smoked. She has quit using smokeless tobacco.  Her smokeless tobacco use included snuff. She reports that she does not drink alcohol or use drugs.  No Known Allergies  Family History  Problem Relation Age of Onset   Heart attack Father      Prior to Admission medications   Medication Sig Start Date End Date Taking? Authorizing Provider  ciprofloxacin (CIPRO) 250 MG tablet Take 250 mg by mouth 2 (two) times daily. 01/17/19 01/20/19 Yes [provider]  acetaminophen (TYLENOL) 500 MG tablet Take 1,000 mg by mouth every 6 (six) hours as needed for mild pain or headache.    [provider]  albuterol (PROVENTIL HFA;VENTOLIN HFA) 108 (90 Base) MCG/ACT inhaler Inhale 2 puffs into the lungs every 6 (six) hours as needed for wheezing or shortness of breath.    [provider]  allopurinol (ZYLOPRIM) 100 MG tablet Take 100 mg by mouth 2 (two) times daily.     [provider]  aspirin EC 81 MG EC tablet Take 1 tablet (81 mg total) by mouth daily. 10/03/15   Barrett, Erin R, PA-C  atorvastatin (LIPITOR) 80 MG tablet Take 80 mg by mouth daily.  07/12/18   [provider]  Cholecalciferol (VITAMIN D-3) 1000 units CAPS Take 1,000 Units by mouth daily.     [provider]  clopidogrel (PLAVIX) 75  MG tablet Take 75 mg by mouth daily.     [provider]  diphenhydrAMINE (BENADRYL) 25 MG tablet Take 25 mg by mouth as needed for itching or allergies.     [provider]  ferric citrate (AURYXIA) 1 GM 210 MG(Fe) tablet Take 210 mg by mouth 3 (three) times daily with meals.     [provider]  hydrALAZINE (APRESOLINE) 25 MG tablet Take 1 tablet (25 mg total) by mouth 3 (three) times daily. 01/11/19 01/11/20  Donne Hazel, MD  insulin glargine (LANTUS) 100 UNIT/ML injection Inject 10 Units into the skin at bedtime.     [provider]  isosorbide mononitrate (IMDUR) 30 MG 24 hr tablet Take 30 mg by mouth daily.    [provider]  latanoprost (XALATAN) 0.005 % ophthalmic solution Place 1 drop into both eyes at bedtime. 07/05/18   [provider]  loratadine (CLARITIN) 10 MG  tablet Take 10 mg by mouth as needed for allergies.     [provider]  losartan (COZAAR) 25 MG tablet Take 1 tablet (25 mg total) by mouth daily. 01/12/19 02/11/19  Donne Hazel, MD  Melatonin 5 MG CAPS Take 5 mg by mouth at bedtime.     [provider]  metoprolol succinate (TOPROL-XL) 25 MG 24 hr tablet Take 1 tablet (25 mg total) by mouth daily. Patient taking differently: Take 25 mg by mouth daily. 1/2 tab 01/12/19 02/11/19  Donne Hazel, MD  multivitamin (RENA-VIT) TABS tablet Take 1 tablet by mouth daily.    [provider]  nitroGLYCERIN (NITROSTAT) 0.4 MG SL tablet PLACE 1 TABLET BY MOUTH UNDER THE TONGUE EVERY 5 MINUTES AS NEEDED FOR CHEST PAIN. FOR UP TO 3 DOSES. IF NO RELIEF CALL 911 Patient taking differently: Place 0.4 mg under the tongue every 5 (five) minutes as needed for chest pain. FOR UP TO 3 DOSES. IF NO RELIEF CALL 911 07/21/18   Dorothy Spark, MD    Physical Exam: Vitals:   01/17/19 2230 01/17/19 2330 01/18/19 0128 01/18/19 0142  BP: (!) 138/54 (!) 132/59 (!) 139/48   Pulse: (!) 46 (!) 45 (!) 47   Resp: _0 Temp:   98.8 F (37.1 C)   TempSrc:   Oral   SpO2: 99% 99% 100%   Weight:    74.8 kg  Height:    _1  (1.575 m)    Constitutional: NAD, calm  Eyes: PERTLA, lids and conjunctivae normal ENMT: Mucous membranes are moist. Posterior pharynx clear of any exudate or lesions.   Neck: normal, supple, no masses, no thyromegaly Respiratory: no wheezing, no crackles. Normal respiratory effort. No accessory muscle use.  Cardiovascular: S1 & S2 heard, regular rate and rhythm. No extremity edema.   Abdomen: No distension, no tenderness, soft. Bowel sounds normal.  Musculoskeletal: no clubbing / cyanosis. No joint deformity upper and lower extremities.    Skin: no significant rashes, lesions, ulcers. Warm, dry, well-perfused. Neurologic: CN 2-12 grossly intact. Sensation intact. Strength 5/5 in all 4 limbs.  Psychiatric: Alert and oriented to person, place, and situation. Pleasant, cooperative.    Labs on Admission: I have personally reviewed following labs and imaging studies  CBC: Recent Labs  Lab 01/11/19 0234 01/17/19 2133  WBC 5.5 5.8  NEUTROABS  --  3.2  HGB 12.1 11.8*  HCT 36.8 39.5  MCV 88.7 94.5  PLT 180 789   Basic Metabolic Panel: Recent Labs  Lab 01/11/19 0234 01/17/19 2133  NA 134* 133*  K 4.4 5.6*  CL 98 97*  CO2 22 25  GLUCOSE 176* 148*  BUN 48* 45*  CREATININE 5.11* 6.67*  CALCIUM 8.4* 8.5*   GFR: Estimated Creatinine Clearance: 5.7 mL/min (A) (by C-G formula based on SCr of 6.67 mg/dL (H)). Liver Function Tests: Recent Labs  Lab 01/17/19 2133  AST 48*  ALT 46*  ALKPHOS 68  BILITOT 0.7  PROT 7.2  ALBUMIN 3.7   No results for input(s): LIPASE, AMYLASE in the last 168 hours. No results for input(s): AMMONIA in the last 168 hours. Coagulation Profile: No results for input(s): INR, PROTIME in the last 168 hours. Cardiac Enzymes: No results for input(s): CKTOTAL, CKMB, CKMBINDEX, TROPONINI in the last 168 hours. BNP (last 3 results) No results for  input(s): PROBNP in the last 8760 hours. HbA1C: No results for input(s): HGBA1C in the last 72 hours. CBG: Recent  Labs  Lab 01/11/19 0607 01/11/19 1100 01/18/19 0121  GLUCAP 141* 99 137*   Lipid Profile: No results for input(s): CHOL, HDL, LDLCALC, TRIG, CHOLHDL, LDLDIRECT in the last 72 hours. Thyroid Function Tests: No results for input(s): TSH, T4TOTAL, FREET4, T3FREE, THYROIDAB in the last 72 hours. Anemia Panel: No results for input(s): VITAMINB12, FOLATE, FERRITIN, TIBC, IRON, RETICCTPCT in the last 72 hours. Urine analysis:    Component Value Date/Time   COLORURINE YELLOW 01/17/2019 1920   APPEARANCEUR CLOUDY (A) 01/17/2019 1920   APPEARANCEUR Hazy 07/09/2012 0108   LABSPEC 1.010 01/17/2019 1920   LABSPEC 1.010 07/09/2012 0108   PHURINE 7.5 01/17/2019 1920   GLUCOSEU NEGATIVE 01/17/2019 1920   GLUCOSEU >=500 07/09/2012 0108   HGBUR MODERATE (A) 01/17/2019 1920   BILIRUBINUR NEGATIVE 01/17/2019 1920   BILIRUBINUR Negative 07/09/2012 0108   KETONESUR NEGATIVE 01/17/2019 1920   PROTEINUR 100 (A) 01/17/2019 1920   UROBILINOGEN 0.2 11/22/2012 1809   NITRITE POSITIVE (A) 01/17/2019 1920   LEUKOCYTESUR LARGE (A) 01/17/2019 1920   LEUKOCYTESUR 1+ 07/09/2012 0108   Sepsis Labs: _0 (procalcitonin:4,lacticidven:4) ) Recent Results (from the past 240 hour(s))  MRSA PCR Screening     Status: None   Collection Time: 01/08/19  2:56 AM   Specimen: Nasal Mucosa; Nasopharyngeal  Result Value Ref Range Status   MRSA by PCR NEGATIVE NEGATIVE Final    Comment:        The GeneXpert MRSA Assay (FDA approved for NASAL specimens only), is one component of a comprehensive MRSA colonization surveillance program. It is not intended to diagnose MRSA infection nor to guide or monitor treatment for MRSA infections. Performed at [facility], [place] [address] [place], [place] [address]   SARS Coronavirus 2 (Hosp order,Performed in [facility] lab via [employer] ID)      Status: None   Collection Time: 01/17/19 10:48 PM   Specimen: Dry Nasal Swab ([employer] ID Now)  Result Value Ref Range Status   SARS Coronavirus 2 (A[employer] ID Now) NEGATIVE NEGATIVE Final    Comment: (NOTE) SARS-CoV-2 target nucleic acids are NOT DETECTED. The SARS-CoV-2 RNA is generally detectable in upper and lower respiratory specimens during the acute phase of infection.  Negativeresults do not preclude SARS-CoV-2 infection, do not rule out coinfections with other pathogens, and should not be used as the  sole basis for treatment or other patient management decisions.  Negative results must be combined with clinical observations, patient history, and epidemiological information. The expected result is Negative. Fact Sheet for Patients: GolfingFamily.no Fact Sheet for Healthcare Providers: https://www.hernandez-brewer.com/ This test is not yet approved or cleared by the [place] FDA and  has been authorized for detection and/or diagnosis of SARS-CoV-2 by FDA under an Emergency Use Authorization (EUA).  This EUA will remain in effect (meaning this test can be used) for the duration of  the COVID19 declaration under Section 5 64(b)(1) of the Act, 21 U.S.C.  section [phone] 3(b)(1), unless the authorization is terminated or revoked sooner. Performed at [facility], [address] [place], [place] [address]      Radiological Exams on Admission: Dg Chest [facility] 1 View  Result Date: 01/17/2019 CLINICAL DATA:  Shortness of breath EXAM: PORTABLE CHEST 1 VIEW COMPARISON:  01/07/2019 FINDINGS: Right dialysis catheter remain in place, unchanged. Prior CABG. Cardiomegaly. No confluent opacity, effusion or edema. No acute bony abnormality. IMPRESSION: Cardiomegaly.  No active disease. Electronically Signed   By: Rolm Baptise M.D.   On: 01/17/2019  21:19    EKG: Independently reviewed. Junctional bradycardia, rate 46, LVH with repolarization  abnormality.   Assessment/Plan   1. Bradycardia  - Presents to ED for evaluation of bradycardia at PCP office, as low as mid-40's  - The patient appears to be asymptomatic and there is no chest pain to suggest an ischemic etiology  - She has an acute UTI and infection could be contributing; she was started on Toprol during recent admission  - Hold beta-blocker, treat UTI, continue cardiac monitoring, repeat EKG in am    2. CAD  - No angina since recent hospital discharge  - Troponin is mildly elevated and increasing, will trend - Continue DAPT, Lipitor, and Imdur; hold beta-blocker and ARB initially as above   3. ESRD; hyperkalemia  - Completed HD on 01/16/19  - Potassium is 5.6 in ED with normal bicarb, BUN 45, and no significant HTN or hypervolemia  - SLIV, renally-dose medications, hold ARB in light of hyperkalemia, consult with nephrology for maintenance HD    4. UTI  - Patient complains of dysuria, was started on ciprofloxacin 7/13 and took one dose, and UA is noted to be nitrite-positive  - Send urine for culture, continue treatment with Rocephin pending clinical course and culture data  5. Insulin-dependent DM  - A1c was 7.0% in July 2020  - Continue CBG-monitoring, insulin   PPE: Mask, face shield  DVT prophylaxis: sq heparin  Code Status: Full  Family Communication: Daughter updated at bedside Consults called: Cardiology consulted by ED physician  Admission status: Observation     Vianne Bulls, MD [facility] Pager [phone]  If 7PM-7AM, please contact night-coverage [email or web address] Password [email or web address]  01/18/2019, 1:58 AM

## 2019-01-18 NOTE — Plan of Care (Signed)

## 2019-01-18 NOTE — Progress Notes (Signed)
Patient seen and examined at bedside.  Daughter at bedside.  Patient was admitted overnight for symptomatic bradycardia.  Patient was just here about a week ago and had NSTEMI.  Underwent cardiac catheterization requiring drug-eluting stent.  Her short acting Lopressor was converted to long-acting Toprol-XL.  After going home patient reported of feeling dizzy, weak and fatigued.  She was seen by primary care physician yesterday and was noted to be bradycardic with heart rate in 40s therefore sent to the ER.  This morning her heart rate remains anywhere from 40-50.  She still having some fatigue and dizziness especially when she gets up otherwise no other complaints.  She denies any chest pain, shortness of breath.  On physical exam her vital signs are normal.  Clear to auscultation bilaterally, sinus rhythm with heart rate in 40s.  No lower extremity swelling noted.  Telemetry reviewed  For symptomatic bradycardia will hold off on beta-blocker.  Cardiology team consulted, awaiting their input. Urinary tract infection-cultures have been sent.  Currently on Rocephin. ESRD hemodialysis TTS-follows with nephrology in [facility].  Nephrology team consulted for dialysis today.  Hopefully after dialysis bradycardia will improve as well.  Please call with further questions as needed.  Discussed her case with patient's RN. Her daughter is at the bedside as well.  Gerlean Ren MD [facility]

## 2019-01-18 NOTE — Progress Notes (Signed)
   01/18/19 1209  Clinical Encounter Type  Visited With Patient and family together  Visit Type Initial;Other (Comment) (AD)  Referral From Nurse  Consult/Referral To Chaplain  This chaplain was pastorally present with Pt. and Pt. daughter-Monica [name].  The Pt. listened to AD education and is ready to document Monica [name] as HCPOA.  The Pt. is taking more time to think through the Living Will.  The chaplain left the document with the Pt.  The Pt. and Monica [name] will contact spiritual care when the Pt. is ready to complete the AD. The chaplain is available for F/U spiritual care as needed.

## 2019-01-18 NOTE — Consult Note (Addendum)
The patient has been seen in conjunction with Cadence Furth, PA-C. All aspects of care have been considered and discussed. The patient has been personally interviewed, examined, and all clinical data has been reviewed.   Sinus node dysfunction, aggravated by beta blocker therapy. Beta-blocker has been discontinued. Hopefully, no SVT will occur.  No further recommendations unless other problems.  No indication for pacing.  Elevated  Hs Trop I is not clinically relevant as no suspicion of ACS. Likely related to CKD.  Will follow in AM.   Cardiology Consultation:   Patient ID: Monica [name] MRN: [id number]; DOB: Nov 19, 1930  Admit date: 01/17/2019 Date of Consult: 01/18/2019  Primary Care Provider: Benito Mccreedy, MD Primary Cardiologist: Ena Dawley, MD  Primary Electrophysiologist:  None    Patient Profile:   Monica [name] is a 85 y.o. female with a hx of dementia, coronary artery disease (CABG 09/2015 and a STEMI a week ago treated with stent x 2), ESRD on hemodialysis, insulin-dependent diabetes mellitus, and hypertension  who is being seen today for the evaluation of shortness of breath at the request of Dr. Reesa Chew.  History of Present Illness:   Ms. Reber was recently admitted 7/3 - 7/7 for NSTEMI. Cath 01/10/19 showed Severe native vessel CAD, occluded/atretic SVG-dRCA, Severe proximal to stent and in-stent restenosis in SVG-OM with successful PTCA followed by DES PCI with overlapping DES stent, Severe ostial-proximal SVG-RI, Successful DES PCI, Normal LVEDP. Echo showed newly reduced LVEF 30-35%, moderately dilated right and left atrium, moderate MR, mild AR. Patient had been discharged with Toprol-XL 16m QD long-acting in place of short acting Lopressor 260mBID. Since discharge she was feeling general fatigue, sob, and some dizziness.   Patient completed dialysis on 01/16/2019, has had some mild dyspnea, and has been experiencing dysuria, but had overall been doing well.   Patient was following up with her PCP yesterday 7/13 for sob over the last couple days. She also was having occasional mild lightheadedness. PCP was concerned the patient had low heart rate in the 40s and sent the patient to the ER. Patient denied chest pain, syncope, or presyncope. In the ER HR was initially elevated 103 which was attributed to the patient just taking her albuterol inhaler. RR 22, B/P 163/83, O2 100%. Sodium 134, K 5.6, Glucose 148. BNP 1643.2. HS trop 34>39>53. HMG 11.2. TSH 5.980. COVID negative. the EKG showed HR 48 with a junctional rhythm. CXR did not show signifigant pulmonary edema. Patients last HD was Saturday, schedule TTS. BB and Arb were held and patient was admitted for further work-up  Patient was seen tody during HD. She says she is feeling better than yesterday. SOB improved. Denies chest pain.    Heart Pathway Score:     Past Medical History:  Diagnosis Date  . Asthma   . Bilateral pleural effusion    a. s/p CABG >> s/p bilat thoracentesis 11/2015  . Candida infection 10/2015   Bilateral breasts  . Chronic diastolic CHF    [facility]  EF 55-65, normal wall motion, normal diastolic function  . Coronary artery disease    LHC 3/17 >> 3 v CAD >> CABG // MV 01/2018:  Abnormal, low risk stress nuclear study with significant breast attenuation; cannot R/O prior infarct; no ischemia; EF 56 with normal wall motion. // [facility]  dLM 90, pLAD 100, pLCx 100, mRCA 100, L-LAD ok, S-Dx prox 70, S-OM dist 99 (ulc); S-PDA 100 >>PCI: 2.25  x 24 mm Synergy DES to S-OM //  EF normal by Echo 11/19  . ESRD (end stage renal disease)    Dialysis Tu, Th, Sa  . Essential hypertension   . Gastroesophageal reflux disease   . History of blood transfusion   . History of echocardiogram    a. Echo 3/17: Mild focal basal septal hypertrophy, vigorous LVF, EF 65-70%, normal wall motion, grade 1 diastolic dysfunction, MAC  . Hyperlipidemia   .  Long Term Cardiac Monitor 3-14 day    LT Monitor 09/2018: SR, Sinus Tach, 6 beats NSVT, 38 runs of SVT (brief); no AFib.  . Mass   . Peptic ulcer   . Protein calorie malnutrition ([place])   . Type 2 diabetes mellitus ([place])     Past Surgical History:  Procedure Laterality Date  . ABDOMINAL HYSTERECTOMY    . CARDIAC CATHETERIZATION N/A 09/17/2015   Procedure: Left Heart Cath and Coronary Angiography;  Surgeon: Jettie Booze, MD;  Location: [place] CV LAB;  Service: Cardiovascular;  Laterality: N/A;  . COLON SURGERY    . CORONARY ARTERY BYPASS GRAFT N/A 09/19/2015   Procedure: CORONARY ARTERY BYPASS GRAFTING (CABG) TIMES FOUR USING BILATARAL SAPHENOUS VEIN GRAFTS AND LEFT INTERNAL MAMMARY ARTERY;  Surgeon: [name], MD;  Location: [place];  Service: Open Heart Surgery;  Laterality: N/A;  . CORONARY STENT INTERVENTION N/A 01/10/2019   Procedure: CORONARY STENT INTERVENTION;  Surgeon: Leonie Man, MD;  Location: [place] CV LAB;  Service: Cardiovascular;  Laterality: N/A;  . LEFT HEART CATH AND CORS/GRAFTS ANGIOGRAPHY N/A 01/10/2019   Procedure: LEFT HEART CATH AND CORS/GRAFTS ANGIOGRAPHY;  Surgeon: Leonie Man, MD;  Location: [place] CV LAB;  Service: Cardiovascular;  Laterality: N/A;  . TEE WITHOUT CARDIOVERSION N/A 09/19/2015   Procedure: TRANSESOPHAGEAL ECHOCARDIOGRAM (TEE);  Surgeon: [name], MD;  Location: [place];  Service: Open Heart Surgery;  Laterality: N/A;     Home Medications:  Prior to Admission medications   Medication Sig Start Date End Date Taking? Authorizing Provider  ciprofloxacin (CIPRO) 250 MG tablet Take 250 mg by mouth 2 (two) times daily. 01/17/19 01/20/19 Yes [provider]  acetaminophen (TYLENOL) 500 MG tablet Take 1,000 mg by mouth every 6 (six) hours as needed for mild pain or headache.    [provider]  albuterol (PROVENTIL HFA;VENTOLIN HFA) 108 (90 Base) MCG/ACT inhaler Inhale 2 puffs into the lungs every 6  (six) hours as needed for wheezing or shortness of breath.    [provider]  allopurinol (ZYLOPRIM) 100 MG tablet Take 100 mg by mouth 2 (two) times daily.     [provider]  aspirin EC 81 MG EC tablet Take 1 tablet (81 mg total) by mouth daily. 10/03/15   Barrett, Erin R, PA-C  atorvastatin (LIPITOR) 80 MG tablet Take 80 mg by mouth daily.  07/12/18   [provider]  Cholecalciferol (VITAMIN D-3) 1000 units CAPS Take 1,000 Units by mouth daily.     [provider]  clopidogrel (PLAVIX) 75 MG tablet Take 75 mg by mouth daily.     [provider]  diphenhydrAMINE (BENADRYL) 25 MG tablet Take 25 mg by mouth as needed for itching or allergies.     [provider]  ferric citrate (AURYXIA) 1 GM 210 MG(Fe) tablet Take 210 mg by mouth 3 (three) times daily with meals.     [provider]  hydrALAZINE (APRESOLINE) 25 MG tablet Take 1 tablet (25  mg total) by mouth 3 (three) times daily. 01/11/19 01/11/20  Donne Hazel, MD  insulin glargine (LANTUS) 100 UNIT/ML injection Inject 10 Units into the skin at bedtime.     [provider]  isosorbide mononitrate (IMDUR) 30 MG 24 hr tablet Take 30 mg by mouth daily.    [provider]  latanoprost (XALATAN) 0.005 % ophthalmic solution Place 1 drop into both eyes at bedtime. 07/05/18   [provider]  loratadine (CLARITIN) 10 MG tablet Take 10 mg by mouth as needed for allergies.     [provider]  losartan (COZAAR) 25 MG tablet Take 1 tablet (25 mg total) by mouth daily. 01/12/19 02/11/19  Donne Hazel, MD  Melatonin 5 MG CAPS Take 5 mg by mouth at bedtime.     [provider]  metoprolol succinate (TOPROL-XL) 25 MG 24 hr tablet Take 1 tablet (25 mg total) by mouth daily. Patient taking differently: Take 25 mg by mouth daily. 1/2 tab 01/12/19 02/11/19  Donne Hazel, MD  multivitamin (RENA-VIT) TABS tablet Take 1 tablet by mouth daily.    [provider]  nitroGLYCERIN (NITROSTAT) 0.4 MG SL tablet PLACE 1 TABLET BY MOUTH UNDER THE TONGUE EVERY 5 MINUTES AS NEEDED FOR CHEST PAIN. FOR UP TO 3 DOSES. IF NO RELIEF CALL 911 Patient taking differently: Place 0.4 mg under the tongue every 5 (five) minutes as needed for chest pain. FOR UP TO 3 DOSES. IF NO RELIEF CALL 911 07/21/18   Dorothy Spark, MD    Inpatient Medications: Scheduled Meds: . allopurinol  100 mg Oral BID  . aspirin EC  81 mg Oral Daily  . atorvastatin  80 mg Oral q1800  . Chlorhexidine Gluconate Cloth  6 each Topical Q0600  . clopidogrel  75 mg Oral Daily  . doxercalciferol  1 mcg Intravenous Q T,Th,Sa-HD  . heparin  5,000 Units Subcutaneous Q8H  . hydrALAZINE  25 mg Oral TID  . insulin aspart  0-5 Units Subcutaneous QHS  . insulin aspart  0-9 Units Subcutaneous TID WC  . isosorbide mononitrate  30 mg Oral Daily  . latanoprost  1 drop Both Eyes QHS  . Melatonin  6 mg Oral QHS  . sodium chloride flush  3 mL Intravenous Q12H  . sodium chloride flush  3 mL Intravenous Q12H   Continuous Infusions: . sodium chloride 250 mL (01/18/19 0520)  . cefTRIAXone (ROCEPHIN)  IV Stopped (01/18/19 0818)   PRN Meds: sodium chloride, acetaminophen **OR** acetaminophen, albuterol, ondansetron **OR** ondansetron (ZOFRAN) IV, sodium chloride flush  Allergies:   No Known Allergies  Social History:   Social History   Socioeconomic History  . Marital status: Widowed    Spouse name: Not on file  . Number of children: Not on file  . Years of education: Not on file  . Highest education level: Not on file  Occupational History  . Occupation: Retired  Scientific laboratory technician  . Financial resource strain: Not hard at all  . Food insecurity    Worry: Never true    Inability: Never true  . Transportation needs    Medical: No    Non-medical: No  Tobacco Use  . Smoking status: Never Smoker  . Smokeless tobacco: Former Systems developer    Types: Snuff  Substance and Sexual Activity  .  Alcohol use: No    Alcohol/week: 0.0 standard drinks  . Drug use: No  . Sexual activity: Not on file  Lifestyle  . Physical  activity    Days per week: Not on file    Minutes per session: Not on file  . Stress: Not on file  Relationships  . Social connections    Talks on phone: More than three times a week    Gets together: More than three times a week    Attends religious service: More than 4 times per year    Active member of club or organization: No    Attends meetings of clubs or organizations: Never    Relationship status: Widowed  . Intimate partner violence    Fear of current or ex partner: Not on file    Emotionally abused: Not on file    Physically abused: Not on file    Forced sexual activity: Not on file  Other Topics Concern  . Not on file  Social History Narrative  . Not on file    Family History:    Family History  Problem Relation Age of Onset  . Heart attack Father      ROS:  Please see the history of present illness.  All other ROS reviewed and negative.     Physical Exam/Data:   Vitals:   01/18/19 0550 01/18/19 0749 01/18/19 1110 01/18/19 1330  BP: (!) 153/43 (!) 141/59 (!) 133/55 (!) 129/51  Pulse: (!) 43 (!) 47 (!) 34 (!) 44  Resp: _0 Temp: 98.1 F (36.7 C) (!) 97.5 F (36.4 C) 98.6 F (37 C) 98.5 F (36.9 C)  TempSrc: Oral Oral Oral Oral  SpO2: 100% 100% 100% 100%  Weight:    77.8 kg  Height:        Intake/Output Summary (Last 24 hours) at 01/18/2019 1417 Last data filed at 01/18/2019 0818 Gross per 24 hour  Intake 240 ml  Output 200 ml  Net 40 ml   Last 3 Weights 01/18/2019 01/18/2019 01/18/2019  Weight (lbs) 171 lb 8.3 oz 164 lb 11.2 oz 165 lb  Weight (kg) 77.8 kg 74.707 kg 74.844 kg     Body mass index is 31.37 kg/m.  General:  Well nourished, well developed AAF, in no acute distress HEENT: normal Neck: no JVD Endocrine:  No thryomegaly Vascular: No carotid bruits; FA pulses 2+ bilaterally without bruits  Cardiac:   normal S1, S2; RRR; no murmur  Lungs:  clear to auscultation bilaterally, no wheezing, rhonchi or rales  Abd: soft, nontender, no hepatomegaly  Ext: no edema Musculoskeletal:  No deformities, BUE and BLE strength normal and equal Skin: warm and dry  Neuro:  CNs 2-12 intact, no focal abnormalities noted Psych:  Normal affect   EKG:  The EKG was personally reviewed and demonstrates:  Junctional rhythm, HR 48, poor R wave Progression anterior leads  Telemetry:  Telemetry was personally reviewed and demonstrates:  Junctional rhythm/bradycardia with HR upper 40s. At 2:36pm patient converted to NSR with HR 75. At 3:14pm 13 runs of SVT. Patient currently in NSR, HR 70s  Relevant CV Studies:  Cardiac Cath 12/06/93  LV end diastolic pressure is normal.  Dist LM to Prox LAD lesion is 30% stenosed.  Mid LAD-1 lesion is 95% stenosed. Mid LAD-2 lesion is 100% stenosed.  Ramus lesion is 100% stenosed.  Ost Cx to Mid Cx lesion is 100% stenosed.  Prox RCA lesion is 70% stenosed. Prox RCA to Mid RCA lesion is 90% stenosed. Mid RCA lesion is 95% stenosed. Dist RCA lesion is 100% stenosed with 100% stenosed side branch in RPAV.  -------------GRAFTS--------------  LIMA-LAD  graft was visualized by angiography and is normal in caliber. There is no competitive flow  SVG-dRCA graft was visualized by angiography. The graft exhibits severe diffuse disease. Origin to Prox Graft lesion is 90% stenosed. Prox Graft to Mid Graft lesion is 80% stenosed follwed by Dist Graft lesion is 100% stenosed.  -----------INTERVENTION----------------  SVG-OM graft was visualized by angiography.  Mid Graft lesion is 75% stenosed.  A drug-eluting stent was successfully placed using a STENT SYNERGY DES 2.5X12. Postdilated 2.8 mm  ---------------------------  Mid Graft to Dist Graft Stent is 85% stenosed. Balloon angioplasty was performed using a BALLOON [place].  Post intervention, there is a 0% residual  stenosis -in both the new stent, stent overlap in the previous stent.  SVG-RI graft was visualized by angiography. Origin to Prox Graft lesion is 85% stenosed.  A drug-eluting stent was successfully placed using a STENT SYNERGY DES 2.5X20. -Postdilated to 2.7 mm  Post intervention, there is a 0% residual stenosis.   SUMMARY  Severe native vessel CAD: 100% ostial Circumflex (LCx) and ostial Ramus Intermedius, diffuse calcified proximal LAD followed by 95% stenosis just after 1st Diag followed by 100% -all CTO; severe diffuse proximal to mid RCA disease with CTO of the distal RCA  Essentially occluded/atretic SVG-dRCA  Severe proximal to stent and in-stent restenosis in SVG-OM ? Successful PTCA followed by DES PCI with overlapping DES stent (Synergy 2.5 mm x 12 mm - 2.8 mm)  Severe ostial-proximal SVG-RI ? Successful DES PCI (Synergy 2.5 mm x 20 mm--2.7 mm)  Normal LVEDP despite significant systemic hypertension  Echo 01/08/19 1. The left ventricle has moderate-severely reduced systolic function, with an ejection fraction of 30-35%. The cavity size was normal. There is moderate concentric left ventricular hypertrophy. Left ventricular diastolic Doppler parameters are  consistent with pseudonormalization. Elevated left atrial and left ventricular end-diastolic pressures.  2. The right ventricle has moderately reduced systolic function. The cavity was normal. There is no increase in right ventricular wall thickness. Right ventricular systolic pressure is normal.  3. Left atrial size was moderately dilated.  4. Right atrial size was mildly dilated.  5. The mitral valve is degenerative. Moderate thickening of the mitral valve leaflet. There is mild mitral annular calcification present. Mitral valve regurgitation is moderate by color flow Doppler.  6. The aortic valve is tricuspid. Mild thickening of the aortic valve. Aortic valve regurgitation is mild by color flow Doppler.  Laboratory Data:   High Sensitivity Troponin:   Recent Labs  Lab 01/08/19 0220 01/08/19 0534 01/17/19 2133 01/17/19 2248 01/18/19 0311  TROPONINIHS 220* 241* 34* 39* 53*     Cardiac EnzymesNo results for input(s): TROPONINI in the last 168 hours. No results for input(s): TROPIPOC in the last 168 hours.  Chemistry Recent Labs  Lab 01/17/19 2133 01/18/19 0311  NA 133* 134*  K 5.6* 4.8  CL 97* 98  CO2 25 22  GLUCOSE 148* 136*  BUN 45* 43*  CREATININE 6.67* 6.80*  CALCIUM 8.5* 8.6*  GFRNONAA 5* 5*  GFRAA 6* 6*  ANIONGAP 11 14    Recent Labs  Lab 01/17/19 2133  PROT 7.2  ALBUMIN 3.7  AST 48*  ALT 46*  ALKPHOS 68  BILITOT 0.7   Hematology Recent Labs  Lab 01/17/19 2133 01/18/19 0311  WBC 5.8 6.3  RBC 4.18 3.88  HGB 11.8* 11.2*  HCT 39.5 35.9*  MCV 94.5 92.5  MCH 28.2 28.9  MCHC 29.9* 31.2  RDW 18.4* 18.5*  PLT 195 204  BNP Recent Labs  Lab 01/17/19 2133  BNP 1,643.2*    DDimer No results for input(s): DDIMER in the last 168 hours.   Radiology/Studies:  Dg Chest Port 1 View  Result Date: 01/17/2019 CLINICAL DATA:  Shortness of breath EXAM: PORTABLE CHEST 1 VIEW COMPARISON:  01/07/2019 FINDINGS: Right dialysis catheter remain in place, unchanged. Prior CABG. Cardiomegaly. No confluent opacity, effusion or edema. No acute bony abnormality. IMPRESSION: Cardiomegaly.  No active disease. Electronically Signed   By: Rolm Baptise M.D.   On: 01/17/2019 21:19    Assessment and Plan:   1. Sinus node Dysfunction -Patient was admitted 7/3-7/7 for NSTEMI with DES to SVG-OM and SVG-RI. Patient was overall doing well but noticed some dizziness and general fatigue. Patient saw PCP who noted HR in th 40s and patient came into the ER. Patient had been taking Toporol XL 64m daily. - In the ER HS troponin 39>39>53, HMG stable, with mild hyperkalemia, 5.6 - EKG showed a junctional rhythm HR 48 with poor R wave progression. Unsure if this is due to BB and hyperkalemia vs Tachybrady  syndrome -BB held -On Telemetry it appears patient converted NSR HR 70s this afternoon (2:36pm) with some 13 runs of SVT (3:14pm).  -Patient is feeling better today -Recommend 48 ours BB washout followed by reevaluation.  - Avoid AV nodal blocking agents  2. Chronic Systolic CHF -Patient presented with progressive SOB -Last Echo 7/4 LVEF 379-02% diastolic dysfunction, LVH, mild AR and MR -BNP 1643 in the setting of ESRD -does not appear volume overloaded  3. CAD with recent cath 7/6 s/p  DES to SVG-OM and SVG-RI -Patient denies chest pain -Troponin flattened -Continue Imdur, Lipitor, DAPT -holding BB and ARB  4. ESRD on HD (TTS) -Last HD on Saturday, 01/16/19 -Due to Hyperkalemia ARB on hold; improved today 5.6>4.8 -Plan for HD TTS -Per nephrology  5. UTI -per IM -on abx    For questions or updates, please contact [place] consult [email or web address] for contact info under     Signed, Cadence HNinfa Meeker PA-C  01/18/2019 2:17 PM

## 2019-01-18 NOTE — Progress Notes (Signed)
NT called for HR in 30's pt sitting on side of bed, telemetry shows hr 49 unchanged from this am, pt asx denies dizziness cp or sob

## 2019-01-19 ENCOUNTER — Ambulatory Visit: Payer: Self-pay

## 2019-01-19 LAB — URINE CULTURE: Culture: <10000 — AB

## 2019-01-19 LAB — GLUCOSE, CAPILLARY
Glucose-Capillary: 98 mg/dL (ref 70–99)
Glucose-Capillary: 234 mg/dL — ABNORMAL HIGH (ref 70–99)

## 2019-01-19 MED ORDER — CEPHALEXIN 250 MG PO CAPS
250.0000 mg | ORAL_CAPSULE | ORAL | Status: DC
  Administered 2019-01-19: 250 mg via ORAL
  Filled 2019-01-19: qty 1

## 2019-01-19 MED ORDER — CEPHALEXIN 250 MG PO CAPS: 250.0000 mg | ORAL_CAPSULE | Freq: Three times a day (TID) | ORAL | 0 refills | Status: AC

## 2019-01-19 NOTE — Progress Notes (Signed)
Renal Navigator notified OP HD clinic/[facility] [address] of patient's plan for discharge today and last inpatient HD in order to provide continuity of care.  Alphonzo Cruise, [place] Renal Navigator [phone]

## 2019-01-19 NOTE — Discharge Summary (Addendum)
Discharge Summary  Monica [name] WNI:[id number] DOB: 1930/11/07  PCP: Benito Mccreedy, MD  Admit date: 01/17/2019 Discharge date: 01/19/2019  Time spent: 35 minutes  Recommendations for Outpatient Follow-up:  1. Follow-up with your cardiologist 2. Follow-up with your nephrologist 3. Take your medications as prescribed  Discharge Diagnoses:  Active Hospital Problems   Diagnosis Date Noted  . Bradycardia 01/17/2019  . Hyperkalemia 01/18/2019  . Acute lower UTI 01/18/2019  . Elevated troponin   . Junctional bradycardia   . Diabetes mellitus with peripheral circulatory disorder ([place]) 02/05/2018  . CAD (coronary artery disease) 10/05/2015  . ESRD (end stage renal disease) ([place]) 04/24/2015    Resolved Hospital Problems  No resolved problems to display.    Discharge Condition: Stable  Diet recommendation: Resume previous diet renal low-sodium diet  Vitals:   01/19/19 0843 01/19/19 1129  BP: (!) 125/58 (!) 143/65  Pulse: 74 76  Resp: 18 17  Temp:  98.2 F (36.8 C)  SpO2:  100%    History of present illness:   Monica [name] is a 85 y.o. female with medical history significant for dementia, coronary artery disease with an STEMI a week ago treated with stents, ESRD on hemodialysis, insulin-dependent diabetes mellitus, and hypertension, now presenting to the emergency department for evaluation of bradycardia.  Patient was discharged from the hospital 1 week ago after she was treated for NSTEMI with DES to SVG-OM and SVG-RI, now presenting to the emergency department for evaluation of bradycardia that was noted during her follow-up appointment.  Patient reportedly completed dialysis on 01/16/2019, may have had some mild dyspnea, and has been experiencing dysuria, but had overall been doing well since leaving the hospital.  She saw her PCP today for discharge follow-up appointment, was noted to be bradycardic, plan was initially to reduce her Toprol by 50%, but with heart rate in the 40s,  she was directed to the ED for further evaluation.  She reports occasional mild lightheadedness but no headache or chest pain, and no fever, chills, or cough.   [facility] ED Course: Upon arrival to the ED, patient is found to be afebrile, saturating well on room air, bradycardic in the 40s, and with stable blood pressure.  EKG features a junctional rhythm with rate 46, LVH, and repolarization abnormality.  Chest x-ray is notable for cardiomegaly with no acute findings.  Chemistry panel features a sodium of 133, potassium 5.6, slight elevation in transaminases, normal bicarbonate, and BUN 45.  Troponin is slightly elevated and BNP elevated to 1643.  Urinalysis is nitrite positive.  Cardiology was consulted by the ED physician and it was recommended that patient be observed on the medical service and her beta-blocker be held.  01/19/19: Patient was seen and examined with her daughter at bedside.  She denies any chest pain, dyspnea or palpitations.  Heart rate is back to normal rate.  She last received hemodialysis yesterday 3/81/8299 with no complication.  1.5 L of fluid was removed.  Hospital Course:  Principal Problem:   Bradycardia Active Problems:   CAD (coronary artery disease)   ESRD (end stage renal disease) (HCC)   Diabetes mellitus with peripheral circulatory disorder (HCC)   Hyperkalemia   Acute lower UTI   Elevated troponin   Junctional bradycardia  Sinus node dysfunction, worsened by beta-blocker therapy Continue to hold off beta-blocker Heart rate is back to normal rate off BB Recommendations per Cardiology  Elevated troponin likely related to CKD Per cardiology no suspicion for ACS  ESRD TTS  Last hemodialysis was yesterday 09/19/1759 without complication Resume hemodialysis tomorrow Thursday.  Severe native vessel CAD/Severe proximal to stent and in-stent restenosis in SVG-OM Follow-up with cardiology outpatient  UTI  - Patient complains of dysuria, was  started on ciprofloxacin 7/13 and took one dose, and UA is noted to be nitrite-positive  - Send urine for culture, continue treatment with Rocephin pending clinical course and culture data Switch to keflex 250 mg TID x 5 days  5. Insulin-dependent DM  - A1c was 7.0% in July 2020  - Continue CBG-monitoring, insulin   PPE: Mask, face shield  DVT prophylaxis: sq heparin  3 times daily Code Status: Full  Family Communication:  Daughter updated at bedside.  All questions answered to her satisfaction. Consults called: Cardiology consulted by ED physician   Discharge Exam: BP (!) 143/65 (BP Location: Left Arm)   Pulse 76   Temp 98.2 F (36.8 C) (Oral)   Resp 17   Ht 5\' 2"  (1.575 m)   Wt 72.7 kg   SpO2 100%   BMI 29.32 kg/m  . General: 85 y.o. year-old female well developed well nourished in no acute distress.  Alert and oriented x3. . Cardiovascular: Regular rate and rhythm with no rubs or gallops.  No thyromegaly or JVD noted.   Marland Kitchen Respiratory: Clear to auscultation with no wheezes or rales. Good inspiratory effort. . Abdomen: Soft nontender nondistended with normal bowel sounds x4 quadrants. . Musculoskeletal: No lower extremity edema. 2/4 pulses in all 4 extremities. Marland Kitchen Psychiatry: Mood is appropriate for condition and setting  Discharge Instructions You were cared for by a hospitalist during your hospital stay. If you have any questions about your discharge medications or the care you received while you were in the hospital after you are discharged, you can call the unit and asked to speak with the hospitalist on call if the hospitalist that took care of you is not available. Once you are discharged, your primary care physician will handle any further medical issues. Please note that NO REFILLS for any discharge medications will be authorized once you are discharged, as it is imperative that you return to your primary care physician (or establish a relationship with a primary care  physician if you do not have one) for your aftercare needs so that they can reassess your need for medications and monitor your lab values.  Discharge Instructions    AMB Referral to [facility]   Complete by: As directed    Active patient with TOC - Please assign to community nurse to continue [facility] for complex care and disease management follow up calls and assess for further needs.  Patient primary is [place].  Questions please call:   Natividad Brood, RN BSN [facility] Liaison  [phone] business mobile phone Toll free office [phone]  Fax number: [phone] [email or web address]  [email or web address]   Reason for consult: Post hospital follow up - ACTIVE member   Diagnoses of: Other   Other Diagnosis: Bradycardia - medication adjustments   Expected date of contact: 1-3 days (reserved for hospital discharges)     Allergies as of 01/19/2019   No Known Allergies     Medication List    STOP taking these medications   ciprofloxacin 250 MG tablet Commonly known as: CIPRO   losartan 25 MG tablet Commonly known as: COZAAR   metoprolol succinate 25 MG 24 hr tablet Commonly known as: TOPROL-XL     TAKE these medications   acetaminophen 500 MG tablet Commonly  known as: TYLENOL Take 1,000 mg by mouth every 6 (six) hours as needed for mild pain or headache.   albuterol 108 (90 Base) MCG/ACT inhaler Commonly known as: VENTOLIN HFA Inhale 2 puffs into the lungs every 6 (six) hours as needed for wheezing or shortness of breath.   allopurinol 100 MG tablet Commonly known as: ZYLOPRIM Take 100 mg by mouth 2 (two) times daily.   aspirin 81 MG EC tablet Take 1 tablet (81 mg total) by mouth daily.   atorvastatin 80 MG tablet Commonly known as: LIPITOR Take 80 mg by mouth daily.   cephALEXin 250 MG capsule Commonly known as: KEFLEX Take 1 capsule (250 mg total) by mouth every 8 (eight) hours for 5 days.    clopidogrel 75 MG tablet Commonly known as: PLAVIX Take 75 mg by mouth daily.   diphenhydrAMINE 25 MG tablet Commonly known as: BENADRYL Take 25 mg by mouth as needed for itching or allergies.   ferric citrate 1 GM 210 MG(Fe) tablet Commonly known as: AURYXIA Take 210 mg by mouth 3 (three) times daily with meals.   hydrALAZINE 25 MG tablet Commonly known as: APRESOLINE Take 1 tablet (25 mg total) by mouth 3 (three) times daily.   insulin glargine 100 UNIT/ML injection Commonly known as: LANTUS Inject 10 Units into the skin at bedtime.   isosorbide mononitrate 30 MG 24 hr tablet Commonly known as: IMDUR Take 30 mg by mouth daily.   latanoprost 0.005 % ophthalmic solution Commonly known as: XALATAN Place 1 drop into both eyes at bedtime.   loratadine 10 MG tablet Commonly known as: CLARITIN Take 10 mg by mouth as needed for allergies.   Melatonin 5 MG Caps Take 5 mg by mouth at bedtime.   multivitamin Tabs tablet Take 1 tablet by mouth daily.   nitroGLYCERIN 0.4 MG SL tablet Commonly known as: NITROSTAT PLACE 1 TABLET BY MOUTH UNDER THE TONGUE EVERY 5 MINUTES AS NEEDED FOR CHEST PAIN. FOR UP TO 3 DOSES. IF NO RELIEF CALL 911 What changed:   how much to take  how to take this  when to take this  reasons to take this  additional instructions   Vitamin D-3 25 MCG (1000 UT) Caps Take 1,000 Units by mouth daily.      No Known Allergies Follow-up Information    Benito Mccreedy, MD. Go on 01/21/2019.   Specialty: Internal Medicine Why: Please call for a post hospital follow-up appointment.@9 :30am Contact information: [address] [place] [address] [phone]        Dorothy Spark, MD.   Specialty: Cardiology Contact information: [address] [place] [address] [phone]            The results of significant diagnostics from this hospitalization (including imaging, microbiology, ancillary and  laboratory) are listed below for reference.    Significant Diagnostic Studies: Dg Chest 2 View  Result Date: 01/07/2019 CLINICAL DATA:  Patient with generalized chest pain. EXAM: CHEST - 2 VIEW COMPARISON:  Chest radiograph 06/13/2018 FINDINGS: Central venous catheter tip projects over the right atrium. Stable cardiomegaly. Small bilateral pleural effusions. Similar-appearing bibasilar atelectasis. Similar-appearing interstitial opacities bilaterally. Thoracic spine degenerative changes. IMPRESSION: Cardiomegaly. Small bilateral pleural effusions and underlying atelectasis. Mild interstitial edema. Electronically Signed   By: Lovey Newcomer M.D.   On: 01/07/2019 18:24   Dg Chest Port 1 View  Result Date: 01/17/2019 CLINICAL DATA:  Shortness of breath EXAM: PORTABLE CHEST 1 VIEW COMPARISON:  01/07/2019 FINDINGS: Right dialysis catheter remain in place, unchanged. Prior CABG. Cardiomegaly. No confluent opacity, effusion or edema. No acute bony abnormality. IMPRESSION: Cardiomegaly.  No active disease. Electronically Signed   By: Rolm Baptise M.D.   On: 01/17/2019 21:19   Dg Chest Port 1v Same Day  Result Date: 01/07/2019 CLINICAL DATA:  Shortness of breath. EXAM: PORTABLE CHEST 1 VIEW COMPARISON:  Radiographs of January 07, 2019. FINDINGS: Stable cardiomegaly. Status post coronary artery bypass graft. Right internal jugular dialysis catheter is unchanged in position. No pneumothorax or pleural effusion is noted. No acute pulmonary disease is noted. Bony thorax is unremarkable. IMPRESSION: No active disease. Electronically Signed   By: Marijo Conception M.D.   On: 01/07/2019 21:25    Microbiology: Recent Results (from the past 240 hour(s))  SARS Coronavirus 2 (Hosp order,Performed in [facility] lab via [employer] ID)     Status: None   Collection Time: 01/17/19 10:48 PM   Specimen: Dry Nasal Swab (A[employer] ID Now)  Result Value Ref Range Status   SARS Coronavirus 2 (Abbott ID Now) NEGATIVE NEGATIVE Final     Comment: (NOTE) SARS-CoV-2 target nucleic acids are NOT DETECTED. The SARS-CoV-2 RNA is generally detectable in upper and lower respiratory specimens during the acute phase of infection.  Negativeresults do not preclude SARS-CoV-2 infection, do not rule out coinfections with other pathogens, and should not be used as the  sole basis for treatment or other patient management decisions.  Negative results must be combined with clinical observations, patient history, and epidemiological information. The expected result is Negative. Fact Sheet for Patients: GolfingFamily.no Fact Sheet for Healthcare Providers: https://www.hernandez-brewer.com/ This test is not yet approved or cleared by the [place] FDA and  has been authorized for detection and/or diagnosis of SARS-CoV-2 by FDA under an Emergency Use Authorization (EUA).  This EUA will remain in effect (meaning this test can be used) for the duration of  the COVID19 declaration under Section 5 64(b)(1) of the Act, 21 U.S.C.  section [phone] 3(b)(1), unless the authorization is terminated or revoked sooner. Performed at [facility], [address] [place], [place] [address]   SARS Coronavirus 2 (CEPHEID - Performed in [facility] hospital lab), Hosp Order     Status: None   Collection Time: 01/18/19  1:40 AM   Specimen: Nasal Mucosa; Nasopharyngeal  Result Value Ref Range Status   SARS Coronavirus 2 NEGATIVE NEGATIVE Final    Comment: (NOTE) If result is NEGATIVE SARS-CoV-2 target nucleic acids are NOT DETECTED. The SARS-CoV-2 RNA is generally detectable in upper and lower  respiratory specimens during the acute phase of infection. The lowest  concentration of SARS-CoV-2 viral copies this assay can detect is 250  copies / mL. A negative result does not preclude SARS-CoV-2 infection  and should not be used as the sole basis for treatment or other  patient management decisions.  A  negative result may occur with  improper specimen collection / handling, submission of specimen other  than nasopharyngeal swab, presence of viral mutation(s) within the  areas targeted by this assay, and inadequate number of viral copies  (<250 copies / mL). A negative result must be combined with clinical  observations, patient history, and epidemiological information. If result is POSITIVE SARS-CoV-2 target nucleic acids are DETECTED. The SARS-CoV-2 RNA is generally detectable in upper and lower  respiratory specimens dur ing the acute phase of infection.  Positive  results are indicative of active infection with SARS-CoV-2.  Clinical  correlation with patient history and other diagnostic information is  necessary to determine patient infection status.  Positive results do  not rule out bacterial infection or co-infection with other viruses. If result is PRESUMPTIVE POSTIVE SARS-CoV-2 nucleic acids MAY BE PRESENT.   A presumptive positive result was obtained on the submitted specimen  and confirmed on repeat testing.  While 2019 novel coronavirus  (SARS-CoV-2) nucleic acids may be present in the submitted sample  additional confirmatory testing may be necessary for epidemiological  and / or clinical management purposes  to differentiate between  SARS-CoV-2 and other Sarbecovirus currently known to infect humans.  If clinically indicated additional testing with an alternate test  methodology [phone]) is advised. The SARS-CoV-2 RNA is generally  detectable in upper and lower respiratory sp ecimens during the acute  phase of infection. The expected result is Negative. Fact Sheet for Patients:  StrictlyIdeas.no Fact Sheet for Healthcare Providers: BankingDealers.co.za This test is not yet approved or cleared by the [place] FDA and has been authorized for detection and/or diagnosis of SARS-CoV-2 by FDA under an Emergency Use  Authorization (EUA).  This EUA will remain in effect (meaning this test can be used) for the duration of the COVID-19 declaration under Section 564(b)(1) of the Act, 21 U.S.C. section 360bbb-3(b)(1), unless the authorization is terminated or revoked sooner. Performed at [facility], [place] [address], [place], [place] [address]   MRSA PCR Screening     Status: None   Collection Time: 01/18/19  1:40 AM   Specimen: Nasal Mucosa; Nasopharyngeal  Result Value Ref Range Status   MRSA by PCR NEGATIVE NEGATIVE Final    Comment:        The GeneXpert MRSA Assay (FDA approved for NASAL specimens only), is one component of a comprehensive MRSA colonization surveillance program. It is not intended to diagnose MRSA infection nor to guide or monitor treatment for MRSA infections. Performed at [facility], [place] [address], [place], [place] [address]   Culture, Urine     Status: Abnormal   Collection Time: 01/18/19  7:48 AM   Specimen: Urine, Random  Result Value Ref Range Status   Specimen Description URINE, RANDOM  Final   Special Requests NONE  Final   Culture (A)  Final    <10,000 COLONIES/mL INSIGNIFICANT GROWTH Performed at [facility], [place] [address], [place], [place] [address]    Report Status 01/19/2019 FINAL  Final     Labs: Basic Metabolic Panel: Recent Labs  Lab 01/17/19 2133 01/18/19 0311  NA 133* 134*  K 5.6* 4.8  CL 97* 98  CO2 25 22  GLUCOSE 148* 136*  BUN 45* 43*  CREATININE 6.67* 6.80*  CALCIUM 8.5* 8.6*   Liver Function Tests: Recent Labs  Lab 01/17/19 2133  AST 48*  ALT 46*  ALKPHOS 68  BILITOT 0.7  PROT 7.2  ALBUMIN 3.7   No results for input(s): LIPASE, AMYLASE in the last 168 hours. No results for input(s): AMMONIA in the last 168 hours. CBC: Recent Labs  Lab 01/17/19 2133 01/18/19 0311  WBC 5.8 6.3  NEUTROABS 3.2  --   HGB 11.8* 11.2*  HCT 39.5 35.9*  MCV 94.5 92.5  PLT 195 204   Cardiac Enzymes: No results for  input(s): CKTOTAL, CKMB, CKMBINDEX, TROPONINI in the last 168 hours. BNP: BNP (last 3 results) Recent Labs    01/07/19 1850 01/17/19 2133  BNP 3,534.3* 1,643.2*    ProBNP (last 3  results) No results for input(s): PROBNP in the last 8760 hours.  CBG: Recent Labs  Lab 01/18/19 1107 01/18/19 1823 01/18/19 2118 01/19/19 0639 01/19/19 1118  GLUCAP 123* 91 103* 98 234*       Signed:  Kayleen Memos, MD [facility] 01/19/2019, 12:42 PM

## 2019-01-19 NOTE — Discharge Instructions (Signed)
Bradycardia, Adult Bradycardia is a slower-than-normal heartbeat. A normal resting heart rate for an adult ranges from 60 to 100 beats per minute. With bradycardia, the resting heart rate is less than 60 beats per minute. Bradycardia can prevent enough oxygen from reaching certain areas of your body when you are active. It can be serious if it keeps enough oxygen from reaching your brain and other parts of your body. Bradycardia is not a problem for everyone. For some healthy adults, a slow resting heart rate is normal. What are the causes? This condition may be caused by:  A problem with the heart, including: ? A problem with the heart's electrical system, such as a heart block. With a heart block, electrical signals between the chambers of the heart are partially or completely blocked, so they are not able to work as they should. ? A problem with the heart's natural pacemaker (sinus node). ? Heart disease. ? A heart attack. ? Heart damage. ? Lyme disease. ? A heart infection. ? A heart condition that is present at birth (congenital heart defect).  Certain medicines that treat heart conditions.  Certain conditions, such as hypothyroidism and obstructive sleep apnea.  Problems with the balance of chemicals and other substances, like potassium, in the blood.  Trauma.  Radiation therapy. What increases the risk? You are more likely to develop this condition if you:  Are age 33 or older.  Have high blood pressure (hypertension), high cholesterol (hyperlipidemia), or diabetes.  Drink heavily, use tobacco or nicotine products, or use drugs. What are the signs or symptoms? Symptoms of this condition include:  Light-headedness.  Feeling faint or fainting.  Fatigue and weakness.  Trouble with activity or exercise.  Shortness of breath.  Chest pain (angina).  Drowsiness.  Confusion.  Dizziness. How is this diagnosed? This condition may be diagnosed based on:  Your  symptoms.  Your medical history.  A physical exam. During the exam, your health care provider will listen to your heartbeat and check your pulse. To confirm the diagnosis, your health care provider may order tests, such as:  Blood tests.  An electrocardiogram (ECG). This test records the heart's electrical activity. The test can show how fast your heart is beating and whether the heartbeat is steady.  A test in which you wear a portable device (event recorder or Holter monitor) to record your heart's electrical activity while you go about your day.  Anexercise test. How is this treated? Treatment for this condition depends on the cause of the condition and how severe your symptoms are. Treatment may involve:  Treatment of the underlying condition.  Changing your medicines or how much medicine you take.  Having a small, battery-operated device called a pacemaker implanted under the skin. When bradycardia occurs, this device can be used to increase your heart rate and help your heart beat in a regular rhythm. Follow these instructions at home: Lifestyle   Manage any health conditions that contribute to bradycardia as told by your health care provider.  Follow a heart-healthy diet. A nutrition specialist (dietitian) can help educate you about healthy food options and changes.  Follow an exercise program that is approved by your health care provider.  Maintain a healthy weight.  Try to reduce or manage your stress, such as with yoga or meditation. If you need help reducing stress, ask your health care provider.  Do not use any products that contain nicotine or tobacco, such as cigarettes, e-cigarettes, and chewing tobacco. If you need help  quitting, ask your health care provider.  Do not use illegal drugs.  Limit alcohol intake to no more than 1 drink a day for nonpregnant women and 2 drinks a day for men. Be aware of how much alcohol is in your drink. In the [place] one drink  equals one 12 oz bottle of beer (355 mL), one 5 oz glass of wine (148 mL), or one 1 oz glass of hard liquor (44 mL). General instructions  Take over-the-counter and prescription medicines only as told by your health care provider.  Keep all follow-up visits as told by your health care provider. This is important. How is this prevented? In some cases, bradycardia may be prevented by:  Treating underlying medical problems.  Stopping behaviors or medicines that can trigger the condition. Contact a health care provider if you:  Feel light-headed or dizzy.  Almost faint.  Feel weak or are easily fatigued during physical activity.  Experience confusion or have memory problems. Get help right away if:  You faint.  You have: ? An irregular heartbeat (palpitations). ? Chest pain. ? Trouble breathing. Summary  Bradycardia is a slower-than-normal heartbeat. With bradycardia, the resting heart rate is less than 60 beats per minute.  Treatment for this condition depends on the cause.  Manage any health conditions that contribute to bradycardia as told by your health care provider.  Do not use any products that contain nicotine or tobacco, such as cigarettes, e-cigarettes, and chewing tobacco, and limit alcohol intake.  Keep all follow-up visits as told by your health care provider. This is important. This information is not intended to replace advice given to you by your health care provider. Make sure you discuss any questions you have with your health care provider. Document Released: 03/15/2002 Document Revised: 01/04/2018 Document Reviewed: 12/02/2017 Elsevier Patient Education  2020 [employer].

## 2019-01-19 NOTE — Progress Notes (Signed)
Subjective:  HR stable in the 70's-80's overnight.  Completed HD yest-  Removed 1500- tolerated well  Objective Vital signs in last 24 hours: Vitals:   01/19/19 0000 01/19/19 0517 01/19/19 0521 01/19/19 0843  BP: 124/75 (!) 146/56  (!) 125/58  Pulse: 81 77  74  Resp: 20 20  18   Temp: 98.5 F (36.9 C) 98.6 F (37 C)    TempSrc: Oral Oral    SpO2: 99% 100%    Weight:   72.7 kg   Height:       Weight change: 4.68 kg  Intake/Output Summary (Last 24 hours) at 01/19/2019 0854 Last data filed at 01/19/2019 0455 Gross per 24 hour  Intake 835.22 ml  Output 1509 ml  Net -673.78 ml    [facility] - TTS 3.5 hours, 350 BFR-  Using [facility] for now until getting permission from surgeon for her AVF EDW 162 pounds- per pt cannot take off more than 5 pounds 180 dialyzer, 3 K, no heparin, 1 of hectorol per tx, ferrlecit  62.5 weekly   Assessment/ Plan: Pt is a 85 y.o. yo female with ESRD and CAD who was admitted on 01/17/2019 with bradycardia on a betablocker  Assessment/Plan: 1.  Bradycardia- resolved with holding the beta blocker- con to hold 2. ESRD- normally TTS as OP - HD yest- went fine 3. Anemia- not an issue  4. HTN/volume-  Got a little under EDW yest-  BP seems fine this AM- started on low dose hydralazine  5. Dispo-  I see no reason patient could not be discharged today -  Will let her home HD unit know she will be back there tomorrow upon discharge   [place]: Basic Metabolic Panel: Recent Labs  Lab 01/17/19 2133 01/18/19 0311  NA 133* 134*  K 5.6* 4.8  CL 97* 98  CO2 25 22  GLUCOSE 148* 136*  BUN 45* 43*  CREATININE 6.67* 6.80*  CALCIUM 8.5* 8.6*   Liver Function Tests: Recent Labs  Lab 01/17/19 2133  AST 48*  ALT 46*  ALKPHOS 68  BILITOT 0.7  PROT 7.2  ALBUMIN 3.7   No results for input(s): LIPASE, AMYLASE in the last 168 hours. No results for input(s): AMMONIA in the last 168 hours. CBC: Recent Labs  Lab 01/17/19 2133 01/18/19 0311  WBC  5.8 6.3  NEUTROABS 3.2  --   HGB 11.8* 11.2*  HCT 39.5 35.9*  MCV 94.5 92.5  PLT 195 204   Cardiac Enzymes: No results for input(s): CKTOTAL, CKMB, CKMBINDEX, TROPONINI in the last 168 hours. CBG: Recent Labs  Lab 01/18/19 0632 01/18/19 1107 01/18/19 1823 01/18/19 2118 01/19/19 0639  GLUCAP 131* 123* 91 103* 98    Iron Studies: No results for input(s): IRON, TIBC, TRANSFERRIN, FERRITIN in the last 72 hours. Studies/Results: Dg Chest Port 1 View  Result Date: 01/17/2019 CLINICAL DATA:  Shortness of breath EXAM: PORTABLE CHEST 1 VIEW COMPARISON:  01/07/2019 FINDINGS: Right dialysis catheter remain in place, unchanged. Prior CABG. Cardiomegaly. No confluent opacity, effusion or edema. No acute bony abnormality. IMPRESSION: Cardiomegaly.  No active disease. Electronically Signed   By: Rolm Baptise M.D.   On: 01/17/2019 21:19   Medications: Infusions: . sodium chloride 250 mL (01/19/19 0203)  . cefTRIAXone (ROCEPHIN)  IV 1 g (01/19/19 0205)    Scheduled Medications: . allopurinol  100 mg Oral BID  . aspirin EC  81 mg Oral Daily  . atorvastatin  80 mg Oral q1800  .  Chlorhexidine Gluconate Cloth  6 each Topical Q0600  . clopidogrel  75 mg Oral Daily  . doxercalciferol  1 mcg Intravenous Q T,Th,Sa-HD  . heparin  5,000 Units Subcutaneous Q8H  . hydrALAZINE  25 mg Oral TID  . insulin aspart  0-5 Units Subcutaneous QHS  . insulin aspart  0-9 Units Subcutaneous TID WC  . isosorbide mononitrate  30 mg Oral Daily  . latanoprost  1 drop Both Eyes QHS  . Melatonin  6 mg Oral QHS  . sodium chloride flush  3 mL Intravenous Q12H  . sodium chloride flush  3 mL Intravenous Q12H    have reviewed scheduled and prn medications.  Physical Exam: General: NAD Heart: RRR Lungs: mostly clear Abdomen: soft, nontender Extremities: no edema Dialysis Access: AVF but also TDC     01/19/2019,8:54 AM  LOS: 1 day

## 2019-01-19 NOTE — Progress Notes (Signed)
   No recurrence of bradycardia off low-dose Toprol-XL.  Additionally, no SVT or tachycardia.  Consolidating diagnosis includes sinus node dysfunction, aggravated by low-dose beta-blocker therapy and hyperkalemia.  Plan: Permanently discontinue beta-blocker therapy.  Okay to discharge.  No further cardiac evaluation felt indicated.

## 2019-01-19 NOTE — Progress Notes (Signed)
This encounter was created in error - please disregard.

## 2019-01-19 NOTE — Consult Note (Signed)
   [facility] CM Inpatient Consult   01/19/2019  Monica [name] 1931-05-17 [id number]   Follow up:  Spoke with the patient via hospital room phone and HIPAA verified.  Patient consents to ongoing [facility] following after transitioning home.  Primary Care Provider: Benito Mccreedy, MD Patient requested that this writer to speak with her daughter, [name] at the bedside.  She confirms HIPAA and her contact information [name], [name] Daughter   [phone]   She verbalized understanding of post hospital followup with [facility] management. Also gave her the contact information for [facility] Care Management along with the 24 hour nurse advise line.  Will assign to [place] Coordinator for follow up [telephonic due to COVID].  For questions, pleae contact:  Natividad Brood, RN BSN [facility] Liaison  [phone] business mobile phone Toll free office [phone]  Fax number: [phone] [email or web address]

## 2019-01-20 ENCOUNTER — Encounter: Payer: Self-pay | Admitting: *Deleted

## 2019-01-20 ENCOUNTER — Other Ambulatory Visit: Payer: Self-pay | Admitting: *Deleted

## 2019-01-20 DIAGNOSIS — N186 End stage renal disease: Secondary | ICD-10-CM | POA: Diagnosis not present

## 2019-01-20 DIAGNOSIS — D509 Iron deficiency anemia, unspecified: Secondary | ICD-10-CM | POA: Diagnosis not present

## 2019-01-20 DIAGNOSIS — D631 Anemia in chronic kidney disease: Secondary | ICD-10-CM | POA: Diagnosis not present

## 2019-01-20 DIAGNOSIS — N2581 Secondary hyperparathyroidism of renal origin: Secondary | ICD-10-CM | POA: Diagnosis not present

## 2019-01-20 NOTE — Patient Outreach (Signed)
[facility]) Care Management  01/20/2019  Monica [name] [birth year] [id number]  Referral received 01/19/2019 Initial Outreach 01/20/2019 Discharged from the hospital 01/19/2019 (Bradycardia)  Transition of Care-Weakness/Bradycardia  RN spoke with pt today and explained the purpose for today's call. Inquired if this was a good time to conversant (pt receptive). RN able to review pt's medical issues related to her recent admission as pt indicates stent and a reaction to one of her prescribed medications. Pt states she is doing better and will follow up with her provider on tomorrow post hospitalization. RN able to review the pt's discharge instructions and verified pt has a clear understanding with all medication discussed. Pt states she had all her medications and decline any pharmacy assistance at this time. Pt reports ambulating with use of a cane however remains weak from her recent hospitalization. No other issues or problems presented. Based upon the above information RN offered to enroll pt into a program for ongoing monitoring over the next few weeks (pt receptive). Pt entered with some weakness and strongly encouraged adherence with all her medical appointments and medication administration. Pt received dialysis on T/TH/SAT in the morning as agreed to call another time after that hour of the day.  Will generate a plan of care related to the discussed information and issues pt continue to recovery from with her bradycardia and pending medical appointments along with adherence with all her prescribed medications. Will follow up next week with ongoing transition of care calls accordingly and continue to offer any community resources or tools to assist with her ongoing recovery.   [facility] CM Care Plan Problem One     Most Recent Value  Care Plan Problem One  Recent hospitalization related to Bradycardia  Role Documenting the Problem One  Care Management [place] for Problem  One  Active  [facility] Long Term Goal   Pt will not have a readmission over the next 31 days.  [facility] Long Term Goal Start Date  01/20/19  Interventions for Problem One Long Term Goal  Will discuss the importance of early intervention to avoid readmission with any encounters of  weakness or dizziness and what to do if acute. Will stress the importance of safety with use of her cane and/or rollwing walker for ongoing safety prevention.   [facility] CM Short Term Goal #1   Adherence with post op pending medical appointments over the next 30 days.  [facility] CM Short Term Goal #1 Start Date  01/20/19  Interventions for Short Term Goal #1  Stress the importance of attending all scheudled appointments to avoid risk of readmission. Will verify pt has sufficient transportation and aware of her pending appointments.   [facility] CM Short Term Goal #2   Adherence with all prescribed medications in the next 30 days.  [facility] CM Short Term Goal #2 Start Date  01/20/19  Interventions for Short Term Goal #2  Will review all prescribed medications and eeducate accordingly. Will strongly encouraged adherence and risk if pt does not adhere to her scheduled medication. Will also make pt aware of [place] if there are any issues related to her recent discharge.      Raina Mina, RN Care Management Coordinator [place] Office [phone]

## 2019-01-21 DIAGNOSIS — I119 Hypertensive heart disease without heart failure: Secondary | ICD-10-CM | POA: Diagnosis not present

## 2019-01-21 DIAGNOSIS — I2510 Atherosclerotic heart disease of native coronary artery without angina pectoris: Secondary | ICD-10-CM | POA: Diagnosis not present

## 2019-01-21 DIAGNOSIS — M1A49X0 Other secondary chronic gout, multiple sites, without tophus (tophi): Secondary | ICD-10-CM | POA: Diagnosis not present

## 2019-01-21 DIAGNOSIS — E782 Mixed hyperlipidemia: Secondary | ICD-10-CM | POA: Diagnosis not present

## 2019-01-21 DIAGNOSIS — E1151 Type 2 diabetes mellitus with diabetic peripheral angiopathy without gangrene: Secondary | ICD-10-CM | POA: Diagnosis not present

## 2019-01-21 DIAGNOSIS — Z0001 Encounter for general adult medical examination with abnormal findings: Secondary | ICD-10-CM | POA: Diagnosis not present

## 2019-01-21 DIAGNOSIS — Z794 Long term (current) use of insulin: Secondary | ICD-10-CM | POA: Diagnosis not present

## 2019-01-21 DIAGNOSIS — N186 End stage renal disease: Secondary | ICD-10-CM | POA: Diagnosis not present

## 2019-01-21 DIAGNOSIS — Z992 Dependence on renal dialysis: Secondary | ICD-10-CM | POA: Diagnosis not present

## 2019-01-22 DIAGNOSIS — N186 End stage renal disease: Secondary | ICD-10-CM | POA: Diagnosis not present

## 2019-01-22 DIAGNOSIS — D631 Anemia in chronic kidney disease: Secondary | ICD-10-CM | POA: Diagnosis not present

## 2019-01-22 DIAGNOSIS — D509 Iron deficiency anemia, unspecified: Secondary | ICD-10-CM | POA: Diagnosis not present

## 2019-01-22 DIAGNOSIS — N2581 Secondary hyperparathyroidism of renal origin: Secondary | ICD-10-CM | POA: Diagnosis not present

## 2019-01-25 DIAGNOSIS — D509 Iron deficiency anemia, unspecified: Secondary | ICD-10-CM | POA: Diagnosis not present

## 2019-01-25 DIAGNOSIS — I255 Ischemic cardiomyopathy: Secondary | ICD-10-CM | POA: Insufficient documentation

## 2019-01-25 DIAGNOSIS — I495 Sick sinus syndrome: Secondary | ICD-10-CM | POA: Insufficient documentation

## 2019-01-25 DIAGNOSIS — N2581 Secondary hyperparathyroidism of renal origin: Secondary | ICD-10-CM | POA: Diagnosis not present

## 2019-01-25 DIAGNOSIS — D631 Anemia in chronic kidney disease: Secondary | ICD-10-CM | POA: Diagnosis not present

## 2019-01-25 DIAGNOSIS — N186 End stage renal disease: Secondary | ICD-10-CM | POA: Diagnosis not present

## 2019-01-25 NOTE — Progress Notes (Signed)
Cardiology Office Note    Date:  01/26/2019   ID:  Monica [name], DOB March 02, 1931, MRN [id number]  PCP:  Benito Mccreedy, MD  Cardiologist: Ena Dawley, MD EPS: None  Chief Complaint  Patient presents with  . Hospitalization Follow-up    History of Present Illness:  Monica [name] is a 85 y.o. female with history of CAD status post CABG 09/2015, NSTEMI 01/07/2019 with cath showing occluded/a atretic SVG to the distal RCA, severe proximal to stent and in-stent restenosis of SVG to OM with successful PTCA/with overlapping DES stent and successful DES to the ostial proximal SVG to the RI.  Echo showed new reduced LVEF 30 to 35% with moderately dilated right and left atrium moderate MR and mild AI.  Patient was discharged home on Toprol-XL 25 mg once daily.  Patient also has ESRD on HD and dementia.  Patient was readmitted with sinus node dysfunction aggravated by beta-blocker therapy and hyperkalemia and Toprol was stopped.    She did have a 13 beat run of SVT but that did not recur through the rest of the hospitalization.  Elevated troponin was felt secondary to CKD.  Patient comes in today accompanied by her daughter. 2 days ago complained of shortness of breath and chest tightness. Felt better after dialysis. Using inhalers without relief. Chest discomfort after eating relieved with maalox  Patient says it feels different from her heart attack. It's a fullness and feels like food gets stuck. So far she's been able to get it down.  Past Medical History:  Diagnosis Date  . Asthma   . Bilateral pleural effusion    a. s/p CABG >> s/p bilat thoracentesis 11/2015  . Candida infection 10/2015   Bilateral breasts  . Chronic diastolic CHF    Echo 54/56 [facility]):  EF 55-65, normal wall motion, normal diastolic function  . Coronary artery disease    LHC 3/17 >> 3 v CAD >> CABG // MV 01/2018:  Abnormal, low risk stress nuclear study with significant breast attenuation; cannot R/O prior  infarct; no ischemia; EF 56 with normal wall motion. // s/p NSTEMI 11/19 [facility]) >> LHC:  dLM 90, pLAD 100, pLCx 100, mRCA 100, L-LAD ok, S-Dx prox 70, S-OM dist 99 (ulc); S-PDA 100 >>PCI: 2.25 x 24 mm Synergy DES to S-OM //  EF normal by Echo 11/19  . ESRD (end stage renal disease)    Dialysis Tu, Th, Sa  . Essential hypertension   . Gastroesophageal reflux disease   . History of blood transfusion   . History of echocardiogram    a. Echo 3/17: Mild focal basal septal hypertrophy, vigorous LVF, EF 65-70%, normal wall motion, grade 1 diastolic dysfunction, MAC  . Hyperlipidemia   . Long Term Cardiac Monitor 3-14 day    LT Monitor 09/2018: SR, Sinus Tach, 6 beats NSVT, 38 runs of SVT (brief); no AFib.  . Mass   . Peptic ulcer   . Protein calorie malnutrition ([place])   . Type 2 diabetes mellitus ([place])     Past Surgical History:  Procedure Laterality Date  . ABDOMINAL HYSTERECTOMY    . CARDIAC CATHETERIZATION N/A 09/17/2015   Procedure: Left Heart Cath and Coronary Angiography;  Surgeon: Jettie Booze, MD;  Location: [place] CV LAB;  Service: Cardiovascular;  Laterality: N/A;  . COLON SURGERY    . CORONARY ARTERY BYPASS GRAFT N/A 09/19/2015   Procedure: CORONARY ARTERY BYPASS GRAFTING (CABG) TIMES FOUR USING BILATARAL SAPHENOUS VEIN GRAFTS AND  LEFT INTERNAL MAMMARY ARTERY;  Surgeon: [name], MD;  Location: [place];  Service: Open Heart Surgery;  Laterality: N/A;  . CORONARY STENT INTERVENTION N/A 01/10/2019   Procedure: CORONARY STENT INTERVENTION;  Surgeon: Leonie Man, MD;  Location: [place] CV [facility];  Service: Cardiovascular;  Laterality: N/A;  . LEFT HEART CATH AND CORS/GRAFTS ANGIOGRAPHY N/A 01/10/2019   Procedure: LEFT HEART CATH AND CORS/GRAFTS ANGIOGRAPHY;  Surgeon: Leonie Man, MD;  Location: [place] [facility];  Service: Cardiovascular;  Laterality: N/A;  . TEE WITHOUT CARDIOVERSION N/A 09/19/2015   Procedure: TRANSESOPHAGEAL ECHOCARDIOGRAM (TEE);   Surgeon: [name], MD;  Location: [place];  Service: Open Heart Surgery;  Laterality: N/A;    Current Medications: Current Meds  Medication Sig  . acetaminophen (TYLENOL) 500 MG tablet Take 1,000 mg by mouth every 6 (six) hours as needed for mild pain or headache.  . albuterol (PROVENTIL HFA;VENTOLIN HFA) 108 (90 Base) MCG/ACT inhaler Inhale 2 puffs into the lungs every 6 (six) hours as needed for wheezing or shortness of breath.  . allopurinol (ZYLOPRIM) 100 MG tablet Take 100 mg by mouth 2 (two) times daily.   Marland Kitchen aspirin EC 81 MG EC tablet Take 1 tablet (81 mg total) by mouth daily.  Marland Kitchen atorvastatin (LIPITOR) 80 MG tablet Take 80 mg by mouth daily.   . Cholecalciferol (VITAMIN D-3) 1000 units CAPS Take 1,000 Units by mouth daily.   . clopidogrel (PLAVIX) 75 MG tablet Take 75 mg by mouth daily.   . diphenhydrAMINE (BENADRYL) 25 MG tablet Take 25 mg by mouth as needed for itching or allergies.   . ferric citrate (AURYXIA) 1 GM 210 MG(Fe) tablet Take 210 mg by mouth 3 (three) times daily with meals.   . hydrALAZINE (APRESOLINE) 25 MG tablet Take 1 tablet (25 mg total) by mouth 3 (three) times daily.  . insulin glargine (LANTUS) 100 UNIT/ML injection Inject 10 Units into the skin at bedtime.   . isosorbide mononitrate (IMDUR) 30 MG 24 hr tablet Take 30 mg by mouth daily.  Marland Kitchen latanoprost (XALATAN) 0.005 % ophthalmic solution Place 1 drop into both eyes at bedtime.  Marland Kitchen loratadine (CLARITIN) 10 MG tablet Take 10 mg by mouth as needed for allergies.   . Melatonin 5 MG CAPS Take 5 mg by mouth at bedtime.   . multivitamin (RENA-VIT) TABS tablet Take 1 tablet by mouth daily.  . nitroGLYCERIN (NITROSTAT) 0.4 MG SL tablet PLACE 1 TABLET BY MOUTH UNDER THE TONGUE EVERY 5 MINUTES AS NEEDED FOR CHEST PAIN. FOR UP TO 3 DOSES. IF NO RELIEF CALL 911 (Patient taking differently: Place 0.4 mg under the tongue every 5 (five) minutes as needed for chest pain. FOR UP TO 3 DOSES. IF NO RELIEF CALL 911)      Allergies:   Patient has no known allergies.   Social History   Socioeconomic History  . Marital status: Widowed    Spouse name: Not on file  . Number of children: Not on file  . Years of education: Not on file  . Highest education level: Not on file  Occupational History  . Occupation: Retired  Scientific laboratory technician  . Financial resource strain: Not hard at all  . Food insecurity    Worry: Never true    Inability: Never true  . Transportation needs    Medical: No    Non-medical: No  Tobacco Use  . Smoking status: Never Smoker  . Smokeless tobacco: Former Systems developer  Types: Snuff  Substance and Sexual Activity  . Alcohol use: No    Alcohol/week: 0.0 standard drinks  . Drug use: No  . Sexual activity: Not on file  Lifestyle  . Physical activity    Days per week: Not on file    Minutes per session: Not on file  . Stress: Not on file  Relationships  . Social connections    Talks on phone: More than three times a week    Gets together: More than three times a week    Attends religious service: More than 4 times per year    Active member of club or organization: No    Attends meetings of clubs or organizations: Never    Relationship status: Widowed  Other Topics Concern  . Not on file  Social History Narrative  . Not on file     Family History:  The patient's family history includes Heart attack in her father.   ROS:   Please see the history of present illness.    ROS All other systems reviewed and are negative.   PHYSICAL EXAM:   VS:  BP (!) 168/58   Pulse 60   Ht _0  (1.575 m)   Wt 165 lb (74.8 kg)   BMI 30.18 kg/m   Physical Exam  GEN: Well nourished, well developed, in no acute distress  Neck: no JVD, carotid bruits, or masses Cardiac:RRR; no murmurs, rubs, or gallops  Respiratory:  clear to auscultation bilaterally, normal work of breathing GI: soft, nontender, nondistended, + BS Ext: without cyanosis, clubbing, or edema, Good distal pulses bilaterally  Neuro:  Alert and Oriented x 3 Psych: euthymic mood, full affect  Wt Readings from Last 3 Encounters:  01/26/19 165 lb (74.8 kg)  01/19/19 160 lb 4.8 oz (72.7 kg)  01/11/19 160 lb 15 oz (73 kg)      Studies/Labs Reviewed:   EKG:  EKG is ordered today.  The ekg ordered today demonstrates normal sinus rhythm with PVCs anterior wall MI similar to EKG 01/19/2019  Recent Labs: 01/17/2019: ALT 46; B Natriuretic Peptide 1,643.2 01/18/2019: BUN 43; Creatinine, Ser 6.80; Hemoglobin 11.2; Platelets 204; Potassium 4.8; Sodium 134; TSH 5.980   Lipid Panel    Component Value Date/Time   CHOL 125 09/08/2018 0737   TRIG 109 09/08/2018 0737   HDL 50 09/08/2018 0737   CHOLHDL 2.5 09/08/2018 0737   CHOLHDL 2.8 06/26/2016 1004   VLDL 20 06/26/2016 1004   LDLCALC 53 09/08/2018 0737    Additional studies/ records that were reviewed today include:   Cardiac Cath 12/07/35  LV end diastolic pressure is normal.  Dist LM to Prox LAD lesion is 30% stenosed.  Mid LAD-1 lesion is 95% stenosed. Mid LAD-2 lesion is 100% stenosed.  Ramus lesion is 100% stenosed.  Ost Cx to Mid Cx lesion is 100% stenosed.  Prox RCA lesion is 70% stenosed. Prox RCA to Mid RCA lesion is 90% stenosed. Mid RCA lesion is 95% stenosed. Dist RCA lesion is 100% stenosed with 100% stenosed side branch in RPAV.  -------------GRAFTS--------------  LIMA-LAD graft was visualized by angiography and is normal in caliber. There is no competitive flow  SVG-dRCA graft was visualized by angiography. The graft exhibits severe diffuse disease. Origin to Prox Graft lesion is 90% stenosed. Prox Graft to Mid Graft lesion is 80% stenosed follwed by Dist Graft lesion is 100% stenosed.  -----------INTERVENTION----------------  SVG-OM graft was visualized by angiography.  Mid Graft lesion is 75% stenosed.  A  drug-eluting stent was successfully placed using a STENT SYNERGY DES 2.5X12. Postdilated 2.8 mm  ---------------------------  Mid  Graft to Dist Graft Stent is 85% stenosed. Balloon angioplasty was performed using a BALLOON [place].  Post intervention, there is a 0% residual stenosis -in both the new stent, stent overlap in the previous stent.  SVG-RI graft was visualized by angiography. Origin to Prox Graft lesion is 85% stenosed.  A drug-eluting stent was successfully placed using a STENT SYNERGY DES 2.5X20. -Postdilated to 2.7 mm  Post intervention, there is a 0% residual stenosis.   SUMMARY  Severe native vessel CAD: 100% ostial Circumflex (LCx) and ostial Ramus Intermedius, diffuse calcified proximal LAD followed by 95% stenosis just after 1st Diag followed by 100% -all CTO; severe diffuse proximal to mid RCA disease with CTO of the distal RCA  Essentially occluded/atretic SVG-dRCA  Severe proximal to stent and in-stent restenosis in SVG-OM ? Successful PTCA followed by DES PCI with overlapping DES stent (Synergy 2.5 mm x 12 mm - 2.8 mm)  Severe ostial-proximal SVG-RI ? Successful DES PCI (Synergy 2.5 mm x 20 mm--2.7 mm)  Normal LVEDP despite significant systemic hypertension   Echo 01/08/19 1. The left ventricle has moderate-severely reduced systolic function, with an ejection fraction of 30-35%. The cavity size was normal. There is moderate concentric left ventricular hypertrophy. Left ventricular diastolic Doppler parameters are  consistent with pseudonormalization. Elevated left atrial and left ventricular end-diastolic pressures.  2. The right ventricle has moderately reduced systolic function. The cavity was normal. There is no increase in right ventricular wall thickness. Right ventricular systolic pressure is normal.  3. Left atrial size was moderately dilated.  4. Right atrial size was mildly dilated.  5. The mitral valve is degenerative. Moderate thickening of the mitral valve leaflet. There is mild mitral annular calcification present. Mitral valve regurgitation is moderate by color flow  Doppler.  6. The aortic valve is tricuspid. Mild thickening of the aortic valve. Aortic valve regurgitation is mild by color flow Doppler.       ASSESSMENT:    1. Coronary artery disease involving native coronary artery of native heart without angina pectoris   2. Sinus node dysfunction (HCC)   3. Ischemic cardiomyopathy   4. Chronic combined systolic and diastolic CHF (congestive heart failure) ([place])   5. ESRD (end stage renal disease) ([place])   6. Dysphasia   7. Hyperlipidemia, unspecified hyperlipidemia type      PLAN:  In order of problems listed above:  CAD status post CABG 09/2015, NSTEMI 01/07/2019 status post DES to the SVG to the OM and DES SVG to the RI-patient having chest pain after she eats relieved with Maalox.  EKG unchanged.  We will add Protonix and refer to GI.  Continue Plavix, aspirin and Lipitor  Sinus node dysfunction and bradycardia felt secondary to beta-blocker and hyperkalemia.  Resolved off Toprol.  Brief episode of SVT while in the hospital.  No recurrence.HR got down to 49/m at dialysis. Will place 30 day monitor.  Ischemic cardiomyopathy ejection fraction 30 to 35% on echo 01/08/2019 fluid managed with dialysis.  Compensated today.  Chronic combined systolic and diastolic CHF EF 30 to 02% with diastolic dysfunction compensated  ESRD on HD  Chest pain with dysphasia will start protonix and refer to GI.  Hyperlipidemia check fasting lipid panel and LFTs in 3 months on Lipitor     Medication Adjustments/Labs and Tests Ordered: Current medicines are reviewed at length with the patient today.  Concerns regarding medicines are outlined above.  Medication changes, Labs and Tests ordered today are listed in the Patient Instructions below. Patient Instructions  Medication Instructions:   Your physician has recommended you make the following change in your medication:   1) Start Protonix 20MG, 1 tablet by mouth once a day  If you need a refill on your  cardiac medications before your next appointment, please call your pharmacy.   Lab work:  Your physician recommends that you return for lab work in: 3 months for a fasting lipid panel and liver function test  If you have labs (blood work) drawn today and your tests are completely normal, you will receive your results only by: Marland Kitchen MyChart Message (if you have MyChart) OR . A paper copy in the mail If you have any lab test that is abnormal or we need to change your treatment, we will call you to review the results.  Testing/Procedures:  Your physician has recommended that you wear an event monitor. Event monitors are medical devices that record the heart's electrical activity. Doctors most often [place] these monitors to diagnose arrhythmias. Arrhythmias are problems with the speed or rhythm of the heartbeat. The monitor is a small, portable device. You can wear one while you do your normal daily activities. This is usually used to diagnose what is causing palpitations/syncope (passing out).   Follow-Up: At [facility], you and your health needs are our priority.  As part of our continuing mission to provide you with exceptional heart care, we have created designated Provider Care Teams.  These Care Teams include your primary Cardiologist (physician) and Advanced Practice Providers (APPs -  Physician Assistants and Nurse Practitioners) who all work together to provide you with the care you need, when you need it. You will need a follow up appointment in 2 months.  Please call our office 2 months in advance to schedule this appointment.  You may see Ena Dawley, MD or one of the following Advanced Practice Providers on your designated Care Team:   [place], PA-C Melina Copa, PA-C . Ermalinda Barrios, PA-C  Any Other Special Instructions Will Be Listed Below (If Applicable).  You have been referred to [facility], they will contact you to set up the appointment.        Signed, Ermalinda Barrios, PA-C  01/26/2019 1:02 PM    [facility] [address], [place], [place]  [address] Phone: [phone]; Fax: [phone]

## 2019-01-26 ENCOUNTER — Other Ambulatory Visit: Payer: Self-pay

## 2019-01-26 ENCOUNTER — Telehealth: Payer: Self-pay | Admitting: Physician Assistant

## 2019-01-26 ENCOUNTER — Ambulatory Visit (INDEPENDENT_AMBULATORY_CARE_PROVIDER_SITE_OTHER): Payer: Medicare HMO | Admitting: Physician Assistant

## 2019-01-26 ENCOUNTER — Ambulatory Visit: Payer: Medicare HMO | Admitting: *Deleted

## 2019-01-26 ENCOUNTER — Encounter: Payer: Self-pay | Admitting: Gastroenterology

## 2019-01-26 ENCOUNTER — Other Ambulatory Visit: Payer: Medicare HMO | Admitting: *Deleted

## 2019-01-26 ENCOUNTER — Encounter: Payer: Self-pay | Admitting: Physician Assistant

## 2019-01-26 VITALS — BP 168/58 | HR 60 | Ht 62.0 in | Wt 165.0 lb

## 2019-01-26 DIAGNOSIS — R4702 Dysphasia: Secondary | ICD-10-CM

## 2019-01-26 DIAGNOSIS — I5042 Chronic combined systolic (congestive) and diastolic (congestive) heart failure: Secondary | ICD-10-CM | POA: Diagnosis not present

## 2019-01-26 DIAGNOSIS — I255 Ischemic cardiomyopathy: Secondary | ICD-10-CM

## 2019-01-26 DIAGNOSIS — E785 Hyperlipidemia, unspecified: Secondary | ICD-10-CM

## 2019-01-26 DIAGNOSIS — I495 Sick sinus syndrome: Secondary | ICD-10-CM

## 2019-01-26 DIAGNOSIS — I2510 Atherosclerotic heart disease of native coronary artery without angina pectoris: Secondary | ICD-10-CM

## 2019-01-26 DIAGNOSIS — N186 End stage renal disease: Secondary | ICD-10-CM

## 2019-01-26 MED ORDER — PANTOPRAZOLE SODIUM 20 MG PO TBEC: 20.0000 mg | DELAYED_RELEASE_TABLET | Freq: Every day | ORAL | 3 refills | Status: DC

## 2019-01-26 NOTE — Patient Outreach (Signed)
[facility]) Care Management  01/26/2019  Monica [name] 1931-07-04 [id number]    Transition of care-Bradycardia  RN spoke with pt with update on her ongoing management of care. Pt reports her weakness is getting better with no falls or dizziness. Pt states no symptoms related to bradycardia as experienced before. States she continue to use her cane with no problems ambulating. Pt reports a visit today to a provider's office (CAD) with a medication change. Pt states her daughter will be picking this medication up after her work today. Pt aware what the medication is and why she is taking this medication.   Plan of care discussed with adherence to all medical appointments and medication administration with no reported issues. Also updated interventions on prevention measures related to bradycardia. All interventions discussed with updates as pt aware and will continue to follow the current plan of care.   THN CM Care Plan Problem One     Most Recent Value  Care Plan Problem One  Recent hospitalization related to Bradycardia  Role Documenting the Problem One  Care Management [place] for Problem One  Active  THN Long Term Goal   Pt will not have a readmission over the next 31 days.  THN Long Term Goal Start Date  01/20/19  Interventions for Problem One Long Term Goal  Will verify pt will continue to use her DME to assist with her ongoing ambulation with no incidents or falls reported. Pt states she is not as weak as she was last week. Continues to use her cane with ambulating and receives assistance form her daughter. Will continue to encouraged ongoing adherence with use of preventive measures to avoid readmission.   THN CM Short Term Goal #1   Adherence with post op pending medical appointments over the next 30 days.  THN CM Short Term Goal #1 Start Date  01/20/19  Interventions for Short Term Goal #1  Will continue to encoudraged adherence with all medical  appointments and/or reschedule if unable to attend.   THN CM Short Term Goal #2   Adherence with all prescribed medications in the next 30 days.  THN CM Short Term Goal #2 Start Date  01/20/19  Interventions for Short Term Goal #2  Will stress the importance of taking all medications as recommended. WIll strongly encourage pt to obtain all new prescriptions and administrater accordingly to avoid acute events. Will reassess this goal over the next few weeks for ongoing adherence.      Raina Mina, RN Care Management Coordinator [place] Office [phone]

## 2019-01-26 NOTE — Patient Instructions (Addendum)
Medication Instructions:   Your physician has recommended you make the following change in your medication:   1) Start Protonix 20MG , 1 tablet by mouth once a day  If you need a refill on your cardiac medications before your next appointment, please call your pharmacy.   Lab work:  Your physician recommends that you return for lab work in: 3 months for a fasting lipid panel and liver function test  If you have labs (blood work) drawn today and your tests are completely normal, you will receive your results only by: Marland Kitchen MyChart Message (if you have MyChart) OR . A paper copy in the mail If you have any lab test that is abnormal or we need to change your treatment, we will call you to review the results.  Testing/Procedures:  Your physician has recommended that you wear an event monitor. Event monitors are medical devices that record the heart's electrical activity. Doctors most often [place] these monitors to diagnose arrhythmias. Arrhythmias are problems with the speed or rhythm of the heartbeat. The monitor is a small, portable device. You can wear one while you do your normal daily activities. This is usually used to diagnose what is causing palpitations/syncope (passing out).   Follow-Up: At [facility], you and your health needs are our priority.  As part of our continuing mission to provide you with exceptional heart care, we have created designated Provider Care Teams.  These Care Teams include your primary Cardiologist (physician) and Advanced Practice Providers (APPs -  Physician Assistants and Nurse Practitioners) who all work together to provide you with the care you need, when you need it. You will need a follow up appointment in 2 months.  Please call our office 2 months in advance to schedule this appointment.  You may see Ena Dawley, MD or one of the following Advanced Practice Providers on your designated Care Team:   [place], PA-C Melina Copa, PA-C . Ermalinda Barrios, PA-C  Any Other Special Instructions Will Be Listed Below (If Applicable).  You have been referred to [facility], they will contact you to set up the appointment.

## 2019-01-26 NOTE — Telephone Encounter (Signed)
New Message         COVID-19 Pre-Screening Questions:   In the past 7 to 10 days have you had a cough,  shortness of breath, headache, congestion, fever (100 or greater) body aches, chills, sore throat, or sudden loss of taste or sense of smell?  NO  Have you been around anyone with known Covid 19. NO  Have you been around anyone who is awaiting Covid 19 test results in the past 7 to 10 days? NO  Have you been around anyone who has been exposed to Covid 19, or has mentioned symptoms of Covid 19 within the past 7 to 10 days? NO Pts daughter will be assisting pt because she has a hard time remebering things   If you have any concerns/questions about symptoms patients report during screening (either on the phone or at threshold). Contact the provider seeing the patient or DOD for further guidance.  If neither are available contact a member of the leadership team.

## 2019-01-27 ENCOUNTER — Telehealth: Payer: Self-pay | Admitting: *Deleted

## 2019-01-27 DIAGNOSIS — N186 End stage renal disease: Secondary | ICD-10-CM | POA: Diagnosis not present

## 2019-01-27 DIAGNOSIS — D631 Anemia in chronic kidney disease: Secondary | ICD-10-CM | POA: Diagnosis not present

## 2019-01-27 DIAGNOSIS — D509 Iron deficiency anemia, unspecified: Secondary | ICD-10-CM | POA: Diagnosis not present

## 2019-01-27 DIAGNOSIS — N2581 Secondary hyperparathyroidism of renal origin: Secondary | ICD-10-CM | POA: Diagnosis not present

## 2019-01-27 NOTE — Telephone Encounter (Signed)
-----   Message from Mady Haagensen, [place] sent at 01/27/2019 11:59 AM EDT ----- Regarding: RE: Needs follow up Ermalinda Barrios wants her seen by Dr. [name], so 10/6 would be ok ----- Message ----- From: Nuala Alpha, LPN Sent: 1/70/0174  10:03 AM EDT To: Mady Haagensen, CMA Subject: FW: Needs follow up                            The closest thing I have to an availability for [facility] around that time would be 10/6 at her held DOD slot, other than that, nothing.  Who is wanting [name] to see her in 2 months?  If it needs to be in 2 months the Provider who advised on this follow-up may need to see the pt instead.  Let me know what you think?    Thanks, Karlene Einstein  ----- Message ----- From: Mady Haagensen, CMA Sent: 01/26/2019  12:58 PM EDT To: Nuala Alpha, LPN Subject: Needs follow up                                Patient needs a 2 month follow up with Dr. [name].

## 2019-01-27 NOTE — Telephone Encounter (Signed)
Left the pt a message to call the office back to arrange 2 month follow-up with Dr. [name], as indicated in this note by Estella Husk PA-C.  Left a detailed message on the pts VM that we have an availability for 10/6 at the 1040 or 1140 slot on Dr. Francesca Oman schedule that day.  Left a detailed message for the pt to call the office back and have the operators schedule her into one of those slots, if that day and times offered works for her, or request to speak to me for further assistance in finding an appt, if that date doesn't work at all.

## 2019-01-28 ENCOUNTER — Ambulatory Visit: Payer: Medicare HMO | Admitting: *Deleted

## 2019-01-28 NOTE — Telephone Encounter (Signed)
Talked to the pt to schedule her appt per Cecilie Kicks NP for 10/6 and pt could not do that day, or any other day offered, due to her dialysis scheduled.  Scheduled the pt to see Dr [name] at the next available to accommodate her dialysis scheduled for 05/02/19 at 1:40 pm. Pt is aware that this is a regular office visit.  Pt verbalized understanding and agreed to appt date and time.  Will forward this message to Cecilie Kicks NP as an Juluis Rainier, to make her aware that pts follow-up is now pushed out to 10/26, due to dialysis schedule.

## 2019-01-29 DIAGNOSIS — N2581 Secondary hyperparathyroidism of renal origin: Secondary | ICD-10-CM | POA: Diagnosis not present

## 2019-01-29 DIAGNOSIS — D509 Iron deficiency anemia, unspecified: Secondary | ICD-10-CM | POA: Diagnosis not present

## 2019-01-29 DIAGNOSIS — D631 Anemia in chronic kidney disease: Secondary | ICD-10-CM | POA: Diagnosis not present

## 2019-01-29 DIAGNOSIS — N186 End stage renal disease: Secondary | ICD-10-CM | POA: Diagnosis not present

## 2019-02-01 DIAGNOSIS — N186 End stage renal disease: Secondary | ICD-10-CM | POA: Diagnosis not present

## 2019-02-01 DIAGNOSIS — N2581 Secondary hyperparathyroidism of renal origin: Secondary | ICD-10-CM | POA: Diagnosis not present

## 2019-02-01 DIAGNOSIS — D631 Anemia in chronic kidney disease: Secondary | ICD-10-CM | POA: Diagnosis not present

## 2019-02-01 DIAGNOSIS — D509 Iron deficiency anemia, unspecified: Secondary | ICD-10-CM | POA: Diagnosis not present

## 2019-02-03 ENCOUNTER — Telehealth: Payer: Self-pay | Admitting: Radiology

## 2019-02-03 DIAGNOSIS — N2581 Secondary hyperparathyroidism of renal origin: Secondary | ICD-10-CM | POA: Diagnosis not present

## 2019-02-03 DIAGNOSIS — D509 Iron deficiency anemia, unspecified: Secondary | ICD-10-CM | POA: Diagnosis not present

## 2019-02-03 DIAGNOSIS — N186 End stage renal disease: Secondary | ICD-10-CM | POA: Diagnosis not present

## 2019-02-03 DIAGNOSIS — D631 Anemia in chronic kidney disease: Secondary | ICD-10-CM | POA: Diagnosis not present

## 2019-02-03 NOTE — Telephone Encounter (Signed)
Enrolled patient for a 30 day [employer] Event Monitor to be mailed. Brief instructions were gone over with patient and she knows to expect the monitor to arrive in 3-4 days.

## 2019-02-04 ENCOUNTER — Other Ambulatory Visit: Payer: Self-pay | Admitting: *Deleted

## 2019-02-04 DIAGNOSIS — N186 End stage renal disease: Secondary | ICD-10-CM | POA: Diagnosis not present

## 2019-02-04 DIAGNOSIS — Z992 Dependence on renal dialysis: Secondary | ICD-10-CM | POA: Diagnosis not present

## 2019-02-04 NOTE — Patient Outreach (Addendum)
[facility]) Care Management  02/04/2019  Monica [name] 1931/05/19 [id number]    Transition of care  RN attempted telephone outreach today however unsuccessful and unable to leave a HIPAA voice message. Will follow up within 4 business days with another outreach for transition of care call.  Raina Mina, RN Care Management Coordinator [place] Office [phone]

## 2019-02-05 DIAGNOSIS — N186 End stage renal disease: Secondary | ICD-10-CM | POA: Diagnosis not present

## 2019-02-05 DIAGNOSIS — N2581 Secondary hyperparathyroidism of renal origin: Secondary | ICD-10-CM | POA: Diagnosis not present

## 2019-02-05 DIAGNOSIS — D631 Anemia in chronic kidney disease: Secondary | ICD-10-CM | POA: Diagnosis not present

## 2019-02-05 DIAGNOSIS — D509 Iron deficiency anemia, unspecified: Secondary | ICD-10-CM | POA: Diagnosis not present

## 2019-02-07 ENCOUNTER — Other Ambulatory Visit: Payer: Self-pay

## 2019-02-07 MED ORDER — HYDRALAZINE HCL 25 MG PO TABS: 25.0000 mg | ORAL_TABLET | Freq: Three times a day (TID) | ORAL | 3 refills | Status: DC

## 2019-02-08 DIAGNOSIS — D631 Anemia in chronic kidney disease: Secondary | ICD-10-CM | POA: Diagnosis not present

## 2019-02-08 DIAGNOSIS — D509 Iron deficiency anemia, unspecified: Secondary | ICD-10-CM | POA: Diagnosis not present

## 2019-02-08 DIAGNOSIS — N2581 Secondary hyperparathyroidism of renal origin: Secondary | ICD-10-CM | POA: Diagnosis not present

## 2019-02-08 DIAGNOSIS — N186 End stage renal disease: Secondary | ICD-10-CM | POA: Diagnosis not present

## 2019-02-09 ENCOUNTER — Ambulatory Visit (INDEPENDENT_AMBULATORY_CARE_PROVIDER_SITE_OTHER): Payer: Medicare HMO

## 2019-02-09 ENCOUNTER — Other Ambulatory Visit: Payer: Self-pay | Admitting: *Deleted

## 2019-02-09 DIAGNOSIS — I495 Sick sinus syndrome: Secondary | ICD-10-CM | POA: Diagnosis not present

## 2019-02-09 NOTE — Patient Outreach (Signed)
[facility]) Care Management  02/09/2019  Monica [name] 12/31/1930 [id number]   Transition of care  RN spoke with pt today and received an update on her ongoing management of care. Pt reports she is doing much better with no symptoms of bradycardia, weakness or fatigue as experienced before with her last admission. Pt states she no longer is having issues with her digestion and cancelled the appointments with no additional symptoms. RN discussed ongoing prevention measures as pt states she is using her cane and has family to assist when needed. Currently with a visiting daughter and a daughter who lives local if she is need of assistance. Pt verify transportation to all her medical appointments and ongoing adherence with her prescription medications with a recent refill (no problems). Pt states more energy and able to continue her dialysis throughout the week with no delays or reported issues. Pt also states she will be applying her cardiac heart monitor today for the next 30 days for ongoing monitoring of her bradycardia issues. RN strongly encouraged pt to apply as instructed and verified pt has a contact number for detail instructions if she runs into problems (pt verified).   Plan of care reviewed and discussed with goals and interventions adjusted and added accordingly based upon pt's progress and ongoing goals of care. RN offered to continue to follow up next week with a transition of care contact as pt was very receptive.   THN CM Care Plan Problem One     Most Recent Value  Care Plan Problem One  Recent hospitalization related to Bradycardia  Role Documenting the Problem One  Care Management [place] for Problem One  Active  THN Long Term Goal   Pt will not have a readmission over the next 31 days.  THN Long Term Goal Start Date  01/20/19  THN CM Short Term Goal #1   Adherence with post op pending medical appointments over the next 30 days.  THN CM Short Term  Goal #1 Start Date  01/20/19  Interventions for Short Term Goal #1  Will strongly encourage ongoing adherence with attending all medical appointments to prevent acute events. Will extend to allow ongoing adherence with pending upcoming appointments.  THN CM Short Term Goal #2   Adherence with all prescribed medications in the next 30 days.  THN CM Short Term Goal #2 Start Date  01/20/19  THN CM Short Term Goal #2 Met Date  02/09/19  THN CM Short Term Goal #3  Pt will apply the 30 cardiac monitoring for the next 30 days.  THN CM Short Term Goal #3 Start Date  02/09/19  Interventions for Short Tern Goal #3  Discussed the cardiac monitor and encouraged pt to apply the device today as instructed. Pt will comply with the recommendations and contact services based upon the number provided on the device if issues are encountered.      Raina Mina, RN Care Management Coordinator [place] Office [phone]

## 2019-02-10 ENCOUNTER — Other Ambulatory Visit: Payer: Self-pay

## 2019-02-10 ENCOUNTER — Observation Stay (HOSPITAL_COMMUNITY)
Admission: EM | Admit: 2019-02-10 | Discharge: 2019-02-12 | Disposition: A | Discharge status: Home / Self Care | Payer: Medicare HMO | Attending: Internal Medicine | Admitting: Internal Medicine

## 2019-02-10 ENCOUNTER — Encounter (HOSPITAL_COMMUNITY): Payer: Self-pay

## 2019-02-10 ENCOUNTER — Emergency Department (HOSPITAL_COMMUNITY): Payer: Medicare HMO

## 2019-02-10 DIAGNOSIS — Z955 Presence of coronary angioplasty implant and graft: Secondary | ICD-10-CM | POA: Insufficient documentation

## 2019-02-10 DIAGNOSIS — N186 End stage renal disease: Secondary | ICD-10-CM | POA: Diagnosis not present

## 2019-02-10 DIAGNOSIS — R000 Tachycardia, unspecified: Secondary | ICD-10-CM | POA: Diagnosis not present

## 2019-02-10 DIAGNOSIS — D509 Iron deficiency anemia, unspecified: Secondary | ICD-10-CM | POA: Diagnosis not present

## 2019-02-10 DIAGNOSIS — E785 Hyperlipidemia, unspecified: Secondary | ICD-10-CM | POA: Diagnosis not present

## 2019-02-10 DIAGNOSIS — I214 Non-ST elevation (NSTEMI) myocardial infarction: Principal | ICD-10-CM | POA: Diagnosis present

## 2019-02-10 DIAGNOSIS — I2510 Atherosclerotic heart disease of native coronary artery without angina pectoris: Secondary | ICD-10-CM | POA: Diagnosis not present

## 2019-02-10 DIAGNOSIS — Z79899 Other long term (current) drug therapy: Secondary | ICD-10-CM | POA: Diagnosis not present

## 2019-02-10 DIAGNOSIS — Z7902 Long term (current) use of antithrombotics/antiplatelets: Secondary | ICD-10-CM | POA: Diagnosis not present

## 2019-02-10 DIAGNOSIS — Z794 Long term (current) use of insulin: Secondary | ICD-10-CM | POA: Diagnosis not present

## 2019-02-10 DIAGNOSIS — E119 Type 2 diabetes mellitus without complications: Secondary | ICD-10-CM | POA: Diagnosis not present

## 2019-02-10 DIAGNOSIS — M109 Gout, unspecified: Secondary | ICD-10-CM | POA: Diagnosis not present

## 2019-02-10 DIAGNOSIS — I132 Hypertensive heart and chronic kidney disease with heart failure and with stage 5 chronic kidney disease, or end stage renal disease: Secondary | ICD-10-CM | POA: Diagnosis not present

## 2019-02-10 DIAGNOSIS — Z992 Dependence on renal dialysis: Secondary | ICD-10-CM | POA: Diagnosis not present

## 2019-02-10 DIAGNOSIS — Z1159 Encounter for screening for other viral diseases: Secondary | ICD-10-CM | POA: Insufficient documentation

## 2019-02-10 DIAGNOSIS — R072 Precordial pain: Secondary | ICD-10-CM | POA: Diagnosis not present

## 2019-02-10 DIAGNOSIS — R079 Chest pain, unspecified: Secondary | ICD-10-CM | POA: Diagnosis not present

## 2019-02-10 DIAGNOSIS — I209 Angina pectoris, unspecified: Secondary | ICD-10-CM

## 2019-02-10 DIAGNOSIS — I10 Essential (primary) hypertension: Secondary | ICD-10-CM | POA: Diagnosis not present

## 2019-02-10 DIAGNOSIS — D631 Anemia in chronic kidney disease: Secondary | ICD-10-CM | POA: Diagnosis not present

## 2019-02-10 DIAGNOSIS — E1122 Type 2 diabetes mellitus with diabetic chronic kidney disease: Secondary | ICD-10-CM | POA: Insufficient documentation

## 2019-02-10 DIAGNOSIS — I25709 Atherosclerosis of coronary artery bypass graft(s), unspecified, with unspecified angina pectoris: Secondary | ICD-10-CM | POA: Diagnosis present

## 2019-02-10 DIAGNOSIS — K219 Gastro-esophageal reflux disease without esophagitis: Secondary | ICD-10-CM | POA: Diagnosis not present

## 2019-02-10 DIAGNOSIS — Z951 Presence of aortocoronary bypass graft: Secondary | ICD-10-CM | POA: Diagnosis not present

## 2019-02-10 DIAGNOSIS — Z20828 Contact with and (suspected) exposure to other viral communicable diseases: Secondary | ICD-10-CM | POA: Diagnosis not present

## 2019-02-10 DIAGNOSIS — I5042 Chronic combined systolic (congestive) and diastolic (congestive) heart failure: Secondary | ICD-10-CM | POA: Diagnosis present

## 2019-02-10 DIAGNOSIS — I252 Old myocardial infarction: Secondary | ICD-10-CM | POA: Diagnosis not present

## 2019-02-10 DIAGNOSIS — Z7982 Long term (current) use of aspirin: Secondary | ICD-10-CM | POA: Diagnosis not present

## 2019-02-10 DIAGNOSIS — E1169 Type 2 diabetes mellitus with other specified complication: Secondary | ICD-10-CM | POA: Diagnosis present

## 2019-02-10 DIAGNOSIS — N2581 Secondary hyperparathyroidism of renal origin: Secondary | ICD-10-CM | POA: Diagnosis not present

## 2019-02-10 DIAGNOSIS — E1129 Type 2 diabetes mellitus with other diabetic kidney complication: Secondary | ICD-10-CM | POA: Diagnosis present

## 2019-02-10 LAB — CBC WITH DIFFERENTIAL/PLATELET
Abs Immature Granulocytes: 0.01 10*3/uL (ref 0.00–0.07)
Basophils Absolute: 0 10*3/uL (ref 0.0–0.1)
Basophils Relative: 1 %
Eosinophils Absolute: 0.1 10*3/uL (ref 0.0–0.5)
Eosinophils Relative: 2 %
HCT: 36.9 % (ref 36.0–46.0)
Hemoglobin: 11.5 g/dL — ABNORMAL LOW (ref 12.0–15.0)
Immature Granulocytes: 0 %
Lymphocytes Relative: 27 %
Lymphs Abs: 1.2 10*3/uL (ref 0.7–4.0)
MCH: 29.4 pg (ref 26.0–34.0)
MCHC: 31.2 g/dL (ref 30.0–36.0)
MCV: 94.4 fL (ref 80.0–100.0)
Monocytes Absolute: 0.5 10*3/uL (ref 0.1–1.0)
Monocytes Relative: 11 %
Neutro Abs: 2.6 10*3/uL (ref 1.7–7.7)
Neutrophils Relative %: 59 %
Platelets: 179 10*3/uL (ref 150–400)
RBC: 3.91 MIL/uL (ref 3.87–5.11)
RDW: 18.6 % — ABNORMAL HIGH (ref 11.5–15.5)
WBC: 4.4 10*3/uL (ref 4.0–10.5)
nRBC: 0 % (ref 0.0–0.2)

## 2019-02-10 LAB — TROPONIN I (HIGH SENSITIVITY)
Troponin I (High Sensitivity): 32 ng/L — ABNORMAL HIGH (ref <18)
Troponin I (High Sensitivity): 64 ng/L — ABNORMAL HIGH (ref <18)
Troponin I (High Sensitivity): 264 ng/L (ref <18)

## 2019-02-10 LAB — COMPREHENSIVE METABOLIC PANEL
ALT: 24 U/L (ref 0–44)
AST: 31 U/L (ref 15–41)
Albumin: 3.5 g/dL (ref 3.5–5.0)
Alkaline Phosphatase: 84 U/L (ref 38–126)
Anion gap: 13 (ref 5–15)
BUN: 21 mg/dL (ref 8–23)
CO2: 24 mmol/L (ref 22–32)
Calcium: 8.6 mg/dL — ABNORMAL LOW (ref 8.9–10.3)
Chloride: 100 mmol/L (ref 98–111)
Creatinine, Ser: 3.2 mg/dL — ABNORMAL HIGH (ref 0.44–1.00)
GFR calc Af Amer: 14 mL/min — ABNORMAL LOW (ref >60)
GFR calc non Af Amer: 12 mL/min — ABNORMAL LOW (ref >60)
Glucose, Bld: 118 mg/dL — ABNORMAL HIGH (ref 70–99)
Potassium: 4.3 mmol/L (ref 3.5–5.1)
Sodium: 137 mmol/L (ref 135–145)
Total Bilirubin: 0.6 mg/dL (ref 0.3–1.2)
Total Protein: 6.8 g/dL (ref 6.5–8.1)

## 2019-02-10 LAB — GLUCOSE, CAPILLARY: Glucose-Capillary: 169 mg/dL — ABNORMAL HIGH (ref 70–99)

## 2019-02-10 LAB — TSH: TSH: 2.019 u[IU]/mL (ref 0.350–4.500)

## 2019-02-10 LAB — SARS CORONAVIRUS 2 BY RT PCR (HOSPITAL ORDER, PERFORMED IN ~~LOC~~ HOSPITAL LAB): SARS Coronavirus 2: NEGATIVE

## 2019-02-10 MED ORDER — BRIMONIDINE TARTRATE 0.15 % OP SOLN
1.0000 [drp] | Freq: Two times a day (BID) | OPHTHALMIC | Status: DC
  Administered 2019-02-10 – 2019-02-12 (×4): 1 [drp] via OPHTHALMIC
  Filled 2019-02-10: qty 5

## 2019-02-10 MED ORDER — HYDROXYZINE HCL 25 MG PO TABS: 25.0000 mg | ORAL_TABLET | Freq: Three times a day (TID) | ORAL | Status: DC | PRN

## 2019-02-10 MED ORDER — DOCUSATE SODIUM 283 MG RE ENEM
1.0000 | ENEMA | RECTAL | Status: DC | PRN
  Filled 2019-02-10: qty 1

## 2019-02-10 MED ORDER — ZOLPIDEM TARTRATE 5 MG PO TABS: 5.0000 mg | ORAL_TABLET | Freq: Every evening | ORAL | Status: DC | PRN

## 2019-02-10 MED ORDER — ALLOPURINOL 100 MG PO TABS
100.0000 mg | ORAL_TABLET | Freq: Two times a day (BID) | ORAL | Status: DC
  Administered 2019-02-10 – 2019-02-12 (×4): 100 mg via ORAL
  Filled 2019-02-10 (×4): qty 1

## 2019-02-10 MED ORDER — HEPARIN SODIUM (PORCINE) 5000 UNIT/ML IJ SOLN
5000.0000 [IU] | Freq: Three times a day (TID) | INTRAMUSCULAR | Status: DC
  Administered 2019-02-10: 5000 [IU] via SUBCUTANEOUS
  Filled 2019-02-10 (×2): qty 1

## 2019-02-10 MED ORDER — PANTOPRAZOLE SODIUM 20 MG PO TBEC
20.0000 mg | DELAYED_RELEASE_TABLET | Freq: Every day | ORAL | Status: DC
  Administered 2019-02-10 – 2019-02-11 (×2): 20 mg via ORAL
  Filled 2019-02-10 (×3): qty 1

## 2019-02-10 MED ORDER — CALCIUM CARBONATE ANTACID 1250 MG/5ML PO SUSP
500.0000 mg | Freq: Four times a day (QID) | ORAL | Status: DC | PRN
  Filled 2019-02-10: qty 5

## 2019-02-10 MED ORDER — ONDANSETRON HCL 4 MG/2ML IJ SOLN: 4.0000 mg | Freq: Four times a day (QID) | INTRAMUSCULAR | Status: DC | PRN

## 2019-02-10 MED ORDER — ATORVASTATIN CALCIUM 80 MG PO TABS
80.0000 mg | ORAL_TABLET | Freq: Every day | ORAL | Status: DC
  Administered 2019-02-10: 18:00:00 80 mg via ORAL
  Filled 2019-02-10: qty 1

## 2019-02-10 MED ORDER — INSULIN GLARGINE 100 UNIT/ML ~~LOC~~ SOLN
10.0000 [IU] | Freq: Every day | SUBCUTANEOUS | Status: DC
  Administered 2019-02-10 – 2019-02-11 (×2): 10 [IU] via SUBCUTANEOUS
  Filled 2019-02-10 (×3): qty 0.1

## 2019-02-10 MED ORDER — ONDANSETRON HCL 4 MG PO TABS: 4.0000 mg | ORAL_TABLET | Freq: Four times a day (QID) | ORAL | Status: DC | PRN

## 2019-02-10 MED ORDER — ALBUTEROL SULFATE (2.5 MG/3ML) 0.083% IN NEBU: 3.0000 mL | INHALATION_SOLUTION | Freq: Four times a day (QID) | RESPIRATORY_TRACT | Status: DC | PRN

## 2019-02-10 MED ORDER — NEPRO/CARBSTEADY PO LIQD: 237.0000 mL | Freq: Three times a day (TID) | ORAL | Status: DC | PRN

## 2019-02-10 MED ORDER — HYDRALAZINE HCL 25 MG PO TABS
25.0000 mg | ORAL_TABLET | Freq: Three times a day (TID) | ORAL | Status: DC
  Administered 2019-02-10 – 2019-02-12 (×4): 25 mg via ORAL
  Filled 2019-02-10 (×5): qty 1

## 2019-02-10 MED ORDER — LATANOPROST 0.005 % OP SOLN
1.0000 [drp] | Freq: Every day | OPHTHALMIC | Status: DC
  Administered 2019-02-10 – 2019-02-11 (×2): 1 [drp] via OPHTHALMIC
  Filled 2019-02-10: qty 2.5

## 2019-02-10 MED ORDER — ISOSORBIDE MONONITRATE ER 60 MG PO TB24
60.0000 mg | ORAL_TABLET | Freq: Every day | ORAL | Status: DC
  Administered 2019-02-10 – 2019-02-12 (×3): 60 mg via ORAL
  Filled 2019-02-10: qty 1
  Filled 2019-02-10: qty 2
  Filled 2019-02-10: qty 1

## 2019-02-10 MED ORDER — ISOSORBIDE MONONITRATE ER 30 MG PO TB24: 30.0000 mg | ORAL_TABLET | Freq: Every day | ORAL | Status: DC

## 2019-02-10 MED ORDER — MELATONIN 3 MG PO TABS
6.0000 mg | ORAL_TABLET | Freq: Every day | ORAL | Status: DC
  Administered 2019-02-10 – 2019-02-11 (×2): 6 mg via ORAL
  Filled 2019-02-10 (×3): qty 2

## 2019-02-10 MED ORDER — RENA-VITE PO TABS
1.0000 | ORAL_TABLET | Freq: Every day | ORAL | Status: DC
  Administered 2019-02-10 – 2019-02-11 (×2): 1 via ORAL
  Filled 2019-02-10 (×3): qty 1

## 2019-02-10 MED ORDER — CAMPHOR-MENTHOL 0.5-0.5 % EX LOTN
1.0000 "application " | TOPICAL_LOTION | Freq: Three times a day (TID) | CUTANEOUS | Status: DC | PRN
  Filled 2019-02-10: qty 222

## 2019-02-10 MED ORDER — FERRIC CITRATE 1 GM 210 MG(FE) PO TABS
210.0000 mg | ORAL_TABLET | Freq: Three times a day (TID) | ORAL | Status: DC
  Administered 2019-02-11 – 2019-02-12 (×2): 210 mg via ORAL
  Filled 2019-02-10 (×7): qty 1

## 2019-02-10 MED ORDER — SORBITOL 70 % SOLN
30.0000 mL | Status: DC | PRN
  Filled 2019-02-10: qty 30

## 2019-02-10 MED ORDER — HEPARIN (PORCINE) 25000 UT/250ML-% IV SOLN
900.0000 [IU]/h | INTRAVENOUS | Status: DC
  Administered 2019-02-10: 750 [IU]/h via INTRAVENOUS
  Filled 2019-02-10: qty 250

## 2019-02-10 MED ORDER — ACETAMINOPHEN 650 MG RE SUPP: 650.0000 mg | Freq: Four times a day (QID) | RECTAL | Status: DC | PRN

## 2019-02-10 MED ORDER — CLOPIDOGREL BISULFATE 75 MG PO TABS
75.0000 mg | ORAL_TABLET | Freq: Every day | ORAL | Status: DC
  Administered 2019-02-10 – 2019-02-12 (×3): 75 mg via ORAL
  Filled 2019-02-10 (×3): qty 1

## 2019-02-10 MED ORDER — INSULIN ASPART 100 UNIT/ML ~~LOC~~ SOLN
0.0000 [IU] | Freq: Three times a day (TID) | SUBCUTANEOUS | Status: DC
  Administered 2019-02-11: 2 [IU] via SUBCUTANEOUS

## 2019-02-10 MED ORDER — ASPIRIN 81 MG PO CHEW
81.0000 mg | CHEWABLE_TABLET | Freq: Every day | ORAL | Status: DC
  Administered 2019-02-10 – 2019-02-12 (×3): 81 mg via ORAL
  Filled 2019-02-10 (×3): qty 1

## 2019-02-10 MED ORDER — ACETAMINOPHEN 325 MG PO TABS: 650.0000 mg | ORAL_TABLET | Freq: Four times a day (QID) | ORAL | Status: DC | PRN

## 2019-02-10 NOTE — Progress Notes (Signed)
ANTICOAGULATION CONSULT NOTE - Initial Consult  Pharmacy Consult for heparin Indication: chest pain/ACS  No Known Allergies  Patient Measurements: Height: 5\' 2"  (157.5 cm) Weight: 162 lb 3.2 oz (73.6 kg) IBW/kg (Calculated) : 50.1  Vital Signs: Temp: 98.5 F (36.9 C) (08/06 2109) Temp Source: Oral (08/06 2109) BP: 162/69 (08/06 2109) Pulse Rate: 94 (08/06 2109)  Labs: Recent Labs    02/10/19 1141 02/10/19 1316 02/10/19 1955  HGB 11.5*  --   --   HCT 36.9  --   --   PLT 179  --   --   CREATININE 3.20*  --   --   TROPONINIHS 32* 64* 264*    Estimated Creatinine Clearance: 11.9 mL/min (A) (by C-G formula based on SCr of 3.2 mg/dL (H)).  Assessment: CC/HPI: 85 yo f presenting with CP  PMH: CAD CABG ESRD HD GERD HLD DM HF  Anticoag: none pta - iv hep for r/o acs  Heme/Onc: H&H 11.5/36.9, Plt 179  Goal of Therapy:  Heparin level 0.3-0.7 units/ml Monitor platelets by anticoagulation protocol: Yes   Plan:  DC sq heparin No bolus d/t above Heparin gtt 750 units/hr Daily HL CBC F/U Cards plans - likely medical management   Levester Fresh, PharmD, BCPS, BCCCP Clinical Pharmacist [phone]  Please check AMION for all [place] numbers  02/10/2019 10:25 PM

## 2019-02-10 NOTE — ED Triage Notes (Signed)
Per [facility], pt completed all but 10 minutes of dialysis and began having CP, took 2 nitros with relief. Denies n/v, diaphoresis. Hypertensive and hx of same. 188/95, HR 90, RR 18, 97% on RA, CBG 128. Initial 12 lead showed inferior septal depression that cleared up after nitro. Pain Free.

## 2019-02-10 NOTE — Consult Note (Addendum)
Cardiology Consultation:   Patient ID: Monica [name] MRN: [id number]; DOB: 05/10/1931  Admit date: 02/10/2019 Date of Consult: 02/10/2019  Primary Care Provider: Benito Mccreedy, MD Primary Cardiologist: Ena Dawley, MD  Primary Electrophysiologist:  None    Patient Profile:   Monica [name] is a 85 y.o. female with a hx of CAD s/p CABG in 2017 and recent hospitalization for NSTEMI last month w/ PCI , chronic systolic HF, sinus node dysfunction, ESRD on HD (T,TH,Sat), GERD, HLD and DM who is being seen today for the evaluation of chest pain and elevated Hs Trop at the request of Dr. Lorin Mercy, Internal Medicine.   History of Present Illness:   Monica [name] is followed by Dr. [name]. Has known CAD status post CABG 09/2015. She was recently admitted in early July for NSTEMI (Hs Trop trend 222>>220>>241) 01/07/2019 with cath showing occluded/ atretic SVG to the distal RCA, severe proximal to stent and in-stent restenosis of SVG to OM with successful PTCA/with overlapping DES stent and successful DES to the ostial proximal SVG to the RI.  Echo showed new reduced LVEF 30 to 35% with moderately dilated right and left atrium moderate MR and mild AI. She was placed on ASA and Plavix post PCI. Also started on metoprolol. She also has HLD, GERD, DM and ESRD on HD, SVT and bradycardia (sinus node dysfunction).   She was readmitted in mid July and we were consulted again. Consult 7/14 was for dizziness and fatigue. Her HR was in the 40s. EKG showed a junctional rhythm HR 48 with poor R wave progression. She was seen by Dr. Tamala Julian. Her metoprolol was discontinued. Hs troponins were also cycled that admit and were down from recent NSTEMI but still abnormal at 39>>39>>53. This was felt not to be ACS but likely related to CAD. She had no chest pain.  She was at HD earlier today when she developed SSCP that was relieved w/ SL NTG x 2 and came to the ED. She reports CP started about 3 hr into HD. She asked her RN for SL NTG.  No relief after 1st but complete resolution after the 2nd. Her pain was substernal and felt like pressure. No radiation. Similar to previous angina but not as severe as her recent MI pain. No associated dyspnea, nausea, or diaphoresis. This was her first recurrence of CP since her stent placement and she reports full compliance w/ ASA and Plavix. She remains CP free in the ED. Hs troponin mildly elevated x 2 at 32>>64.   Heart Pathway Score:     Past Medical History:  Diagnosis Date  . Asthma   . Bilateral pleural effusion    a. s/p CABG >> s/p bilat thoracentesis 11/2015  . Candida infection 10/2015   Bilateral breasts  . Chronic diastolic CHF    Echo 11/02 [facility]):  EF 55-65, normal wall motion, normal diastolic function  . Coronary artery disease    LHC 3/17 >> 3 v CAD >> CABG // MV 01/2018:  Abnormal, low risk stress nuclear study with significant breast attenuation; cannot R/O prior infarct; no ischemia; EF 56 with normal wall motion. // s/p NSTEMI 11/19 [facility]) >> LHC:  dLM 90, pLAD 100, pLCx 100, mRCA 100, L-LAD ok, S-Dx prox 70, S-OM dist 99 (ulc); S-PDA 100 >>PCI: 2.25 x 24 mm Synergy DES to S-OM //  EF normal by Echo 11/19  . ESRD (end stage renal disease)    Dialysis Tu, Th, Sa  . Essential hypertension   .  Gastroesophageal reflux disease   . History of blood transfusion   . History of echocardiogram    a. Echo 3/17: Mild focal basal septal hypertrophy, vigorous LVF, EF 65-70%, normal wall motion, grade 1 diastolic dysfunction, MAC  . Hyperlipidemia   . Long Term Cardiac Monitor 3-14 day    LT Monitor 09/2018: SR, Sinus Tach, 6 beats NSVT, 38 runs of SVT (brief); no AFib.  . Mass   . Peptic ulcer   . Protein calorie malnutrition ([place])   . Type 2 diabetes mellitus ([place])     Past Surgical History:  Procedure Laterality Date  . ABDOMINAL HYSTERECTOMY    . CARDIAC CATHETERIZATION N/A 09/17/2015   Procedure: Left Heart Cath and Coronary Angiography;  Surgeon:  Jettie Booze, MD;  Location: [place] CV LAB;  Service: Cardiovascular;  Laterality: N/A;  . COLON SURGERY    . CORONARY ARTERY BYPASS GRAFT N/A 09/19/2015   Procedure: CORONARY ARTERY BYPASS GRAFTING (CABG) TIMES FOUR USING BILATARAL SAPHENOUS VEIN GRAFTS AND LEFT INTERNAL MAMMARY ARTERY;  Surgeon: [name], MD;  Location: [place];  Service: Open Heart Surgery;  Laterality: N/A;  . CORONARY STENT INTERVENTION N/A 01/10/2019   Procedure: CORONARY STENT INTERVENTION;  Surgeon: Leonie Man, MD;  Location: [place] CV LAB;  Service: Cardiovascular;  Laterality: N/A;  . LEFT HEART CATH AND CORS/GRAFTS ANGIOGRAPHY N/A 01/10/2019   Procedure: LEFT HEART CATH AND CORS/GRAFTS ANGIOGRAPHY;  Surgeon: Leonie Man, MD;  Location: [place] CV LAB;  Service: Cardiovascular;  Laterality: N/A;  . TEE WITHOUT CARDIOVERSION N/A 09/19/2015   Procedure: TRANSESOPHAGEAL ECHOCARDIOGRAM (TEE);  Surgeon: [name], MD;  Location: [place];  Service: Open Heart Surgery;  Laterality: N/A;     Home Medications:  Prior to Admission medications   Medication Sig Start Date End Date Taking? Authorizing Provider  acetaminophen (TYLENOL) 500 MG tablet Take 1,000 mg by mouth every 6 (six) hours as needed for mild pain or headache.   Yes [provider]  albuterol (PROVENTIL HFA;VENTOLIN HFA) 108 (90 Base) MCG/ACT inhaler Inhale 2 puffs into the lungs every 6 (six) hours as needed for wheezing or shortness of breath.   Yes [provider]  allopurinol (ZYLOPRIM) 100 MG tablet Take 100 mg by mouth 2 (two) times daily.    Yes [provider]  aspirin EC 81 MG EC tablet Take 1 tablet (81 mg total) by mouth daily. 10/03/15  Yes Barrett, Erin R, PA-C  atorvastatin (LIPITOR) 80 MG tablet Take 80 mg by mouth daily at 6 PM.  07/12/18  Yes [provider]  brimonidine (ALPHAGAN P) 0.1 % SOLN Place 1 drop into both eyes 2 (two) times daily.   Yes [provider]   Cholecalciferol (VITAMIN D-3) 1000 units CAPS Take 1,000 Units by mouth daily.    Yes [provider]  clopidogrel (PLAVIX) 75 MG tablet Take 75 mg by mouth daily.    Yes [provider]  diphenhydrAMINE (BENADRYL) 25 MG tablet Take 25 mg by mouth as needed for itching or allergies.    Yes [provider]  ferric citrate (AURYXIA) 1 GM 210 MG(Fe) tablet Take 210 mg by mouth 3 (three) times daily with meals.    Yes [provider]  hydrALAZINE (APRESOLINE) 25 MG tablet Take 1 tablet (25 mg total) by mouth 3 (three) times daily. 02/07/19  Yes Dorothy Spark, MD  insulin glargine (LANTUS) 100 UNIT/ML injection Inject 10 Units into the skin at bedtime.  Yes [provider]  isosorbide mononitrate (IMDUR) 30 MG 24 hr tablet Take 30 mg by mouth daily.   Yes [provider]  latanoprost (XALATAN) 0.005 % ophthalmic solution Place 1 drop into both eyes at bedtime. 07/05/18  Yes [provider]  loratadine (CLARITIN) 10 MG tablet Take 10 mg by mouth as needed for allergies.    Yes [provider]  Melatonin 5 MG CAPS Take 5 mg by mouth at bedtime.    Yes [provider]  multivitamin (RENA-VIT) TABS tablet Take 1 tablet by mouth daily.   Yes [provider]  nitroGLYCERIN (NITROSTAT) 0.4 MG SL tablet PLACE 1 TABLET BY MOUTH UNDER THE TONGUE EVERY 5 MINUTES AS NEEDED FOR CHEST PAIN. FOR UP TO 3 DOSES. IF NO RELIEF CALL 911 Patient taking differently: Place 0.4 mg under the tongue every 5 (five) minutes as needed for chest pain. FOR UP TO 3 DOSES. IF NO RELIEF CALL 911 07/21/18  Yes Dorothy Spark, MD  pantoprazole (PROTONIX) 20 MG tablet Take 1 tablet (20 mg total) by mouth daily. Patient taking differently: Take 20 mg by mouth at bedtime.  01/26/19  Yes Imogene Burn, PA-C    Inpatient Medications: Scheduled Meds:  Continuous Infusions:  PRN Meds:   Allergies:   No Known Allergies  Social History:    Social History   Socioeconomic History  . Marital status: Widowed    Spouse name: Not on file  . Number of children: Not on file  . Years of education: Not on file  . Highest education level: Not on file  Occupational History  . Occupation: Retired  Scientific laboratory technician  . Financial resource strain: Not hard at all  . Food insecurity    Worry: Never true    Inability: Never true  . Transportation needs    Medical: No    Non-medical: No  Tobacco Use  . Smoking status: Never Smoker  . Smokeless tobacco: Former Systems developer    Types: Snuff  Substance and Sexual Activity  . Alcohol use: No    Alcohol/week: 0.0 standard drinks  . Drug use: No  . Sexual activity: Not on file  Lifestyle  . Physical activity    Days per week: Not on file    Minutes per session: Not on file  . Stress: Not on file  Relationships  . Social connections    Talks on phone: More than three times a week    Gets together: More than three times a week    Attends religious service: More than 4 times per year    Active member of club or organization: No    Attends meetings of clubs or organizations: Never    Relationship status: Widowed  . Intimate partner violence    Fear of current or ex partner: Not on file    Emotionally abused: Not on file    Physically abused: Not on file    Forced sexual activity: Not on file  Other Topics Concern  . Not on file  Social History Narrative  . Not on file    Family History:    Family History  Problem Relation Age of Onset  . Heart attack Father      ROS:  Please see the history of present illness.   All other ROS reviewed and negative.     Physical Exam/Data:   Vitals:   02/10/19 1322 02/10/19 1415 02/10/19 1430 02/10/19 1542  BP: (!) 174/68 (!) 161/70 (!) 165/74 Marland Kitchen)  164/70  Pulse: 91   91  Resp: _0 Temp:      TempSrc:      SpO2: 98%   100%   No intake or output data in the 24 hours ending 02/10/19 1640 Last 3 Weights 01/26/2019 01/19/2019 01/18/2019   Weight (lbs) 165 lb 160 lb 4.8 oz 171 lb 8.3 oz  Weight (kg) 74.844 kg 72.712 kg 77.8 kg     There is no height or weight on file to calculate BMI.  General:  Elderly AAF Well nourished, well developed, in no acute distress HEENT: normal Lymph: no adenopathy Neck: no JVD Endocrine:  No thryomegaly Vascular: No carotid bruits; FA pulses 2+ bilaterally without bruits  Cardiac:  normal S1, S2; RRR; no murmur  Lungs:  clear to auscultation bilaterally, no wheezing, rhonchi or rales  Abd: soft, nontender, no hepatomegaly  Ext: no edema Musculoskeletal:  No deformities, BUE and BLE strength normal and equal Skin: warm and dry  Neuro:  CNs 2-12 intact, no focal abnormalities noted Psych:  Normal affect   EKG:  The EKG was personally reviewed and demonstrates:  NSR 95 bpm w/ repol abnormalties  Telemetry:  Telemetry was personally reviewed and demonstrates:  NSR 94 w/ PVCs  Relevant CV Studies: LHC 01/10/19  Conclusion    LV end diastolic pressure is normal.  Dist LM to Prox LAD lesion is 30% stenosed.  Mid LAD-1 lesion is 95% stenosed. Mid LAD-2 lesion is 100% stenosed.  Ramus lesion is 100% stenosed.  Ost Cx to Mid Cx lesion is 100% stenosed.  Prox RCA lesion is 70% stenosed. Prox RCA to Mid RCA lesion is 90% stenosed. Mid RCA lesion is 95% stenosed. Dist RCA lesion is 100% stenosed with 100% stenosed side branch in RPAV.  -------------GRAFTS--------------  LIMA-LAD graft was visualized by angiography and is normal in caliber. There is no competitive flow  SVG-dRCA graft was visualized by angiography. The graft exhibits severe diffuse disease. Origin to Prox Graft lesion is 90% stenosed. Prox Graft to Mid Graft lesion is 80% stenosed follwed by Dist Graft lesion is 100% stenosed.  -----------INTERVENTION----------------  SVG-OM graft was visualized by angiography.  Mid Graft lesion is 75% stenosed.  A drug-eluting stent was successfully placed using a STENT SYNERGY DES  2.5X12. Postdilated 2.8 mm  ---------------------------  Mid Graft to Dist Graft Stent is 85% stenosed. Balloon angioplasty was performed using a BALLOON [place].  Post intervention, there is a 0% residual stenosis -in both the new stent, stent overlap in the previous stent.  SVG-RI graft was visualized by angiography. Origin to Prox Graft lesion is 85% stenosed.  A drug-eluting stent was successfully placed using a STENT SYNERGY DES 2.5X20. -Postdilated to 2.7 mm  Post intervention, there is a 0% residual stenosis.   SUMMARY  Severe native vessel CAD: 100% ostial Circumflex (LCx) and ostial Ramus Intermedius, diffuse calcified proximal LAD followed by 95% stenosis just after 1st Diag followed by 100% -all CTO; severe diffuse proximal to mid RCA disease with CTO of the distal RCA  Essentially occluded/atretic SVG-dRCA  Severe proximal to stent and in-stent restenosis in SVG-OM ? Successful PTCA followed by DES PCI with overlapping DES stent (Synergy 2.5 mm x 12 mm - 2.8 mm)  Severe ostial-proximal SVG-RI ? Successful DES PCI (Synergy 2.5 mm x 20 mm--2.7 mm)  Normal LVEDP despite significant systemic hypertension   Diagnostic Dominance: Right  Intervention     2D Echo 01/08/19 IMPRESSIONS  1. The left ventricle has moderate-severely reduced systolic function, with an ejection fraction of 30-35%. The cavity size was normal. There is moderate concentric left ventricular hypertrophy. Left ventricular diastolic Doppler parameters are  consistent with pseudonormalization. Elevated left atrial and left ventricular end-diastolic pressures.  2. The right ventricle has moderately reduced systolic function. The cavity was normal. There is no increase in right ventricular wall thickness. Right ventricular systolic pressure is normal.  3. Left atrial size was moderately dilated.  4. Right atrial size was mildly dilated.  5. The mitral valve is degenerative. Moderate  thickening of the mitral valve leaflet. There is mild mitral annular calcification present. Mitral valve regurgitation is moderate by color flow Doppler.  6. The aortic valve is tricuspid. Mild thickening of the aortic valve. Aortic valve regurgitation is mild by color flow Doppler.   Laboratory Data:  High Sensitivity Troponin:   Recent Labs  Lab 01/17/19 2133 01/17/19 2248 01/18/19 0311 02/10/19 1141 02/10/19 1316  TROPONINIHS 34* 39* 53* 32* 64*     Cardiac EnzymesNo results for input(s): TROPONINI in the last 168 hours. No results for input(s): TROPIPOC in the last 168 hours.  Chemistry Recent Labs  Lab 02/10/19 1141  NA 137  K 4.3  CL 100  CO2 24  GLUCOSE 118*  BUN 21  CREATININE 3.20*  CALCIUM 8.6*  GFRNONAA 12*  GFRAA 14*  ANIONGAP 13    Recent Labs  Lab 02/10/19 1141  PROT 6.8  ALBUMIN 3.5  AST 31  ALT 24  ALKPHOS 84  BILITOT 0.6   Hematology Recent Labs  Lab 02/10/19 1141  WBC 4.4  RBC 3.91  HGB 11.5*  HCT 36.9  MCV 94.4  MCH 29.4  MCHC 31.2  RDW 18.6*  PLT 179   BNPNo results for input(s): BNP, PROBNP in the last 168 hours.  DDimer No results for input(s): DDIMER in the last 168 hours.   Radiology/Studies:  Dg Chest 2 View  Result Date: 02/10/2019 CLINICAL DATA:  85 year old female with chest pain after dialysis. EXAM: CHEST - 2 VIEW COMPARISON:  01/17/2019 and earlier. FINDINGS: PA and lateral views. Stable right chest dialysis catheter. Stable cardiac size and mediastinal contours. Prior CABG. No pneumothorax or pulmonary edema. Small bilateral pleural effusions appear stable since July. No other confluent pulmonary opacity. Exaggerated thoracic kyphosis. Osteopenia. Negative visible bowel gas pattern. IMPRESSION: Stable small bilateral pleural effusions since July. No new cardiopulmonary abnormality. Electronically Signed   By: Genevie Ann M.D.   On: 02/10/2019 12:36    Assessment and Plan:   Kylah Maresh is a 86 y.o. female with a hx of CAD  s/p CABG in 2017 and recent hospitalization for NSTEMI last month w/ PCI , chronic systolic HF, sinus node dysfunction, ESRD on HD (T,TH,Sat), GERD, HLD and DM who is being seen today for the evaluation of chest pain and elevated Hs Trop at the request of Dr. Lorin Mercy, Internal Medicine.   1. Chest Pain: SSCP during HD relieved w/ SL NTG x 2. Currently CP free. Recent admit for NSTEMI 4 weeks ago and got PCI +DES to SVG-OM and SVG-RI. Also severe diffuse proximal to mid RCA disease with CTO of the distal RCA (treating medically). Hs trop that admit much higher than current, peaking at 241. Troponins were recycled on her 2nd admit 7/14 for symptomatic bradycardia and were improved down to 34>>39>>53. Her Hs troponin today is 32>>64. No acute ST changes. Suspect trop elevation likely due to CKD but given delta change  of 32, hospitalist will admit for overnight observation. Will continue to cycle Hs Trop. If flat trend, will continue medical management and can further titrate antianginals. Based on low initial trop elevation, would doubt acute in stent thrombosis. She has been fully compliant w/ ASA and Plavix. Can try further increasing Imdur to 60 mg. No  blocker due to h/o bradycardia. We will arrange NST in the am to r/o large area of ischemia.    For questions or updates, please contact [place] Please consult [email or web address] for contact info under     Signed, Lyda Jester, PA-C  02/10/2019 4:40 PM   Patient examined chart reviewed discussed care with patient daughter and PA Exam with chronically ill black female Dialysis catheter right subclavian Clear lungs soft SEM fairly new fistula in LUE abdomen soft no edema. SSCP possible angina but no indication for repeat cath with low enzymes and no acute ECG changes Favor admission with escalation of imdur dose. Lexiscan myovue in am to assess ischemic burden with known non revascularized RCA disease If ischemic burden low can consider continued  medical Rx   Jenkins Rouge

## 2019-02-10 NOTE — ED Notes (Signed)
ED TO INPATIENT HANDOFF REPORT  ED Nurse Name and Phone #: [phone]  S Name/Age/Gender Monica [name] 85 y.o. female Room/Bed: 014C/014C  Code Status   Code Status: Full Code  Home/SNF/Other Home Patient oriented to: self, place, time and situation Is this baseline? Yes   Triage Complete: Triage complete  Chief Complaint CP ABNORMAL EKG  Triage Note Per [facility], pt completed all but 10 minutes of dialysis and began having CP, took 2 nitros with relief. Denies n/v, diaphoresis. Hypertensive and hx of same. 188/95, HR 90, RR 18, 97% on RA, CBG 128. Initial 12 lead showed inferior septal depression that cleared up after nitro. Pain Free.   Allergies No Known Allergies  Level of Care/Admitting Diagnosis ED Disposition    ED Disposition Condition [facility] Area: [place] [[id number]]  Level of Care: Telemetry Cardiac [103]  I expect the patient will be discharged within 24 hours: No (not a candidate for 5C-Observation unit)  Covid Evaluation: Asymptomatic Screening Protocol (No Symptoms)  Diagnosis: Chest pain [id number]  Admitting Physician: Karmen Bongo [[id number]]  Attending Physician: Karmen Bongo [[id number]]  PT Class (Do Not Modify): Observation [104]  PT Acc Code (Do Not Modify): Observation [10022]       B Medical/Surgery History Past Medical History:  Diagnosis Date  . Asthma   . Chronic diastolic CHF    Echo [id number] [facility]):  EF 55-65, normal wall motion, normal diastolic function  . Coronary artery disease    LHC 3/17 >> 3 v CAD >> CABG // MV 01/2018:  Abnormal, low risk stress nuclear study with significant breast attenuation; cannot R/O prior infarct; no ischemia; EF 56 with normal wall motion. // s/p NSTEMI 11/19 [facility]) >> LHC:  dLM 90, pLAD 100, pLCx 100, mRCA 100, L-LAD ok, S-Dx prox 70, S-OM dist 99 (ulc); S-PDA 100 >>PCI: 2.25 x 24 mm Synergy DES to S-OM //  EF normal by Echo 11/19  . ESRD (end stage renal  disease)    Dialysis Tu, Th, Sa  . Essential hypertension   . Gastroesophageal reflux disease   . History of blood transfusion   . Hyperlipidemia   . Peptic ulcer   . Protein calorie malnutrition ([place])   . Type 2 diabetes mellitus ([place])    Past Surgical History:  Procedure Laterality Date  . ABDOMINAL HYSTERECTOMY    . CARDIAC CATHETERIZATION N/A 09/17/2015   Procedure: Left Heart Cath and Coronary Angiography;  Surgeon: Jettie Booze, MD;  Location: [facility];  Service: Cardiovascular;  Laterality: N/A;  . COLON SURGERY    . CORONARY ARTERY BYPASS GRAFT N/A 09/19/2015   Procedure: CORONARY ARTERY BYPASS GRAFTING (CABG) TIMES FOUR USING BILATARAL SAPHENOUS VEIN GRAFTS AND LEFT INTERNAL MAMMARY ARTERY;  Surgeon: [name], MD;  Location: [facility];  Service: Open Heart Surgery;  Laterality: N/A;  . CORONARY STENT INTERVENTION N/A 01/10/2019   Procedure: CORONARY STENT INTERVENTION;  Surgeon: Leonie Man, MD;  Location: [facility];  Service: Cardiovascular;  Laterality: N/A;  . LEFT HEART CATH AND CORS/GRAFTS ANGIOGRAPHY N/A 01/10/2019   Procedure: LEFT HEART CATH AND CORS/GRAFTS ANGIOGRAPHY;  Surgeon: Leonie Man, MD;  Location: [facility] [facility];  Service: Cardiovascular;  Laterality: N/A;  . TEE WITHOUT CARDIOVERSION N/A 09/19/2015   Procedure: TRANSESOPHAGEAL ECHOCARDIOGRAM (TEE);  Surgeon: [name], MD;  Location: [place];  Service: Open Heart Surgery;  Laterality: N/A;     A  IV Location/Drains/Wounds Patient Lines/Drains/Airways Status   Active Line/Drains/Airways    Name:   Placement date:   Placement time:   Site:   Days:   Peripheral IV 02/10/19 Right Antecubital   02/10/19    1542    Antecubital   less than 1   Fistula / Graft Left Upper arm Arteriovenous fistula   01/08/19    0157    Upper arm   33   Hemodialysis Catheter Right Subclavian Double lumen Temporary (Non-Tunneled)   01/08/19    0145    Subclavian   33   Incision (Closed)  09/19/15 Leg Left   09/19/15    1101     1240   Incision (Closed) 09/19/15 Leg Right   09/19/15    1101     1240   Incision (Closed) 09/19/15 Chest Other (Comment)   09/19/15    1101     1240          Intake/Output Last 24 hours No intake or output data in the 24 hours ending 02/10/19 1908  Labs/Imaging Results for orders placed or performed during the hospital encounter of 02/10/19 (from the past 48 hour(s))  Troponin I (High Sensitivity)     Status: Abnormal   Collection Time: 02/10/19 11:41 AM  Result Value Ref Range   Troponin I (High Sensitivity) 32 (H) <18 ng/L    Comment: (NOTE) Elevated high sensitivity troponin I (hsTnI) values and significant  changes across serial measurements may suggest ACS but many other  chronic and acute conditions are known to elevate hsTnI results.  Refer to the "Links" section for chest pain algorithms and additional  guidance. Performed at [facility], [place] [address] [place], [place] [address]   CBC with Differential     Status: Abnormal   Collection Time: 02/10/19 11:41 AM  Result Value Ref Range   WBC 4.4 4.0 - 10.5 K/uL   RBC 3.91 3.87 - 5.11 MIL/uL   Hemoglobin 11.5 (L) 12.0 - 15.0 g/dL   HCT 36.9 36.0 - 46.0 %   MCV 94.4 80.0 - 100.0 fL   MCH 29.4 26.0 - 34.0 pg   MCHC 31.2 30.0 - 36.0 g/dL   RDW 18.6 (H) 11.5 - 15.5 %   Platelets 179 150 - 400 K/uL   nRBC 0.0 0.0 - 0.2 %   Neutrophils Relative % 59 %   Neutro Abs 2.6 1.7 - 7.7 K/uL   Lymphocytes Relative 27 %   Lymphs Abs 1.2 0.7 - 4.0 K/uL   Monocytes Relative 11 %   Monocytes Absolute 0.5 0.1 - 1.0 K/uL   Eosinophils Relative 2 %   Eosinophils Absolute 0.1 0.0 - 0.5 K/uL   Basophils Relative 1 %   Basophils Absolute 0.0 0.0 - 0.1 K/uL   Immature Granulocytes 0 %   Abs Immature Granulocytes 0.01 0.00 - 0.07 K/uL    Comment: Performed at [facility] [address] [place], [place] [address]  Comprehensive metabolic panel     Status: Abnormal   Collection  Time: 02/10/19 11:41 AM  Result Value Ref Range   Sodium 137 135 - 145 mmol/L   Potassium 4.3 3.5 - 5.1 mmol/L   Chloride 100 98 - 111 mmol/L   CO2 24 22 - 32 mmol/L   Glucose, Bld 118 (H) 70 - 99 mg/dL   BUN 21 8 - 23 mg/dL   Creatinine, Ser 3.20 (H) 0.44 - 1.00 mg/dL   Calcium 8.6 (L)  8.9 - 10.3 mg/dL   Total Protein 6.8 6.5 - 8.1 g/dL   Albumin 3.5 3.5 - 5.0 g/dL   AST 31 15 - 41 U/L   ALT 24 0 - 44 U/L   Alkaline Phosphatase 84 38 - 126 U/L   Total Bilirubin 0.6 0.3 - 1.2 mg/dL   GFR calc non Af Amer 12 (L) >60 mL/min   GFR calc Af Amer 14 (L) >60 mL/min   Anion gap 13 5 - 15    Comment: Performed at [facility] [address], [place], [place] [address]  Troponin I (High Sensitivity)     Status: Abnormal   Collection Time: 02/10/19  1:16 PM  Result Value Ref Range   Troponin I (High Sensitivity) 64 (H) <18 ng/L    Comment: RESULT CALLED TO, READ BACK BY AND VERIFIED WITH: PULLIAM,P RN @1442  ON [id number] BY FLEMINGS (NOTE) Elevated high sensitivity troponin I (hsTnI) values and significant  changes across serial measurements may suggest ACS but many other  chronic and acute conditions are known to elevate hsTnI results.  Refer to the Links section for chest pain algorithms and additional  guidance. Performed at [facility], [place] [address] [place], [place] [address]   SARS Coronavirus 2 [facility] order, Performed in [facility] hospital lab) Nasopharyngeal Nasopharyngeal Swab     Status: None   Collection Time: 02/10/19  3:59 PM   Specimen: Nasopharyngeal Swab  Result Value Ref Range   SARS Coronavirus 2 NEGATIVE NEGATIVE    Comment: (NOTE) If result is NEGATIVE SARS-CoV-2 target nucleic acids are NOT DETECTED. The SARS-CoV-2 RNA is generally detectable in upper and lower  respiratory specimens during the acute phase of infection. The lowest  concentration of SARS-CoV-2 viral copies this assay can detect is 250  copies / mL. A negative result does not  preclude SARS-CoV-2 infection  and should not be used as the sole basis for treatment or other  patient management decisions.  A negative result may occur with  improper specimen collection / handling, submission of specimen other  than nasopharyngeal swab, presence of viral mutation(s) within the  areas targeted by this assay, and inadequate number of viral copies  (<250 copies / mL). A negative result must be combined with clinical  observations, patient history, and epidemiological information. If result is POSITIVE SARS-CoV-2 target nucleic acids are DETECTED. The SARS-CoV-2 RNA is generally detectable in upper and lower  respiratory specimens dur ing the acute phase of infection.  Positive  results are indicative of active infection with SARS-CoV-2.  Clinical  correlation with patient history and other diagnostic information is  necessary to determine patient infection status.  Positive results do  not rule out bacterial infection or co-infection with other viruses. If result is PRESUMPTIVE POSTIVE SARS-CoV-2 nucleic acids MAY BE PRESENT.   A presumptive positive result was obtained on the submitted specimen  and confirmed on repeat testing.  While 2019 novel coronavirus  (SARS-CoV-2) nucleic acids may be present in the submitted sample  additional confirmatory testing may be necessary for epidemiological  and / or clinical management purposes  to differentiate between  SARS-CoV-2 and other Sarbecovirus currently known to infect humans.  If clinically indicated additional testing with an alternate test  methodology [phone]) is advised. The SARS-CoV-2 RNA is generally  detectable in upper and lower respiratory sp ecimens during the acute  phase of infection. The expected result is Negative. Fact Sheet for Patients:  StrictlyIdeas.no Fact Sheet for  Healthcare Providers: BankingDealers.co.za This test is not yet approved or cleared by  the [place] and has been authorized for detection and/or diagnosis of SARS-CoV-2 by FDA under an Emergency Use Authorization (EUA).  This EUA will remain in effect (meaning this test can be used) for the duration of the COVID-19 declaration under Section 564(b)(1) of the Act, 21 U.S.C. section 360bbb-3(b)(1), unless the authorization is terminated or revoked sooner. Performed at [facility], [place] [address] [place], [place] [address]    Dg Chest 2 View  Result Date: 02/10/2019 CLINICAL DATA:  85 year old female with chest pain after dialysis. EXAM: CHEST - 2 VIEW COMPARISON:  01/17/2019 and earlier. FINDINGS: PA and lateral views. Stable right chest dialysis catheter. Stable cardiac size and mediastinal contours. Prior CABG. No pneumothorax or pulmonary edema. Small bilateral pleural effusions appear stable since July. No other confluent pulmonary opacity. Exaggerated thoracic kyphosis. Osteopenia. Negative visible bowel gas pattern. IMPRESSION: Stable small bilateral pleural effusions since July. No new cardiopulmonary abnormality. Electronically Signed   By: Genevie Ann M.D.   On: 02/10/2019 12:36    Pending Labs Unresulted Labs (From admission, onward)    Start     Ordered   02/11/19 0500  Lipid panel  Tomorrow morning,   R     02/10/19 1818   02/10/19 1811  TSH  Once,   STAT     02/10/19 1818          Vitals/Pain Today's Vitals   02/10/19 1415 02/10/19 1430 02/10/19 1542 02/10/19 1542  BP: (!) 161/70 (!) 165/74  (!) 164/70  Pulse:    91  Resp: 14 12  15   Temp:      TempSrc:      SpO2:    100%  PainSc:   0-No pain     Isolation Precautions No active isolations  Medications Medications  allopurinol (ZYLOPRIM) tablet 100 mg (has no administration in time range)  aspirin chewable tablet 81 mg (81 mg Oral Given 02/10/19 1749)  atorvastatin (LIPITOR) tablet 80 mg (80 mg Oral Given 02/10/19 1750)  hydrALAZINE (APRESOLINE) tablet 25 mg (25 mg Oral Given 02/10/19  1749)  insulin glargine (LANTUS) injection 10 Units (has no administration in time range)  ferric citrate (AURYXIA) tablet 210 mg (has no administration in time range)  pantoprazole (PROTONIX) EC tablet 20 mg (has no administration in time range)  clopidogrel (PLAVIX) tablet 75 mg (75 mg Oral Given 02/10/19 1750)  Melatonin CAPS 5 mg (has no administration in time range)  multivitamin (RENA-VIT) tablet 1 tablet (has no administration in time range)  albuterol (PROVENTIL) (2.5 MG/3ML) 0.083% nebulizer solution 3 mL (has no administration in time range)  brimonidine (ALPHAGAN) 0.15 % ophthalmic solution 1 drop (has no administration in time range)  latanoprost (XALATAN) 0.005 % ophthalmic solution 1 drop (has no administration in time range)  acetaminophen (TYLENOL) tablet 650 mg (has no administration in time range)    Or  acetaminophen (TYLENOL) suppository 650 mg (has no administration in time range)  zolpidem (AMBIEN) tablet 5 mg (has no administration in time range)  sorbitol 70 % solution 30 mL (has no administration in time range)  docusate sodium (ENEMEEZ) enema 283 mg (has no administration in time range)  ondansetron (ZOFRAN) tablet 4 mg (has no administration in time range)    Or  ondansetron (ZOFRAN) injection 4 mg (has no administration in time range)  camphor-menthol (SARNA) lotion 1 application (has no administration in time range)  And  hydrOXYzine (ATARAX/VISTARIL) tablet 25 mg (has no administration in time range)  calcium carbonate (dosed in mg elemental calcium) suspension 500 mg of elemental calcium (has no administration in time range)  feeding supplement (NEPRO CARB STEADY) liquid 237 mL (has no administration in time range)  insulin aspart (novoLOG) injection 0-9 Units (has no administration in time range)  heparin injection 5,000 Units (5,000 Units Subcutaneous Given 02/10/19 1751)  isosorbide mononitrate (IMDUR) 24 hr tablet 60 mg (60 mg Oral Given 02/10/19 1750)     Mobility walks Low fall risk   Focused Assessments Cardiac Assessment Handoff:  Cardiac Rhythm: Normal sinus rhythm Lab Results  Component Value Date   TROPONINI <0.03 09/15/2015   Lab Results  Component Value Date   DDIMER 3.73 (H) 10/19/2015   Does the Patient currently have chest pain? No     R Recommendations: See Admitting Provider Note  Report given to:   Additional Notes:

## 2019-02-10 NOTE — ED Notes (Signed)
Attempted report 

## 2019-02-10 NOTE — ED Notes (Signed)
Dinner tray ordered.

## 2019-02-10 NOTE — Progress Notes (Signed)
CRITICAL VALUE ALERT  Critical Value:  Troponin: 264  Date & Time Notied:  02/10/2019, 2214  Provider Notified: TRIAD  Orders Received/Actions taken: awaiting

## 2019-02-10 NOTE — Progress Notes (Signed)
Pt arrived onto the unit. Vitals taken. Skin assessment preformed. Pt has a high BP but the ED RN gave medication already at 1749. RN called MD Posey Pronto who said since pt BP is still high wait until 2300 and give the due dose of hydralazine. Will continue to monitor pt.

## 2019-02-10 NOTE — ED Notes (Signed)
Phlebotomy @ bedside  

## 2019-02-10 NOTE — H&P (Signed)
History and Physical    Monica [name] [id number] DOB: 06-15-1931 DOA: 02/10/2019  PCP: Benito Mccreedy, MD Consultants:  [name] - cardiology; Leazquez-Ramirez - vascular Patient coming from:  Home - lives alone, but daughter is visiting from [place] right now; [facility]: Daughter, [phone]  Chief Complaint: Chest pain  HPI: Monica [name] is a 85 y.o. female with medical history significant of DM; HLD; HTN; ESRD on TTS HD; CAD s/p CABG; and chronic diastolic CHF presenting with chest pain.  The patient was in HD today and was almost done when she started feeling bad.  She developed substernal CP.  She took her own NTG tabs x 2 with improvement.  The pain recurred and she took 2 more NTG in the ambulance with resolution of the pain.  This pain was different from her prior MI.    Her last cath was 01/10/19 with severe multivessel disease including severe diffuse disease in the graft.  She had 2 DES placed with plan to treat the occluded RCA system medically.  ED Course:  Chest pain, increased troponin. Pain started at the end of HD session, improved with NTG x 2 and resolved with more.  Troponin 32 (normal), increased to 64.    Review of Systems: As per HPI; otherwise review of systems reviewed and negative.   Ambulatory Status:  Ambulates without assistance or with a cane  Past Medical History:  Diagnosis Date  . Asthma   . Chronic diastolic CHF    Echo [id number] [facility]):  EF 55-65, normal wall motion, normal diastolic function  . Coronary artery disease    LHC 3/17 >> 3 v CAD >> CABG // MV 01/2018:  Abnormal, low risk stress nuclear study with significant breast attenuation; cannot R/O prior infarct; no ischemia; EF 56 with normal wall motion. // s/p NSTEMI 11/19 [facility]) >> LHC:  dLM 90, pLAD 100, pLCx 100, mRCA 100, L-LAD ok, S-Dx prox 70, S-OM dist 99 (ulc); S-PDA 100 >>PCI: 2.25 x 24 mm Synergy DES to S-OM //  EF normal by Echo 11/19  . ESRD (end stage renal disease)    Dialysis  Tu, Th, Sa  . Essential hypertension   . Gastroesophageal reflux disease   . History of blood transfusion   . Hyperlipidemia   . Peptic ulcer   . Protein calorie malnutrition ([place])   . Type 2 diabetes mellitus ([place])     Past Surgical History:  Procedure Laterality Date  . ABDOMINAL HYSTERECTOMY    . CARDIAC CATHETERIZATION N/A 09/17/2015   Procedure: Left Heart Cath and Coronary Angiography;  Surgeon: Jettie Booze, MD;  Location: [place] [facility];  Service: Cardiovascular;  Laterality: N/A;  . COLON SURGERY    . CORONARY ARTERY BYPASS GRAFT N/A 09/19/2015   Procedure: CORONARY ARTERY BYPASS GRAFTING (CABG) TIMES FOUR USING BILATARAL SAPHENOUS VEIN GRAFTS AND LEFT INTERNAL MAMMARY ARTERY;  Surgeon: [name], MD;  Location: [place];  Service: Open Heart Surgery;  Laterality: N/A;  . CORONARY STENT INTERVENTION N/A 01/10/2019   Procedure: CORONARY STENT INTERVENTION;  Surgeon: Leonie Man, MD;  Location: [place] [facility];  Service: Cardiovascular;  Laterality: N/A;  . LEFT HEART CATH AND CORS/GRAFTS ANGIOGRAPHY N/A 01/10/2019   Procedure: LEFT HEART CATH AND CORS/GRAFTS ANGIOGRAPHY;  Surgeon: Leonie Man, MD;  Location: [place] [facility];  Service: Cardiovascular;  Laterality: N/A;  . TEE WITHOUT CARDIOVERSION N/A 09/19/2015   Procedure: TRANSESOPHAGEAL ECHOCARDIOGRAM (TEE);  Surgeon: [name], MD;  Location: [facility];  Service: Open Heart Surgery;  Laterality: N/A;    Social History   Socioeconomic History  . Marital status: Widowed    Spouse name: Not on file  . Number of children: Not on file  . Years of education: Not on file  . Highest education level: Not on file  Occupational History  . Occupation: Retired  Scientific laboratory technician  . Financial resource strain: Not hard at all  . Food insecurity    Worry: Never true    Inability: Never true  . Transportation needs    Medical: No    Non-medical: No  Tobacco Use  . Smoking status: Never Smoker  .  Smokeless tobacco: Former Systems developer    Types: Snuff  Substance and Sexual Activity  . Alcohol use: No    Alcohol/week: 0.0 standard drinks  . Drug use: No  . Sexual activity: Not on file  Lifestyle  . Physical activity    Days per week: Not on file    Minutes per session: Not on file  . Stress: Not on file  Relationships  . Social connections    Talks on phone: More than three times a week    Gets together: More than three times a week    Attends religious service: More than 4 times per year    Active member of club or organization: No    Attends meetings of clubs or organizations: Never    Relationship status: Widowed  . Intimate partner violence    Fear of current or ex partner: Not on file    Emotionally abused: Not on file    Physically abused: Not on file    Forced sexual activity: Not on file  Other Topics Concern  . Not on file  Social History Narrative  . Not on file    No Known Allergies  Family History  Problem Relation Age of Onset  . Heart attack Father     Prior to Admission medications   Medication Sig Start Date End Date Taking? Authorizing Provider  acetaminophen (TYLENOL) 500 MG tablet Take 1,000 mg by mouth every 6 (six) hours as needed for mild pain or headache.   Yes [provider]  albuterol (PROVENTIL HFA;VENTOLIN HFA) 108 (90 Base) MCG/ACT inhaler Inhale 2 puffs into the lungs every 6 (six) hours as needed for wheezing or shortness of breath.   Yes [provider]  allopurinol (ZYLOPRIM) 100 MG tablet Take 100 mg by mouth 2 (two) times daily.    Yes [provider]  aspirin EC 81 MG EC tablet Take 1 tablet (81 mg total) by mouth daily. 10/03/15  Yes Barrett, Erin R, PA-C  atorvastatin (LIPITOR) 80 MG tablet Take 80 mg by mouth daily at 6 PM.  07/12/18  Yes [provider]  brimonidine (ALPHAGAN P) 0.1 % SOLN Place 1 drop into both eyes 2 (two) times daily.   Yes [provider]  Cholecalciferol (VITAMIN D-3)  1000 units CAPS Take 1,000 Units by mouth daily.    Yes [provider]  clopidogrel (PLAVIX) 75 MG tablet Take 75 mg by mouth daily.    Yes [provider]  diphenhydrAMINE (BENADRYL) 25 MG tablet Take 25 mg by mouth as needed for itching or allergies.    Yes [provider]  ferric citrate (AURYXIA) 1 GM 210 MG(Fe) tablet Take 210 mg by mouth 3 (three) times daily with meals.    Yes [provider]  hydrALAZINE (APRESOLINE) 25 MG  tablet Take 1 tablet (25 mg total) by mouth 3 (three) times daily. 02/07/19  Yes Dorothy Spark, MD  insulin glargine (LANTUS) 100 UNIT/ML injection Inject 10 Units into the skin at bedtime.    Yes [provider]  isosorbide mononitrate (IMDUR) 30 MG 24 hr tablet Take 30 mg by mouth daily.   Yes [provider]  latanoprost (XALATAN) 0.005 % ophthalmic solution Place 1 drop into both eyes at bedtime. 07/05/18  Yes [provider]  loratadine (CLARITIN) 10 MG tablet Take 10 mg by mouth as needed for allergies.    Yes [provider]  Melatonin 5 MG CAPS Take 5 mg by mouth at bedtime.    Yes [provider]  multivitamin (RENA-VIT) TABS tablet Take 1 tablet by mouth daily.   Yes [provider]  nitroGLYCERIN (NITROSTAT) 0.4 MG SL tablet PLACE 1 TABLET BY MOUTH UNDER THE TONGUE EVERY 5 MINUTES AS NEEDED FOR CHEST PAIN. FOR UP TO 3 DOSES. IF NO RELIEF CALL 911 Patient taking differently: Place 0.4 mg under the tongue every 5 (five) minutes as needed for chest pain. FOR UP TO 3 DOSES. IF NO RELIEF CALL 911 07/21/18  Yes Dorothy Spark, MD  pantoprazole (PROTONIX) 20 MG tablet Take 1 tablet (20 mg total) by mouth daily. Patient taking differently: Take 20 mg by mouth at bedtime.  01/26/19  Yes Imogene Burn, PA-C    Physical Exam: Vitals:   02/10/19 1322 02/10/19 1415 02/10/19 1430 02/10/19 1542  BP: (!) 174/68 (!) 161/70 (!) 165/74 (!) 164/70  Pulse: 91   91  Resp: 20 14 12  15   Temp:      TempSrc:      SpO2: 98%   100%     . General:  Appears calm and comfortable and is NAD . Eyes:  PERRL, EOMI, normal lids, iris . ENT:  grossly normal hearing, lips & tongue, mmm; artificial dentition . Neck:  no LAD, masses or thyromegaly . Cardiovascular:  RRR, no m/r/g. No LE edema.  Marland Kitchen Respiratory:   CTA bilaterally with no wheezes/rales/rhonchi.  Normal respiratory effort. . Abdomen:  soft, NT, ND, NABS . Back:   normal alignment, no CVAT . Skin:  no rash or induration seen on limited exam . Musculoskeletal:  grossly normal tone BUE/BLE, good ROM, no bony abnormality . Psychiatric:  grossly normal mood and affect, speech fluent and appropriate, AOx3 . Neurologic:  CN 2-12 grossly intact, moves all extremities in coordinated fashion    Radiological Exams on Admission: Dg Chest 2 View  Result Date: 02/10/2019 CLINICAL DATA:  85 year old female with chest pain after dialysis. EXAM: CHEST - 2 VIEW COMPARISON:  01/17/2019 and earlier. FINDINGS: PA and lateral views. Stable right chest dialysis catheter. Stable cardiac size and mediastinal contours. Prior CABG. No pneumothorax or pulmonary edema. Small bilateral pleural effusions appear stable since July. No other confluent pulmonary opacity. Exaggerated thoracic kyphosis. Osteopenia. Negative visible bowel gas pattern. IMPRESSION: Stable small bilateral pleural effusions since July. No new cardiopulmonary abnormality. Electronically Signed   By: Genevie Ann M.D.   On: 02/10/2019 12:36    EKG: Independently reviewed.  NSR with rate 96; prolonged QT 502; nonspecific ST changes with no evidence of acute ischemia   Labs on Admission: I have personally reviewed the available labs and imaging studies at the time of the admission.  Pertinent labs:   Glucose 118 BUN 21/Creatinine 3.20/GFR 12 HS troponin 32 -> 64 WBC 4.4 Hgb 11.5  Assessment/Plan Principal Problem:   Chest pain Active Problems:   Diabetes mellitus with  renal complications (HCC)   Essential hypertension   Chronic combined systolic and diastolic CHF (congestive heart failure) (HCC)   ESRD (end stage renal disease) ([place])   Hyperlipidemia associated with type 2 diabetes mellitus (HCC)   Chest pain with h/o CAD and recent stenting -Patient with substernal chest pain that came on at the end of HD today, resolved with NTG x 4 -2/3 typical symptoms suggestive of atypical cardiac chest pain.  -CXR unremarkable.   -Initial cardiac HS troponin negative but repeat was mildly increased and delta was 32; will continue to trend.  -EKG not indicative of acute ischemia.   -HEART pathway score is 5, indicating that the patient has an elevated risk score and requires further evaluation. -Will plan to place in observation status on telemetry to rule out ACS by overnight observation.  -Repeat HS troponin -Repeat EKG in AM -Continue ASA 81 mg daily -Risk factor stratification with FLP; will also check TSH  -Cardiology consultation requested -Will plan to start Heparin drip if enzymes are increasingly positive and/or chest pain recurs -Continue ASA, Plavix, Imdur  HTN -Takes hydralazine monotherapy at home  HLD -Continue Lipitor -Lipids were checked in 3/20 (TC 125, HDL 50, LDL 53, TG 109); will repeat  DM -Last A1c was 7.0 on 7/4 -Continue Lantus -Will cover with sensitive-scale SSI for now  ESRD on HD -Patient on chronic TTS HD -Nephrology prn order set utilized -She does not appear to be volume overloaded or otherwise in need of acute HD and just completed almost her entire session today -May need HD if she requires cath and/or remains hospitalized until Saturday  Chronic combined CHF -Appears compensated at this time -Weight/fluid management via dialysis -Echo on 7/4 with EF 99991111 and diastolic pseudonormalization    Note: This patient has been tested and is negative for the novel coronavirus COVID-19.   DVT prophylaxis: Lovenox   Code Status:  FULL - confirmed with patient Family Communication: Her daughter was present throughout evaluation Disposition Plan:  Home once clinically improved Consults called: Cardiology  Admission status: It is my clinical opinion that referral for OBSERVATION is reasonable and necessary in this patient based on the above information provided. The aforementioned taken together are felt to place the patient at high risk for further clinical deterioration. However it is anticipated that the patient may be medically stable for discharge from the hospital within 24 to 48 hours.      Karmen Bongo MD [facility]   How to contact the [facility] Attending or Consulting provider [place] or covering provider during after hours [place], for this patient?  1. Check the care team in [facility] and look for a) attending/consulting TR[facility] provider listed and b) the [facility] team listed 2. Log into [email or web address] and use [place]'s universal password to access. If you do not have the password, please contact the hospital operator. 3. Locate the [facility] provider you are looking for under [facility] and page to a number that you can be directly reached. 4. If you still have difficulty reaching the provider, please page the [facility] (Director on Call) for the Hospitalists listed on amion for assistance.   02/10/2019, 6:07 PM

## 2019-02-10 NOTE — ED Notes (Signed)
EDP made aware of Troponin level: 64

## 2019-02-10 NOTE — ED Provider Notes (Signed)
[facility] EMERGENCY DEPARTMENT Provider Note   CSN: [id number] Arrival date & time: 02/10/19  1108    History   Chief Complaint Chief Complaint  Patient presents with  . Chest Pain    HPI Monica [name] is a 85 y.o. female presenting for evaluation of chest pain.  Patient states she attempted selective dialysis when she started develop a pain under her sternum.  She is unable to describe how the pain was.  Pain lasted until she took 2 nitroglycerin and the pain resolved.  Then came back mildly for a little bit before resolving prior to coming to the ER.  She denies associated diaphoresis, shortness of breath, nausea, vomiting, abdominal pain.  Patient states 2 days ago after dialysis she felt very poor, like she was going to pass out.  Otherwise she has been feeling well recently.  She has been getting dialysis for the past 9 months.  She goes to dialysis Tuesday, Thursday, Saturday.  She denies recent fevers, chills, cough, urinary symptoms, abnormal bowel movements.  Additional history obtained per chart review.  Patient with a history of CHF (last echo 07/04, 30-35%), CAD, ESRD on dialysis, hypertension, GERD, HLD, diabetes. Last cath (01/10/2019)  Severe native vessel CAD: 100% ostial Circumflex (LCx) and ostial Ramus Intermedius, diffuse calcified proximal LAD followed by 95% stenosis just after 1st Diag followed by 100% -all CTO; severe diffuse proximal to mid RCA disease with CTO of the distal RCA  Essentially occluded/atretic SVG-dRCA  Severe proximal to stent and in-stent restenosis in SVG-OM ? Successful PTCA followed by DES PCI with overlapping DES stent (Synergy 2.5 mm x 12 mm - 2.8 mm)  Severe ostial-proximal SVG-RI ? Successful DES PCI (Synergy 2.5 mm x 20 mm--2.7 mm)  Normal LVEDP despite significant systemic hypertension     HPI  Past Medical History:  Diagnosis Date  . Asthma   . Bilateral pleural effusion    a. s/p CABG >> s/p bilat  thoracentesis 11/2015  . Candida infection 10/2015   Bilateral breasts  . Chronic diastolic CHF    Echo 02/72 [facility]):  EF 55-65, normal wall motion, normal diastolic function  . Coronary artery disease    LHC 3/17 >> 3 v CAD >> CABG // MV 01/2018:  Abnormal, low risk stress nuclear study with significant breast attenuation; cannot R/O prior infarct; no ischemia; EF 56 with normal wall motion. // s/p NSTEMI 11/19 [facility]) >> LHC:  dLM 90, pLAD 100, pLCx 100, mRCA 100, L-LAD ok, S-Dx prox 70, S-OM dist 99 (ulc); S-PDA 100 >>PCI: 2.25 x 24 mm Synergy DES to S-OM //  EF normal by Echo 11/19  . ESRD (end stage renal disease)    Dialysis Tu, Th, Sa  . Essential hypertension   . Gastroesophageal reflux disease   . History of blood transfusion   . History of echocardiogram    a. Echo 3/17: Mild focal basal septal hypertrophy, vigorous LVF, EF 65-70%, normal wall motion, grade 1 diastolic dysfunction, MAC  . Hyperlipidemia   . Long Term Cardiac Monitor 3-14 day    LT Monitor 09/2018: SR, Sinus Tach, 6 beats NSVT, 38 runs of SVT (brief); no AFib.  . Mass   . Peptic ulcer   . Protein calorie malnutrition ([place])   . Type 2 diabetes mellitus [facility])     Patient Active Problem List   Diagnosis Date Noted  . Sinus node dysfunction ([place]) 01/25/2019  . Ischemic cardiomyopathy 01/25/2019  . Hyperkalemia  01/18/2019  . Acute lower UTI 01/18/2019  . Elevated troponin   . Junctional bradycardia   . Bradycardia 01/17/2019  . S/P coronary artery stent placement   . Acute combined systolic and diastolic heart failure ([place])   . NSTEMI (non-ST elevated myocardial infarction) ([place]) 01/08/2019  . Acute respiratory failure ([place]) 01/07/2019  . Diabetes mellitus with peripheral circulatory disorder ([place]) 02/05/2018  . Altered mental status 03/13/2017  . Claudication in peripheral vascular disease ([place]) 12/29/2016  . Abnormal weight loss 06/05/2016  . Bilateral pleural effusion 11/23/2015  .  Chronic combined systolic and diastolic CHF (congestive heart failure) ([place]) 11/23/2015  . SOB (shortness of breath) 11/23/2015  . Pleural effusion 11/23/2015  . CAD (coronary artery disease) 10/05/2015  . S/P CABG x 4 09/19/2015  . History of coronary artery bypass surgery 09/19/2015  . Abnormal finding on EKG - dynamic changes 09/17/2015  . Pain in the chest   . Gastroesophageal reflux disease 09/15/2015  . Gout 09/15/2015  . Chest pain 09/15/2015  . Hyperlipidemia   . Diabetes mellitus with renal complications ([place])   . Obesity (BMI 30.0-34.9) 08/02/2015  . Asthma 04/27/2015  . Vitamin D deficiency 04/27/2015  . Hyperlipidemia associated with type 2 diabetes mellitus ([place]) 04/27/2015  . Essential hypertension 04/24/2015  . ESRD (end stage renal disease) ([place]) 04/24/2015  . Type 2 diabetes mellitus with hyperglycemia ([place]) 04/24/2015  . Metatarsalgia of both feet 07/25/2014  . Equinus deformity of foot, acquired 07/25/2014  . Onychomycosis 07/25/2014  . Pain in lower limb 07/25/2014  . Acquired equinus deformity of foot 07/25/2014  . Metatarsalgia 07/25/2014  . Pain of lower extremity 07/25/2014    Past Surgical History:  Procedure Laterality Date  . ABDOMINAL HYSTERECTOMY    . CARDIAC CATHETERIZATION N/A 09/17/2015   Procedure: Left Heart Cath and Coronary Angiography;  Surgeon: Jettie Booze, MD;  Location: [facility] CV LAB;  Service: Cardiovascular;  Laterality: N/A;  . COLON SURGERY    . CORONARY ARTERY BYPASS GRAFT N/A 09/19/2015   Procedure: CORONARY ARTERY BYPASS GRAFTING (CABG) TIMES FOUR USING BILATARAL SAPHENOUS VEIN GRAFTS AND LEFT INTERNAL MAMMARY ARTERY;  Surgeon: [name], MD;  Location: [facility];  Service: Open Heart Surgery;  Laterality: N/A;  . CORONARY STENT INTERVENTION N/A 01/10/2019   Procedure: CORONARY STENT INTERVENTION;  Surgeon: Leonie Man, MD;  Location: [facility] CV LAB;  Service: Cardiovascular;  Laterality: N/A;  . LEFT HEART  CATH AND CORS/GRAFTS ANGIOGRAPHY N/A 01/10/2019   Procedure: LEFT HEART CATH AND CORS/GRAFTS ANGIOGRAPHY;  Surgeon: Leonie Man, MD;  Location: [facility] CV LAB;  Service: Cardiovascular;  Laterality: N/A;  . TEE WITHOUT CARDIOVERSION N/A 09/19/2015   Procedure: TRANSESOPHAGEAL ECHOCARDIOGRAM (TEE);  Surgeon: [name], MD;  Location: [facility];  Service: Open Heart Surgery;  Laterality: N/A;     OB History   No obstetric history on file.      Home Medications    Prior to Admission medications   Medication Sig Start Date End Date Taking? Authorizing Provider  acetaminophen (TYLENOL) 500 MG tablet Take 1,000 mg by mouth every 6 (six) hours as needed for mild pain or headache.   Yes [provider]  albuterol (PROVENTIL HFA;VENTOLIN HFA) 108 (90 Base) MCG/ACT inhaler Inhale 2 puffs into the lungs every 6 (six) hours as needed for wheezing or shortness of breath.   Yes [provider]  allopurinol (ZYLOPRIM) 100 MG tablet Take 100 mg by mouth 2 (two) times daily.  Yes [provider]  aspirin EC 81 MG EC tablet Take 1 tablet (81 mg total) by mouth daily. 10/03/15  Yes Barrett, Erin R, PA-C  atorvastatin (LIPITOR) 80 MG tablet Take 80 mg by mouth daily at 6 PM.  07/12/18  Yes [provider]  brimonidine (ALPHAGAN P) 0.1 % SOLN Place 1 drop into both eyes 2 (two) times daily.   Yes [provider]  Cholecalciferol (VITAMIN D-3) 1000 units CAPS Take 1,000 Units by mouth daily.    Yes [provider]  clopidogrel (PLAVIX) 75 MG tablet Take 75 mg by mouth daily.    Yes [provider]  diphenhydrAMINE (BENADRYL) 25 MG tablet Take 25 mg by mouth as needed for itching or allergies.    Yes [provider]  ferric citrate (AURYXIA) 1 GM 210 MG(Fe) tablet Take 210 mg by mouth 3 (three) times daily with meals.    Yes [provider]  hydrALAZINE (APRESOLINE) 25 MG tablet Take 1 tablet (25 mg total) by mouth 3 (three)  times daily. 02/07/19  Yes Dorothy Spark, MD  insulin glargine (LANTUS) 100 UNIT/ML injection Inject 10 Units into the skin at bedtime.    Yes [provider]  isosorbide mononitrate (IMDUR) 30 MG 24 hr tablet Take 30 mg by mouth daily.   Yes [provider]  latanoprost (XALATAN) 0.005 % ophthalmic solution Place 1 drop into both eyes at bedtime. 07/05/18  Yes [provider]  loratadine (CLARITIN) 10 MG tablet Take 10 mg by mouth as needed for allergies.    Yes [provider]  Melatonin 5 MG CAPS Take 5 mg by mouth at bedtime.    Yes [provider]  multivitamin (RENA-VIT) TABS tablet Take 1 tablet by mouth daily.   Yes [provider]  nitroGLYCERIN (NITROSTAT) 0.4 MG SL tablet PLACE 1 TABLET BY MOUTH UNDER THE TONGUE EVERY 5 MINUTES AS NEEDED FOR CHEST PAIN. FOR UP TO 3 DOSES. IF NO RELIEF CALL 911 Patient taking differently: Place 0.4 mg under the tongue every 5 (five) minutes as needed for chest pain. FOR UP TO 3 DOSES. IF NO RELIEF CALL 911 07/21/18  Yes Dorothy Spark, MD  pantoprazole (PROTONIX) 20 MG tablet Take 1 tablet (20 mg total) by mouth daily. Patient taking differently: Take 20 mg by mouth at bedtime.  01/26/19  Yes Imogene Burn, PA-C    Family History Family History  Problem Relation Age of Onset  . Heart attack Father     Social History Social History   Tobacco Use  . Smoking status: Never Smoker  . Smokeless tobacco: Former Systems developer    Types: Snuff  Substance Use Topics  . Alcohol use: No    Alcohol/week: 0.0 standard drinks  . Drug use: No     Allergies   Patient has no known allergies.   Review of Systems Review of Systems  Cardiovascular: Positive for chest pain.  All other systems reviewed and are negative.    Physical Exam Updated Vital Signs BP (!) 164/70 (BP Location: Right Arm)   Pulse 91   Temp 98.2 F (36.8 C) (Oral)   Resp 15   SpO2 100%   Physical Exam Vitals signs and  nursing note reviewed.  Constitutional:      General: She is not in acute distress.    Appearance: She is well-developed.     Comments: Elderly female who appears nontoxic  HENT:     Head: Normocephalic and atraumatic.  Eyes:     Extraocular Movements: Extraocular movements intact.     Conjunctiva/sclera: Conjunctivae normal.     Pupils: Pupils are equal, round, and reactive to light.  Neck:     Musculoskeletal: Normal range of motion and neck supple.  Cardiovascular:     Rate and Rhythm: Normal rate and regular rhythm.     Pulses: Normal pulses.  Pulmonary:     Effort: Pulmonary effort is normal. No respiratory distress.     Breath sounds: Normal breath sounds. No wheezing.     Comments: Speaking in full sentences.  Clear lung sounds in all fields. Abdominal:     General: There is no distension.     Palpations: Abdomen is soft. There is no mass.     Tenderness: There is no abdominal tenderness. There is no guarding or rebound.     Comments: No tenderness palpation the abdomen.  Soft without rigidity, guarding, distention.  Musculoskeletal: Normal range of motion.     Comments: No leg pain or swelling  Skin:    General: Skin is warm and dry.     Capillary Refill: Capillary refill takes less than 2 seconds.  Neurological:     Mental Status: She is alert and oriented to person, place, and time.      ED Treatments / Results  Labs (all labs ordered are listed, but only abnormal results are displayed) Labs Reviewed  CBC WITH DIFFERENTIAL/PLATELET - Abnormal; Notable for the following components:      Result Value   Hemoglobin 11.5 (*)    RDW 18.6 (*)    All other components within normal limits  COMPREHENSIVE METABOLIC PANEL - Abnormal; Notable for the following components:   Glucose, Bld 118 (*)    Creatinine, Ser 3.20 (*)    Calcium 8.6 (*)    GFR calc non Af Amer 12 (*)    GFR calc Af Amer 14 (*)    All other components within normal limits  TROPONIN I (HIGH  SENSITIVITY) - Abnormal; Notable for the following components:   Troponin I (High Sensitivity) 32 (*)    All other components within normal limits  TROPONIN I (HIGH SENSITIVITY) - Abnormal; Notable for the following components:   Troponin I (High Sensitivity) 64 (*)    All other components within normal limits  SARS CORONAVIRUS 2 (HOSPITAL ORDER, [facility])    EKG EKG Interpretation  Date/Time:  Thursday February 10 2019 11:16:06 EDT Ventricular Rate:  95 PR Interval:    QRS Duration: 106 QT Interval:  399 QTC Calculation: 502 R Axis:   17 Text Interpretation:  Age not entered, assumed to be  85 years old for purpose of ECG interpretation Sinus or ectopic atrial rhythm Anterior infarct, old Borderline repolarization abnormality Prolonged QT interval When compared to prior, longer QTC.  No sTEMI Confirmed by Antony Blackbird [phone]) on 02/10/2019 11:32:36 AM   Radiology Dg Chest 2 View  Result Date: 02/10/2019 CLINICAL DATA:  85 year old female with chest pain after dialysis. EXAM: CHEST - 2 VIEW COMPARISON:  01/17/2019 and earlier. FINDINGS: PA and lateral views. Stable right chest dialysis catheter. Stable cardiac size and mediastinal contours. Prior CABG. No pneumothorax or pulmonary edema. Small bilateral pleural effusions appear stable since July. No other confluent pulmonary opacity. Exaggerated thoracic kyphosis. Osteopenia. Negative visible bowel gas pattern. IMPRESSION: Stable small bilateral pleural effusions since July. No new cardiopulmonary abnormality. Electronically Signed   By: Genevie Ann M.D.   On: 02/10/2019 12:36  Procedures .Critical Care Performed by: Franchot Heidelberg, PA-C Authorized by: Franchot Heidelberg, PA-C   Critical care provider statement:    Critical care time (minutes):  35   Critical care time was exclusive of:  Separately billable procedures and treating other patients and teaching time   Critical care was necessary to treat or  prevent imminent or life-threatening deterioration of the following conditions:  Cardiac failure   Critical care was time spent personally by me on the following activities:  Blood draw for specimens, development of treatment plan with patient or surrogate, discussions with consultants, discussions with primary provider, examination of patient, obtaining history from patient or surrogate, ordering and performing treatments and interventions, ordering and review of laboratory studies, ordering and review of radiographic studies, pulse oximetry, re-evaluation of patient's condition and review of old charts   I assumed direction of critical care for this patient from another provider in my specialty: no   Comments:     Pt with elevated trop that increased >30. Pt to be admitted for further evaluation.    (including critical care time)  Medications Ordered in ED Medications - No data to display   Initial Impression / Assessment and Plan / ED Course  I have reviewed the triage vital signs and the nursing notes.  Pertinent labs & imaging results that were available during my care of the patient were reviewed by me and considered in my medical decision making (see chart for details).        Patient presenting for evaluation of chest pain.  Physical exam reassuring, she appears nontoxic.  However, patient with a concerning history in that she recently has had occluded stents and has many comorbidities.  As such, will obtain EKG, chest x-ray, labs including troponin.  Chest x-ray viewed interpreted by me, no pneumonia, no torso effusion, cardiomegaly.  CBC reassuring, no leukocytosis.  Hemoglobin stable at baseline at 1.5.  Creatinine elevated at 3.2, this is improved from previous.  Peak patient is ESRD.  Troponin elevated at 32, this is improved from previous.  Likely elevated due to chronic kidney disease.  However, considering symptoms began less than 3 hours ago and patient's comorbidities, will  obtain delta troponin.  Delta troponin elevated at 64, >30 increase.  As such, will call for admission.  Discussed with Dr. Lorin Mercy [facility], patient to be admitted.  Requesting cardiology consultation.  Discussed with nurse practitioner from Cardiology, who agrees to consult on this patient.  Final Clinical Impressions(s) / ED Diagnoses   Final diagnoses:  Chest pain, unspecified type    ED Discharge Orders    None       Franchot Heidelberg, PA-C 02/10/19 1543    Tegeler, Gwenyth Allegra, MD 02/10/19 2125

## 2019-02-10 NOTE — Plan of Care (Signed)
  Problem: Education: Goal: Ability to demonstrate management of disease process will improve Outcome: Progressing Goal: Ability to verbalize understanding of medication therapies will improve Outcome: Progressing Goal: Individualized Educational Video(s) Outcome: Progressing   Problem: Activity: Goal: Capacity to carry out activities will improve Outcome: Progressing   Problem: Cardiac: Goal: Ability to achieve and maintain adequate cardiopulmonary perfusion will improve Outcome: Progressing   Problem: Education: Goal: Knowledge of General Education information will improve Description: Including pain rating scale, medication(s)/side effects and non-pharmacologic comfort measures Outcome: Progressing   Problem: Health Behavior/Discharge Planning: Goal: Ability to manage health-related needs will improve Outcome: Progressing   Problem: Clinical Measurements: Goal: Ability to maintain clinical measurements within normal limits will improve Outcome: Progressing Goal: Will remain free from infection Outcome: Progressing Goal: Diagnostic test results will improve Outcome: Progressing Goal: Respiratory complications will improve Outcome: Progressing Goal: Cardiovascular complication will be avoided Outcome: Progressing   Problem: Nutrition: Goal: Adequate nutrition will be maintained Outcome: Progressing   Problem: Activity: Goal: Risk for activity intolerance will decrease Outcome: Progressing   Problem: Coping: Goal: Level of anxiety will decrease Outcome: Progressing   Problem: Elimination: Goal: Will not experience complications related to bowel motility Outcome: Progressing Goal: Will not experience complications related to urinary retention Outcome: Progressing   Problem: Pain Managment: Goal: General experience of comfort will improve Outcome: Progressing   Problem: Safety: Goal: Ability to remain free from injury will improve Outcome: Progressing    Problem: Skin Integrity: Goal: Risk for impaired skin integrity will decrease Outcome: Progressing

## 2019-02-10 NOTE — ED Notes (Signed)
Pt ambulatory to bathroom with one assist.

## 2019-02-11 ENCOUNTER — Encounter (HOSPITAL_COMMUNITY)
Admission: EM | Disposition: A | Discharge status: Home / Self Care | Payer: Self-pay | Source: Home / Self Care | Attending: Emergency Medicine

## 2019-02-11 ENCOUNTER — Observation Stay (HOSPITAL_BASED_OUTPATIENT_CLINIC_OR_DEPARTMENT_OTHER): Payer: Medicare HMO

## 2019-02-11 DIAGNOSIS — E1122 Type 2 diabetes mellitus with diabetic chronic kidney disease: Secondary | ICD-10-CM

## 2019-02-11 DIAGNOSIS — N183 Chronic kidney disease, stage 3 (moderate): Secondary | ICD-10-CM | POA: Diagnosis not present

## 2019-02-11 DIAGNOSIS — N2581 Secondary hyperparathyroidism of renal origin: Secondary | ICD-10-CM | POA: Diagnosis not present

## 2019-02-11 DIAGNOSIS — Z794 Long term (current) use of insulin: Secondary | ICD-10-CM

## 2019-02-11 DIAGNOSIS — I10 Essential (primary) hypertension: Secondary | ICD-10-CM | POA: Diagnosis not present

## 2019-02-11 DIAGNOSIS — I132 Hypertensive heart and chronic kidney disease with heart failure and with stage 5 chronic kidney disease, or end stage renal disease: Secondary | ICD-10-CM | POA: Diagnosis not present

## 2019-02-11 DIAGNOSIS — I2510 Atherosclerotic heart disease of native coronary artery without angina pectoris: Secondary | ICD-10-CM

## 2019-02-11 DIAGNOSIS — Z1159 Encounter for screening for other viral diseases: Secondary | ICD-10-CM | POA: Diagnosis not present

## 2019-02-11 DIAGNOSIS — E785 Hyperlipidemia, unspecified: Secondary | ICD-10-CM | POA: Diagnosis not present

## 2019-02-11 DIAGNOSIS — Z992 Dependence on renal dialysis: Secondary | ICD-10-CM | POA: Diagnosis not present

## 2019-02-11 DIAGNOSIS — N186 End stage renal disease: Secondary | ICD-10-CM | POA: Diagnosis not present

## 2019-02-11 DIAGNOSIS — I200 Unstable angina: Secondary | ICD-10-CM

## 2019-02-11 DIAGNOSIS — D631 Anemia in chronic kidney disease: Secondary | ICD-10-CM | POA: Diagnosis not present

## 2019-02-11 DIAGNOSIS — E1169 Type 2 diabetes mellitus with other specified complication: Secondary | ICD-10-CM | POA: Diagnosis not present

## 2019-02-11 DIAGNOSIS — I214 Non-ST elevation (NSTEMI) myocardial infarction: Secondary | ICD-10-CM | POA: Diagnosis not present

## 2019-02-11 DIAGNOSIS — I120 Hypertensive chronic kidney disease with stage 5 chronic kidney disease or end stage renal disease: Secondary | ICD-10-CM | POA: Diagnosis not present

## 2019-02-11 DIAGNOSIS — I252 Old myocardial infarction: Secondary | ICD-10-CM | POA: Diagnosis not present

## 2019-02-11 DIAGNOSIS — I5042 Chronic combined systolic (congestive) and diastolic (congestive) heart failure: Secondary | ICD-10-CM | POA: Diagnosis not present

## 2019-02-11 HISTORY — PX: LEFT HEART CATH AND CORS/GRAFTS ANGIOGRAPHY: CATH118250

## 2019-02-11 LAB — RENAL FUNCTION PANEL
Albumin: 3.1 g/dL — ABNORMAL LOW (ref 3.5–5.0)
Anion gap: 10 (ref 5–15)
BUN: 32 mg/dL — ABNORMAL HIGH (ref 8–23)
CO2: 27 mmol/L (ref 22–32)
Calcium: 8.5 mg/dL — ABNORMAL LOW (ref 8.9–10.3)
Chloride: 101 mmol/L (ref 98–111)
Creatinine, Ser: 4.19 mg/dL — ABNORMAL HIGH (ref 0.44–1.00)
GFR calc Af Amer: 10 mL/min — ABNORMAL LOW (ref >60)
GFR calc non Af Amer: 9 mL/min — ABNORMAL LOW (ref >60)
Glucose, Bld: 102 mg/dL — ABNORMAL HIGH (ref 70–99)
Phosphorus: 4.1 mg/dL (ref 2.5–4.6)
Potassium: 4.5 mmol/L (ref 3.5–5.1)
Sodium: 138 mmol/L (ref 135–145)

## 2019-02-11 LAB — LIPID PANEL
Cholesterol: 122 mg/dL (ref 0–200)
HDL: 50 mg/dL (ref >40)
LDL Cholesterol: 63 mg/dL (ref 0–99)
Total CHOL/HDL Ratio: 2.4 RATIO
Triglycerides: 43 mg/dL (ref <150)
VLDL: 9 mg/dL (ref 0–40)

## 2019-02-11 LAB — CBC
HCT: 32.8 % — ABNORMAL LOW (ref 36.0–46.0)
Hemoglobin: 10.6 g/dL — ABNORMAL LOW (ref 12.0–15.0)
MCH: 29.5 pg (ref 26.0–34.0)
MCHC: 32.3 g/dL (ref 30.0–36.0)
MCV: 91.4 fL (ref 80.0–100.0)
Platelets: 193 10*3/uL (ref 150–400)
RBC: 3.59 MIL/uL — ABNORMAL LOW (ref 3.87–5.11)
RDW: 18.5 % — ABNORMAL HIGH (ref 11.5–15.5)
WBC: 5.5 10*3/uL (ref 4.0–10.5)
nRBC: 0 % (ref 0.0–0.2)

## 2019-02-11 LAB — NM MYOCAR MULTI W/SPECT W/WALL MOTION / EF
Estimated workload: 1 METS
Exercise duration (min): 5 min
Exercise duration (sec): 1 s
LV dias vol: 170 mL (ref 46–106)
LV sys vol: 131 mL
Peak HR: 111 {beats}/min
Rest HR: 100 {beats}/min
TID: 1.38

## 2019-02-11 LAB — GLUCOSE, CAPILLARY
Glucose-Capillary: 96 mg/dL (ref 70–99)
Glucose-Capillary: 190 mg/dL — ABNORMAL HIGH (ref 70–99)
Glucose-Capillary: 165 mg/dL — ABNORMAL HIGH (ref 70–99)

## 2019-02-11 LAB — POCT ACTIVATED CLOTTING TIME: Activated Clotting Time: 170 seconds

## 2019-02-11 LAB — HEPARIN LEVEL (UNFRACTIONATED): Heparin Unfractionated: 0.17 IU/mL — ABNORMAL LOW (ref 0.30–0.70)

## 2019-02-11 LAB — TROPONIN I (HIGH SENSITIVITY): Troponin I (High Sensitivity): 492 ng/L (ref <18)

## 2019-02-11 SURGERY — LEFT HEART CATH AND CORS/GRAFTS ANGIOGRAPHY
Anesthesia: LOCAL

## 2019-02-11 MED ORDER — LABETALOL HCL 5 MG/ML IV SOLN: 10.0000 mg | INTRAVENOUS | Status: AC | PRN

## 2019-02-11 MED ORDER — TECHNETIUM TC 99M TETROFOSMIN IV KIT
30.0000 | PACK | Freq: Once | INTRAVENOUS | Status: AC | PRN
  Administered 2019-02-11: 30 via INTRAVENOUS

## 2019-02-11 MED ORDER — MIDAZOLAM HCL 2 MG/2ML IJ SOLN
INTRAMUSCULAR | Status: AC
  Filled 2019-02-11: qty 2

## 2019-02-11 MED ORDER — CHLORHEXIDINE GLUCONATE CLOTH 2 % EX PADS: 6.0000 | MEDICATED_PAD | Freq: Every day | CUTANEOUS | Status: DC

## 2019-02-11 MED ORDER — SODIUM CHLORIDE 0.9% FLUSH: 3.0000 mL | Freq: Two times a day (BID) | INTRAVENOUS | Status: DC

## 2019-02-11 MED ORDER — SODIUM CHLORIDE 0.9% FLUSH: 3.0000 mL | INTRAVENOUS | Status: DC | PRN

## 2019-02-11 MED ORDER — LIDOCAINE HCL (PF) 1 % IJ SOLN
INTRAMUSCULAR | Status: AC
  Filled 2019-02-11: qty 30

## 2019-02-11 MED ORDER — IOHEXOL 350 MG/ML SOLN
INTRAVENOUS | Status: DC | PRN
  Administered 2019-02-11: 17:00:00 115 mL via INTRA_ARTERIAL

## 2019-02-11 MED ORDER — LIDOCAINE HCL (PF) 1 % IJ SOLN
INTRAMUSCULAR | Status: DC | PRN
  Administered 2019-02-11: 15 mL

## 2019-02-11 MED ORDER — CARVEDILOL 3.125 MG PO TABS
3.1250 mg | ORAL_TABLET | Freq: Two times a day (BID) | ORAL | Status: DC
  Administered 2019-02-11 – 2019-02-12 (×2): 3.125 mg via ORAL
  Filled 2019-02-11 (×3): qty 1

## 2019-02-11 MED ORDER — HYDRALAZINE HCL 20 MG/ML IJ SOLN: 10.0000 mg | INTRAMUSCULAR | Status: AC | PRN

## 2019-02-11 MED ORDER — HEPARIN (PORCINE) IN NACL 1000-0.9 UT/500ML-% IV SOLN
INTRAVENOUS | Status: DC | PRN
  Administered 2019-02-11 (×2): 500 mL

## 2019-02-11 MED ORDER — HEPARIN (PORCINE) IN NACL 1000-0.9 UT/500ML-% IV SOLN
INTRAVENOUS | Status: AC
  Filled 2019-02-11: qty 1000

## 2019-02-11 MED ORDER — SODIUM CHLORIDE 0.9 % IV SOLN: 250.0000 mL | INTRAVENOUS | Status: DC | PRN

## 2019-02-11 MED ORDER — MIDAZOLAM HCL 2 MG/2ML IJ SOLN
INTRAMUSCULAR | Status: DC | PRN
  Administered 2019-02-11: 1 mg via INTRAVENOUS

## 2019-02-11 MED ORDER — SODIUM CHLORIDE 0.9 % IV SOLN
INTRAVENOUS | Status: DC
  Administered 2019-02-11: 16:00:00 via INTRAVENOUS

## 2019-02-11 MED ORDER — SODIUM CHLORIDE 0.9% FLUSH
3.0000 mL | Freq: Two times a day (BID) | INTRAVENOUS | Status: DC
  Administered 2019-02-11 – 2019-02-12 (×2): 3 mL via INTRAVENOUS

## 2019-02-11 MED ORDER — ASPIRIN 81 MG PO CHEW: 81.0000 mg | CHEWABLE_TABLET | ORAL | Status: AC

## 2019-02-11 MED ORDER — REGADENOSON 0.4 MG/5ML IV SOLN
INTRAVENOUS | Status: AC
  Filled 2019-02-11: qty 5

## 2019-02-11 MED ORDER — REGADENOSON 0.4 MG/5ML IV SOLN
0.4000 mg | Freq: Once | INTRAVENOUS | Status: AC
  Administered 2019-02-11: 10:00:00 0.4 mg via INTRAVENOUS
  Filled 2019-02-11: qty 5

## 2019-02-11 MED ORDER — TECHNETIUM TC 99M TETROFOSMIN IV KIT
10.0000 | PACK | Freq: Once | INTRAVENOUS | Status: AC | PRN
  Administered 2019-02-11: 10 via INTRAVENOUS

## 2019-02-11 SURGICAL SUPPLY — 13 items
BAG SNAP BAND KOVER 36X36 (MISCELLANEOUS) ×1 IMPLANT
CATH EXPO 5F MPA-1 (CATHETERS) ×1 IMPLANT
CATH INFINITI 5FR MULTPACK ANG (CATHETERS) ×1 IMPLANT
CATH LAUNCHER 5F EBU3.0 (CATHETERS) IMPLANT
CATHETER LAUNCHER 5F EBU3.0 (CATHETERS) ×2
COVER DOME SNAP 22 D (MISCELLANEOUS) ×1 IMPLANT
KIT HEART LEFT (KITS) ×2 IMPLANT
PACK CARDIAC CATHETERIZATION (CUSTOM PROCEDURE TRAY) ×2 IMPLANT
SHEATH PINNACLE 5F 10CM (SHEATH) ×1 IMPLANT
SHEATH PROBE COVER 6X72 (BAG) ×1 IMPLANT
TRANSDUCER W/STOPCOCK (MISCELLANEOUS) ×2 IMPLANT
TUBING CIL FLEX 10 FLL-RA (TUBING) ×2 IMPLANT
WIRE EMERALD 3MM-J .035X150CM (WIRE) ×1 IMPLANT

## 2019-02-11 NOTE — Progress Notes (Signed)
[place] for heparin Indication: chest pain/ACS  No Known Allergies  Patient Measurements: Height: 5\' 2"  (157.5 cm) Weight: 160 lb 14.4 oz (73 kg) IBW/kg (Calculated) : 50.1  Vital Signs: Temp: 98.3 F (36.8 C) (08/07 0553) Temp Source: Oral (08/07 0553) BP: 161/71 (08/07 0553) Pulse Rate: 89 (08/07 0553)  Labs: Recent Labs    02/10/19 1141 02/10/19 1316 02/10/19 1955 02/11/19 0536  HGB 11.5*  --   --  10.6*  HCT 36.9  --   --  32.8*  PLT 179  --   --  193  HEPARINUNFRC  --   --   --  0.17*  CREATININE 3.20*  --   --   --   TROPONINIHS 32* 64* 264* 492*    Estimated Creatinine Clearance: 11.8 mL/min (A) (by C-G formula based on SCr of 3.2 mg/dL (H)).  Assessment: CC/HPI: 85 yo f presenting with CP  PMH: CAD CABG ESRD HD GERD HLD DM HF  Anticoag: none pta - iv hep for r/o acs  Heparin level this AM is slightly below goal.  No known issues with IV infusion.  No overt bleeding or complications noted.  Troponins up this AM.  Goal of Therapy:  Heparin level 0.3-0.7 units/ml Monitor platelets by anticoagulation protocol: Yes   Plan:  Increase IV heparin to 900 units/hr Recheck heparin level in 8 hrs Daily heparin level and CBC. F/u plans for cards workup.  Marguerite Olea, [facility] Clinical Pharmacist Phone [phone]  02/11/2019 7:53 AM

## 2019-02-11 NOTE — Consult Note (Signed)
Reason for Consult: To manage dialysis and dialysis related needs Referring Physician: Lilianne Dronen is an 85 y.o. female with Pmhx significant for ESRD- HD tts at [facility] as well as DM, HTN and CAD.  Pt was here last month NSTEMI s/p cath with PCI and then with bradycardia related to a betablocker that was stopped. She was brought to hospital yesterday when she suffered CP on her dialysis treatment (3 hours in) - first occurrence since the stenting- troponins seem elevated.  seen by cards and underwent stress echo today- still some lingering CP.  Nothing different about HD yesterday than normal.  Says she might be going home today    Orders at last admit- [facility] - TTS  3.5 hours, 350 BFR- Using AVF but still has TDC-  To have removed soon EDW 162 pounds- per pt cannot take off more than 5 pounds 180 dialyzer, 3 K, no heparin, 1 of hectorol per tx, ferrlecit 62.5 weekly   Past Medical History:  Diagnosis Date  . Asthma   . Chronic diastolic CHF    Echo [id number] [facility]):  EF 55-65, normal wall motion, normal diastolic function  . Coronary artery disease    LHC 3/17 >> 3 v CAD >> CABG // MV 01/2018:  Abnormal, low risk stress nuclear study with significant breast attenuation; cannot R/O prior infarct; no ischemia; EF 56 with normal wall motion. // s/p NSTEMI 11/19 [facility]) >> LHC:  dLM 90, pLAD 100, pLCx 100, mRCA 100, L-LAD ok, S-Dx prox 70, S-OM dist 99 (ulc); S-PDA 100 >>PCI: 2.25 x 24 mm Synergy DES to S-OM //  EF normal by Echo 11/19  . ESRD (end stage renal disease)    Dialysis Tu, Th, Sa  . Essential hypertension   . Gastroesophageal reflux disease   . History of blood transfusion   . Hyperlipidemia   . Peptic ulcer   . Protein calorie malnutrition ([place])   . Type 2 diabetes mellitus ([place])     Past Surgical History:  Procedure Laterality Date  . ABDOMINAL HYSTERECTOMY    . CARDIAC CATHETERIZATION N/A 09/17/2015   Procedure: Left Heart Cath and Coronary  Angiography;  Surgeon: Jettie Booze, MD;  Location: [facility];  Service: Cardiovascular;  Laterality: N/A;  . COLON SURGERY    . CORONARY ARTERY BYPASS GRAFT N/A 09/19/2015   Procedure: CORONARY ARTERY BYPASS GRAFTING (CABG) TIMES FOUR USING BILATARAL SAPHENOUS VEIN GRAFTS AND LEFT INTERNAL MAMMARY ARTERY;  Surgeon: [name], MD;  Location: [place];  Service: Open Heart Surgery;  Laterality: N/A;  . CORONARY STENT INTERVENTION N/A 01/10/2019   Procedure: CORONARY STENT INTERVENTION;  Surgeon: Leonie Man, MD;  Location: [place];  Service: Cardiovascular;  Laterality: N/A;  . LEFT HEART CATH AND CORS/GRAFTS ANGIOGRAPHY N/A 01/10/2019   Procedure: LEFT HEART CATH AND CORS/GRAFTS ANGIOGRAPHY;  Surgeon: Leonie Man, MD;  Location: [place] [facility];  Service: Cardiovascular;  Laterality: N/A;  . TEE WITHOUT CARDIOVERSION N/A 09/19/2015   Procedure: TRANSESOPHAGEAL ECHOCARDIOGRAM (TEE);  Surgeon: [name], MD;  Location: [place];  Service: Open Heart Surgery;  Laterality: N/A;    Family History  Problem Relation Age of Onset  . Heart attack Father     Social History:  reports that she has never smoked. She has quit using smokeless tobacco.  Her smokeless tobacco use included snuff. She reports that she does not drink alcohol or use drugs.  Allergies: No Known Allergies  Medications: I have reviewed the patient's current medications.   Results for orders placed or performed during the hospital encounter of 02/10/19 (from the past 48 hour(s))  Troponin I (High Sensitivity)     Status: Abnormal   Collection Time: 02/10/19 11:41 AM  Result Value Ref Range   Troponin I (High Sensitivity) 32 (H) <18 ng/L    Comment: (NOTE) Elevated high sensitivity troponin I (hsTnI) values and significant  changes across serial measurements may suggest ACS but many other  chronic and acute conditions are known to elevate hsTnI results.  Refer to the "Links" section  for chest pain algorithms and additional  guidance. Performed at [facility], [place] [address], [place], [place] [address]   CBC with Differential     Status: Abnormal   Collection Time: 02/10/19 11:41 AM  Result Value Ref Range   WBC 4.4 4.0 - 10.5 K/uL   RBC 3.91 3.87 - 5.11 MIL/uL   Hemoglobin 11.5 (L) 12.0 - 15.0 g/dL   HCT 36.9 36.0 - 46.0 %   MCV 94.4 80.0 - 100.0 fL   MCH 29.4 26.0 - 34.0 pg   MCHC 31.2 30.0 - 36.0 g/dL   RDW 18.6 (H) 11.5 - 15.5 %   Platelets 179 150 - 400 K/uL   nRBC 0.0 0.0 - 0.2 %   Neutrophils Relative % 59 %   Neutro Abs 2.6 1.7 - 7.7 K/uL   Lymphocytes Relative 27 %   Lymphs Abs 1.2 0.7 - 4.0 K/uL   Monocytes Relative 11 %   Monocytes Absolute 0.5 0.1 - 1.0 K/uL   Eosinophils Relative 2 %   Eosinophils Absolute 0.1 0.0 - 0.5 K/uL   Basophils Relative 1 %   Basophils Absolute 0.0 0.0 - 0.1 K/uL   Immature Granulocytes 0 %   Abs Immature Granulocytes 0.01 0.00 - 0.07 K/uL    Comment: Performed at [facility] [address], [place], [place] [address]  Comprehensive metabolic panel     Status: Abnormal   Collection Time: 02/10/19 11:41 AM  Result Value Ref Range   Sodium 137 135 - 145 mmol/L   Potassium 4.3 3.5 - 5.1 mmol/L   Chloride 100 98 - 111 mmol/L   CO2 24 22 - 32 mmol/L   Glucose, Bld 118 (H) 70 - 99 mg/dL   BUN 21 8 - 23 mg/dL   Creatinine, Ser 3.20 (H) 0.44 - 1.00 mg/dL   Calcium 8.6 (L) 8.9 - 10.3 mg/dL   Total Protein 6.8 6.5 - 8.1 g/dL   Albumin 3.5 3.5 - 5.0 g/dL   AST 31 15 - 41 U/L   ALT 24 0 - 44 U/L   Alkaline Phosphatase 84 38 - 126 U/L   Total Bilirubin 0.6 0.3 - 1.2 mg/dL   GFR calc non Af Amer 12 (L) >60 mL/min   GFR calc Af Amer 14 (L) >60 mL/min   Anion gap 13 5 - 15    Comment: Performed at [facility] [address], [place], [place] [address]  Troponin I (High Sensitivity)     Status: Abnormal   Collection Time: 02/10/19  1:16 PM  Result Value Ref Range   Troponin I (High Sensitivity) 64  (H) <18 ng/L    Comment: RESULT CALLED TO, READ BACK BY AND VERIFIED WITH: PULLIAM,P RN @1442  ON FC:[id number] BY FLEMINGS (NOTE) Elevated high sensitivity troponin I (hsTnI) values and significant  changes across serial  measurements may suggest ACS but many other  chronic and acute conditions are known to elevate hsTnI results.  Refer to the Links section for chest pain algorithms and additional  guidance. Performed at [facility], [place] [address] [place], [place] [address]   SARS Coronavirus 2 [facility] order, Performed in [facility] hospital lab) Nasopharyngeal Nasopharyngeal Swab     Status: None   Collection Time: 02/10/19  3:59 PM   Specimen: Nasopharyngeal Swab  Result Value Ref Range   SARS Coronavirus 2 NEGATIVE NEGATIVE    Comment: (NOTE) If result is NEGATIVE SARS-CoV-2 target nucleic acids are NOT DETECTED. The SARS-CoV-2 RNA is generally detectable in upper and lower  respiratory specimens during the acute phase of infection. The lowest  concentration of SARS-CoV-2 viral copies this assay can detect is 250  copies / mL. A negative result does not preclude SARS-CoV-2 infection  and should not be used as the sole basis for treatment or other  patient management decisions.  A negative result may occur with  improper specimen collection / handling, submission of specimen other  than nasopharyngeal swab, presence of viral mutation(s) within the  areas targeted by this assay, and inadequate number of viral copies  (<250 copies / mL). A negative result must be combined with clinical  observations, patient history, and epidemiological information. If result is POSITIVE SARS-CoV-2 target nucleic acids are DETECTED. The SARS-CoV-2 RNA is generally detectable in upper and lower  respiratory specimens dur ing the acute phase of infection.  Positive  results are indicative of active infection with SARS-CoV-2.  Clinical  correlation with patient history and other diagnostic  information is  necessary to determine patient infection status.  Positive results do  not rule out bacterial infection or co-infection with other viruses. If result is PRESUMPTIVE POSTIVE SARS-CoV-2 nucleic acids MAY BE PRESENT.   A presumptive positive result was obtained on the submitted specimen  and confirmed on repeat testing.  While 2019 novel coronavirus  (SARS-CoV-2) nucleic acids may be present in the submitted sample  additional confirmatory testing may be necessary for epidemiological  and / or clinical management purposes  to differentiate between  SARS-CoV-2 and other Sarbecovirus currently known to infect humans.  If clinically indicated additional testing with an alternate test  methodology [id number]) is advised. The SARS-CoV-2 RNA is generally  detectable in upper and lower respiratory sp ecimens during the acute  phase of infection. The expected result is Negative. Fact Sheet for Patients:  StrictlyIdeas.no Fact Sheet for Healthcare Providers: BankingDealers.co.za This test is not yet approved or cleared by the [place] FDA and has been authorized for detection and/or diagnosis of SARS-CoV-2 by FDA under an Emergency Use Authorization (EUA).  This EUA will remain in effect (meaning this test can be used) for the duration of the COVID-19 declaration under Section 564(b)(1) of the Act, 21 U.S.C. section 360bbb-3(b)(1), unless the authorization is terminated or revoked sooner. Performed at [facility], [place] [address], [place], [place] [address]   Troponin I (High Sensitivity)     Status: Abnormal   Collection Time: 02/10/19  7:55 PM  Result Value Ref Range   Troponin I (High Sensitivity) 264 (HH) <18 ng/L    Comment: CRITICAL RESULT CALLED TO, READ BACK BY AND VERIFIED WITH: [facility] RN 02/10/2019 2213 JORDANS (NOTE) Elevated high sensitivity troponin I (hsTnI) values and significant  changes across serial  measurements may suggest ACS but many other  chronic and acute conditions are known to  elevate hsTnI results.  Refer to the Links section for chest pain algorithms and additional  guidance. Performed at [facility], [place] [address] [place], [place] [address]   TSH     Status: None   Collection Time: 02/10/19  7:55 PM  Result Value Ref Range   TSH 2.019 0.350 - 4.500 uIU/mL    Comment: Performed by a 3rd Generation assay with a functional sensitivity of <=0.01 uIU/mL. Performed at [facility], [place] [address] [place], [place] [address]   Glucose, capillary     Status: Abnormal   Collection Time: 02/10/19 10:17 PM  Result Value Ref Range   Glucose-Capillary 169 (H) 70 - 99 mg/dL  Heparin level (unfractionated)     Status: Abnormal   Collection Time: 02/11/19  5:36 AM  Result Value Ref Range   Heparin Unfractionated 0.17 (L) 0.30 - 0.70 IU/mL    Comment: (NOTE) If heparin results are below expected values, and patient dosage has  been confirmed, suggest follow up testing of antithrombin III levels. Performed at [facility], [place] [address], [place], [place] [address]   CBC     Status: Abnormal   Collection Time: 02/11/19  5:36 AM  Result Value Ref Range   WBC 5.5 4.0 - 10.5 K/uL   RBC 3.59 (L) 3.87 - 5.11 MIL/uL   Hemoglobin 10.6 (L) 12.0 - 15.0 g/dL   HCT 32.8 (L) 36.0 - 46.0 %   MCV 91.4 80.0 - 100.0 fL   MCH 29.5 26.0 - 34.0 pg   MCHC 32.3 30.0 - 36.0 g/dL   RDW 18.5 (H) 11.5 - 15.5 %   Platelets 193 150 - 400 K/uL   nRBC 0.0 0.0 - 0.2 %    Comment: Performed at [facility], [place] [address] [place], [place] [address]  Troponin I (High Sensitivity)     Status: Abnormal   Collection Time: 02/11/19  5:36 AM  Result Value Ref Range   Troponin I (High Sensitivity) 492 (HH) <18 ng/L    Comment: CRITICAL RESULT CALLED TO, READ BACK BY AND VERIFIED WITH: Collene Mares [id number] WILDERK (NOTE) Elevated high sensitivity troponin I (hsTnI) values and  significant  changes across serial measurements may suggest ACS but many other  chronic and acute conditions are known to elevate hsTnI results.  Refer to the Links section for chest pain algorithms and additional  guidance. Performed at [facility], [place] [address] [place], [place] [address]   Lipid panel     Status: None   Collection Time: 02/11/19  5:36 AM  Result Value Ref Range   Cholesterol 122 0 - 200 mg/dL   Triglycerides 43 <150 mg/dL   HDL 50 >40 mg/dL   Total CHOL/HDL Ratio 2.4 RATIO   VLDL 9 0 - 40 mg/dL   LDL Cholesterol 63 0 - 99 mg/dL    Comment:        Total Cholesterol/HDL:CHD Risk Coronary Heart Disease Risk Table                     Men   Women  1/2 Average Risk   3.4   3.3  Average Risk       5.0   4.4  2 X Average Risk   9.6   7.1  3 X Average Risk  23.4   11.0        Use the calculated Patient Ratio above and the CHD  Risk Table to determine the patient's CHD Risk.        ATP III CLASSIFICATION (LDL):  <100     mg/dL   Optimal  100-129  mg/dL   Near or Above                    Optimal  130-159  mg/dL   Borderline  160-189  mg/dL   High  >190     mg/dL   Very High Performed at [facility] [address] [place], [place] [address]   Glucose, capillary     Status: None   Collection Time: 02/11/19  6:39 AM  Result Value Ref Range   Glucose-Capillary 96 70 - 99 mg/dL  Glucose, capillary     Status: Abnormal   Collection Time: 02/11/19 11:57 AM  Result Value Ref Range   Glucose-Capillary 190 (H) 70 - 99 mg/dL    Dg Chest 2 View  Result Date: 02/10/2019 CLINICAL DATA:  85 year old female with chest pain after dialysis. EXAM: CHEST - 2 VIEW COMPARISON:  01/17/2019 and earlier. FINDINGS: PA and lateral views. Stable right chest dialysis catheter. Stable cardiac size and mediastinal contours. Prior CABG. No pneumothorax or pulmonary edema. Small bilateral pleural effusions appear stable since July. No other confluent pulmonary opacity.  Exaggerated thoracic kyphosis. Osteopenia. Negative visible bowel gas pattern. IMPRESSION: Stable small bilateral pleural effusions since July. No new cardiopulmonary abnormality. Electronically Signed   By: Genevie Ann M.D.   On: 02/10/2019 12:36    ROS: a little lingering chest discomfort after the stress echo  Blood pressure (!) 153/56, pulse 96, temperature 98.2 F (36.8 C), temperature source Oral, resp. rate 20, height 5\' 2"  (1.575 m), weight 73 kg, SpO2 97 %. General appearance: alert, mild distress and appears younger than age Resp: clear to auscultation bilaterally Cardio: regular rate and rhythm, S1, S2 normal, no murmur, click, rub or gallop GI: soft, non-tender; bowel sounds normal; no masses,  no organomegaly Extremities: extremities normal, atraumatic, no cyanosis or edema Neurologic: Grossly normal left upper AVF-  bruised but with bruit, also TDC in right chest  Assessment/Plan: 85 year old BF with ESRD and CAD.  Presenting with CP while getting HD on 8/6 1 Chest pain-  relatively recent NSTEMI s/p PCI with stenting.  Recurrent CP with elevated troponin-  Per cards, had stress echo today.  On ASA, lipitor, plavix and low dose coreg, hydralazine and imdur 2 ESRD: normally TTS at [facility].  Shortened treatment yesterday but not by much.  No needs today.  Will plan for HD here tomorrow if still here via AVF but do have [facility] for back up if trouble with AVF.  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  3 Hypertension: BP slightly high - on coreg low dose and hydralazine-  Remember that beta blocker caused symptomatic brady in the past so if going to titrate would do hydralazine-  Will see what HD can do- is under her EDW but may need to be lowered  4. Anemia of ESRD: hgb above 10- as of last hosp was not on ESA-- cont to hold for now 5. Metabolic Bone Disease: cont home meds of auryxia , hectorol, renavite    Louis Meckel 02/11/2019, 12:01 PM

## 2019-02-11 NOTE — Progress Notes (Signed)
PROGRESS NOTE    Monica [name]  [id number] DOB: 1931/05/02 DOA: 02/10/2019 PCP: Benito Mccreedy, MD    Brief Narrative:  HPI per Dr. Hewitt Shorts Monica [name] is a 85 y.o. female with medical history significant of DM; HLD; HTN; ESRD on TTS HD; CAD s/p CABG; and chronic diastolic CHF presenting with chest pain.  The patient was in HD today and was almost done when she started feeling bad.  She developed substernal CP.  She took her own NTG tabs x 2 with improvement.  The pain recurred and she took 2 more NTG in the ambulance with resolution of the pain.  This pain was different from her prior MI.    Her last cath was 01/10/19 with severe multivessel disease including severe diffuse disease in the graft.  She had 2 DES placed with plan to treat the occluded RCA system medically.  ED Course:  Chest pain, increased troponin. Pain started at the end of HD session, improved with NTG x 2 and resolved with more.  Troponin 32 (normal), increased to 64.     Assessment & Plan:   Principal Problem:   Chest pain Active Problems:   Diabetes mellitus with renal complications (HCC)   Essential hypertension   Chronic combined systolic and diastolic CHF (congestive heart failure) (HCC)   ESRD (end stage renal disease) (HCC)   Hyperlipidemia associated with type 2 diabetes mellitus (HCC)   Non-ST elevation (NSTEMI) myocardial infarction ([place])  1 chest pain with history of coronary artery disease with recent stenting/acute coronary syndrome Patient presenting with substernal chest pain which started during hemodialysis on the day of admission and resolved with nitroglycerin x4.  Patient with complex cardiac history with history of CABG with stents to SVG IM and SVG to circumflex 01/10/2019 with occluded graft to RCA and collaterals to native distal RCA.  Patient denies any noncompliance with medications.  Cardiac enzymes which were cycled were mildly elevated overnight however significantly elevated this morning  with troponins going from 32 to 64 >>264 >>492.  Patient underwent Myoview stress test this morning which was abnormal and consistent with ischemia and a high risk stress test.  Patient being followed by cardiology and patient for cardiac catheterization this afternoon.  Continue aspirin, Lipitor, Coreg, Plavix, Imdur.  Per cardiology.  2.  Hypertension Stable.  Continue Coreg, Imdur.  3.  Hyperlipidemia Continue statin.  4.  Diabetes mellitus type 2 Last hemoglobin A1c was 7.074 2020.  Continue Lantus and sliding scale insulin.  5.  End-stage renal disease on hemodialysis Tuesday Thursday Saturdays Patient not volume overloaded on examination.  Consult with nephrology.  6.  Combined systolic diastolic heart failure Patient euvolemic at this time and compensated.  2D echo from 01/08/2019 with a EF of 30 to 35%.  Patient underwent Myoview stress test this morning which was abnormal consistent with ischemia and high risk stress test.  Patient for cardiac catheterization this afternoon per cardiology.  Continue aspirin, Lipitor, Coreg, Imdur.  Per cardiology.     DVT prophylaxis: Heparin Code Status: Full Family Communication: Updated patient and daughter at bedside. Disposition Plan: Home when okay with cardiology.   Consultants:   Cardiology: Dr. Johnsie Cancel 02/10/2019  Nephrology: Dr. Moshe Cipro 02/11/2019  Procedures:  Chest x-ray 02/10/2019  Myoview stress test pending 02/11/2019  Antimicrobials:   None   Subjective: Patient laying in bed.  Patient just returned from [facility] study.  Stated had some chest pain post Myoview which is improving.  Denies any shortness of breath.  Daughter at bedside.  Objective: Vitals:   02/11/19 1709 02/11/19 1714 02/11/19 1718 02/11/19 1723  BP: (!) 160/64 (!) 173/70 (!) 165/70 (!) 174/73  Pulse: 87 93 91 87  Resp: 13 13 14 12   Temp:      TempSrc:      SpO2: 100% 100% 100% 100%  Weight:      Height:        Intake/Output Summary  (Last 24 hours) at 02/11/2019 1749 Last data filed at 02/11/2019 [id number] Gross per 24 hour  Intake 240 ml  Output 400 ml  Net -160 ml   Filed Weights   02/10/19 2123 02/11/19 0707  Weight: 73.6 kg 73 kg    Examination:  General exam: Appears calm and comfortable  Respiratory system: Clear to auscultation. Respiratory effort normal. Cardiovascular system: S1 & S2 heard, RRR. No JVD, murmurs, rubs, gallops or clicks. No pedal edema. Gastrointestinal system: Abdomen is nondistended, soft and nontender. No organomegaly or masses felt. Normal bowel sounds heard. Central nervous system: Alert and oriented. No focal neurological deficits. Extremities: Symmetric 5 x 5 power. Skin: No rashes, lesions or ulcers Psychiatry: Judgement and insight appear normal. Mood & affect appropriate.     Data Reviewed: I have personally reviewed following labs and imaging studies  CBC: Recent Labs  Lab 02/10/19 1141 02/11/19 0536  WBC 4.4 5.5  NEUTROABS 2.6  --   HGB 11.5* 10.6*  HCT 36.9 32.8*  MCV 94.4 91.4  PLT 179 0000000   Basic Metabolic Panel: Recent Labs  Lab 02/10/19 1141 02/11/19 0536  NA 137 138  K 4.3 4.5  CL 100 101  CO2 24 27  GLUCOSE 118* 102*  BUN 21 32*  CREATININE 3.20* 4.19*  CALCIUM 8.6* 8.5*  PHOS  --  4.1   GFR: Estimated Creatinine Clearance: 9 mL/min (A) (by C-G formula based on SCr of 4.19 mg/dL (H)). Liver Function Tests: Recent Labs  Lab 02/10/19 1141 02/11/19 0536  AST 31  --   ALT 24  --   ALKPHOS 84  --   BILITOT 0.6  --   PROT 6.8  --   ALBUMIN 3.5 3.1*   No results for input(s): LIPASE, AMYLASE in the last 168 hours. No results for input(s): AMMONIA in the last 168 hours. Coagulation Profile: No results for input(s): INR, PROTIME in the last 168 hours. Cardiac Enzymes: No results for input(s): CKTOTAL, CKMB, CKMBINDEX, TROPONINI in the last 168 hours. BNP (last 3 results) No results for input(s): PROBNP in the last 8760 hours. HbA1C: No results  for input(s): HGBA1C in the last 72 hours. CBG: Recent Labs  Lab 02/10/19 2217 02/11/19 0639 02/11/19 1157  GLUCAP 169* 96 190*   Lipid Profile: Recent Labs    02/11/19 0536  CHOL 122  HDL 50  LDLCALC 63  TRIG 43  CHOLHDL 2.4   Thyroid Function Tests: Recent Labs    02/10/19 1955  TSH 2.019   Anemia Panel: No results for input(s): VITAMINB12, FOLATE, FERRITIN, TIBC, IRON, RETICCTPCT in the last 72 hours. Sepsis Labs: No results for input(s): PROCALCITON, LATICACIDVEN in the last 168 hours.  Recent Results (from the past 240 hour(s))  SARS Coronavirus 2 [facility] order, Performed in [facility] hospital lab) Nasopharyngeal Nasopharyngeal Swab     Status: None   Collection Time: 02/10/19  3:59 PM   Specimen: Nasopharyngeal Swab  Result Value Ref Range Status   SARS Coronavirus 2 NEGATIVE NEGATIVE Final    Comment: (NOTE) If result is  NEGATIVE SARS-CoV-2 target nucleic acids are NOT DETECTED. The SARS-CoV-2 RNA is generally detectable in upper and lower  respiratory specimens during the acute phase of infection. The lowest  concentration of SARS-CoV-2 viral copies this assay can detect is 250  copies / mL. A negative result does not preclude SARS-CoV-2 infection  and should not be used as the sole basis for treatment or other  patient management decisions.  A negative result may occur with  improper specimen collection / handling, submission of specimen other  than nasopharyngeal swab, presence of viral mutation(s) within the  areas targeted by this assay, and inadequate number of viral copies  (<250 copies / mL). A negative result must be combined with clinical  observations, patient history, and epidemiological information. If result is POSITIVE SARS-CoV-2 target nucleic acids are DETECTED. The SARS-CoV-2 RNA is generally detectable in upper and lower  respiratory specimens dur ing the acute phase of infection.  Positive  results are indicative of active  infection with SARS-CoV-2.  Clinical  correlation with patient history and other diagnostic information is  necessary to determine patient infection status.  Positive results do  not rule out bacterial infection or co-infection with other viruses. If result is PRESUMPTIVE POSTIVE SARS-CoV-2 nucleic acids MAY BE PRESENT.   A presumptive positive result was obtained on the submitted specimen  and confirmed on repeat testing.  While 2019 novel coronavirus  (SARS-CoV-2) nucleic acids may be present in the submitted sample  additional confirmatory testing may be necessary for epidemiological  and / or clinical management purposes  to differentiate between  SARS-CoV-2 and other Sarbecovirus currently known to infect humans.  If clinically indicated additional testing with an alternate test  methodology [phone]) is advised. The SARS-CoV-2 RNA is generally  detectable in upper and lower respiratory sp ecimens during the acute  phase of infection. The expected result is Negative. Fact Sheet for Patients:  StrictlyIdeas.no Fact Sheet for Healthcare Providers: BankingDealers.co.za This test is not yet approved or cleared by the [place] FDA and has been authorized for detection and/or diagnosis of SARS-CoV-2 by FDA under an Emergency Use Authorization (EUA).  This EUA will remain in effect (meaning this test can be used) for the duration of the COVID-19 declaration under Section 564(b)(1) of the Act, 21 U.S.C. section 360bbb-3(b)(1), unless the authorization is terminated or revoked sooner. Performed at [facility], [place] [address], [place], [place] [address]          Radiology Studies: Dg Chest 2 View  Result Date: 02/10/2019 CLINICAL DATA:  85 year old female with chest pain after dialysis. EXAM: CHEST - 2 VIEW COMPARISON:  01/17/2019 and earlier. FINDINGS: PA and lateral views. Stable right chest dialysis catheter. Stable  cardiac size and mediastinal contours. Prior CABG. No pneumothorax or pulmonary edema. Small bilateral pleural effusions appear stable since July. No other confluent pulmonary opacity. Exaggerated thoracic kyphosis. Osteopenia. Negative visible bowel gas pattern. IMPRESSION: Stable small bilateral pleural effusions since July. No new cardiopulmonary abnormality. Electronically Signed   By: Genevie Ann M.D.   On: 02/10/2019 12:36   Nm Myocar Multi W/spect W/wall Motion / Ef  Addendum Date: 02/11/2019    Horizontal ST segment depression ST segment depression was noted during stress in the I, II and V5 leads.  The left ventricular ejection fraction is severely decreased (<30%).  Nuclear stress EF: 23%.  Defect 1: There is a large defect of severe severity present in the basal anteroseptal, basal inferoseptal, mid anteroseptal, mid inferoseptal, apical  septal and apex location.  Defect 2: There is a large defect of severe severity present in the basal anterior, basal anterolateral, mid anterior, mid anterolateral and apical anterior location.  This is a high risk study.  Findings consistent with ischemia.  Change from prior study.  Resting perfusion study has a large, severe defect in the entire septum and apex that is partially reversible based on stress imaging. However, the anterior and part of anterolateral walls have a reversible perfusion defect on stress images that is large and severe. EF is also severely reduced, with global hypokinesis and septal dyskinesis. TID is also 1.38, suggested of global ischemia. Abnormal, high risk stress test.   Result Date: 02/11/2019  There was no ST segment deviation noted during stress.  The left ventricular ejection fraction is severely decreased (<30%).  Nuclear stress EF: 23%.  Defect 1: There is a large defect of severe severity present in the basal anteroseptal, basal inferoseptal, mid anteroseptal, mid inferoseptal, apical septal and apex location.  Defect 2:  There is a large defect of severe severity present in the basal anterior, basal anterolateral, mid anterior, mid anterolateral and apical anterior location.  This is a high risk study.  Findings consistent with ischemia.  Change from prior study.  Resting perfusion study has a large, severe defect in the entire septum and apex that is partially reversible based on stress imaging. However, the anterior and part of anterolateral walls have a reversible perfusion defect on stress images that is large and severe. EF is also severely reduced, with global hypokinesis and septal dyskinesis. TID is also 1.38, suggested of global ischemia. Abnormal, high risk stress test.        Scheduled Meds:  [MAR Hold] allopurinol  100 mg Oral BID   [MAR Hold] aspirin  81 mg Oral Daily   [MAR Hold] atorvastatin  80 mg Oral q1800   [MAR Hold] brimonidine  1 drop Both Eyes BID   [MAR Hold] carvedilol  3.125 mg Oral BID WC   [MAR Hold] Chlorhexidine Gluconate Cloth  6 each Topical Q0600   [MAR Hold] clopidogrel  75 mg Oral Daily   [MAR Hold] ferric citrate  210 mg Oral TID WC   [MAR Hold] hydrALAZINE  25 mg Oral TID   [MAR Hold] insulin aspart  0-9 Units Subcutaneous TID WC   [MAR Hold] insulin glargine  10 Units Subcutaneous QHS   [MAR Hold] isosorbide mononitrate  60 mg Oral Daily   [MAR Hold] latanoprost  1 drop Both Eyes QHS   [MAR Hold] Melatonin  6 mg Oral QHS   [MAR Hold] multivitamin  1 tablet Oral QHS   [MAR Hold] pantoprazole  20 mg Oral QHS   regadenoson       [MAR Hold] sodium chloride flush  3 mL Intravenous Q12H   Continuous Infusions:  sodium chloride     sodium chloride 10 mL/hr at 02/11/19 1610   heparin 900 Units/hr (02/11/19 0800)     LOS: 0 days    Time spent: 40 minutes    Irine Seal, MD [facility]  If 7PM-7AM, please contact night-coverage [email or web address] 02/11/2019, 5:49 PM

## 2019-02-11 NOTE — H&P (View-Only) (Signed)
Subjective:  Already taken to nuclear  Daughter indicates good night with no angina   Objective:  Vitals:   02/10/19 2310 02/11/19 0433 02/11/19 0553 02/11/19 0707  BP: (!) 142/74 (!) 153/71 (!) 161/71   Pulse: 91 86 89   Resp: 18 18 20    Temp: 98.3 F (36.8 C) 98.8 F (37.1 C) 98.3 F (36.8 C)   TempSrc: Oral Oral Oral   SpO2: 99% 96% 97%   Weight:    73 kg  Height:        Intake/Output from previous day:  Intake/Output Summary (Last 24 hours) at 02/11/2019 0816 Last data filed at 02/11/2019 [id number] Gross per 24 hour  Intake 240 ml  Output 400 ml  Net -160 ml    Physical Exam:  Chronically ill black female Dialysis catheter right subclavian Fistula LUE SEM Lungs clear No edema  Lab Results: Basic Metabolic Panel: Recent Labs    02/10/19 1141  NA 137  K 4.3  CL 100  CO2 24  GLUCOSE 118*  BUN 21  CREATININE 3.20*  CALCIUM 8.6*   Liver Function Tests: Recent Labs    02/10/19 1141  AST 31  ALT 24  ALKPHOS 84  BILITOT 0.6  PROT 6.8  ALBUMIN 3.5   No results for input(s): LIPASE, AMYLASE in the last 72 hours. CBC: Recent Labs    02/10/19 1141 02/11/19 0536  WBC 4.4 5.5  NEUTROABS 2.6  --   HGB 11.5* 10.6*  HCT 36.9 32.8*  MCV 94.4 91.4  PLT 179 193   Fasting Lipid Panel: Recent Labs    02/11/19 0536  CHOL 122  HDL 50  LDLCALC 63  TRIG 43  CHOLHDL 2.4   Thyroid Function Tests: Recent Labs    02/10/19 1955  TSH 2.019   Anemia Panel: No results for input(s): VITAMINB12, FOLATE, FERRITIN, TIBC, IRON, RETICCTPCT in the last 72 hours.  Imaging: Dg Chest 2 View  Result Date: 02/10/2019 CLINICAL DATA:  85 year old female with chest pain after dialysis. EXAM: CHEST - 2 VIEW COMPARISON:  01/17/2019 and earlier. FINDINGS: PA and lateral views. Stable right chest dialysis catheter. Stable cardiac size and mediastinal contours. Prior CABG. No pneumothorax or pulmonary edema. Small bilateral pleural effusions appear stable since July. No  other confluent pulmonary opacity. Exaggerated thoracic kyphosis. Osteopenia. Negative visible bowel gas pattern. IMPRESSION: Stable small bilateral pleural effusions since July. No new cardiopulmonary abnormality. Electronically Signed   By: Genevie Ann M.D.   On: 02/10/2019 12:36    Cardiac Studies:  ECG: SR rate 90 no acute changes    Telemetry:  NSR 02/11/2019   Echo: 01/08/19 EF 30-35% moderate MR   Medications:    allopurinol  100 mg Oral BID   aspirin  81 mg Oral Daily   atorvastatin  80 mg Oral q1800   brimonidine  1 drop Both Eyes BID   clopidogrel  75 mg Oral Daily   ferric citrate  210 mg Oral TID WC   hydrALAZINE  25 mg Oral TID   insulin aspart  0-9 Units Subcutaneous TID WC   insulin glargine  10 Units Subcutaneous QHS   isosorbide mononitrate  60 mg Oral Daily   latanoprost  1 drop Both Eyes QHS   Melatonin  6 mg Oral QHS   multivitamin  1 tablet Oral QHS   pantoprazole  20 mg Oral QHS      heparin 750 Units/hr (02/10/19 2236)    Assessment/Plan:   Chest Pain /  Angina:  Previous CABG stents to SVG IM and SVG to circumflex 01/10/19 with occluded graft  To RCA and collaterals to native distal RCA. Troponin turned mildly positive over night Started on heparin Patient already down for nuclear study to assess ischemic burden No acute ECG changes Continue DAT and imdur dose consider adding Ranexa  During admission 7/14 had some bradycardia in setting of ischemia Will try to add back low dose coreg for angina relief watch on telemetry  CRF:  LUE fistual new normally gets dialysis Saturday's primary service will need to arrange if she is in hospital over weekend.    CHF:  EF 30-35% volume status ok volume controlled with dialysis due Saturday Needs right subclavian dialysis catheter removed soon as LUE fistual is functional and being used  [employer] 02/11/2019, 8:16 AM

## 2019-02-11 NOTE — Progress Notes (Signed)
Site area:right femoral artery Site Prior to Removal:  Level 0 Pressure Applied For:20 Manual:   yes Patient Status During Pull: stable Post Pull Site:  Level0 Post Pull Instructions Given:  yes Post Pull Pulses Present: rt DP by doppler Dressing Applied:  Sterile tegaderm Bedrest begins @ 16:25 Comments: patient tolerated well

## 2019-02-11 NOTE — Progress Notes (Signed)
Critical Lab Value: Troponin 492  Date & Time Notified: [place] Silos @ 7:40am  Orders Received/Actions Taken: Awaiting   Neta Mends RN 7:44 AM 02-11-2019

## 2019-02-11 NOTE — Progress Notes (Signed)
Monica [name] presented for a nuclear stress test today.  No immediate complications.  Stress imaging is pending at this time.   Preliminary EKG findings may be listed in the chart, but the stress test result will not be finalized until perfusion imaging is complete.   Leanor Kail, [place]

## 2019-02-11 NOTE — Progress Notes (Signed)
Stress test is abnormal   Horizontal ST segment depression ST segment depression was noted during stress in the I, II and V5 leads.  The left ventricular ejection fraction is severely decreased (<30%).  Nuclear stress EF: 23%.  Defect 1: There is a large defect of severe severity present in the basal anteroseptal, basal inferoseptal, mid anteroseptal, mid inferoseptal, apical septal and apex location.  Defect 2: There is a large defect of severe severity present in the basal anterior, basal anterolateral, mid anterior, mid anterolateral and apical anterior location.  This is a high risk study.  Findings consistent with ischemia.  Change from prior study.   Resting perfusion study has a large, severe defect in the entire septum and apex that is partially reversible based on stress imaging. However, the anterior and part of anterolateral walls have a reversible perfusion defect on stress images that is large and severe. EF is also severely reduced, with global hypokinesis and septal dyskinesis. TID is also 1.38, suggested of global ischemia. Abnormal, high risk stress test.  Patient has eaten except crackers at 10 am.   The patient understands that risks include but are not limited to stroke (1 in 1000), death (1 in 1000), kidney failure [usually temporary] (1 in 500), bleeding (1 in 200), allergic reaction [possibly serious] (1 in 200), and agrees to proceed.

## 2019-02-11 NOTE — Progress Notes (Signed)
Subjective:  Already taken to nuclear  Daughter indicates good night with no angina   Objective:  Vitals:   02/10/19 2310 02/11/19 0433 02/11/19 0553 02/11/19 0707  BP: (!) 142/74 (!) 153/71 (!) 161/71   Pulse: 91 86 89   Resp: 18 18 20    Temp: 98.3 F (36.8 C) 98.8 F (37.1 C) 98.3 F (36.8 C)   TempSrc: Oral Oral Oral   SpO2: 99% 96% 97%   Weight:    73 kg  Height:        Intake/Output from previous day:  Intake/Output Summary (Last 24 hours) at 02/11/2019 0816 Last data filed at 02/11/2019 [id number] Gross per 24 hour  Intake 240 ml  Output 400 ml  Net -160 ml    Physical Exam:  Chronically ill black female Dialysis catheter right subclavian Fistula LUE SEM Lungs clear No edema  Lab Results: Basic Metabolic Panel: Recent Labs    02/10/19 1141  NA 137  K 4.3  CL 100  CO2 24  GLUCOSE 118*  BUN 21  CREATININE 3.20*  CALCIUM 8.6*   Liver Function Tests: Recent Labs    02/10/19 1141  AST 31  ALT 24  ALKPHOS 84  BILITOT 0.6  PROT 6.8  ALBUMIN 3.5   No results for input(s): LIPASE, AMYLASE in the last 72 hours. CBC: Recent Labs    02/10/19 1141 02/11/19 0536  WBC 4.4 5.5  NEUTROABS 2.6  --   HGB 11.5* 10.6*  HCT 36.9 32.8*  MCV 94.4 91.4  PLT 179 193   Fasting Lipid Panel: Recent Labs    02/11/19 0536  CHOL 122  HDL 50  LDLCALC 63  TRIG 43  CHOLHDL 2.4   Thyroid Function Tests: Recent Labs    02/10/19 1955  TSH 2.019   Anemia Panel: No results for input(s): VITAMINB12, FOLATE, FERRITIN, TIBC, IRON, RETICCTPCT in the last 72 hours.  Imaging: Dg Chest 2 View  Result Date: 02/10/2019 CLINICAL DATA:  85 year old female with chest pain after dialysis. EXAM: CHEST - 2 VIEW COMPARISON:  01/17/2019 and earlier. FINDINGS: PA and lateral views. Stable right chest dialysis catheter. Stable cardiac size and mediastinal contours. Prior CABG. No pneumothorax or pulmonary edema. Small bilateral pleural effusions appear stable since July. No  other confluent pulmonary opacity. Exaggerated thoracic kyphosis. Osteopenia. Negative visible bowel gas pattern. IMPRESSION: Stable small bilateral pleural effusions since July. No new cardiopulmonary abnormality. Electronically Signed   By: Genevie Ann M.D.   On: 02/10/2019 12:36    Cardiac Studies:  ECG: SR rate 90 no acute changes    Telemetry:  NSR 02/11/2019   Echo: 01/08/19 EF 30-35% moderate MR   Medications:    allopurinol  100 mg Oral BID   aspirin  81 mg Oral Daily   atorvastatin  80 mg Oral q1800   brimonidine  1 drop Both Eyes BID   clopidogrel  75 mg Oral Daily   ferric citrate  210 mg Oral TID WC   hydrALAZINE  25 mg Oral TID   insulin aspart  0-9 Units Subcutaneous TID WC   insulin glargine  10 Units Subcutaneous QHS   isosorbide mononitrate  60 mg Oral Daily   latanoprost  1 drop Both Eyes QHS   Melatonin  6 mg Oral QHS   multivitamin  1 tablet Oral QHS   pantoprazole  20 mg Oral QHS      heparin 750 Units/hr (02/10/19 2236)    Assessment/Plan:   Chest Pain /  Angina:  Previous CABG stents to SVG IM and SVG to circumflex 01/10/19 with occluded graft  To RCA and collaterals to native distal RCA. Troponin turned mildly positive over night Started on heparin Patient already down for nuclear study to assess ischemic burden No acute ECG changes Continue DAT and imdur dose consider adding Ranexa  During admission 7/14 had some bradycardia in setting of ischemia Will try to add back low dose coreg for angina relief watch on telemetry  CRF:  LUE fistual new normally gets dialysis Saturday's primary service will need to arrange if she is in hospital over weekend.    CHF:  EF 30-35% volume status ok volume controlled with dialysis due Saturday Needs right subclavian dialysis catheter removed soon as LUE fistual is functional and being used  [employer] 02/11/2019, 8:16 AM

## 2019-02-11 NOTE — Care Management Obs Status (Signed)
[facility] NOTIFICATION   Patient Details  Name: Monica [name] MRN: [id number] Date of Birth: Nov 19, 1930   Medicare Observation Status Notification Given:  Yes    Marilu Favre, RN 02/11/2019, 2:15 PM

## 2019-02-11 NOTE — Interval H&P Note (Signed)
History and Physical Interval Note:  02/11/2019 4:25 PM  Monica [name]  has presented today for cardiac cath with the diagnosis of CAD with unstable angina/abnormal stress test.  The various methods of treatment have been discussed with the patient and family. After consideration of risks, benefits and other options for treatment, the patient has consented to  Procedure(s): LEFT HEART CATH AND CORS/GRAFTS ANGIOGRAPHY (N/A) as a surgical intervention.  The patient's history has been reviewed, patient examined, no change in status, stable for surgery.  I have reviewed the patient's chart and labs.  Questions were answered to the patient's satisfaction.    Cath Lab Visit (complete for each Cath Lab visit)  Clinical Evaluation Leading to the Procedure:   ACS: Yes.    Non-ACS:    Anginal Classification: CCS III  Anti-ischemic medical therapy: Minimal Therapy (1 class of medications)  Non-Invasive Test Results: High-risk stress test findings: cardiac mortality >3%/year  Prior CABG: Previous CABG         Lauree Chandler

## 2019-02-11 NOTE — Progress Notes (Signed)
Renal Navigator spoke with OP HD clinic/[facility] MSW/Sloane who states patient is TTS 1st shift and transported by her children.  Renal Navigator spoke with patient's RN who is unsure of discharge plan at this point, but discussed abnormal stress test results with Renal Navigator and tentative POC. Renal Navigator staffed case with Dr. Tobey Bride.   Alphonzo Cruise, [place] Renal Navigator [phone]

## 2019-02-12 DIAGNOSIS — Z951 Presence of aortocoronary bypass graft: Secondary | ICD-10-CM

## 2019-02-12 DIAGNOSIS — D631 Anemia in chronic kidney disease: Secondary | ICD-10-CM | POA: Diagnosis not present

## 2019-02-12 DIAGNOSIS — I214 Non-ST elevation (NSTEMI) myocardial infarction: Secondary | ICD-10-CM | POA: Diagnosis not present

## 2019-02-12 DIAGNOSIS — I209 Angina pectoris, unspecified: Secondary | ICD-10-CM | POA: Diagnosis not present

## 2019-02-12 DIAGNOSIS — R079 Chest pain, unspecified: Secondary | ICD-10-CM

## 2019-02-12 DIAGNOSIS — N2581 Secondary hyperparathyroidism of renal origin: Secondary | ICD-10-CM | POA: Diagnosis not present

## 2019-02-12 DIAGNOSIS — I120 Hypertensive chronic kidney disease with stage 5 chronic kidney disease or end stage renal disease: Secondary | ICD-10-CM | POA: Diagnosis not present

## 2019-02-12 DIAGNOSIS — N186 End stage renal disease: Secondary | ICD-10-CM | POA: Diagnosis not present

## 2019-02-12 DIAGNOSIS — K219 Gastro-esophageal reflux disease without esophagitis: Secondary | ICD-10-CM

## 2019-02-12 DIAGNOSIS — I10 Essential (primary) hypertension: Secondary | ICD-10-CM | POA: Diagnosis not present

## 2019-02-12 DIAGNOSIS — I5042 Chronic combined systolic (congestive) and diastolic (congestive) heart failure: Secondary | ICD-10-CM | POA: Diagnosis not present

## 2019-02-12 DIAGNOSIS — Z992 Dependence on renal dialysis: Secondary | ICD-10-CM | POA: Diagnosis not present

## 2019-02-12 DIAGNOSIS — E1169 Type 2 diabetes mellitus with other specified complication: Secondary | ICD-10-CM | POA: Diagnosis not present

## 2019-02-12 DIAGNOSIS — E1122 Type 2 diabetes mellitus with diabetic chronic kidney disease: Secondary | ICD-10-CM | POA: Diagnosis not present

## 2019-02-12 LAB — CBC
HCT: 31.8 % — ABNORMAL LOW (ref 36.0–46.0)
Hemoglobin: 10.4 g/dL — ABNORMAL LOW (ref 12.0–15.0)
MCH: 29.6 pg (ref 26.0–34.0)
MCHC: 32.7 g/dL (ref 30.0–36.0)
MCV: 90.6 fL (ref 80.0–100.0)
Platelets: 187 10*3/uL (ref 150–400)
RBC: 3.51 MIL/uL — ABNORMAL LOW (ref 3.87–5.11)
RDW: 18.2 % — ABNORMAL HIGH (ref 11.5–15.5)
WBC: 6.1 10*3/uL (ref 4.0–10.5)
nRBC: 0 % (ref 0.0–0.2)

## 2019-02-12 LAB — RENAL FUNCTION PANEL
Albumin: 3.1 g/dL — ABNORMAL LOW (ref 3.5–5.0)
Anion gap: 13 (ref 5–15)
BUN: 40 mg/dL — ABNORMAL HIGH (ref 8–23)
CO2: 24 mmol/L (ref 22–32)
Calcium: 8.4 mg/dL — ABNORMAL LOW (ref 8.9–10.3)
Chloride: 100 mmol/L (ref 98–111)
Creatinine, Ser: 4.61 mg/dL — ABNORMAL HIGH (ref 0.44–1.00)
GFR calc Af Amer: 9 mL/min — ABNORMAL LOW (ref >60)
GFR calc non Af Amer: 8 mL/min — ABNORMAL LOW (ref >60)
Glucose, Bld: 70 mg/dL (ref 70–99)
Phosphorus: 5.1 mg/dL — ABNORMAL HIGH (ref 2.5–4.6)
Potassium: 4.5 mmol/L (ref 3.5–5.1)
Sodium: 137 mmol/L (ref 135–145)

## 2019-02-12 LAB — HEPATITIS B SURFACE ANTIGEN: Hepatitis B Surface Ag: NEGATIVE

## 2019-02-12 LAB — GLUCOSE, CAPILLARY
Glucose-Capillary: 109 mg/dL — ABNORMAL HIGH (ref 70–99)
Glucose-Capillary: 46 mg/dL — ABNORMAL LOW (ref 70–99)
Glucose-Capillary: 165 mg/dL — ABNORMAL HIGH (ref 70–99)
Glucose-Capillary: 64 mg/dL — ABNORMAL LOW (ref 70–99)
Glucose-Capillary: 134 mg/dL — ABNORMAL HIGH (ref 70–99)

## 2019-02-12 MED ORDER — ACETAMINOPHEN 500 MG PO TABS: 500.0000 mg | ORAL_TABLET | Freq: Four times a day (QID) | ORAL | 0 refills | Status: AC | PRN

## 2019-02-12 MED ORDER — CARVEDILOL 3.125 MG PO TABS: 3.1250 mg | ORAL_TABLET | Freq: Two times a day (BID) | ORAL | 2 refills | Status: DC

## 2019-02-12 MED ORDER — HEPARIN SODIUM (PORCINE) 1000 UNIT/ML IJ SOLN
INTRAMUSCULAR | Status: AC
  Administered 2019-02-12: 11:00:00
  Filled 2019-02-12: qty 4

## 2019-02-12 MED ORDER — ISOSORBIDE MONONITRATE ER 60 MG PO TB24: 60.0000 mg | ORAL_TABLET | Freq: Every day | ORAL | 2 refills | Status: DC

## 2019-02-12 NOTE — Plan of Care (Signed)

## 2019-02-12 NOTE — Progress Notes (Signed)
At 0542 pt complained felt funny and sweat, alert and oriented time 4, gave juice and snack, recheck 0600 am CBG 109, asymptomatic, rest in bed, notify provider

## 2019-02-12 NOTE — Discharge Summary (Signed)
Physician Discharge Summary  Monica [name] [id number] DOB: 06-12-1931 DOA: 02/10/2019  PCP: Benito Mccreedy, MD  Admit date: 02/10/2019 Discharge date: 02/12/2019  Time spent: 55 minutes  Recommendations for Outpatient Follow-up:  1. Follow-up with Dr. [name] cardiology in 2 weeks. 2. Follow-up with Benito Mccreedy, MD in 2 weeks.  On follow-up patient's diabetes will need to be reassessed as patient's Lantus was discontinued due to low blood glucose levels.  Patient also needs a basic metabolic profile done to follow-up on electrolytes and renal function. 3. Follow-up in hemodialysis center as scheduled.   Discharge Diagnoses:  Principal Problem:   Non-ST elevation (NSTEMI) myocardial infarction [facility]) Active Problems:   Diabetes mellitus with renal complications (HCC)   Gastroesophageal reflux disease   Chest pain   Essential hypertension   S/P CABG x 4   CAD (coronary artery disease)   Chronic combined systolic and diastolic CHF (congestive heart failure) (HCC)   ESRD (end stage renal disease) ([place])   Hyperlipidemia associated with type 2 diabetes mellitus ([place])   Discharge Condition: Stable and improved  Diet recommendation: Heart healthy  Filed Weights   02/11/19 0707 02/12/19 0555 02/12/19 0718  Weight: 73 kg 73.8 kg 74.2 kg    History of present illness:  Per Dr Westley Chandler is a 85 y.o. female with medical history significant of DM; HLD; HTN; ESRD on TTS HD; CAD s/p CABG; and chronic diastolic CHF presenting with chest pain.  The patient was in HD today and was almost done when she started feeling bad.  She developed substernal CP.  She took her own NTG tabs x 2 with improvement.  The pain recurred and she took 2 more NTG in the ambulance with resolution of the pain.  This pain was different from her prior MI.    Her last cath was 01/10/19 with severe multivessel disease including severe diffuse disease in the graft.  She had 2 DES placed with plan to treat the  occluded RCA system medically.  ED Course:  Chest pain, increased troponin. Pain started at the end of HD session, improved with NTG x 2 and resolved with more.  Troponin 32 (normal), increased to 64  Hospital Course:  1 NSTEMI/chest pain with history of coronary artery disease with recent stenting/acute coronary syndrome Patient presented with substernal chest pain which started during hemodialysis on the day of admission and resolved with nitroglycerin x4.  Patient with complex cardiac history with history of CABG with stents to SVG IM and SVG to circumflex 01/10/2019 with occluded graft to RCA and collaterals to native distal RCA.  Patient denies any noncompliance with medications.  Cardiac enzymes which were cycled were mildly elevated overnight however significantly elevated this morning with troponins going from 32 to 64 >>264 >>492.  Patient underwent Myoview stress test this morning which was abnormal and consistent with ischemia and a high risk stress test.  Patient being followed by cardiology and patient subsequently underwent cardiac catheterization on 02/11/2019 which showed chronic occlusion of the LAD which was known, LIMA to LAD patent, chronic occlusion of the circumflex which is known.  SVG to ramus intermedius is patent with patent proximal stent.  SVG to the OM is patent with patent distal body stent.  Chronic occlusion mid RCA.  SVG to RCA known to be occluded.  Patient was maintained on aspirin, Lipitor, Coreg and Imdur dose was increased to 60 mg per cardiology.  Medical management was recommended.patient had no further chest pain.  Patient underwent hemodialysis  on day of discharge without any further chest pain.  Patient was discharged home in stable improved condition.  Outpatient follow-up with cardiology.   2.  Hypertension Patient maintained on Coreg and Imdur.  Outpatient follow-up.    3.  Hyperlipidemia Patient maintained on a statin.    4.  Diabetes mellitus type 2 Last  hemoglobin A1c was 7.0 on 7/4/ 2020.  Patient was initially placed on Lantus as well as sliding scale insulin.  Due to low blood glucose levels Lantus discontinued during the hospitalization.  Outpatient follow-up.   5.  End-stage renal disease on hemodialysis Tuesday Thursday Saturdays Patient not volume overloaded on examination.    Patient seen by nephrology.  Patient underwent hemodialysis on day of discharge which was on Saturday.  Patient had no chest pain during hemodialysis.  Patient will be discharged home in stable and improved condition.   6.  Combined systolic diastolic heart failure Patient euvolemic at this time and compensated.  2D echo from 01/08/2019 with a EF of 30 to 35%.  Patient underwent Myoview stress test on 02/11/2019, which was abnormal consistent with ischemia and high risk stress test.  Patient subsequently underwent cardiac catheterization the afternoon of 02/11/2019 and noted to have chronic occlusion of the LAD which was known, LIMA to LAD patent, chronic occlusion of the circumflex which is known.  SVG to ramus intermedius is patent with patent proximal stent.  SVG to the OM is patent with patent distal body stent.  Chronic occlusion mid RCA.  SVG to RCA known to be occluded.  Patient was maintained on aspirin, Lipitor, Coreg and Imdur dose was increased to 60 mg per cardiology.  Medical management was recommended.  Outpatient follow-up with cardiology.    Procedures:  Chest x-ray 02/10/2019  Myoview stress test 02/11/2019  Cardiac catheterization 02/11/2019--Per Dr Angelena Form  Consultations:  Cardiology: Dr. Johnsie Cancel 02/10/2019  Nephrology: Dr. Moshe Cipro 02/11/2019  Discharge Exam: Vitals:   02/12/19 1108 02/12/19 1203  BP: (!) 151/72 (!) 145/69  Pulse: 85 84  Resp: 18 20  Temp: 98.1 F (36.7 C) 99.5 F (37.5 C)  SpO2: 99% 100%    General: NAD Cardiovascular: RRR Respiratory: CTAB  Discharge Instructions   Discharge Instructions    Diet - low sodium heart  healthy   Complete by: As directed    Increase activity slowly   Complete by: As directed      Allergies as of 02/12/2019   No Known Allergies     Medication List    STOP taking these medications   insulin glargine 100 UNIT/ML injection Commonly known as: LANTUS     TAKE these medications   acetaminophen 500 MG tablet Commonly known as: TYLENOL Take 1 tablet (500 mg total) by mouth every 6 (six) hours as needed for mild pain or headache. What changed: how much to take   albuterol 108 (90 Base) MCG/ACT inhaler Commonly known as: VENTOLIN HFA Inhale 2 puffs into the lungs every 6 (six) hours as needed for wheezing or shortness of breath.   allopurinol 100 MG tablet Commonly known as: ZYLOPRIM Take 100 mg by mouth 2 (two) times daily.   Alphagan P 0.1 % Soln Generic drug: brimonidine Place 1 drop into both eyes 2 (two) times daily.   aspirin 81 MG EC tablet Take 1 tablet (81 mg total) by mouth daily.   atorvastatin 80 MG tablet Commonly known as: LIPITOR Take 80 mg by mouth daily at 6 PM.   carvedilol 3.125 MG tablet Commonly  known as: COREG Take 1 tablet (3.125 mg total) by mouth 2 (two) times daily with a meal.   clopidogrel 75 MG tablet Commonly known as: PLAVIX Take 75 mg by mouth daily.   diphenhydrAMINE 25 MG tablet Commonly known as: BENADRYL Take 25 mg by mouth as needed for itching or allergies.   ferric citrate 1 GM 210 MG(Fe) tablet Commonly known as: AURYXIA Take 210 mg by mouth 3 (three) times daily with meals.   hydrALAZINE 25 MG tablet Commonly known as: APRESOLINE Take 1 tablet (25 mg total) by mouth 3 (three) times daily.   isosorbide mononitrate 60 MG 24 hr tablet Commonly known as: IMDUR Take 1 tablet (60 mg total) by mouth daily. Start taking on: February 13, 2019 What changed:   medication strength  how much to take   latanoprost 0.005 % ophthalmic solution Commonly known as: XALATAN Place 1 drop into both eyes at bedtime.    loratadine 10 MG tablet Commonly known as: CLARITIN Take 10 mg by mouth as needed for allergies.   Melatonin 5 MG Caps Take 5 mg by mouth at bedtime.   multivitamin Tabs tablet Take 1 tablet by mouth daily.   nitroGLYCERIN 0.4 MG SL tablet Commonly known as: NITROSTAT PLACE 1 TABLET BY MOUTH UNDER THE TONGUE EVERY 5 MINUTES AS NEEDED FOR CHEST PAIN. FOR UP TO 3 DOSES. IF NO RELIEF CALL 911 What changed:   how much to take  how to take this  when to take this  reasons to take this  additional instructions   pantoprazole 20 MG tablet Commonly known as: Protonix Take 1 tablet (20 mg total) by mouth daily. What changed: when to take this   Vitamin D-3 25 MCG (1000 UT) Caps Take 1,000 Units by mouth daily.      No Known Allergies Follow-up Information    Osei-Bonsu, George, MD. Schedule an appointment as soon as possible for a visit in 2 week(s).   Specialty: Internal Medicine Contact information: [id number] [place] [address] [phone]        Dorothy Spark, MD. Schedule an appointment as soon as possible for a visit in 2 week(s).   Specialty: Cardiology Why: f/u in 2 weeks. Contact information: [address] [place] [address] [phone]        Hemodialysis Follow up.   Why: Follow-up in outpatient hemodialysis center as scheduled.           The results of significant diagnostics from this hospitalization (including imaging, microbiology, ancillary and laboratory) are listed below for reference.    Significant Diagnostic Studies: Dg Chest 2 View  Result Date: 02/10/2019 CLINICAL DATA:  85 year old female with chest pain after dialysis. EXAM: CHEST - 2 VIEW COMPARISON:  01/17/2019 and earlier. FINDINGS: PA and lateral views. Stable right chest dialysis catheter. Stable cardiac size and mediastinal contours. Prior CABG. No pneumothorax or pulmonary edema. Small bilateral pleural effusions appear stable  since July. No other confluent pulmonary opacity. Exaggerated thoracic kyphosis. Osteopenia. Negative visible bowel gas pattern. IMPRESSION: Stable small bilateral pleural effusions since July. No new cardiopulmonary abnormality. Electronically Signed   By: Genevie Ann M.D.   On: 02/10/2019 12:36   Nm Myocar Multi W/spect W/wall Motion / Ef  Addendum Date: 02/11/2019    Horizontal ST segment depression ST segment depression was noted during stress in the I, II and V5 leads.  The left ventricular ejection fraction is severely decreased (<30%).  Nuclear stress  EF: 23%.  Defect 1: There is a large defect of severe severity present in the basal anteroseptal, basal inferoseptal, mid anteroseptal, mid inferoseptal, apical septal and apex location.  Defect 2: There is a large defect of severe severity present in the basal anterior, basal anterolateral, mid anterior, mid anterolateral and apical anterior location.  This is a high risk study.  Findings consistent with ischemia.  Change from prior study.  Resting perfusion study has a large, severe defect in the entire septum and apex that is partially reversible based on stress imaging. However, the anterior and part of anterolateral walls have a reversible perfusion defect on stress images that is large and severe. EF is also severely reduced, with global hypokinesis and septal dyskinesis. TID is also 1.38, suggested of global ischemia. Abnormal, high risk stress test.   Result Date: 02/11/2019  There was no ST segment deviation noted during stress.  The left ventricular ejection fraction is severely decreased (<30%).  Nuclear stress EF: 23%.  Defect 1: There is a large defect of severe severity present in the basal anteroseptal, basal inferoseptal, mid anteroseptal, mid inferoseptal, apical septal and apex location.  Defect 2: There is a large defect of severe severity present in the basal anterior, basal anterolateral, mid anterior, mid anterolateral and  apical anterior location.  This is a high risk study.  Findings consistent with ischemia.  Change from prior study.  Resting perfusion study has a large, severe defect in the entire septum and apex that is partially reversible based on stress imaging. However, the anterior and part of anterolateral walls have a reversible perfusion defect on stress images that is large and severe. EF is also severely reduced, with global hypokinesis and septal dyskinesis. TID is also 1.38, suggested of global ischemia. Abnormal, high risk stress test.   Dg Chest Port 1 View  Result Date: 01/17/2019 CLINICAL DATA:  Shortness of breath EXAM: PORTABLE CHEST 1 VIEW COMPARISON:  01/07/2019 FINDINGS: Right dialysis catheter remain in place, unchanged. Prior CABG. Cardiomegaly. No confluent opacity, effusion or edema. No acute bony abnormality. IMPRESSION: Cardiomegaly.  No active disease. Electronically Signed   By: Rolm Baptise M.D.   On: 01/17/2019 21:19    Microbiology: Recent Results (from the past 240 hour(s))  SARS Coronavirus 2 [facility] order, Performed in [facility] hospital lab) Nasopharyngeal Nasopharyngeal Swab     Status: None   Collection Time: 02/10/19  3:59 PM   Specimen: Nasopharyngeal Swab  Result Value Ref Range Status   SARS Coronavirus 2 NEGATIVE NEGATIVE Final    Comment: (NOTE) If result is NEGATIVE SARS-CoV-2 target nucleic acids are NOT DETECTED. The SARS-CoV-2 RNA is generally detectable in upper and lower  respiratory specimens during the acute phase of infection. The lowest  concentration of SARS-CoV-2 viral copies this assay can detect is 250  copies / mL. A negative result does not preclude SARS-CoV-2 infection  and should not be used as the sole basis for treatment or other  patient management decisions.  A negative result may occur with  improper specimen collection / handling, submission of specimen other  than nasopharyngeal swab, presence of viral mutation(s) within the   areas targeted by this assay, and inadequate number of viral copies  (<250 copies / mL). A negative result must be combined with clinical  observations, patient history, and epidemiological information. If result is POSITIVE SARS-CoV-2 target nucleic acids are DETECTED. The SARS-CoV-2 RNA is generally detectable in upper and lower  respiratory specimens dur ing the acute  phase of infection.  Positive  results are indicative of active infection with SARS-CoV-2.  Clinical  correlation with patient history and other diagnostic information is  necessary to determine patient infection status.  Positive results do  not rule out bacterial infection or co-infection with other viruses. If result is PRESUMPTIVE POSTIVE SARS-CoV-2 nucleic acids MAY BE PRESENT.   A presumptive positive result was obtained on the submitted specimen  and confirmed on repeat testing.  While 2019 novel coronavirus  (SARS-CoV-2) nucleic acids may be present in the submitted sample  additional confirmatory testing may be necessary for epidemiological  and / or clinical management purposes  to differentiate between  SARS-CoV-2 and other Sarbecovirus currently known to infect humans.  If clinically indicated additional testing with an alternate test  methodology [phone]) is advised. The SARS-CoV-2 RNA is generally  detectable in upper and lower respiratory sp ecimens during the acute  phase of infection. The expected result is Negative. Fact Sheet for Patients:  StrictlyIdeas.no Fact Sheet for Healthcare Providers: BankingDealers.co.za This test is not yet approved or cleared by the [place] FDA and has been authorized for detection and/or diagnosis of SARS-CoV-2 by FDA under an Emergency Use Authorization (EUA).  This EUA will remain in effect (meaning this test can be used) for the duration of the COVID-19 declaration under Section 564(b)(1) of the Act, 21  U.S.C. section 360bbb-3(b)(1), unless the authorization is terminated or revoked sooner. Performed at [facility], [place] [address], [place], [place] [address]      Labs: Basic Metabolic Panel: Recent Labs  Lab 02/10/19 1141 02/11/19 0536 02/12/19 0326  NA 137 138 137  K 4.3 4.5 4.5  CL 100 101 100  CO2 24 27 24   GLUCOSE 118* 102* 70  BUN 21 32* 40*  CREATININE 3.20* 4.19* 4.61*  CALCIUM 8.6* 8.5* 8.4*  PHOS  --  4.1 5.1*   Liver Function Tests: Recent Labs  Lab 02/10/19 1141 02/11/19 0536 02/12/19 0326  AST 31  --   --   ALT 24  --   --   ALKPHOS 84  --   --   BILITOT 0.6  --   --   PROT 6.8  --   --   ALBUMIN 3.5 3.1* 3.1*   No results for input(s): LIPASE, AMYLASE in the last 168 hours. No results for input(s): AMMONIA in the last 168 hours. CBC: Recent Labs  Lab 02/10/19 1141 02/11/19 0536 02/12/19 0326  WBC 4.4 5.5 6.1  NEUTROABS 2.6  --   --   HGB 11.5* 10.6* 10.4*  HCT 36.9 32.8* 31.8*  MCV 94.4 91.4 90.6  PLT 179 193 187   Cardiac Enzymes: No results for input(s): CKTOTAL, CKMB, CKMBINDEX, TROPONINI in the last 168 hours. BNP: BNP (last 3 results) Recent Labs    01/07/19 1850 01/17/19 2133  BNP 3,534.3* 1,643.2*    ProBNP (last 3 results) No results for input(s): PROBNP in the last 8760 hours.  CBG: Recent Labs  Lab 02/12/19 0543 02/12/19 0601 02/12/19 0811 02/12/19 1201 02/12/19 1341  GLUCAP 46* 109* 165* 64* 134*       Signed:  Irine Seal MD.  [facility] 02/12/2019, 4:48 PM

## 2019-02-12 NOTE — Progress Notes (Signed)
   Vital Signs MEWS/VS Documentation      02/12/2019 0915 02/12/2019 1108 02/12/2019 1203 02/12/2019 1242   MEWS Score:  0  0  0  0   MEWS Score Color:  Green  Green  Green  Green   Resp:  -  18  20  -   Pulse:  86  85  84  -   BP:  127/66  (!) 151/72  (!) 145/69  -   Temp:  -  98.1 F (36.7 C)  99.5 F (37.5 C)  -   O2 Device:  -  Room Air  Room Air  -   Level of Consciousness:  -  -  -  Alert           Maud Deed Tobias-Diakun 02/12/2019,2:12 PM

## 2019-02-12 NOTE — Procedures (Signed)
Patient was seen on dialysis and the procedure was supervised.  BFR 400  Via TDC BP is  127/66.   Patient appears to be tolerating treatment well-  No CP-  Cath yesterday no targets for intervention - plan she thinks is for discharge today.   Pt is 81 and had CP with HD pre admit so not appropriate for discharge to OP HD-  Needed to make sure she can tolerate HD without CP   Louis Meckel 02/12/2019

## 2019-02-12 NOTE — Progress Notes (Signed)
Subjective:   Seen in dialysis no chest pain likely home today  Using dialysis catheter and not new fistula ? Infiltrated last time  Objective:  Vitals:   02/12/19 0745 02/12/19 0815 02/12/19 0845 02/12/19 0915  BP: (!) 155/78 (!) 144/68 129/60 127/66  Pulse: 89 85 82 86  Resp:      Temp:      TempSrc:      SpO2:      Weight:      Height:        Intake/Output from previous day:  Intake/Output Summary (Last 24 hours) at 02/12/2019 0949 Last data filed at 02/12/2019 0552 Gross per 24 hour  Intake 480 ml  Output -  Net 480 ml    Physical Exam:  Chronically ill black female Dialysis catheter right subclavian Fistula LUE SEM Lungs clear No edema  Lab Results: Basic Metabolic Panel: Recent Labs    02/11/19 0536 02/12/19 0326  NA 138 137  K 4.5 4.5  CL 101 100  CO2 27 24  GLUCOSE 102* 70  BUN 32* 40*  CREATININE 4.19* 4.61*  CALCIUM 8.5* 8.4*  PHOS 4.1 5.1*   Liver Function Tests: Recent Labs    02/10/19 1141 02/11/19 0536 02/12/19 0326  AST 31  --   --   ALT 24  --   --   ALKPHOS 84  --   --   BILITOT 0.6  --   --   PROT 6.8  --   --   ALBUMIN 3.5 3.1* 3.1*   No results for input(s): LIPASE, AMYLASE in the last 72 hours. CBC: Recent Labs    02/10/19 1141 02/11/19 0536 02/12/19 0326  WBC 4.4 5.5 6.1  NEUTROABS 2.6  --   --   HGB 11.5* 10.6* 10.4*  HCT 36.9 32.8* 31.8*  MCV 94.4 91.4 90.6  PLT 179 193 187   Fasting Lipid Panel: Recent Labs    02/11/19 0536  CHOL 122  HDL 50  LDLCALC 63  TRIG 43  CHOLHDL 2.4   Thyroid Function Tests: Recent Labs    02/10/19 1955  TSH 2.019   Anemia Panel: No results for input(s): VITAMINB12, FOLATE, FERRITIN, TIBC, IRON, RETICCTPCT in the last 72 hours.  Imaging: Dg Chest 2 View  Result Date: 02/10/2019 CLINICAL DATA:  85 year old female with chest pain after dialysis. EXAM: CHEST - 2 VIEW COMPARISON:  01/17/2019 and earlier. FINDINGS: PA and lateral views. Stable right chest dialysis  catheter. Stable cardiac size and mediastinal contours. Prior CABG. No pneumothorax or pulmonary edema. Small bilateral pleural effusions appear stable since July. No other confluent pulmonary opacity. Exaggerated thoracic kyphosis. Osteopenia. Negative visible bowel gas pattern. IMPRESSION: Stable small bilateral pleural effusions since July. No new cardiopulmonary abnormality. Electronically Signed   By: Genevie Ann M.D.   On: 02/10/2019 12:36   Nm Myocar Multi W/spect W/wall Motion / Ef  Addendum Date: 02/11/2019    Horizontal ST segment depression ST segment depression was noted during stress in the I, II and V5 leads.  The left ventricular ejection fraction is severely decreased (<30%).  Nuclear stress EF: 23%.  Defect 1: There is a large defect of severe severity present in the basal anteroseptal, basal inferoseptal, mid anteroseptal, mid inferoseptal, apical septal and apex location.  Defect 2: There is a large defect of severe severity present in the basal anterior, basal anterolateral, mid anterior, mid anterolateral and apical anterior location.  This is a high risk study.  Findings consistent  with ischemia.  Change from prior study.  Resting perfusion study has a large, severe defect in the entire septum and apex that is partially reversible based on stress imaging. However, the anterior and part of anterolateral walls have a reversible perfusion defect on stress images that is large and severe. EF is also severely reduced, with global hypokinesis and septal dyskinesis. TID is also 1.38, suggested of global ischemia. Abnormal, high risk stress test.   Result Date: 02/11/2019  There was no ST segment deviation noted during stress.  The left ventricular ejection fraction is severely decreased (<30%).  Nuclear stress EF: 23%.  Defect 1: There is a large defect of severe severity present in the basal anteroseptal, basal inferoseptal, mid anteroseptal, mid inferoseptal, apical septal and apex  location.  Defect 2: There is a large defect of severe severity present in the basal anterior, basal anterolateral, mid anterior, mid anterolateral and apical anterior location.  This is a high risk study.  Findings consistent with ischemia.  Change from prior study.  Resting perfusion study has a large, severe defect in the entire septum and apex that is partially reversible based on stress imaging. However, the anterior and part of anterolateral walls have a reversible perfusion defect on stress images that is large and severe. EF is also severely reduced, with global hypokinesis and septal dyskinesis. TID is also 1.38, suggested of global ischemia. Abnormal, high risk stress test.    Cardiac Studies:  ECG: SR rate 90 no acute changes    Telemetry:  NSR 02/12/2019   Echo: 01/08/19 EF 30-35% moderate MR   Medications:   . allopurinol  100 mg Oral BID  . aspirin  81 mg Oral Daily  . atorvastatin  80 mg Oral q1800  . brimonidine  1 drop Both Eyes BID  . carvedilol  3.125 mg Oral BID WC  . Chlorhexidine Gluconate Cloth  6 each Topical Q0600  . clopidogrel  75 mg Oral Daily  . ferric citrate  210 mg Oral TID WC  . hydrALAZINE  25 mg Oral TID  . insulin aspart  0-9 Units Subcutaneous TID WC  . isosorbide mononitrate  60 mg Oral Daily  . latanoprost  1 drop Both Eyes QHS  . Melatonin  6 mg Oral QHS  . multivitamin  1 tablet Oral QHS  . pantoprazole  20 mg Oral QHS  . sodium chloride flush  3 mL Intravenous Q12H  . sodium chloride flush  3 mL Intravenous Q12H     . sodium chloride      Assessment/Plan:   Chest Pain / Angina:  Previous CABG stents to SVG IM and SVG to circumflex 01/10/19 with occluded graft  To RCA and collaterals to native distal RCA. Troponin turned mildly positive Abnormal nuclear scan but cath yesterday with stable disease and no targets for PCI Imdur increased to 60 mg daily Can consider adding Ranexa as outpatient  Will arrange outpatient f/u with Dr [name]   CRF:   Monica [name] dialysis today nurse indicates could not use fistula today due to previus infiltration   CHF:  EF 30-35% volume status ok volume controlled with dialysis  Needs right subclavian dialysis catheter removed soon as LUE fistual is functional and being used  Monica [name] 02/12/2019, 9:49 AM   Patient ID: Monica [name], female   DOB: 07-02-1931, 85 y.o.   MRN: [id number]

## 2019-02-13 LAB — HEPATITIS B CORE ANTIBODY, TOTAL: Hep B Core Total Ab: NEGATIVE

## 2019-02-14 ENCOUNTER — Other Ambulatory Visit: Payer: Self-pay | Admitting: *Deleted

## 2019-02-14 ENCOUNTER — Encounter (HOSPITAL_COMMUNITY): Payer: Self-pay | Admitting: Cardiovascular Disease

## 2019-02-14 NOTE — Patient Outreach (Signed)
[facility]) Care Management  02/14/2019  Stellar Penfold 1930-10-08 [id number]    Observational ED discharged 02/12/2019 from the hospital   Transition of care from the previous admission status 01/19/2019  RN spoke with pt today and received an update. Pt states she was at dialysis when she experienced chest pain. States initially her providers were thinking it may be something related to her stents but this was ruled out. Pt underwent testing under observation stay and released home with some changes in her medications. Pt states her daughters have been taking care of her and organizing her medications for daily administration. Medication were not reviewed however pt is aware of her medications and was able to review with this RN case manager today. Pt states she will contact her primary provider and her cardiologist to make an appointment as recommended upon her recent discharged to continue to follow up.   Plan of care updated and all goals and interventions discussed and adjusted accordingly based upon pt's progress. No needed resources at this time as pt continue to manage her care in the present of both daughter offering supportive care when needed. Pt verified sufficient transportation source for attending all her scheduled medical appointments. No other needs to address at this time. Will follow up in a few weeks based upon pt's progress.  Raina Mina, RN Care Management Coordinator [place] Office [phone]

## 2019-02-15 ENCOUNTER — Telehealth: Payer: Self-pay | Admitting: Cardiology

## 2019-02-15 DIAGNOSIS — N186 End stage renal disease: Secondary | ICD-10-CM | POA: Diagnosis not present

## 2019-02-15 DIAGNOSIS — D631 Anemia in chronic kidney disease: Secondary | ICD-10-CM | POA: Diagnosis not present

## 2019-02-15 DIAGNOSIS — N2581 Secondary hyperparathyroidism of renal origin: Secondary | ICD-10-CM | POA: Diagnosis not present

## 2019-02-15 NOTE — Telephone Encounter (Signed)
Patient's daughter, [name] [facility]) contacted regarding the pts discharge from [facility] on 02/12/2019.  Patient understands to follow up with provider Melina Copa, PA-c on 02/18/2019 at 2 pm at [facility] in [place]. Patient understands discharge instructions? Yes Patient understands medications and regiment? Yes Patient understands to bring all medications to this visit? Yes

## 2019-02-15 NOTE — Telephone Encounter (Signed)
New Message   Per Dr. Johnsie Cancel scheduled TOC with PA Melina Copa on 02/18/19 at 2:00 pm.

## 2019-02-16 ENCOUNTER — Ambulatory Visit: Payer: Medicare HMO | Admitting: Gastroenterology

## 2019-02-17 ENCOUNTER — Encounter: Payer: Self-pay | Admitting: Physician Assistant

## 2019-02-17 DIAGNOSIS — N186 End stage renal disease: Secondary | ICD-10-CM | POA: Diagnosis not present

## 2019-02-17 DIAGNOSIS — I499 Cardiac arrhythmia, unspecified: Secondary | ICD-10-CM | POA: Diagnosis not present

## 2019-02-17 DIAGNOSIS — D631 Anemia in chronic kidney disease: Secondary | ICD-10-CM | POA: Diagnosis not present

## 2019-02-17 DIAGNOSIS — R079 Chest pain, unspecified: Secondary | ICD-10-CM | POA: Diagnosis not present

## 2019-02-17 DIAGNOSIS — I10 Essential (primary) hypertension: Secondary | ICD-10-CM | POA: Diagnosis not present

## 2019-02-17 DIAGNOSIS — N2581 Secondary hyperparathyroidism of renal origin: Secondary | ICD-10-CM | POA: Diagnosis not present

## 2019-02-17 NOTE — Progress Notes (Signed)
Cardiology Office Note    Date:  02/18/2019   ID:  Monica [name], DOB 1931/05/10, MRN [id number]  PCP:  Benito Mccreedy, MD  Cardiologist:  Ena Dawley, MD  Electrophysiologist:  None   Chief Complaint: f/u cath  History of Present Illness:   Monica [name] is a 85 y.o. female with history of CAD (s/p CABG in 2017, NSTEMI 01/2019 s/p PCI), chronic combined CHF, moderate mitral regurgitation, anemia, bradycardia/sinus node dysfunction, ESRD on HD (TTS), GERD, HLD and DM who presents for post-cath follow-up.  She was recently admitted in early July forNSTEMI(hsTroponin 241) 01/07/2019 with cath showing occluded/atretic SVG to the distal RCA, severe proximal to stent and in-stent restenosis of SVG to OM with successful PTCA/with overlapping DES stent and successful DES to the ostial proximal SVG to the RI.Echo showed new reduced LVEF 30 to 35% with moderately dilated right and left atrium moderate MR and mild AI. She was placed on ASA, Plaix and metoprolol. She was readmitted in mid-July with fatigue and dizziness with junctional bradycardia HR in the 40s, therefore metoprolol was discontinued. She was then again readmitted 02/10/19 with chest pain and elevated troponin (peak 264). Nuclear stress test was abnormal, prompting repeat cath with findings above - felt to be stable disease, no focal targets for PCI. Imdur was titrated with consideration for addition of Ranexa although it does not appear this medicine has been studied in dialysis patients. She was re-trialed on low dose carvedilol for antianginal effect. She is not on ACEI/ARB/ARNI/spiro due to renal dysfunction. Last labs 02/2019 showed Hgb 10.6, LDL 63, K 4.5, Cr 4.61, TSH wnl.  She returns for follow-up today with her daughter. She felt well after discharge but yesterday during HD had episode of return of chest pain and EMS was called. She brings in some notes from dialysis and SBP was 198 at that time. The patient took 2 SL NTG herself  which relieved the pain. She declined to go with EMS given that she had recently been readmitted twice. She went home and rested and felt better. She has felt generally well today. BP remains above goal. She denies any low BP. At one point she says she was on amlodipine but is unsure why it was stopped. This was not recent, but it is a less ideal choice given her LV dysfunction.    Past Medical History:  Diagnosis Date   Anemia    Asthma    Chronic combined systolic and diastolic CHF (congestive heart failure) ([place])    a. Echo 11/19 [facility]):  EF 55-65, normal wall motion, normal diastolic function. b. Echo 01/2019 EF 30-35%, moderate MR, mild AI.   Coronary artery disease    a. s/p CABG 2017. b.s/p NSTEMI 11/19 [facility]) >> S-PDA 100 >>PCI: 2.25 x 24 mm Synergy DES to S-OM. c. NSTEMI 01/2019 - occluded SVG-distal RCA, severe proximal to stent/ISR of SVG-OM s/p overlapping DES, DES to ostial prox SVG to the RI.    ESRD (end stage renal disease)    Dialysis Tu, Th, Sa   Essential hypertension    Gastroesophageal reflux disease    History of blood transfusion    Hyperlipidemia    Junctional bradycardia    Moderate mitral regurgitation    Peptic ulcer    Protein calorie malnutrition (HCC)    Sinus node dysfunction (HCC)    Type 2 diabetes mellitus ([place])     Past Surgical History:  Procedure Laterality Date   ABDOMINAL HYSTERECTOMY  CARDIAC CATHETERIZATION N/A 09/17/2015   Procedure: Left Heart Cath and Coronary Angiography;  Surgeon: Jettie Booze, MD;  Location: [place] [facility];  Service: Cardiovascular;  Laterality: N/A;   COLON SURGERY     CORONARY ARTERY BYPASS GRAFT N/A 09/19/2015   Procedure: CORONARY ARTERY BYPASS GRAFTING (CABG) TIMES FOUR USING BILATARAL SAPHENOUS VEIN GRAFTS AND LEFT INTERNAL MAMMARY ARTERY;  Surgeon: [name], MD;  Location: [facility];  Service: Open Heart Surgery;  Laterality: N/A;   CORONARY STENT INTERVENTION  N/A 01/10/2019   Procedure: CORONARY STENT INTERVENTION;  Surgeon: Leonie Man, MD;  Location: [place] [facility];  Service: Cardiovascular;  Laterality: N/A;   LEFT HEART CATH AND CORS/GRAFTS ANGIOGRAPHY N/A 01/10/2019   Procedure: LEFT HEART CATH AND CORS/GRAFTS ANGIOGRAPHY;  Surgeon: Leonie Man, MD;  Location: [place] [facility];  Service: Cardiovascular;  Laterality: N/A;   LEFT HEART CATH AND CORS/GRAFTS ANGIOGRAPHY N/A 02/11/2019   Procedure: LEFT HEART CATH AND CORS/GRAFTS ANGIOGRAPHY;  Surgeon: Burnell Blanks, MD;  Location: [place] [facility];  Service: Cardiovascular;  Laterality: N/A;   TEE WITHOUT CARDIOVERSION N/A 09/19/2015   Procedure: TRANSESOPHAGEAL ECHOCARDIOGRAM (TEE);  Surgeon: [name], MD;  Location: [place];  Service: Open Heart Surgery;  Laterality: N/A;    Current Medications: Current Meds  Medication Sig   acetaminophen (TYLENOL) 500 MG tablet Take 1 tablet (500 mg total) by mouth every 6 (six) hours as needed for mild pain or headache.   albuterol (PROVENTIL HFA;VENTOLIN HFA) 108 (90 Base) MCG/ACT inhaler Inhale 2 puffs into the lungs every 6 (six) hours as needed for wheezing or shortness of breath.   allopurinol (ZYLOPRIM) 100 MG tablet Take 100 mg by mouth 2 (two) times daily.    aspirin EC 81 MG EC tablet Take 1 tablet (81 mg total) by mouth daily.   atorvastatin (LIPITOR) 80 MG tablet Take 80 mg by mouth daily at 6 PM.    brimonidine (ALPHAGAN P) 0.1 % SOLN Place 1 drop into both eyes 2 (two) times daily.   carvedilol (COREG) 3.125 MG tablet Take 1 tablet (3.125 mg total) by mouth 2 (two) times daily with a meal.   Cholecalciferol (VITAMIN D-3) 1000 units CAPS Take 1,000 Units by mouth daily.    clopidogrel (PLAVIX) 75 MG tablet Take 75 mg by mouth daily.    diphenhydrAMINE (BENADRYL) 25 MG tablet Take 25 mg by mouth as needed for itching or allergies.    ferric citrate (AURYXIA) 1 GM 210 MG(Fe) tablet Take 210 mg by mouth 3  (three) times daily with meals.    hydrALAZINE (APRESOLINE) 25 MG tablet Take 1 tablet (25 mg total) by mouth 3 (three) times daily.   isosorbide mononitrate (IMDUR) 60 MG 24 hr tablet Take 1 tablet (60 mg total) by mouth daily.   latanoprost (XALATAN) 0.005 % ophthalmic solution Place 1 drop into both eyes at bedtime.   loratadine (CLARITIN) 10 MG tablet Take 10 mg by mouth as needed for allergies.    Melatonin 5 MG CAPS Take 5 mg by mouth at bedtime.    multivitamin (RENA-VIT) TABS tablet Take 1 tablet by mouth daily.   nitroGLYCERIN (NITROSTAT) 0.4 MG SL tablet Place 0.4 mg under the tongue every 5 (five) minutes as needed for chest pain.   pantoprazole (PROTONIX) 20 MG tablet Take 20 mg by mouth daily.     Allergies:   Patient has no known allergies.   Social History   Socioeconomic History  Marital status: Widowed    Spouse name: Not on file   Number of children: Not on file   Years of education: Not on file   Highest education level: Not on file  Occupational History   Occupation: Retired  Scientist, product/process development strain: Not hard at [employer] insecurity    Worry: Never true    Inability: Never true   Transportation needs    Medical: No    Non-medical: No  Tobacco Use   Smoking status: Never Smoker   Smokeless tobacco: Former Systems developer    Types: Snuff  Substance and Sexual Activity   Alcohol use: No    Alcohol/week: 0.0 standard drinks   Drug use: No   Sexual activity: Not on file  Lifestyle   Physical activity    Days per week: Not on file    Minutes per session: Not on file   Stress: Not on file  Relationships   Social connections    Talks on phone: More than three times a week    Gets together: More than three times a week    Attends religious service: More than 4 times per year    Active member of club or organization: No    Attends meetings of clubs or organizations: Never    Relationship status: Widowed  Other Topics  Concern   Not on file  Social History Narrative   Not on file     Family History:  The patient's family history includes Heart attack in her father.  ROS:   Please see the history of present illness.  All other systems are reviewed and otherwise negative.    EKGs/Labs/Other Studies Reviewed:    Studies reviewed were summarized above.  EKG:  EKG is ordered today.  The EKG ordered today demonstrates NSR 84bpm, nonspecific STT chnages (improved from prior)  Recent Labs: 01/17/2019: B Natriuretic Peptide 1,643.2 02/10/2019: ALT 24; TSH 2.019 02/12/2019: BUN 40; Creatinine, Ser 4.61; Hemoglobin 10.4; Platelets 187; Potassium 4.5; Sodium 137  Recent Lipid Panel    Component Value Date/Time   CHOL 122 02/11/2019 0536   CHOL 125 09/08/2018 0737   TRIG 43 02/11/2019 0536   HDL 50 02/11/2019 0536   HDL 50 09/08/2018 0737   CHOLHDL 2.4 02/11/2019 0536   VLDL 9 02/11/2019 0536   LDLCALC 63 02/11/2019 0536   LDLCALC 53 09/08/2018 0737    PHYSICAL EXAM:    VS:  BP (!) 148/70    Pulse 87    Ht 5\' 2"  (1.575 m)    Wt 164 lb 12.8 oz (74.8 kg)    SpO2 97%    BMI 30.14 kg/m   BMI: Body mass index is 30.14 kg/m.  GEN: Well nourished, well developed AAF, in no acute distress HEENT: normocephalic, atraumatic Neck: no JVD, carotid bruits, or masses Cardiac: RRR; no murmurs, rubs, or gallops, trace BLE edema  Respiratory:  clear to auscultation bilaterally, normal work of breathing GI: soft, nontender, nondistended, + BS MS: no deformity or atrophy Skin: warm and dry, no rash Neuro:  Alert and Oriented x 3, Strength and sensation are intact, follows commands Psych: euthymic mood, full affect  Wt Readings from Last 3 Encounters:  02/18/19 164 lb 12.8 oz (74.8 kg)  02/12/19 158 lb 11.7 oz (72 kg)  01/26/19 165 lb (74.8 kg)     ASSESSMENT & PLAN:   1. CAD with recurrent angina - in the setting of uncontrolled HTN. Cannot escalate BB further  given recent junctional bradycardia; would  leave as-is. Ranexa has not been studied in dialysis patients therefore is poor choice. Amlodipine was considered but less ideal given LV dysfunction. I would recommend to titrate Imdur to 1.5 tablets daily. I do not think that this would fully address her HTN so would also recommend increasing hydralazine to 2 tablets (50mg ) TID. Will bring her back in in 1 week for reassessment as it will be important to achieve maximum BP control/antianginal therapy to keep her out of the hospital if possible. She and her daughter would like to hold off on sending new rx in since they just got recent fills and can use up what they have. If her regimen is working well at next [place], will need to be consolidated into new rx. 2. Chronic combined CHF - work on BP control as above. Volume controlled by HD. 3. Moderate mitral regurgitation - follow clinically for now. Poor candidate for invasive procedures given HD and advanced age. 4. Anemia - per IM/renal. 5. Sinus node dysfunction - she recently had a monitor placed and it is in progress; results are still pending and will go to ordering provider.  Disposition: F/u with Dr. [name] care team APP in 1 week - if unavailable, also familiar to V. Bhagat and Kathleen Argue.   Medication Adjustments/Labs and Tests Ordered: Current medicines are reviewed at length with the patient today.  Concerns regarding medicines are outlined above. Medication changes, Labs and Tests ordered today are summarized above and listed in the Patient Instructions accessible in Encounters.   Signed, Charlie Pitter, PA-C  02/18/2019 2:18 PM    [facility] [address], [place],   [id number] Phone: [phone]; Fax: [phone]

## 2019-02-18 ENCOUNTER — Other Ambulatory Visit: Payer: Self-pay

## 2019-02-18 ENCOUNTER — Telehealth: Payer: Self-pay | Admitting: Physician Assistant

## 2019-02-18 ENCOUNTER — Encounter: Payer: Self-pay | Admitting: Physician Assistant

## 2019-02-18 ENCOUNTER — Ambulatory Visit (INDEPENDENT_AMBULATORY_CARE_PROVIDER_SITE_OTHER): Payer: Medicare HMO | Admitting: Physician Assistant

## 2019-02-18 ENCOUNTER — Ambulatory Visit: Payer: Medicare HMO | Admitting: Gastroenterology

## 2019-02-18 VITALS — BP 148/70 | HR 87 | Ht 62.0 in | Wt 164.8 lb

## 2019-02-18 DIAGNOSIS — D649 Anemia, unspecified: Secondary | ICD-10-CM

## 2019-02-18 DIAGNOSIS — I5042 Chronic combined systolic (congestive) and diastolic (congestive) heart failure: Secondary | ICD-10-CM | POA: Diagnosis not present

## 2019-02-18 DIAGNOSIS — I495 Sick sinus syndrome: Secondary | ICD-10-CM | POA: Diagnosis not present

## 2019-02-18 DIAGNOSIS — I2510 Atherosclerotic heart disease of native coronary artery without angina pectoris: Secondary | ICD-10-CM

## 2019-02-18 DIAGNOSIS — N186 End stage renal disease: Secondary | ICD-10-CM | POA: Diagnosis not present

## 2019-02-18 DIAGNOSIS — E782 Mixed hyperlipidemia: Secondary | ICD-10-CM | POA: Diagnosis not present

## 2019-02-18 DIAGNOSIS — Z9861 Coronary angioplasty status: Secondary | ICD-10-CM

## 2019-02-18 DIAGNOSIS — I119 Hypertensive heart disease without heart failure: Secondary | ICD-10-CM | POA: Diagnosis not present

## 2019-02-18 DIAGNOSIS — I340 Nonrheumatic mitral (valve) insufficiency: Secondary | ICD-10-CM

## 2019-02-18 DIAGNOSIS — Z992 Dependence on renal dialysis: Secondary | ICD-10-CM | POA: Diagnosis not present

## 2019-02-18 DIAGNOSIS — E1151 Type 2 diabetes mellitus with diabetic peripheral angiopathy without gangrene: Secondary | ICD-10-CM | POA: Diagnosis not present

## 2019-02-18 DIAGNOSIS — M1A49X0 Other secondary chronic gout, multiple sites, without tophus (tophi): Secondary | ICD-10-CM | POA: Diagnosis not present

## 2019-02-18 DIAGNOSIS — Z1329 Encounter for screening for other suspected endocrine disorder: Secondary | ICD-10-CM | POA: Diagnosis not present

## 2019-02-18 MED ORDER — HYDRALAZINE HCL 25 MG PO TABS: 50.0000 mg | ORAL_TABLET | Freq: Three times a day (TID) | ORAL | 3 refills | Status: DC

## 2019-02-18 MED ORDER — ISOSORBIDE MONONITRATE ER 60 MG PO TB24: 75.0000 mg | ORAL_TABLET | Freq: Every day | ORAL | 3 refills | Status: DC

## 2019-02-18 NOTE — Telephone Encounter (Signed)
New message   Pt c/o medication issue:  1. Name of Medication: hydrALAZINE (APRESOLINE) 25 MG tablet 2. How are you currently taking this medication (dosage and times per day)? n/a  3. Are you having a reaction (difficulty breathing--STAT)? no  4. What is your medication issue? Daughter [name] calling to clarify when medication should be taken and he dosage

## 2019-02-18 NOTE — Patient Instructions (Addendum)
Medication Instructions:  Your physician has recommended you make the following change in your medication:  1.  INCREASE the Imdur to 1 1/2 tablet daily 2.  INCREASE the Hydralazine to 2 three times  a day  If you need a refill on your cardiac medications before your next appointment, please call your pharmacy.   Lab work: None ordered If you have labs (blood work) drawn today and your tests are completely normal, you will receive your results only by: Marland Kitchen MyChart Message (if you have MyChart) OR . A paper copy in the mail If you have any lab test that is abnormal or we need to change your treatment, we will call you to review the results.  Testing/Procedures: None ordered  Follow-Up: At [facility], you and your health needs are our priority.  As part of our continuing mission to provide you with exceptional heart care, we have created designated Provider Care Teams.  These Care Teams include your primary Cardiologist (physician) and Advanced Practice Providers (APPs -  Physician Assistants and Nurse Practitioners) who all work together to provide you with the care you need, when you need it. Marland Kitchen You are scheduled for a follow-up appointment 02/25/2019 at 10:15 ti see Cecilie Kicks, NP  Any Other Special Instructions Will Be Listed Below (If Applicable).

## 2019-02-18 NOTE — Telephone Encounter (Signed)
Returned pt's daughter, [name] call.  She was asking when she should she start with the new dose of medications for her mom. I advised since it was later in the day, to start tomorrow.

## 2019-02-19 DIAGNOSIS — N186 End stage renal disease: Secondary | ICD-10-CM | POA: Diagnosis not present

## 2019-02-19 DIAGNOSIS — D631 Anemia in chronic kidney disease: Secondary | ICD-10-CM | POA: Diagnosis not present

## 2019-02-19 DIAGNOSIS — N2581 Secondary hyperparathyroidism of renal origin: Secondary | ICD-10-CM | POA: Diagnosis not present

## 2019-02-22 DIAGNOSIS — N186 End stage renal disease: Secondary | ICD-10-CM | POA: Diagnosis not present

## 2019-02-22 DIAGNOSIS — N2581 Secondary hyperparathyroidism of renal origin: Secondary | ICD-10-CM | POA: Diagnosis not present

## 2019-02-22 DIAGNOSIS — D631 Anemia in chronic kidney disease: Secondary | ICD-10-CM | POA: Diagnosis not present

## 2019-02-24 DIAGNOSIS — N2581 Secondary hyperparathyroidism of renal origin: Secondary | ICD-10-CM | POA: Diagnosis not present

## 2019-02-24 DIAGNOSIS — N186 End stage renal disease: Secondary | ICD-10-CM | POA: Diagnosis not present

## 2019-02-24 DIAGNOSIS — D631 Anemia in chronic kidney disease: Secondary | ICD-10-CM | POA: Diagnosis not present

## 2019-02-24 NOTE — Progress Notes (Signed)
Cardiology Office Note   Date:  02/25/2019   ID:  Monica [name], DOB 12-24-1930, MRN [id number]  PCP:  Benito Mccreedy, MD  Cardiologist:  Dr. [name]    Chief Complaint  Patient presents with  . Hypertension      History of Present Illness: Monica [name] is a 85 y.o. female who presents for BP follow up.  She has a history of CAD (s/p CABG in 2017, NSTEMI 01/2019 s/p PCI), chronic combined CHF, moderate mitral regurgitation, anemia, bradycardia/sinus node dysfunction, ESRD on HD (TTS), GERD, HLD and DM who presents for post-cath follow-up.  She was recently admitted in early July forNSTEMI(hsTroponin 241) 01/07/2019 with cath showing occluded/atretic SVG to the distal RCA, severe proximal to stent and in-stent restenosis of SVG to OM with successful PTCA/with overlapping DES stent and successful DES to the ostial proximal SVG to the RI.Echo showed new reduced LVEF 30 to 35% with moderately dilated right and left atrium moderate MR and mild AI. She was placed on ASA, Plaix and metoprolol. She was readmitted in mid-July with fatigue and dizziness with junctional bradycardia HR in the 40s, therefore metoprolol was discontinued. She was then again readmitted 02/10/19 with chest pain and elevated troponin (peak 264). Nuclear stress test was abnormal, prompting repeat cath with findings above - felt to be stable disease, no focal targets for PCI. Imdur was titrated with consideration for addition of Ranexa although it does not appear this medicine has been studied in dialysis patients. She was re-trialed on low dose carvedilol for antianginal effect. She is not on ACEI/ARB/ARNI/spiro due to renal dysfunction. Last labs 02/2019 showed Hgb 10.6, LDL 63, K 4.5, Cr 4.61, TSH wnl.  Last visit 02/18/19 She felt well after discharge but yesterday during HD had episode of return of chest pain and EMS was called. She brings in some notes from dialysis and SBP was 198 at that time. The patient took 2 SL NTG  herself which relieved the pain. She declined to go with EMS given that she had recently been readmitted twice. She went home and rested and felt better. She has felt generally well today. BP remains above goal. She denies any low BP. At one point she says she was on amlodipine but is unsure why it was stopped. This was not recent, but it is a less ideal choice given her LV dysfunction.   On that visit imdur and hydralazine increased.  Monitor placed but results not complete.  A few strips were reviewed and SR with PVCs, PACs and salvo PVCs.  No bradycardia.   Today her BP is improved at times.  She still has some chest pain with dialysis at end but due to elevated BP.  She does not take her meds prior to dialysis so by end very high.  Renal has decreased how much fluid they are removing and she feels that has helped a lot.   Her son is with her today.  She should have some family member with her for visits.  No SOB and no pain except at end of dialysis.  Past Medical History:  Diagnosis Date  . Anemia   . Asthma   . Chronic combined systolic and diastolic CHF (congestive heart failure) ([place])    a. Echo 11/19 [facility]):  EF 55-65, normal wall motion, normal diastolic function. b. Echo 01/2019 EF 30-35%, moderate MR, mild AI.  Marland Kitchen Coronary artery disease    a. s/p CABG 2017. b.s/p NSTEMI 11/19 [facility]) >> S-PDA  100 >>PCI: 2.25 x 24 mm Synergy DES to S-OM. c. NSTEMI 01/2019 - occluded SVG-distal RCA, severe proximal to stent/ISR of SVG-OM s/p overlapping DES, DES to ostial prox SVG to the RI.   Marland Kitchen ESRD (end stage renal disease)    Dialysis Tu, Th, Sa  . Essential hypertension   . Gastroesophageal reflux disease   . History of blood transfusion   . Hyperlipidemia   . Junctional bradycardia   . Moderate mitral regurgitation   . Peptic ulcer   . Protein calorie malnutrition ([place])   . Sinus node dysfunction ([facility])   . Type 2 diabetes mellitus ([place])     Past Surgical History:   Procedure Laterality Date  . ABDOMINAL HYSTERECTOMY    . CARDIAC CATHETERIZATION N/A 09/17/2015   Procedure: Left Heart Cath and Coronary Angiography;  Surgeon: Jettie Booze, MD;  Location: [place] [facility];  Service: Cardiovascular;  Laterality: N/A;  . COLON SURGERY    . CORONARY ARTERY BYPASS GRAFT N/A 09/19/2015   Procedure: CORONARY ARTERY BYPASS GRAFTING (CABG) TIMES FOUR USING BILATARAL SAPHENOUS VEIN GRAFTS AND LEFT INTERNAL MAMMARY ARTERY;  Surgeon: [name], MD;  Location: [place];  Service: Open Heart Surgery;  Laterality: N/A;  . CORONARY STENT INTERVENTION N/A 01/10/2019   Procedure: CORONARY STENT INTERVENTION;  Surgeon: Leonie Man, MD;  Location: [place] [facility];  Service: Cardiovascular;  Laterality: N/A;  . LEFT HEART CATH AND CORS/GRAFTS ANGIOGRAPHY N/A 01/10/2019   Procedure: LEFT HEART CATH AND CORS/GRAFTS ANGIOGRAPHY;  Surgeon: Leonie Man, MD;  Location: [place] [facility];  Service: Cardiovascular;  Laterality: N/A;  . LEFT HEART CATH AND CORS/GRAFTS ANGIOGRAPHY N/A 02/11/2019   Procedure: LEFT HEART CATH AND CORS/GRAFTS ANGIOGRAPHY;  Surgeon: Burnell Blanks, MD;  Location: [place] [facility];  Service: Cardiovascular;  Laterality: N/A;  . TEE WITHOUT CARDIOVERSION N/A 09/19/2015   Procedure: TRANSESOPHAGEAL ECHOCARDIOGRAM (TEE);  Surgeon: [name], MD;  Location: [place];  Service: Open Heart Surgery;  Laterality: N/A;     Current Outpatient Medications  Medication Sig Dispense Refill  . acetaminophen (TYLENOL) 500 MG tablet Take 1 tablet (500 mg total) by mouth every 6 (six) hours as needed for mild pain or headache. 30 tablet 0  . albuterol (PROVENTIL HFA;VENTOLIN HFA) 108 (90 Base) MCG/ACT inhaler Inhale 2 puffs into the lungs every 6 (six) hours as needed for wheezing or shortness of breath.    . allopurinol (ZYLOPRIM) 100 MG tablet Take 100 mg by mouth 2 (two) times daily.     Marland Kitchen aspirin EC 81 MG EC tablet Take 1 tablet (81 mg  total) by mouth daily.    Marland Kitchen atorvastatin (LIPITOR) 80 MG tablet Take 80 mg by mouth daily at 6 PM.     . brimonidine (ALPHAGAN P) 0.1 % SOLN Place 1 drop into both eyes 2 (two) times daily.    . carvedilol (COREG) 3.125 MG tablet Take 1 tablet (3.125 mg total) by mouth 2 (two) times daily with a meal. 60 tablet 2  . Cholecalciferol (VITAMIN D-3) 1000 units CAPS Take 1,000 Units by mouth daily.     . clopidogrel (PLAVIX) 75 MG tablet Take 75 mg by mouth daily.     . diphenhydrAMINE (BENADRYL) 25 MG tablet Take 25 mg by mouth as needed for itching or allergies.     . ferric citrate (AURYXIA) 1 GM 210 MG(Fe) tablet Take 210 mg by mouth 3 (three) times daily with meals.     Marland Kitchen  hydrALAZINE (APRESOLINE) 25 MG tablet Take 2 tablets (50 mg total) by mouth 3 (three) times daily. 270 tablet 3  . isosorbide mononitrate (IMDUR) 60 MG 24 hr tablet Take 1.5 tablets (90 mg total) by mouth daily. 90 tablet 3  . latanoprost (XALATAN) 0.005 % ophthalmic solution Place 1 drop into both eyes at bedtime.    Marland Kitchen loratadine (CLARITIN) 10 MG tablet Take 10 mg by mouth as needed for allergies.     . Melatonin 5 MG CAPS Take 5 mg by mouth at bedtime.     . multivitamin (RENA-VIT) TABS tablet Take 1 tablet by mouth daily.    . nitroGLYCERIN (NITROSTAT) 0.4 MG SL tablet Place 0.4 mg under the tongue every 5 (five) minutes as needed for chest pain.    . pantoprazole (PROTONIX) 20 MG tablet Take 20 mg by mouth daily.     No current facility-administered medications for this visit.     Allergies:   Patient has no known allergies.    Social History:  The patient  reports that she has never smoked. She has quit using smokeless tobacco.  Her smokeless tobacco use included snuff. She reports that she does not drink alcohol or use drugs.   Family History:  The patient's family history includes Heart attack in her father.    ROS:  General:no colds or fevers, some weight increase, ? If due to less fluid removed with dialysis  Skin:no rashes or ulcers HEENT:no blurred vision, no congestion CV:see HPI PUL:see HPI GI:no diarrhea constipation or melena, no indigestion GU:no hematuria, no dysuria MS:no joint pain, no claudication Neuro:no syncope, no lightheadedness Endo:no diabetes, no thyroid disease  Wt Readings from Last 3 Encounters:  02/25/19 167 lb (75.8 kg)  02/18/19 164 lb 12.8 oz (74.8 kg)  02/12/19 158 lb 11.7 oz (72 kg)     PHYSICAL EXAM: VS:  BP (!) 154/60   Pulse 87   Ht 5\' 2"  (1.575 m)   Wt 167 lb (75.8 kg)   SpO2 98%   BMI 30.54 kg/m  , BMI Body mass index is 30.54 kg/m. General:Pleasant affect, NAD Skin:Warm and dry, brisk capillary refill HEENT:normocephalic, sclera clear, mucus membranes moist Neck:supple, no JVD, no bruits  Heart:S1S2 RRR without murmur, gallup, rub or click Lungs:clear without rales, rhonchi, or wheezes [id number], non tender, + BS, do not palpate liver spleen or masses Ext:no lower ext edema, 2+ radial pulses Neuro:alert and oriented x 3, MAE, follows commands, + facial symmetry    EKG:  EKG is NOT ordered today.    Recent Labs: 01/17/2019: B Natriuretic Peptide 1,643.2 02/10/2019: ALT 24; TSH 2.019 02/12/2019: BUN 40; Creatinine, Ser 4.61; Hemoglobin 10.4; Platelets 187; Potassium 4.5; Sodium 137    Lipid Panel    Component Value Date/Time   CHOL 122 02/11/2019 0536   CHOL 125 09/08/2018 0737   TRIG 43 02/11/2019 0536   HDL 50 02/11/2019 0536   HDL 50 09/08/2018 0737   CHOLHDL 2.4 02/11/2019 0536   VLDL 9 02/11/2019 0536   LDLCALC 63 02/11/2019 0536   LDLCALC 53 09/08/2018 0737       Other studies Reviewed: Additional studies/ records that were reviewed today include: . Cardiac cath 02/11/19  Dist LM to Prox LAD lesion is 30% stenosed.  Mid LAD-1 lesion is 95% stenosed.  Mid LAD-2 lesion is 100% stenosed.  Ost Cx to Mid Cx lesion is 100% stenosed.  Prox RCA lesion is 70% stenosed.  Prox RCA to Mid RCA lesion is 90%  stenosed.  Mid RCA  lesion is 95% stenosed.  Dist RCA lesion is 100% stenosed with 100% stenosed side branch in RPAV.  Ramus lesion is 100% stenosed.  LIMA and is normal in caliber.  There is no competitive flow  SVG.  The graft exhibits severe diffuse disease.  Origin to Prox Graft lesion is 90% stenosed.  Prox Graft to Mid Graft lesion is 80% stenosed.  Dist Graft lesion is 100% stenosed.  SVG.  SVG.  Previously placed Origin to Prox Graft stent (unknown type) is widely patent.  Previously placed Dist Graft stent (unknown type) is widely patent.   1. Chronic occlusion of the LAD is known. The LIMA to the LAD is patent 2. Chronic occlusion of the Circumflex is known. The SVG to the ramus intermediate is patent with patent proximal stent.  The SVG to the OM is patent with patent distal body stent.  3. Chronic occlusion mid RCA. The SVG to the RCA is known to be occluded (not selectively engaged today).   Recommendations: No focal targets for PCI. Continue medical management. Consider addition of Ranexa.  Diagnostic Dominance: Right   Cardiac cath [id number]  LV end diastolic pressure is normal.  Dist LM to Prox LAD lesion is 30% stenosed.  Mid LAD-1 lesion is 95% stenosed. Mid LAD-2 lesion is 100% stenosed.  Ramus lesion is 100% stenosed.  Ost Cx to Mid Cx lesion is 100% stenosed.  Prox RCA lesion is 70% stenosed. Prox RCA to Mid RCA lesion is 90% stenosed. Mid RCA lesion is 95% stenosed. Dist RCA lesion is 100% stenosed with 100% stenosed side branch in RPAV.  -------------GRAFTS--------------  LIMA-LAD graft was visualized by angiography and is normal in caliber. There is no competitive flow  SVG-dRCA graft was visualized by angiography. The graft exhibits severe diffuse disease. Origin to Prox Graft lesion is 90% stenosed. Prox Graft to Mid Graft lesion is 80% stenosed follwed by Dist Graft lesion is 100% stenosed.  -----------INTERVENTION----------------  SVG-OM graft was  visualized by angiography.  Mid Graft lesion is 75% stenosed.  A drug-eluting stent was successfully placed using a STENT SYNERGY DES 2.5X12. Postdilated 2.8 mm  ---------------------------  Mid Graft to Dist Graft Stent is 85% stenosed. Balloon angioplasty was performed using a BALLOON [place].  Post intervention, there is a 0% residual stenosis -in both the new stent, stent overlap in the previous stent.  SVG-RI graft was visualized by angiography. Origin to Prox Graft lesion is 85% stenosed.  A drug-eluting stent was successfully placed using a STENT SYNERGY DES 2.5X20. -Postdilated to 2.7 mm  Post intervention, there is a 0% residual stenosis.   SUMMARY  Severe native vessel CAD: 100% ostial Circumflex (LCx) and ostial Ramus Intermedius, diffuse calcified proximal LAD followed by 95% stenosis just after 1st Diag followed by 100% -all CTO; severe diffuse proximal to mid RCA disease with CTO of the distal RCA  Essentially occluded/atretic SVG-dRCA  Severe proximal to stent and in-stent restenosis in SVG-OM ? Successful PTCA followed by DES PCI with overlapping DES stent (Synergy 2.5 mm x 12 mm - 2.8 mm)  Severe ostial-proximal SVG-RI ? Successful DES PCI (Synergy 2.5 mm x 20 mm--2.7 mm)  Normal LVEDP despite significant systemic hypertension   RECOMMENDATIONS  Transfer to cardiology nursing unit for post PCI care after sheath removal in PACU holding area  Aggressive restricted medication with blood pressure management  Dialysis as indicated  We will need to treat occluded RCA system medically  Echo 01/08/2019  IMPRESSIONS    1. The left ventricle has moderate-severely reduced systolic function, with an ejection fraction of 30-35%. The cavity size was normal. There is moderate concentric left ventricular hypertrophy. Left ventricular diastolic Doppler parameters are  consistent with pseudonormalization. Elevated left atrial and left ventricular  end-diastolic pressures.  2. The right ventricle has moderately reduced systolic function. The cavity was normal. There is no increase in right ventricular wall thickness. Right ventricular systolic pressure is normal.  3. Left atrial size was moderately dilated.  4. Right atrial size was mildly dilated.  5. The mitral valve is degenerative. Moderate thickening of the mitral valve leaflet. There is mild mitral annular calcification present. Mitral valve regurgitation is moderate by color flow Doppler.  6. The aortic valve is tricuspid. Mild thickening of the aortic valve. Aortic valve regurgitation is mild by color flow Doppler.  SUMMARY   When compared to the prior study there is now new LV dysfunction with diffuse hypokinesis and LVEF 30-35%, RVEF is also moderately decreased. LVEDP is elevated.Mitral regurgitation is at least moderate.  FINDINGS  Left Ventricle: The left ventricle has moderate-severely reduced systolic function, with an ejection fraction of 30-35%. The cavity size was normal. There is moderate concentric left ventricular hypertrophy. Left ventricular diastolic Doppler parameters  are consistent with pseudonormalization. Elevated left atrial and left ventricular end-diastolic pressures  Right Ventricle: The right ventricle has moderately reduced systolic function. The cavity was normal. There is no increase in right ventricular wall thickness. Right ventricular systolic pressure is normal.  Left Atrium: Left atrial size was moderately dilated.  Right Atrium: Right atrial size was mildly dilated. Right atrial pressure is estimated at 10 mmHg.  Interatrial Septum: No atrial level shunt detected by color flow Doppler.  Pericardium: There is no evidence of pericardial effusion.  Mitral Valve: The mitral valve is degenerative in appearance. Moderate thickening of the mitral valve leaflet. There is mild mitral annular calcification present. Mitral valve regurgitation is  moderate by color flow Doppler.  Tricuspid Valve: The tricuspid valve is normal in structure. Tricuspid valve regurgitation was not visualized by color flow Doppler.  Aortic Valve: The aortic valve is tricuspid Mild thickening of the aortic valve. Aortic valve regurgitation is mild by color flow Doppler.  Pulmonic Valve: The pulmonic valve was normal in structure. Pulmonic valve regurgitation was not assessed by color flow Doppler.  Venous: The inferior vena cava is normal in size with greater than 50% respiratory variability.    ASSESSMENT AND PLAN:  1.  HTN still elevated will increase imdur to 120 mg daily, she will keep a log of BP on non dialysis days and call us these in a week.  Hx of bradycardia so did not wish to increase BB, hydralazine recently increased.  Follow up in 4 weeks to re-eval  2.  CAD with angina (in both native and grafts) with elevated BP after dialysis, now with new dialysis protocol this is improved.  Recent hx 01/10/19 PTCA followed by DES PCI with overlapping DES stent (Synergy 2.5 mm x 12 mm - 2.8 mm) and DES PCI (Synergy 2.5 mm x 20 mm--2.7 mm) and recath 02/11/2019 with stable anatomy with no further interventions.  The 02/11/2019 cath was done for angina.  Occluded RCA is treated medically.    3. Chronic combined CHF stable and volume controlled by HD  4.  ESRD on HD followed by Renal and new adjustments that seem to be helping her pain.   5.  Sinus node dysfunction, she  is still wearing monitor. No brady so far.     Current medicines are reviewed with the patient today.  The patient Has no concerns regarding medicines.  The following changes have been made:  See above Labs/ tests ordered today include:see above  Disposition:   FU:  see above  Signed, Cecilie Kicks, NP  02/25/2019 10:50 AM    [address], [address], [place], [place] Phone: [phone]; Fax: [phone]

## 2019-02-25 ENCOUNTER — Ambulatory Visit (INDEPENDENT_AMBULATORY_CARE_PROVIDER_SITE_OTHER): Payer: Medicare HMO | Admitting: Cardiology

## 2019-02-25 ENCOUNTER — Other Ambulatory Visit: Payer: Self-pay

## 2019-02-25 ENCOUNTER — Encounter: Payer: Self-pay | Admitting: Cardiology

## 2019-02-25 VITALS — BP 154/60 | HR 87 | Ht 62.0 in | Wt 167.0 lb

## 2019-02-25 DIAGNOSIS — N186 End stage renal disease: Secondary | ICD-10-CM | POA: Diagnosis not present

## 2019-02-25 DIAGNOSIS — I2510 Atherosclerotic heart disease of native coronary artery without angina pectoris: Secondary | ICD-10-CM

## 2019-02-25 DIAGNOSIS — I10 Essential (primary) hypertension: Secondary | ICD-10-CM

## 2019-02-25 DIAGNOSIS — I5042 Chronic combined systolic (congestive) and diastolic (congestive) heart failure: Secondary | ICD-10-CM | POA: Diagnosis not present

## 2019-02-25 DIAGNOSIS — R001 Bradycardia, unspecified: Secondary | ICD-10-CM | POA: Diagnosis not present

## 2019-02-25 DIAGNOSIS — I25708 Atherosclerosis of coronary artery bypass graft(s), unspecified, with other forms of angina pectoris: Secondary | ICD-10-CM

## 2019-02-25 MED ORDER — ISOSORBIDE MONONITRATE ER 60 MG PO TB24: 120.0000 mg | ORAL_TABLET | Freq: Every day | ORAL | 0 refills | Status: DC

## 2019-02-25 NOTE — Patient Instructions (Addendum)
Medication Instructions:   INCREASE YOUR IMDUR (ISOSORBIDE MONONITRATE) TO 120 MG BY MOUTH DAILY  If you need a refill on your cardiac medications before your next appointment, please call your pharmacy.     Follow-Up:  WITH LAURA INGOLD NP, DAYNA DUNN PA-C OR BRITTANY SIMMONS PA-C IN THE OFFICE IN ONE MONTH--LAURA OR DAYNA HAS SEEN THE PT BEFORE.   *PER LAURA INGOLD NP SHE WOULD LIKE FOR YOU TO CHECK YOUR BP AND RECORD THIS ON PAPER, ON NON-DIALYSIS DAY FOR A WEEK, THEN CALL THE OFFICE TO REPORT YOUR RECORDINGS.   OFFICE NUMBER IS [phone]--YOU MAY ALSO SEND THEM TO [place] THROUGH YOUR ACTIVE MYCHART ACCOUNT.

## 2019-02-26 DIAGNOSIS — N186 End stage renal disease: Secondary | ICD-10-CM | POA: Diagnosis not present

## 2019-02-26 DIAGNOSIS — N2581 Secondary hyperparathyroidism of renal origin: Secondary | ICD-10-CM | POA: Diagnosis not present

## 2019-03-01 DIAGNOSIS — N186 End stage renal disease: Secondary | ICD-10-CM | POA: Diagnosis not present

## 2019-03-01 DIAGNOSIS — N2581 Secondary hyperparathyroidism of renal origin: Secondary | ICD-10-CM | POA: Diagnosis not present

## 2019-03-03 DIAGNOSIS — N186 End stage renal disease: Secondary | ICD-10-CM | POA: Diagnosis not present

## 2019-03-03 DIAGNOSIS — N2581 Secondary hyperparathyroidism of renal origin: Secondary | ICD-10-CM | POA: Diagnosis not present

## 2019-03-04 ENCOUNTER — Telehealth: Payer: Self-pay

## 2019-03-04 MED ORDER — CARVEDILOL 12.5 MG PO TABS: 12.5000 mg | ORAL_TABLET | Freq: Two times a day (BID) | ORAL | 3 refills | Status: DC

## 2019-03-04 NOTE — Telephone Encounter (Signed)
Spoke with LaVerne the pts daughter and she is having EMS take the pt to the ED on [address] since her breathing has been getting worse as the day progresses and the pt is very uncomfortable. She felt best to have her assessed prior to the weekend to help her get some relief.. I advised her that is the right thing to do and I will forward to Dr. [name] for her review.

## 2019-03-04 NOTE — Telephone Encounter (Signed)
Spoke with the pt re: Dr. Francesca Oman recommendation to increase her Carvedilol to 12.5 mg po bid... I advised her to put her old bottle away and I will call her in a new RX since taking 4 tabs bid she agreed would be a lot for her.. she will pick up the new RX and will put away her old RX for the 3.125mg .... she repeated all back to me.. I also LM for her daughter explaining the changes for added support for the pt.

## 2019-03-04 NOTE — Telephone Encounter (Addendum)
LM for both pts daughters LaVerne and Reino Bellis as [name] has sent a My Chart message again about the pts med changes.   Dorothy Spark, MD to Isaiah Serge, NP . Nuala Alpha, LPN    [id number] What about increasing her carvedilol to 12.5 mg PO BID?    New RX sent to the Pharmacy since pt only has the 3.125mg  tabs at home.   Spoke with the pt this a.m.

## 2019-03-05 DIAGNOSIS — I517 Cardiomegaly: Secondary | ICD-10-CM | POA: Diagnosis not present

## 2019-03-05 DIAGNOSIS — R0989 Other specified symptoms and signs involving the circulatory and respiratory systems: Secondary | ICD-10-CM | POA: Diagnosis not present

## 2019-03-05 DIAGNOSIS — R0602 Shortness of breath: Secondary | ICD-10-CM | POA: Diagnosis not present

## 2019-03-05 DIAGNOSIS — N2581 Secondary hyperparathyroidism of renal origin: Secondary | ICD-10-CM | POA: Diagnosis not present

## 2019-03-05 DIAGNOSIS — Z95828 Presence of other vascular implants and grafts: Secondary | ICD-10-CM | POA: Diagnosis not present

## 2019-03-05 DIAGNOSIS — N186 End stage renal disease: Secondary | ICD-10-CM | POA: Diagnosis not present

## 2019-03-05 DIAGNOSIS — J90 Pleural effusion, not elsewhere classified: Secondary | ICD-10-CM | POA: Diagnosis not present

## 2019-03-07 DIAGNOSIS — Z992 Dependence on renal dialysis: Secondary | ICD-10-CM | POA: Diagnosis not present

## 2019-03-07 DIAGNOSIS — N186 End stage renal disease: Secondary | ICD-10-CM | POA: Diagnosis not present

## 2019-03-08 DIAGNOSIS — D509 Iron deficiency anemia, unspecified: Secondary | ICD-10-CM | POA: Diagnosis not present

## 2019-03-08 DIAGNOSIS — N186 End stage renal disease: Secondary | ICD-10-CM | POA: Diagnosis not present

## 2019-03-08 DIAGNOSIS — D631 Anemia in chronic kidney disease: Secondary | ICD-10-CM | POA: Diagnosis not present

## 2019-03-08 DIAGNOSIS — N2581 Secondary hyperparathyroidism of renal origin: Secondary | ICD-10-CM | POA: Diagnosis not present

## 2019-03-09 ENCOUNTER — Telehealth: Payer: Self-pay | Admitting: Cardiology

## 2019-03-09 NOTE — Telephone Encounter (Signed)
° ° °  ° °  [facility] Pre-operative Risk Assessment    Request for surgical clearance:  1. What type of surgery is being performed? Cath removal  2. When is this surgery scheduled? TBD  3. What type of clearance is required (medical clearance vs. Pharmacy clearance to hold med vs. Both)? PHARMACY  4. Are there any medications that need to be held prior to surgery and how long?clopidogrel (PLAVIX) 75 MG tablet  5. Practice name and name of physician performing surgery? [facility]  6. What is your office phone number [phone]   7.   What is your office fax number [phone]  8.   Anesthesia type (None, local, MAC, general) ?    Med[facility] 03/09/2019, 9:02 AM  _________________________________________________________________   (provider comments below)

## 2019-03-09 NOTE — Telephone Encounter (Signed)
   Monica [name] has a history of multiple cardiac stents with recent in-stent restenosis requiring drug-eluting stent placement to the SVG-RI on 01/10/2019.  The patient is currently being treated with dual antiplatelet therapy with clopidogrel and aspirin long-term.  Stopping clopidogrel at this time would put her at very high risk for stent thrombosis.  Recommend not interrupting antiplatelet therapy at this time.  I am not sure what cath removal refers to, perhaps dialysis catheter.  It would be best to try to find a way of conducting the requested procedure without interrupting antiplatelet therapy.  I will E fax this message to the requesting provider and also to Dr. [name], primary cardiologist, for any additional input.  Daune Perch, AGNP-C [facility] 03/09/2019  11:33 AM

## 2019-03-10 DIAGNOSIS — N2581 Secondary hyperparathyroidism of renal origin: Secondary | ICD-10-CM | POA: Diagnosis not present

## 2019-03-10 DIAGNOSIS — D509 Iron deficiency anemia, unspecified: Secondary | ICD-10-CM | POA: Diagnosis not present

## 2019-03-10 DIAGNOSIS — N186 End stage renal disease: Secondary | ICD-10-CM | POA: Diagnosis not present

## 2019-03-10 DIAGNOSIS — D631 Anemia in chronic kidney disease: Secondary | ICD-10-CM | POA: Diagnosis not present

## 2019-03-10 DIAGNOSIS — E119 Type 2 diabetes mellitus without complications: Secondary | ICD-10-CM | POA: Diagnosis not present

## 2019-03-10 DIAGNOSIS — M109 Gout, unspecified: Secondary | ICD-10-CM | POA: Diagnosis not present

## 2019-03-10 NOTE — Telephone Encounter (Signed)
Went and spoke with Cecilie Kicks NP about this pt and several mychart messages between the pts daughter and Mickel Baas.  Per Cecilie Kicks, she would like for the pts daughter to bring her back into the clinic on next Tuesday 9/8 at 2 pm to see Daune Perch NP.  Per Mickel Baas, she feels confident that her coreg will have kicked in by then and we can re-evaluate everything.  Per Cecilie Kicks NP, the determination will be if her heart is causing her issues or if its her CKD, for she is a dialysis pt.  Next Tuesdays visit will evaluate this issue. Endorsed the appt date and time to the pts Daughter Laverne, and she verbalized understanding and agrees with this plan. Daughter will come in with the pt, and note was placed in appt notes for her to be allowed to come in with the pt.

## 2019-03-11 ENCOUNTER — Ambulatory Visit: Payer: Medicare HMO | Admitting: *Deleted

## 2019-03-11 DIAGNOSIS — E782 Mixed hyperlipidemia: Secondary | ICD-10-CM | POA: Diagnosis not present

## 2019-03-11 DIAGNOSIS — I119 Hypertensive heart disease without heart failure: Secondary | ICD-10-CM | POA: Diagnosis not present

## 2019-03-11 DIAGNOSIS — Z992 Dependence on renal dialysis: Secondary | ICD-10-CM | POA: Diagnosis not present

## 2019-03-11 DIAGNOSIS — I2510 Atherosclerotic heart disease of native coronary artery without angina pectoris: Secondary | ICD-10-CM | POA: Diagnosis not present

## 2019-03-11 DIAGNOSIS — N186 End stage renal disease: Secondary | ICD-10-CM | POA: Diagnosis not present

## 2019-03-11 DIAGNOSIS — E1151 Type 2 diabetes mellitus with diabetic peripheral angiopathy without gangrene: Secondary | ICD-10-CM | POA: Diagnosis not present

## 2019-03-11 DIAGNOSIS — M1A49X0 Other secondary chronic gout, multiple sites, without tophus (tophi): Secondary | ICD-10-CM | POA: Diagnosis not present

## 2019-03-12 DIAGNOSIS — D509 Iron deficiency anemia, unspecified: Secondary | ICD-10-CM | POA: Diagnosis not present

## 2019-03-12 DIAGNOSIS — N186 End stage renal disease: Secondary | ICD-10-CM | POA: Diagnosis not present

## 2019-03-12 DIAGNOSIS — N2581 Secondary hyperparathyroidism of renal origin: Secondary | ICD-10-CM | POA: Diagnosis not present

## 2019-03-12 DIAGNOSIS — D631 Anemia in chronic kidney disease: Secondary | ICD-10-CM | POA: Diagnosis not present

## 2019-03-15 ENCOUNTER — Ambulatory Visit (INDEPENDENT_AMBULATORY_CARE_PROVIDER_SITE_OTHER): Payer: Medicare HMO | Admitting: Cardiology

## 2019-03-15 ENCOUNTER — Encounter: Payer: Self-pay | Admitting: Cardiology

## 2019-03-15 ENCOUNTER — Other Ambulatory Visit: Payer: Self-pay

## 2019-03-15 VITALS — BP 120/50 | HR 74 | Ht 62.0 in | Wt 167.0 lb

## 2019-03-15 DIAGNOSIS — D509 Iron deficiency anemia, unspecified: Secondary | ICD-10-CM | POA: Diagnosis not present

## 2019-03-15 DIAGNOSIS — I495 Sick sinus syndrome: Secondary | ICD-10-CM

## 2019-03-15 DIAGNOSIS — D631 Anemia in chronic kidney disease: Secondary | ICD-10-CM | POA: Diagnosis not present

## 2019-03-15 DIAGNOSIS — I10 Essential (primary) hypertension: Secondary | ICD-10-CM

## 2019-03-15 DIAGNOSIS — R0602 Shortness of breath: Secondary | ICD-10-CM

## 2019-03-15 DIAGNOSIS — N2581 Secondary hyperparathyroidism of renal origin: Secondary | ICD-10-CM | POA: Diagnosis not present

## 2019-03-15 DIAGNOSIS — I5042 Chronic combined systolic (congestive) and diastolic (congestive) heart failure: Secondary | ICD-10-CM

## 2019-03-15 DIAGNOSIS — I25708 Atherosclerosis of coronary artery bypass graft(s), unspecified, with other forms of angina pectoris: Secondary | ICD-10-CM | POA: Diagnosis not present

## 2019-03-15 DIAGNOSIS — N186 End stage renal disease: Secondary | ICD-10-CM | POA: Diagnosis not present

## 2019-03-15 NOTE — Progress Notes (Addendum)
Cardiology Office Note:    Date:  03/15/2019   ID:  Monica [name], DOB Oct 16, 1930, MRN [id number]  PCP:  Benito Mccreedy, MD  Cardiologist:  Ena Dawley, MD  Referring MD: Benito Mccreedy, MD   Chief Complaint  Patient presents with   Follow-up   Hypertension   Shortness of Breath    History of Present Illness:    Monica [name] is a 85 y.o. female with a past medical history significant for CAD (s/p CABG 2017, NSTEMI 01/2019 with PCI), chronic combined CHF, moderate mitral regurgitation, anemia, bradycardia/sinus node dysfunction, ESRD on HD TThS, GERD, hyperlipidemia and diabetes type 2.  The patient was admitted to the hospital in early July for NSTEMI([id number]) 01/07/2019 with cath showing occluded/atretic SVG to the distal RCA, severe proximal to stent and in-stent restenosis of SVG to OM with successful PTCA/with overlapping DES stent and successful DES to the ostial proximal SVG to the RI.Echo showed new reduced LVEF 30 to 35% with moderately dilated right and left atrium moderate MR and mild AI. She was placed on ASA, Plaix and metoprolol. She was readmitted in mid-July with fatigue and dizziness with junctional bradycardia HR in the 40s, therefore metoprolol was discontinued. She was then again readmitted 02/10/19 with chest pain and elevated troponin (peak 264). Nuclear stress test was abnormal, prompting repeat cath with findings above - felt to be stable disease, no focal targets for PCI. Imdur was titrated with consideration for addition of Ranexa although it does not appear this medicine has been studied in dialysis patients.She wasre-tri[place]on low dose carvedilolfor antianginal effect.She is not on ACEI/ARB/ARNI/spiro due to renal dysfunction. Last labs 02/2019 showed Hgb 10.6, LDL 63, K 4.5, Cr 4.61, TSH wnl.  At office visit 02/18/2019 the patient reported having felt well after discharge but during a dialysis session the prior day she had an episode of chest pain  and EMS was called.  Blood pressure was reportedly 198 from dialysis notes.  The patient took 2 sublingual nitroglycerin with relief and declined to be transported to the hospital.  She went home and rested and felt better.  Blood pressure remained above goal.  Imdur and hydralazine were increased.  It was noted that at some point amlodipine was stopped, unclear reason.  The patient was recently seen in the office on 02/25/2019 by Cecilie Kicks, NP for follow-up.  It was noted that her blood pressure was improved at times.  She was still having some chest pain with dialysis at the end due to elevated blood pressure.  It was noted that the patient was not taking any medications prior to dialysis so her blood pressure would run high during sessions.  Nephrology had decreased how much fluid they were removing and the patient stated that she felt a lot better.  Blood pressure was still elevated at this office visit so Imdur was increased to 120 mg daily.  Carvedilol was increased to 12.5 mg BID.   The patient returns today for close follow-up of blood pressure and chest pain with her daughter. She is unable to take her BP meds prior to dialysis due to dropping BP with dialysis. She is taking her hydralazine right after dialysis. Her BP has been up and down but daughter reports that her BP is a little more regulated.   She is having spells of shortness of breath, getting progressively worse. Her chest feels heavy with the shortness of breath but no chest pain like prior to her stents. She had to have oxygen at  dialysis this morning. She has tried using her inhaler and her daughter says that she uses it over and over in trying to catch her breath. The patient denies any wheezing. This usually occurs on the mornings prior to dialysis. If she rushes it is worse. She tries to pace herself. On Saturday she had an episode after dialysis. Around 8/29 they did a CXR after dialysis to ensure that they were taking enough fluid  off and her daughter says they told her that they were.  Her PCP has told her that the shortness of breath is not due to asthma.  Her daughter has a pulse oximeter and says that her pulse ox drops from the upper 90s to the lower 90s when she is having difficulty breathing, but does not go below 91%.  No longer having palpitations as she was having previously.   CXR at [facility] on 8/29/20220: IMPRESSION: 1. Cardiomegaly and mild vascular congestion without overt pulmonary edema. 2. Small right pleural effusion. 3. Well-positioned right IJ tunneled hemodialysis catheter.   She is planned to have IJ catheter removed on Friday. Since she cannot be off Plavix, this will be done in [facility] by [facility].    Vital signs from home per mychart message on non-dialysis days: Date/Time: 03/14/2019 9:22am BP 178/82 HR 80 Oxygen 91   Repeated: Date/Time: 03/14/2019 1:12pm BP 159/76 HR 66 Oxygen 98   Past Medical History:  Diagnosis Date   Anemia    Asthma    Chronic combined systolic and diastolic CHF (congestive heart failure) ([place])    a. Echo 11/19 [facility]):  EF 55-65, normal wall motion, normal diastolic function. b. Echo 01/2019 EF 30-35%, moderate MR, mild AI.   Coronary artery disease    a. s/p CABG 2017. b.s/p NSTEMI 11/19 [facility]) >> S-PDA 100 >>PCI: 2.25 x 24 mm Synergy DES to S-OM. c. NSTEMI 01/2019 - occluded SVG-distal RCA, severe proximal to stent/ISR of SVG-OM s/p overlapping DES, DES to ostial prox SVG to the RI.    ESRD (end stage renal disease)    Dialysis Tu, Th, Sa   Essential hypertension    Gastroesophageal reflux disease    History of blood transfusion    Hyperlipidemia    Junctional bradycardia    Moderate mitral regurgitation    Peptic ulcer    Protein calorie malnutrition (HCC)    Sinus node dysfunction (HCC)    Type 2 diabetes mellitus ([place])     Past Surgical History:  Procedure Laterality Date   ABDOMINAL  HYSTERECTOMY     CARDIAC CATHETERIZATION N/A 09/17/2015   Procedure: Left Heart Cath and Coronary Angiography;  Surgeon: Jettie Booze, MD;  Location: [place] [facility];  Service: Cardiovascular;  Laterality: N/A;   COLON SURGERY     CORONARY ARTERY BYPASS GRAFT N/A 09/19/2015   Procedure: CORONARY ARTERY BYPASS GRAFTING (CABG) TIMES FOUR USING BILATARAL SAPHENOUS VEIN GRAFTS AND LEFT INTERNAL MAMMARY ARTERY;  Surgeon: [name], MD;  Location: [place];  Service: Open Heart Surgery;  Laterality: N/A;   CORONARY STENT INTERVENTION N/A 01/10/2019   Procedure: CORONARY STENT INTERVENTION;  Surgeon: Leonie Man, MD;  Location: [place] [facility];  Service: Cardiovascular;  Laterality: N/A;   LEFT HEART CATH AND CORS/GRAFTS ANGIOGRAPHY N/A 01/10/2019   Procedure: LEFT HEART CATH AND CORS/GRAFTS ANGIOGRAPHY;  Surgeon: Leonie Man, MD;  Location: [place] [facility];  Service: Cardiovascular;  Laterality: N/A;   LEFT HEART CATH AND  CORS/GRAFTS ANGIOGRAPHY N/A 02/11/2019   Procedure: LEFT HEART CATH AND CORS/GRAFTS ANGIOGRAPHY;  Surgeon: Burnell Blanks, MD;  Location: [place] [facility];  Service: Cardiovascular;  Laterality: N/A;   TEE WITHOUT CARDIOVERSION N/A 09/19/2015   Procedure: TRANSESOPHAGEAL ECHOCARDIOGRAM (TEE);  Surgeon: [name], MD;  Location: [place];  Service: Open Heart Surgery;  Laterality: N/A;    Current Medications: Current Meds  Medication Sig   acetaminophen (TYLENOL) 500 MG tablet Take 1 tablet (500 mg total) by mouth every 6 (six) hours as needed for mild pain or headache.   albuterol (PROVENTIL HFA;VENTOLIN HFA) 108 (90 Base) MCG/ACT inhaler Inhale 2 puffs into the lungs every 6 (six) hours as needed for wheezing or shortness of breath.   allopurinol (ZYLOPRIM) 100 MG tablet Take 100 mg by mouth 2 (two) times daily.    aspirin EC 81 MG EC tablet Take 1 tablet (81 mg total) by mouth daily.   atorvastatin (LIPITOR) 80 MG tablet Take 80  mg by mouth daily at 6 PM.    brimonidine (ALPHAGAN P) 0.1 % SOLN Place 1 drop into both eyes 2 (two) times daily.   carvedilol (COREG) 12.5 MG tablet Take 1 tablet (12.5 mg total) by mouth 2 (two) times daily.   Cholecalciferol (VITAMIN D-3) 1000 units CAPS Take 1,000 Units by mouth daily.    clopidogrel (PLAVIX) 75 MG tablet Take 75 mg by mouth daily.    diphenhydrAMINE (BENADRYL) 25 MG tablet Take 25 mg by mouth as needed for itching or allergies.    ferric citrate (AURYXIA) 1 GM 210 MG(Fe) tablet Take 210 mg by mouth 3 (three) times daily with meals.    hydrALAZINE (APRESOLINE) 25 MG tablet Take 2 tablets (50 mg total) by mouth 3 (three) times daily.   isosorbide mononitrate (IMDUR) 60 MG 24 hr tablet Take 2 tablets (120 mg total) by mouth daily.   latanoprost (XALATAN) 0.005 % ophthalmic solution Place 1 drop into both eyes at bedtime.   loratadine (CLARITIN) 10 MG tablet Take 10 mg by mouth as needed for allergies.    Melatonin 5 MG CAPS Take 5 mg by mouth at bedtime.    multivitamin (RENA-VIT) TABS tablet Take 1 tablet by mouth daily.   nitroGLYCERIN (NITROSTAT) 0.4 MG SL tablet Place 0.4 mg under the tongue every 5 (five) minutes as needed for chest pain.   pantoprazole (PROTONIX) 20 MG tablet Take 20 mg by mouth daily.     Allergies:   Patient has no known allergies.   Social History   Socioeconomic History   Marital status: Widowed    Spouse name: Not on file   Number of children: Not on file   Years of education: Not on file   Highest education level: Not on file  Occupational History   Occupation: Retired  Scientist, product/process development strain: Not hard at [employer]    Worry: Never true    Inability: Never true   Transportation needs    Medical: No    Non-medical: No  Tobacco Use   Smoking status: Never Smoker   Smokeless tobacco: Former Systems developer    Types: Snuff  Substance and Sexual Activity   Alcohol use: No    Alcohol/week:  0.0 standard drinks   Drug use: No   Sexual activity: Not on file  Lifestyle   Physical activity    Days per week: Not on file    Minutes per session: Not on file  Stress: Not on file  Relationships   Social connections    Talks on phone: More than three times a week    Gets together: More than three times a week    Attends religious service: More than 4 times per year    Active member of club or organization: No    Attends meetings of clubs or organizations: Never    Relationship status: Widowed  Other Topics Concern   Not on file  Social History Narrative   Not on file     Family History: The patient's family history includes Heart attack in her father. ROS:   Please see the history of present illness.     All other systems reviewed and are negative.  EKGs/Labs/Other Studies Reviewed:    The following studies were reviewed today:  Cardiac cath 02/11/19  Dist LM to Prox LAD lesion is 30% stenosed.  Mid LAD-1 lesion is 95% stenosed.  Mid LAD-2 lesion is 100% stenosed.  Ost Cx to Mid Cx lesion is 100% stenosed.  Prox RCA lesion is 70% stenosed.  Prox RCA to Mid RCA lesion is 90% stenosed.  Mid RCA lesion is 95% stenosed.  Dist RCA lesion is 100% stenosed with 100% stenosed side branch in RPAV.  Ramus lesion is 100% stenosed.  LIMA and is normal in caliber.  There is no competitive flow  SVG.  The graft exhibits severe diffuse disease.  Origin to Prox Graft lesion is 90% stenosed.  Prox Graft to Mid Graft lesion is 80% stenosed.  Dist Graft lesion is 100% stenosed.  SVG.  SVG.  Previously placed Origin to Prox Graft stent (unknown type) is widely patent.  Previously placed Dist Graft stent (unknown type) is widely patent.  1. Chronic occlusion of the LAD is known. The LIMA to the LAD is patent 2. Chronic occlusion of the Circumflex is known. The SVG to the ramus intermediate is patent with patent proximal stent.  The SVG to the OM is  patent with patent distal body stent.  3. Chronic occlusion mid RCA. The SVG to the RCA is known to be occluded (not selectively engaged today).   Recommendations: No focal targets for PCI. Continue medical management. Consider addition of Ranexa.  Diagnostic Dominance: Right   Cardiac cath [id number]  LV end diastolic pressure is normal.  Dist LM to Prox LAD lesion is 30% stenosed.  Mid LAD-1 lesion is 95% stenosed. Mid LAD-2 lesion is 100% stenosed.  Ramus lesion is 100% stenosed.  Ost Cx to Mid Cx lesion is 100% stenosed.  Prox RCA lesion is 70% stenosed. Prox RCA to Mid RCA lesion is 90% stenosed. Mid RCA lesion is 95% stenosed. Dist RCA lesion is 100% stenosed with 100% stenosed side branch in RPAV.  -------------GRAFTS--------------  LIMA-LAD graft was visualized by angiography and is normal in caliber. There is no competitive flow  SVG-dRCA graft was visualized by angiography. The graft exhibits severe diffuse disease. Origin to Prox Graft lesion is 90% stenosed. Prox Graft to Mid Graft lesion is 80% stenosed follwed by Dist Graft lesion is 100% stenosed.  -----------INTERVENTION----------------  SVG-OM graft was visualized by angiography.  Mid Graft lesion is 75% stenosed.  A drug-eluting stent was successfully placed using a STENT SYNERGY DES 2.5X12. Postdilated 2.8 mm  ---------------------------  Mid Graft to Dist Graft Stent is 85% stenosed. Balloon angioplasty was performed using a BALLOON [place].  Post intervention, there is a 0% residual stenosis -in both the new stent, stent overlap in  the previous stent.  SVG-RI graft was visualized by angiography. Origin to Prox Graft lesion is 85% stenosed.  A drug-eluting stent was successfully placed using a STENT SYNERGY DES 2.5X20. -Postdilated to 2.7 mm  Post intervention, there is a 0% residual stenosis.  SUMMARY  Severe native vessel CAD: 100% ostial Circumflex (LCx) and ostial Ramus  Intermedius, diffuse calcified proximal LAD followed by 95% stenosis just after 1st Diag followed by 100% -all CTO; severe diffuse proximal to mid RCA disease with CTO of the distal RCA  Essentially occluded/atretic SVG-dRCA  Severe proximal to stent and in-stent restenosis in SVG-OM ? Successful PTCA followed by DES PCI with overlapping DES stent (Synergy 2.5 mm x 12 mm - 2.8 mm)  Severe ostial-proximal SVG-RI ? Successful DES PCI (Synergy 2.5 mm x 20 mm--2.7 mm)  Normal LVEDP despite significant systemic hypertension   RECOMMENDATIONS  Transfer to cardiology nursing unit for post PCI care after sheath removal in PACU holding area  Aggressive restricted medication with blood pressure management  Dialysis as indicated  We will need to treat occluded RCA system medically  Echo 01/08/2019 IMPRESSIONS   1. The left ventricle has moderate-severely reduced systolic function, with an ejection fraction of 30-35%. The cavity size was normal. There is moderate concentric left ventricular hypertrophy. Left ventricular diastolic Doppler parameters are  consistent with pseudonormalization. Elevated left atrial and left ventricular end-diastolic pressures. 2. The right ventricle has moderately reduced systolic function. The cavity was normal. There is no increase in right ventricular wall thickness. Right ventricular systolic pressure is normal. 3. Left atrial size was moderately dilated. 4. Right atrial size was mildly dilated. 5. The mitral valve is degenerative. Moderate thickening of the mitral valve leaflet. There is mild mitral annular calcification present. Mitral valve regurgitation is moderate by color flow Doppler. 6. The aortic valve is tricuspid. Mild thickening of the aortic valve. Aortic valve regurgitation is mild by color flow Doppler.  EKG:  EKG is  ordered today.  The ekg ordered today demonstrates sinus rhythm with sinus arrhythmia, T wave abnormalities V3-V6, 74  bpm.  QTc 492  Recent Labs: 01/17/2019: B Natriuretic Peptide 1,643.2 02/10/2019: ALT 24; TSH 2.019 02/12/2019: BUN 40; Creatinine, Ser 4.61; Hemoglobin 10.4; Platelets 187; Potassium 4.5; Sodium 137   Recent Lipid Panel    Component Value Date/Time   CHOL 122 02/11/2019 0536   CHOL 125 09/08/2018 0737   TRIG 43 02/11/2019 0536   HDL 50 02/11/2019 0536   HDL 50 09/08/2018 0737   CHOLHDL 2.4 02/11/2019 0536   VLDL 9 02/11/2019 0536   LDLCALC 63 02/11/2019 0536   LDLCALC 53 09/08/2018 0737    Physical Exam:    VS:  BP (!) 120/50    Pulse 74    Ht 5\' 2"  (1.575 m)    Wt 167 lb (75.8 kg)    SpO2 97%    BMI 30.54 kg/m     Wt Readings from Last 3 Encounters:  03/15/19 167 lb (75.8 kg)  02/25/19 167 lb (75.8 kg)  02/18/19 164 lb 12.8 oz (74.8 kg)     Physical Exam  Constitutional: She is oriented to person, place, and time. She appears well-developed and well-nourished. No distress.  Elderly female.  Has right chest permacath.  HENT:  Head: Normocephalic and atraumatic.  Neck: Normal range of motion. Neck supple. No JVD present.  Cardiovascular: Normal rate, regular rhythm, normal heart sounds and intact distal pulses. Exam reveals no gallop and no friction rub.  No murmur  heard. Pulmonary/Chest: Effort normal and breath sounds normal. No respiratory distress. She has no wheezes. She has no rales.  Abdominal: Soft. Bowel sounds are normal.  Musculoskeletal: Normal range of motion.        General: No edema.  Neurological: She is alert and oriented to person, place, and time.  Skin: Skin is warm and dry.  Psychiatric: She has a normal mood and affect. Her behavior is normal. Judgment and thought content normal.  Vitals reviewed.    ASSESSMENT:    1. Essential (primary) hypertension   2. Shortness of breath   3. Chronic combined systolic and diastolic CHF (congestive heart failure) ([place])   4. Coronary artery disease of bypass graft of native heart with stable angina pectoris  ([place])   5. ESRD (end stage renal disease) ([place])   6. Sinus node dysfunction (HCC)    PLAN:    In order of problems listed above:  Hypertension -BP is somewhat better on increased Imdur and Carvedilol per blood pressures at dialysis and at home per the patient's daughter. -Today blood pressure is very well controlled.  Shortness of breath, combined CHF -Pt had DES on 02/11/2019. Echo showed decreased LV function with EF 30-35% -Volume status managed per nephrology with hemodialysis. -Patient does not appear significantly volume overloaded today on exam. -The patient has having progressively worsening episodes of shortness of breath, not responsive to bronchodilator.  Occurs mostly in the morning and possibly related to increased volume just prior to her next dialysis sessions but she has also had an episode of shortness of breath after a dialysis session.  Nephrology has investigated and feels that they are removing enough fluid based on her chest x-ray and ability to get to patient's dry weight with each dialysis session. -I discussed this difficult situation with the DOD, Dr. Radford Pax and we feel that the patient may need further expert evaluation, possible right and left heart cath.  We will refer her to the advanced heart failure clinic to weigh in on that decision.  The daughter states that she really does not want the patient to have another cath as she has had 2 recent cardiac caths. -We will also refer to pulmonology.  CAD with angina in both native and grafts -Recent hx 01/10/19 PTCA followed by DES PCI with overlapping DES stent (Synergy 2.5 mm x 12 mm - 2.8 mm) and DES PCI (Synergy 2.5 mm x 20 mm--2.7 mm) and recath 02/11/2019 with stable anatomy with no further interventions.  The 02/11/2019 cath was done for angina.  Occluded RCA is treated medically.   -Patient is having some episodes of shortness of breath with chest tightness but no chest pressure like she had prior to her recent  stents.  ESRD on HD -Per nephrology, they have been able to get to her dry weight regularly.  Chest x-ray 2 weeks ago led them to believe that enough volume was being removed with each session.  Sinus node dysfunction -Cardiac monitor in 09/2018 showed normal heart rhythm with some brief tachycardia.  No atrial fibrillation. -Patient has just completed another cardiac monitor session.  Report is not resulted yet.   Medication Adjustments/Labs and Tests Ordered: Current medicines are reviewed at length with the patient today.  Concerns regarding medicines are outlined above. Labs and tests ordered and medication changes are outlined in the patient instructions below:  Patient Instructions  Medication Instructions:  Your physician recommends that you continue on your current medications as directed. Please refer to the Current  Medication list given to you today.  If you need a refill on your cardiac medications before your next appointment, please call your pharmacy.   Lab work: None   If you have labs (blood work) drawn today and your tests are completely normal, you will receive your results only by:  [place] (if you have MyChart) OR  A paper copy in the mail If you have any lab test that is abnormal or we need to change your treatment, we will call you to review the results.  Testing/Procedures: None   Follow-Up: You have been referred to the Heart Failure clinic   You have been referred to pulmonology (Dr. Verdie Mosher)  Any Other Special Instructions Will Be Listed Below (If Applicable).       Signed, Daune Perch, NP  03/15/2019 9:46 PM    [facility]

## 2019-03-15 NOTE — Telephone Encounter (Signed)
Replied to the pts daughter that we will plan to see the pt today in clinic at 2 pm with Daune Perch NP.  Informed the pts daughter that I have forward this helpful information with readings attached, to [place] to review at the pts appt today.

## 2019-03-15 NOTE — Patient Instructions (Addendum)
Medication Instructions:  Your physician recommends that you continue on your current medications as directed. Please refer to the Current Medication list given to you today.  If you need a refill on your cardiac medications before your next appointment, please call your pharmacy.   Lab work: None   If you have labs (blood work) drawn today and your tests are completely normal, you will receive your results only by: [place] [facility] MyChart Message (if you have MyChart) OR . A paper copy in the mail If you have any lab test that is abnormal or we need to change your treatment, we will call you to review the results.  Testing/Procedures: None   Follow-Up: You have been referred to the Heart Failure clinic   You have been referred to pulmonology (Dr. Verdie Mosher)  Any Other Special Instructions Will Be Listed Below (If Applicable).

## 2019-03-17 ENCOUNTER — Inpatient Hospital Stay (HOSPITAL_COMMUNITY)
Admission: EM | Admit: 2019-03-17 | Discharge: 2019-03-19 | DRG: 291 | Disposition: A | Discharge status: Home / Self Care | Payer: Medicare HMO | Attending: Internal Medicine | Admitting: Internal Medicine

## 2019-03-17 ENCOUNTER — Encounter (HOSPITAL_COMMUNITY): Payer: Self-pay

## 2019-03-17 ENCOUNTER — Emergency Department (HOSPITAL_COMMUNITY): Payer: Medicare HMO

## 2019-03-17 ENCOUNTER — Other Ambulatory Visit: Payer: Self-pay

## 2019-03-17 DIAGNOSIS — E869 Volume depletion, unspecified: Secondary | ICD-10-CM | POA: Diagnosis present

## 2019-03-17 DIAGNOSIS — K219 Gastro-esophageal reflux disease without esophagitis: Secondary | ICD-10-CM | POA: Diagnosis present

## 2019-03-17 DIAGNOSIS — Z87891 Personal history of nicotine dependence: Secondary | ICD-10-CM | POA: Diagnosis not present

## 2019-03-17 DIAGNOSIS — I132 Hypertensive heart and chronic kidney disease with heart failure and with stage 5 chronic kidney disease, or end stage renal disease: Secondary | ICD-10-CM | POA: Diagnosis present

## 2019-03-17 DIAGNOSIS — Z8249 Family history of ischemic heart disease and other diseases of the circulatory system: Secondary | ICD-10-CM | POA: Diagnosis not present

## 2019-03-17 DIAGNOSIS — N183 Chronic kidney disease, stage 3 (moderate): Secondary | ICD-10-CM | POA: Diagnosis not present

## 2019-03-17 DIAGNOSIS — I259 Chronic ischemic heart disease, unspecified: Secondary | ICD-10-CM | POA: Diagnosis not present

## 2019-03-17 DIAGNOSIS — I340 Nonrheumatic mitral (valve) insufficiency: Secondary | ICD-10-CM | POA: Diagnosis present

## 2019-03-17 DIAGNOSIS — I5043 Acute on chronic combined systolic (congestive) and diastolic (congestive) heart failure: Secondary | ICD-10-CM | POA: Diagnosis present

## 2019-03-17 DIAGNOSIS — N186 End stage renal disease: Secondary | ICD-10-CM | POA: Diagnosis present

## 2019-03-17 DIAGNOSIS — Z23 Encounter for immunization: Secondary | ICD-10-CM

## 2019-03-17 DIAGNOSIS — D638 Anemia in other chronic diseases classified elsewhere: Secondary | ICD-10-CM | POA: Diagnosis present

## 2019-03-17 DIAGNOSIS — Z992 Dependence on renal dialysis: Secondary | ICD-10-CM | POA: Diagnosis not present

## 2019-03-17 DIAGNOSIS — I5082 Biventricular heart failure: Secondary | ICD-10-CM | POA: Diagnosis present

## 2019-03-17 DIAGNOSIS — I120 Hypertensive chronic kidney disease with stage 5 chronic kidney disease or end stage renal disease: Secondary | ICD-10-CM | POA: Diagnosis not present

## 2019-03-17 DIAGNOSIS — R0789 Other chest pain: Secondary | ICD-10-CM | POA: Diagnosis not present

## 2019-03-17 DIAGNOSIS — I200 Unstable angina: Secondary | ICD-10-CM | POA: Diagnosis not present

## 2019-03-17 DIAGNOSIS — E11649 Type 2 diabetes mellitus with hypoglycemia without coma: Secondary | ICD-10-CM | POA: Diagnosis present

## 2019-03-17 DIAGNOSIS — I10 Essential (primary) hypertension: Secondary | ICD-10-CM | POA: Diagnosis not present

## 2019-03-17 DIAGNOSIS — E785 Hyperlipidemia, unspecified: Secondary | ICD-10-CM | POA: Diagnosis present

## 2019-03-17 DIAGNOSIS — I209 Angina pectoris, unspecified: Secondary | ICD-10-CM | POA: Diagnosis present

## 2019-03-17 DIAGNOSIS — Z20828 Contact with and (suspected) exposure to other viral communicable diseases: Secondary | ICD-10-CM | POA: Diagnosis present

## 2019-03-17 DIAGNOSIS — I2584 Coronary atherosclerosis due to calcified coronary lesion: Secondary | ICD-10-CM | POA: Diagnosis not present

## 2019-03-17 DIAGNOSIS — I2510 Atherosclerotic heart disease of native coronary artery without angina pectoris: Secondary | ICD-10-CM | POA: Diagnosis not present

## 2019-03-17 DIAGNOSIS — Z79899 Other long term (current) drug therapy: Secondary | ICD-10-CM

## 2019-03-17 DIAGNOSIS — D509 Iron deficiency anemia, unspecified: Secondary | ICD-10-CM | POA: Diagnosis not present

## 2019-03-17 DIAGNOSIS — Z794 Long term (current) use of insulin: Secondary | ICD-10-CM | POA: Diagnosis not present

## 2019-03-17 DIAGNOSIS — R0602 Shortness of breath: Secondary | ICD-10-CM | POA: Diagnosis not present

## 2019-03-17 DIAGNOSIS — Z90710 Acquired absence of both cervix and uterus: Secondary | ICD-10-CM

## 2019-03-17 DIAGNOSIS — I252 Old myocardial infarction: Secondary | ICD-10-CM

## 2019-03-17 DIAGNOSIS — I5042 Chronic combined systolic (congestive) and diastolic (congestive) heart failure: Secondary | ICD-10-CM

## 2019-03-17 DIAGNOSIS — E1122 Type 2 diabetes mellitus with diabetic chronic kidney disease: Secondary | ICD-10-CM | POA: Diagnosis present

## 2019-03-17 DIAGNOSIS — R079 Chest pain, unspecified: Secondary | ICD-10-CM | POA: Diagnosis present

## 2019-03-17 DIAGNOSIS — R300 Dysuria: Secondary | ICD-10-CM | POA: Diagnosis present

## 2019-03-17 DIAGNOSIS — J45901 Unspecified asthma with (acute) exacerbation: Secondary | ICD-10-CM | POA: Diagnosis present

## 2019-03-17 DIAGNOSIS — N2581 Secondary hyperparathyroidism of renal origin: Secondary | ICD-10-CM | POA: Diagnosis present

## 2019-03-17 DIAGNOSIS — I25119 Atherosclerotic heart disease of native coronary artery with unspecified angina pectoris: Secondary | ICD-10-CM | POA: Diagnosis present

## 2019-03-17 DIAGNOSIS — M40204 Unspecified kyphosis, thoracic region: Secondary | ICD-10-CM | POA: Diagnosis present

## 2019-03-17 DIAGNOSIS — I509 Heart failure, unspecified: Secondary | ICD-10-CM | POA: Diagnosis present

## 2019-03-17 DIAGNOSIS — J45909 Unspecified asthma, uncomplicated: Secondary | ICD-10-CM | POA: Diagnosis not present

## 2019-03-17 DIAGNOSIS — R0902 Hypoxemia: Secondary | ICD-10-CM | POA: Diagnosis not present

## 2019-03-17 DIAGNOSIS — R0600 Dyspnea, unspecified: Secondary | ICD-10-CM | POA: Diagnosis present

## 2019-03-17 DIAGNOSIS — Z7982 Long term (current) use of aspirin: Secondary | ICD-10-CM

## 2019-03-17 DIAGNOSIS — Z7902 Long term (current) use of antithrombotics/antiplatelets: Secondary | ICD-10-CM

## 2019-03-17 DIAGNOSIS — D631 Anemia in chronic kidney disease: Secondary | ICD-10-CM | POA: Diagnosis not present

## 2019-03-17 DIAGNOSIS — R9431 Abnormal electrocardiogram [ECG] [EKG]: Secondary | ICD-10-CM | POA: Diagnosis present

## 2019-03-17 DIAGNOSIS — E1129 Type 2 diabetes mellitus with other diabetic kidney complication: Secondary | ICD-10-CM | POA: Diagnosis present

## 2019-03-17 LAB — CBC WITH DIFFERENTIAL/PLATELET
Abs Immature Granulocytes: 0.03 10*3/uL (ref 0.00–0.07)
Basophils Absolute: 0 10*3/uL (ref 0.0–0.1)
Basophils Relative: 1 %
Eosinophils Absolute: 0.1 10*3/uL (ref 0.0–0.5)
Eosinophils Relative: 1 %
HCT: 29.4 % — ABNORMAL LOW (ref 36.0–46.0)
Hemoglobin: 9.2 g/dL — ABNORMAL LOW (ref 12.0–15.0)
Immature Granulocytes: 0 %
Lymphocytes Relative: 19 %
Lymphs Abs: 1.6 10*3/uL (ref 0.7–4.0)
MCH: 29.9 pg (ref 26.0–34.0)
MCHC: 31.3 g/dL (ref 30.0–36.0)
MCV: 95.5 fL (ref 80.0–100.0)
Monocytes Absolute: 0.5 10*3/uL (ref 0.1–1.0)
Monocytes Relative: 6 %
Neutro Abs: 6.2 10*3/uL (ref 1.7–7.7)
Neutrophils Relative %: 73 %
Platelets: 158 10*3/uL (ref 150–400)
RBC: 3.08 MIL/uL — ABNORMAL LOW (ref 3.87–5.11)
RDW: 15.4 % (ref 11.5–15.5)
WBC: 8.4 10*3/uL (ref 4.0–10.5)
nRBC: 0 % (ref 0.0–0.2)

## 2019-03-17 LAB — BASIC METABOLIC PANEL
Anion gap: 14 (ref 5–15)
BUN: 19 mg/dL (ref 8–23)
CO2: 23 mmol/L (ref 22–32)
Calcium: 8.4 mg/dL — ABNORMAL LOW (ref 8.9–10.3)
Chloride: 101 mmol/L (ref 98–111)
Creatinine, Ser: 2.98 mg/dL — ABNORMAL HIGH (ref 0.44–1.00)
GFR calc Af Amer: 16 mL/min — ABNORMAL LOW (ref >60)
GFR calc non Af Amer: 14 mL/min — ABNORMAL LOW (ref >60)
Glucose, Bld: 224 mg/dL — ABNORMAL HIGH (ref 70–99)
Potassium: 4.3 mmol/L (ref 3.5–5.1)
Sodium: 138 mmol/L (ref 135–145)

## 2019-03-17 LAB — TROPONIN I (HIGH SENSITIVITY)
Troponin I (High Sensitivity): 27 ng/L — ABNORMAL HIGH (ref <18)
Troponin I (High Sensitivity): 51 ng/L — ABNORMAL HIGH (ref <18)

## 2019-03-17 LAB — SARS CORONAVIRUS 2 BY RT PCR (HOSPITAL ORDER, PERFORMED IN ~~LOC~~ HOSPITAL LAB): SARS Coronavirus 2: NEGATIVE

## 2019-03-17 LAB — GLUCOSE, CAPILLARY: Glucose-Capillary: 176 mg/dL — ABNORMAL HIGH (ref 70–99)

## 2019-03-17 LAB — BRAIN NATRIURETIC PEPTIDE: B Natriuretic Peptide: 2492.2 pg/mL — ABNORMAL HIGH (ref 0.0–100.0)

## 2019-03-17 MED ORDER — BRIMONIDINE TARTRATE 0.15 % OP SOLN
1.0000 [drp] | Freq: Two times a day (BID) | OPHTHALMIC | Status: DC
  Administered 2019-03-17 – 2019-03-18 (×2): 1 [drp] via OPHTHALMIC
  Filled 2019-03-17: qty 5

## 2019-03-17 MED ORDER — FERRIC CITRATE 1 GM 210 MG(FE) PO TABS
210.0000 mg | ORAL_TABLET | Freq: Three times a day (TID) | ORAL | Status: DC
  Administered 2019-03-18 – 2019-03-19 (×2): 210 mg via ORAL
  Filled 2019-03-17 (×7): qty 1

## 2019-03-17 MED ORDER — ISOSORBIDE MONONITRATE ER 60 MG PO TB24
120.0000 mg | ORAL_TABLET | Freq: Every day | ORAL | Status: DC
  Administered 2019-03-17 – 2019-03-18 (×2): 120 mg via ORAL
  Administered 2019-03-19: 07:00:00 60 mg via ORAL
  Filled 2019-03-17 (×2): qty 2
  Filled 2019-03-17: qty 4

## 2019-03-17 MED ORDER — MELATONIN 5 MG PO TABS
5.0000 mg | ORAL_TABLET | Freq: Every day | ORAL | Status: DC
  Filled 2019-03-17: qty 1

## 2019-03-17 MED ORDER — DIPHENHYDRAMINE HCL 25 MG PO CAPS: 25.0000 mg | ORAL_CAPSULE | Freq: Every day | ORAL | Status: DC | PRN

## 2019-03-17 MED ORDER — ACETAMINOPHEN 325 MG PO TABS: 650.0000 mg | ORAL_TABLET | Freq: Four times a day (QID) | ORAL | Status: DC | PRN

## 2019-03-17 MED ORDER — ALLOPURINOL 100 MG PO TABS
100.0000 mg | ORAL_TABLET | Freq: Two times a day (BID) | ORAL | Status: DC
  Administered 2019-03-17 – 2019-03-19 (×3): 100 mg via ORAL
  Filled 2019-03-17 (×3): qty 1

## 2019-03-17 MED ORDER — INSULIN ASPART 100 UNIT/ML ~~LOC~~ SOLN
0.0000 [IU] | Freq: Three times a day (TID) | SUBCUTANEOUS | Status: DC
  Administered 2019-03-18: 12:00:00 3 [IU] via SUBCUTANEOUS
  Administered 2019-03-18: 2 [IU] via SUBCUTANEOUS

## 2019-03-17 MED ORDER — MELATONIN 3 MG PO TABS
6.0000 mg | ORAL_TABLET | Freq: Every day | ORAL | Status: DC
  Administered 2019-03-17 – 2019-03-18 (×2): 6 mg via ORAL
  Filled 2019-03-17 (×3): qty 2

## 2019-03-17 MED ORDER — LATANOPROST 0.005 % OP SOLN
1.0000 [drp] | Freq: Every day | OPHTHALMIC | Status: DC
  Administered 2019-03-17 – 2019-03-18 (×2): 1 [drp] via OPHTHALMIC
  Filled 2019-03-17: qty 2.5

## 2019-03-17 MED ORDER — RENA-VITE PO TABS
1.0000 | ORAL_TABLET | Freq: Every day | ORAL | Status: DC
  Administered 2019-03-17 – 2019-03-19 (×2): 1 via ORAL
  Filled 2019-03-17 (×2): qty 1

## 2019-03-17 MED ORDER — INFLUENZA VAC A&B SA ADJ QUAD 0.5 ML IM PRSY
0.5000 mL | PREFILLED_SYRINGE | INTRAMUSCULAR | Status: AC
  Administered 2019-03-19: 0.5 mL via INTRAMUSCULAR
  Filled 2019-03-17: qty 0.5

## 2019-03-17 MED ORDER — CARVEDILOL 12.5 MG PO TABS
12.5000 mg | ORAL_TABLET | Freq: Two times a day (BID) | ORAL | Status: DC
  Administered 2019-03-17 – 2019-03-19 (×3): 12.5 mg via ORAL
  Filled 2019-03-17 (×3): qty 1

## 2019-03-17 MED ORDER — ATORVASTATIN CALCIUM 80 MG PO TABS
80.0000 mg | ORAL_TABLET | Freq: Every day | ORAL | Status: DC
  Administered 2019-03-17: 18:00:00 80 mg via ORAL
  Filled 2019-03-17: qty 1

## 2019-03-17 MED ORDER — ALBUTEROL SULFATE (2.5 MG/3ML) 0.083% IN NEBU: 2.5000 mg | INHALATION_SOLUTION | Freq: Four times a day (QID) | RESPIRATORY_TRACT | Status: DC | PRN

## 2019-03-17 MED ORDER — ASPIRIN EC 81 MG PO TBEC
81.0000 mg | DELAYED_RELEASE_TABLET | Freq: Every day | ORAL | Status: DC
  Administered 2019-03-17 – 2019-03-19 (×2): 81 mg via ORAL
  Filled 2019-03-17 (×2): qty 1

## 2019-03-17 MED ORDER — ALBUTEROL SULFATE HFA 108 (90 BASE) MCG/ACT IN AERS
2.0000 | INHALATION_SPRAY | Freq: Once | RESPIRATORY_TRACT | Status: AC
  Administered 2019-03-17: 13:00:00 2 via RESPIRATORY_TRACT
  Filled 2019-03-17: qty 6.7

## 2019-03-17 MED ORDER — PANTOPRAZOLE SODIUM 20 MG PO TBEC
20.0000 mg | DELAYED_RELEASE_TABLET | Freq: Every day | ORAL | Status: DC
  Administered 2019-03-17 – 2019-03-19 (×2): 20 mg via ORAL
  Filled 2019-03-17 (×2): qty 1

## 2019-03-17 MED ORDER — ACETAMINOPHEN 650 MG RE SUPP: 650.0000 mg | Freq: Four times a day (QID) | RECTAL | Status: DC | PRN

## 2019-03-17 MED ORDER — HYDRALAZINE HCL 50 MG PO TABS
50.0000 mg | ORAL_TABLET | Freq: Three times a day (TID) | ORAL | Status: DC
  Administered 2019-03-17 – 2019-03-19 (×4): 50 mg via ORAL
  Filled 2019-03-17 (×2): qty 1
  Filled 2019-03-17: qty 2
  Filled 2019-03-17: qty 1

## 2019-03-17 MED ORDER — ONDANSETRON HCL 4 MG/2ML IJ SOLN: 4.0000 mg | Freq: Four times a day (QID) | INTRAMUSCULAR | Status: DC | PRN

## 2019-03-17 MED ORDER — CLOPIDOGREL BISULFATE 75 MG PO TABS
75.0000 mg | ORAL_TABLET | Freq: Every day | ORAL | Status: DC
  Administered 2019-03-17 – 2019-03-19 (×2): 75 mg via ORAL
  Filled 2019-03-17 (×2): qty 1

## 2019-03-17 MED ORDER — ALBUTEROL (5 MG/ML) CONTINUOUS INHALATION SOLN
10.0000 mg/h | INHALATION_SOLUTION | Freq: Once | RESPIRATORY_TRACT | Status: AC
  Administered 2019-03-17: 14:00:00 10 mg/h via RESPIRATORY_TRACT
  Filled 2019-03-17: qty 20

## 2019-03-17 MED ORDER — NITROGLYCERIN 0.4 MG SL SUBL: 0.4000 mg | SUBLINGUAL_TABLET | Freq: Once | SUBLINGUAL | Status: DC

## 2019-03-17 MED ORDER — ONDANSETRON HCL 4 MG PO TABS: 4.0000 mg | ORAL_TABLET | Freq: Four times a day (QID) | ORAL | Status: DC | PRN

## 2019-03-17 MED ORDER — SODIUM CHLORIDE 0.9% FLUSH
3.0000 mL | Freq: Two times a day (BID) | INTRAVENOUS | Status: DC
  Administered 2019-03-17 – 2019-03-18 (×2): 3 mL via INTRAVENOUS

## 2019-03-17 MED ORDER — HEPARIN SODIUM (PORCINE) 5000 UNIT/ML IJ SOLN
5000.0000 [IU] | Freq: Three times a day (TID) | INTRAMUSCULAR | Status: DC
  Administered 2019-03-17 – 2019-03-19 (×5): 5000 [IU] via SUBCUTANEOUS
  Filled 2019-03-17 (×5): qty 1

## 2019-03-17 NOTE — ED Notes (Signed)
Paperwork from dialysis center given to pt's daughter, [name] to give to RN/MD when pt is transported to room upstairs.

## 2019-03-17 NOTE — ED Notes (Signed)
Notified Dr. Roslynn Amble of pt's elevated troponin level of 51.

## 2019-03-17 NOTE — ED Triage Notes (Signed)
Pt arrives with [facility] from [facility] in [place] during dialysis tx this morning. Pt had gained 6 lbs, and was able to get 5 lbs off during tx and did not complete it. Pt used inhaler prior to EMS arrival; given 0.5 mg atrovent and 5 mg albuterol neb en route by EMS. Pt was 100% on 2L [place] post neb; prior to neb, pt's O2 sat was 90% on room air.  EMS vitals: 194/80 HR 98

## 2019-03-17 NOTE — ED Notes (Addendum)
Notified RT of negative COVID test for neb tx.

## 2019-03-17 NOTE — Consult Note (Addendum)
Cardiology Consultation:   Patient ID: Monica [name]; [id number]; 07-09-1930   Admit date: 03/17/2019 Date of Consult: 03/17/2019  Primary Care Provider: Benito Mccreedy, MD Primary Cardiologist: Monica Dawley, MD 01/08/2018 Monica Perch, NP: 03/15/2019 Primary Electrophysiologist:  None   Patient Profile:   Monica [name] is a 85 y.o. female with a hx of CABG 2017, NSTEMI 01/2019 with PCI, chronic combined CHF, moderate MR, anemia, bradycardia/sinus node dysfunction (tolerates Coreg), ESRD on HD TThS (fistula 09/2018, using tunneled IJ till fistula ok to use), GERD, DM2, HTN, HLD, who is being seen today for the evaluation of chest pain and SOB at the request of Monica [name].  History of Present Illness:   Monica. [name] has been having problems with these symptoms for a while.  At her office visit 09/08, she was complaining of them. CXR 08/28 showed small R effusion, mild vascular congestion w/out overt edema.   Imdur had been increased, on Coreg, no change in sx.  Nephrology has been working with pt to remove enough fluid. She was having chest pain at HD sessions and cramps as well. They decreased amount pulled at HD to 3.3 lbs for a while, to make sure they were not pulling too much. Went back up to 5 lbs off at HD in August.  SOB episodes happen consistently in the morning. She is not wheezing, just feels SOB. She takes Benadryl and uses inhaler (too many times). Will happen in the afternoon/evening as well. She uses the inhaler again and may take Benadryl. Sx will improve.   If the SOB is treated, she will not get CP. If the SOB is not treated, she will get chest pain. Her previous angina was pain and pressure.   Today after being on HD for 4/5 hours, she developed SOB, then chest pressure up to 7/10. According to EMS, pt was up 6 lbs. Unclear if they were to remove all 6 lbs and did not, or if they were going for 5 lbs, to prevent cramping.  There was some difference in the chest  pressure today from her usual chest pressure.   EMS gave her Duoneb and O2 sats improved from 90%>>98%>>O2 added and sats 100%. She reports some improvement in her breathing but it has gotten worse again. The chest pressure never changed. She feels like she cannot take a deep breath, feels it cuts off.   These are the same sx she as been getting 1-2 x day for at least a month.   SBP has been elevated, 150s-170s most of the time.   On non-HD days, once the SOB is relieved, she is able to do light housework, walk around the house, do ADLs and not feel SOB or CP.  According to her daughter, she eats and drinks poorly. Does not get much over a liter per day, by description. Does not eat 3 good meals/day.  Pt does not take any meds before HD. She is to bring them to HD and take them as she finishes so does not miss any doses.   Past Medical History:  Diagnosis Date  . Anemia   . Asthma   . Chronic combined systolic and diastolic CHF (congestive heart failure) ([place])    a. Echo 11/19 [facility]):  EF 55-65, normal wall motion, normal diastolic function. b. Echo 01/2019 EF 30-35%, moderate MR, mild AI.  Mar[place] Kitchen Coronary artery disease    a. s/p CABG 2017. b.s/p NSTEMI 11/19 [facility]) >> S-PDA 100 >>PCI: 2.25 x 24 mm  Synergy DES to S-OM. c. NSTEMI 01/2019 - occluded SVG-distal RCA, severe proximal to stent/ISR of SVG-OM s/p overlapping DES, DES to ostial prox SVG to the RI.   Marland Kitchen ESRD (end stage renal disease)    Dialysis Tu, Th, Sa  . Essential hypertension   . Gastroesophageal reflux disease   . History of blood transfusion   . Hyperlipidemia   . Junctional bradycardia   . Moderate mitral regurgitation   . Peptic ulcer   . Protein calorie malnutrition ([place])   . Sinus node dysfunction ([facility])   . Type 2 diabetes mellitus ([place])     Past Surgical History:  Procedure Laterality Date  . ABDOMINAL HYSTERECTOMY    . CARDIAC CATHETERIZATION N/A 09/17/2015   Procedure: Left Heart Cath and  Coronary Angiography;  Surgeon: Jettie Booze, MD;  Location: [place] [facility];  Service: Cardiovascular;  Laterality: N/A;  . COLON SURGERY    . CORONARY ARTERY BYPASS GRAFT N/A 09/19/2015   Procedure: CORONARY ARTERY BYPASS GRAFTING (CABG) TIMES FOUR USING BILATARAL SAPHENOUS VEIN GRAFTS AND LEFT INTERNAL MAMMARY ARTERY;  Surgeon: [name], MD;  Location: [place];  Service: Open Heart Surgery;  Laterality: N/A;  . CORONARY STENT INTERVENTION N/A 01/10/2019   Procedure: CORONARY STENT INTERVENTION;  Surgeon: Leonie Man, MD;  Location: [place] [facility];  Service: Cardiovascular;  Laterality: N/A;  . LEFT HEART CATH AND CORS/GRAFTS ANGIOGRAPHY N/A 01/10/2019   Procedure: LEFT HEART CATH AND CORS/GRAFTS ANGIOGRAPHY;  Surgeon: Leonie Man, MD;  Location: [place] [facility];  Service: Cardiovascular;  Laterality: N/A;  . LEFT HEART CATH AND CORS/GRAFTS ANGIOGRAPHY N/A 02/11/2019   Procedure: LEFT HEART CATH AND CORS/GRAFTS ANGIOGRAPHY;  Surgeon: Burnell Blanks, MD;  Location: [place] [facility];  Service: Cardiovascular;  Laterality: N/A;  . TEE WITHOUT CARDIOVERSION N/A 09/19/2015   Procedure: TRANSESOPHAGEAL ECHOCARDIOGRAM (TEE);  Surgeon: [name], MD;  Location: [place];  Service: Open Heart Surgery;  Laterality: N/A;     Prior to Admission medications   Medication Sig Start Date End Date Taking? Authorizing Provider  acetaminophen (TYLENOL) 500 MG tablet Take 1 tablet (500 mg total) by mouth every 6 (six) hours as needed for mild pain or headache. 02/12/19  Yes Eugenie Filler, MD  albuterol (PROVENTIL HFA;VENTOLIN HFA) 108 (90 Base) MCG/ACT inhaler Inhale 2 puffs into the lungs every 6 (six) hours as needed for wheezing or shortness of breath.   Yes [provider]  allopurinol (ZYLOPRIM) 100 MG tablet Take 100 mg by mouth 2 (two) times daily.    Yes [provider]  aspirin EC 81 MG EC tablet Take 1 tablet (81 mg total) by mouth daily.  10/03/15  Yes Barrett, Erin R, PA-C  atorvastatin (LIPITOR) 80 MG tablet Take 80 mg by mouth daily at 6 PM.  07/12/18  Yes [provider]  brimonidine (ALPHAGAN P) 0.1 % SOLN Place 1 drop into both eyes 2 (two) times daily.   Yes [provider]  carvedilol (COREG) 12.5 MG tablet Take 1 tablet (12.5 mg total) by mouth 2 (two) times daily. 03/04/19  Yes Dorothy Spark, MD  Cholecalciferol (VITAMIN D-3) 1000 units CAPS Take 1,000 Units by mouth daily.    Yes [provider]  clopidogrel (PLAVIX) 75 MG tablet Take 75 mg by mouth daily.    Yes [provider]  diphenhydrAMINE (BENADRYL) 25 MG tablet Take 25 mg by mouth as needed for itching or allergies.  Yes [provider]  ferric citrate (AURYXIA) 1 GM 210 MG(Fe) tablet Take 210 mg by mouth 3 (three) times daily with meals.    Yes [provider]  hydrALAZINE (APRESOLINE) 25 MG tablet Take 2 tablets (50 mg total) by mouth 3 (three) times daily. 02/18/19 05/19/19 Yes Dunn, Dayna N, PA-C  isosorbide mononitrate (IMDUR) 60 MG 24 hr tablet Take 2 tablets (120 mg total) by mouth daily. 02/25/19  Yes Isaiah Serge, NP  latanoprost (XALATAN) 0.005 % ophthalmic solution Place 1 drop into both eyes at bedtime. 07/05/18  Yes [provider]  loratadine (CLARITIN) 10 MG tablet Take 10 mg by mouth as needed for allergies.    Yes [provider]  Melatonin 5 MG CAPS Take 5 mg by mouth at bedtime.    Yes [provider]  multivitamin (RENA-VIT) TABS tablet Take 1 tablet by mouth daily.   Yes [provider]  nitroGLYCERIN (NITROSTAT) 0.4 MG SL tablet Place 0.4 mg under the tongue every 5 (five) minutes as needed for chest pain.   Yes [provider]  pantoprazole (PROTONIX) 20 MG tablet Take 20 mg by mouth daily.   Yes [provider]    Inpatient Medications: Scheduled Meds: . albuterol  10 mg/hr Nebulization Once   Continuous Infusions:  PRN  Meds:   Allergies:   No Known Allergies  Social History:   Social History   Socioeconomic History  . Marital status: Widowed    Spouse name: Not on file  . Number of children: Not on file  . Years of education: Not on file  . Highest education level: Not on file  Occupational History  . Occupation: Retired  Scientific laboratory technician  . Financial resource strain: Not hard at all  . Food insecurity    Worry: Never true    Inability: Never true  . Transportation needs    Medical: No    Non-medical: No  Tobacco Use  . Smoking status: Never Smoker  . Smokeless tobacco: Former Systems developer    Types: Snuff  Substance and Sexual Activity  . Alcohol use: No    Alcohol/week: 0.0 standard drinks  . Drug use: No  . Sexual activity: Not on file  Lifestyle  . Physical activity    Days per week: Not on file    Minutes per session: Not on file  . Stress: Not on file  Relationships  . Social connections    Talks on phone: More than three times a week    Gets together: More than three times a week    Attends religious service: More than 4 times per year    Active member of club or organization: No    Attends meetings of clubs or organizations: Never    Relationship status: Widowed  . Intimate partner violence    Fear of current or ex partner: Not on file    Emotionally abused: Not on file    Physically abused: Not on file    Forced sexual activity: Not on file  Other Topics Concern  . Not on file  Social History Narrative  . Not on file    Family History:   Family History  Problem Relation Age of Onset  . Heart attack Father    Family Status:  Family Status  Relation Name Status  . Mother  Deceased  . Father  Deceased  . MGM  Deceased  . MGF  Deceased  . PGM  Deceased  . PGF  Deceased    ROS:  Please see the history of present illness.  All other ROS reviewed and negative.     Physical Exam/Data:   Vitals:   03/17/19 1300 03/17/19 1315 03/17/19 1330 03/17/19 1345  BP: (!)  149/87 (!) 154/111 (!) 167/79 (!) 160/80  Pulse: 91 91 91 89  Resp: 19 17 18 14   Temp:      TempSrc:      SpO2: 95% 94% 94% 92%  Weight:      Height:       No intake or output data in the 24 hours ending 03/17/19 1404 Filed Weights   03/17/19 1025  Weight: 74.4 kg   Body mass index is 30 kg/m.  General:  Well nourished, elderly, female in respiratory distress HEENT: normal for age Lymph: no adenopathy Neck: JVD - 9-10 cm Endocrine:  No thryomegaly Vascular: No carotid bruits; 4/4 extremity pulses 2+, LUE fistula +thrill Cardiac:  normal S1, S2; RRR; no murmur  Lungs:  rales bilaterally, no wheezing, rhonchi  Abd: soft, nontender, no hepatomegaly  Ext: no edema Musculoskeletal:  No deformities, BUE and BLE strength weak but equal Skin: warm and dry  Neuro:  CNs 2-12 intact, no focal abnormalities noted Psych:  Normal affect   EKG:  The EKG was personally reviewed and demonstrates:  09/10 ECG is SR, HR 97, 1st degree AV block, lateral ST changes are old, inferior changes are new Telemetry:  Telemetry was personally reviewed and demonstrates:  SR   CV studies:   ECHO: 01/08/2019  1. The left ventricle has moderate-severely reduced systolic function, with an ejection fraction of 30-35%. The cavity size was normal. There is moderate concentric left ventricular hypertrophy. Left ventricular diastolic Doppler parameters are  consistent with pseudonormalization. Elevated left atrial and left ventricular end-diastolic pressures.  2. The right ventricle has moderately reduced systolic function. The cavity was normal. There is no increase in right ventricular wall thickness. Right ventricular systolic pressure is normal.  3. Left atrial size was moderately dilated.  4. Right atrial size was mildly dilated.  5. The mitral valve is degenerative. Moderate thickening of the mitral valve leaflet. There is mild mitral annular calcification present. Mitral valve regurgitation is moderate by  color flow Doppler.  6. The aortic valve is tricuspid. Mild thickening of the aortic valve. Aortic valve regurgitation is mild by color flow Doppler.  CATH: 02/11/2019  Dist LM to Prox LAD lesion is 30% stenosed.  Mid LAD-1 lesion is 95% stenosed.  Mid LAD-2 lesion is 100% stenosed.  Ost Cx to Mid Cx lesion is 100% stenosed.  Prox RCA lesion is 70% stenosed.  Prox RCA to Mid RCA lesion is 90% stenosed.  Mid RCA lesion is 95% stenosed.  Dist RCA lesion is 100% stenosed with 100% stenosed side branch in RPAV.  Ramus lesion is 100% stenosed.  LIMA and is normal in caliber.  There is no competitive flow  SVG-RCA.  The graft exhibits severe diffuse disease.  Origin to Prox Graft lesion is 90% stenosed.  Prox Graft to Mid Graft lesion is 80% stenosed.  Dist Graft lesion is 100% stenosed.  SVG-RI (DES placed 01/2019 patent)  SVG-OM (DES placed 01/2019 patent)  Previously placed Origin to Prox Graft stent (unknown type) is widely patent.  Previously placed Dist Graft stent (unknown type) is widely patent.   1. Chronic occlusion of the LAD is known. The LIMA to the LAD is patent 2. Chronic occlusion of the Circumflex is known. The  SVG to the ramus intermediate is patent with patent proximal stent.  The SVG to the OM is patent with patent distal body stent.  3. Chronic occlusion mid RCA. The SVG to the RCA is known to be occluded (not selectively engaged today).   Recommendations: No focal targets for PCI. Continue medical management. Consider addition of Ranexa.   Diagnostic Dominance: Right  CATH: [id number]  LV end diastolic pressure is normal.  Dist LM to Prox LAD lesion is 30% stenosed.  Mid LAD-1 lesion is 95% stenosed. Mid LAD-2 lesion is 100% stenosed.  Ramus lesion is 100% stenosed.  Ost Cx to Mid Cx lesion is 100% stenosed.  Prox RCA lesion is 70% stenosed. Prox RCA to Mid RCA lesion is 90% stenosed. Mid RCA lesion is 95% stenosed. Dist RCA lesion  is 100% stenosed with 100% stenosed side branch in RPAV.  -------------GRAFTS--------------  LIMA-LAD graft was visualized by angiography and is normal in caliber. There is no competitive flow  SVG-dRCA graft was visualized by angiography. The graft exhibits severe diffuse disease. Origin to Prox Graft lesion is 90% stenosed. Prox Graft to Mid Graft lesion is 80% stenosed follwed by Dist Graft lesion is 100% stenosed.  -----------INTERVENTION----------------  SVG-OM graft was visualized by angiography.  Mid Graft lesion is 75% stenosed.  A drug-eluting stent was successfully placed using a STENT SYNERGY DES 2.5X12. Postdilated 2.8 mm  ---------------------------  Mid Graft to Dist Graft Stent is 85% stenosed. Balloon angioplasty was performed using a BALLOON [place].  Post intervention, there is a 0% residual stenosis -in both the new stent, stent overlap in the previous stent.  SVG-RI graft was visualized by angiography. Origin to Prox Graft lesion is 85% stenosed.  A drug-eluting stent was successfully placed using a STENT SYNERGY DES 2.5X20. -Postdilated to 2.7 mm  Post intervention, there is a 0% residual stenosis.   SUMMARY  Severe native vessel CAD: 100% ostial Circumflex (LCx) and ostial Ramus Intermedius, diffuse calcified proximal LAD followed by 95% stenosis just after 1st Diag followed by 100% -all CTO; severe diffuse proximal to mid RCA disease with CTO of the distal RCA  Essentially occluded/atretic SVG-dRCA  Severe proximal to stent and in-stent restenosis in SVG-OM ? Successful PTCA followed by DES PCI with overlapping DES stent (Synergy 2.5 mm x 12 mm - 2.8 mm)  Severe ostial-proximal SVG-RI ? Successful DES PCI (Synergy 2.5 mm x 20 mm--2.7 mm)  Normal LVEDP despite significant systemic hypertension  Laboratory Data:   Chemistry Recent Labs  Lab 03/17/19 1100  NA 138  K 4.3  CL 101  CO2 23  GLUCOSE 224*  BUN 19  CREATININE 2.98*   CALCIUM 8.4*  GFRNONAA 14*  GFRAA 16*  ANIONGAP 14    Lab Results  Component Value Date   ALT 24 02/10/2019   AST 31 02/10/2019   ALKPHOS 84 02/10/2019   BILITOT 0.6 02/10/2019   Hematology Recent Labs  Lab 03/17/19 1100  WBC 8.4  RBC 3.08*  HGB 9.2*  HCT 29.4*  MCV 95.5  MCH 29.9  MCHC 31.3  RDW 15.4  PLT 158   Cardiac Enzymes High Sensitivity Troponin:   Recent Labs  Lab 03/17/19 1100  TROPONINIHS 27*     BNPNo results for input(s): BNP, PROBNP in the last 168 hours.   TSH:  Lab Results  Component Value Date   TSH 2.019 02/10/2019   Lipids: Lab Results  Component Value Date   CHOL 122 02/11/2019   HDL 50  02/11/2019   LDLCALC 63 02/11/2019   TRIG 43 02/11/2019   CHOLHDL 2.4 02/11/2019   HgbA1c: Lab Results  Component Value Date   HGBA1C 7.0 (H) 01/08/2019   Magnesium:  Magnesium  Date Value Ref Range Status  09/20/2015 2.1 1.7 - 2.4 mg/dL Final     Radiology/Studies:  Dg Chest Portable 1 View  Result Date: 03/17/2019 CLINICAL DATA:  Shortness of breath during dialysis. EXAM: PORTABLE CHEST 1 VIEW COMPARISON:  Chest radiograph dated 03/05/2019 and 02/10/2019. FINDINGS: The heart size remains enlarged. The lung bases are obscured which likely reflects small bilateral pleural effusions with associated atelectasis/airspace disease. There is mild pulmonary vascular congestion. There is no pneumothorax. A right internal jugular dialysis catheter tip overlies the right atrium, unchanged. Median sternotomy wires are unchanged. IMPRESSION: Small bilateral pleural effusions with associated atelectasis/airspace disease. Mild pulmonary vascular congestion. Electronically Signed   By: Zerita Boers M.D.   On: 03/17/2019 10:47   CXR 2 VIEW: 03/05/2019 FINDINGS:  Right IJ approach tunneled hemodialysis catheter. Catheter tips are  in the right atrium. Stable cardiomegaly. Patient is status post  sternotomy with evidence of prior multivessel CABG.  Atherosclerotic  calcifications present in the transverse aorta. Small layering right  pleural effusion. Mild vascular congestion without overt edema. No  focal airspace consolidation or pneumothorax. No acute osseous  abnormality.    IMPRESSION:  1. Cardiomegaly and mild vascular congestion without overt pulmonary  edema.  2. Small right pleural effusion.  3. Well-positioned right IJ tunneled hemodialysis catheter.      Assessment and Plan:   1. SOB - Has been struggling with this for over a month.  - thinks inhaler helps but it takes 2 hr and she uses it multiple times without hearing herself wheeze at any time. - HD has been removing 6 lbs at a time for the last 2 sessions, without improvement - May need to remove more fluid at HD, if can do without causing chest pain.  2. Chest pain - has substrate for angina in RCA territory, other vessels w/ patent grafts by recent cath - has biventricular failure w/ diastolic dysfunction as well. - gets angina after SOB starts, has in am and pm, at HD as well - consider splitting am dose of Imdur on HD days, so she has some on board - consider taking nitro instead of inhaler for am SOB to possibly prevent chest pain, until Imdur can kick in.  3. ESRD on HD - notes reviewed, at last 2 sessions, they have removed 6 lbs - on 09/08, pt had gained 8 lbs, they removed 6 - on 09/05, she had gained 7 lbs, they removed all 7 - goal wt 163 lbs - per Nephrology, they are using the fistula, she has appt to have the catheter removed 09/11  Otherwise, continue home rx, MD advise if Cards or IM admission.  Principal Problem:   SOB (shortness of breath) Active Problems:   Angina pectoris ([place])   For questions or updates, please contact [facility] Please consult [email or web address] for contact info under Cardiology/STEMI.   Signed, Rosaria Ferries, PA-C  03/17/2019 2:04 PM  Personally seen and examined. Agree with above.   Overall  challenging situation for this 85 year old female on hemodialysis with coronary artery disease status post CABG in 2017 and recent myocardial infarction in July 2020.  During hemodialysis sessions, she can have anginal symptoms.  Today was 1 of those days.  She does not take any medications prior to her hemodialysis.  The session went about 45 minutes then had to stop.  She was brought to the ER for further evaluation.  Thankfully, her high-sensitivity troponin is in the low range expected and end-stage renal disease patients and is not consistent with acute coronary syndrome.  Her EKG does show some evidence of ST segment depression in the inferior leads which correlate with her right coronary artery occlusion with meager collaterals.  Spoke with she and her daughter at length.  Currently laying on her side in bed, feeling poorly.  At home, she can have situations where she becomes short of breath.  Inhalers do not seem to help.  GEN: Elderly well nourished, well developed, in no acute distress, laying sideways in bed HEENT: normal  Neck: no JVD, carotid bruits, or masses Cardiac: RRR; no murmurs, rubs, or gallops,no edema left upper extremity fistula with thrill Respiratory:  clear to auscultation bilaterally, normal work of breathing GI: soft, nontender, nondistended, + BS Monica: no deformity or atrophy  Skin: warm and dry, no rash Neuro:  Alert and Oriented x 3, Strength and sensation are intact Psych: euthymic mood, full affect  EKG as above.  Inferior ST segment depression noted. No adverse arrhythmias personally reviewed on telemetry.  Echocardiogram with known EF of 30 to 35%.  See cardiac catheterization diagram as above.  No targets for PCI.  Assessment and plan:  Coronary artery disease with angina especially during stresses of dialysis/severe coronary artery disease/post CABG/end-stage renal disease on hemodialysis - My suggestion was to try to take her isosorbide 60 mg  prior to dialysis sessions.  She routinely does not take any medication prior to dialysis to allow for adequate blood pressure.  She is not a candidate to take Ranexa given her dialysis state.  Understandably challenging situation.  Hemodialysis puts stresses and strain on her cardiovascular system.  Thankfully today, we are not seeing any evidence of myocardial infarction.  Minimally elevated high-sensitivity troponin is to be expected and end-stage renal disease.  Ischemic changes seen on ECG especially in the inferior leads where her right coronary artery is occluded.  Spoke at length with her daughter about the situation.  When she is feeling short winded at home, asked her to administer nitroglycerin or 2.  This may also be signs of increased left ventricular end-diastolic pressure or increased fluid intravascularly and increased need for further hemodialysis. -There are no PCI targets available, no opportunity for stent placement to allow for decreased anginal symptoms. -Continue to encourage daily exercise, motion if possible.  Please let me know if we can be of further assistance. We will sign off.  Candee Furbish, MD

## 2019-03-17 NOTE — H&P (Signed)
History and Physical    Monica [name] [id number] DOB: [birth year] DOA: 03/17/2019  Referring MD/NP/PA: Madalyn Rob, MD PCP: Benito Mccreedy, MD  Consultants:  [name] - cardiology; Leazquez-Ramirez - vascular Patient coming from: Dialysis via EMS  Chief Complaint: Shortness of breath  I have personally briefly reviewed patient's old medical records in [place]   HPI: Monica [name] is a 85 y.o. female with medical history significant of HTN, HLD, ESRD on HD(TTS), CAD s/p CABG, chronic diastolic CHF, asthma, and DM type II; who presents with complaints of shortness of breath while receiving hemodialysis today.  She reports feeling a  heavy pressure and tightness on her chest while dialyzing for which her session was stopped approximately 45 minutes short of her regular time.  Normally reports dialyzing 3 and half hours.  Over the last few weeks at home the patient has been complaining of shortness of breath intermittently.  Symptoms usually happen in the morning and then intermittently throughout the day.  Reported utilizing her albuterol inhaler several times a day without improvement.  Associated symptoms included weight reportedly being up when she witnessed that dialysis and complaints of burning with urination over the last 3 days.  In route with EMS patient was given a DuoNeb breathing treatment, placed on nasal cannula oxygen, and O2 sats improved from 90% to 100%.  She denies having any significant cough, fever, loss of consciousness,  ED Course: On admission into the emergency department patient was noted to be afebrile, respiration 14-23, blood pressures 149/87-165/76, and all other vital signs maintained.  Labs significant for hemoglobin 9.2, potassium 4.3, creatinine 2.98, glucose 224, high-sensitivity troponin 27->51.  Bilateral pleural effusions with mild pulmonary vascular congestion and bibasilar atelectasis.  Patient reports that the DuoNeb breathing treatment provided some  improvement in shortness of breath symptoms.  Review of systems: Otherwise a complete 10 point review of systems was performed and negative except for as noted above in HPI.  Past Medical History:  Diagnosis Date  . Anemia   . Asthma   . Chronic combined systolic and diastolic CHF (congestive heart failure) ([place])    a. Echo 11/19 [facility]  EF 55-65, normal wall motion, normal diastolic function. b. Echo 01/2019 EF 30-35%, moderate MR, mild AI.  Marland Kitchen Coronary artery disease    a. s/p CABG 2017. b.s/p NSTEMI 11/19 [facility]) >> S-PDA 100 >>PCI: 2.25 x 24 mm Synergy DES to S-OM. c. NSTEMI 01/2019 - occluded SVG-distal RCA, severe proximal to stent/ISR of SVG-OM s/p overlapping DES, DES to ostial prox SVG to the RI.   Marland Kitchen ESRD (end stage renal disease)    Dialysis Tu, Th, Sa  . Essential hypertension   . Gastroesophageal reflux disease   . History of blood transfusion   . Hyperlipidemia   . Junctional bradycardia   . Moderate mitral regurgitation   . Peptic ulcer   . Protein calorie malnutrition ([place])   . Sinus node dysfunction ([facility])   . Type 2 diabetes mellitus ([place])     Past Surgical History:  Procedure Laterality Date  . ABDOMINAL HYSTERECTOMY    . CARDIAC CATHETERIZATION N/A 09/17/2015   Procedure: Left Heart Cath and Coronary Angiography;  Surgeon: Jettie Booze, MD;  Location: [place] [facility];  Service: Cardiovascular;  Laterality: N/A;  . COLON SURGERY    . CORONARY ARTERY BYPASS GRAFT N/A 09/19/2015   Procedure: CORONARY ARTERY BYPASS GRAFTING (CABG) TIMES FOUR USING BILATARAL SAPHENOUS VEIN GRAFTS AND LEFT INTERNAL MAMMARY ARTERY;  Surgeon: [name], MD;  Location: [place];  Service: Open Heart Surgery;  Laterality: N/A;  . CORONARY STENT INTERVENTION N/A 01/10/2019   Procedure: CORONARY STENT INTERVENTION;  Surgeon: Leonie Man, MD;  Location: [place] [facility];  Service: Cardiovascular;  Laterality: N/A;  . LEFT HEART CATH AND CORS/GRAFTS  ANGIOGRAPHY N/A 01/10/2019   Procedure: LEFT HEART CATH AND CORS/GRAFTS ANGIOGRAPHY;  Surgeon: Leonie Man, MD;  Location: [place] [facility];  Service: Cardiovascular;  Laterality: N/A;  . LEFT HEART CATH AND CORS/GRAFTS ANGIOGRAPHY N/A 02/11/2019   Procedure: LEFT HEART CATH AND CORS/GRAFTS ANGIOGRAPHY;  Surgeon: Burnell Blanks, MD;  Location: [place] [facility];  Service: Cardiovascular;  Laterality: N/A;  . TEE WITHOUT CARDIOVERSION N/A 09/19/2015   Procedure: TRANSESOPHAGEAL ECHOCARDIOGRAM (TEE);  Surgeon: [name], MD;  Location: [place];  Service: Open Heart Surgery;  Laterality: N/A;     reports that she has never smoked. She has quit using smokeless tobacco.  Her smokeless tobacco use included snuff. She reports that she does not drink alcohol or use drugs.  No Known Allergies  Family History  Problem Relation Age of Onset  . Heart attack Father     Prior to Admission medications   Medication Sig Start Date End Date Taking? Authorizing Provider  acetaminophen (TYLENOL) 500 MG tablet Take 1 tablet (500 mg total) by mouth every 6 (six) hours as needed for mild pain or headache. 02/12/19  Yes Eugenie Filler, MD  albuterol (PROVENTIL HFA;VENTOLIN HFA) 108 (90 Base) MCG/ACT inhaler Inhale 2 puffs into the lungs every 6 (six) hours as needed for wheezing or shortness of breath.   Yes [provider]  allopurinol (ZYLOPRIM) 100 MG tablet Take 100 mg by mouth 2 (two) times daily.    Yes [provider]  aspirin EC 81 MG EC tablet Take 1 tablet (81 mg total) by mouth daily. 10/03/15  Yes Barrett, Erin R, PA-C  atorvastatin (LIPITOR) 80 MG tablet Take 80 mg by mouth daily at 6 PM.  07/12/18  Yes [provider]  brimonidine (ALPHAGAN P) 0.1 % SOLN Place 1 drop into both eyes 2 (two) times daily.   Yes [provider]  carvedilol (COREG) 12.5 MG tablet Take 1 tablet (12.5 mg total) by mouth 2 (two) times daily. 03/04/19  Yes Dorothy Spark,  MD  Cholecalciferol (VITAMIN D-3) 1000 units CAPS Take 1,000 Units by mouth daily.    Yes [provider]  clopidogrel (PLAVIX) 75 MG tablet Take 75 mg by mouth daily.    Yes [provider]  diphenhydrAMINE (BENADRYL) 25 MG tablet Take 25 mg by mouth as needed for itching or allergies.    Yes [provider]  ferric citrate (AURYXIA) 1 GM 210 MG(Fe) tablet Take 210 mg by mouth 3 (three) times daily with meals.    Yes [provider]  hydrALAZINE (APRESOLINE) 25 MG tablet Take 2 tablets (50 mg total) by mouth 3 (three) times daily. 02/18/19 05/19/19 Yes Dunn, Dayna N, PA-C  isosorbide mononitrate (IMDUR) 60 MG 24 hr tablet Take 2 tablets (120 mg total) by mouth daily. 02/25/19  Yes Isaiah Serge, NP  latanoprost (XALATAN) 0.005 % ophthalmic solution Place 1 drop into both eyes at bedtime. 07/05/18  Yes [provider]  loratadine (CLARITIN) 10 MG tablet Take 10 mg by mouth as needed for allergies.    Yes [provider]  Melatonin 5 MG CAPS Take 5 mg by mouth  at bedtime.    Yes [provider]  multivitamin (RENA-VIT) TABS tablet Take 1 tablet by mouth daily.   Yes [provider]  nitroGLYCERIN (NITROSTAT) 0.4 MG SL tablet Place 0.4 mg under the tongue every 5 (five) minutes as needed for chest pain.   Yes [provider]  pantoprazole (PROTONIX) 20 MG tablet Take 20 mg by mouth daily.   Yes [provider]    Physical Exam:  Constitutional: NAD, calm, comfortable Vitals:   03/17/19 1345 03/17/19 1407 03/17/19 1415 03/17/19 1445  BP: (!) 160/80 (!) 163/86 (!) 168/81 (!) 165/76  Pulse: 89 90 89 89  Resp: 14 16 18 17   Temp:      TempSrc:      SpO2: 92% 91% 95% 96%  Weight:      Height:       Eyes: PERRL, lids and conjunctivae normal ENMT: Mucous membranes are moist. Posterior pharynx clear of any exudate or lesions.   Neck: normal, supple, no masses, no thyromegaly Respiratory: clear to  auscultation bilaterally, no wheezing.  Mild crackles heard near the bases bilaterally.  Normal respiratory effort. No accessory muscle use.  Cardiovascular: 1+ pitting lower extremity edema. 2+ pedal pulses. No carotid bruits.  Left arm fistula with palpable thrill. Abdomen: no tenderness, no masses palpated. No hepatosplenomegaly. Bowel sounds positive.  Musculoskeletal: no clubbing / cyanosis.  Thoracic kyphosis present.  Normal muscle tone.  Skin: no rashes, lesions, ulcers. No induration Neurologic: CN 2-12 grossly intact. Sensation intact, DTR normal. Strength 5/5 in all 4.  Psychiatric: Normal judgment and insight. Alert and oriented x 3. Normal mood.     Labs on Admission: I have personally reviewed following labs and imaging studies  CBC: Recent Labs  Lab 03/17/19 1100  WBC 8.4  NEUTROABS 6.2  HGB 9.2*  HCT 29.4*  MCV 95.5  PLT 0000000   Basic Metabolic Panel: Recent Labs  Lab 03/17/19 1100  NA 138  K 4.3  CL 101  CO2 23  GLUCOSE 224*  BUN 19  CREATININE 2.98*  CALCIUM 8.4*   GFR: Estimated Creatinine Clearance: 12.8 mL/min (A) (by C-G formula based on SCr of 2.98 mg/dL (H)). Liver Function Tests: No results for input(s): AST, ALT, ALKPHOS, BILITOT, PROT, ALBUMIN in the last 168 hours. No results for input(s): LIPASE, AMYLASE in the last 168 hours. No results for input(s): AMMONIA in the last 168 hours. Coagulation Profile: No results for input(s): INR, PROTIME in the last 168 hours. Cardiac Enzymes: No results for input(s): CKTOTAL, CKMB, CKMBINDEX, TROPONINI in the last 168 hours. BNP (last 3 results) No results for input(s): PROBNP in the last 8760 hours. HbA1C: No results for input(s): HGBA1C in the last 72 hours. CBG: No results for input(s): GLUCAP in the last 168 hours. Lipid Profile: No results for input(s): CHOL, HDL, LDLCALC, TRIG, CHOLHDL, LDLDIRECT in the last 72 hours. Thyroid Function Tests: No results for input(s): TSH, T4TOTAL, FREET4,  T3FREE, THYROIDAB in the last 72 hours. Anemia Panel: No results for input(s): VITAMINB12, FOLATE, FERRITIN, TIBC, IRON, RETICCTPCT in the last 72 hours. Urine analysis:    Component Value Date/Time   COLORURINE YELLOW 01/17/2019 1920   APPEARANCEUR CLOUDY (A) 01/17/2019 1920   APPEARANCEUR Hazy 07/09/2012 0108   LABSPEC 1.010 01/17/2019 1920   LABSPEC 1.010 07/09/2012 0108   PHURINE 7.5 01/17/2019 1920   GLUCOSEU NEGATIVE 01/17/2019 1920   GLUCOSEU >=500 07/09/2012 0108   HGBUR MODERATE (A) 01/17/2019 1920   BILIRUBINUR NEGATIVE 01/17/2019 1920  BILIRUBINUR Negative 07/09/2012 0108   KETONESUR NEGATIVE 01/17/2019 1920   PROTEINUR 100 (A) 01/17/2019 1920   UROBILINOGEN 0.2 11/22/2012 1809   NITRITE POSITIVE (A) 01/17/2019 1920   LEUKOCYTESUR LARGE (A) 01/17/2019 1920   LEUKOCYTESUR 1+ 07/09/2012 0108   Sepsis Labs: Recent Results (from the past 240 hour(s))  SARS Coronavirus 2 [facility] order, Performed in [facility] hospital lab) Nasopharyngeal Nasopharyngeal Swab     Status: None   Collection Time: 03/17/19 12:31 PM   Specimen: Nasopharyngeal Swab  Result Value Ref Range Status   SARS Coronavirus 2 NEGATIVE NEGATIVE Final    Comment: (NOTE) If result is NEGATIVE SARS-CoV-2 target nucleic acids are NOT DETECTED. The SARS-CoV-2 RNA is generally detectable in upper and lower  respiratory specimens during the acute phase of infection. The lowest  concentration of SARS-CoV-2 viral copies this assay can detect is 250  copies / mL. A negative result does not preclude SARS-CoV-2 infection  and should not be used as the sole basis for treatment or other  patient management decisions.  A negative result may occur with  improper specimen collection / handling, submission of specimen other  than nasopharyngeal swab, presence of viral mutation(s) within the  areas targeted by this assay, and inadequate number of viral copies  (<250 copies / mL). A negative result must be combined  with clinical  observations, patient history, and epidemiological information. If result is POSITIVE SARS-CoV-2 target nucleic acids are DETECTED. The SARS-CoV-2 RNA is generally detectable in upper and lower  respiratory specimens dur ing the acute phase of infection.  Positive  results are indicative of active infection with SARS-CoV-2.  Clinical  correlation with patient history and other diagnostic information is  necessary to determine patient infection status.  Positive results do  not rule out bacterial infection or co-infection with other viruses. If result is PRESUMPTIVE POSTIVE SARS-CoV-2 nucleic acids MAY BE PRESENT.   A presumptive positive result was obtained on the submitted specimen  and confirmed on repeat testing.  While 2019 novel coronavirus  (SARS-CoV-2) nucleic acids may be present in the submitted sample  additional confirmatory testing may be necessary for epidemiological  and / or clinical management purposes  to differentiate between  SARS-CoV-2 and other Sarbecovirus currently known to infect humans.  If clinically indicated additional testing with an alternate test  methodology [id number]) is advised. The SARS-CoV-2 RNA is generally  detectable in upper and lower respiratory sp ecimens during the acute  phase of infection. The expected result is Negative. Fact Sheet for Patients:  StrictlyIdeas.no Fact Sheet for Healthcare Providers: BankingDealers.co.za This test is not yet approved or cleared by the [place] FDA and has been authorized for detection and/or diagnosis of SARS-CoV-2 by FDA under an Emergency Use Authorization (EUA).  This EUA will remain in effect (meaning this test can be used) for the duration of the COVID-19 declaration under Section 564(b)(1) of the Act, 21 U.S.C. section 360bbb-3(b)(1), unless the authorization is terminated or revoked sooner. Performed at [facility], [place] [address] [place], [place] [address]      Radiological Exams on Admission: Dg Chest Portable 1 View  Result Date: 03/17/2019 CLINICAL DATA:  Shortness of breath during dialysis. EXAM: PORTABLE CHEST 1 VIEW COMPARISON:  Chest radiograph dated 03/05/2019 and 02/10/2019. FINDINGS: The heart size remains enlarged. The lung bases are obscured which likely reflects small bilateral pleural effusions with associated atelectasis/airspace disease. There is mild pulmonary vascular congestion. There is no pneumothorax. A right internal  jugular dialysis catheter tip overlies the right atrium, unchanged. Median sternotomy wires are unchanged. IMPRESSION: Small bilateral pleural effusions with associated atelectasis/airspace disease. Mild pulmonary vascular congestion. Electronically Signed   By: Zerita Boers M.D.   On: 03/17/2019 10:47    EKG: Independently reviewed.  Junctional rhythm at 97 bpm with QT prolonged at 502  Assessment/Plan Dyspnea: Patient reports having episodes of shortness of breath causing chest pain.  On physical exam patient was found to have bibasilar crackles and thoracic kyphosis.  Chest x-ray showing small bilateral pleural effusions and atelectasis.  Suspect secondary to patient being fluid overloaded.  At home patient utilizing albuterol inhaler multiple times throughout the day.  She received improvement after albuterol nebulizer treatment, but reports not having a nebulizer at home with which to use. -Admit to a telemetry bed -Albuterol nebs as needed -Incentive spirometry -Patient would likely benefit from prescription for nebulizer and solution at discharge  Chest pain, elevated troponin, CAD s/p CABG: Patient presented with complaints of chest pain and shortness of breath.  High-sensitivity troponins mildly elevated up to 51.  Recently undergone cardiac catheterization on 02/11/2019 after presenting to the hospital for NSTEMI.  Patient noted to have chronic occlusion of the LAD, LIMA  to LAD patent, chronic occlusion of the circumflex, chronic occlusion of the mid RCA, and SVG to RCA occluded.  Patient's Imdur dose had been increased previously per cardiology during her last hospitalization.  EKG noting changes consistent with known areas of occlusion not amenable to endovascular correction. -Continue aspirin, Plavix, Imdur, and statin  ESRD on HD: Patient normally dialyzes Tuesdays, Thursday, and  Saturday.  Hemodialysis session today ended approximately 45 minutes premature. -Renal and carb modified diet with fluid restriction -Cardiology recommend patient taking 60 mg of Imdur prior to hemodialysis session -Dr. Justin Mend of nephrology consulted, and they will follow-up with the patient in a.m.  Combined systolic and diastolic congestive heart failure: Patient appears to be slightly fluid overloaded at this time.  Last EF noted to be 30 to 35% by echocardiogram in 01/2019. -Daily weights -Strict intake and output -Check BNP  Anemia of chronic disease: Acute on chronic.  Hemoglobin trending downward to 9.2 g/dL per review of records.  Patient denies any reports of bleeding. -Recheck CBC in a.m.  Dysuria: Patient still reports being able to make some urine and complains of some dysuria for the last 3 days. -Follow-up urinalysis  Prolonged QT interval -Recheck EKG in a.m. -Limit QT prolonging medication  Essential hypertension -Continue Coreg, hydralazine, isosorbide mononitrate  Diabetes mellitus type 2: Last hemoglobin A1c noted to be 7 on 01/08/2019.  Patient does not appear to be on any medications at home and during her last hospitalization had hypoglycemia when started on Lantus. -Hypoglycemic protocol -Renal and carb modified diet -CBGs q. before meals with sensitive SSI  Hyperlipidemia -Continue atorvastatin  GERD -Continue Protonix  DVT prophylaxis: Heparin Code Status: Full Family Communication: Discussed plan of care with the patient's daughter present at  bedside Disposition Plan: Possible discharge home in 1 to 2 days Consults called: Nephrology, cardiology Admission status: Observation  Norval Morton MD [facility] Pager [phone]   If 7PM-7AM, please contact night-coverage [email or web address] Password [email or web address]  03/17/2019, 3:10 PM

## 2019-03-17 NOTE — ED Notes (Signed)
Dinner Tray Ordered @ [phone].

## 2019-03-17 NOTE — ED Provider Notes (Signed)
[facility] EMERGENCY DEPARTMENT Provider Note   CSN: [id number] Arrival date & time: 03/17/19  1011     History   Chief Complaint Chief Complaint  Patient presents with   Shortness of Breath    HPI Monica [name] is a 85 y.o. female.  Presents emergency department from dialysis center with chief complaint shortness of breath.  Patient reported that she Had an Episode of Shortness of Breath While Getting Ready to Go to Dialysis, Resolved Spontaneously.  Then Later had an episode of shortness of breath while at dialysis.  EMS called for transportation, they gave Atrovent, albuterol treatment x1, placed on nasal cannula.  Patient states albuterol had some improvement in her symptoms.  Now also having some associated chest tightness.  Describes as chest tightness, substernal, 7 out of 10 in severity.  Similar to prior episodes of chest pain.  States her primary concern though is her shortness of breath.     HPI  Past Medical History:  Diagnosis Date   Anemia    Asthma    Chronic combined systolic and diastolic CHF (congestive heart failure) ([place])    a. Echo 11/19 [facility]):  EF 55-65, normal wall motion, normal diastolic function. b. Echo 01/2019 EF 30-35%, moderate MR, mild AI.   Coronary artery disease    a. s/p CABG 2017. b.s/p NSTEMI 11/19 [facility]) >> S-PDA 100 >>PCI: 2.25 x 24 mm Synergy DES to S-OM. c. NSTEMI 01/2019 - occluded SVG-distal RCA, severe proximal to stent/ISR of SVG-OM s/p overlapping DES, DES to ostial prox SVG to the RI.    ESRD (end stage renal disease)    Dialysis Tu, Th, Sa   Essential hypertension    Gastroesophageal reflux disease    History of blood transfusion    Hyperlipidemia    Junctional bradycardia    Moderate mitral regurgitation    Peptic ulcer    Protein calorie malnutrition (HCC)    Sinus node dysfunction ([place])    Type 2 diabetes mellitus ([place])     Patient Active Problem List   Diagnosis Date  Noted   Sinus node dysfunction ([place]) 01/25/2019   Ischemic cardiomyopathy 01/25/2019   Hyperkalemia 01/18/2019   Acute lower UTI 01/18/2019   Elevated troponin    Junctional bradycardia    Bradycardia 01/17/2019   S/P coronary artery stent placement    Acute combined systolic and diastolic heart failure (HCC)    Non-ST elevation (NSTEMI) myocardial infarction ([place]) 01/08/2019   Acute respiratory failure ([place]) 01/07/2019   Diabetes mellitus with peripheral circulatory disorder ([place]) 02/05/2018   Altered mental status 03/13/2017   Claudication in peripheral vascular disease ([place]) 12/29/2016   Abnormal weight loss 06/05/2016   Bilateral pleural effusion 11/23/2015   Chronic combined systolic and diastolic CHF (congestive heart failure) ([place]) 11/23/2015   SOB (shortness of breath) 11/23/2015   Pleural effusion 11/23/2015   CAD (coronary artery disease) 10/05/2015   S/P CABG x 4 09/19/2015   History of coronary artery bypass surgery 09/19/2015   Abnormal finding on EKG - dynamic changes 09/17/2015   Pain in the chest    Gastroesophageal reflux disease 09/15/2015   Gout 09/15/2015   Chest pain 09/15/2015   Hyperlipidemia    Diabetes mellitus with renal complications ([place])    Obesity (BMI 30.0-34.9) 08/02/2015   Asthma 04/27/2015   Vitamin D deficiency 04/27/2015   Hyperlipidemia associated with type 2 diabetes mellitus ([place]) 04/27/2015   Essential hypertension 04/24/2015   ESRD (end stage renal  disease) ([place]) 04/24/2015   Type 2 diabetes mellitus with hyperglycemia ([place]) 04/24/2015   Metatarsalgia of both feet 07/25/2014   Equinus deformity of foot, acquired 07/25/2014   Onychomycosis 07/25/2014   Pain in lower limb 07/25/2014   Acquired equinus deformity of foot 07/25/2014   Metatarsalgia 07/25/2014   Pain of lower extremity 07/25/2014    Past Surgical History:  Procedure Laterality Date   ABDOMINAL HYSTERECTOMY     CARDIAC  CATHETERIZATION N/A 09/17/2015   Procedure: Left Heart Cath and Coronary Angiography;  Surgeon: Jettie Booze, MD;  Location: [facility];  Service: Cardiovascular;  Laterality: N/A;   COLON SURGERY     CORONARY ARTERY BYPASS GRAFT N/A 09/19/2015   Procedure: CORONARY ARTERY BYPASS GRAFTING (CABG) TIMES FOUR USING BILATARAL SAPHENOUS VEIN GRAFTS AND LEFT INTERNAL MAMMARY ARTERY;  Surgeon: [name], MD;  Location: [facility];  Service: Open Heart Surgery;  Laterality: N/A;   CORONARY STENT INTERVENTION N/A 01/10/2019   Procedure: CORONARY STENT INTERVENTION;  Surgeon: Leonie Man, MD;  Location: [facility];  Service: Cardiovascular;  Laterality: N/A;   LEFT HEART CATH AND CORS/GRAFTS ANGIOGRAPHY N/A 01/10/2019   Procedure: LEFT HEART CATH AND CORS/GRAFTS ANGIOGRAPHY;  Surgeon: Leonie Man, MD;  Location: [facility];  Service: Cardiovascular;  Laterality: N/A;   LEFT HEART CATH AND CORS/GRAFTS ANGIOGRAPHY N/A 02/11/2019   Procedure: LEFT HEART CATH AND CORS/GRAFTS ANGIOGRAPHY;  Surgeon: Burnell Blanks, MD;  Location: [facility];  Service: Cardiovascular;  Laterality: N/A;   TEE WITHOUT CARDIOVERSION N/A 09/19/2015   Procedure: TRANSESOPHAGEAL ECHOCARDIOGRAM (TEE);  Surgeon: [name], MD;  Location: [facility];  Service: Open Heart Surgery;  Laterality: N/A;     OB History   No obstetric history on file.      Home Medications    Prior to Admission medications   Medication Sig Start Date End Date Taking? Authorizing Provider  acetaminophen (TYLENOL) 500 MG tablet Take 1 tablet (500 mg total) by mouth every 6 (six) hours as needed for mild pain or headache. 02/12/19  Yes Eugenie Filler, MD  albuterol (PROVENTIL HFA;VENTOLIN HFA) 108 (90 Base) MCG/ACT inhaler Inhale 2 puffs into the lungs every 6 (six) hours as needed for wheezing or shortness of breath.   Yes [provider]  allopurinol (ZYLOPRIM) 100 MG tablet Take 100 mg  by mouth 2 (two) times daily.    Yes [provider]  aspirin EC 81 MG EC tablet Take 1 tablet (81 mg total) by mouth daily. 10/03/15  Yes Barrett, Erin R, PA-C  atorvastatin (LIPITOR) 80 MG tablet Take 80 mg by mouth daily at 6 PM.  07/12/18  Yes [provider]  brimonidine (ALPHAGAN P) 0.1 % SOLN Place 1 drop into both eyes 2 (two) times daily.   Yes [provider]  carvedilol (COREG) 12.5 MG tablet Take 1 tablet (12.5 mg total) by mouth 2 (two) times daily. 03/04/19  Yes Dorothy Spark, MD  Cholecalciferol (VITAMIN D-3) 1000 units CAPS Take 1,000 Units by mouth daily.    Yes [provider]  clopidogrel (PLAVIX) 75 MG tablet Take 75 mg by mouth daily.    Yes [provider]  diphenhydrAMINE (BENADRYL) 25 MG tablet Take 25 mg by mouth as needed for itching or allergies.    Yes [provider]  ferric citrate (AURYXIA) 1 GM 210 MG(Fe) tablet Take 210 mg by mouth 3 (three) times daily with meals.  Yes [provider]  hydrALAZINE (APRESOLINE) 25 MG tablet Take 2 tablets (50 mg total) by mouth 3 (three) times daily. 02/18/19 05/19/19 Yes Dunn, Dayna N, PA-C  isosorbide mononitrate (IMDUR) 60 MG 24 hr tablet Take 2 tablets (120 mg total) by mouth daily. 02/25/19  Yes Isaiah Serge, NP  latanoprost (XALATAN) 0.005 % ophthalmic solution Place 1 drop into both eyes at bedtime. 07/05/18  Yes [provider]  loratadine (CLARITIN) 10 MG tablet Take 10 mg by mouth as needed for allergies.    Yes [provider]  Melatonin 5 MG CAPS Take 5 mg by mouth at bedtime.    Yes [provider]  multivitamin (RENA-VIT) TABS tablet Take 1 tablet by mouth daily.   Yes [provider]  nitroGLYCERIN (NITROSTAT) 0.4 MG SL tablet Place 0.4 mg under the tongue every 5 (five) minutes as needed for chest pain.   Yes [provider]  pantoprazole (PROTONIX) 20 MG tablet Take 20 mg by mouth daily.   Yes [provider]    Family History Family History  Problem Relation Age of Onset   Heart attack Father     Social History Social History   Tobacco Use   Smoking status: Never Smoker   Smokeless tobacco: Former Systems developer    Types: Snuff  Substance Use Topics   Alcohol use: No    Alcohol/week: 0.0 standard drinks   Drug use: No     Allergies   Patient has no known allergies.   Review of Systems Review of Systems  Constitutional: Negative for chills and fever.  HENT: Negative for ear pain and sore throat.   Eyes: Negative for pain and visual disturbance.  Respiratory: Positive for shortness of breath. Negative for cough.   Cardiovascular: Positive for chest pain. Negative for palpitations.  Gastrointestinal: Negative for abdominal pain and vomiting.  Genitourinary: Negative for dysuria and hematuria.  Musculoskeletal: Negative for arthralgias and back pain.  Skin: Negative for color change and rash.  Neurological: Negative for seizures and syncope.  All other systems reviewed and are negative.    Physical Exam Updated Vital Signs BP (!) 163/71    Pulse 92    Temp 97.7 F (36.5 C) (Oral)    Resp 17    Ht 5\' 2"  (1.575 m)    Wt 74.4 kg    SpO2 100%    BMI 30.00 kg/m   Physical Exam Vitals signs and nursing note reviewed.  Constitutional:      General: She is not in acute distress.    Appearance: She is well-developed.  HENT:     Head: Normocephalic and atraumatic.  Eyes:     Conjunctiva/sclera: Conjunctivae normal.  Neck:     Musculoskeletal: Neck supple.  Cardiovascular:     Rate and Rhythm: Normal rate and regular rhythm.     Heart sounds: No murmur.  Pulmonary:     Comments: Mild tachypnea, but no significant distress; mild end expiratory wheeze noted; speaking in full sentences Chest:     Chest wall: No mass or deformity.  Abdominal:     Palpations: Abdomen is soft.     Tenderness: There is no abdominal tenderness.  Musculoskeletal:     Right lower  leg: She exhibits no tenderness. No edema.     Left lower leg: She exhibits no tenderness. No edema.  Skin:    General: Skin is warm and dry.     Capillary Refill: Capillary refill takes less than 2  seconds.  Neurological:     General: No focal deficit present.     Mental Status: She is alert and oriented to person, place, and time.  Psychiatric:        Mood and Affect: Mood normal.        Behavior: Behavior normal.      ED Treatments / Results  Labs (all labs ordered are listed, but only abnormal results are displayed) Labs Reviewed  CBC WITH DIFFERENTIAL/PLATELET - Abnormal; Notable for the following components:      Result Value   RBC 3.08 (*)    Hemoglobin 9.2 (*)    HCT 29.4 (*)    All other components within normal limits  BASIC METABOLIC PANEL  TROPONIN I (HIGH SENSITIVITY)  TROPONIN I (HIGH SENSITIVITY)    EKG EKG Interpretation  Date/Time:  Thursday March 17 2019 10:34:14 EDT Ventricular Rate:  97 PR Interval:    QRS Duration: 109 QT Interval:  395 QTC Calculation: 502 R Axis:   85 Text Interpretation:  Accelerated junctional rhythm Anterior infarct, old Prolonged QT interval St depressions in II, III, aVF and V6  No significant ST elevations No acute MI, confirmed with Dr. Burt Knack Confirmed by Madalyn Rob [phone]) on 03/17/2019 11:10:41 AM   Radiology Dg Chest Portable 1 View  Result Date: 03/17/2019 CLINICAL DATA:  Shortness of breath during dialysis. EXAM: PORTABLE CHEST 1 VIEW COMPARISON:  Chest radiograph dated 03/05/2019 and 02/10/2019. FINDINGS: The heart size remains enlarged. The lung bases are obscured which likely reflects small bilateral pleural effusions with associated atelectasis/airspace disease. There is mild pulmonary vascular congestion. There is no pneumothorax. A right internal jugular dialysis catheter tip overlies the right atrium, unchanged. Median sternotomy wires are unchanged. IMPRESSION: Small bilateral pleural effusions with  associated atelectasis/airspace disease. Mild pulmonary vascular congestion. Electronically Signed   By: Zerita Boers M.D.   On: 03/17/2019 10:47    Procedures Procedures (including critical care time)  Medications Ordered in ED Medications  albuterol (VENTOLIN HFA) 108 (90 Base) MCG/ACT inhaler 2 puff (has no administration in time range)     Initial Impression / Assessment and Plan / ED Course  I have reviewed the triage vital signs and the nursing notes.  Pertinent labs & imaging results that were available during my care of the patient were reviewed by me and considered in my medical decision making (see chart for details).  Clinical Course as of Mar 17 1203  Thu Mar 17, 2019  1046 EKG reviewed by Dr. Burt Knack - no Acute STEMI; their team will come evaluate   [RD]  1200 Rechecked patient   [RD]    Clinical Course User Index [RD] Lucrezia Starch, MD       85 year old lady ESRD, extensive coronary artery disease, asthma presents the ER with chest pain shortness of breath while at dialysis.  Here patient continued to have some chest discomfort as well as feeling short of breath, noted wheezing on exam, vitals stable.  Concern for angina versus asthma exacerbation.  Treated with albuterol, consulted cardiology.  Cardiology recommended ongoing medical management, no acute cardiology interventions needed at this time.  They recommended hospitalist admission for symptom control.  Discussed case with Dr. Tamala Julian who agrees to admit patient to tele bed.  Final Clinical Impressions(s) / ED Diagnoses   Final diagnoses:  Chest pain, unspecified type  Asthma with acute exacerbation, unspecified asthma severity, unspecified whether persistent  Exacerbation of asthma, unspecified asthma severity, unspecified whether persistent    ED  Discharge Orders    None       Lucrezia Starch, MD 03/17/19 [phone]

## 2019-03-18 ENCOUNTER — Ambulatory Visit: Payer: Self-pay | Admitting: *Deleted

## 2019-03-18 DIAGNOSIS — N186 End stage renal disease: Secondary | ICD-10-CM | POA: Diagnosis present

## 2019-03-18 DIAGNOSIS — M40204 Unspecified kyphosis, thoracic region: Secondary | ICD-10-CM | POA: Diagnosis present

## 2019-03-18 DIAGNOSIS — I209 Angina pectoris, unspecified: Secondary | ICD-10-CM | POA: Diagnosis not present

## 2019-03-18 DIAGNOSIS — E869 Volume depletion, unspecified: Secondary | ICD-10-CM | POA: Diagnosis present

## 2019-03-18 DIAGNOSIS — Z8249 Family history of ischemic heart disease and other diseases of the circulatory system: Secondary | ICD-10-CM | POA: Diagnosis not present

## 2019-03-18 DIAGNOSIS — Z23 Encounter for immunization: Secondary | ICD-10-CM | POA: Diagnosis present

## 2019-03-18 DIAGNOSIS — D638 Anemia in other chronic diseases classified elsewhere: Secondary | ICD-10-CM | POA: Diagnosis present

## 2019-03-18 DIAGNOSIS — I340 Nonrheumatic mitral (valve) insufficiency: Secondary | ICD-10-CM | POA: Diagnosis present

## 2019-03-18 DIAGNOSIS — I259 Chronic ischemic heart disease, unspecified: Secondary | ICD-10-CM

## 2019-03-18 DIAGNOSIS — K219 Gastro-esophageal reflux disease without esophagitis: Secondary | ICD-10-CM | POA: Diagnosis present

## 2019-03-18 DIAGNOSIS — I252 Old myocardial infarction: Secondary | ICD-10-CM | POA: Diagnosis not present

## 2019-03-18 DIAGNOSIS — J45901 Unspecified asthma with (acute) exacerbation: Secondary | ICD-10-CM

## 2019-03-18 DIAGNOSIS — Z794 Long term (current) use of insulin: Secondary | ICD-10-CM | POA: Diagnosis not present

## 2019-03-18 DIAGNOSIS — R300 Dysuria: Secondary | ICD-10-CM | POA: Diagnosis present

## 2019-03-18 DIAGNOSIS — Z90710 Acquired absence of both cervix and uterus: Secondary | ICD-10-CM | POA: Diagnosis not present

## 2019-03-18 DIAGNOSIS — I2510 Atherosclerotic heart disease of native coronary artery without angina pectoris: Secondary | ICD-10-CM | POA: Diagnosis not present

## 2019-03-18 DIAGNOSIS — I132 Hypertensive heart and chronic kidney disease with heart failure and with stage 5 chronic kidney disease, or end stage renal disease: Secondary | ICD-10-CM | POA: Diagnosis present

## 2019-03-18 DIAGNOSIS — I5043 Acute on chronic combined systolic (congestive) and diastolic (congestive) heart failure: Secondary | ICD-10-CM | POA: Diagnosis present

## 2019-03-18 DIAGNOSIS — I509 Heart failure, unspecified: Secondary | ICD-10-CM | POA: Diagnosis present

## 2019-03-18 DIAGNOSIS — E1122 Type 2 diabetes mellitus with diabetic chronic kidney disease: Secondary | ICD-10-CM | POA: Diagnosis present

## 2019-03-18 DIAGNOSIS — Z87891 Personal history of nicotine dependence: Secondary | ICD-10-CM | POA: Diagnosis not present

## 2019-03-18 DIAGNOSIS — I2584 Coronary atherosclerosis due to calcified coronary lesion: Secondary | ICD-10-CM

## 2019-03-18 DIAGNOSIS — N2581 Secondary hyperparathyroidism of renal origin: Secondary | ICD-10-CM | POA: Diagnosis present

## 2019-03-18 DIAGNOSIS — E785 Hyperlipidemia, unspecified: Secondary | ICD-10-CM | POA: Diagnosis present

## 2019-03-18 DIAGNOSIS — E11649 Type 2 diabetes mellitus with hypoglycemia without coma: Secondary | ICD-10-CM | POA: Diagnosis present

## 2019-03-18 DIAGNOSIS — I5082 Biventricular heart failure: Secondary | ICD-10-CM | POA: Diagnosis present

## 2019-03-18 DIAGNOSIS — Z992 Dependence on renal dialysis: Secondary | ICD-10-CM | POA: Diagnosis not present

## 2019-03-18 DIAGNOSIS — Z20828 Contact with and (suspected) exposure to other viral communicable diseases: Secondary | ICD-10-CM | POA: Diagnosis present

## 2019-03-18 DIAGNOSIS — R0600 Dyspnea, unspecified: Secondary | ICD-10-CM | POA: Diagnosis not present

## 2019-03-18 DIAGNOSIS — R0602 Shortness of breath: Secondary | ICD-10-CM | POA: Diagnosis not present

## 2019-03-18 DIAGNOSIS — I25119 Atherosclerotic heart disease of native coronary artery with unspecified angina pectoris: Secondary | ICD-10-CM | POA: Diagnosis present

## 2019-03-18 LAB — CBC
HCT: 26.7 % — ABNORMAL LOW (ref 36.0–46.0)
Hemoglobin: 8.4 g/dL — ABNORMAL LOW (ref 12.0–15.0)
MCH: 29.8 pg (ref 26.0–34.0)
MCHC: 31.5 g/dL (ref 30.0–36.0)
MCV: 94.7 fL (ref 80.0–100.0)
Platelets: 192 10*3/uL (ref 150–400)
RBC: 2.82 MIL/uL — ABNORMAL LOW (ref 3.87–5.11)
RDW: 15.4 % (ref 11.5–15.5)
WBC: 6.6 10*3/uL (ref 4.0–10.5)
nRBC: 0 % (ref 0.0–0.2)

## 2019-03-18 LAB — HEPATITIS B SURFACE ANTIGEN: Hepatitis B Surface Ag: NEGATIVE

## 2019-03-18 LAB — GLUCOSE, CAPILLARY
Glucose-Capillary: 159 mg/dL — ABNORMAL HIGH (ref 70–99)
Glucose-Capillary: 243 mg/dL — ABNORMAL HIGH (ref 70–99)
Glucose-Capillary: 103 mg/dL — ABNORMAL HIGH (ref 70–99)
Glucose-Capillary: 98 mg/dL (ref 70–99)

## 2019-03-18 MED ORDER — URELLE 81 MG PO TABS
1.0000 | ORAL_TABLET | Freq: Three times a day (TID) | ORAL | Status: DC
  Administered 2019-03-18 – 2019-03-19 (×2): 81 mg via ORAL
  Filled 2019-03-18 (×6): qty 1

## 2019-03-18 MED ORDER — SODIUM CHLORIDE 0.9 % IV SOLN: 100.0000 mL | INTRAVENOUS | Status: DC | PRN

## 2019-03-18 MED ORDER — HEPARIN SODIUM (PORCINE) 1000 UNIT/ML DIALYSIS: 1000.0000 [IU] | INTRAMUSCULAR | Status: DC | PRN

## 2019-03-18 MED ORDER — LIDOCAINE HCL (PF) 1 % IJ SOLN: 5.0000 mL | INTRAMUSCULAR | Status: DC | PRN

## 2019-03-18 MED ORDER — NITROGLYCERIN 0.4 MG SL SUBL: 0.4000 mg | SUBLINGUAL_TABLET | SUBLINGUAL | 12 refills | Status: AC | PRN

## 2019-03-18 MED ORDER — PENTAFLUOROPROP-TETRAFLUOROETH EX AERO: 1.0000 "application " | INHALATION_SPRAY | CUTANEOUS | Status: DC | PRN

## 2019-03-18 MED ORDER — CHLORHEXIDINE GLUCONATE CLOTH 2 % EX PADS
6.0000 | MEDICATED_PAD | Freq: Every day | CUTANEOUS | Status: DC
  Administered 2019-03-18 – 2019-03-19 (×2): 6 via TOPICAL

## 2019-03-18 MED ORDER — ALTEPLASE 2 MG IJ SOLR: 2.0000 mg | Freq: Once | INTRAMUSCULAR | Status: DC | PRN

## 2019-03-18 MED ORDER — LIDOCAINE-PRILOCAINE 2.5-2.5 % EX CREA: 1.0000 "application " | TOPICAL_CREAM | CUTANEOUS | Status: DC | PRN

## 2019-03-18 NOTE — Progress Notes (Signed)
Pt requested imdur be given prior to dialysis. Stated Dr. Marlou Porch wanted it given to prevent CP and pressure during dialysis. Carroll Kinds RN

## 2019-03-18 NOTE — Consult Note (Signed)
[facility] Renal Consultation Note    Indication for Consultation:  Management of ESRD/hemodialysis, anemia, hypertension/volume, and secondary hyperparathyroidism. PCP:  HPI: Monica [name] is a 85 y.o. female with ESRD (started HD 05/2018), HTN, T2DM, HFrEF (EF 30-35%), CAD (Hx CABG 2017, stents 2020), and GERD who was admitted with dyspnea and chest pressure.   Seen in room with her daughter at bedside. She reports baseline DOE, worsened yesterday with chest pressure which prompted ED evaluation. Per notes, she has been having CP during her dialysis sessions, esp with > 2L UF. Uses albuterol inhaler without relief. Denied nausea, vomiting, diarrhea, fever, or chills. No recent sick contacts, had been eating well. In ED, vitals were ok. Labs showed K 4.3, Ca 8.4,  WBC 8.4, Hgb 9.2, ^ BNP, COVID-19 negative. CXR showed pulm edema. Cardiology was consulted - they have suggested taking PO isosorbide prior to dialysis sessions and SL nitro prn.  She dialyzes on TTS schedule at [facility] - outpatient records requested. She has recently started accessing her L AVF without issues - she is missing her appt to have her cath removed since she is here today.  Past Medical History:  Diagnosis Date  . Anemia   . Asthma   . Chronic combined systolic and diastolic CHF (congestive heart failure) ([place])    a. Echo 11/19 [facility]):  EF 55-65, normal wall motion, normal diastolic function. b. Echo 01/2019 EF 30-35%, moderate MR, mild AI.  Marland Kitchen Coronary artery disease    a. s/p CABG 2017. b.s/p NSTEMI 11/19 [facility]) >> S-PDA 100 >>PCI: 2.25 x 24 mm Synergy DES to S-OM. c. NSTEMI 01/2019 - occluded SVG-distal RCA, severe proximal to stent/ISR of SVG-OM s/p overlapping DES, DES to ostial prox SVG to the RI.   Marland Kitchen ESRD (end stage renal disease)    Dialysis Tu, Th, Sa  . Essential hypertension   . Gastroesophageal reflux disease   . History of blood transfusion   . Hyperlipidemia   .  Junctional bradycardia   . Moderate mitral regurgitation   . Peptic ulcer   . Protein calorie malnutrition ([place])   . Sinus node dysfunction (HCC)   . Type 2 diabetes mellitus ([place])    Past Surgical History:  Procedure Laterality Date  . ABDOMINAL HYSTERECTOMY    . CARDIAC CATHETERIZATION N/A 09/17/2015   Procedure: Left Heart Cath and Coronary Angiography;  Surgeon: Jettie Booze, MD;  Location: [place] [facility];  Service: Cardiovascular;  Laterality: N/A;  . COLON SURGERY    . CORONARY ARTERY BYPASS GRAFT N/A 09/19/2015   Procedure: CORONARY ARTERY BYPASS GRAFTING (CABG) TIMES FOUR USING BILATARAL SAPHENOUS VEIN GRAFTS AND LEFT INTERNAL MAMMARY ARTERY;  Surgeon: [name], MD;  Location: [place];  Service: Open Heart Surgery;  Laterality: N/A;  . CORONARY STENT INTERVENTION N/A 01/10/2019   Procedure: CORONARY STENT INTERVENTION;  Surgeon: Leonie Man, MD;  Location: [place] [facility];  Service: Cardiovascular;  Laterality: N/A;  . LEFT HEART CATH AND CORS/GRAFTS ANGIOGRAPHY N/A 01/10/2019   Procedure: LEFT HEART CATH AND CORS/GRAFTS ANGIOGRAPHY;  Surgeon: Leonie Man, MD;  Location: [place] [facility];  Service: Cardiovascular;  Laterality: N/A;  . LEFT HEART CATH AND CORS/GRAFTS ANGIOGRAPHY N/A 02/11/2019   Procedure: LEFT HEART CATH AND CORS/GRAFTS ANGIOGRAPHY;  Surgeon: Burnell Blanks, MD;  Location: [place] [facility];  Service: Cardiovascular;  Laterality: N/A;  . TEE WITHOUT CARDIOVERSION N/A 09/19/2015   Procedure: TRANSESOPHAGEAL ECHOCARDIOGRAM (TEE);  Surgeon: [name], MD;  Location: [place];  Service: Open Heart Surgery;  Laterality: N/A;   Family History  Problem Relation Age of Onset  . Heart attack Father    Social History:  reports that she has never smoked. She has quit using smokeless tobacco.  Her smokeless tobacco use included snuff. She reports that she does not drink alcohol or use drugs.  ROS: As per HPI otherwise  negative.  Physical Exam: Vitals:   03/17/19 1830 03/17/19 2013 03/17/19 2220 03/18/19 0518  BP: (!) 142/72 (!) 153/73  (!) 125/48  Pulse: 86  82   Resp:    20  Temp:  98.8 F (37.1 C)  97.9 F (36.6 C)  TempSrc:  Oral  Oral  SpO2: 96% 100%    Weight:  74.6 kg  74.7 kg  Height:         General: Well developed, elderly woman, NAD Head: Normocephalic, atraumatic, sclera non-icteric, mucus membranes are moist. Neck: Supple without lymphadenopathy/masses.  Lungs: Bibasilar dullness, no wheezing. Heart: RRR with normal S1, S2. No murmurs, rubs, or gallops appreciated. Abdomen: Soft, non-tender, non-distended with normoactive bowel sounds.  Musculoskeletal:  Strength and tone appear normal for age. Lower extremities: Trace B ankle edema Neuro: Alert and oriented X 3. Moves all extremities spontaneously. Psych:  Responds to questions appropriately with a normal affect. Dialysis Access: TDC + L AVF + bruit  No Known Allergies Prior to Admission medications   Medication Sig Start Date End Date Taking? Authorizing Provider  acetaminophen (TYLENOL) 500 MG tablet Take 1 tablet (500 mg total) by mouth every 6 (six) hours as needed for mild pain or headache. 02/12/19  Yes Eugenie Filler, MD  albuterol (PROVENTIL HFA;VENTOLIN HFA) 108 (90 Base) MCG/ACT inhaler Inhale 2 puffs into the lungs every 6 (six) hours as needed for wheezing or shortness of breath.   Yes [provider]  allopurinol (ZYLOPRIM) 100 MG tablet Take 100 mg by mouth 2 (two) times daily.    Yes [provider]  aspirin EC 81 MG EC tablet Take 1 tablet (81 mg total) by mouth daily. 10/03/15  Yes Barrett, Erin R, PA-C  atorvastatin (LIPITOR) 80 MG tablet Take 80 mg by mouth daily at 6 PM.  07/12/18  Yes [provider]  brimonidine (ALPHAGAN P) 0.1 % SOLN Place 1 drop into both eyes 2 (two) times daily.   Yes [provider]  carvedilol (COREG) 12.5 MG tablet Take 1 tablet (12.5 mg total) by  mouth 2 (two) times daily. 03/04/19  Yes Dorothy Spark, MD  Cholecalciferol (VITAMIN D-3) 1000 units CAPS Take 1,000 Units by mouth daily.    Yes [provider]  clopidogrel (PLAVIX) 75 MG tablet Take 75 mg by mouth daily.    Yes [provider]  diphenhydrAMINE (BENADRYL) 25 MG tablet Take 25 mg by mouth as needed for itching or allergies.    Yes [provider]  ferric citrate (AURYXIA) 1 GM 210 MG(Fe) tablet Take 210 mg by mouth 3 (three) times daily with meals.    Yes [provider]  hydrALAZINE (APRESOLINE) 25 MG tablet Take 2 tablets (50 mg total) by mouth 3 (three) times daily. 02/18/19 05/19/19 Yes Dunn, Dayna N, PA-C  isosorbide mononitrate (IMDUR) 60 MG 24 hr tablet Take 2 tablets (120 mg total) by mouth daily. 02/25/19  Yes Isaiah Serge, NP  latanoprost (XALATAN) 0.005 % ophthalmic solution Place 1 drop into both eyes at bedtime. 07/05/18  Yes [provider]  loratadine (CLARITIN) 10 MG tablet Take 10 mg by mouth as needed for allergies.    Yes [provider]  Melatonin 5 MG CAPS Take 5 mg by mouth at bedtime.    Yes [provider]  multivitamin (RENA-VIT) TABS tablet Take 1 tablet by mouth daily.   Yes [provider]  pantoprazole (PROTONIX) 20 MG tablet Take 20 mg by mouth daily.   Yes [provider]  nitroGLYCERIN (NITROSTAT) 0.4 MG SL tablet Place 1 tablet (0.4 mg total) under the tongue every 5 (five) minutes as needed for chest pain. 03/18/19   Barrett, Evelene Croon, PA-C   Current Facility-Administered Medications  Medication Dose Route Frequency Provider Last Rate Last Dose  . acetaminophen (TYLENOL) tablet 650 mg  650 mg Oral Q6H PRN Norval Morton, MD       Or  . acetaminophen (TYLENOL) suppository 650 mg  650 mg Rectal Q6H PRN Smith, Rondell A, MD      . albuterol (PROVENTIL) (2.5 MG/3ML) 0.083% nebulizer solution 2.5 mg  2.5 mg Nebulization Q6H PRN Tamala Julian, Rondell A, MD      .  allopurinol (ZYLOPRIM) tablet 100 mg  100 mg Oral BID Fuller Plan A, MD   100 mg at 03/17/19 2220  . aspirin EC tablet 81 mg  81 mg Oral Daily Tamala Julian, Rondell A, MD   81 mg at 03/17/19 1735  . atorvastatin (LIPITOR) tablet 80 mg  80 mg Oral q1800 Fuller Plan A, MD   80 mg at 03/17/19 1734  . brimonidine (ALPHAGAN) 0.15 % ophthalmic solution 1 drop  1 drop Both Eyes BID Fuller Plan A, MD   1 drop at 03/17/19 2221  . carvedilol (COREG) tablet 12.5 mg  12.5 mg Oral BID Fuller Plan A, MD   12.5 mg at 03/17/19 2220  . Chlorhexidine Gluconate Cloth 2 % PADS 6 each  6 each Topical Q0600 Elmarie Shiley, MD   6 each at 03/18/19 [phone]  . clopidogrel (PLAVIX) tablet 75 mg  75 mg Oral Daily Tamala Julian, Rondell A, MD   75 mg at 03/17/19 1734  . ferric citrate (AURYXIA) tablet 210 mg  210 mg Oral TID WC Smith, Rondell A, MD   210 mg at 03/18/19 0858  . heparin injection 5,000 Units  5,000 Units Subcutaneous Q8H Smith, Rondell A, MD   5,000 Units at 03/18/19 0517  . hydrALAZINE (APRESOLINE) tablet 50 mg  50 mg Oral TID Norval Morton, MD   50 mg at 03/17/19 2220  . influenza vaccine adjuvanted (FLUAD) injection 0.5 mL  0.5 mL Intramuscular Tomorrow-1000 Smith, Rondell A, MD      . insulin aspart (novoLOG) injection 0-9 Units  0-9 Units Subcutaneous TID WC Norval Morton, MD   2 Units at 03/18/19 0857  . isosorbide mononitrate (IMDUR) 24 hr tablet 120 mg  120 mg Oral Daily Smith, Rondell A, MD   120 mg at 03/18/19 1057  . latanoprost (XALATAN) 0.005 % ophthalmic solution 1 drop  1 drop Both Eyes QHS Tamala Julian, Rondell A, MD   1 drop at 03/17/19 2221  . Melatonin TABS 6 mg  6 mg Oral QHS Smith, Rondell A, MD   6 mg at 03/17/19 2220  . multivitamin (RENA-VIT) tablet 1 tablet  1 tablet Oral Daily Fuller Plan A, MD   1 tablet at 03/17/19 1752  . nitroGLYCERIN (NITROSTAT) SL tablet 0.4 mg  0.4 mg Sublingual Once Lucrezia Starch, MD   Stopped  at 03/17/19 1744  . pantoprazole (PROTONIX) EC tablet 20 mg  20 mg Oral  Daily Tamala Julian, Rondell A, MD   20 mg at 03/17/19 1752  . sodium chloride flush (NS) 0.9 % injection 3 mL  3 mL Intravenous Q12H Smith, Rondell A, MD   3 mL at 03/17/19 2222  . Urelle (URELLE/URISED) 81 MG tablet 81 mg  1 tablet Oral TID Rai, Ripudeep K, MD       Labs: Basic Metabolic Panel: Recent Labs  Lab 03/17/19 1100  NA 138  K 4.3  CL 101  CO2 23  GLUCOSE 224*  BUN 19  CREATININE 2.98*  CALCIUM 8.4*   CBC: Recent Labs  Lab 03/17/19 1100  WBC 8.4  NEUTROABS 6.2  HGB 9.2*  HCT 29.4*  MCV 95.5  PLT 158   Studies/Results: Dg Chest Portable 1 View  Result Date: 03/17/2019 CLINICAL DATA:  Shortness of breath during dialysis. EXAM: PORTABLE CHEST 1 VIEW COMPARISON:  Chest radiograph dated 03/05/2019 and 02/10/2019. FINDINGS: The heart size remains enlarged. The lung bases are obscured which likely reflects small bilateral pleural effusions with associated atelectasis/airspace disease. There is mild pulmonary vascular congestion. There is no pneumothorax. A right internal jugular dialysis catheter tip overlies the right atrium, unchanged. Median sternotomy wires are unchanged. IMPRESSION: Small bilateral pleural effusions with associated atelectasis/airspace disease. Mild pulmonary vascular congestion. Electronically Signed   By: Zerita Boers M.D.   On: 03/17/2019 10:47    Dialysis Orders:  TTS at [facility] - records pending.  Assessment/Plan: 1.  Dyspnea/pulm edema: Extra HD today for volume - tells me has been meeting EDW. If that is the case, then suspect she has lost some real body weight. Goal 2-2.5L today, then again tomorrow. Per cardiology, she will take isosorbide pre-HD - will keep eye on BP. 2.  ESRD: Usual TTS schedule. Extra HD as above. 3.  Hypertension/volume: BP stable - UF as tolerated, consider lowering hydralazine dose if BP drops. 4.  Anemia: Hgb 9.2 - records pending to determine if due for ESA. 5.  Metabolic bone disease: Ca ok, Phos pending.  Continue home binders Lorin Picket) 6.  T2 DM: Per primary. 7.  CAD/angina: Cardiology consulted, recs appreciated.  Veneta Penton, PA-C 03/18/2019, 11:18 AM  [employer] Pager: [phone]

## 2019-03-18 NOTE — Progress Notes (Signed)
[facility]                                                                              Patient Demographics  Monica [name], is a 85 y.o. female, DOB - [birth year], [id number]  Admit date - 03/17/2019   Admitting Physician Norval Morton, MD  Outpatient Primary MD for the patient is Benito Mccreedy, MD  Outpatient specialists:   LOS - 0  days   Medical records reviewed and are as summarized below:    Chief Complaint  Patient presents with  . Shortness of Breath       Brief summary   Patient is a 85 year old female with HTN, HLD, ESRD on HD(TTS), CAD s/p CABG, chronic diastolic CHF, asthma, and DM type II presented from hemodialysis with shortness of breath.  She also reported feeling heavy pressure, chest tightness while dialyzing, her session was stopped approximately 45 minutes short of her regular time.  Normally reports dialyzed 3-1/2 hours.  Patient reported that over the last few weeks at home, had been complaining of shortness of breath intermittently, had required albuterol inhaler several times a day without improvement.  She also reported dysuria over the last 3 days.  En route, patient received DuoNeb breathing treatment, was placed on O2, sats improved from 90% to 100%.  No coughing, fevers or loss of consciousness.  Chest x-ray showed bilateral pleural effusion, mild pulmonary vascular congestion and bibasilar atelectasis. COVID-19 test negative  Assessment & Plan    Principal Problem:   Dyspnea, chest pain, fluid overload in the setting of ESRD on hemodialysis -At home patient was utilizing albuterol inhalers multiple times throughout the day with no significant improvement.  Patient did not complete dialysis on the day of admission, usual days TTS.  Goal dry weight 163lbs -Nephrology consulted, discussed with Dr. Posey Pronto, may need HD today, will follow recommendations -Cardiology consulted, recommended taking 60 mg of Imdur prior to  hemodialysis session -We will need home O2 evaluation, albuterol nebs (per patient request) at discharge   Active Problems: Chest pain, elevated troponin with history of CAD status post CABG  -Troponin mildly elevated 51, BNP 2492 -Patient had recently undergone cardiac cath on 02/11/2019 for NSTEMI, was noted to have chronic occlusion of LAD, LIMA to LAD patent, chronic occlusion of circumflex, chronic occlusion of mid RCA, SVG to RCA occluded.  2D echo 01/2019 showed EF of 30 to 35%, moderate concentric LVH. -Cardiology consulted, seen by Dr. Marlou Porch, felt mildly elevated high-sensitivity troponin expected in ESRD, ischemic changes seen on ECG in the inferior leads with known occlusion of RCA.  Recommended medical management, no PCI targets available, no opportunity for stent placement, recommended Imdur 60 mg prior to HD, sublingual nitro as needed  Chronic systolic and diastolic CHF -2D echo 0000000 showed EF of 30 to 35%, volume control with hemodialysis    Diabetes mellitus with renal complications (HCC) -Hemoglobin A1c on 01/08/2019 -Continue sliding scale insulin, sensitive    Gastroesophageal reflux disease -Continue PPI  Hyperlipidemia -Continue Lipitor  Anemia of chronic disease: Likely due to his ESRD -Follow CBC, hemoglobin 9.2 on 9/10  Dysuria -Complaining of dysuria for  the last 3 days, UA positive for leukocytes, nitrites, WBCs > 50, few bacteria. Urine culture less than 10,000 colonies of insignificant growth - placed on Urelle x 3 days   Code Status: Full code DVT Prophylaxis: heparin  Family Communication: Discussed all imaging results, lab results, explained to the patient and daughter at the bedside   Disposition Plan: Awaiting nephrology recommendation, may need back-to-back hemodialysis for fluid overload  Time Spent in minutes   35 minutes  Procedures:  None  Consultants:   Cardiology Nephrology  Antimicrobials:   Anti-infectives (From admission,  onward)   None          Medications  Scheduled Meds: . allopurinol  100 mg Oral BID  . aspirin EC  81 mg Oral Daily  . atorvastatin  80 mg Oral q1800  . brimonidine  1 drop Both Eyes BID  . carvedilol  12.5 mg Oral BID  . clopidogrel  75 mg Oral Daily  . ferric citrate  210 mg Oral TID WC  . heparin  5,000 Units Subcutaneous Q8H  . hydrALAZINE  50 mg Oral TID  . influenza vaccine adjuvanted  0.5 mL Intramuscular Tomorrow-1000  . insulin aspart  0-9 Units Subcutaneous TID WC  . isosorbide mononitrate  120 mg Oral Daily  . latanoprost  1 drop Both Eyes QHS  . Melatonin  6 mg Oral QHS  . multivitamin  1 tablet Oral Daily  . nitroGLYCERIN  0.4 mg Sublingual Once  . pantoprazole  20 mg Oral Daily  . sodium chloride flush  3 mL Intravenous Q12H   Continuous Infusions: PRN Meds:.acetaminophen **OR** acetaminophen, albuterol      Subjective:   Monica [name] was seen and examined today. States overnight did feel shortness of breath, was placed on O2 which helped her.  No chest pain this morning.  No fevers or chills. Patient denies dizziness, abdominal pain, N/V/D/C, new weakness, numbess, tingling.    Objective:   Vitals:   03/17/19 1830 03/17/19 2013 03/17/19 2220 03/18/19 0518  BP: (!) 142/72 (!) 153/73  (!) 125/48  Pulse: 86  82   Resp:    20  Temp:  98.8 F (37.1 C)  97.9 F (36.6 C)  TempSrc:  Oral  Oral  SpO2: 96% 100%    Weight:  74.6 kg  74.7 kg  Height:        Intake/Output Summary (Last 24 hours) at 03/18/2019 0755 Last data filed at 03/17/2019 2222 Gross per 24 hour  Intake 3 ml  Output -  Net 3 ml     Wt Readings from Last 3 Encounters:  03/18/19 74.7 kg  03/15/19 75.8 kg  02/25/19 75.8 kg     Exam  General: Alert and oriented x 3, NAD  Eyes:   HEENT:  Atraumatic, normocephalic,   Cardiovascular: S1 S2 auscultated, no murmurs, RRR  Respiratory: Decreased breath sound at the bases  Gastrointestinal: Soft, nontender, nondistended, +  bowel sounds  Ext: no pedal edema bilaterally  Neuro: No new FND's  Musculoskeletal: No digital cyanosis, clubbing  Skin: No rashes  Psych: Normal affect and demeanor, alert and oriented x3    Data Reviewed:  I have personally reviewed following labs and imaging studies  Micro Results Recent Results (from the past 240 hour(s))  SARS Coronavirus 2 [facility] order, Performed in [facility] hospital lab) Nasopharyngeal Nasopharyngeal Swab     Status: None   Collection Time: 03/17/19 12:31 PM   Specimen: Nasopharyngeal Swab  Result Value Ref Range  Status   SARS Coronavirus 2 NEGATIVE NEGATIVE Final    Comment: (NOTE) If result is NEGATIVE SARS-CoV-2 target nucleic acids are NOT DETECTED. The SARS-CoV-2 RNA is generally detectable in upper and lower  respiratory specimens during the acute phase of infection. The lowest  concentration of SARS-CoV-2 viral copies this assay can detect is 250  copies / mL. A negative result does not preclude SARS-CoV-2 infection  and should not be used as the sole basis for treatment or other  patient management decisions.  A negative result may occur with  improper specimen collection / handling, submission of specimen other  than nasopharyngeal swab, presence of viral mutation(s) within the  areas targeted by this assay, and inadequate number of viral copies  (<250 copies / mL). A negative result must be combined with clinical  observations, patient history, and epidemiological information. If result is POSITIVE SARS-CoV-2 target nucleic acids are DETECTED. The SARS-CoV-2 RNA is generally detectable in upper and lower  respiratory specimens dur ing the acute phase of infection.  Positive  results are indicative of active infection with SARS-CoV-2.  Clinical  correlation with patient history and other diagnostic information is  necessary to determine patient infection status.  Positive results do  not rule out bacterial infection or co-infection  with other viruses. If result is PRESUMPTIVE POSTIVE SARS-CoV-2 nucleic acids MAY BE PRESENT.   A presumptive positive result was obtained on the submitted specimen  and confirmed on repeat testing.  While 2019 novel coronavirus  (SARS-CoV-2) nucleic acids may be present in the submitted sample  additional confirmatory testing may be necessary for epidemiological  and / or clinical management purposes  to differentiate between  SARS-CoV-2 and other Sarbecovirus currently known to infect humans.  If clinically indicated additional testing with an alternate test  methodology [phone]) is advised. The SARS-CoV-2 RNA is generally  detectable in upper and lower respiratory sp ecimens during the acute  phase of infection. The expected result is Negative. Fact Sheet for Patients:  StrictlyIdeas.no Fact Sheet for Healthcare Providers: BankingDealers.co.za This test is not yet approved or cleared by the [place] FDA and has been authorized for detection and/or diagnosis of SARS-CoV-2 by FDA under an Emergency Use Authorization (EUA).  This EUA will remain in effect (meaning this test can be used) for the duration of the COVID-19 declaration under Section 564(b)(1) of the Act, 21 U.S.C. section 360bbb-3(b)(1), unless the authorization is terminated or revoked sooner. Performed at [facility], [place] [address] [place], [place] [address]     Radiology Reports Dg Chest Portable 1 View  Result Date: 03/17/2019 CLINICAL DATA:  Shortness of breath during dialysis. EXAM: PORTABLE CHEST 1 VIEW COMPARISON:  Chest radiograph dated 03/05/2019 and 02/10/2019. FINDINGS: The heart size remains enlarged. The lung bases are obscured which likely reflects small bilateral pleural effusions with associated atelectasis/airspace disease. There is mild pulmonary vascular congestion. There is no pneumothorax. A right internal jugular dialysis catheter tip  overlies the right atrium, unchanged. Median sternotomy wires are unchanged. IMPRESSION: Small bilateral pleural effusions with associated atelectasis/airspace disease. Mild pulmonary vascular congestion. Electronically Signed   By: Zerita Boers M.D.   On: 03/17/2019 10:47    Lab Data:  CBC: Recent Labs  Lab 03/17/19 1100  WBC 8.4  NEUTROABS 6.2  HGB 9.2*  HCT 29.4*  MCV 95.5  PLT 0000000   Basic Metabolic Panel: Recent Labs  Lab 03/17/19 1100  NA 138  K 4.3  CL 101  CO2 23  GLUCOSE 224*  BUN 19  CREATININE 2.98*  CALCIUM 8.4*   GFR: Estimated Creatinine Clearance: 12.8 mL/min (A) (by C-G formula based on SCr of 2.98 mg/dL (H)). Liver Function Tests: No results for input(s): AST, ALT, ALKPHOS, BILITOT, PROT, ALBUMIN in the last 168 hours. No results for input(s): LIPASE, AMYLASE in the last 168 hours. No results for input(s): AMMONIA in the last 168 hours. Coagulation Profile: No results for input(s): INR, PROTIME in the last 168 hours. Cardiac Enzymes: No results for input(s): CKTOTAL, CKMB, CKMBINDEX, TROPONINI in the last 168 hours. BNP (last 3 results) No results for input(s): PROBNP in the last 8760 hours. HbA1C: No results for input(s): HGBA1C in the last 72 hours. CBG: Recent Labs  Lab 03/17/19 2153 03/18/19 0749  GLUCAP 176* 159*   Lipid Profile: No results for input(s): CHOL, HDL, LDLCALC, TRIG, CHOLHDL, LDLDIRECT in the last 72 hours. Thyroid Function Tests: No results for input(s): TSH, T4TOTAL, FREET4, T3FREE, THYROIDAB in the last 72 hours. Anemia Panel: No results for input(s): VITAMINB12, FOLATE, FERRITIN, TIBC, IRON, RETICCTPCT in the last 72 hours. Urine analysis:    Component Value Date/Time   COLORURINE YELLOW 01/17/2019 1920   APPEARANCEUR CLOUDY (A) 01/17/2019 1920   APPEARANCEUR Hazy 07/09/2012 0108   LABSPEC 1.010 01/17/2019 1920   LABSPEC 1.010 07/09/2012 0108   PHURINE 7.5 01/17/2019 1920   GLUCOSEU NEGATIVE 01/17/2019 1920    GLUCOSEU >=500 07/09/2012 0108   HGBUR MODERATE (A) 01/17/2019 1920   BILIRUBINUR NEGATIVE 01/17/2019 1920   BILIRUBINUR Negative 07/09/2012 0108   KETONESUR NEGATIVE 01/17/2019 1920   PROTEINUR 100 (A) 01/17/2019 1920   UROBILINOGEN 0.2 11/22/2012 1809   NITRITE POSITIVE (A) 01/17/2019 1920   LEUKOCYTESUR LARGE (A) 01/17/2019 1920   LEUKOCYTESUR 1+ 07/09/2012 0108       M.D. [facility] 03/18/2019, 7:55 AM  Pager: [phone] Between 7am to 7pm - call Pager - [phone]  After 7pm go to [email or web address] - password [email or web address]  Call night coverage person covering after 7pm

## 2019-03-18 NOTE — Progress Notes (Addendum)
Progress Note  Patient Name: Monica [name] Date of Encounter: 03/18/2019  Primary Cardiologist:  Ena Dawley, MD  Subjective   Still SOB but better, CP has resolved.   Inpatient Medications    Scheduled Meds: . allopurinol  100 mg Oral BID  . aspirin EC  81 mg Oral Daily  . atorvastatin  80 mg Oral q1800  . brimonidine  1 drop Both Eyes BID  . carvedilol  12.5 mg Oral BID  . clopidogrel  75 mg Oral Daily  . ferric citrate  210 mg Oral TID WC  . heparin  5,000 Units Subcutaneous Q8H  . hydrALAZINE  50 mg Oral TID  . influenza vaccine adjuvanted  0.5 mL Intramuscular Tomorrow-1000  . insulin aspart  0-9 Units Subcutaneous TID WC  . isosorbide mononitrate  120 mg Oral Daily  . latanoprost  1 drop Both Eyes QHS  . Melatonin  6 mg Oral QHS  . multivitamin  1 tablet Oral Daily  . nitroGLYCERIN  0.4 mg Sublingual Once  . pantoprazole  20 mg Oral Daily  . sodium chloride flush  3 mL Intravenous Q12H   Continuous Infusions:  PRN Meds: acetaminophen **OR** acetaminophen, albuterol   Vital Signs    Vitals:   03/17/19 1830 03/17/19 2013 03/17/19 2220 03/18/19 0518  BP: (!) 142/72 (!) 153/73  (!) 125/48  Pulse: 86  82   Resp:    20  Temp:  98.8 F (37.1 C)  97.9 F (36.6 C)  TempSrc:  Oral  Oral  SpO2: 96% 100%    Weight:  74.6 kg  74.7 kg  Height:        Intake/Output Summary (Last 24 hours) at 03/18/2019 0741 Last data filed at 03/17/2019 2222 Gross per 24 hour  Intake 3 ml  Output -  Net 3 ml   Filed Weights   03/17/19 1025 03/17/19 2013 03/18/19 0518  Weight: 74.4 kg 74.6 kg 74.7 kg   Last Weight  Most recent update: 03/18/2019  5:28 AM   Weight  74.7 kg (164 lb 10.9 oz)           Weight change:    Telemetry    SR, no ectopy - Personally Reviewed  ECG    09/11 ECG is SR, HR 79, Inferior ST changes have resolved. Still w/ mild ST depression lateral leads - Personally Reviewed  Physical Exam   General: Well developed, well nourished, female  appearing in no acute distress. Head: Normocephalic, atraumatic.  Neck: Supple without bruits, JVD not seen elevated. Lungs:  Resp regular and unlabored, rales bases Heart: RRR, S1, S2, no S3, S4, or murmur; no rub. Abdomen: Soft, non-tender, non-distended with normoactive bowel sounds. No hepatomegaly. No rebound/guarding. No obvious abdominal masses. Extremities: No clubbing, cyanosis, no edema. Distal pedal pulses are 2+ bilaterally. Neuro: Alert and oriented X 3. Moves all extremities spontaneously. Psych: Normal affect.  Labs    Hematology Recent Labs  Lab 03/17/19 1100  WBC 8.4  RBC 3.08*  HGB 9.2*  HCT 29.4*  MCV 95.5  MCH 29.9  MCHC 31.3  RDW 15.4  PLT 158    Chemistry Recent Labs  Lab 03/17/19 1100  NA 138  K 4.3  CL 101  CO2 23  GLUCOSE 224*  BUN 19  CREATININE 2.98*  CALCIUM 8.4*  GFRNONAA 14*  GFRAA 16*  ANIONGAP 14     High Sensitivity Troponin:   Recent Labs  Lab 03/17/19 1100 03/17/19 1320  TROPONINIHS 27* 51*  BNP Recent Labs  Lab 03/17/19 1630  BNP 2,492.2*     Radiology    Dg Chest Portable 1 View  Result Date: 03/17/2019 CLINICAL DATA:  Shortness of breath during dialysis. EXAM: PORTABLE CHEST 1 VIEW COMPARISON:  Chest radiograph dated 03/05/2019 and 02/10/2019. FINDINGS: The heart size remains enlarged. The lung bases are obscured which likely reflects small bilateral pleural effusions with associated atelectasis/airspace disease. There is mild pulmonary vascular congestion. There is no pneumothorax. A right internal jugular dialysis catheter tip overlies the right atrium, unchanged. Median sternotomy wires are unchanged. IMPRESSION: Small bilateral pleural effusions with associated atelectasis/airspace disease. Mild pulmonary vascular congestion. Electronically Signed   By: Zerita Boers M.D.   On: 03/17/2019 10:47   Cardiac Studies   None this admit  Patient Profile     85 y.o. female w/ hx CABG 2017,NSTEMI7/2020 with  PCI,chronic combined CHF, moderate MR, anemia, bradycardia/sinus node dysfunction (tolerates Coreg), ESRD on HDTThS,GERD, DM2, HTN, HLD, who was admitted from HD 09/10 for CP/SOB.   Assessment & Plan    1. Chest pain:  - HS Trop mildly elevated but low, no c/w ACS - inferior ST changes have resolved - suspect demand ischemia in the setting of SOB - would continue current meds, we will refill SL NTG, sent to [facility] - take 1/2 dose Imdur am of HD, take other half after - if she goes to HD w/ nitrates on board, they may be able to get her dryer - continue other rx as pta  2. SOB - some volume overload by exam last pm, improved but not resolved - BNP elevated and CXR w/ some fluid - symptomatically improved overnight (no additional HD) - suspect it takes time for her to equilibrate after intravascular volume depletion, that may stress her heart and keep her breathing short after HD (straw)  Otherwise, per IM. No further cardiac workup planned. Principal Problem:   Dyspnea Active Problems:   Diabetes mellitus with renal complications (HCC)   Gastroesophageal reflux disease   Chest pain   Prolonged Q-T interval on ECG   Angina pectoris (HCC)   Chronic combined systolic and diastolic CHF (congestive heart failure) (HCC)   Anemia of chronic disease   ESRD on hemodialysis ([place])  Signed, Rosaria Ferries , PA-C 7:41 AM 03/18/2019 Pager: [phone]  Personally seen and examined. Agree with above. Feels better  Alert  Anginal symptoms in the setting of severe coronary artery disease not amenable to further intervention or PCI. - Thoughts were to try isosorbide 60 mg prior to dialysis sessions.  Hopefully she will be able to tolerate this and this will help her alleviate some of the chest discomfort she has been experiencing during the volume shifts of dialysis.  Shortness of breath - This likely relates to excess volume in combination with anginal equivalent.  Isosorbide,  nitroglycerin.  Try to decrease dry weight if at all possible.  Please let us know if we can be of further assistance.  No further recommendations at this time.  We will sign off.  Candee Furbish, MD

## 2019-03-19 DIAGNOSIS — Z23 Encounter for immunization: Secondary | ICD-10-CM | POA: Diagnosis not present

## 2019-03-19 DIAGNOSIS — R0602 Shortness of breath: Secondary | ICD-10-CM

## 2019-03-19 LAB — BASIC METABOLIC PANEL
Anion gap: 12 (ref 5–15)
BUN: 11 mg/dL (ref 8–23)
CO2: 26 mmol/L (ref 22–32)
Calcium: 8.5 mg/dL — ABNORMAL LOW (ref 8.9–10.3)
Chloride: 98 mmol/L (ref 98–111)
Creatinine, Ser: 2.71 mg/dL — ABNORMAL HIGH (ref 0.44–1.00)
GFR calc Af Amer: 18 mL/min — ABNORMAL LOW (ref >60)
GFR calc non Af Amer: 15 mL/min — ABNORMAL LOW (ref >60)
Glucose, Bld: 184 mg/dL — ABNORMAL HIGH (ref 70–99)
Potassium: 3.7 mmol/L (ref 3.5–5.1)
Sodium: 136 mmol/L (ref 135–145)

## 2019-03-19 LAB — RENAL FUNCTION PANEL
Albumin: 3.2 g/dL — ABNORMAL LOW (ref 3.5–5.0)
Anion gap: 13 (ref 5–15)
BUN: 28 mg/dL — ABNORMAL HIGH (ref 8–23)
CO2: 25 mmol/L (ref 22–32)
Calcium: 8.8 mg/dL — ABNORMAL LOW (ref 8.9–10.3)
Chloride: 102 mmol/L (ref 98–111)
Creatinine, Ser: 4.6 mg/dL — ABNORMAL HIGH (ref 0.44–1.00)
GFR calc Af Amer: 9 mL/min — ABNORMAL LOW (ref >60)
GFR calc non Af Amer: 8 mL/min — ABNORMAL LOW (ref >60)
Glucose, Bld: 129 mg/dL — ABNORMAL HIGH (ref 70–99)
Phosphorus: 3.4 mg/dL (ref 2.5–4.6)
Potassium: 4.3 mmol/L (ref 3.5–5.1)
Sodium: 140 mmol/L (ref 135–145)

## 2019-03-19 LAB — CBC
HCT: 27.8 % — ABNORMAL LOW (ref 36.0–46.0)
Hemoglobin: 9 g/dL — ABNORMAL LOW (ref 12.0–15.0)
MCH: 30 pg (ref 26.0–34.0)
MCHC: 32.4 g/dL (ref 30.0–36.0)
MCV: 92.7 fL (ref 80.0–100.0)
Platelets: 195 10*3/uL (ref 150–400)
RBC: 3 MIL/uL — ABNORMAL LOW (ref 3.87–5.11)
RDW: 15.2 % (ref 11.5–15.5)
WBC: 6.4 10*3/uL (ref 4.0–10.5)
nRBC: 0 % (ref 0.0–0.2)

## 2019-03-19 LAB — GLUCOSE, CAPILLARY: Glucose-Capillary: 108 mg/dL — ABNORMAL HIGH (ref 70–99)

## 2019-03-19 MED ORDER — ISOSORBIDE MONONITRATE ER 60 MG PO TB24: ORAL_TABLET | ORAL | 0 refills | Status: DC

## 2019-03-19 MED ORDER — ISOSORBIDE MONONITRATE ER 60 MG PO TB24
60.0000 mg | ORAL_TABLET | Freq: Once | ORAL | Status: AC
  Administered 2019-03-19: 13:00:00 60 mg via ORAL

## 2019-03-19 MED ORDER — DOXERCALCIFEROL 4 MCG/2ML IV SOLN
INTRAVENOUS | Status: AC
  Filled 2019-03-19: qty 2

## 2019-03-19 MED ORDER — URELLE 81 MG PO TABS: 1.0000 | ORAL_TABLET | Freq: Three times a day (TID) | ORAL | 0 refills | Status: DC

## 2019-03-19 MED ORDER — ISOSORBIDE MONONITRATE ER 60 MG PO TB24: 120.0000 mg | ORAL_TABLET | Freq: Every day | ORAL | 0 refills | Status: DC

## 2019-03-19 MED ORDER — DOXERCALCIFEROL 4 MCG/2ML IV SOLN
1.0000 ug | INTRAVENOUS | Status: DC
  Administered 2019-03-19: 09:00:00 1 ug via INTRAVENOUS

## 2019-03-19 NOTE — Discharge Summary (Signed)
Physician Discharge Summary  Monica [name] [id number] DOB: [birth year] DOA: 03/17/2019  PCP: Benito Mccreedy, MD  Admit date: 03/17/2019 Discharge date: 03/19/2019  Admitted From: Home  Disposition: Home   Recommendations for Outpatient Follow-up:  1. Follow up with PCP in 1-2 weeks 2. Please obtain BMP/CBC in one week 3. Needs to follow up with cardiology for further care CAD  Home Echo nurse Equipment/Devices: DME nebulizer  Discharge Condition: Stable.  CODE STATUS: full code Diet recommendation: Heart Healthy   Brief/Interim Summary: Patient is a 85 year old female with HTN, HLD, ESRD on HD(TTS), CADs/p CABG, chronic diastolic CHF,asthma,and DM type II presented from hemodialysis with shortness of breath.  She also reported feeling heavy pressure, chest tightness while dialyzing, her session was stopped approximately 45 minutes short of her regular time.  Normally reports dialyzed 3-1/2 hours.  Patient reported that over the last few weeks at home, had been complaining of shortness of breath intermittently, had required albuterol inhaler several times a day without improvement.  She also reported dysuria over the last 3 days.  En route, patient received DuoNeb breathing treatment, was placed on O2, sats improved from 90% to 100%.  No coughing, fevers or loss of consciousness.  Chest x-ray showed bilateral pleural effusion, mild pulmonary vascular congestion and bibasilar atelectasis. COVID-19 test negative   Chest pain, elevated troponin with history of CAD status post CABG  -Troponin mildly elevated 51, BNP 2492 -Patient had recently undergone cardiac cath on 02/11/2019 for NSTEMI, was noted to have chronic occlusion of LAD, LIMA to LAD patent, chronic occlusion of circumflex, chronic occlusion of mid RCA, SVG to RCA occluded.  2D echo 01/2019 showed EF of 30 to 35%, moderate concentric LVH. -Cardiology consulted, seen by Dr. Marlou Porch, felt mildly elevated high-sensitivity  troponin expected in ESRD, ischemic changes seen on ECG in the inferior leads with known occlusion of RCA.  Recommended medical management, no PCI targets available, no opportunity for stent placement, recommended Imdur 60 mg prior to HD, sublingual nitro as needed. Chest pain resolved. Tolerated 60 mg Imdur prior to HD, to take another 60 after HD>   Acute on Chronic systolic and diastolic CHF -2D echo 0000000 showed EF of 30 to 35%, volume control with hemodialysis nephrologist removing more fluids during HD. She hd HD, Friday and Saturday.     Diabetes mellitus with renal complications (HCC) -Hemoglobin A1c on 01/08/2019 -Continue sliding scale insulin, sensitive    Gastroesophageal reflux disease -Continue PPI  Hyperlipidemia -Continue Lipitor  Anemia of chronic disease: Likely due to his ESRD -Follow CBC, hemoglobin 9.2 on 9/10  Dysuria -Complaining of dysuria for the last 3 days, UA positive for leukocytes, nitrites, WBCs > 50, few bacteria. Urine culture less than 10,000 colonies of insignificant growth - placed on Urelle x 3 days    Discharge Diagnoses:  Principal Problem:   Dyspnea Active Problems:   Diabetes mellitus with renal complications (HCC)   Gastroesophageal reflux disease   Chest pain   Prolonged Q-T interval on ECG   Angina pectoris (HCC)   Chronic combined systolic and diastolic CHF (congestive heart failure) (HCC)   Anemia of chronic disease   ESRD on hemodialysis (HCC)   Acute on chronic combined systolic and diastolic CHF (congestive heart failure) ([place])    Discharge Instructions  Discharge Instructions    Diet - low sodium heart healthy   Complete by: As directed    Increase activity slowly   Complete by: As directed      Allergies as  of 03/19/2019   No Known Allergies     Medication List    TAKE these medications   acetaminophen 500 MG tablet Commonly known as: TYLENOL Take 1 tablet (500 mg total) by mouth every 6 (six) hours as  needed for mild pain or headache.   albuterol 108 (90 Base) MCG/ACT inhaler Commonly known as: VENTOLIN HFA Inhale 2 puffs into the lungs every 6 (six) hours as needed for wheezing or shortness of breath.   allopurinol 100 MG tablet Commonly known as: ZYLOPRIM Take 100 mg by mouth 2 (two) times daily.   Alphagan P 0.1 % Soln Generic drug: brimonidine Place 1 drop into both eyes 2 (two) times daily.   aspirin 81 MG EC tablet Take 1 tablet (81 mg total) by mouth daily.   atorvastatin 80 MG tablet Commonly known as: LIPITOR Take 80 mg by mouth daily at 6 PM.   carvedilol 12.5 MG tablet Commonly known as: COREG Take 1 tablet (12.5 mg total) by mouth 2 (two) times daily.   clopidogrel 75 MG tablet Commonly known as: PLAVIX Take 75 mg by mouth daily.   diphenhydrAMINE 25 MG tablet Commonly known as: BENADRYL Take 25 mg by mouth as needed for itching or allergies.   ferric citrate 1 GM 210 MG(Fe) tablet Commonly known as: AURYXIA Take 210 mg by mouth 3 (three) times daily with meals.   hydrALAZINE 25 MG tablet Commonly known as: APRESOLINE Take 2 tablets (50 mg total) by mouth 3 (three) times daily.   isosorbide mononitrate 60 MG 24 hr tablet Commonly known as: IMDUR Take 120 mg on Monday, Wednesday, Friday and Sunday.   Take 60 mg on (Tuesday, Thursday and Saturday)  Prior  to Dialysis and 60 mg After dialysis/ What changed:   how much to take  how to take this  when to take this  additional instructions   latanoprost 0.005 % ophthalmic solution Commonly known as: XALATAN Place 1 drop into both eyes at bedtime.   loratadine 10 MG tablet Commonly known as: CLARITIN Take 10 mg by mouth as needed for allergies.   Melatonin 5 MG Caps Take 5 mg by mouth at bedtime.   multivitamin Tabs tablet Take 1 tablet by mouth daily.   nitroGLYCERIN 0.4 MG SL tablet Commonly known as: NITROSTAT Place 1 tablet (0.4 mg total) under the tongue every 5 (five) minutes as  needed for chest pain.   pantoprazole 20 MG tablet Commonly known as: PROTONIX Take 20 mg by mouth daily.   Urelle 81 MG Tabs tablet Take 1 tablet (81 mg total) by mouth 3 (three) times daily.   Vitamin D-3 25 MCG (1000 UT) Caps Take 1,000 Units by mouth daily.            Durable Medical Equipment  (From admission, onward)         Start     Ordered   03/19/19 1017  For home use only DME Nebulizer machine  Once    Question Answer Comment  Patient needs a nebulizer to treat with the following condition Asthma   Length of Need 6 Months      09 /12/20 1016         Follow-up Information    Osei-Bonsu, Iona Beard, MD Follow up in 1 week(s).   Specialty: Internal Medicine Contact information: [id number] [place] [address] [phone]        Dorothy Spark, MD .   Specialty: Cardiology Contact information: [phone] [place] [address] [phone]          No Known Allergies  Consultations:  Cardiology  Nephrology   Procedures/Studies: Dg Chest Portable 1 View  Result Date: 03/17/2019 CLINICAL DATA:  Shortness of breath during dialysis. EXAM: PORTABLE CHEST 1 VIEW COMPARISON:  Chest radiograph dated 03/05/2019 and 02/10/2019. FINDINGS: The heart size remains enlarged. The lung bases are obscured which likely reflects small bilateral pleural effusions with associated atelectasis/airspace disease. There is mild pulmonary vascular congestion. There is no pneumothorax. A right internal jugular dialysis catheter tip overlies the right atrium, unchanged. Median sternotomy wires are unchanged. IMPRESSION: Small bilateral pleural effusions with associated atelectasis/airspace disease. Mild pulmonary vascular congestion. Electronically Signed   By: Zerita Boers M.D.   On: 03/17/2019 10:47    Subjective: Patient is alert and oriented , seen in HD. She is feeling well, denies chest pain or dyspnea.   Discharge  Exam: Vitals:   03/19/19 1000 03/19/19 1031  BP: 136/79 135/62  Pulse: 87 84  Resp:    Temp:    SpO2:       General: Pt is alert, awake, not in acute distress Cardiovascular: RRR, S1/S2 +, no rubs, no gallops Respiratory: CTA bilaterally, no wheezing, no rhonchi Abdominal: Soft, NT, ND, bowel sounds + Extremities: no edema, no cyanosis    The results of significant diagnostics from this hospitalization (including imaging, microbiology, ancillary and laboratory) are listed below for reference.     Microbiology: Recent Results (from the past 240 hour(s))  SARS Coronavirus 2 [facility] order, Performed in [facility] hospital lab) Nasopharyngeal Nasopharyngeal Swab     Status: None   Collection Time: 03/17/19 12:31 PM   Specimen: Nasopharyngeal Swab  Result Value Ref Range Status   SARS Coronavirus 2 NEGATIVE NEGATIVE Final    Comment: (NOTE) If result is NEGATIVE SARS-CoV-2 target nucleic acids are NOT DETECTED. The SARS-CoV-2 RNA is generally detectable in upper and lower  respiratory specimens during the acute phase of infection. The lowest  concentration of SARS-CoV-2 viral copies this assay can detect is 250  copies / mL. A negative result does not preclude SARS-CoV-2 infection  and should not be used as the sole basis for treatment or other  patient management decisions.  A negative result may occur with  improper specimen collection / handling, submission of specimen other  than nasopharyngeal swab, presence of viral mutation(s) within the  areas targeted by this assay, and inadequate number of viral copies  (<250 copies / mL). A negative result must be combined with clinical  observations, patient history, and epidemiological information. If result is POSITIVE SARS-CoV-2 target nucleic acids are DETECTED. The SARS-CoV-2 RNA is generally detectable in upper and lower  respiratory specimens dur ing the acute phase of infection.  Positive  results are indicative of  active infection with SARS-CoV-2.  Clinical  correlation with patient history and other diagnostic information is  necessary to determine patient infection status.  Positive results do  not rule out bacterial infection or co-infection with other viruses. If result is PRESUMPTIVE POSTIVE SARS-CoV-2 nucleic acids MAY BE PRESENT.   A presumptive positive result was obtained on the submitted specimen  and confirmed on repeat testing.  While 2019 novel coronavirus  (SARS-CoV-2) nucleic acids may be present in the submitted sample  additional confirmatory testing may be necessary for epidemiological  and / or clinical management purposes  to differentiate between  SARS-CoV-2 and other Sarbecovirus currently known to infect humans.  If clinically indicated additional testing with an alternate test  methodology [phone]) is advised. The SARS-CoV-2 RNA is generally  detectable in upper and lower respiratory sp ecimens during the acute  phase of infection. The expected result is Negative. Fact Sheet for Patients:  StrictlyIdeas.no Fact Sheet for Healthcare Providers: BankingDealers.co.za This test is not yet approved or cleared by the [place] FDA and has been authorized for detection and/or diagnosis of SARS-CoV-2 by FDA under an Emergency Use Authorization (EUA).  This EUA will remain in effect (meaning this test can be used) for the duration of the COVID-19 declaration under Section 564(b)(1) of the Act, 21 U.S.C. section 360bbb-3(b)(1), unless the authorization is terminated or revoked sooner. Performed at [facility], [place] [address], [place],  [address]      Labs: BNP (last 3 results) Recent Labs    01/07/19 1850 01/17/19 2133 03/17/19 1630  BNP 3,534.3* 1,643.2* 0000000*   Basic Metabolic Panel: Recent Labs  Lab 03/17/19 1100 03/18/19 1810 03/19/19 0422  NA 138 140 136  K 4.3 4.3 3.7  CL 101 102 98  CO2 23  25 26   GLUCOSE 224* 129* 184*  BUN 19 28* 11  CREATININE 2.98* 4.60* 2.71*  CALCIUM 8.4* 8.8* 8.5*  PHOS  --  3.4  --    Liver Function Tests: Recent Labs  Lab 03/18/19 1810  ALBUMIN 3.2*   No results for input(s): LIPASE, AMYLASE in the last 168 hours. No results for input(s): AMMONIA in the last 168 hours. CBC: Recent Labs  Lab 03/17/19 1100 03/18/19 1810 03/19/19 0422  WBC 8.4 6.6 6.4  NEUTROABS 6.2  --   --   HGB 9.2* 8.4* 9.0*  HCT 29.4* 26.7* 27.8*  MCV 95.5 94.7 92.7  PLT 158 192 195   Cardiac Enzymes: No results for input(s): CKTOTAL, CKMB, CKMBINDEX, TROPONINI in the last 168 hours. BNP: Invalid input(s): POCBNP CBG: Recent Labs  Lab 03/17/19 2153 03/18/19 0749 03/18/19 1129 03/18/19 1605 03/18/19 2229  GLUCAP 176* 159* 243* 103* 98   D-Dimer No results for input(s): DDIMER in the last 72 hours. Hgb A1c No results for input(s): HGBA1C in the last 72 hours. Lipid Profile No results for input(s): CHOL, HDL, LDLCALC, TRIG, CHOLHDL, LDLDIRECT in the last 72 hours. Thyroid function studies No results for input(s): TSH, T4TOTAL, T3FREE, THYROIDAB in the last 72 hours.  Invalid input(s): FREET3 Anemia work up No results for input(s): VITAMINB12, FOLATE, FERRITIN, TIBC, IRON, RETICCTPCT in the last 72 hours. Urinalysis    Component Value Date/Time   COLORURINE YELLOW 01/17/2019 1920   APPEARANCEUR CLOUDY (A) 01/17/2019 1920   APPEARANCEUR Hazy 07/09/2012 0108   LABSPEC 1.010 01/17/2019 1920   LABSPEC 1.010 07/09/2012 0108   PHURINE 7.5 01/17/2019 1920   GLUCOSEU NEGATIVE 01/17/2019 1920   GLUCOSEU >=500 07/09/2012 0108   HGBUR MODERATE (A) 01/17/2019 1920   BILIRUBINUR NEGATIVE 01/17/2019 1920   BILIRUBINUR Negative 07/09/2012 0108   KETONESUR NEGATIVE 01/17/2019 1920   PROTEINUR 100 (A) 01/17/2019 1920   UROBILINOGEN 0.2 11/22/2012 1809   NITRITE POSITIVE (A) 01/17/2019 1920   LEUKOCYTESUR LARGE (A) 01/17/2019 1920   LEUKOCYTESUR 1+  07/09/2012 0108   Sepsis Labs Invalid input(s): PROCALCITONIN,  WBC,  LACTICIDVEN Microbiology Recent Results (from the past 240 hour(s))  SARS Coronavirus 2 [facility] order, Performed in [facility] hospital lab) Nasopharyngeal Nasopharyngeal Swab     Status: None   Collection Time: 03/17/19 12:31 PM   Specimen: Nasopharyngeal Swab  Result Value  Ref Range Status   SARS Coronavirus 2 NEGATIVE NEGATIVE Final    Comment: (NOTE) If result is NEGATIVE SARS-CoV-2 target nucleic acids are NOT DETECTED. The SARS-CoV-2 RNA is generally detectable in upper and lower  respiratory specimens during the acute phase of infection. The lowest  concentration of SARS-CoV-2 viral copies this assay can detect is 250  copies / mL. A negative result does not preclude SARS-CoV-2 infection  and should not be used as the sole basis for treatment or other  patient management decisions.  A negative result may occur with  improper specimen collection / handling, submission of specimen other  than nasopharyngeal swab, presence of viral mutation(s) within the  areas targeted by this assay, and inadequate number of viral copies  (<250 copies / mL). A negative result must be combined with clinical  observations, patient history, and epidemiological information. If result is POSITIVE SARS-CoV-2 target nucleic acids are DETECTED. The SARS-CoV-2 RNA is generally detectable in upper and lower  respiratory specimens dur ing the acute phase of infection.  Positive  results are indicative of active infection with SARS-CoV-2.  Clinical  correlation with patient history and other diagnostic information is  necessary to determine patient infection status.  Positive results do  not rule out bacterial infection or co-infection with other viruses. If result is PRESUMPTIVE POSTIVE SARS-CoV-2 nucleic acids MAY BE PRESENT.   A presumptive positive result was obtained on the submitted specimen  and confirmed on repeat testing.   While 2019 novel coronavirus  (SARS-CoV-2) nucleic acids may be present in the submitted sample  additional confirmatory testing may be necessary for epidemiological  and / or clinical management purposes  to differentiate between  SARS-CoV-2 and other Sarbecovirus currently known to infect humans.  If clinically indicated additional testing with an alternate test  methodology [phone]) is advised. The SARS-CoV-2 RNA is generally  detectable in upper and lower respiratory sp ecimens during the acute  phase of infection. The expected result is Negative. Fact Sheet for Patients:  StrictlyIdeas.no Fact Sheet for Healthcare Providers: BankingDealers.co.za This test is not yet approved or cleared by the [place] FDA and has been authorized for detection and/or diagnosis of SARS-CoV-2 by FDA under an Emergency Use Authorization (EUA).  This EUA will remain in effect (meaning this test can be used) for the duration of the COVID-19 declaration under Section 564(b)(1) of the Act, 21 U.S.C. section 360bbb-3(b)(1), unless the authorization is terminated or revoked sooner. Performed at [facility], [place] [address], [place], [place] [address]      Time coordinating discharge: 40 minutes  SIGNED:   Elmarie Shiley, MD  [facility]

## 2019-03-19 NOTE — Progress Notes (Signed)
Pt's daughter asked about target dry weight for patient when she goes to outpt dialysis. RN related msg to Posey Pronto, MD Nephrology, MD stated he will put it in a progress note and can print off for patient to take home/ outpt dialysis center.

## 2019-03-19 NOTE — Progress Notes (Signed)
Patient ID: Monica [name], female   DOB: 02/24/1931, 85 y.o.   MRN: [id number]  Post dialysis weight today was 152lb which is 68.9Kg. I recommend this be used as a starting EDW upon returning to the OP HD unit.   Elmarie Shiley MD [facility]. Office # [phone] Pager # [phone] 1:21 PM

## 2019-03-19 NOTE — TOC Transition Note (Addendum)
Transition of Care [facility]) - CM/SW Discharge Note   Patient Details  Name: Monica [name] MRN: [id number] Date of Birth: 01-21-1931  Transition of Care [facility]) CM/SW Contact:  Zenon Mayo, RN Phone Number: 03/19/2019, 11:00 AM   Clinical Narrative:    From home alone, daughter in room, states she would like [facility] for patient when she goes home and she does not have a preference of which agency.  She will transport patient home.  She states she wants to speak with MD this am , she thinks patient needs a neb machine.  NCM notified MD of this information. Will need HHRN order and neb machine order.  NCM received confirmation from [facility] with [facility] they can take referral for [facility].  NCM made referral to [place] with [facility] for neb machine also. Bertrum Sol will bring up to room prior to dc.  Also informed daughter , [name] soc for [facility] is Earnest Bailey states this is fine.   Final next level of care: [place] Barriers to Discharge: No Barriers Identified   Patient Goals and CMS Choice Patient states their goals for this hospitalization and ongoing recovery are:: get better CMS Medicare.gov Compare Post Acute Care list provided to:: Patient Represenative (must comment)(daughter) Choice offered to / list presented to : Adult Children  Discharge Placement                       Discharge Plan and Services In-house Referral: NA Discharge Planning Services: CM Consult Post Acute Care Choice: Home Health          DME Arranged: Nebulizer machine DME Agency: [employer] Date DME Agency Contacted: 03/19/19 Time DME Agency Contacted: 50 Representative spoke with at DME Agency: Bertrum Sol HH Arranged: RN, Disease Management [place] Agency: [facility] [employer] Date [place]: 03/19/19 Time [place]: 1100 Representative spoke with at [place]: [place] ([place]) Interventions     Readmission Risk  Interventions Readmission Risk Prevention Plan 03/19/2019  Transportation Screening Complete  Medication Review Press photographer) Complete  PCP or Specialist appointment within 3-5 days of discharge Complete  HRI or [place] Complete  SW Recovery Care/Counseling Consult Complete  [place] Not Applicable  Some recent data might be hidden

## 2019-03-19 NOTE — Procedures (Signed)
Patient seen on Hemodialysis. BP 136/79 (BP Location: Right Arm)   Pulse 87   Temp 98.6 F (37 C) (Oral)   Resp 18   Ht 5\' 2"  (1.575 m)   Wt 71.8 kg   SpO2 98%   BMI 28.95 kg/m   QB 350, UF goal 2L Tolerating treatment without complaints at this time.   Elmarie Shiley MD [facility]. Office # [phone] Pager # [phone] 10:15 AM

## 2019-03-19 NOTE — TOC Initial Note (Addendum)
Transition of Care [facility]) - Initial/Assessment Note    Patient Details  Name: Monica [name] MRN: [id number] Date of Birth: 03-May-1931  Transition of Care [facility]) CM/SW Contact:    Zenon Mayo, RN Phone Number: 03/19/2019, 9:52 AM  Clinical Narrative:                 From home alone, daughter in room, states she would like [facility] for patient when she goes home and she does not have a preference of which agency.  She will transport patient home.  She states she wants to speak with MD this am , she thinks patient needs a neb machine.  NCM notified MD of this information. Will need HHRN order and neb machine order.  NCM received confirmation from [facility] with [facility] they can take referral for [facility].  NCM made referral to [place] with [facility] for neb machine also.  Expected Discharge Plan: [place] Barriers to Discharge: No Barriers Identified   Patient Goals and CMS Choice Patient states their goals for this hospitalization and ongoing recovery are:: go home CMS Medicare.gov Compare Post Acute Care list provided to:: Patient Represenative (must comment)(daughter, [place]) Choice offered to / list presented to : Adult Children  Expected Discharge Plan and Services Expected Discharge Plan: [place] In-house Referral: NA Discharge Planning Services: CM Consult Post Acute Care Choice: Home Health Living arrangements for the past 2 months: Single Family Home Expected Discharge Date: 03/19/19               DME Arranged: (NA)         HH Arranged: RN, Disease Management HH Agency: [facility] [employer] Date [place]: 03/19/19 Time [place]: [phone] Representative spoke with at [place]: Alwyn Ren  Prior Living Arrangements/Services Living arrangements for the past 2 months: [place] with:: Self Patient language and need for interpreter reviewed:: Yes Do you feel safe going back to the place where  you live?: Yes      Need for Family Participation in Patient Care: Yes (Comment) Care giver support system in place?: Yes (comment)   Criminal Activity/Legal Involvement Pertinent to Current Situation/Hospitalization: No - Comment as needed  Activities of Daily Living Home Assistive Devices/Equipment: CBG Meter, Cane (specify quad or straight) ADL Screening (condition at time of admission) Patient's cognitive ability adequate to safely complete daily activities?: Yes Is the patient deaf or have difficulty hearing?: No Does the patient have difficulty seeing, even when wearing glasses/contacts?: No Does the patient have difficulty concentrating, remembering, or making decisions?: No Patient able to express need for assistance with ADLs?: Yes Does the patient have difficulty dressing or bathing?: No Independently performs ADLs?: Yes (appropriate for developmental age) Does the patient have difficulty walking or climbing stairs?: No Weakness of Legs: None Weakness of Arms/Hands: None  Permission Sought/Granted                  Emotional Assessment Appearance:: Appears stated age     Orientation: : Oriented to Self, Oriented to Place, Oriented to  Time, Oriented to Situation Alcohol / Substance Use: Not Applicable Psych Involvement: No (comment)  Admission diagnosis:  Chest pain, unspecified type [R07.9] Asthma with acute exacerbation, unspecified asthma severity, unspecified whether persistent [J45.901] Exacerbation of asthma, unspecified asthma severity, unspecified whether persistent [J45.901] Patient Active Problem List   Diagnosis Date Noted  . Acute on chronic combined systolic and diastolic CHF (congestive heart failure) (  [place]) 03/18/2019  . Anemia of chronic disease 03/17/2019  . ESRD on hemodialysis ([place]) 03/17/2019  . Sinus node dysfunction ([place]) 01/25/2019  . Ischemic cardiomyopathy 01/25/2019  . Hyperkalemia 01/18/2019  . Acute lower UTI 01/18/2019  . Elevated  troponin   . Junctional bradycardia   . Bradycardia 01/17/2019  . S/P coronary artery stent placement   . Acute combined systolic and diastolic heart failure ([place])   . Non-ST elevation (NSTEMI) myocardial infarction ([place]) 01/08/2019  . Acute respiratory failure ([place]) 01/07/2019  . Diabetes mellitus with peripheral circulatory disorder ([place]) 02/05/2018  . Altered mental status 03/13/2017  . Claudication in peripheral vascular disease ([place]) 12/29/2016  . Abnormal weight loss 06/05/2016  . Bilateral pleural effusion 11/23/2015  . Chronic combined systolic and diastolic CHF (congestive heart failure) ([place]) 11/23/2015  . Dyspnea 11/23/2015  . Pleural effusion 11/23/2015  . CAD (coronary artery disease) 10/05/2015  . S/P CABG x 4 09/19/2015  . History of coronary artery bypass surgery 09/19/2015  . Prolonged Q-T interval on ECG 09/17/2015  . Angina pectoris ([place])   . Gastroesophageal reflux disease 09/15/2015  . Gout 09/15/2015  . Chest pain 09/15/2015  . Hyperlipidemia   . Diabetes mellitus with renal complications ([place])   . Obesity (BMI 30.0-34.9) 08/02/2015  . Asthma 04/27/2015  . Vitamin D deficiency 04/27/2015  . Hyperlipidemia associated with type 2 diabetes mellitus ([place]) 04/27/2015  . Essential hypertension 04/24/2015  . ESRD (end stage renal disease) ([place]) 04/24/2015  . Type 2 diabetes mellitus with hyperglycemia ([place]) 04/24/2015  . Metatarsalgia of both feet 07/25/2014  . Equinus deformity of foot, acquired 07/25/2014  . Onychomycosis 07/25/2014  . Pain in lower limb 07/25/2014  . Acquired equinus deformity of foot 07/25/2014  . Metatarsalgia 07/25/2014  . Pain of lower extremity 07/25/2014   PCP:  Benito Mccreedy, MD Pharmacy:    [phone] - [place] [place] [address] Phone: [phone] Fax: [place], [place] [address] Phone: [phone] Fax: [phone]  [facility] [place], [place] [address] Phone: [phone] Fax: [phone]     Social Determinants of Health (SDOH) Interventions    Readmission Risk Interventions Readmission Risk Prevention Plan 03/19/2019  Transportation Screening Complete  Medication Review ([place]) Complete  PCP or Specialist appointment within 3-5 days of discharge Complete  HRI or [place] Complete  SW Recovery Care/Counseling Consult Complete  [place] Not Applicable  Some recent data might be hidden

## 2019-03-19 NOTE — Discharge Instructions (Signed)
Please take Imdur prior to Hemodyalisis to prevent chest pain.

## 2019-03-19 NOTE — Progress Notes (Addendum)
[facility] Progress Note   Dialysis Orders: [facility] T[facility] EDW 166# = 74.5 kg  3K 2.5 Ca 350/700 left AVF 16 g  Heparin none Mircera 30 given Q000111Q , ferrlicit [id number] Q000111Q  Post wt 9/8 154.4, 167 8/10   Assessment/Plan: 1. Dyspnea/pulm edema: Extra HD yesterday for volume -Per cardiology, she will take isosorbide pre-HD - will keep eye on BP - ok today 2.  ESRD: Usual TTS schedule.K 3.7 changedto 4 K bath - will notify her HD unit of lower EDW at d/c 3.  Hypertension/volume: BP stable - UF as tolerated, consider lowering hydralazine dose if BP drops. Given weights/BP - suspect she has lost EDW - no BP drop at outpatient HD sheets. - lower EDW for d/c - nursing to stand post HD and challenge volume more if BP doesn't come down SPB 160s at present 4.  Anemia: Hgb 9.0 - prior outpatient labs not sent but did get mircera 30 and 62.5 9/10 at outpatient HD - continue to trend 5.  Metabolic bone disease: Ca ok, Phos pending. Continue home binders (Auryxia) and Hectorol 1 6.  T2 DM: Per primary. 7.  CAD/angina: Cardiology consulted, recs appreciated.   Myriam Jacobson, PA-C [place] 03/19/2019,8:08 AM  LOS: 1 day   Subjective:   Lives alone - doesn't know EDW - can stand for weights/walks around independently  - had bed weight this am.  Objective Vitals:   03/18/19 2130 03/18/19 2232 03/19/19 0407 03/19/19 0432  BP: (!) 150/83 103/80 134/70   Pulse: 88 94 83   Resp: 15 20 16    Temp: 97.8 F (36.6 C) 98.3 F (36.8 C) 98.4 F (36.9 C)   TempSrc: Oral Oral Oral   SpO2: 100% 100% 99%   Weight: 71.3 kg   71.2 kg  Height:       Physical Exam General: supine on HD breathing easily  Heart: RRR Lungs: clear anteriorly Abdomen: soft NT  Extremities: tr LE edema Dialysis Access: TDC and left AVF at full blood flow rate 400   Additional Objective Labs: Basic Metabolic Panel: Recent Labs  Lab 03/17/19 1100 03/18/19 1810 03/19/19 0422  NA  138 140 136  K 4.3 4.3 3.7  CL 101 102 98  CO2 23 25 26   GLUCOSE 224* 129* 184*  BUN 19 28* 11  CREATININE 2.98* 4.60* 2.71*  CALCIUM 8.4* 8.8* 8.5*  PHOS  --  3.4  --    Liver Function Tests: Recent Labs  Lab 03/18/19 1810  ALBUMIN 3.2*   No results for input(s): LIPASE, AMYLASE in the last 168 hours. CBC: Recent Labs  Lab 03/17/19 1100 03/18/19 1810 03/19/19 0422  WBC 8.4 6.6 6.4  NEUTROABS 6.2  --   --   HGB 9.2* 8.4* 9.0*  HCT 29.4* 26.7* 27.8*  MCV 95.5 94.7 92.7  PLT 158 192 195   Blood Culture    Component Value Date/Time   SDES URINE, RANDOM 01/18/2019 0748   SPECREQUEST NONE 01/18/2019 0748   CULT (A) 01/18/2019 0748    <10,000 COLONIES/mL INSIGNIFICANT GROWTH Performed at [facility] [address], [place], [place] [address]    REPTSTATUS 01/19/2019 FINAL 01/18/2019 0748    Cardiac Enzymes: No results for input(s): CKTOTAL, CKMB, CKMBINDEX, TROPONINI in the last 168 hours. CBG: Recent Labs  Lab 03/17/19 2153 03/18/19 0749 03/18/19 1129 03/18/19 1605 03/18/19 2229  GLUCAP 176* 159* 243* 103* 98   Iron Studies: No results for input(s): IRON,  TIBC, TRANSFERRIN, FERRITIN in the last 72 hours. Lab Results  Component Value Date   INR 1.53 (H) 09/19/2015   INR 1.01 09/17/2015   INR 1.07 09/15/2015   Studies/Results: Dg Chest Portable 1 View  Result Date: 03/17/2019 CLINICAL DATA:  Shortness of breath during dialysis. EXAM: PORTABLE CHEST 1 VIEW COMPARISON:  Chest radiograph dated 03/05/2019 and 02/10/2019. FINDINGS: The heart size remains enlarged. The lung bases are obscured which likely reflects small bilateral pleural effusions with associated atelectasis/airspace disease. There is mild pulmonary vascular congestion. There is no pneumothorax. A right internal jugular dialysis catheter tip overlies the right atrium, unchanged. Median sternotomy wires are unchanged. IMPRESSION: Small bilateral pleural effusions with associated  atelectasis/airspace disease. Mild pulmonary vascular congestion. Electronically Signed   By: Zerita Boers M.D.   On: 03/17/2019 10:47   Medications:  . allopurinol  100 mg Oral BID  . aspirin EC  81 mg Oral Daily  . atorvastatin  80 mg Oral q1800  . brimonidine  1 drop Both Eyes BID  . carvedilol  12.5 mg Oral BID  . Chlorhexidine Gluconate Cloth  6 each Topical Q0600  . clopidogrel  75 mg Oral Daily  . ferric citrate  210 mg Oral TID WC  . heparin  5,000 Units Subcutaneous Q8H  . hydrALAZINE  50 mg Oral TID  . influenza vaccine adjuvanted  0.5 mL Intramuscular Tomorrow-1000  . insulin aspart  0-9 Units Subcutaneous TID WC  . isosorbide mononitrate  120 mg Oral Daily  . latanoprost  1 drop Both Eyes QHS  . Melatonin  6 mg Oral QHS  . multivitamin  1 tablet Oral Daily  . nitroGLYCERIN  0.4 mg Sublingual Once  . pantoprazole  20 mg Oral Daily  . sodium chloride flush  3 mL Intravenous Q12H  . Urelle  1 tablet Oral TID

## 2019-03-21 ENCOUNTER — Other Ambulatory Visit: Payer: Self-pay | Admitting: *Deleted

## 2019-03-21 NOTE — Patient Outreach (Signed)
[facility]) Care Management  03/21/2019  Grizelda Amparano 07-Mar-1931 [id number]  Telephone Assessment-Unsuccessful  RN attempted outreach based upon recent discharge from the hospital 9/13 (TOC to completed by MD). RN able to leave a HIPAA approved voice message requesting a call back. Will follow up within the next 4 business days for ongoing case management services,.  Raina Mina, RN Care Management Coordinator [place] Office [phone]

## 2019-03-22 DIAGNOSIS — N2581 Secondary hyperparathyroidism of renal origin: Secondary | ICD-10-CM | POA: Diagnosis not present

## 2019-03-22 DIAGNOSIS — Z23 Encounter for immunization: Secondary | ICD-10-CM | POA: Diagnosis not present

## 2019-03-22 DIAGNOSIS — N186 End stage renal disease: Secondary | ICD-10-CM | POA: Diagnosis not present

## 2019-03-23 DIAGNOSIS — E1122 Type 2 diabetes mellitus with diabetic chronic kidney disease: Secondary | ICD-10-CM | POA: Diagnosis not present

## 2019-03-23 DIAGNOSIS — N186 End stage renal disease: Secondary | ICD-10-CM | POA: Diagnosis not present

## 2019-03-23 DIAGNOSIS — E1151 Type 2 diabetes mellitus with diabetic peripheral angiopathy without gangrene: Secondary | ICD-10-CM | POA: Diagnosis not present

## 2019-03-23 DIAGNOSIS — J45901 Unspecified asthma with (acute) exacerbation: Secondary | ICD-10-CM | POA: Diagnosis not present

## 2019-03-23 DIAGNOSIS — I132 Hypertensive heart and chronic kidney disease with heart failure and with stage 5 chronic kidney disease, or end stage renal disease: Secondary | ICD-10-CM | POA: Diagnosis not present

## 2019-03-23 DIAGNOSIS — I5043 Acute on chronic combined systolic (congestive) and diastolic (congestive) heart failure: Secondary | ICD-10-CM | POA: Diagnosis not present

## 2019-03-23 DIAGNOSIS — D631 Anemia in chronic kidney disease: Secondary | ICD-10-CM | POA: Diagnosis not present

## 2019-03-23 DIAGNOSIS — N2581 Secondary hyperparathyroidism of renal origin: Secondary | ICD-10-CM | POA: Diagnosis not present

## 2019-03-23 DIAGNOSIS — M216X9 Other acquired deformities of unspecified foot: Secondary | ICD-10-CM | POA: Diagnosis not present

## 2019-03-24 DIAGNOSIS — Z23 Encounter for immunization: Secondary | ICD-10-CM | POA: Diagnosis not present

## 2019-03-24 DIAGNOSIS — I10 Essential (primary) hypertension: Secondary | ICD-10-CM | POA: Diagnosis not present

## 2019-03-24 DIAGNOSIS — E785 Hyperlipidemia, unspecified: Secondary | ICD-10-CM | POA: Diagnosis not present

## 2019-03-24 DIAGNOSIS — N186 End stage renal disease: Secondary | ICD-10-CM | POA: Diagnosis not present

## 2019-03-24 DIAGNOSIS — E1165 Type 2 diabetes mellitus with hyperglycemia: Secondary | ICD-10-CM | POA: Diagnosis not present

## 2019-03-24 DIAGNOSIS — N2581 Secondary hyperparathyroidism of renal origin: Secondary | ICD-10-CM | POA: Diagnosis not present

## 2019-03-25 ENCOUNTER — Other Ambulatory Visit: Payer: Self-pay | Admitting: *Deleted

## 2019-03-25 NOTE — Addendum Note (Signed)
Addended by: Mendel Ryder on: 03/25/2019 04:08 PM   Modules accepted: Orders

## 2019-03-25 NOTE — Patient Outreach (Signed)
[facility]) Care Management  03/25/2019  Marah Park [birth year] [id number]    Telephone Assessment Primary Care provider to completed the Transition of care d/c 9/12  RN spoke with pt today and received an update on her ongoing management of care. Pt reports she currently is having issues with voiding but has not reported this to her provider. RN inquired on fluid intake to increase voiding however pt states her provider has informed her to limit her fluid intake to prevent swelling. Pt also indicated burinng sensation earlier and provider a round of antibiotics however she continues to have the burning sensation. States she has contacted her provider but no return call. RN offered to provide her with the available providers contact and inquired if there was supportive family in the home to assist. Pt permitted RN to speak with her son Albertina Parr and provider her son with the provider's number to call and provide detail information concerning pt's symptoms such as the duration and severity. RN stress the importance these prevention measures to avoid ED visit or readmission. Son verbalized an understanding and will follow up with the provider.  RN will update the plan of care with discussion to both the pt and her son to avoid acute issues from occurring. Will follow up in a few weeks on pt's interventions and re-evaluate pt's management of care. Note pt has reported she has completed the cardiac monitoring and pending the results prior to possible intervention.  No more episodes of bradycardia and breathing much better since her recent discharged from the hospital (pulomanry issues reported). Follow up accordingly.  THN CM Care Plan Problem One     Most Recent Value  Care Plan Problem One  Recent hospitalization related to Bradycardia  Role Documenting the Problem One  Care Management [place] for Problem One  Active  THN Long Term Goal   Pt will not have a  readmission over the next 31 days. [Will extend due to recent hospitalization related to c/p]  [facility] Long Term Goal Start Date  01/20/19  Interventions for Problem One Long Term Goal  Will strongly encourage pt to follow up with her providers and adhere to all recommendations post op hospital discharge. Will verified pt understandings all recommendations and will contact her provider with any needs.  THN CM Short Term Goal #1   Adherence with post op pending medical appointments over the next 30 days.  THN CM Short Term Goal #1 Start Date  01/20/19 [Will extend to allow adhere related to a recent admission]  Interventions for Short Term Goal #1  Will again encouraged adhere with all follow up appointments due to a recent hospitalization. Will stress the importance of reporting all issues to her provider to avoid acute issues from occurring.  THN CM Short Term Goal #3  Pt will apply the 30 cardiac monitoring for the next 30 days.  THN CM Short Term Goal #3 Start Date  02/09/19  [facility] CM Short Term Goal #3 Met Date  03/25/19  Interventions for Short [place] Goal #3  Currently pending results    [facility] CM Care Plan Problem Two     Most Recent Value  Care Plan Problem Two  GU issues related to difficulty voiding and possible infection  Role Documenting the Problem Two  Care Management Coordinator  Care Plan for Problem Two  Active  THN CM Short Term Goal #1   Pt will contact her provider with the reported symptoms of delay voiding  and burning sensation upon urination within the next 2 weeks for interventions.   THN CM Short Term Goal #1 Start Date  03/25/19  Interventions for Short Term Goal #2   Will discuss why it is improtant for pt to follow up with any symptoms related to a possible infection and stress dyhdration for increase in output however avoid high intake of fluid to reduce any incured swelling or issues with edema.       Raina Mina, RN Care Management Coordinator [place] Office [phone]

## 2019-03-26 DIAGNOSIS — N2581 Secondary hyperparathyroidism of renal origin: Secondary | ICD-10-CM | POA: Diagnosis not present

## 2019-03-26 DIAGNOSIS — N186 End stage renal disease: Secondary | ICD-10-CM | POA: Diagnosis not present

## 2019-03-26 DIAGNOSIS — Z23 Encounter for immunization: Secondary | ICD-10-CM | POA: Diagnosis not present

## 2019-03-27 DIAGNOSIS — I132 Hypertensive heart and chronic kidney disease with heart failure and with stage 5 chronic kidney disease, or end stage renal disease: Secondary | ICD-10-CM | POA: Diagnosis not present

## 2019-03-27 DIAGNOSIS — I5043 Acute on chronic combined systolic (congestive) and diastolic (congestive) heart failure: Secondary | ICD-10-CM | POA: Diagnosis not present

## 2019-03-27 DIAGNOSIS — J45901 Unspecified asthma with (acute) exacerbation: Secondary | ICD-10-CM | POA: Diagnosis not present

## 2019-03-27 DIAGNOSIS — D631 Anemia in chronic kidney disease: Secondary | ICD-10-CM | POA: Diagnosis not present

## 2019-03-27 DIAGNOSIS — E1122 Type 2 diabetes mellitus with diabetic chronic kidney disease: Secondary | ICD-10-CM | POA: Diagnosis not present

## 2019-03-27 DIAGNOSIS — M216X9 Other acquired deformities of unspecified foot: Secondary | ICD-10-CM | POA: Diagnosis not present

## 2019-03-27 DIAGNOSIS — E1151 Type 2 diabetes mellitus with diabetic peripheral angiopathy without gangrene: Secondary | ICD-10-CM | POA: Diagnosis not present

## 2019-03-27 DIAGNOSIS — N186 End stage renal disease: Secondary | ICD-10-CM | POA: Diagnosis not present

## 2019-03-27 DIAGNOSIS — N2581 Secondary hyperparathyroidism of renal origin: Secondary | ICD-10-CM | POA: Diagnosis not present

## 2019-03-28 DIAGNOSIS — T8241XD Breakdown (mechanical) of vascular dialysis catheter, subsequent encounter: Secondary | ICD-10-CM | POA: Diagnosis not present

## 2019-03-29 ENCOUNTER — Other Ambulatory Visit: Payer: Self-pay

## 2019-03-29 DIAGNOSIS — N186 End stage renal disease: Secondary | ICD-10-CM | POA: Diagnosis not present

## 2019-03-29 DIAGNOSIS — D631 Anemia in chronic kidney disease: Secondary | ICD-10-CM | POA: Diagnosis not present

## 2019-03-29 DIAGNOSIS — E8779 Other fluid overload: Secondary | ICD-10-CM | POA: Diagnosis not present

## 2019-03-29 DIAGNOSIS — D509 Iron deficiency anemia, unspecified: Secondary | ICD-10-CM | POA: Diagnosis not present

## 2019-03-29 DIAGNOSIS — N2581 Secondary hyperparathyroidism of renal origin: Secondary | ICD-10-CM | POA: Diagnosis not present

## 2019-03-31 DIAGNOSIS — R309 Painful micturition, unspecified: Secondary | ICD-10-CM | POA: Diagnosis not present

## 2019-03-31 DIAGNOSIS — N186 End stage renal disease: Secondary | ICD-10-CM | POA: Diagnosis not present

## 2019-03-31 DIAGNOSIS — E8779 Other fluid overload: Secondary | ICD-10-CM | POA: Diagnosis not present

## 2019-03-31 DIAGNOSIS — D631 Anemia in chronic kidney disease: Secondary | ICD-10-CM | POA: Diagnosis not present

## 2019-03-31 DIAGNOSIS — N2581 Secondary hyperparathyroidism of renal origin: Secondary | ICD-10-CM | POA: Diagnosis not present

## 2019-03-31 DIAGNOSIS — D509 Iron deficiency anemia, unspecified: Secondary | ICD-10-CM | POA: Diagnosis not present

## 2019-04-01 ENCOUNTER — Ambulatory Visit: Payer: Medicare HMO | Admitting: Physician Assistant

## 2019-04-01 DIAGNOSIS — J45901 Unspecified asthma with (acute) exacerbation: Secondary | ICD-10-CM | POA: Diagnosis not present

## 2019-04-01 DIAGNOSIS — M216X9 Other acquired deformities of unspecified foot: Secondary | ICD-10-CM | POA: Diagnosis not present

## 2019-04-01 DIAGNOSIS — I5043 Acute on chronic combined systolic (congestive) and diastolic (congestive) heart failure: Secondary | ICD-10-CM | POA: Diagnosis not present

## 2019-04-01 DIAGNOSIS — E1122 Type 2 diabetes mellitus with diabetic chronic kidney disease: Secondary | ICD-10-CM | POA: Diagnosis not present

## 2019-04-01 DIAGNOSIS — N186 End stage renal disease: Secondary | ICD-10-CM | POA: Diagnosis not present

## 2019-04-01 DIAGNOSIS — D631 Anemia in chronic kidney disease: Secondary | ICD-10-CM | POA: Diagnosis not present

## 2019-04-01 DIAGNOSIS — E1151 Type 2 diabetes mellitus with diabetic peripheral angiopathy without gangrene: Secondary | ICD-10-CM | POA: Diagnosis not present

## 2019-04-01 DIAGNOSIS — I132 Hypertensive heart and chronic kidney disease with heart failure and with stage 5 chronic kidney disease, or end stage renal disease: Secondary | ICD-10-CM | POA: Diagnosis not present

## 2019-04-01 DIAGNOSIS — N2581 Secondary hyperparathyroidism of renal origin: Secondary | ICD-10-CM | POA: Diagnosis not present

## 2019-04-02 DIAGNOSIS — N186 End stage renal disease: Secondary | ICD-10-CM | POA: Diagnosis not present

## 2019-04-02 DIAGNOSIS — D509 Iron deficiency anemia, unspecified: Secondary | ICD-10-CM | POA: Diagnosis not present

## 2019-04-02 DIAGNOSIS — N2581 Secondary hyperparathyroidism of renal origin: Secondary | ICD-10-CM | POA: Diagnosis not present

## 2019-04-02 DIAGNOSIS — E8779 Other fluid overload: Secondary | ICD-10-CM | POA: Diagnosis not present

## 2019-04-02 DIAGNOSIS — D631 Anemia in chronic kidney disease: Secondary | ICD-10-CM | POA: Diagnosis not present

## 2019-04-04 ENCOUNTER — Ambulatory Visit (INDEPENDENT_AMBULATORY_CARE_PROVIDER_SITE_OTHER): Payer: Medicare HMO | Admitting: Cardiology

## 2019-04-04 ENCOUNTER — Encounter: Payer: Self-pay | Admitting: Cardiology

## 2019-04-04 ENCOUNTER — Other Ambulatory Visit: Payer: Self-pay

## 2019-04-04 VITALS — BP 128/54 | HR 76 | Ht 62.0 in | Wt 159.4 lb

## 2019-04-04 DIAGNOSIS — I959 Hypotension, unspecified: Secondary | ICD-10-CM | POA: Diagnosis not present

## 2019-04-04 DIAGNOSIS — I10 Essential (primary) hypertension: Secondary | ICD-10-CM

## 2019-04-04 DIAGNOSIS — I2510 Atherosclerotic heart disease of native coronary artery without angina pectoris: Secondary | ICD-10-CM | POA: Diagnosis not present

## 2019-04-04 DIAGNOSIS — I5042 Chronic combined systolic (congestive) and diastolic (congestive) heart failure: Secondary | ICD-10-CM

## 2019-04-04 DIAGNOSIS — D649 Anemia, unspecified: Secondary | ICD-10-CM

## 2019-04-04 DIAGNOSIS — Z79899 Other long term (current) drug therapy: Secondary | ICD-10-CM

## 2019-04-04 MED ORDER — HYDRALAZINE HCL 25 MG PO TABS: 25.0000 mg | ORAL_TABLET | Freq: Three times a day (TID) | ORAL | 1 refills | Status: DC

## 2019-04-04 NOTE — Telephone Encounter (Signed)
Pts daughter is calling in to make an appt today to see Dr. [name] or Cecilie Kicks NP, for ongoing BP issues, which have been managed by both Providers.  Per the daughter [name], she attached some BP readings from where the pt has been taking them, for she is feeling slightly dizzy.  Pts BP readings are one reading 91/45 and the second one 93/49, HR in the 80s.  Daughter states that she would help the pt manage this at home usually, but being she is slightly symptomatic today, she would like her to be seen instead, incase further med adjustments need to be made.  Scheduled the pt to see Cecilie Kicks NP for today 9/28 at 1:30 pm.  Advised the pts daughter to have her here 15 mins prior to this appt, and they should wear their facial mask. Advised the pts daughter to have her hydrate for now, and we will see her then. Informed the pts daughter that I will give Mickel Baas a heads up about adding this pt to her schedule today, for low BP and dizziness.  Daughter verbalized understanding and agrees with this plan.  Daughter was more than gracious for all the assistance provided.

## 2019-04-04 NOTE — Patient Instructions (Signed)
Medication Instructions:   Your physician has recommended you make the following change in your medication:   1) Decrease Hydralazine to 25MG , 1 tablet by mouth three times a day  If you need a refill on your cardiac medications before your next appointment, please call your pharmacy.   Lab work:  You will have labs drawn today: CBC and BMET  If you have labs (blood work) drawn today and your tests are completely normal, you will receive your results only by: Marland Kitchen MyChart Message (if you have MyChart) OR . A paper copy in the mail If you have any lab test that is abnormal or we need to change your treatment, we will call you to review the results.  Testing/Procedures:  None ordered today  Follow-Up:  Keep your follow up on 05/02/19 at 1:40PM with Ena Dawley, MD  Any Other Special Instructions Will Be Listed Below (If Applicable).  Call the office if you systolic(top number) goes below 120 and you become symptomatic.

## 2019-04-04 NOTE — Progress Notes (Signed)
Cardiology Office Note   Date:  04/05/2019   ID:  Monica [name], DOB 03-Sep-1930, MRN [id number]  PCP:  Benito Mccreedy, MD  Cardiologist:    Dr. [name]  Chief Complaint  Patient presents with  . Hypotension      History of Present Illness: Monica [name] is a 85 y.o. female who presents for  Hypotension this AM  Her past medical history significant for CAD (s/p CABG 2017, NSTEMI 01/2019 with PCI), chronic combined CHF, moderate mitral regurgitation, anemia, bradycardia/sinus node dysfunction, ESRD on HD TThS, GERD, hyperlipidemia and diabetes type 2.  The patient was admitted to the hospital in early July for NSTEMI([id number]) 01/07/2019 with cath showing occluded/atretic SVG to the distal RCA, severe proximal to stent and in-stent restenosis of SVG to OM with successful PTCA/with overlapping DES stent and successful DES to the ostial proximal SVG to the RI.Echo showed new reduced LVEF 30 to 35% with moderately dilated right and left atrium moderate MR and mild AI. She was placed on ASA, Plaix and metoprolol. She was readmitted in mid-July with fatigue and dizziness with junctional bradycardia HR in the 40s, therefore metoprolol was discontinued. She was then again readmitted 02/10/19 with chest pain and elevated troponin (peak 264). Nuclear stress test was abnormal, prompting repeat cath with findings above - felt to be stable disease, no focal targets for PCI. Imdur was titrated with consideration for addition of Ranexa although it does not appear this medicine has been studied in dialysis patients.She wasre-trialedon low dose carvedilolfor antianginal effect.She is not on ACEI/ARB/ARNI/spiro due to renal dysfunction. Last labs 02/2019 showed Hgb 10.6, LDL 63, K 4.5, Cr 4.61, TSH wnl.  At office visit 02/18/2019 the patient reported having felt well after discharge but during a dialysis session the prior day she had an episode of chest pain and EMS was called.  Blood pressure was  reportedly 198 from dialysis notes.  The patient took 2 sublingual nitroglycerin with relief and declined to be transported to the hospital.  She went home and rested and felt better.  Blood pressure remained above goal.  Imdur and hydralazine were increased.  It was noted that at some point amlodipine was stopped, unclear reason.  The patient was recently seen in the office on 02/25/2019 by Cecilie Kicks, NP for follow-up.  It was noted that her blood pressure was improved at times.  She was still having some chest pain with dialysis at the end due to elevated blood pressure.  It was noted that the patient was not taking any medications prior to dialysis so her blood pressure would run high during sessions.  Nephrology had decreased how much fluid they were removing and the patient stated that she felt a lot better.  Blood pressure was still elevated at this office visit so Imdur was increased to 120 mg daily.  Carvedilol was increased to 12.5 mg BID.   The patient return 03/15/19 for close follow-up of blood pressure and chest pain with her daughter. She is unable to take her BP meds prior to dialysis due to dropping BP with dialysis. She is taking her hydralazine right after dialysis. Her BP has been up and down but daughter reports that her BP is a little more regulated.   On follow up visit she was having spells of shortness of breath, getting progressively worse. Her chest feels heavy with the shortness of breath but no chest pain like prior to her stents. She had to have oxygen at dialysis this morning. She  has tried using her inhaler and her daughter says that she uses it over and over in trying to catch her breath. The patient denies any wheezing. This usually occurs on the mornings prior to dialysis. If she rushes it is worse. She tries to pace herself. On Saturday she had an episode after dialysis. Around 8/29 they did a CXR after dialysis to ensure that they were taking enough fluid off and her  daughter says they told her that they were.  Her PCP has told her that the shortness of breath is not due to asthma.  Her daughter has a pulse oximeter and says that her pulse ox drops from the upper 90s to the lower 90s when she is having difficulty breathing, but does not go below 91%.  No longer having palpitations as she was having previously.   Event monitor.:  Sinus bradycardia to sinus rhythm. 1. AVB.  Few short runs of nsVT, the longest lasting 6 beats.   No pauses or atrial fibrillation.   She was hospitalized 03/17/19 for volume overload, her troponins HS were in the 51 and 27  She had dialysis extra day and her imdur she was to take half prior to dialysis.   Since discharge,  She has done well with no SOB or chest pain.  Weight is slightly up from discharge.  It may be she is having more volume removed.  Lower EDW for post dialysis. So now BP is lower.  She was symptomatic with lower BP to 0000000 systolic this AM and she feels bad after taking her meds after dialysis.       152lb which is 68.9Kg. on discharge.  Her daughter is with her today.   Past Medical History:  Diagnosis Date  . Anemia   . Asthma   . Chronic combined systolic and diastolic CHF (congestive heart failure) ([place])    a. Echo 11/19 [facility]):  EF 55-65, normal wall motion, normal diastolic function. b. Echo 01/2019 EF 30-35%, moderate MR, mild AI.  Marland Kitchen Coronary artery disease    a. s/p CABG 2017. b.s/p NSTEMI 11/19 [facility]) >> S-PDA 100 >>PCI: 2.25 x 24 mm Synergy DES to S-OM. c. NSTEMI 01/2019 - occluded SVG-distal RCA, severe proximal to stent/ISR of SVG-OM s/p overlapping DES, DES to ostial prox SVG to the RI.   Marland Kitchen ESRD (end stage renal disease)    Dialysis Tu, Th, Sa  . Essential hypertension   . Gastroesophageal reflux disease   . History of blood transfusion   . Hyperlipidemia   . Junctional bradycardia   . Moderate mitral regurgitation   . Peptic ulcer   . Protein calorie malnutrition ([place])    . Sinus node dysfunction ([facility])   . Type 2 diabetes mellitus ([place])     Past Surgical History:  Procedure Laterality Date  . ABDOMINAL HYSTERECTOMY    . CARDIAC CATHETERIZATION N/A 09/17/2015   Procedure: Left Heart Cath and Coronary Angiography;  Surgeon: Jettie Booze, MD;  Location: [place] CV [facility];  Service: Cardiovascular;  Laterality: N/A;  . COLON SURGERY    . CORONARY ARTERY BYPASS GRAFT N/A 09/19/2015   Procedure: CORONARY ARTERY BYPASS GRAFTING (CABG) TIMES FOUR USING BILATARAL SAPHENOUS VEIN GRAFTS AND LEFT INTERNAL MAMMARY ARTERY;  Surgeon: [name], MD;  Location: [place];  Service: Open Heart Surgery;  Laterality: N/A;  . CORONARY STENT INTERVENTION N/A 01/10/2019   Procedure: CORONARY STENT INTERVENTION;  Surgeon: Leonie Man, MD;  Location: [place]  CV LAB;  Service: Cardiovascular;  Laterality: N/A;  . LEFT HEART CATH AND CORS/GRAFTS ANGIOGRAPHY N/A 01/10/2019   Procedure: LEFT HEART CATH AND CORS/GRAFTS ANGIOGRAPHY;  Surgeon: Leonie Man, MD;  Location: [place] [facility];  Service: Cardiovascular;  Laterality: N/A;  . LEFT HEART CATH AND CORS/GRAFTS ANGIOGRAPHY N/A 02/11/2019   Procedure: LEFT HEART CATH AND CORS/GRAFTS ANGIOGRAPHY;  Surgeon: Burnell Blanks, MD;  Location: [place] [facility];  Service: Cardiovascular;  Laterality: N/A;  . TEE WITHOUT CARDIOVERSION N/A 09/19/2015   Procedure: TRANSESOPHAGEAL ECHOCARDIOGRAM (TEE);  Surgeon: [name], MD;  Location: [place];  Service: Open Heart Surgery;  Laterality: N/A;     Current Outpatient Medications  Medication Sig Dispense Refill  . acetaminophen (TYLENOL) 500 MG tablet Take 1 tablet (500 mg total) by mouth every 6 (six) hours as needed for mild pain or headache. 30 tablet 0  . albuterol (PROVENTIL HFA;VENTOLIN HFA) 108 (90 Base) MCG/ACT inhaler Inhale 2 puffs into the lungs every 6 (six) hours as needed for wheezing or shortness of breath.    . allopurinol (ZYLOPRIM) 100 MG  tablet Take 100 mg by mouth 2 (two) times daily.     Marland Kitchen aspirin EC 81 MG EC tablet Take 1 tablet (81 mg total) by mouth daily.    Marland Kitchen atorvastatin (LIPITOR) 80 MG tablet Take 80 mg by mouth daily at 6 PM.     . brimonidine (ALPHAGAN P) 0.1 % SOLN Place 1 drop into both eyes 2 (two) times daily.    . carvedilol (COREG) 12.5 MG tablet Take 1 tablet (12.5 mg total) by mouth 2 (two) times daily. 180 tablet 3  . Cholecalciferol (VITAMIN D-3) 1000 units CAPS Take 1,000 Units by mouth daily.     . clopidogrel (PLAVIX) 75 MG tablet Take 75 mg by mouth daily.     . diphenhydrAMINE (BENADRYL) 25 MG tablet Take 25 mg by mouth as needed for itching or allergies.     . ferric citrate (AURYXIA) 1 GM 210 MG(Fe) tablet Take 210 mg by mouth 3 (three) times daily with meals.     . isosorbide mononitrate (IMDUR) 60 MG 24 hr tablet Take 120 mg on Monday, Wednesday, Friday and Sunday.   Take 60 mg on (Tuesday, Thursday and Saturday)  Prior  to Dialysis and 60 mg After dialysis/ 120 tablet 0  . latanoprost (XALATAN) 0.005 % ophthalmic solution Place 1 drop into both eyes at bedtime.    Marland Kitchen loratadine (CLARITIN) 10 MG tablet Take 10 mg by mouth as needed for allergies.     . Melatonin 5 MG CAPS Take 5 mg by mouth at bedtime.     . multivitamin (RENA-VIT) TABS tablet Take 1 tablet by mouth daily.    . nitroGLYCERIN (NITROSTAT) 0.4 MG SL tablet Place 1 tablet (0.4 mg total) under the tongue every 5 (five) minutes as needed for chest pain. 25 tablet 12  . pantoprazole (PROTONIX) 20 MG tablet Take 20 mg by mouth daily.    Theda Belfast (URELLE/URISED) 81 MG TABS tablet Take 1 tablet (81 mg total) by mouth 3 (three) times daily. 3 tablet 0  . hydrALAZINE (APRESOLINE) 25 MG tablet Take 1 tablet (25 mg total) by mouth 3 (three) times daily. 270 tablet 3   No current facility-administered medications for this visit.     Allergies:   Patient has no known allergies.    Social History:  The patient  reports that she has never  smoked. She has  quit using smokeless tobacco.  Her smokeless tobacco use included snuff. She reports that she does not drink alcohol or use drugs.   Family History:  The patient's family history includes Heart attack in her father.    ROS:  General:no colds or fevers, no weight changes Skin:no rashes or ulcers HEENT:no blurred vision, no congestion CV:see HPI PUL:see HPI GI:no diarrhea constipation or melena, no indigestion GU:no hematuria, no dysuria MS:no joint pain, no claudication Neuro:no syncope, no lightheadedness Endo:+ diabetes, no thyroid disease  Wt Readings from Last 3 Encounters:  04/04/19 159 lb 6.4 oz (72.3 kg)  03/19/19 152 lb 1.9 oz (69 kg)  03/15/19 167 lb (75.8 kg)     PHYSICAL EXAM: VS:  BP (!) 128/54   Pulse 76   Ht 5\' 2"  (1.575 m)   Wt 159 lb 6.4 oz (72.3 kg)   SpO2 95%   BMI 29.15 kg/m  , BMI Body mass index is 29.15 kg/m. General:Pleasant affect, NAD Skin:Warm and dry, brisk capillary refill HEENT:normocephalic, sclera clear, mucus membranes moist Neck:supple, no JVD, no bruits  Heart:S1S2 RRR without murmur, gallup, rub or click Lungs:clear without rales, rhonchi, or wheezes [id number], non tender, + BS, do not palpate liver spleen or masses Ext:no lower ext edema, 2+ pedal pulses, 2+ radial pulses Neuro:alert and oriented X 3, MAE, follows commands, + facial symmetry    EKG:  EKG is ordered today. The ekg ordered today demonstrates SR - normal EKG no changes.   Recent Labs: 02/10/2019: ALT 24; TSH 2.019 03/17/2019: B Natriuretic Peptide 2,492.2 04/04/2019: BUN 41; Creatinine, Ser 5.87; Hemoglobin 9.4; Platelets 237; Potassium 4.7; Sodium 135    Lipid Panel    Component Value Date/Time   CHOL 122 02/11/2019 0536   CHOL 125 09/08/2018 0737   TRIG 43 02/11/2019 0536   HDL 50 02/11/2019 0536   HDL 50 09/08/2018 0737   CHOLHDL 2.4 02/11/2019 0536   VLDL 9 02/11/2019 0536   LDLCALC 63 02/11/2019 0536   LDLCALC 53 09/08/2018 0737        Other studies Reviewed: Additional studies/ records that were reviewed today include: .  Cardiac cath 02/11/19  Dist LM to Prox LAD lesion is 30% stenosed.  Mid LAD-1 lesion is 95% stenosed.  Mid LAD-2 lesion is 100% stenosed.  Ost Cx to Mid Cx lesion is 100% stenosed.  Prox RCA lesion is 70% stenosed.  Prox RCA to Mid RCA lesion is 90% stenosed.  Mid RCA lesion is 95% stenosed.  Dist RCA lesion is 100% stenosed with 100% stenosed side branch in RPAV.  Ramus lesion is 100% stenosed.  LIMA and is normal in caliber.  There is no competitive flow  SVG.  The graft exhibits severe diffuse disease.  Origin to Prox Graft lesion is 90% stenosed.  Prox Graft to Mid Graft lesion is 80% stenosed.  Dist Graft lesion is 100% stenosed.  SVG.  SVG.  Previously placed Origin to Prox Graft stent (unknown type) is widely patent.  Previously placed Dist Graft stent (unknown type) is widely patent.  1. Chronic occlusion of the LAD is known. The LIMA to the LAD is patent 2. Chronic occlusion of the Circumflex is known. The SVG to the ramus intermediate is patent with patent proximal stent.  The SVG to the OM is patent with patent distal body stent.  3. Chronic occlusion mid RCA. The SVG to the RCA is known to be occluded (not selectively engaged today).   Recommendations: No focal targets for PCI.  Continue medical management. Consider addition of Ranexa.  Diagnostic Dominance: Right   Cardiac cath [id number]  LV end diastolic pressure is normal.  Dist LM to Prox LAD lesion is 30% stenosed.  Mid LAD-1 lesion is 95% stenosed. Mid LAD-2 lesion is 100% stenosed.  Ramus lesion is 100% stenosed.  Ost Cx to Mid Cx lesion is 100% stenosed.  Prox RCA lesion is 70% stenosed. Prox RCA to Mid RCA lesion is 90% stenosed. Mid RCA lesion is 95% stenosed. Dist RCA lesion is 100% stenosed with 100% stenosed side branch in RPAV.  -------------GRAFTS--------------  LIMA-LAD graft was  visualized by angiography and is normal in caliber. There is no competitive flow  SVG-dRCA graft was visualized by angiography. The graft exhibits severe diffuse disease. Origin to Prox Graft lesion is 90% stenosed. Prox Graft to Mid Graft lesion is 80% stenosed follwed by Dist Graft lesion is 100% stenosed.  -----------INTERVENTION----------------  SVG-OM graft was visualized by angiography.  Mid Graft lesion is 75% stenosed.  A drug-eluting stent was successfully placed using a STENT SYNERGY DES 2.5X12. Postdilated 2.8 mm  ---------------------------  Mid Graft to Dist Graft Stent is 85% stenosed. Balloon angioplasty was performed using a BALLOON [place].  Post intervention, there is a 0% residual stenosis -in both the new stent, stent overlap in the previous stent.  SVG-RI graft was visualized by angiography. Origin to Prox Graft lesion is 85% stenosed.  A drug-eluting stent was successfully placed using a STENT SYNERGY DES 2.5X20. -Postdilated to 2.7 mm  Post intervention, there is a 0% residual stenosis.  SUMMARY  Severe native vessel CAD: 100% ostial Circumflex (LCx) and ostial Ramus Intermedius, diffuse calcified proximal LAD followed by 95% stenosis just after 1st Diag followed by 100% -all CTO; severe diffuse proximal to mid RCA disease with CTO of the distal RCA  Essentially occluded/atretic SVG-dRCA  Severe proximal to stent and in-stent restenosis in SVG-OM ? Successful PTCA followed by DES PCI with overlapping DES stent (Synergy 2.5 mm x 12 mm - 2.8 mm)  Severe ostial-proximal SVG-RI ? Successful DES PCI (Synergy 2.5 mm x 20 mm--2.7 mm)  Normal LVEDP despite significant systemic hypertension   RECOMMENDATIONS  Transfer to cardiology nursing unit for post PCI care after sheath removal in PACU holding area  Aggressive restricted medication with blood pressure management  Dialysis as indicated  We will need to treat occluded RCA system  medically  Echo 01/08/2019 IMPRESSIONS   1. The left ventricle has moderate-severely reduced systolic function, with an ejection fraction of 30-35%. The cavity size was normal. There is moderate concentric left ventricular hypertrophy. Left ventricular diastolic Doppler parameters are  consistent with pseudonormalization. Elevated left atrial and left ventricular end-diastolic pressures. 2. The right ventricle has moderately reduced systolic function. The cavity was normal. There is no increase in right ventricular wall thickness. Right ventricular systolic pressure is normal. 3. Left atrial size was moderately dilated. 4. Right atrial size was mildly dilated. 5. The mitral valve is degenerative. Moderate thickening of the mitral valve leaflet. There is mild mitral annular calcification present. Mitral valve regurgitation is moderate by color flow Doppler. 6. The aortic valve is tricuspid. Mild thickening of the aortic valve. Aortic valve regurgitation is mild by color flow Doppler.  SUMMARY  When compared to the prior study there is now new LV dysfunction with diffuse hypokinesis and LVEF 30-35%, RVEF is also moderately decreased. LVEDP is elevated.Mitral regurgitation is at least moderate. FINDINGS Left Ventricle: The left ventricle has moderate-severely reduced  systolic function, with an ejection fraction of 30-35%. The cavity size was normal. There is moderate concentric left ventricular hypertrophy. Left ventricular diastolic Doppler parameters are consistent with pseudonormalization. Elevated left atrial and left ventricular end-diastolic pressures  Right Ventricle: The right ventricle has moderately reduced systolic function. The cavity was normal. There is no increase in right ventricular wall thickness. Right ventricular systolic pressure is normal.  Left Atrium: Left atrial size was moderately dilated.  Right Atrium: Right atrial size was mildly dilated. Right  atrial pressure is estimated at 10 mmHg.  Interatrial Septum: No atrial level shunt detected by color flow Doppler.  Pericardium: There is no evidence of pericardial effusion.  Mitral Valve: The mitral valve is degenerative in appearance. Moderate thickening of the mitral valve leaflet. There is mild mitral annular calcification present. Mitral valve regurgitation is moderate by color flow Doppler.  Tricuspid Valve: The tricuspid valve is normal in structure. Tricuspid valve regurgitation was not visualized by color flow Doppler.  Aortic Valve: The aortic valve is tricuspid Mild thickening of the aortic valve. Aortic valve regurgitation is mild by color flow Doppler.  Pulmonic Valve: The pulmonic valve was normal in structure. Pulmonic valve regurgitation was not assessed by color flow Doppler.  Venous: The inferior vena cava is normal in size with greater than 50% respiratory variability.   ASSESSMENT AND PLAN:  1.  Hypotension with different dialysis plan will decrease hydralazine to 25 mg TID and if BP still low decrease further.   Would keep BB due to NSVT on monitor and may be difficult to increase further with some bradycardia on monitor.  Keep follow up with Dr. [name].  2.  HTN improved with removing more fluid.   3.  CAD no angina.   Recent hx 01/10/19 PTCA followed by DES PCI with overlapping DES stent (Synergy 2.5 mm x 12 mm - 2.8 mm) and DES PCI (Synergy 2.5 mm x 20 mm--2.7 mm) and recath 02/11/2019 with stable anatomy with no further interventions.   occluded RCA is treated medically  4.  Cardiomyopathy /Chronic combined ChF table today  5.  ESRD on HD T,TH,Sat  6.  Anemia will check CBC today        Current medicines are reviewed with the patient today.  The patient Has no concerns regarding medicines.  The following changes have been made:  See above Labs/ tests ordered today include:see above  Disposition:   FU:  see above  Signed, Cecilie Kicks, NP   04/05/2019 5:18 PM    [facility] [place], [place], [place] Phone: [phone]; Fax: [phone]

## 2019-04-05 ENCOUNTER — Encounter: Payer: Self-pay | Admitting: Cardiology

## 2019-04-05 DIAGNOSIS — D509 Iron deficiency anemia, unspecified: Secondary | ICD-10-CM | POA: Diagnosis not present

## 2019-04-05 DIAGNOSIS — N186 End stage renal disease: Secondary | ICD-10-CM | POA: Diagnosis not present

## 2019-04-05 DIAGNOSIS — D631 Anemia in chronic kidney disease: Secondary | ICD-10-CM | POA: Diagnosis not present

## 2019-04-05 DIAGNOSIS — E8779 Other fluid overload: Secondary | ICD-10-CM | POA: Diagnosis not present

## 2019-04-05 DIAGNOSIS — N2581 Secondary hyperparathyroidism of renal origin: Secondary | ICD-10-CM | POA: Diagnosis not present

## 2019-04-05 LAB — BASIC METABOLIC PANEL
BUN/Creatinine Ratio: 7 — ABNORMAL LOW (ref 12–28)
BUN: 41 mg/dL — ABNORMAL HIGH (ref 8–27)
CO2: 25 mmol/L (ref 20–29)
Calcium: 8.9 mg/dL (ref 8.7–10.3)
Chloride: 95 mmol/L — ABNORMAL LOW (ref 96–106)
Creatinine, Ser: 5.87 mg/dL — ABNORMAL HIGH (ref 0.57–1.00)
GFR calc Af Amer: 7 mL/min/{1.73_m2} — ABNORMAL LOW (ref >59)
GFR calc non Af Amer: 6 mL/min/{1.73_m2} — ABNORMAL LOW (ref >59)
Glucose: 226 mg/dL — ABNORMAL HIGH (ref 65–99)
Potassium: 4.7 mmol/L (ref 3.5–5.2)
Sodium: 135 mmol/L (ref 134–144)

## 2019-04-05 LAB — CBC
Hematocrit: 29.1 % — ABNORMAL LOW (ref 34.0–46.6)
Hemoglobin: 9.4 g/dL — ABNORMAL LOW (ref 11.1–15.9)
MCH: 30.2 pg (ref 26.6–33.0)
MCHC: 32.3 g/dL (ref 31.5–35.7)
MCV: 94 fL (ref 79–97)
Platelets: 237 10*3/uL (ref 150–450)
RBC: 3.11 x10E6/uL — ABNORMAL LOW (ref 3.77–5.28)
RDW: 14.9 % (ref 11.7–15.4)
WBC: 7.3 10*3/uL (ref 3.4–10.8)

## 2019-04-05 MED ORDER — HYDRALAZINE HCL 25 MG PO TABS: 25.0000 mg | ORAL_TABLET | Freq: Three times a day (TID) | ORAL | 3 refills | Status: DC

## 2019-04-06 DIAGNOSIS — N186 End stage renal disease: Secondary | ICD-10-CM | POA: Diagnosis not present

## 2019-04-06 DIAGNOSIS — I132 Hypertensive heart and chronic kidney disease with heart failure and with stage 5 chronic kidney disease, or end stage renal disease: Secondary | ICD-10-CM | POA: Diagnosis not present

## 2019-04-06 DIAGNOSIS — D509 Iron deficiency anemia, unspecified: Secondary | ICD-10-CM | POA: Diagnosis not present

## 2019-04-06 DIAGNOSIS — N2581 Secondary hyperparathyroidism of renal origin: Secondary | ICD-10-CM | POA: Diagnosis not present

## 2019-04-06 DIAGNOSIS — I5043 Acute on chronic combined systolic (congestive) and diastolic (congestive) heart failure: Secondary | ICD-10-CM | POA: Diagnosis not present

## 2019-04-06 DIAGNOSIS — M216X9 Other acquired deformities of unspecified foot: Secondary | ICD-10-CM | POA: Diagnosis not present

## 2019-04-06 DIAGNOSIS — E8779 Other fluid overload: Secondary | ICD-10-CM | POA: Diagnosis not present

## 2019-04-06 DIAGNOSIS — D631 Anemia in chronic kidney disease: Secondary | ICD-10-CM | POA: Diagnosis not present

## 2019-04-06 DIAGNOSIS — E1122 Type 2 diabetes mellitus with diabetic chronic kidney disease: Secondary | ICD-10-CM | POA: Diagnosis not present

## 2019-04-06 DIAGNOSIS — E1151 Type 2 diabetes mellitus with diabetic peripheral angiopathy without gangrene: Secondary | ICD-10-CM | POA: Diagnosis not present

## 2019-04-06 DIAGNOSIS — Z992 Dependence on renal dialysis: Secondary | ICD-10-CM | POA: Diagnosis not present

## 2019-04-06 DIAGNOSIS — J45901 Unspecified asthma with (acute) exacerbation: Secondary | ICD-10-CM | POA: Diagnosis not present

## 2019-04-06 NOTE — Telephone Encounter (Signed)
Called [facility] back for the pts daughter called and said they cannot fill new dose change of hydralazine 25 mg po tid, as ordered on 9/28 by Cecilie Kicks, for the insurance will not cover this until 10/17.  Per the pharmacist at [facility], they will give the pt a coupon card and she will have to pay $13 dollars for an 18 day supply of hydralazine 25 mg po tid.  Per the Pharmacist, on 10/17, the pt can then call and refill her script they have on file to fill, of hydralazine 25 mg po tid, and this will be fully covered and for a 90 day supply. Endorsed this to the pts daughter and she agrees to pay the $13 for 18 day supply of this med, then she will call on 10/17 to [facility] to have them fill the original order for 90 days of hydralazine 25 mg po tid. Informed the pts daughter that the Pharmacist assured me that the 18 day supply they will give the pt today will reflect exactly for her to take hydralazine 25 mg po tid Daughter was very gracious for all the assistance provided.

## 2019-04-06 NOTE — Telephone Encounter (Signed)
1.  Hypotension with different dialysis plan will decrease hydralazine to 25 mg TID and if BP still low decrease further.   Would keep BB due to NSVT on monitor and may be difficult to increase further with some bradycardia on monitor.  Keep follow up with Dr. [name].   As indicated above in Cecilie Kicks NP A&P with the pt on 9/28, she decreased her hydralazine to 25 mg po tid and this was [facility] for a 90 day supply, with confirmed receipt.  Endorsed this to the pts daughter and she states they will go and pick this up today, at the decreased dose of hydralazine 25 mg po tid.  Advised the pts daughter to call me from the pharmacy if there is an issue at all with filling this.  Pts daughter verbalized understanding and agrees with this plan.  Daughter was more than gracious for all the assistance provided.

## 2019-04-07 DIAGNOSIS — N2581 Secondary hyperparathyroidism of renal origin: Secondary | ICD-10-CM | POA: Diagnosis not present

## 2019-04-07 DIAGNOSIS — E785 Hyperlipidemia, unspecified: Secondary | ICD-10-CM | POA: Diagnosis not present

## 2019-04-07 DIAGNOSIS — D631 Anemia in chronic kidney disease: Secondary | ICD-10-CM | POA: Diagnosis not present

## 2019-04-07 DIAGNOSIS — D509 Iron deficiency anemia, unspecified: Secondary | ICD-10-CM | POA: Diagnosis not present

## 2019-04-07 DIAGNOSIS — N186 End stage renal disease: Secondary | ICD-10-CM | POA: Diagnosis not present

## 2019-04-07 MED ORDER — HYDRALAZINE HCL 25 MG PO TABS: 25.0000 mg | ORAL_TABLET | Freq: Two times a day (BID) | ORAL | 1 refills | Status: DC

## 2019-04-07 NOTE — Telephone Encounter (Signed)
Spoke with the pts daughter and informed her that per Cecilie Kicks NP, the pt can now decrease her hydralazine to 25 mg po BID from TID, and Mickel Baas thinks that dialysis is taking off more fluid, which is great, and her BP is decreasing because of this.   Informed the pts daughter that I will reflect med dose change to her pharmacy and put "fill later in the note," for they just picked up a prescription of this medication on 9/28.  Daughter verbalized understanding and agrees with this plan. Daughter was more than gracious for all the assistance provided.

## 2019-04-07 NOTE — Telephone Encounter (Signed)
Monica Serge, NP  Nuala Alpha, LPN        She can decrease hydralazine to 25 mg BID from TID. I think they are taking off more fluid which is great and BP is decreasing because of this. Thanks.    Left a message for the pts daughter to call back to discuss decrease in hydralazine from 25 mg po tid to 25 mg po bid, per Cecilie Kicks NP. Will await daughters callback to make further adjustments.

## 2019-04-07 NOTE — Addendum Note (Signed)
Addended by: Nuala Alpha on: 04/07/2019 03:45 PM   Modules accepted: Orders

## 2019-04-08 DIAGNOSIS — E1151 Type 2 diabetes mellitus with diabetic peripheral angiopathy without gangrene: Secondary | ICD-10-CM | POA: Diagnosis not present

## 2019-04-08 DIAGNOSIS — D631 Anemia in chronic kidney disease: Secondary | ICD-10-CM | POA: Diagnosis not present

## 2019-04-08 DIAGNOSIS — I132 Hypertensive heart and chronic kidney disease with heart failure and with stage 5 chronic kidney disease, or end stage renal disease: Secondary | ICD-10-CM | POA: Diagnosis not present

## 2019-04-08 DIAGNOSIS — E1122 Type 2 diabetes mellitus with diabetic chronic kidney disease: Secondary | ICD-10-CM | POA: Diagnosis not present

## 2019-04-08 DIAGNOSIS — J45901 Unspecified asthma with (acute) exacerbation: Secondary | ICD-10-CM | POA: Diagnosis not present

## 2019-04-08 DIAGNOSIS — M216X9 Other acquired deformities of unspecified foot: Secondary | ICD-10-CM | POA: Diagnosis not present

## 2019-04-08 DIAGNOSIS — N2581 Secondary hyperparathyroidism of renal origin: Secondary | ICD-10-CM | POA: Diagnosis not present

## 2019-04-08 DIAGNOSIS — N186 End stage renal disease: Secondary | ICD-10-CM | POA: Diagnosis not present

## 2019-04-08 DIAGNOSIS — I5043 Acute on chronic combined systolic (congestive) and diastolic (congestive) heart failure: Secondary | ICD-10-CM | POA: Diagnosis not present

## 2019-04-09 DIAGNOSIS — E785 Hyperlipidemia, unspecified: Secondary | ICD-10-CM | POA: Diagnosis not present

## 2019-04-09 DIAGNOSIS — D509 Iron deficiency anemia, unspecified: Secondary | ICD-10-CM | POA: Diagnosis not present

## 2019-04-09 DIAGNOSIS — N2581 Secondary hyperparathyroidism of renal origin: Secondary | ICD-10-CM | POA: Diagnosis not present

## 2019-04-09 DIAGNOSIS — D631 Anemia in chronic kidney disease: Secondary | ICD-10-CM | POA: Diagnosis not present

## 2019-04-09 DIAGNOSIS — N186 End stage renal disease: Secondary | ICD-10-CM | POA: Diagnosis not present

## 2019-04-11 DIAGNOSIS — I132 Hypertensive heart and chronic kidney disease with heart failure and with stage 5 chronic kidney disease, or end stage renal disease: Secondary | ICD-10-CM | POA: Diagnosis not present

## 2019-04-11 DIAGNOSIS — M216X9 Other acquired deformities of unspecified foot: Secondary | ICD-10-CM | POA: Diagnosis not present

## 2019-04-11 DIAGNOSIS — E1151 Type 2 diabetes mellitus with diabetic peripheral angiopathy without gangrene: Secondary | ICD-10-CM | POA: Diagnosis not present

## 2019-04-11 DIAGNOSIS — D631 Anemia in chronic kidney disease: Secondary | ICD-10-CM | POA: Diagnosis not present

## 2019-04-11 DIAGNOSIS — I5043 Acute on chronic combined systolic (congestive) and diastolic (congestive) heart failure: Secondary | ICD-10-CM | POA: Diagnosis not present

## 2019-04-11 DIAGNOSIS — N186 End stage renal disease: Secondary | ICD-10-CM | POA: Diagnosis not present

## 2019-04-11 DIAGNOSIS — J45901 Unspecified asthma with (acute) exacerbation: Secondary | ICD-10-CM | POA: Diagnosis not present

## 2019-04-11 DIAGNOSIS — E1122 Type 2 diabetes mellitus with diabetic chronic kidney disease: Secondary | ICD-10-CM | POA: Diagnosis not present

## 2019-04-11 DIAGNOSIS — N2581 Secondary hyperparathyroidism of renal origin: Secondary | ICD-10-CM | POA: Diagnosis not present

## 2019-04-12 DIAGNOSIS — D509 Iron deficiency anemia, unspecified: Secondary | ICD-10-CM | POA: Diagnosis not present

## 2019-04-12 DIAGNOSIS — N186 End stage renal disease: Secondary | ICD-10-CM | POA: Diagnosis not present

## 2019-04-12 DIAGNOSIS — N2581 Secondary hyperparathyroidism of renal origin: Secondary | ICD-10-CM | POA: Diagnosis not present

## 2019-04-12 DIAGNOSIS — D631 Anemia in chronic kidney disease: Secondary | ICD-10-CM | POA: Diagnosis not present

## 2019-04-12 DIAGNOSIS — E785 Hyperlipidemia, unspecified: Secondary | ICD-10-CM | POA: Diagnosis not present

## 2019-04-13 DIAGNOSIS — N186 End stage renal disease: Secondary | ICD-10-CM | POA: Diagnosis not present

## 2019-04-13 DIAGNOSIS — I5043 Acute on chronic combined systolic (congestive) and diastolic (congestive) heart failure: Secondary | ICD-10-CM | POA: Diagnosis not present

## 2019-04-13 DIAGNOSIS — I132 Hypertensive heart and chronic kidney disease with heart failure and with stage 5 chronic kidney disease, or end stage renal disease: Secondary | ICD-10-CM | POA: Diagnosis not present

## 2019-04-13 DIAGNOSIS — J45901 Unspecified asthma with (acute) exacerbation: Secondary | ICD-10-CM | POA: Diagnosis not present

## 2019-04-13 DIAGNOSIS — E1151 Type 2 diabetes mellitus with diabetic peripheral angiopathy without gangrene: Secondary | ICD-10-CM | POA: Diagnosis not present

## 2019-04-13 DIAGNOSIS — D631 Anemia in chronic kidney disease: Secondary | ICD-10-CM | POA: Diagnosis not present

## 2019-04-13 DIAGNOSIS — N2581 Secondary hyperparathyroidism of renal origin: Secondary | ICD-10-CM | POA: Diagnosis not present

## 2019-04-13 DIAGNOSIS — E1122 Type 2 diabetes mellitus with diabetic chronic kidney disease: Secondary | ICD-10-CM | POA: Diagnosis not present

## 2019-04-13 DIAGNOSIS — M216X9 Other acquired deformities of unspecified foot: Secondary | ICD-10-CM | POA: Diagnosis not present

## 2019-04-14 DIAGNOSIS — E785 Hyperlipidemia, unspecified: Secondary | ICD-10-CM | POA: Diagnosis not present

## 2019-04-14 DIAGNOSIS — E119 Type 2 diabetes mellitus without complications: Secondary | ICD-10-CM | POA: Diagnosis not present

## 2019-04-14 DIAGNOSIS — N186 End stage renal disease: Secondary | ICD-10-CM | POA: Diagnosis not present

## 2019-04-14 DIAGNOSIS — D509 Iron deficiency anemia, unspecified: Secondary | ICD-10-CM | POA: Diagnosis not present

## 2019-04-14 DIAGNOSIS — D631 Anemia in chronic kidney disease: Secondary | ICD-10-CM | POA: Diagnosis not present

## 2019-04-14 DIAGNOSIS — M109 Gout, unspecified: Secondary | ICD-10-CM | POA: Diagnosis not present

## 2019-04-14 DIAGNOSIS — N2581 Secondary hyperparathyroidism of renal origin: Secondary | ICD-10-CM | POA: Diagnosis not present

## 2019-04-16 DIAGNOSIS — D631 Anemia in chronic kidney disease: Secondary | ICD-10-CM | POA: Diagnosis not present

## 2019-04-16 DIAGNOSIS — N186 End stage renal disease: Secondary | ICD-10-CM | POA: Diagnosis not present

## 2019-04-16 DIAGNOSIS — E785 Hyperlipidemia, unspecified: Secondary | ICD-10-CM | POA: Diagnosis not present

## 2019-04-16 DIAGNOSIS — D509 Iron deficiency anemia, unspecified: Secondary | ICD-10-CM | POA: Diagnosis not present

## 2019-04-16 DIAGNOSIS — N2581 Secondary hyperparathyroidism of renal origin: Secondary | ICD-10-CM | POA: Diagnosis not present

## 2019-04-18 DIAGNOSIS — M216X9 Other acquired deformities of unspecified foot: Secondary | ICD-10-CM | POA: Diagnosis not present

## 2019-04-18 DIAGNOSIS — I132 Hypertensive heart and chronic kidney disease with heart failure and with stage 5 chronic kidney disease, or end stage renal disease: Secondary | ICD-10-CM | POA: Diagnosis not present

## 2019-04-18 DIAGNOSIS — J45901 Unspecified asthma with (acute) exacerbation: Secondary | ICD-10-CM | POA: Diagnosis not present

## 2019-04-18 DIAGNOSIS — J45909 Unspecified asthma, uncomplicated: Secondary | ICD-10-CM | POA: Diagnosis not present

## 2019-04-18 DIAGNOSIS — N186 End stage renal disease: Secondary | ICD-10-CM | POA: Diagnosis not present

## 2019-04-18 DIAGNOSIS — D631 Anemia in chronic kidney disease: Secondary | ICD-10-CM | POA: Diagnosis not present

## 2019-04-18 DIAGNOSIS — N2581 Secondary hyperparathyroidism of renal origin: Secondary | ICD-10-CM | POA: Diagnosis not present

## 2019-04-18 DIAGNOSIS — E1151 Type 2 diabetes mellitus with diabetic peripheral angiopathy without gangrene: Secondary | ICD-10-CM | POA: Diagnosis not present

## 2019-04-18 DIAGNOSIS — E1122 Type 2 diabetes mellitus with diabetic chronic kidney disease: Secondary | ICD-10-CM | POA: Diagnosis not present

## 2019-04-18 DIAGNOSIS — I5043 Acute on chronic combined systolic (congestive) and diastolic (congestive) heart failure: Secondary | ICD-10-CM | POA: Diagnosis not present

## 2019-04-19 DIAGNOSIS — N186 End stage renal disease: Secondary | ICD-10-CM | POA: Diagnosis not present

## 2019-04-19 DIAGNOSIS — D631 Anemia in chronic kidney disease: Secondary | ICD-10-CM | POA: Diagnosis not present

## 2019-04-19 DIAGNOSIS — N2581 Secondary hyperparathyroidism of renal origin: Secondary | ICD-10-CM | POA: Diagnosis not present

## 2019-04-21 DIAGNOSIS — D631 Anemia in chronic kidney disease: Secondary | ICD-10-CM | POA: Diagnosis not present

## 2019-04-21 DIAGNOSIS — N2581 Secondary hyperparathyroidism of renal origin: Secondary | ICD-10-CM | POA: Diagnosis not present

## 2019-04-21 DIAGNOSIS — N186 End stage renal disease: Secondary | ICD-10-CM | POA: Diagnosis not present

## 2019-04-22 ENCOUNTER — Other Ambulatory Visit: Payer: Self-pay

## 2019-04-22 DIAGNOSIS — D631 Anemia in chronic kidney disease: Secondary | ICD-10-CM | POA: Diagnosis not present

## 2019-04-22 DIAGNOSIS — M216X9 Other acquired deformities of unspecified foot: Secondary | ICD-10-CM | POA: Diagnosis not present

## 2019-04-22 DIAGNOSIS — E1122 Type 2 diabetes mellitus with diabetic chronic kidney disease: Secondary | ICD-10-CM | POA: Diagnosis not present

## 2019-04-22 DIAGNOSIS — N186 End stage renal disease: Secondary | ICD-10-CM | POA: Diagnosis not present

## 2019-04-22 DIAGNOSIS — J45901 Unspecified asthma with (acute) exacerbation: Secondary | ICD-10-CM | POA: Diagnosis not present

## 2019-04-22 DIAGNOSIS — E1151 Type 2 diabetes mellitus with diabetic peripheral angiopathy without gangrene: Secondary | ICD-10-CM | POA: Diagnosis not present

## 2019-04-22 DIAGNOSIS — N2581 Secondary hyperparathyroidism of renal origin: Secondary | ICD-10-CM | POA: Diagnosis not present

## 2019-04-22 DIAGNOSIS — I132 Hypertensive heart and chronic kidney disease with heart failure and with stage 5 chronic kidney disease, or end stage renal disease: Secondary | ICD-10-CM | POA: Diagnosis not present

## 2019-04-22 DIAGNOSIS — I5043 Acute on chronic combined systolic (congestive) and diastolic (congestive) heart failure: Secondary | ICD-10-CM | POA: Diagnosis not present

## 2019-04-22 NOTE — Patient Outreach (Signed)
[facility]) Care Management  04/22/2019  Ashelynn Vanbramer May 03, 1931 [id number]   Telephone Assessment    Outreach attempt #1 to patient. Spoke with patient who denies any acute issues or concerns at present. She voices that things have been going fairly well. Patient reports that she is feeling better. She denies any pain or SOB. Patient voices that her breathing has been better.She went to cardiology appt a few weeks ago an got a good report. She last saw PCP about 2-3 months and sess about every 6 moths. Patient goes to see Dr. [name] on 05/02/2019. She voices that her son will be taking her. Her children are very supportive and able to assist her as needed. She got flu shot during last hospitalization. She denies any RN CM needs or concerns at this time. Patient aware that she can call at any time for any needs or concerns.     Plan: RN CM will make outreach attempt to patient within one month.    Enzo Montgomery, RN,BSN,CCM [facility] Telephonic Care Management Coordinator Direct Phone: [phone] Toll Free: [phone] Fax: [phone]

## 2019-04-23 DIAGNOSIS — N2581 Secondary hyperparathyroidism of renal origin: Secondary | ICD-10-CM | POA: Diagnosis not present

## 2019-04-23 DIAGNOSIS — N186 End stage renal disease: Secondary | ICD-10-CM | POA: Diagnosis not present

## 2019-04-23 DIAGNOSIS — D631 Anemia in chronic kidney disease: Secondary | ICD-10-CM | POA: Diagnosis not present

## 2019-04-25 ENCOUNTER — Ambulatory Visit: Payer: Medicare HMO | Admitting: *Deleted

## 2019-04-25 ENCOUNTER — Ambulatory Visit: Payer: Medicare HMO

## 2019-04-26 DIAGNOSIS — N186 End stage renal disease: Secondary | ICD-10-CM | POA: Diagnosis not present

## 2019-04-26 DIAGNOSIS — D631 Anemia in chronic kidney disease: Secondary | ICD-10-CM | POA: Diagnosis not present

## 2019-04-26 DIAGNOSIS — N2581 Secondary hyperparathyroidism of renal origin: Secondary | ICD-10-CM | POA: Diagnosis not present

## 2019-04-27 DIAGNOSIS — N2581 Secondary hyperparathyroidism of renal origin: Secondary | ICD-10-CM | POA: Diagnosis not present

## 2019-04-27 DIAGNOSIS — I132 Hypertensive heart and chronic kidney disease with heart failure and with stage 5 chronic kidney disease, or end stage renal disease: Secondary | ICD-10-CM | POA: Diagnosis not present

## 2019-04-27 DIAGNOSIS — M216X9 Other acquired deformities of unspecified foot: Secondary | ICD-10-CM | POA: Diagnosis not present

## 2019-04-27 DIAGNOSIS — D631 Anemia in chronic kidney disease: Secondary | ICD-10-CM | POA: Diagnosis not present

## 2019-04-27 DIAGNOSIS — J45901 Unspecified asthma with (acute) exacerbation: Secondary | ICD-10-CM | POA: Diagnosis not present

## 2019-04-27 DIAGNOSIS — E1122 Type 2 diabetes mellitus with diabetic chronic kidney disease: Secondary | ICD-10-CM | POA: Diagnosis not present

## 2019-04-27 DIAGNOSIS — I5043 Acute on chronic combined systolic (congestive) and diastolic (congestive) heart failure: Secondary | ICD-10-CM | POA: Diagnosis not present

## 2019-04-27 DIAGNOSIS — E1151 Type 2 diabetes mellitus with diabetic peripheral angiopathy without gangrene: Secondary | ICD-10-CM | POA: Diagnosis not present

## 2019-04-27 DIAGNOSIS — N186 End stage renal disease: Secondary | ICD-10-CM | POA: Diagnosis not present

## 2019-04-28 DIAGNOSIS — N2581 Secondary hyperparathyroidism of renal origin: Secondary | ICD-10-CM | POA: Diagnosis not present

## 2019-04-28 DIAGNOSIS — N186 End stage renal disease: Secondary | ICD-10-CM | POA: Diagnosis not present

## 2019-04-28 DIAGNOSIS — D631 Anemia in chronic kidney disease: Secondary | ICD-10-CM | POA: Diagnosis not present

## 2019-04-28 DIAGNOSIS — E8779 Other fluid overload: Secondary | ICD-10-CM | POA: Diagnosis not present

## 2019-04-29 ENCOUNTER — Other Ambulatory Visit: Payer: Medicare HMO | Admitting: *Deleted

## 2019-04-29 ENCOUNTER — Other Ambulatory Visit: Payer: Self-pay

## 2019-04-29 DIAGNOSIS — I132 Hypertensive heart and chronic kidney disease with heart failure and with stage 5 chronic kidney disease, or end stage renal disease: Secondary | ICD-10-CM | POA: Diagnosis not present

## 2019-04-29 DIAGNOSIS — J45901 Unspecified asthma with (acute) exacerbation: Secondary | ICD-10-CM | POA: Diagnosis not present

## 2019-04-29 DIAGNOSIS — N2581 Secondary hyperparathyroidism of renal origin: Secondary | ICD-10-CM | POA: Diagnosis not present

## 2019-04-29 DIAGNOSIS — D631 Anemia in chronic kidney disease: Secondary | ICD-10-CM | POA: Diagnosis not present

## 2019-04-29 DIAGNOSIS — I2510 Atherosclerotic heart disease of native coronary artery without angina pectoris: Secondary | ICD-10-CM

## 2019-04-29 DIAGNOSIS — I5043 Acute on chronic combined systolic (congestive) and diastolic (congestive) heart failure: Secondary | ICD-10-CM | POA: Diagnosis not present

## 2019-04-29 DIAGNOSIS — H401123 Primary open-angle glaucoma, left eye, severe stage: Secondary | ICD-10-CM | POA: Diagnosis not present

## 2019-04-29 DIAGNOSIS — E1122 Type 2 diabetes mellitus with diabetic chronic kidney disease: Secondary | ICD-10-CM | POA: Diagnosis not present

## 2019-04-29 DIAGNOSIS — E1151 Type 2 diabetes mellitus with diabetic peripheral angiopathy without gangrene: Secondary | ICD-10-CM | POA: Diagnosis not present

## 2019-04-29 DIAGNOSIS — E785 Hyperlipidemia, unspecified: Secondary | ICD-10-CM | POA: Diagnosis not present

## 2019-04-29 DIAGNOSIS — M216X9 Other acquired deformities of unspecified foot: Secondary | ICD-10-CM | POA: Diagnosis not present

## 2019-04-29 DIAGNOSIS — N186 End stage renal disease: Secondary | ICD-10-CM | POA: Diagnosis not present

## 2019-04-29 DIAGNOSIS — H401111 Primary open-angle glaucoma, right eye, mild stage: Secondary | ICD-10-CM | POA: Diagnosis not present

## 2019-04-29 LAB — HEPATIC FUNCTION PANEL
ALT: 15 IU/L (ref 0–32)
AST: 20 IU/L (ref 0–40)
Albumin: 3.6 g/dL (ref 3.6–4.6)
Alkaline Phosphatase: 97 IU/L (ref 39–117)
Bilirubin Total: 0.2 mg/dL (ref 0.0–1.2)
Bilirubin, Direct: 0.08 mg/dL (ref 0.00–0.40)
Total Protein: 6.7 g/dL (ref 6.0–8.5)

## 2019-04-29 LAB — LIPID PANEL
Chol/HDL Ratio: 2.8 ratio (ref 0.0–4.4)
Cholesterol, Total: 132 mg/dL (ref 100–199)
HDL: 48 mg/dL (ref >39)
LDL Chol Calc (NIH): 68 mg/dL (ref 0–99)
Triglycerides: 84 mg/dL (ref 0–149)
VLDL Cholesterol Cal: 16 mg/dL (ref 5–40)

## 2019-04-30 DIAGNOSIS — D631 Anemia in chronic kidney disease: Secondary | ICD-10-CM | POA: Diagnosis not present

## 2019-04-30 DIAGNOSIS — N186 End stage renal disease: Secondary | ICD-10-CM | POA: Diagnosis not present

## 2019-04-30 DIAGNOSIS — E8779 Other fluid overload: Secondary | ICD-10-CM | POA: Diagnosis not present

## 2019-04-30 DIAGNOSIS — N2581 Secondary hyperparathyroidism of renal origin: Secondary | ICD-10-CM | POA: Diagnosis not present

## 2019-05-02 ENCOUNTER — Other Ambulatory Visit: Payer: Self-pay

## 2019-05-02 ENCOUNTER — Ambulatory Visit (INDEPENDENT_AMBULATORY_CARE_PROVIDER_SITE_OTHER): Payer: Medicare HMO | Admitting: Cardiology

## 2019-05-02 ENCOUNTER — Encounter: Payer: Self-pay | Admitting: Physician Assistant

## 2019-05-02 ENCOUNTER — Encounter: Payer: Self-pay | Admitting: Cardiology

## 2019-05-02 VITALS — BP 140/70 | HR 76 | Ht 62.0 in | Wt 163.0 lb

## 2019-05-02 DIAGNOSIS — I959 Hypotension, unspecified: Secondary | ICD-10-CM

## 2019-05-02 DIAGNOSIS — I25708 Atherosclerosis of coronary artery bypass graft(s), unspecified, with other forms of angina pectoris: Secondary | ICD-10-CM

## 2019-05-02 DIAGNOSIS — J45901 Unspecified asthma with (acute) exacerbation: Secondary | ICD-10-CM | POA: Diagnosis not present

## 2019-05-02 DIAGNOSIS — N2581 Secondary hyperparathyroidism of renal origin: Secondary | ICD-10-CM | POA: Diagnosis not present

## 2019-05-02 DIAGNOSIS — D631 Anemia in chronic kidney disease: Secondary | ICD-10-CM | POA: Diagnosis not present

## 2019-05-02 DIAGNOSIS — E1122 Type 2 diabetes mellitus with diabetic chronic kidney disease: Secondary | ICD-10-CM | POA: Diagnosis not present

## 2019-05-02 DIAGNOSIS — I10 Essential (primary) hypertension: Secondary | ICD-10-CM

## 2019-05-02 DIAGNOSIS — M216X9 Other acquired deformities of unspecified foot: Secondary | ICD-10-CM | POA: Diagnosis not present

## 2019-05-02 DIAGNOSIS — E1151 Type 2 diabetes mellitus with diabetic peripheral angiopathy without gangrene: Secondary | ICD-10-CM | POA: Diagnosis not present

## 2019-05-02 DIAGNOSIS — I132 Hypertensive heart and chronic kidney disease with heart failure and with stage 5 chronic kidney disease, or end stage renal disease: Secondary | ICD-10-CM | POA: Diagnosis not present

## 2019-05-02 DIAGNOSIS — N186 End stage renal disease: Secondary | ICD-10-CM

## 2019-05-02 DIAGNOSIS — I5043 Acute on chronic combined systolic (congestive) and diastolic (congestive) heart failure: Secondary | ICD-10-CM | POA: Diagnosis not present

## 2019-05-02 NOTE — Patient Instructions (Signed)
Medication Instructions:   Your physician recommends that you continue on your current medications as directed. Please refer to the Current Medication list given to you today.  *If you need a refill on your cardiac medications before your next appointment, please call your pharmacy*    Follow-Up: At [facility], you and your health needs are our priority.  As part of our continuing mission to provide you with exceptional heart care, we have created designated Provider Care Teams.  These Care Teams include your primary Cardiologist (physician) and Advanced Practice Providers (APPs -  Physician Assistants and Nurse Practitioners) who all work together to provide you with the care you need, when you need it.  Your next appointment:   3 months  The format for your next appointment:    In Person   Provider:   Cecilie Kicks NP

## 2019-05-02 NOTE — Progress Notes (Signed)
Cardiology Office Note   Date:  05/02/2019   ID:  Monica [name], DOB [birth year], MRN [id number]  PCP:  Benito Mccreedy, MD  Cardiologist:    Dr. [name]  No chief complaint on file.     History of Present Illness: Monica [name] is a 85 y.o. female with h/o CAD (s/p CABG 2017, NSTEMI 01/2019 with PCI), chronic combined CHF, moderate mitral regurgitation, anemia, bradycardia/sinus node dysfunction, ESRD on HD TThS, GERD, hyperlipidemia and diabetes type 2.  The patient was admitted to the hospital in early July for NSTEMI([id number]) 01/07/2019 with cath showing occluded/atretic SVG to the distal RCA, severe proximal to stent and in-stent restenosis of SVG to OM with successful PTCA/with overlapping DES stent and successful DES to the ostial proximal SVG to the RI.Echo showed new reduced LVEF 30 to 35% with moderately dilated right and left atrium moderate MR and mild AI. She was placed on ASA, Plaix and metoprolol. She was readmitted in mid-July with fatigue and dizziness with junctional bradycardia HR in the 40s, therefore metoprolol was discontinued. She was then again readmitted 02/10/19 with chest pain and elevated troponin (peak 264). Nuclear stress test was abnormal, prompting repeat cath with findings above - felt to be stable disease, no focal targets for PCI. Imdur was titrated with consideration for addition of Ranexa although it does not appear this medicine has been studied in dialysis patients.She wasre-[place] low dose carvedilolfor antianginal effect.She is not on ACEI/ARB/ARNI/spiro due to renal dysfunction. Last labs 02/2019 showed Hgb 10.6, LDL 63, K 4.5, Cr 4.61, TSH wnl.  At office visit 02/18/2019 the patient reported having felt well after discharge but during a dialysis session the prior day she had an episode of chest pain and EMS was called.  Blood pressure was reportedly 198 from dialysis notes.  The patient took 2 sublingual nitroglycerin with relief and declined  to be transported to the hospital.  She went home and rested and felt better.  Blood pressure remained above goal.  Imdur and hydralazine were increased.  It was noted that at some point amlodipine was stopped, unclear reason.  The patient return 03/15/19 for close follow-up of blood pressure and chest pain with her daughter. She is unable to take her BP meds prior to dialysis due to dropping BP with dialysis. She is taking her hydralazine right after dialysis.   Event monitor.:  Sinus bradycardia to sinus rhythm. 1. AVB.  Few short runs of nsVT, the longest lasting 6 beats.   No pauses or atrial fibrillation.   She was hospitalized 03/17/19 for volume overload, her troponins HS were in the 51 and 27  She had dialysis extra day and her imdur she was to take half prior to dialysis.   Since discharge,  She has done well with no SOB or chest pain.  Weight is slightly up from discharge.  It may be she is having more volume removed.  Lower EDW for post dialysis. So now BP is lower.  She was symptomatic with lower BP to 0000000 systolic this AM and she feels bad after taking her meds after dialysis.       05/02/2019 -patient is coming after 1 months, she now reports she has been doing much better, her current medication combination is working well she has no episodes of hypotension during dialysis, no dizziness, she denies any lower extremity edema, orthopnea or proximal nocturnal dyspnea.  No chest pain and has stable dyspnea on moderate exertion.    Past Medical History:  Diagnosis Date  . Anemia   . Asthma   . Chronic combined systolic and diastolic CHF (congestive heart failure) ([place])    a. Echo 11/19 [facility]):  EF 55-65, normal wall motion, normal diastolic function. b. Echo 01/2019 EF 30-35%, moderate MR, mild AI.  Marland Kitchen Coronary artery disease    a. s/p CABG 2017. b.s/p NSTEMI 11/19 [facility]) >> S-PDA 100 >>PCI: 2.25 x 24 mm Synergy DES to S-OM. c. NSTEMI 01/2019 - occluded SVG-distal  RCA, severe proximal to stent/ISR of SVG-OM s/p overlapping DES, DES to ostial prox SVG to the RI.   Marland Kitchen ESRD (end stage renal disease)    Dialysis Tu, Th, Sa  . Essential hypertension   . Gastroesophageal reflux disease   . History of blood transfusion   . Hyperlipidemia   . Junctional bradycardia   . Moderate mitral regurgitation   . Peptic ulcer   . Protein calorie malnutrition ([place])   . Sinus node dysfunction (H[facility])   . Type 2 diabetes mellitus ([place])     Past Surgical History:  Procedure Laterality Date  . ABDOMINAL HYSTERECTOMY    . CARDIAC CATHETERIZATION N/A 09/17/2015   Procedure: Left Heart Cath and Coronary Angiography;  Surgeon: Jettie Booze, MD;  Location: [place] CV LAB;  Service: Cardiovascular;  Laterality: N/A;  . COLON SURGERY    . CORONARY ARTERY BYPASS GRAFT N/A 09/19/2015   Procedure: CORONARY ARTERY BYPASS GRAFTING (CABG) TIMES FOUR USING BILATARAL SAPHENOUS VEIN GRAFTS AND LEFT INTERNAL MAMMARY ARTERY;  Surgeon: [name], MD;  Location: [place];  Service: Open Heart Surgery;  Laterality: N/A;  . CORONARY STENT INTERVENTION N/A 01/10/2019   Procedure: CORONARY STENT INTERVENTION;  Surgeon: Leonie Man, MD;  Location: [place] CV LAB;  Service: Cardiovascular;  Laterality: N/A;  . LEFT HEART CATH AND CORS/GRAFTS ANGIOGRAPHY N/A 01/10/2019   Procedure: LEFT HEART CATH AND CORS/GRAFTS ANGIOGRAPHY;  Surgeon: Leonie Man, MD;  Location: [place] CV LAB;  Service: Cardiovascular;  Laterality: N/A;  . LEFT HEART CATH AND CORS/GRAFTS ANGIOGRAPHY N/A 02/11/2019   Procedure: LEFT HEART CATH AND CORS/GRAFTS ANGIOGRAPHY;  Surgeon: Burnell Blanks, MD;  Location: [place] CV LAB;  Service: Cardiovascular;  Laterality: N/A;  . TEE WITHOUT CARDIOVERSION N/A 09/19/2015   Procedure: TRANSESOPHAGEAL ECHOCARDIOGRAM (TEE);  Surgeon: [name], MD;  Location: [place];  Service: Open Heart Surgery;  Laterality: N/A;     Current Outpatient  Medications  Medication Sig Dispense Refill  . acetaminophen (TYLENOL) 500 MG tablet Take 1 tablet (500 mg total) by mouth every 6 (six) hours as needed for mild pain or headache. 30 tablet 0  . albuterol (PROVENTIL HFA;VENTOLIN HFA) 108 (90 Base) MCG/ACT inhaler Inhale 2 puffs into the lungs every 6 (six) hours as needed for wheezing or shortness of breath.    . allopurinol (ZYLOPRIM) 100 MG tablet Take 100 mg by mouth 2 (two) times daily.     Marland Kitchen aspirin EC 81 MG EC tablet Take 1 tablet (81 mg total) by mouth daily.    Marland Kitchen atorvastatin (LIPITOR) 80 MG tablet Take 80 mg by mouth daily at 6 PM.     . brimonidine (ALPHAGAN P) 0.1 % SOLN Place 1 drop into both eyes 2 (two) times daily.    . carvedilol (COREG) 12.5 MG tablet Take 1 tablet (12.5 mg total) by mouth 2 (two) times daily. 180 tablet 3  . Cholecalciferol (VITAMIN D-3) 1000 units CAPS Take 1,000 Units by mouth  daily.     . clopidogrel (PLAVIX) 75 MG tablet Take 75 mg by mouth daily.     . diphenhydrAMINE (BENADRYL) 25 MG tablet Take 25 mg by mouth as needed for itching or allergies.     . ferric citrate (AURYXIA) 1 GM 210 MG(Fe) tablet Take 210 mg by mouth 3 (three) times daily with meals.     . hydrALAZINE (APRESOLINE) 25 MG tablet Take 1 tablet (25 mg total) by mouth 2 (two) times daily. 180 tablet 1  . isosorbide mononitrate (IMDUR) 60 MG 24 hr tablet Take 120 mg on Monday, Wednesday, Friday and Sunday.   Take 60 mg on (Tuesday, Thursday and Saturday)  Prior  to Dialysis and 60 mg After dialysis/ 120 tablet 0  . latanoprost (XALATAN) 0.005 % ophthalmic solution Place 1 drop into both eyes at bedtime.    Marland Kitchen loratadine (CLARITIN) 10 MG tablet Take 10 mg by mouth as needed for allergies.     . Melatonin 5 MG CAPS Take 5 mg by mouth at bedtime.     . multivitamin (RENA-VIT) TABS tablet Take 1 tablet by mouth daily.    . nitroGLYCERIN (NITROSTAT) 0.4 MG SL tablet Place 1 tablet (0.4 mg total) under the tongue every 5 (five) minutes as needed for  chest pain. 25 tablet 12  . pantoprazole (PROTONIX) 20 MG tablet Take 20 mg by mouth daily.     No current facility-administered medications for this visit.     Allergies:   Patient has no known allergies.    Social History:  The patient  reports that she has never smoked. She has quit using smokeless tobacco.  Her smokeless tobacco use included snuff. She reports that she does not drink alcohol or use drugs.   Family History:  The patient's family history includes Heart attack in her father.    ROS:  General:no colds or fevers, no weight changes Skin:no rashes or ulcers HEENT:no blurred vision, no congestion CV:see HPI PUL:see HPI GI:no diarrhea constipation or melena, no indigestion GU:no hematuria, no dysuria MS:no joint pain, no claudication Neuro:no syncope, no lightheadedness Endo:+ diabetes, no thyroid disease  Wt Readings from Last 3 Encounters:  05/02/19 163 lb (73.9 kg)  04/04/19 159 lb 6.4 oz (72.3 kg)  03/19/19 152 lb 1.9 oz (69 kg)     PHYSICAL EXAM: VS:  BP 140/70   Pulse 76   Ht 5\' 2"  (1.575 m)   Wt 163 lb (73.9 kg)   BMI 29.81 kg/m  , BMI Body mass index is 29.81 kg/m. General:Pleasant affect, NAD Skin:Warm and dry, brisk capillary refill HEENT:normocephalic, sclera clear, mucus membranes moist Neck:supple, no JVD, no bruits  Heart:S1S2 RRR without murmur, gallup, rub or click Lungs:clear without rales, rhonchi, or wheezes [id number], non tender, + BS, do not palpate liver spleen or masses Ext:no lower ext edema, 2+ pedal pulses, 2+ radial pulses Neuro:alert and oriented X 3, MAE, follows commands, + facial symmetry    EKG:  EKG is ordered today. The ekg ordered today demonstrates SR - normal EKG no changes.   Recent Labs: 02/10/2019: TSH 2.019 03/17/2019: B Natriuretic Peptide 2,492.2 04/04/2019: BUN 41; Creatinine, Ser 5.87; Hemoglobin 9.4; Platelets 237; Potassium 4.7; Sodium 135 04/29/2019: ALT 15    Lipid Panel    Component Value  Date/Time   CHOL 132 04/29/2019 0940   TRIG 84 04/29/2019 0940   HDL 48 04/29/2019 0940   CHOLHDL 2.8 04/29/2019 0940   CHOLHDL 2.4 02/11/2019 0536   VLDL 9  02/11/2019 0536   [place] 68 04/29/2019 0940       Other studies Reviewed: Additional studies/ records that were reviewed today include: .  Cardiac cath 02/11/19  Dist LM to Prox LAD lesion is 30% stenosed.  Mid LAD-1 lesion is 95% stenosed.  Mid LAD-2 lesion is 100% stenosed.  Ost Cx to Mid Cx lesion is 100% stenosed.  Prox RCA lesion is 70% stenosed.  Prox RCA to Mid RCA lesion is 90% stenosed.  Mid RCA lesion is 95% stenosed.  Dist RCA lesion is 100% stenosed with 100% stenosed side branch in RPAV.  Ramus lesion is 100% stenosed.  LIMA and is normal in caliber.  There is no competitive flow  SVG.  The graft exhibits severe diffuse disease.  Origin to Prox Graft lesion is 90% stenosed.  Prox Graft to Mid Graft lesion is 80% stenosed.  Dist Graft lesion is 100% stenosed.  SVG.  SVG.  Previously placed Origin to Prox Graft stent (unknown type) is widely patent.  Previously placed Dist Graft stent (unknown type) is widely patent.  1. Chronic occlusion of the LAD is known. The LIMA to the LAD is patent 2. Chronic occlusion of the Circumflex is known. The SVG to the ramus intermediate is patent with patent proximal stent.  The SVG to the OM is patent with patent distal body stent.  3. Chronic occlusion mid RCA. The SVG to the RCA is known to be occluded (not selectively engaged today).   Recommendations: No focal targets for PCI. Continue medical management. Consider addition of Ranexa.  Diagnostic Dominance: Right   Cardiac cath [id number]  LV end diastolic pressure is normal.  Dist LM to Prox LAD lesion is 30% stenosed.  Mid LAD-1 lesion is 95% stenosed. Mid LAD-2 lesion is 100% stenosed.  Ramus lesion is 100% stenosed.  Ost Cx to Mid Cx lesion is 100% stenosed.  Prox RCA lesion is  70% stenosed. Prox RCA to Mid RCA lesion is 90% stenosed. Mid RCA lesion is 95% stenosed. Dist RCA lesion is 100% stenosed with 100% stenosed side branch in RPAV.  -------------GRAFTS--------------  LIMA-LAD graft was visualized by angiography and is normal in caliber. There is no competitive flow  SVG-dRCA graft was visualized by angiography. The graft exhibits severe diffuse disease. Origin to Prox Graft lesion is 90% stenosed. Prox Graft to Mid Graft lesion is 80% stenosed follwed by Dist Graft lesion is 100% stenosed.  -----------INTERVENTION----------------  SVG-OM graft was visualized by angiography.  Mid Graft lesion is 75% stenosed.  A drug-eluting stent was successfully placed using a STENT SYNERGY DES 2.5X12. Postdilated 2.8 mm  ---------------------------  Mid Graft to Dist Graft Stent is 85% stenosed. Balloon angioplasty was performed using a BALLOON [place].  Post intervention, there is a 0% residual stenosis -in both the new stent, stent overlap in the previous stent.  SVG-RI graft was visualized by angiography. Origin to Prox Graft lesion is 85% stenosed.  A drug-eluting stent was successfully placed using a STENT SYNERGY DES 2.5X20. -Postdilated to 2.7 mm  Post intervention, there is a 0% residual stenosis.  SUMMARY  Severe native vessel CAD: 100% ostial Circumflex (LCx) and ostial Ramus Intermedius, diffuse calcified proximal LAD followed by 95% stenosis just after 1st Diag followed by 100% -all CTO; severe diffuse proximal to mid RCA disease with CTO of the distal RCA  Essentially occluded/atretic SVG-dRCA  Severe proximal to stent and in-stent restenosis in SVG-OM ? Successful PTCA followed by DES PCI with overlapping DES  stent (Synergy 2.5 mm x 12 mm - 2.8 mm)  Severe ostial-proximal SVG-RI ? Successful DES PCI (Synergy 2.5 mm x 20 mm--2.7 mm)  Normal LVEDP despite significant systemic hypertension   RECOMMENDATIONS  Transfer to  cardiology nursing unit for post PCI care after sheath removal in PACU holding area  Aggressive restricted medication with blood pressure management  Dialysis as indicated  We will need to treat occluded RCA system medically  Echo 01/08/2019 IMPRESSIONS   1. The left ventricle has moderate-severely reduced systolic function, with an ejection fraction of 30-35%. The cavity size was normal. There is moderate concentric left ventricular hypertrophy. Left ventricular diastolic Doppler parameters are  consistent with pseudonormalization. Elevated left atrial and left ventricular end-diastolic pressures. 2. The right ventricle has moderately reduced systolic function. The cavity was normal. There is no increase in right ventricular wall thickness. Right ventricular systolic pressure is normal. 3. Left atrial size was moderately dilated. 4. Right atrial size was mildly dilated. 5. The mitral valve is degenerative. Moderate thickening of the mitral valve leaflet. There is mild mitral annular calcification present. Mitral valve regurgitation is moderate by color flow Doppler. 6. The aortic valve is tricuspid. Mild thickening of the aortic valve. Aortic valve regurgitation is mild by color flow Doppler.  SUMMARY  When compared to the prior study there is now new LV dysfunction with diffuse hypokinesis and LVEF 30-35%, RVEF is also moderately decreased. LVEDP is elevated.Mitral regurgitation is at least moderate. FINDINGS Left Ventricle: The left ventricle has moderate-severely reduced systolic function, with an ejection fraction of 30-35%. The cavity size was normal. There is moderate concentric left ventricular hypertrophy. Left ventricular diastolic Doppler parameters are consistent with pseudonormalization. Elevated left atrial and left ventricular end-diastolic pressures  Right Ventricle: The right ventricle has moderately reduced systolic function. The cavity was normal. There  is no increase in right ventricular wall thickness. Right ventricular systolic pressure is normal.  Left Atrium: Left atrial size was moderately dilated.  Right Atrium: Right atrial size was mildly dilated. Right atrial pressure is estimated at 10 mmHg.  Interatrial Septum: No atrial level shunt detected by color flow Doppler.  Pericardium: There is no evidence of pericardial effusion.  Mitral Valve: The mitral valve is degenerative in appearance. Moderate thickening of the mitral valve leaflet. There is mild mitral annular calcification present. Mitral valve regurgitation is moderate by color flow Doppler.  Tricuspid Valve: The tricuspid valve is normal in structure. Tricuspid valve regurgitation was not visualized by color flow Doppler.  Aortic Valve: The aortic valve is tricuspid Mild thickening of the aortic valve. Aortic valve regurgitation is mild by color flow Doppler.  Pulmonic Valve: The pulmonic valve was normal in structure. Pulmonic valve regurgitation was not assessed by color flow Doppler.  Venous: The inferior vena cava is normal in size with greater than 50% respiratory variability.   ASSESSMENT AND PLAN:  1.  Hypotension with hemodialysis -this has resolved after decreasing hydralazine to 25 mg TID. Would keep BB due to NSVT on monitor and may be difficult to increase further with some bradycardia on monitor.  Keep follow up with Dr. [name].  2.  HTN controlled, less variability now  3.  CAD no angina.   Recent hx 01/10/19 PTCA followed by DES PCI with overlapping DES stent (Synergy 2.5 mm x 12 mm - 2.8 mm) and DES PCI (Synergy 2.5 mm x 20 mm--2.7 mm) and recath 02/11/2019 with stable anatomy with no further interventions.   occluded RCA is treated medically.  She is now asymptomatic.  4.  Cardiomyopathy /Chronic combined ChF table today  5.  ESRD on HD T,TH,Sat  6.  Anemia -most recent hemoglobin on 928 2029.4.   Current medicines are reviewed with the  patient today.  The patient Has no concerns regarding medicines.  The following changes have been made:  See above Labs/ tests ordered today include:see above  Disposition:   FU:  see above  Signed, Ena Dawley, MD  05/02/2019 2:14 PM    [facility] [place], [place], [address] [place], [place] Phone: [phone]; Fax: [phone]

## 2019-05-03 DIAGNOSIS — E8779 Other fluid overload: Secondary | ICD-10-CM | POA: Diagnosis not present

## 2019-05-03 DIAGNOSIS — D631 Anemia in chronic kidney disease: Secondary | ICD-10-CM | POA: Diagnosis not present

## 2019-05-03 DIAGNOSIS — N2581 Secondary hyperparathyroidism of renal origin: Secondary | ICD-10-CM | POA: Diagnosis not present

## 2019-05-03 DIAGNOSIS — N186 End stage renal disease: Secondary | ICD-10-CM | POA: Diagnosis not present

## 2019-05-04 DIAGNOSIS — E1151 Type 2 diabetes mellitus with diabetic peripheral angiopathy without gangrene: Secondary | ICD-10-CM | POA: Diagnosis not present

## 2019-05-04 DIAGNOSIS — N186 End stage renal disease: Secondary | ICD-10-CM | POA: Diagnosis not present

## 2019-05-04 DIAGNOSIS — D631 Anemia in chronic kidney disease: Secondary | ICD-10-CM | POA: Diagnosis not present

## 2019-05-04 DIAGNOSIS — J45901 Unspecified asthma with (acute) exacerbation: Secondary | ICD-10-CM | POA: Diagnosis not present

## 2019-05-04 DIAGNOSIS — N2581 Secondary hyperparathyroidism of renal origin: Secondary | ICD-10-CM | POA: Diagnosis not present

## 2019-05-04 DIAGNOSIS — I5043 Acute on chronic combined systolic (congestive) and diastolic (congestive) heart failure: Secondary | ICD-10-CM | POA: Diagnosis not present

## 2019-05-04 DIAGNOSIS — I132 Hypertensive heart and chronic kidney disease with heart failure and with stage 5 chronic kidney disease, or end stage renal disease: Secondary | ICD-10-CM | POA: Diagnosis not present

## 2019-05-04 DIAGNOSIS — E1122 Type 2 diabetes mellitus with diabetic chronic kidney disease: Secondary | ICD-10-CM | POA: Diagnosis not present

## 2019-05-04 DIAGNOSIS — M216X9 Other acquired deformities of unspecified foot: Secondary | ICD-10-CM | POA: Diagnosis not present

## 2019-05-04 DIAGNOSIS — E8779 Other fluid overload: Secondary | ICD-10-CM | POA: Diagnosis not present

## 2019-05-05 DIAGNOSIS — E8779 Other fluid overload: Secondary | ICD-10-CM | POA: Diagnosis not present

## 2019-05-05 DIAGNOSIS — D631 Anemia in chronic kidney disease: Secondary | ICD-10-CM | POA: Diagnosis not present

## 2019-05-05 DIAGNOSIS — N186 End stage renal disease: Secondary | ICD-10-CM | POA: Diagnosis not present

## 2019-05-05 DIAGNOSIS — N2581 Secondary hyperparathyroidism of renal origin: Secondary | ICD-10-CM | POA: Diagnosis not present

## 2019-05-07 DIAGNOSIS — N186 End stage renal disease: Secondary | ICD-10-CM | POA: Diagnosis not present

## 2019-05-07 DIAGNOSIS — E8779 Other fluid overload: Secondary | ICD-10-CM | POA: Diagnosis not present

## 2019-05-07 DIAGNOSIS — Z992 Dependence on renal dialysis: Secondary | ICD-10-CM | POA: Diagnosis not present

## 2019-05-07 DIAGNOSIS — N2581 Secondary hyperparathyroidism of renal origin: Secondary | ICD-10-CM | POA: Diagnosis not present

## 2019-05-07 DIAGNOSIS — D631 Anemia in chronic kidney disease: Secondary | ICD-10-CM | POA: Diagnosis not present

## 2019-05-08 DIAGNOSIS — U071 COVID-19: Secondary | ICD-10-CM

## 2019-05-08 HISTORY — DX: COVID-19: U07.1

## 2019-05-10 DIAGNOSIS — Z23 Encounter for immunization: Secondary | ICD-10-CM | POA: Diagnosis not present

## 2019-05-10 DIAGNOSIS — D631 Anemia in chronic kidney disease: Secondary | ICD-10-CM | POA: Diagnosis not present

## 2019-05-10 DIAGNOSIS — N2581 Secondary hyperparathyroidism of renal origin: Secondary | ICD-10-CM | POA: Diagnosis not present

## 2019-05-10 DIAGNOSIS — N186 End stage renal disease: Secondary | ICD-10-CM | POA: Diagnosis not present

## 2019-05-11 DIAGNOSIS — E1122 Type 2 diabetes mellitus with diabetic chronic kidney disease: Secondary | ICD-10-CM | POA: Diagnosis not present

## 2019-05-11 DIAGNOSIS — I5043 Acute on chronic combined systolic (congestive) and diastolic (congestive) heart failure: Secondary | ICD-10-CM | POA: Diagnosis not present

## 2019-05-11 DIAGNOSIS — M216X9 Other acquired deformities of unspecified foot: Secondary | ICD-10-CM | POA: Diagnosis not present

## 2019-05-11 DIAGNOSIS — J45901 Unspecified asthma with (acute) exacerbation: Secondary | ICD-10-CM | POA: Diagnosis not present

## 2019-05-11 DIAGNOSIS — E1151 Type 2 diabetes mellitus with diabetic peripheral angiopathy without gangrene: Secondary | ICD-10-CM | POA: Diagnosis not present

## 2019-05-11 DIAGNOSIS — N2581 Secondary hyperparathyroidism of renal origin: Secondary | ICD-10-CM | POA: Diagnosis not present

## 2019-05-11 DIAGNOSIS — N186 End stage renal disease: Secondary | ICD-10-CM | POA: Diagnosis not present

## 2019-05-11 DIAGNOSIS — D631 Anemia in chronic kidney disease: Secondary | ICD-10-CM | POA: Diagnosis not present

## 2019-05-11 DIAGNOSIS — I132 Hypertensive heart and chronic kidney disease with heart failure and with stage 5 chronic kidney disease, or end stage renal disease: Secondary | ICD-10-CM | POA: Diagnosis not present

## 2019-05-12 DIAGNOSIS — D631 Anemia in chronic kidney disease: Secondary | ICD-10-CM | POA: Diagnosis not present

## 2019-05-12 DIAGNOSIS — N186 End stage renal disease: Secondary | ICD-10-CM | POA: Diagnosis not present

## 2019-05-12 DIAGNOSIS — M109 Gout, unspecified: Secondary | ICD-10-CM | POA: Diagnosis not present

## 2019-05-12 DIAGNOSIS — N2581 Secondary hyperparathyroidism of renal origin: Secondary | ICD-10-CM | POA: Diagnosis not present

## 2019-05-12 DIAGNOSIS — Z23 Encounter for immunization: Secondary | ICD-10-CM | POA: Diagnosis not present

## 2019-05-12 DIAGNOSIS — E119 Type 2 diabetes mellitus without complications: Secondary | ICD-10-CM | POA: Diagnosis not present

## 2019-05-14 DIAGNOSIS — N2581 Secondary hyperparathyroidism of renal origin: Secondary | ICD-10-CM | POA: Diagnosis not present

## 2019-05-14 DIAGNOSIS — N186 End stage renal disease: Secondary | ICD-10-CM | POA: Diagnosis not present

## 2019-05-14 DIAGNOSIS — Z23 Encounter for immunization: Secondary | ICD-10-CM | POA: Diagnosis not present

## 2019-05-14 DIAGNOSIS — D631 Anemia in chronic kidney disease: Secondary | ICD-10-CM | POA: Diagnosis not present

## 2019-05-17 DIAGNOSIS — N186 End stage renal disease: Secondary | ICD-10-CM | POA: Diagnosis not present

## 2019-05-17 DIAGNOSIS — Z23 Encounter for immunization: Secondary | ICD-10-CM | POA: Diagnosis not present

## 2019-05-17 DIAGNOSIS — N2581 Secondary hyperparathyroidism of renal origin: Secondary | ICD-10-CM | POA: Diagnosis not present

## 2019-05-17 DIAGNOSIS — D631 Anemia in chronic kidney disease: Secondary | ICD-10-CM | POA: Diagnosis not present

## 2019-05-18 DIAGNOSIS — N2581 Secondary hyperparathyroidism of renal origin: Secondary | ICD-10-CM | POA: Diagnosis not present

## 2019-05-18 DIAGNOSIS — E1151 Type 2 diabetes mellitus with diabetic peripheral angiopathy without gangrene: Secondary | ICD-10-CM | POA: Diagnosis not present

## 2019-05-18 DIAGNOSIS — N186 End stage renal disease: Secondary | ICD-10-CM | POA: Diagnosis not present

## 2019-05-18 DIAGNOSIS — I132 Hypertensive heart and chronic kidney disease with heart failure and with stage 5 chronic kidney disease, or end stage renal disease: Secondary | ICD-10-CM | POA: Diagnosis not present

## 2019-05-18 DIAGNOSIS — E1122 Type 2 diabetes mellitus with diabetic chronic kidney disease: Secondary | ICD-10-CM | POA: Diagnosis not present

## 2019-05-18 DIAGNOSIS — J45901 Unspecified asthma with (acute) exacerbation: Secondary | ICD-10-CM | POA: Diagnosis not present

## 2019-05-18 DIAGNOSIS — M216X9 Other acquired deformities of unspecified foot: Secondary | ICD-10-CM | POA: Diagnosis not present

## 2019-05-18 DIAGNOSIS — I5043 Acute on chronic combined systolic (congestive) and diastolic (congestive) heart failure: Secondary | ICD-10-CM | POA: Diagnosis not present

## 2019-05-18 DIAGNOSIS — D631 Anemia in chronic kidney disease: Secondary | ICD-10-CM | POA: Diagnosis not present

## 2019-05-19 ENCOUNTER — Other Ambulatory Visit: Payer: Self-pay

## 2019-05-19 DIAGNOSIS — D631 Anemia in chronic kidney disease: Secondary | ICD-10-CM | POA: Diagnosis not present

## 2019-05-19 DIAGNOSIS — N2581 Secondary hyperparathyroidism of renal origin: Secondary | ICD-10-CM | POA: Diagnosis not present

## 2019-05-19 DIAGNOSIS — J45909 Unspecified asthma, uncomplicated: Secondary | ICD-10-CM | POA: Diagnosis not present

## 2019-05-19 DIAGNOSIS — I5043 Acute on chronic combined systolic (congestive) and diastolic (congestive) heart failure: Secondary | ICD-10-CM | POA: Diagnosis not present

## 2019-05-19 DIAGNOSIS — N186 End stage renal disease: Secondary | ICD-10-CM | POA: Diagnosis not present

## 2019-05-19 DIAGNOSIS — E8779 Other fluid overload: Secondary | ICD-10-CM | POA: Diagnosis not present

## 2019-05-19 NOTE — Patient Outreach (Signed)
[facility]) Care Management  05/19/2019  Monica [name] 1930-09-02 [id number]   Telephone Assessment    Outreach attempt #1 to patient. No answer. RN CM left HIPAA compliant voicemail message along with contact info.      Plan: RN CM will make outreach attempt to patient within 3-4 business days.  Enzo Montgomery, RN,BSN,CCM [place] Management Telephonic Care Management Coordinator Direct Phone: [phone] Toll Free: [phone] Fax: [phone]

## 2019-05-20 DIAGNOSIS — E1151 Type 2 diabetes mellitus with diabetic peripheral angiopathy without gangrene: Secondary | ICD-10-CM | POA: Diagnosis not present

## 2019-05-20 DIAGNOSIS — E782 Mixed hyperlipidemia: Secondary | ICD-10-CM | POA: Diagnosis not present

## 2019-05-20 DIAGNOSIS — N186 End stage renal disease: Secondary | ICD-10-CM | POA: Diagnosis not present

## 2019-05-20 DIAGNOSIS — I119 Hypertensive heart disease without heart failure: Secondary | ICD-10-CM | POA: Diagnosis not present

## 2019-05-20 DIAGNOSIS — M1A49X0 Other secondary chronic gout, multiple sites, without tophus (tophi): Secondary | ICD-10-CM | POA: Diagnosis not present

## 2019-05-20 DIAGNOSIS — Z992 Dependence on renal dialysis: Secondary | ICD-10-CM | POA: Diagnosis not present

## 2019-05-20 DIAGNOSIS — Z789 Other specified health status: Secondary | ICD-10-CM | POA: Diagnosis not present

## 2019-05-20 DIAGNOSIS — I2510 Atherosclerotic heart disease of native coronary artery without angina pectoris: Secondary | ICD-10-CM | POA: Diagnosis not present

## 2019-05-21 DIAGNOSIS — N2581 Secondary hyperparathyroidism of renal origin: Secondary | ICD-10-CM | POA: Diagnosis not present

## 2019-05-21 DIAGNOSIS — D631 Anemia in chronic kidney disease: Secondary | ICD-10-CM | POA: Diagnosis not present

## 2019-05-21 DIAGNOSIS — N186 End stage renal disease: Secondary | ICD-10-CM | POA: Diagnosis not present

## 2019-05-21 DIAGNOSIS — E8779 Other fluid overload: Secondary | ICD-10-CM | POA: Diagnosis not present

## 2019-05-23 ENCOUNTER — Other Ambulatory Visit: Payer: Self-pay

## 2019-05-23 NOTE — Patient Outreach (Signed)
[facility]) Care Management  05/23/2019  Monica [name] 11-Apr-1931 [id number]   Telephone Assessment   Outreach attempt #2 to patient. Spoke with patient. She denies any acute issues or concerns at present. She shares that during one of her dialysis treatments last week she got SOB and started having issues. She feels that it was because they "pulled too much fluid off" as patient reports it happened near the end of the treatment. She voices that she was given some medicine to take and sxs went away. She denies any further sxs or issues. She went for PCP appt on last Friday. She voices appt went well and no changes to her meds were made. She goes back to see PCP 06/17/2019. No issues with transportation. Patient has supportive family to assist her as needed. She states that appetite remains good. She is adhering to diet restrictions. Patient has scale and BP monitor in the home and reports checking them both daily. She has not had a chance to check yet this morning as she is just getting up. She denies any RN CM needs or concerns at this time.     THN CM Care Plan Problem One     Most Recent Value  Care Plan Problem One  Recent hospitalization related to Bradycardia  Role Documenting the Problem One  Care Management [place] for Problem One  Active  THN Long Term Goal   Patient will report no further bradycardia events over the next 31 days.  THN Long Term Goal Start Date  04/22/19  THN Long Term Goal Met Date  05/23/19  THN CM Short Term Goal #1   Patient will report med adherence  100% of the time over the next 30 days.  THN CM Short Term Goal #1 Start Date  04/22/19  THN CM Short Term Goal #1 Met Date  05/23/19    [facility] CM Care Plan Problem Two     Most Recent Value  Care Plan Problem Two  GU issues related to difficulty voiding and possible infection  Role Documenting the Problem Two  Care Management Telephonic Coordinator  Care Plan for Problem Two   Active  THN CM Short Term Goal #1   Pt will contact her provider with the reported symptoms of delay voiding and burning sensation upon urination within the next 2 weeks for interventions.   THN CM Short Term Goal #1 Start Date  03/25/19  THN CM Short Term Goal #1 Met Date   05/23/19    [facility] CM Care Plan Problem Three     Most Recent Value  Care Plan Problem Three  Knowledge deficit related to chronic conditions and mgmt of sxs  Role Documenting the Problem Three  Care Management Telephonic Coordinator  Care Plan for Problem Three  Active  THN Long Term Goal   Patient will have no hospitalizations within the next 90 days.  THN Long Term Goal Start Date  05/23/19  Interventions for Problem Three Long Term Goal  RNCM assessed for any acute issues/sxs. RN CM reviewed with pt. action plan and when to seek medical attention. RNCM confirmed pt. knows when,how and why to contact MDs.  [facility] CM Short Term Goal #1   Patient will monitor and record daily weights and BP in the home over the next 30 days.  THN CM Short Term Goal #1 Start Date  05/23/19  Interventions for Short Term Goal #1  RN CM discussed with pt. importance of monitoring BP  and wgt in the home. RN CM confirmed that pt. has devices in the home and knows how to use them.  THN CM Short Term Goal #2   Patient will report being able to tolerate HD txs without any issues/sxs over the next 30 days.  THN CM Short Term Goal #2 Start Date  05/23/19  Interventions for Short Term Goal #2  RN CM discussed with pt s/s of worsening condiiton. RNCM confirmed with pt. that she knows what sxs are abnormal and when to alert HD staff of sxs.      Plan: RN CM RN CM discussed with patient next outreach within a month. Patient gave verbal consent and in agreement with RN CM follow up timeframe. Patient aware that they may contact RN CM sooner for any issues or concerns   Enzo Montgomery, RN,BSN,CCM [place] Management  Coordinator Direct Phone: [phone] Toll Free: [phone] Fax: [phone]

## 2019-05-24 DIAGNOSIS — D631 Anemia in chronic kidney disease: Secondary | ICD-10-CM | POA: Diagnosis not present

## 2019-05-24 DIAGNOSIS — N2581 Secondary hyperparathyroidism of renal origin: Secondary | ICD-10-CM | POA: Diagnosis not present

## 2019-05-24 DIAGNOSIS — E8779 Other fluid overload: Secondary | ICD-10-CM | POA: Diagnosis not present

## 2019-05-24 DIAGNOSIS — N186 End stage renal disease: Secondary | ICD-10-CM | POA: Diagnosis not present

## 2019-05-25 DIAGNOSIS — N186 End stage renal disease: Secondary | ICD-10-CM | POA: Diagnosis not present

## 2019-05-25 DIAGNOSIS — N2581 Secondary hyperparathyroidism of renal origin: Secondary | ICD-10-CM | POA: Diagnosis not present

## 2019-05-25 DIAGNOSIS — D631 Anemia in chronic kidney disease: Secondary | ICD-10-CM | POA: Diagnosis not present

## 2019-05-25 DIAGNOSIS — E8779 Other fluid overload: Secondary | ICD-10-CM | POA: Diagnosis not present

## 2019-05-25 MED ORDER — CLOPIDOGREL BISULFATE 75 MG PO TABS: 75.0000 mg | ORAL_TABLET | Freq: Every day | ORAL | 1 refills | Status: DC

## 2019-05-26 DIAGNOSIS — N2581 Secondary hyperparathyroidism of renal origin: Secondary | ICD-10-CM | POA: Diagnosis not present

## 2019-05-26 DIAGNOSIS — D631 Anemia in chronic kidney disease: Secondary | ICD-10-CM | POA: Diagnosis not present

## 2019-05-26 DIAGNOSIS — N186 End stage renal disease: Secondary | ICD-10-CM | POA: Diagnosis not present

## 2019-05-26 DIAGNOSIS — E8779 Other fluid overload: Secondary | ICD-10-CM | POA: Diagnosis not present

## 2019-05-27 DIAGNOSIS — N2581 Secondary hyperparathyroidism of renal origin: Secondary | ICD-10-CM | POA: Diagnosis not present

## 2019-05-27 DIAGNOSIS — J45901 Unspecified asthma with (acute) exacerbation: Secondary | ICD-10-CM | POA: Diagnosis not present

## 2019-05-27 DIAGNOSIS — I132 Hypertensive heart and chronic kidney disease with heart failure and with stage 5 chronic kidney disease, or end stage renal disease: Secondary | ICD-10-CM | POA: Diagnosis not present

## 2019-05-27 DIAGNOSIS — M216X9 Other acquired deformities of unspecified foot: Secondary | ICD-10-CM | POA: Diagnosis not present

## 2019-05-27 DIAGNOSIS — N186 End stage renal disease: Secondary | ICD-10-CM | POA: Diagnosis not present

## 2019-05-27 DIAGNOSIS — E1122 Type 2 diabetes mellitus with diabetic chronic kidney disease: Secondary | ICD-10-CM | POA: Diagnosis not present

## 2019-05-27 DIAGNOSIS — E1151 Type 2 diabetes mellitus with diabetic peripheral angiopathy without gangrene: Secondary | ICD-10-CM | POA: Diagnosis not present

## 2019-05-27 DIAGNOSIS — I5043 Acute on chronic combined systolic (congestive) and diastolic (congestive) heart failure: Secondary | ICD-10-CM | POA: Diagnosis not present

## 2019-05-27 DIAGNOSIS — D631 Anemia in chronic kidney disease: Secondary | ICD-10-CM | POA: Diagnosis not present

## 2019-05-28 DIAGNOSIS — D631 Anemia in chronic kidney disease: Secondary | ICD-10-CM | POA: Diagnosis not present

## 2019-05-28 DIAGNOSIS — N186 End stage renal disease: Secondary | ICD-10-CM | POA: Diagnosis not present

## 2019-05-28 DIAGNOSIS — N2581 Secondary hyperparathyroidism of renal origin: Secondary | ICD-10-CM | POA: Diagnosis not present

## 2019-05-30 DIAGNOSIS — M216X9 Other acquired deformities of unspecified foot: Secondary | ICD-10-CM | POA: Diagnosis not present

## 2019-05-30 DIAGNOSIS — N2581 Secondary hyperparathyroidism of renal origin: Secondary | ICD-10-CM | POA: Diagnosis not present

## 2019-05-30 DIAGNOSIS — E1122 Type 2 diabetes mellitus with diabetic chronic kidney disease: Secondary | ICD-10-CM | POA: Diagnosis not present

## 2019-05-30 DIAGNOSIS — D631 Anemia in chronic kidney disease: Secondary | ICD-10-CM | POA: Diagnosis not present

## 2019-05-30 DIAGNOSIS — E1151 Type 2 diabetes mellitus with diabetic peripheral angiopathy without gangrene: Secondary | ICD-10-CM | POA: Diagnosis not present

## 2019-05-30 DIAGNOSIS — J45901 Unspecified asthma with (acute) exacerbation: Secondary | ICD-10-CM | POA: Diagnosis not present

## 2019-05-30 DIAGNOSIS — I132 Hypertensive heart and chronic kidney disease with heart failure and with stage 5 chronic kidney disease, or end stage renal disease: Secondary | ICD-10-CM | POA: Diagnosis not present

## 2019-05-30 DIAGNOSIS — I5043 Acute on chronic combined systolic (congestive) and diastolic (congestive) heart failure: Secondary | ICD-10-CM | POA: Diagnosis not present

## 2019-05-30 DIAGNOSIS — N186 End stage renal disease: Secondary | ICD-10-CM | POA: Diagnosis not present

## 2019-05-31 ENCOUNTER — Emergency Department (HOSPITAL_COMMUNITY)
Admission: EM | Admit: 2019-05-31 | Discharge: 2019-05-31 | Disposition: A | Discharge status: Home / Self Care | Payer: Medicare HMO | Attending: Emergency Medicine | Admitting: Emergency Medicine

## 2019-05-31 ENCOUNTER — Other Ambulatory Visit: Payer: Self-pay

## 2019-05-31 ENCOUNTER — Telehealth: Payer: Self-pay | Admitting: Cardiology

## 2019-05-31 ENCOUNTER — Emergency Department (HOSPITAL_COMMUNITY): Payer: Medicare HMO

## 2019-05-31 ENCOUNTER — Encounter (HOSPITAL_COMMUNITY): Payer: Self-pay

## 2019-05-31 ENCOUNTER — Ambulatory Visit (HOSPITAL_COMMUNITY): Admit: 2019-05-31 | Payer: Medicare HMO | Admitting: Interventional Cardiology

## 2019-05-31 ENCOUNTER — Encounter (HOSPITAL_COMMUNITY): Payer: Self-pay | Admitting: Emergency Medicine

## 2019-05-31 DIAGNOSIS — U071 COVID-19: Secondary | ICD-10-CM | POA: Insufficient documentation

## 2019-05-31 DIAGNOSIS — Z7902 Long term (current) use of antithrombotics/antiplatelets: Secondary | ICD-10-CM | POA: Insufficient documentation

## 2019-05-31 DIAGNOSIS — I132 Hypertensive heart and chronic kidney disease with heart failure and with stage 5 chronic kidney disease, or end stage renal disease: Secondary | ICD-10-CM | POA: Diagnosis not present

## 2019-05-31 DIAGNOSIS — R079 Chest pain, unspecified: Secondary | ICD-10-CM | POA: Diagnosis not present

## 2019-05-31 DIAGNOSIS — J45909 Unspecified asthma, uncomplicated: Secondary | ICD-10-CM | POA: Diagnosis not present

## 2019-05-31 DIAGNOSIS — Z955 Presence of coronary angioplasty implant and graft: Secondary | ICD-10-CM | POA: Diagnosis not present

## 2019-05-31 DIAGNOSIS — I10 Essential (primary) hypertension: Secondary | ICD-10-CM | POA: Diagnosis not present

## 2019-05-31 DIAGNOSIS — I120 Hypertensive chronic kidney disease with stage 5 chronic kidney disease or end stage renal disease: Secondary | ICD-10-CM | POA: Diagnosis not present

## 2019-05-31 DIAGNOSIS — D631 Anemia in chronic kidney disease: Secondary | ICD-10-CM | POA: Diagnosis not present

## 2019-05-31 DIAGNOSIS — Z87891 Personal history of nicotine dependence: Secondary | ICD-10-CM | POA: Insufficient documentation

## 2019-05-31 DIAGNOSIS — I208 Other forms of angina pectoris: Secondary | ICD-10-CM | POA: Diagnosis not present

## 2019-05-31 DIAGNOSIS — Z951 Presence of aortocoronary bypass graft: Secondary | ICD-10-CM | POA: Insufficient documentation

## 2019-05-31 DIAGNOSIS — Z992 Dependence on renal dialysis: Secondary | ICD-10-CM | POA: Insufficient documentation

## 2019-05-31 DIAGNOSIS — Z7982 Long term (current) use of aspirin: Secondary | ICD-10-CM | POA: Diagnosis not present

## 2019-05-31 DIAGNOSIS — E1122 Type 2 diabetes mellitus with diabetic chronic kidney disease: Secondary | ICD-10-CM | POA: Insufficient documentation

## 2019-05-31 DIAGNOSIS — N186 End stage renal disease: Secondary | ICD-10-CM | POA: Diagnosis not present

## 2019-05-31 DIAGNOSIS — I5042 Chronic combined systolic (congestive) and diastolic (congestive) heart failure: Secondary | ICD-10-CM | POA: Insufficient documentation

## 2019-05-31 DIAGNOSIS — I200 Unstable angina: Secondary | ICD-10-CM | POA: Diagnosis not present

## 2019-05-31 DIAGNOSIS — I209 Angina pectoris, unspecified: Secondary | ICD-10-CM | POA: Diagnosis not present

## 2019-05-31 DIAGNOSIS — R000 Tachycardia, unspecified: Secondary | ICD-10-CM | POA: Diagnosis not present

## 2019-05-31 DIAGNOSIS — Z79899 Other long term (current) drug therapy: Secondary | ICD-10-CM | POA: Insufficient documentation

## 2019-05-31 DIAGNOSIS — N2581 Secondary hyperparathyroidism of renal origin: Secondary | ICD-10-CM | POA: Diagnosis not present

## 2019-05-31 DIAGNOSIS — R0789 Other chest pain: Secondary | ICD-10-CM | POA: Diagnosis not present

## 2019-05-31 LAB — URINALYSIS, ROUTINE W REFLEX MICROSCOPIC
Bilirubin Urine: NEGATIVE
Glucose, UA: NEGATIVE mg/dL
Ketones, ur: NEGATIVE mg/dL
Nitrite: NEGATIVE
Protein, ur: 100 mg/dL — AB
Specific Gravity, Urine: 1.009 (ref 1.005–1.030)
WBC, UA: >50 WBC/hpf — ABNORMAL HIGH (ref 0–5)
pH: 7 (ref 5.0–8.0)

## 2019-05-31 LAB — CBC
HCT: 40.3 % (ref 36.0–46.0)
Hemoglobin: 12.5 g/dL (ref 12.0–15.0)
MCH: 30.4 pg (ref 26.0–34.0)
MCHC: 31 g/dL (ref 30.0–36.0)
MCV: 98.1 fL (ref 80.0–100.0)
Platelets: 187 10*3/uL (ref 150–400)
RBC: 4.11 MIL/uL (ref 3.87–5.11)
RDW: 17.5 % — ABNORMAL HIGH (ref 11.5–15.5)
WBC: 4.3 10*3/uL (ref 4.0–10.5)
nRBC: 0 % (ref 0.0–0.2)

## 2019-05-31 LAB — BRAIN NATRIURETIC PEPTIDE: B Natriuretic Peptide: 3301.8 pg/mL — ABNORMAL HIGH (ref 0.0–100.0)

## 2019-05-31 LAB — BASIC METABOLIC PANEL
Anion gap: 11 (ref 5–15)
BUN: 11 mg/dL (ref 8–23)
CO2: 26 mmol/L (ref 22–32)
Calcium: 8.8 mg/dL — ABNORMAL LOW (ref 8.9–10.3)
Chloride: 100 mmol/L (ref 98–111)
Creatinine, Ser: 2.91 mg/dL — ABNORMAL HIGH (ref 0.44–1.00)
GFR calc Af Amer: 16 mL/min — ABNORMAL LOW (ref >60)
GFR calc non Af Amer: 14 mL/min — ABNORMAL LOW (ref >60)
Glucose, Bld: 189 mg/dL — ABNORMAL HIGH (ref 70–99)
Potassium: 4.7 mmol/L (ref 3.5–5.1)
Sodium: 137 mmol/L (ref 135–145)

## 2019-05-31 LAB — TROPONIN I (HIGH SENSITIVITY)
Troponin I (High Sensitivity): 79 ng/L — ABNORMAL HIGH (ref <18)
Troponin I (High Sensitivity): 80 ng/L — ABNORMAL HIGH (ref <18)

## 2019-05-31 LAB — SARS CORONAVIRUS 2 (TAT 6-24 HRS): SARS Coronavirus 2: POSITIVE — AB

## 2019-05-31 SURGERY — LEFT HEART CATH AND CORONARY ANGIOGRAPHY
Anesthesia: LOCAL

## 2019-05-31 NOTE — ED Notes (Signed)
Pt walked to the bathroom assisted with cane on steady gait

## 2019-05-31 NOTE — ED Provider Notes (Signed)
[facility] EMERGENCY DEPARTMENT Provider Note   CSN: [id number] Arrival date & time: 05/31/19  1002     History   Chief Complaint Chief Complaint  Patient presents with  . Code STEMI    HPI Monica [name] is a 85 y.o. female.     Pt presents to the ED today with cp.  Pt was at dialysis and developed cp.  She was given a total of 5 nitroglycerin tabs and now has no cp.  She did complete her dialysis today.  Pt denies sob.  She feels ok now.  No f/c.  A code stemi was called, but was cancelled after arrival.     Past Medical History:  Diagnosis Date  . Anemia   . Asthma   . Chronic combined systolic and diastolic CHF (congestive heart failure) ([place])    a. Echo 11/19 [facility]):  EF 55-65, normal wall motion, normal diastolic function. b. Echo 01/2019 EF 30-35%, moderate MR, mild AI.  Marland Kitchen Coronary artery disease    a. s/p CABG 2017. b.s/p NSTEMI 11/19 [facility]) >> S-PDA 100 >>PCI: 2.25 x 24 mm Synergy DES to S-OM. c. NSTEMI 01/2019 - occluded SVG-distal RCA, severe proximal to stent/ISR of SVG-OM s/p overlapping DES, DES to ostial prox SVG to the RI.   Marland Kitchen ESRD (end stage renal disease)    Dialysis Tu, Th, Sa  . Essential hypertension   . Gastroesophageal reflux disease   . History of blood transfusion   . Hyperlipidemia   . Junctional bradycardia   . Moderate mitral regurgitation   . Peptic ulcer   . Protein calorie malnutrition ([place])   . Sinus node dysfunction (HCC)   . Type 2 diabetes mellitus [facility])     Patient Active Problem List   Diagnosis Date Noted  . Acute on chronic combined systolic and diastolic CHF (congestive heart failure) ([place]) 03/18/2019  . Anemia of chronic disease 03/17/2019  . ESRD on hemodialysis ([place]) 03/17/2019  . Sinus node dysfunction ([place]) 01/25/2019  . Ischemic cardiomyopathy 01/25/2019  . Hyperkalemia 01/18/2019  . Acute lower UTI 01/18/2019  . Elevated troponin   . Junctional bradycardia   . Bradycardia  01/17/2019  . S/P coronary artery stent placement   . Acute combined systolic and diastolic heart failure ([place])   . Non-ST elevation (NSTEMI) myocardial infarction ([place]) 01/08/2019  . Acute respiratory failure ([place]) 01/07/2019  . Diabetes mellitus with peripheral circulatory disorder ([place]) 02/05/2018  . Altered mental status 03/13/2017  . Claudication in peripheral vascular disease ([place]) 12/29/2016  . Abnormal weight loss 06/05/2016  . Bilateral pleural effusion 11/23/2015  . Chronic combined systolic and diastolic CHF (congestive heart failure) ([place]) 11/23/2015  . Dyspnea 11/23/2015  . Pleural effusion 11/23/2015  . CAD (coronary artery disease) 10/05/2015  . S/P CABG x 4 09/19/2015  . History of coronary artery bypass surgery 09/19/2015  . Prolonged Q-T interval on ECG 09/17/2015  . Angina pectoris ([place])   . Gastroesophageal reflux disease 09/15/2015  . Gout 09/15/2015  . Chest pain 09/15/2015  . Hyperlipidemia   . Diabetes mellitus with renal complications ([place])   . Obesity (BMI 30.0-34.9) 08/02/2015  . Asthma 04/27/2015  . Vitamin D deficiency 04/27/2015  . Hyperlipidemia associated with type 2 diabetes mellitus ([place]) 04/27/2015  . Essential hypertension 04/24/2015  . ESRD (end stage renal disease) ([place]) 04/24/2015  . Type 2 diabetes mellitus with hyperglycemia ([place]) 04/24/2015  . Metatarsalgia of both feet 07/25/2014  . Equinus deformity of foot,  acquired 07/25/2014  . Onychomycosis 07/25/2014  . Pain in lower limb 07/25/2014  . Acquired equinus deformity of foot 07/25/2014  . Metatarsalgia 07/25/2014  . Pain of lower extremity 07/25/2014    Past Surgical History:  Procedure Laterality Date  . ABDOMINAL HYSTERECTOMY    . CARDIAC CATHETERIZATION N/A 09/17/2015   Procedure: Left Heart Cath and Coronary Angiography;  Surgeon: Jettie Booze, MD;  Location: [facility];  Service: Cardiovascular;  Laterality: N/A;  . COLON SURGERY    . CORONARY ARTERY BYPASS  GRAFT N/A 09/19/2015   Procedure: CORONARY ARTERY BYPASS GRAFTING (CABG) TIMES FOUR USING BILATARAL SAPHENOUS VEIN GRAFTS AND LEFT INTERNAL MAMMARY ARTERY;  Surgeon: [name], MD;  Location: [facility];  Service: Open Heart Surgery;  Laterality: N/A;  . CORONARY STENT INTERVENTION N/A 01/10/2019   Procedure: CORONARY STENT INTERVENTION;  Surgeon: Leonie Man, MD;  Location: [place] [facility];  Service: Cardiovascular;  Laterality: N/A;  . LEFT HEART CATH AND CORS/GRAFTS ANGIOGRAPHY N/A 01/10/2019   Procedure: LEFT HEART CATH AND CORS/GRAFTS ANGIOGRAPHY;  Surgeon: Leonie Man, MD;  Location: [place] [facility];  Service: Cardiovascular;  Laterality: N/A;  . LEFT HEART CATH AND CORS/GRAFTS ANGIOGRAPHY N/A 02/11/2019   Procedure: LEFT HEART CATH AND CORS/GRAFTS ANGIOGRAPHY;  Surgeon: Burnell Blanks, MD;  Location: [place] [facility];  Service: Cardiovascular;  Laterality: N/A;  . TEE WITHOUT CARDIOVERSION N/A 09/19/2015   Procedure: TRANSESOPHAGEAL ECHOCARDIOGRAM (TEE);  Surgeon: [name], MD;  Location: [facility];  Service: Open Heart Surgery;  Laterality: N/A;     OB History   No obstetric history on file.      Home Medications    Prior to Admission medications   Medication Sig Start Date End Date Taking? Authorizing Provider  acetaminophen (TYLENOL) 500 MG tablet Take 1 tablet (500 mg total) by mouth every 6 (six) hours as needed for mild pain or headache. 02/12/19   Eugenie Filler, MD  albuterol (PROVENTIL HFA;VENTOLIN HFA) 108 (90 Base) MCG/ACT inhaler Inhale 2 puffs into the lungs every 6 (six) hours as needed for wheezing or shortness of breath.    [provider]  allopurinol (ZYLOPRIM) 100 MG tablet Take 100 mg by mouth 2 (two) times daily.     [provider]  aspirin EC 81 MG EC tablet Take 1 tablet (81 mg total) by mouth daily. 10/03/15   Barrett, Erin R, PA-C  atorvastatin (LIPITOR) 80 MG tablet Take 80 mg by mouth daily at 6 PM.  07/12/18    [provider]  brimonidine (ALPHAGAN P) 0.1 % SOLN Place 1 drop into both eyes 2 (two) times daily.    [provider]  carvedilol (COREG) 12.5 MG tablet Take 1 tablet (12.5 mg total) by mouth 2 (two) times daily. 03/04/19   Dorothy Spark, MD  Cholecalciferol (VITAMIN D-3) 1000 units CAPS Take 1,000 Units by mouth daily.     [provider]  clopidogrel (PLAVIX) 75 MG tablet Take 1 tablet (75 mg total) by mouth daily. 05/25/19   Dorothy Spark, MD  diphenhydrAMINE (BENADRYL) 25 MG tablet Take 25 mg by mouth as needed for itching or allergies.     [provider]  ferric citrate (AURYXIA) 1 GM 210 MG(Fe) tablet Take 210 mg by mouth 3 (three) times daily with meals.     [provider]  hydrALAZINE (APRESOLINE) 25 MG tablet Take 1 tablet (25 mg total) by mouth 2 (two) times  daily. 04/07/19   Isaiah Serge, NP  isosorbide mononitrate (IMDUR) 60 MG 24 hr tablet Take 120 mg on Monday, Wednesday, Friday and Sunday.   Take 60 mg on (Tuesday, Thursday and Saturday)  Prior  to Dialysis and 60 mg After dialysis/ 03/19/19   Regalado, Belkys A, MD  latanoprost (XALATAN) 0.005 % ophthalmic solution Place 1 drop into both eyes at bedtime. 07/05/18   [provider]  loratadine (CLARITIN) 10 MG tablet Take 10 mg by mouth as needed for allergies.     [provider]  Melatonin 5 MG CAPS Take 5 mg by mouth at bedtime.     [provider]  multivitamin (RENA-VIT) TABS tablet Take 1 tablet by mouth daily.    [provider]  nitroGLYCERIN (NITROSTAT) 0.4 MG SL tablet Place 1 tablet (0.4 mg total) under the tongue every 5 (five) minutes as needed for chest pain. 03/18/19   Barrett, Evelene Croon, PA-C  pantoprazole (PROTONIX) 20 MG tablet Take 20 mg by mouth daily.    [provider]    Family History Family History  Problem Relation Age of Onset  . Heart attack Father     Social History Social History   Tobacco Use   . Smoking status: Never Smoker  . Smokeless tobacco: Former Systems developer    Types: Snuff  Substance Use Topics  . Alcohol use: No    Alcohol/week: 0.0 standard drinks  . Drug use: No     Allergies   Patient has no known allergies.   Review of Systems Review of Systems  Cardiovascular: Positive for chest pain.  All other systems reviewed and are negative.    Physical Exam Updated Vital Signs BP (!) 165/71   Pulse 82   Temp 98.2 F (36.8 C) (Oral)   Resp 18   Ht 5\' 2"  (1.575 m)   Wt 73.5 kg   SpO2 100%   BMI 29.63 kg/m   Physical Exam Vitals signs and nursing note reviewed.  Constitutional:      Appearance: Normal appearance.  HENT:     Head: Normocephalic and atraumatic.     Right Ear: External ear normal.     Left Ear: External ear normal.     Nose: Nose normal.     Mouth/Throat:     Mouth: Mucous membranes are moist.     Pharynx: Oropharynx is clear.  Eyes:     Extraocular Movements: Extraocular movements intact.     Conjunctiva/sclera: Conjunctivae normal.     Pupils: Pupils are equal, round, and reactive to light.  Neck:     Musculoskeletal: Normal range of motion and neck supple.  Cardiovascular:     Rate and Rhythm: Normal rate and regular rhythm.     Pulses: Normal pulses.     Heart sounds: Normal heart sounds.  Pulmonary:     Effort: Pulmonary effort is normal.     Breath sounds: Normal breath sounds.  Abdominal:     General: Abdomen is flat. Bowel sounds are normal.  Musculoskeletal:     Comments: LUE AVF with good thrill  Skin:    General: Skin is warm.     Capillary Refill: Capillary refill takes less than 2 seconds.  Neurological:     General: No focal deficit present.     Mental Status: She is alert and oriented to person, place, and time.  Psychiatric:        Mood and Affect: Mood normal.  Behavior: Behavior normal.        Thought Content: Thought content normal.        Judgment: Judgment normal.      ED Treatments / Results   Labs (all labs ordered are listed, but only abnormal results are displayed) Labs Reviewed  BASIC METABOLIC PANEL - Abnormal; Notable for the following components:      Result Value   Glucose, Bld 189 (*)    Creatinine, Ser 2.91 (*)    Calcium 8.8 (*)    GFR calc non Af Amer 14 (*)    GFR calc Af Amer 16 (*)    All other components within normal limits  CBC - Abnormal; Notable for the following components:   RDW 17.5 (*)    All other components within normal limits  TROPONIN I (HIGH SENSITIVITY) - Abnormal; Notable for the following components:   Troponin I (High Sensitivity) 79 (*)    All other components within normal limits  SARS CORONAVIRUS 2 (TAT 6-24 HRS)  BRAIN NATRIURETIC PEPTIDE  TROPONIN I (HIGH SENSITIVITY)    EKG EKG Interpretation  Date/Time:  Tuesday May 31 2019 10:07:12 EST Ventricular Rate:  94 PR Interval:    QRS Duration: 108 QT Interval:  411 QTC Calculation: 514 R Axis:   29 Text Interpretation: Sinus or ectopic atrial rhythm Probable anteroseptal infarct, recent Prolonged QT interval No significant change since last tracing Confirmed by Isla Pence [id number]) on 05/31/2019 10:09:00 AM   Radiology Dg Chest Port 1 View  Result Date: 05/31/2019 CLINICAL DATA:  Chest pain EXAM: PORTABLE CHEST 1 VIEW COMPARISON:  March 17 2019 FINDINGS: Pulmonary vascular congestion. Possible trace right pleural effusion. No pneumothorax. Cardiomegaly. IMPRESSION: Pulmonary vascular congestion. Possible trace right pleural effusion. Cardiomegaly. Electronically Signed   By: Macy Mis M.D.   On: 05/31/2019 10:24    Procedures Procedures (including critical care time)  Medications Ordered in ED Medications - No data to display   Initial Impression / Assessment and Plan / ED Course  I have reviewed the triage vital signs and the nursing notes.  Pertinent labs & imaging results that were available during my care of the patient were reviewed by me and  considered in my medical decision making (see chart for details).    Code stemi cancelled as her EKG was unchanged and CP was gone.  I asked cardiology to see pt and give input.  They recommend overnight admission and serial troponins.  They request the hospitalists admit.  Pt d/w Dr. Evangeline Gula (triad) for admission.    Final Clinical Impressions(s) / ED Diagnoses   Final diagnoses:  Unstable angina ([place])  ESRD on hemodialysis [facility])    ED Discharge Orders    None       Isla Pence, MD 05/31/19 1542

## 2019-05-31 NOTE — Consult Note (Signed)
Cardiology Consultation:   Patient ID: Monica [name] MRN: [id number]; DOB: Dec 04, 1930  Admit date: 05/31/2019 Date of Consult: 05/31/2019  Primary Care Provider: Benito Mccreedy, MD Primary Cardiologist: Monica Dawley, MD  Primary Electrophysiologist:  None   Chief complaint  Patient Profile:   Monica [name] is a 85 y.o. female with a hx of CAD (s/p CABG 2017, NSTEMI 01/2019 with PCI), chronic combined CHF, moderate MR, anemia, bradycardia/sinus node dysfunction. ESRd on HD TThS, GERD, HLD, and DM2 who is being seen today for the evaluation of chest pain at the request of Dr. Gilford [name].  History of Present Illness:   Monica [name] follows with Monica [name] for the above cardiac issues. In early July the patient was admitted with NSTEMI. Cath showed occluded SVG to distal RCA, severe stent and in-stent restenosis of S/vG to OM with successful PTCA with overlapping DES stent and successful DES to the ostial proximal SVG to the RI. Echo showed new reduced EF 30-35% with moderately biatrial enlargement, modertae MR, and mild AI. She was started on ASA, plavix, and metoprolol. She was readmitted in mid July with fatigue and dizziness with junctional bradycardia HR in the 40s and metoprolol was discontinued. She was readmitted in [place] for chest pain and elevated troponin , peak 264. Nuclear stress test was abnormal and repeat cath showed stable disease with no targets for PCI. Imdur was titrated. She was started on low dose carvedilol. She is not on Ace/ARB or spiro due to renal function. An event monitor showed sinus bradycardia to sinus rhythm, 1st degree AV block, a few short runs of NSVT, longest 6 beats. Since discharged the patient had an event of chest pain during HD which was relieved by NTG and patient denied transportation to the hospital. She was hospitalized again 03/17/19 for volume overload. She was having hypotension on HD days and hydralazine was decreased to 25 mg TID. Her last visit was  05/02/19 and was doing much better, no chest pain  The patient presented to the ER 05/31/19 for chest pain that started at HD. HD sessions are normal TThS but she patient needed an extra one this week due to extra fluid and the Holidays. The chest pain started with about of an hour of dialysis left. It was a chest pressure that was substernal and non-radiating. No associated SOB, N/V, diaphoresis. The pain slowly increased until it was 10/10. The patient finished HD. She took 2 NTG with minimal relief. EMS was called and she was given 3 SL NTG and the pain resolved.   Code STEMI was called on the way to the ER, but canceled after arrival being that the patient was chest pain free and the EKG was not consistent with STEMI. In the ED BP 180/100, pulse 94, afebrile, RR 14, 99% O2. Labs revealed potassium 4.7, glucose 189. LFTs wnl. WBC 4.3, Hgb 12.5. Hs troponin 79. EKG showed NSR, 94 bpm, J point depression, no ST elevation. CXR showed pulmonary vascular congestion, possible trace right pleural effusion.Cardiology was consulted for possible admission.   Heart Pathway Score:     Past Medical History:  Diagnosis Date   Anemia    Asthma    Chronic combined systolic and diastolic CHF (congestive heart failure) ([place])    a. Echo 11/19 [facility]):  EF 55-65, normal wall motion, normal diastolic function. b. Echo 01/2019 EF 30-35%, moderate MR, mild AI.   Coronary artery disease    a. s/p CABG 2017. b.s/p NSTEMI 11/19 [facility]) >> S-PDA  100 >>PCI: 2.25 x 24 mm Synergy DES to S-OM. c. NSTEMI 01/2019 - occluded SVG-distal RCA, severe proximal to stent/ISR of SVG-OM s/p overlapping DES, DES to ostial prox SVG to the RI.    ESRD (end stage renal disease)    Dialysis Tu, Th, Sa   Essential hypertension    Gastroesophageal reflux disease    History of blood transfusion    Hyperlipidemia    Junctional bradycardia    Moderate mitral regurgitation    Peptic ulcer    Protein calorie  malnutrition (HCC)    Sinus node dysfunction (HCC)    Type 2 diabetes mellitus ([place])     Past Surgical History:  Procedure Laterality Date   ABDOMINAL HYSTERECTOMY     CARDIAC CATHETERIZATION N/A 09/17/2015   Procedure: Left Heart Cath and Coronary Angiography;  Surgeon: Monica Booze, MD;  Location: [place] [facility];  Service: Cardiovascular;  Laterality: N/A;   COLON SURGERY     CORONARY ARTERY BYPASS GRAFT N/A 09/19/2015   Procedure: CORONARY ARTERY BYPASS GRAFTING (CABG) TIMES FOUR USING BILATARAL SAPHENOUS VEIN GRAFTS AND LEFT INTERNAL MAMMARY ARTERY;  Surgeon: Monica Isaac, MD;  Location: [place];  Service: Open Heart Surgery;  Laterality: N/A;   CORONARY STENT INTERVENTION N/A 01/10/2019   Procedure: CORONARY STENT INTERVENTION;  Surgeon: Monica Man, MD;  Location: [place] [facility];  Service: Cardiovascular;  Laterality: N/A;   LEFT HEART CATH AND CORS/GRAFTS ANGIOGRAPHY N/A 01/10/2019   Procedure: LEFT HEART CATH AND CORS/GRAFTS ANGIOGRAPHY;  Surgeon: Monica Man, MD;  Location: [place] [facility];  Service: Cardiovascular;  Laterality: N/A;   LEFT HEART CATH AND CORS/GRAFTS ANGIOGRAPHY N/A 02/11/2019   Procedure: LEFT HEART CATH AND CORS/GRAFTS ANGIOGRAPHY;  Surgeon: Monica Blanks, MD;  Location: [place] [facility];  Service: Cardiovascular;  Laterality: N/A;   TEE WITHOUT CARDIOVERSION N/A 09/19/2015   Procedure: TRANSESOPHAGEAL ECHOCARDIOGRAM (TEE);  Surgeon: Monica Isaac, MD;  Location: [place];  Service: Open Heart Surgery;  Laterality: N/A;     Home Medications:  Prior to Admission medications   Medication Sig Start Date End Date Taking? Authorizing Provider  acetaminophen (TYLENOL) 500 MG tablet Take 1 tablet (500 mg total) by mouth every 6 (six) hours as needed for mild pain or headache. 02/12/19   Monica Filler, MD  albuterol (PROVENTIL HFA;VENTOLIN HFA) 108 (90 Base) MCG/ACT inhaler Inhale 2 puffs into the lungs every 6 (six) hours  as needed for wheezing or shortness of breath.    [provider]  allopurinol (ZYLOPRIM) 100 MG tablet Take 100 mg by mouth 2 (two) times daily.     [provider]  aspirin EC 81 MG EC tablet Take 1 tablet (81 mg total) by mouth daily. 10/03/15   Barrett, Erin R, PA-C  atorvastatin (LIPITOR) 80 MG tablet Take 80 mg by mouth daily at 6 PM.  07/12/18   [provider]  brimonidine (ALPHAGAN P) 0.1 % SOLN Place 1 drop into both eyes 2 (two) times daily.    [provider]  carvedilol (COREG) 12.5 MG tablet Take 1 tablet (12.5 mg total) by mouth 2 (two) times daily. 03/04/19   Dorothy Spark, MD  Cholecalciferol (VITAMIN D-3) 1000 units CAPS Take 1,000 Units by mouth daily.     [provider]  clopidogrel (PLAVIX) 75 MG tablet Take 1 tablet (75 mg total) by mouth daily. 05/25/19   Dorothy Spark, MD  diphenhydrAMINE (BENADRYL) 25 MG tablet Take  25 mg by mouth as needed for itching or allergies.     [provider]  ferric citrate (AURYXIA) 1 GM 210 MG(Fe) tablet Take 210 mg by mouth 3 (three) times daily with meals.     [provider]  hydrALAZINE (APRESOLINE) 25 MG tablet Take 1 tablet (25 mg total) by mouth 2 (two) times daily. 04/07/19   Isaiah Serge, NP  isosorbide mononitrate (IMDUR) 60 MG 24 hr tablet Take 120 mg on Monday, Wednesday, Friday and Sunday.   Take 60 mg on (Tuesday, Thursday and Saturday)  Prior  to Dialysis and 60 mg After dialysis/ 03/19/19   Regalado, Belkys A, MD  latanoprost (XALATAN) 0.005 % ophthalmic solution Place 1 drop into both eyes at bedtime. 07/05/18   [provider]  loratadine (CLARITIN) 10 MG tablet Take 10 mg by mouth as needed for allergies.     [provider]  Melatonin 5 MG CAPS Take 5 mg by mouth at bedtime.     [provider]  multivitamin (RENA-VIT) TABS tablet Take 1 tablet by mouth daily.    [provider]  nitroGLYCERIN (NITROSTAT) 0.4 MG SL  tablet Place 1 tablet (0.4 mg total) under the tongue every 5 (five) minutes as needed for chest pain. 03/18/19   Barrett, Evelene Croon, PA-C  pantoprazole (PROTONIX) 20 MG tablet Take 20 mg by mouth daily.    [provider]    Inpatient Medications: Scheduled Meds:  Continuous Infusions:  PRN Meds:   Allergies:   No Known Allergies  Social History:   Social History   Socioeconomic History   Marital status: Widowed    Spouse name: Not on file   Number of children: Not on file   Years of education: Not on file   Highest education level: Not on file  Occupational History   Occupation: Retired  Scientist, product/process development strain: Not hard at [employer]    Worry: Never true    Inability: Never true   Transportation needs    Medical: No    Non-medical: No  Tobacco Use   Smoking status: Never Smoker   Smokeless tobacco: Former Systems developer    Types: Snuff  Substance and Sexual Activity   Alcohol use: No    Alcohol/week: 0.0 standard drinks   Drug use: No   Sexual activity: Not on file  Lifestyle   Physical activity    Days per week: Not on file    Minutes per session: Not on file   Stress: Not on file  Relationships   Social connections    Talks on phone: More than three times a week    Gets together: More than three times a week    Attends religious service: More than 4 times per year    Active member of club or organization: No    Attends meetings of clubs or organizations: Never    Relationship status: Widowed   Intimate partner violence    Fear of current or ex partner: Not on file    Emotionally abused: Not on file    Physically abused: Not on file    Forced sexual activity: Not on file  Other Topics Concern   Not on file  Social History Narrative   Not on file    Family History:    Family History  Problem Relation Age of Onset   Heart attack Father      ROS:  Please see the history  of present illness.  All  other ROS reviewed and negative.     Physical Exam/Data:   Vitals:   05/31/19 1010 05/31/19 1145 05/31/19 1200 05/31/19 1215  BP: (!) 176/82 (!) 181/85 (!) 183/89 (!) 180/85  Pulse:      Resp: 18 16 19  (!) 24  Temp:      TempSrc:      SpO2:      Weight: 73.5 kg     Height: 5\' 2"  (1.575 m)      No intake or output data in the 24 hours ending 05/31/19 1313 Last 3 Weights 05/31/2019 05/02/2019 04/04/2019  Weight (lbs) 162 lb 163 lb 159 lb 6.4 oz  Weight (kg) 73.483 kg 73.936 kg 72.303 kg     Body mass index is 29.63 kg/m.  General:  Well nourished, well developed, in no acute distress HEENT: normal Lymph: no adenopathy Neck: no JVD Endocrine:  No thryomegaly Vascular: No carotid bruits; FA pulses 2+ bilaterally without bruits  Cardiac:  normal S1, S2; RRR; no murmur  Lungs:  Diffuse diminished sounds; clear to auscultation bilaterally, no wheezing, rhonchi or rales  Abd: soft, nontender, no hepatomegaly  Ext: Trace edema Musculoskeletal:  No deformities, BUE and BLE strength normal and equal Skin: warm and dry  Neuro:  CNs 2-12 intact, no focal abnormalities noted Psych:  Normal affect   EKG:  The EKG was personally reviewed and demonstrates:  NSR, 94 bpm, J point depression, no ST elevation, poor R wave progression Telemetry:  Telemetry was personally reviewed and demonstrates:  NSR, rates 80-90s, rare pause  Relevant CV Studies:  LHC 02/11/19  Dist LM to Prox LAD lesion is 30% stenosed.  Mid LAD-1 lesion is 95% stenosed.  Mid LAD-2 lesion is 100% stenosed.  Ost Cx to Mid Cx lesion is 100% stenosed.  Prox RCA lesion is 70% stenosed.  Prox RCA to Mid RCA lesion is 90% stenosed.  Mid RCA lesion is 95% stenosed.  Dist RCA lesion is 100% stenosed with 100% stenosed side branch in RPAV.  Ramus lesion is 100% stenosed.  LIMA and is normal in caliber.  There is no competitive flow  SVG.  The graft exhibits severe diffuse disease.  Origin to Prox Graft lesion  is 90% stenosed.  Prox Graft to Mid Graft lesion is 80% stenosed.  Dist Graft lesion is 100% stenosed.  SVG.  SVG.  Previously placed Origin to Prox Graft stent (unknown type) is widely patent.  Previously placed Dist Graft stent (unknown type) is widely patent.   1. Chronic occlusion of the LAD is known. The LIMA to the LAD is patent 2. Chronic occlusion of the Circumflex is known. The SVG to the ramus intermediate is patent with patent proximal stent.  The SVG to the OM is patent with patent distal body stent.  3. Chronic occlusion mid RCA. The SVG to the RCA is known to be occluded (not selectively engaged today).   Recommendations: No focal targets for PCI. Continue medical management. Consider addition of Ranexa.    Intervention    Laboratory Data:  High Sensitivity Troponin:   Recent Labs  Lab 05/31/19 1129  TROPONINIHS 79*     Chemistry Recent Labs  Lab 05/31/19 1129  NA 137  K 4.7  CL 100  CO2 26  GLUCOSE 189*  BUN 11  CREATININE 2.91*  CALCIUM 8.8*  GFRNONAA 14*  GFRAA 16*  ANIONGAP 11    No results for input(s): PROT, ALBUMIN, AST, ALT, ALKPHOS, BILITOT  in the last 168 hours. Hematology Recent Labs  Lab 05/31/19 1129  WBC 4.3  RBC 4.11  HGB 12.5  HCT 40.3  MCV 98.1  MCH 30.4  MCHC 31.0  RDW 17.5*  PLT 187   BNPNo results for input(s): BNP, PROBNP in the last 168 hours.  DDimer No results for input(s): DDIMER in the last 168 hours.   Radiology/Studies:  Dg Chest Port 1 View  Result Date: 05/31/2019 CLINICAL DATA:  Chest pain EXAM: PORTABLE CHEST 1 VIEW COMPARISON:  March 17 2019 FINDINGS: Pulmonary vascular congestion. Possible trace right pleural effusion. No pneumothorax. Cardiomegaly. IMPRESSION: Pulmonary vascular congestion. Possible trace right pleural effusion. Cardiomegaly. Electronically Signed   By: Macy Mis M.D.   On: 05/31/2019 10:24    Assessment and Plan:   Chest pain/CAD s/p CABG 2017 and recent PCI H/o  of recent 01/10/19 PTCA followed by overlapping DES instent re-stenosis SVG-OM and DES ostial-prox SVG-RI and recath 02/11/19 with stable anatomy, RCA treated medically. Patient had an episode of chest pain during HD that was relieved with NTG x 5. CODE STEMI canceled on arrival due to EKG with no consistent findings. HS troponin 79. CXR showed pulmonary vascular congestion and trace right pleural effusion. BP was elevated, systolics to [id number] (patient did not take her meds before HD) - Patient is currently chest pain free. The patient and her daughter feel this chest pain might be secondary to an extra dialysis session. This is sometimes a reoccurring event with HD. - Home meds include Aspirin 81, Coreg 12.5 mg, Plavix 75 mg daily, Imdur, Atorvastatin 80 mg.  - Pressure elevated on arrival, but patient reports not taking her medications before HD. Restart. Change Imdur to 120 mg daily (not different on HD days) - First troponin 79>> continue to trend - Given recent cath do not think we will repeat at this time. Can consider if troponins trend up or chest pain returns. Will discuss plan with MD - With HD tomorrow monitor for reoccurring chest pain  HTN - Pressures elevated on arrival but history of hypotension on dialysis days. Patient did not take her meds before HD. - home meds include coreg, Imdur 120 mg MWF and 60 mg before and 60 mg after HD, hydralazine 25 mg TID - change Imdur to 120 mg daily (HD days not different). Monitor pressures  HLD - continue statin  Cardiomyopathy/Chronic combined Heart failure - Echo in July 2020 showed EF 30-35% - Volume status managed with HD. Patient completed her session today and planned for a repeat session tomorrow. CXR with pulmonary vascular congestion - Denies SOB. Euvolemic on exam. - Continue BB  ESRD on HD - HD schedule TThs, but this week was MWS with an extra today for extra fluid - HD tomorrow  Anemia -Hgb stable  For questions or updates,  please contact [facility] Please consult [email or web address] for contact info under     Signed, Krissi Willaims Ninfa Meeker, PA-C  05/31/2019 1:13 PM

## 2019-05-31 NOTE — Consult Note (Signed)
Medical Consultation   Monica [name]  HWE:[id number]  DOB: [birth year]  DOA: 05/31/2019  PCP: Benito Mccreedy, MD  Requesting physician: Dr. Gilford Raid from the emergency department  Reason for consultation: Possible admission for chest pains   History of Present Illness: Monica [name] is an 85 y.o. female with a complicated medical history including combined systolic and diastolic congestive heart failure, end-stage renal disease on hemodialysis, central hypertension, gastroesophageal reflux disease, external bradycardia, type 2 diabetes mellitus, and CABG in 2017 who presents the emergency department from hemodialysis.  Patient has had a couple admissions this year.  On her last admission cardiology saw her for unstable angina and increased her Imdur dosing giving her a dose of 60 mg before and after dialysis.  Patient has been compliant with that except for this morning she did not take her Imdur.  She went to dialysis and subsequently had some chest pains.  EMS was consulted and brought the patient in.  She received 5 sublingual nitroglycerin prior to arrival here and was without chest pain on arrival.  Patient has known inoperable coronary disease and is medically managed to the best of our ability.  He is very aware of her current situation and she and her daughter keep up with her medications very well.  He is currently without pain and requesting discharge home.     Review of Systems:  As per HPI otherwise 10 point review of systems negative.    Past Medical History: Past Medical History:  Diagnosis Date  . Anemia   . Asthma   . Chronic combined systolic and diastolic CHF (congestive heart failure) ([place])    a. Echo 11/19 [facility]):  EF 55-65, normal wall motion, normal diastolic function. b. Echo 01/2019 EF 30-35%, moderate MR, mild AI.  Marland Kitchen Coronary artery disease    a. s/p CABG 2017. b.s/p NSTEMI 11/19 [facility]) >> S-PDA 100 >>PCI: 2.25 x 24 mm Synergy DES  to S-OM. c. NSTEMI 01/2019 - occluded SVG-distal RCA, severe proximal to stent/ISR of SVG-OM s/p overlapping DES, DES to ostial prox SVG to the RI.   Marland Kitchen ESRD (end stage renal disease)    Dialysis Tu, Th, Sa  . Essential hypertension   . Gastroesophageal reflux disease   . History of blood transfusion   . Hyperlipidemia   . Junctional bradycardia   . Moderate mitral regurgitation   . Peptic ulcer   . Protein calorie malnutrition ([place])   . Sinus node dysfunction ([facility])   . Type 2 diabetes mellitus ([place])     Past Surgical History: Past Surgical History:  Procedure Laterality Date  . ABDOMINAL HYSTERECTOMY    . CARDIAC CATHETERIZATION N/A 09/17/2015   Procedure: Left Heart Cath and Coronary Angiography;  Surgeon: Jettie Booze, MD;  Location: [place] CV [facility];  Service: Cardiovascular;  Laterality: N/A;  . COLON SURGERY    . CORONARY ARTERY BYPASS GRAFT N/A 09/19/2015   Procedure: CORONARY ARTERY BYPASS GRAFTING (CABG) TIMES FOUR USING BILATARAL SAPHENOUS VEIN GRAFTS AND LEFT INTERNAL MAMMARY ARTERY;  Surgeon: [name], MD;  Location: [place];  Service: Open Heart Surgery;  Laterality: N/A;  . CORONARY STENT INTERVENTION N/A 01/10/2019   Procedure: CORONARY STENT INTERVENTION;  Surgeon: Leonie Man, MD;  Location: [place] [facility];  Service: Cardiovascular;  Laterality: N/A;  . LEFT HEART CATH AND CORS/GRAFTS ANGIOGRAPHY N/A 01/10/2019   Procedure: LEFT HEART CATH AND  CORS/GRAFTS ANGIOGRAPHY;  Surgeon: Leonie Man, MD;  Location: [place] [facility];  Service: Cardiovascular;  Laterality: N/A;  . LEFT HEART CATH AND CORS/GRAFTS ANGIOGRAPHY N/A 02/11/2019   Procedure: LEFT HEART CATH AND CORS/GRAFTS ANGIOGRAPHY;  Surgeon: Burnell Blanks, MD;  Location: [place] [facility];  Service: Cardiovascular;  Laterality: N/A;  . TEE WITHOUT CARDIOVERSION N/A 09/19/2015   Procedure: TRANSESOPHAGEAL ECHOCARDIOGRAM (TEE);  Surgeon: [name], MD;  Location: [place];  Service:  Open Heart Surgery;  Laterality: N/A;     Allergies:  No Known Allergies   Social History:  reports that she has never smoked. She has quit using smokeless tobacco.  Her smokeless tobacco use included snuff. She reports that she does not drink alcohol or use drugs.   Family History: Family History  Problem Relation Age of Onset  . Heart attack Father     Physical Exam: Vitals:   05/31/19 1515 05/31/19 1530 05/31/19 1545 05/31/19 1600  BP: (!) 170/80 (!) 160/73 (!) 175/81 (!) 189/88  Pulse: 83 85 85 87  Resp: '15 17 19 15  '$ Temp:      TempSrc:      SpO2: 100% 99% 100% 100%  Weight:      Height:        Constitutional: Elderly well-developed chronically ill alert and awake, oriented x3, not in any acute distress. Eyes: PERLA, EOMI, irises appear normal, anicteric sclera,  ENMT: external ears and nose appear normal, normal hearing             Lips appears normal, oropharynx mucosa, tongue, posterior pharynx appear normal  Neck: neck appears normal, no masses, normal ROM, no thyromegaly, no JVD  CVS: S1-S2 clear, no murmur rubs or gallops, no LE edema, normal pedal pulses  Respiratory:  clear to auscultation bilaterally, no wheezing, rales or rhonchi. Respiratory effort normal. No accessory muscle use.  Abdomen: soft nontender, nondistended, normal bowel sounds, no hepatosplenomegaly, no hernias  Musculoskeletal: : no cyanosis, clubbing or edema noted bilaterally, age-related arthropathy Neuro: Cranial nerves II-XII intact, strength, sensation, reflexes Psych: judgement and insight appear normal, stable mood and affect, mental status Skin: no rashes or lesions or ulcers, no induration or nodules    Data reviewed:  I have personally reviewed following labs and imaging studies Labs:  CBC: Recent Labs  Lab 05/31/19 1129  WBC 4.3  HGB 12.5  HCT 40.3  MCV 98.1  PLT 326    Basic Metabolic Panel: Recent Labs  Lab 05/31/19 1129  NA 137  K 4.7  CL 100  CO2 26   GLUCOSE 189*  BUN 11  CREATININE 2.91*  CALCIUM 8.8*   GFR Estimated Creatinine Clearance: 13 mL/min (A) (by C-G formula based on SCr of 2.91 mg/dL (H)). Urinalysis    Component Value Date/Time   COLORURINE YELLOW 01/17/2019 1920   APPEARANCEUR CLOUDY (A) 01/17/2019 1920   APPEARANCEUR Hazy 07/09/2012 0108   LABSPEC 1.010 01/17/2019 1920   LABSPEC 1.010 07/09/2012 0108   PHURINE 7.5 01/17/2019 1920   GLUCOSEU NEGATIVE 01/17/2019 1920   GLUCOSEU >=500 07/09/2012 0108   HGBUR MODERATE (A) 01/17/2019 1920   BILIRUBINUR NEGATIVE 01/17/2019 1920   BILIRUBINUR Negative 07/09/2012 0108   KETONESUR NEGATIVE 01/17/2019 1920   PROTEINUR 100 (A) 01/17/2019 1920   UROBILINOGEN 0.2 11/22/2012 1809   NITRITE POSITIVE (A) 01/17/2019 1920   LEUKOCYTESUR LARGE (A) 01/17/2019 1920   LEUKOCYTESUR 1+ 07/09/2012 0108     Microbiology No results found for this or any  previous visit (from the past 240 hour(s)).  Radiological Exams on Admission: Dg Chest Port 1 View  Result Date: 05/31/2019 CLINICAL DATA:  Chest pain EXAM: PORTABLE CHEST 1 VIEW COMPARISON:  March 17 2019 FINDINGS: Pulmonary vascular congestion. Possible trace right pleural effusion. No pneumothorax. Cardiomegaly. IMPRESSION: Pulmonary vascular congestion. Possible trace right pleural effusion. Cardiomegaly. Electronically Signed   By: Macy Mis M.D.   On: 05/31/2019 10:24    Impression/Recommendations Active Problems:   Angina pectoris ([place])   ESRD on hemodialysis ([place])  1.  Angina pectoris: Patient met with Dr. Radford Pax in the emergency department.  Dr. Radford Pax suggest that she take 2 of her Imdur is in the morning giving her a dose of 120 prior to dialysis.  Previously she had been taking 60 mg before and 60 mg after dialysis.  Patient is in agreement with this plan.  She plans to make sure she takes her Imdur prior to each of her dialysis sessions.  Unfortunately she missed it this morning and that may have  precipitated her symptoms.  2.  End-stage renal disease on hemodialysis: Additionally complicating the situation is that this is Thanksgiving week and patient is having extra dialyses because she will be missing sessions due to Thanksgiving day.  Daughter states that whenever she gets back to back sessions she gets into some difficulties.  We will continue to monitor the patient.  Patient and her daughter are both requesting discharge home.  She feels well and looks well.  Laboratory data is reassuring, troponin is slightly elevated but she often is elevated he is currently denying any chest pain.  She has a mildly elevated BNP but is scheduled for another session of dialysis tomorrow.  Patient and her daughter both promised to return if she develops any other symptoms.  At this point given patient's choice I think it is reasonable for her to discharge home and follow-up with dialysis tomorrow.  She knows to return if she has any difficulty whatsoever. Thank you for this consultation.     Time Spent: 29 minutes  Lady Deutscher M.D. [facility] 05/31/2019, 4:13 PM

## 2019-05-31 NOTE — Discharge Instructions (Signed)
Change the Imdur to 120 mg every day as discussed with cardiology or discuss with Dr. [name].

## 2019-05-31 NOTE — Consult Note (Signed)
Interventional Cardiology   We became involved with an STEMI activation occurred in this 85 year old dialysis dependent female with history of prior coronary bypass grafting who began having chest discomfort during dialysis.  This is her second consecutive day of dialysis.  Once discomfort started, 3 sublingual nitroglycerin did not relieve the discomfort.  In route to the emergency department 2 additional sublingual nitroglycerin tablets led to pain relief.  Upon arrival in the emergency room, the patient is pain-free.  The blood pressure is extremely elevated at and 183/90 mmHg.  No respiratory distress and pulse oximetry is 99%.  EKG does not reveal evidence of STEMI.  The tracing was personally reviewed.  The tracing performed 05/31/2019 at 10:07 AM demonstrates sinus rhythm, poor R wave progression V1 through V4, J-point depression, no diagnostic ST elevation compatible with ST elevation MI.  STEMI activation was canceled.  We are available if further questions.

## 2019-05-31 NOTE — Telephone Encounter (Signed)
Pt currently in ED.

## 2019-05-31 NOTE — ED Triage Notes (Signed)
Pt BIB [facility] from dialysis. Pt complaining of mid-sternal chest pain starting an hour prior to EMS arrival. Pt received x3 nitro @ dialysis and x2 nitro and 324 asa via EMS. Pt not complaining of any pain upon arrival to room. VSS. NAD.

## 2019-05-31 NOTE — Telephone Encounter (Signed)
Left message to call back  

## 2019-05-31 NOTE — ED Notes (Signed)
Pt discharge instructions reviewed with the patient. Pt verbalized understanding of instructions. Pt discharged. 

## 2019-05-31 NOTE — Telephone Encounter (Signed)
Pt c/o of Chest Pain: STAT if CP now or developed within 24 hours  1. Are you having CP right now? Yes- Daughter said she did not know anything else or any of the answers to the questions listed here. She said her Mom was at Dialysis and they contcted her. Pt wanted to be seen here today instead of going to the ER  2. Are you experiencing any other symptoms (ex. SOB, nausea, vomiting, sweating)?   3. How long have you been experiencing CP?  4. Is your CP continuous or coming and going?   5. Have you taken Nitroglycerin? ?

## 2019-05-31 NOTE — ED Provider Notes (Signed)
  Physical Exam  BP (!) 189/88   Pulse 87   Temp 98.2 F (36.8 C) (Oral)   Resp 15   Ht 5\' 2"  (1.575 m)   Wt 73.5 kg   SpO2 100%   BMI 29.63 kg/m   Physical Exam  ED Course/Procedures     Procedures  MDM  Dr. Evangeline Gula from hospitalist is seen patient.  Patient requests not to be admitted to the hospital.  They think this is safe.  Troponins have been stable.  Cardiology had planned to adjust Imdur.  Talked to patient about this.  Discharge home.       Davonna Belling, MD 05/31/19 [phone]

## 2019-05-31 NOTE — Progress Notes (Signed)
VAST consulted to place IV. One IV nurse attempted. 2nd IV nurse to assess when ED staff obtained PIV in hand.

## 2019-06-03 DIAGNOSIS — D631 Anemia in chronic kidney disease: Secondary | ICD-10-CM | POA: Diagnosis not present

## 2019-06-03 DIAGNOSIS — N186 End stage renal disease: Secondary | ICD-10-CM | POA: Diagnosis not present

## 2019-06-03 DIAGNOSIS — N2581 Secondary hyperparathyroidism of renal origin: Secondary | ICD-10-CM | POA: Diagnosis not present

## 2019-06-04 DIAGNOSIS — N186 End stage renal disease: Secondary | ICD-10-CM | POA: Diagnosis not present

## 2019-06-04 DIAGNOSIS — D631 Anemia in chronic kidney disease: Secondary | ICD-10-CM | POA: Diagnosis not present

## 2019-06-04 DIAGNOSIS — N2581 Secondary hyperparathyroidism of renal origin: Secondary | ICD-10-CM | POA: Diagnosis not present

## 2019-06-06 DIAGNOSIS — D631 Anemia in chronic kidney disease: Secondary | ICD-10-CM | POA: Diagnosis not present

## 2019-06-06 DIAGNOSIS — N2581 Secondary hyperparathyroidism of renal origin: Secondary | ICD-10-CM | POA: Diagnosis not present

## 2019-06-06 DIAGNOSIS — N186 End stage renal disease: Secondary | ICD-10-CM | POA: Diagnosis not present

## 2019-06-06 DIAGNOSIS — Z992 Dependence on renal dialysis: Secondary | ICD-10-CM | POA: Diagnosis not present

## 2019-06-08 DIAGNOSIS — N2581 Secondary hyperparathyroidism of renal origin: Secondary | ICD-10-CM | POA: Diagnosis not present

## 2019-06-08 DIAGNOSIS — M109 Gout, unspecified: Secondary | ICD-10-CM | POA: Diagnosis not present

## 2019-06-08 DIAGNOSIS — D509 Iron deficiency anemia, unspecified: Secondary | ICD-10-CM | POA: Diagnosis not present

## 2019-06-08 DIAGNOSIS — E119 Type 2 diabetes mellitus without complications: Secondary | ICD-10-CM | POA: Diagnosis not present

## 2019-06-08 DIAGNOSIS — N186 End stage renal disease: Secondary | ICD-10-CM | POA: Diagnosis not present

## 2019-06-08 DIAGNOSIS — D631 Anemia in chronic kidney disease: Secondary | ICD-10-CM | POA: Diagnosis not present

## 2019-06-10 DIAGNOSIS — D631 Anemia in chronic kidney disease: Secondary | ICD-10-CM | POA: Diagnosis not present

## 2019-06-10 DIAGNOSIS — N2581 Secondary hyperparathyroidism of renal origin: Secondary | ICD-10-CM | POA: Diagnosis not present

## 2019-06-10 DIAGNOSIS — D509 Iron deficiency anemia, unspecified: Secondary | ICD-10-CM | POA: Diagnosis not present

## 2019-06-10 DIAGNOSIS — N186 End stage renal disease: Secondary | ICD-10-CM | POA: Diagnosis not present

## 2019-06-13 DIAGNOSIS — D509 Iron deficiency anemia, unspecified: Secondary | ICD-10-CM | POA: Diagnosis not present

## 2019-06-13 DIAGNOSIS — D631 Anemia in chronic kidney disease: Secondary | ICD-10-CM | POA: Diagnosis not present

## 2019-06-13 DIAGNOSIS — N186 End stage renal disease: Secondary | ICD-10-CM | POA: Diagnosis not present

## 2019-06-13 DIAGNOSIS — N2581 Secondary hyperparathyroidism of renal origin: Secondary | ICD-10-CM | POA: Diagnosis not present

## 2019-06-14 DIAGNOSIS — J45901 Unspecified asthma with (acute) exacerbation: Secondary | ICD-10-CM | POA: Diagnosis not present

## 2019-06-14 DIAGNOSIS — E1122 Type 2 diabetes mellitus with diabetic chronic kidney disease: Secondary | ICD-10-CM | POA: Diagnosis not present

## 2019-06-14 DIAGNOSIS — D631 Anemia in chronic kidney disease: Secondary | ICD-10-CM | POA: Diagnosis not present

## 2019-06-14 DIAGNOSIS — M216X9 Other acquired deformities of unspecified foot: Secondary | ICD-10-CM | POA: Diagnosis not present

## 2019-06-14 DIAGNOSIS — N186 End stage renal disease: Secondary | ICD-10-CM | POA: Diagnosis not present

## 2019-06-14 DIAGNOSIS — I5043 Acute on chronic combined systolic (congestive) and diastolic (congestive) heart failure: Secondary | ICD-10-CM | POA: Diagnosis not present

## 2019-06-14 DIAGNOSIS — I132 Hypertensive heart and chronic kidney disease with heart failure and with stage 5 chronic kidney disease, or end stage renal disease: Secondary | ICD-10-CM | POA: Diagnosis not present

## 2019-06-14 DIAGNOSIS — N2581 Secondary hyperparathyroidism of renal origin: Secondary | ICD-10-CM | POA: Diagnosis not present

## 2019-06-14 DIAGNOSIS — E1151 Type 2 diabetes mellitus with diabetic peripheral angiopathy without gangrene: Secondary | ICD-10-CM | POA: Diagnosis not present

## 2019-06-15 DIAGNOSIS — D631 Anemia in chronic kidney disease: Secondary | ICD-10-CM | POA: Diagnosis not present

## 2019-06-15 DIAGNOSIS — N186 End stage renal disease: Secondary | ICD-10-CM | POA: Diagnosis not present

## 2019-06-15 DIAGNOSIS — N2581 Secondary hyperparathyroidism of renal origin: Secondary | ICD-10-CM | POA: Diagnosis not present

## 2019-06-15 DIAGNOSIS — D509 Iron deficiency anemia, unspecified: Secondary | ICD-10-CM | POA: Diagnosis not present

## 2019-06-16 DIAGNOSIS — J45901 Unspecified asthma with (acute) exacerbation: Secondary | ICD-10-CM | POA: Diagnosis not present

## 2019-06-16 DIAGNOSIS — I132 Hypertensive heart and chronic kidney disease with heart failure and with stage 5 chronic kidney disease, or end stage renal disease: Secondary | ICD-10-CM | POA: Diagnosis not present

## 2019-06-16 DIAGNOSIS — N186 End stage renal disease: Secondary | ICD-10-CM | POA: Diagnosis not present

## 2019-06-16 DIAGNOSIS — M216X9 Other acquired deformities of unspecified foot: Secondary | ICD-10-CM | POA: Diagnosis not present

## 2019-06-16 DIAGNOSIS — D631 Anemia in chronic kidney disease: Secondary | ICD-10-CM | POA: Diagnosis not present

## 2019-06-16 DIAGNOSIS — I5043 Acute on chronic combined systolic (congestive) and diastolic (congestive) heart failure: Secondary | ICD-10-CM | POA: Diagnosis not present

## 2019-06-16 DIAGNOSIS — E1122 Type 2 diabetes mellitus with diabetic chronic kidney disease: Secondary | ICD-10-CM | POA: Diagnosis not present

## 2019-06-16 DIAGNOSIS — E1151 Type 2 diabetes mellitus with diabetic peripheral angiopathy without gangrene: Secondary | ICD-10-CM | POA: Diagnosis not present

## 2019-06-16 DIAGNOSIS — N2581 Secondary hyperparathyroidism of renal origin: Secondary | ICD-10-CM | POA: Diagnosis not present

## 2019-06-17 DIAGNOSIS — D509 Iron deficiency anemia, unspecified: Secondary | ICD-10-CM | POA: Diagnosis not present

## 2019-06-17 DIAGNOSIS — N186 End stage renal disease: Secondary | ICD-10-CM | POA: Diagnosis not present

## 2019-06-18 DIAGNOSIS — I5043 Acute on chronic combined systolic (congestive) and diastolic (congestive) heart failure: Secondary | ICD-10-CM | POA: Diagnosis not present

## 2019-06-18 DIAGNOSIS — J45909 Unspecified asthma, uncomplicated: Secondary | ICD-10-CM | POA: Diagnosis not present

## 2019-06-20 DIAGNOSIS — D509 Iron deficiency anemia, unspecified: Secondary | ICD-10-CM | POA: Diagnosis not present

## 2019-06-20 DIAGNOSIS — E785 Hyperlipidemia, unspecified: Secondary | ICD-10-CM | POA: Diagnosis not present

## 2019-06-20 DIAGNOSIS — N186 End stage renal disease: Secondary | ICD-10-CM | POA: Diagnosis not present

## 2019-06-20 DIAGNOSIS — I10 Essential (primary) hypertension: Secondary | ICD-10-CM | POA: Diagnosis not present

## 2019-06-20 DIAGNOSIS — Z114 Encounter for screening for human immunodeficiency virus [HIV]: Secondary | ICD-10-CM | POA: Diagnosis not present

## 2019-06-20 DIAGNOSIS — E1165 Type 2 diabetes mellitus with hyperglycemia: Secondary | ICD-10-CM | POA: Diagnosis not present

## 2019-06-21 ENCOUNTER — Emergency Department (HOSPITAL_BASED_OUTPATIENT_CLINIC_OR_DEPARTMENT_OTHER): Payer: Medicare HMO

## 2019-06-21 ENCOUNTER — Encounter (HOSPITAL_BASED_OUTPATIENT_CLINIC_OR_DEPARTMENT_OTHER): Payer: Self-pay | Admitting: Emergency Medicine

## 2019-06-21 ENCOUNTER — Other Ambulatory Visit: Payer: Self-pay

## 2019-06-21 ENCOUNTER — Inpatient Hospital Stay (HOSPITAL_BASED_OUTPATIENT_CLINIC_OR_DEPARTMENT_OTHER)
Admission: EM | Admit: 2019-06-21 | Discharge: 2019-06-22 | DRG: 280 | Disposition: A | Discharge status: Home / Self Care | Payer: Medicare HMO | Attending: Internal Medicine | Admitting: Internal Medicine

## 2019-06-21 DIAGNOSIS — I214 Non-ST elevation (NSTEMI) myocardial infarction: Secondary | ICD-10-CM | POA: Diagnosis not present

## 2019-06-21 DIAGNOSIS — R079 Chest pain, unspecified: Secondary | ICD-10-CM | POA: Diagnosis not present

## 2019-06-21 DIAGNOSIS — I5042 Chronic combined systolic (congestive) and diastolic (congestive) heart failure: Secondary | ICD-10-CM | POA: Diagnosis present

## 2019-06-21 DIAGNOSIS — E1151 Type 2 diabetes mellitus with diabetic peripheral angiopathy without gangrene: Secondary | ICD-10-CM | POA: Diagnosis present

## 2019-06-21 DIAGNOSIS — Z20828 Contact with and (suspected) exposure to other viral communicable diseases: Secondary | ICD-10-CM | POA: Diagnosis not present

## 2019-06-21 DIAGNOSIS — E8889 Other specified metabolic disorders: Secondary | ICD-10-CM | POA: Diagnosis present

## 2019-06-21 DIAGNOSIS — Z90710 Acquired absence of both cervix and uterus: Secondary | ICD-10-CM | POA: Diagnosis not present

## 2019-06-21 DIAGNOSIS — D631 Anemia in chronic kidney disease: Secondary | ICD-10-CM | POA: Diagnosis not present

## 2019-06-21 DIAGNOSIS — Z992 Dependence on renal dialysis: Secondary | ICD-10-CM | POA: Diagnosis not present

## 2019-06-21 DIAGNOSIS — Z955 Presence of coronary angioplasty implant and graft: Secondary | ICD-10-CM

## 2019-06-21 DIAGNOSIS — T82858A Stenosis of vascular prosthetic devices, implants and grafts, initial encounter: Principal | ICD-10-CM | POA: Diagnosis present

## 2019-06-21 DIAGNOSIS — Z7902 Long term (current) use of antithrombotics/antiplatelets: Secondary | ICD-10-CM

## 2019-06-21 DIAGNOSIS — I25700 Atherosclerosis of coronary artery bypass graft(s), unspecified, with unstable angina pectoris: Secondary | ICD-10-CM

## 2019-06-21 DIAGNOSIS — Z7982 Long term (current) use of aspirin: Secondary | ICD-10-CM

## 2019-06-21 DIAGNOSIS — E785 Hyperlipidemia, unspecified: Secondary | ICD-10-CM | POA: Diagnosis present

## 2019-06-21 DIAGNOSIS — Z5329 Procedure and treatment not carried out because of patient's decision for other reasons: Secondary | ICD-10-CM | POA: Diagnosis not present

## 2019-06-21 DIAGNOSIS — E1122 Type 2 diabetes mellitus with diabetic chronic kidney disease: Secondary | ICD-10-CM | POA: Diagnosis present

## 2019-06-21 DIAGNOSIS — I10 Essential (primary) hypertension: Secondary | ICD-10-CM | POA: Diagnosis not present

## 2019-06-21 DIAGNOSIS — Z8711 Personal history of peptic ulcer disease: Secondary | ICD-10-CM | POA: Diagnosis not present

## 2019-06-21 DIAGNOSIS — I259 Chronic ischemic heart disease, unspecified: Secondary | ICD-10-CM

## 2019-06-21 DIAGNOSIS — I340 Nonrheumatic mitral (valve) insufficiency: Secondary | ICD-10-CM | POA: Diagnosis present

## 2019-06-21 DIAGNOSIS — I495 Sick sinus syndrome: Secondary | ICD-10-CM | POA: Diagnosis present

## 2019-06-21 DIAGNOSIS — N186 End stage renal disease: Secondary | ICD-10-CM | POA: Diagnosis not present

## 2019-06-21 DIAGNOSIS — N2581 Secondary hyperparathyroidism of renal origin: Secondary | ICD-10-CM | POA: Diagnosis not present

## 2019-06-21 DIAGNOSIS — I447 Left bundle-branch block, unspecified: Secondary | ICD-10-CM | POA: Diagnosis present

## 2019-06-21 DIAGNOSIS — E1165 Type 2 diabetes mellitus with hyperglycemia: Secondary | ICD-10-CM | POA: Diagnosis present

## 2019-06-21 DIAGNOSIS — N250 Renal osteodystrophy: Secondary | ICD-10-CM | POA: Diagnosis not present

## 2019-06-21 DIAGNOSIS — I252 Old myocardial infarction: Secondary | ICD-10-CM

## 2019-06-21 DIAGNOSIS — K219 Gastro-esophageal reflux disease without esophagitis: Secondary | ICD-10-CM | POA: Diagnosis present

## 2019-06-21 DIAGNOSIS — U071 COVID-19: Secondary | ICD-10-CM | POA: Diagnosis not present

## 2019-06-21 DIAGNOSIS — I5041 Acute combined systolic (congestive) and diastolic (congestive) heart failure: Secondary | ICD-10-CM

## 2019-06-21 DIAGNOSIS — Z8249 Family history of ischemic heart disease and other diseases of the circulatory system: Secondary | ICD-10-CM

## 2019-06-21 DIAGNOSIS — E1169 Type 2 diabetes mellitus with other specified complication: Secondary | ICD-10-CM | POA: Diagnosis not present

## 2019-06-21 DIAGNOSIS — Z9114 Patient's other noncompliance with medication regimen: Secondary | ICD-10-CM

## 2019-06-21 DIAGNOSIS — Z79899 Other long term (current) drug therapy: Secondary | ICD-10-CM

## 2019-06-21 DIAGNOSIS — I440 Atrioventricular block, first degree: Secondary | ICD-10-CM | POA: Diagnosis present

## 2019-06-21 DIAGNOSIS — Y712 Prosthetic and other implants, materials and accessory cardiovascular devices associated with adverse incidents: Secondary | ICD-10-CM | POA: Diagnosis present

## 2019-06-21 DIAGNOSIS — I132 Hypertensive heart and chronic kidney disease with heart failure and with stage 5 chronic kidney disease, or end stage renal disease: Secondary | ICD-10-CM | POA: Diagnosis present

## 2019-06-21 DIAGNOSIS — I472 Ventricular tachycardia: Secondary | ICD-10-CM | POA: Diagnosis not present

## 2019-06-21 DIAGNOSIS — I25119 Atherosclerotic heart disease of native coronary artery with unspecified angina pectoris: Secondary | ICD-10-CM | POA: Diagnosis present

## 2019-06-21 DIAGNOSIS — I120 Hypertensive chronic kidney disease with stage 5 chronic kidney disease or end stage renal disease: Secondary | ICD-10-CM | POA: Diagnosis not present

## 2019-06-21 DIAGNOSIS — R451 Restlessness and agitation: Secondary | ICD-10-CM | POA: Diagnosis not present

## 2019-06-21 DIAGNOSIS — Z79890 Hormone replacement therapy (postmenopausal): Secondary | ICD-10-CM

## 2019-06-21 LAB — COMPREHENSIVE METABOLIC PANEL
ALT: 57 U/L — ABNORMAL HIGH (ref 0–44)
AST: 101 U/L — ABNORMAL HIGH (ref 15–41)
Albumin: 3.7 g/dL (ref 3.5–5.0)
Alkaline Phosphatase: 134 U/L — ABNORMAL HIGH (ref 38–126)
Anion gap: 16 — ABNORMAL HIGH (ref 5–15)
BUN: 29 mg/dL — ABNORMAL HIGH (ref 8–23)
CO2: 22 mmol/L (ref 22–32)
Calcium: 9.1 mg/dL (ref 8.9–10.3)
Chloride: 97 mmol/L — ABNORMAL LOW (ref 98–111)
Creatinine, Ser: 4.39 mg/dL — ABNORMAL HIGH (ref 0.44–1.00)
GFR calc Af Amer: 10 mL/min — ABNORMAL LOW (ref >60)
GFR calc non Af Amer: 9 mL/min — ABNORMAL LOW (ref >60)
Glucose, Bld: 399 mg/dL — ABNORMAL HIGH (ref 70–99)
Potassium: 5.5 mmol/L — ABNORMAL HIGH (ref 3.5–5.1)
Sodium: 135 mmol/L (ref 135–145)
Total Bilirubin: 0.7 mg/dL (ref 0.3–1.2)
Total Protein: 7.8 g/dL (ref 6.5–8.1)

## 2019-06-21 LAB — CBC WITH DIFFERENTIAL/PLATELET
Abs Immature Granulocytes: 0.02 10*3/uL (ref 0.00–0.07)
Basophils Absolute: 0.1 10*3/uL (ref 0.0–0.1)
Basophils Relative: 1 %
Eosinophils Absolute: 0.1 10*3/uL (ref 0.0–0.5)
Eosinophils Relative: 1 %
HCT: 44.9 % (ref 36.0–46.0)
Hemoglobin: 13.7 g/dL (ref 12.0–15.0)
Immature Granulocytes: 0 %
Lymphocytes Relative: 19 %
Lymphs Abs: 1.3 10*3/uL (ref 0.7–4.0)
MCH: 30 pg (ref 26.0–34.0)
MCHC: 30.5 g/dL (ref 30.0–36.0)
MCV: 98.5 fL (ref 80.0–100.0)
Monocytes Absolute: 0.3 10*3/uL (ref 0.1–1.0)
Monocytes Relative: 4 %
Neutro Abs: 4.9 10*3/uL (ref 1.7–7.7)
Neutrophils Relative %: 75 %
Platelets: 261 10*3/uL (ref 150–400)
RBC: 4.56 MIL/uL (ref 3.87–5.11)
RDW: 18 % — ABNORMAL HIGH (ref 11.5–15.5)
WBC: 6.6 10*3/uL (ref 4.0–10.5)
nRBC: 0 % (ref 0.0–0.2)

## 2019-06-21 LAB — HEMOGLOBIN A1C
Hgb A1c MFr Bld: 9.6 % — ABNORMAL HIGH (ref 4.8–5.6)
Mean Plasma Glucose: 228.82 mg/dL

## 2019-06-21 LAB — GLUCOSE, CAPILLARY
Glucose-Capillary: 419 mg/dL — ABNORMAL HIGH (ref 70–99)
Glucose-Capillary: 298 mg/dL — ABNORMAL HIGH (ref 70–99)
Glucose-Capillary: 137 mg/dL — ABNORMAL HIGH (ref 70–99)

## 2019-06-21 LAB — TROPONIN I (HIGH SENSITIVITY)
Troponin I (High Sensitivity): 29 ng/L — ABNORMAL HIGH (ref <18)
Troponin I (High Sensitivity): 275 ng/L (ref <18)
Troponin I (High Sensitivity): 3568 ng/L (ref <18)
Troponin I (High Sensitivity): 8971 ng/L (ref <18)

## 2019-06-21 LAB — MRSA PCR SCREENING: MRSA by PCR: NEGATIVE

## 2019-06-21 LAB — SARS CORONAVIRUS 2 (TAT 6-24 HRS): SARS Coronavirus 2: POSITIVE — AB

## 2019-06-21 LAB — TSH: TSH: 4.803 u[IU]/mL — ABNORMAL HIGH (ref 0.350–4.500)

## 2019-06-21 LAB — SARS CORONAVIRUS 2 AG (30 MIN TAT): SARS Coronavirus 2 Ag: NEGATIVE

## 2019-06-21 LAB — HEPARIN LEVEL (UNFRACTIONATED): Heparin Unfractionated: 0.23 [IU]/mL — ABNORMAL LOW (ref 0.30–0.70)

## 2019-06-21 MED ORDER — HYDRALAZINE HCL 25 MG PO TABS
25.0000 mg | ORAL_TABLET | Freq: Two times a day (BID) | ORAL | Status: DC
  Administered 2019-06-21: 25 mg via ORAL
  Filled 2019-06-21 (×2): qty 1

## 2019-06-21 MED ORDER — NITROGLYCERIN 0.4 MG SL SUBL
0.4000 mg | SUBLINGUAL_TABLET | SUBLINGUAL | Status: DC | PRN
  Administered 2019-06-21: 0.4 mg via SUBLINGUAL
  Filled 2019-06-21: qty 1

## 2019-06-21 MED ORDER — SODIUM CHLORIDE 0.9% FLUSH
3.0000 mL | Freq: Two times a day (BID) | INTRAVENOUS | Status: DC
  Administered 2019-06-21: 3 mL via INTRAVENOUS

## 2019-06-21 MED ORDER — ONDANSETRON HCL 4 MG/2ML IJ SOLN
INTRAMUSCULAR | Status: AC
  Administered 2019-06-21: 4 mg via INTRAVENOUS
  Filled 2019-06-21: qty 2

## 2019-06-21 MED ORDER — LATANOPROST 0.005 % OP SOLN
1.0000 [drp] | Freq: Every day | OPHTHALMIC | Status: DC
  Administered 2019-06-21: 1 [drp] via OPHTHALMIC
  Filled 2019-06-21: qty 2.5

## 2019-06-21 MED ORDER — ONDANSETRON HCL 4 MG/2ML IJ SOLN: 4.0000 mg | Freq: Four times a day (QID) | INTRAMUSCULAR | Status: DC | PRN

## 2019-06-21 MED ORDER — SODIUM CHLORIDE 0.9% FLUSH: 3.0000 mL | INTRAVENOUS | Status: DC | PRN

## 2019-06-21 MED ORDER — NEPRO/CARBSTEADY PO LIQD: 237.0000 mL | Freq: Three times a day (TID) | ORAL | Status: DC | PRN

## 2019-06-21 MED ORDER — HEPARIN BOLUS VIA INFUSION
2000.0000 [IU] | Freq: Once | INTRAVENOUS | Status: AC
  Administered 2019-06-21: 2000 [IU] via INTRAVENOUS

## 2019-06-21 MED ORDER — DOCUSATE SODIUM 283 MG RE ENEM
1.0000 | ENEMA | RECTAL | Status: DC | PRN
  Filled 2019-06-21: qty 1

## 2019-06-21 MED ORDER — FENTANYL CITRATE (PF) 100 MCG/2ML IJ SOLN
50.0000 ug | Freq: Once | INTRAMUSCULAR | Status: AC
  Administered 2019-06-21: 50 ug via INTRAVENOUS
  Filled 2019-06-21: qty 2

## 2019-06-21 MED ORDER — INSULIN ASPART 100 UNIT/ML ~~LOC~~ SOLN
20.0000 [IU] | Freq: Once | SUBCUTANEOUS | Status: AC
  Administered 2019-06-21: 20 [IU] via SUBCUTANEOUS

## 2019-06-21 MED ORDER — SODIUM CHLORIDE 0.9 % IV SOLN: 100.0000 mL | INTRAVENOUS | Status: DC | PRN

## 2019-06-21 MED ORDER — ACETAMINOPHEN 650 MG RE SUPP: 650.0000 mg | Freq: Four times a day (QID) | RECTAL | Status: DC | PRN

## 2019-06-21 MED ORDER — ATORVASTATIN CALCIUM 80 MG PO TABS
80.0000 mg | ORAL_TABLET | Freq: Every day | ORAL | Status: DC
  Administered 2019-06-21: 80 mg via ORAL
  Filled 2019-06-21: qty 1

## 2019-06-21 MED ORDER — CALCIUM CARBONATE ANTACID 1250 MG/5ML PO SUSP
500.0000 mg | Freq: Four times a day (QID) | ORAL | Status: DC | PRN
  Filled 2019-06-21: qty 5

## 2019-06-21 MED ORDER — FERRIC CITRATE 1 GM 210 MG(FE) PO TABS
210.0000 mg | ORAL_TABLET | Freq: Three times a day (TID) | ORAL | Status: DC
  Administered 2019-06-21: 210 mg via ORAL
  Filled 2019-06-21 (×5): qty 1

## 2019-06-21 MED ORDER — INSULIN ASPART 100 UNIT/ML ~~LOC~~ SOLN: 0.0000 [IU] | Freq: Every day | SUBCUTANEOUS | Status: DC

## 2019-06-21 MED ORDER — HEPARIN SODIUM (PORCINE) 1000 UNIT/ML DIALYSIS
1000.0000 [IU] | INTRAMUSCULAR | Status: DC | PRN
  Filled 2019-06-21: qty 1

## 2019-06-21 MED ORDER — NITROGLYCERIN IN D5W 200-5 MCG/ML-% IV SOLN
0.0000 ug/min | INTRAVENOUS | Status: DC
  Administered 2019-06-22: 40 ug/min via INTRAVENOUS
  Filled 2019-06-21: qty 250

## 2019-06-21 MED ORDER — ALLOPURINOL 100 MG PO TABS
100.0000 mg | ORAL_TABLET | Freq: Two times a day (BID) | ORAL | Status: DC
  Administered 2019-06-21: 100 mg via ORAL
  Filled 2019-06-21 (×2): qty 1

## 2019-06-21 MED ORDER — ALBUTEROL SULFATE (2.5 MG/3ML) 0.083% IN NEBU: 2.5000 mg | INHALATION_SOLUTION | Freq: Four times a day (QID) | RESPIRATORY_TRACT | Status: DC | PRN

## 2019-06-21 MED ORDER — ASPIRIN 81 MG PO CHEW
81.0000 mg | CHEWABLE_TABLET | ORAL | Status: AC
  Administered 2019-06-22: 81 mg via ORAL
  Filled 2019-06-21: qty 1

## 2019-06-21 MED ORDER — BRIMONIDINE TARTRATE 0.15 % OP SOLN
1.0000 [drp] | Freq: Two times a day (BID) | OPHTHALMIC | Status: DC
  Administered 2019-06-21: 1 [drp] via OPHTHALMIC
  Filled 2019-06-21: qty 5

## 2019-06-21 MED ORDER — CHLORHEXIDINE GLUCONATE CLOTH 2 % EX PADS: 6.0000 | MEDICATED_PAD | Freq: Every day | CUTANEOUS | Status: DC

## 2019-06-21 MED ORDER — ONDANSETRON HCL 4 MG/2ML IJ SOLN: 4.0000 mg | Freq: Once | INTRAMUSCULAR | Status: AC

## 2019-06-21 MED ORDER — ORAL CARE MOUTH RINSE: 15.0000 mL | Freq: Two times a day (BID) | OROMUCOSAL | Status: DC

## 2019-06-21 MED ORDER — ALBUTEROL SULFATE HFA 108 (90 BASE) MCG/ACT IN AERS: 2.0000 | INHALATION_SPRAY | Freq: Four times a day (QID) | RESPIRATORY_TRACT | Status: DC | PRN

## 2019-06-21 MED ORDER — LIDOCAINE-PRILOCAINE 2.5-2.5 % EX CREA
1.0000 "application " | TOPICAL_CREAM | CUTANEOUS | Status: DC | PRN
  Filled 2019-06-21: qty 5

## 2019-06-21 MED ORDER — MELATONIN 3 MG PO TABS
6.0000 mg | ORAL_TABLET | Freq: Every day | ORAL | Status: DC
  Administered 2019-06-21: 6 mg via ORAL
  Filled 2019-06-21 (×2): qty 2

## 2019-06-21 MED ORDER — SORBITOL 70 % SOLN: 30.0000 mL | Status: DC | PRN

## 2019-06-21 MED ORDER — SODIUM CHLORIDE 0.9 % IV SOLN
1.0000 g | Freq: Once | INTRAVENOUS | Status: DC
  Filled 2019-06-21: qty 10

## 2019-06-21 MED ORDER — ACETAMINOPHEN 325 MG PO TABS: 650.0000 mg | ORAL_TABLET | Freq: Four times a day (QID) | ORAL | Status: DC | PRN

## 2019-06-21 MED ORDER — SODIUM CHLORIDE 0.9 % IV SOLN: 250.0000 mL | INTRAVENOUS | Status: DC | PRN

## 2019-06-21 MED ORDER — ALBUTEROL SULFATE HFA 108 (90 BASE) MCG/ACT IN AERS
4.0000 | INHALATION_SPRAY | Freq: Once | RESPIRATORY_TRACT | Status: AC
  Administered 2019-06-21: 4 via RESPIRATORY_TRACT
  Filled 2019-06-21: qty 6.7

## 2019-06-21 MED ORDER — HYDROXYZINE HCL 25 MG PO TABS: 25.0000 mg | ORAL_TABLET | Freq: Three times a day (TID) | ORAL | Status: DC | PRN

## 2019-06-21 MED ORDER — CAMPHOR-MENTHOL 0.5-0.5 % EX LOTN
1.0000 "application " | TOPICAL_LOTION | Freq: Three times a day (TID) | CUTANEOUS | Status: DC | PRN
  Filled 2019-06-21: qty 222

## 2019-06-21 MED ORDER — LIDOCAINE HCL (PF) 1 % IJ SOLN: 5.0000 mL | INTRAMUSCULAR | Status: DC | PRN

## 2019-06-21 MED ORDER — CARVEDILOL 12.5 MG PO TABS
12.5000 mg | ORAL_TABLET | Freq: Two times a day (BID) | ORAL | Status: DC
  Administered 2019-06-21: 12.5 mg via ORAL
  Filled 2019-06-21 (×2): qty 1

## 2019-06-21 MED ORDER — SODIUM CHLORIDE 0.9 % IV SOLN: INTRAVENOUS | Status: DC

## 2019-06-21 MED ORDER — NITROGLYCERIN IN D5W 200-5 MCG/ML-% IV SOLN
0.0000 ug/min | INTRAVENOUS | Status: DC
  Administered 2019-06-21: 5 ug/min via INTRAVENOUS
  Filled 2019-06-21: qty 250

## 2019-06-21 MED ORDER — INSULIN ASPART 100 UNIT/ML ~~LOC~~ SOLN
0.0000 [IU] | Freq: Three times a day (TID) | SUBCUTANEOUS | Status: DC
  Administered 2019-06-21: 8 [IU] via SUBCUTANEOUS

## 2019-06-21 MED ORDER — CALCIUM GLUCONATE-NACL 1-0.675 GM/50ML-% IV SOLN
INTRAVENOUS | Status: AC
  Administered 2019-06-21: 1000 mg
  Filled 2019-06-21: qty 50

## 2019-06-21 MED ORDER — SODIUM CHLORIDE 0.9% FLUSH: 3.0000 mL | Freq: Two times a day (BID) | INTRAVENOUS | Status: DC

## 2019-06-21 MED ORDER — ASPIRIN EC 81 MG PO TBEC
81.0000 mg | DELAYED_RELEASE_TABLET | Freq: Every day | ORAL | Status: DC
  Filled 2019-06-21: qty 1

## 2019-06-21 MED ORDER — ZOLPIDEM TARTRATE 5 MG PO TABS: 5.0000 mg | ORAL_TABLET | Freq: Every evening | ORAL | Status: DC | PRN

## 2019-06-21 MED ORDER — SODIUM ZIRCONIUM CYCLOSILICATE 5 G PO PACK
5.0000 g | PACK | Freq: Once | ORAL | Status: AC
  Administered 2019-06-21: 5 g via ORAL
  Filled 2019-06-21: qty 1

## 2019-06-21 MED ORDER — PENTAFLUOROPROP-TETRAFLUOROETH EX AERO: 1.0000 "application " | INHALATION_SPRAY | CUTANEOUS | Status: DC | PRN

## 2019-06-21 MED ORDER — ASPIRIN 81 MG PO CHEW
324.0000 mg | CHEWABLE_TABLET | Freq: Once | ORAL | Status: AC
  Administered 2019-06-21: 324 mg via ORAL
  Filled 2019-06-21: qty 4

## 2019-06-21 MED ORDER — HEPARIN (PORCINE) 25000 UT/250ML-% IV SOLN
1050.0000 [IU]/h | INTRAVENOUS | Status: DC
  Administered 2019-06-21: 900 [IU]/h via INTRAVENOUS
  Filled 2019-06-21: qty 250

## 2019-06-21 MED ORDER — PANTOPRAZOLE SODIUM 20 MG PO TBEC
20.0000 mg | DELAYED_RELEASE_TABLET | Freq: Every day | ORAL | Status: DC
  Administered 2019-06-21: 20 mg via ORAL
  Filled 2019-06-21 (×2): qty 1

## 2019-06-21 MED ORDER — ALTEPLASE 2 MG IJ SOLR: 2.0000 mg | Freq: Once | INTRAMUSCULAR | Status: DC | PRN

## 2019-06-21 NOTE — Consult Note (Addendum)
Reason for Consult: To manage dialysis and dialysis related needs Referring Physician: Dr Westley Chandler is an 85 y.o. female.   HPI: Pt is an 58F with a PMH significant for ESRD on HD TTS, HTN, HLD, DM, CAD s/p CABG, CAD s/p mult PCIs who is now seen in consultation at the request of Dr. Lorin Mercy for management of ESRD and provision of HD.    Pt had a day of retrosternal chest pain starting yesterday after HD.  Came to ED.  EKG showing TWI inferolateral leads.  High sens trop is > 8000.  On heparin and nitro gtts.  She has been seen by cardiology--> medical therapy preferred by pt; they are trying to wean NTG gtt and consider cath if CP recurs.  Dialyzes at [facility] per chart; pt reports MWF.      Past Medical History:  Diagnosis Date  . Anemia   . Asthma   . Chronic combined systolic and diastolic CHF (congestive heart failure) ([place])    a. Echo 11/19 [facility]  EF 55-65, normal wall motion, normal diastolic function. b. Echo 01/2019 EF 30-35%, moderate MR, mild AI.  [place] Kitchen Coronary artery disease    a. s/p CABG 2017. b.s/p NSTEMI 11/[facility]) >> S-PDA 100 >>PCI: 2.25 x 24 mm Synergy DES to S-OM. c. NSTEMI 01/2019 - occluded SVG-distal RCA, severe proximal to stent/ISR of SVG-OM s/p overlapping DES, DES to ostial prox SVG to the RI.   Marland Kitchen ESRD (end stage renal disease)    Dialysis Tu, Th, Sa  . Essential hypertension   . Gastroesophageal reflux disease   . History of blood transfusion   . Hyperlipidemia   . Junctional bradycardia   . Moderate mitral regurgitation   . Peptic ulcer   . Protein calorie malnutrition ([place])   . Sinus node dysfunction (H[facility])   . Type 2 diabetes mellitus ([place])     Past Surgical History:  Procedure Laterality Date  . ABDOMINAL HYSTERECTOMY    . CARDIAC CATHETERIZATION N/A 09/17/2015   Procedure: Left Heart Cath and Coronary Angiography;  Surgeon: Jettie Booze, MD;  Location: [place] [facility];  Service: Cardiovascular;  Laterality: N/A;   . COLON SURGERY    . CORONARY ARTERY BYPASS GRAFT N/A 09/19/2015   Procedure: CORONARY ARTERY BYPASS GRAFTING (CABG) TIMES FOUR USING BILATARAL SAPHENOUS VEIN GRAFTS AND LEFT INTERNAL MAMMARY ARTERY;  Surgeon: [name], MD;  Location: [place];  Service: Open Heart Surgery;  Laterality: N/A;  . CORONARY STENT INTERVENTION N/A 01/10/2019   Procedure: CORONARY STENT INTERVENTION;  Surgeon: Leonie Man, MD;  Location: [place] [facility];  Service: Cardiovascular;  Laterality: N/A;  . LEFT HEART CATH AND CORS/GRAFTS ANGIOGRAPHY N/A 01/10/2019   Procedure: LEFT HEART CATH AND CORS/GRAFTS ANGIOGRAPHY;  Surgeon: Leonie Man, MD;  Location: [place] [facility];  Service: Cardiovascular;  Laterality: N/A;  . LEFT HEART CATH AND CORS/GRAFTS ANGIOGRAPHY N/A 02/11/2019   Procedure: LEFT HEART CATH AND CORS/GRAFTS ANGIOGRAPHY;  Surgeon: Burnell Blanks, MD;  Location: [place] [facility];  Service: Cardiovascular;  Laterality: N/A;  . TEE WITHOUT CARDIOVERSION N/A 09/19/2015   Procedure: TRANSESOPHAGEAL ECHOCARDIOGRAM (TEE);  Surgeon: [name], MD;  Location: [place];  Service: Open Heart Surgery;  Laterality: N/A;    Family History  Problem Relation Age of Onset  . Heart attack Father     Social History:  reports that she has never smoked. She has quit using smokeless  tobacco.  Her smokeless tobacco use included snuff. She reports that she does not drink alcohol or use drugs.  Allergies: No Known Allergies  Medications:  Scheduled: . allopurinol  100 mg Oral BID  . [START ON 06/22/2019] aspirin  81 mg Oral Pre-Cath  . [START ON 06/22/2019] aspirin  81 mg Oral Daily  . atorvastatin  80 mg Oral q1800  . brimonidine  1 drop Both Eyes BID  . carvedilol  12.5 mg Oral BID  . Chlorhexidine Gluconate Cloth  6 each Topical Daily  . ferric citrate  210 mg Oral TID WC  . hydrALAZINE  25 mg Oral BID  . insulin aspart  0-15 Units Subcutaneous TID WC  . insulin aspart  0-5 Units  Subcutaneous QHS  . latanoprost  1 drop Both Eyes QHS  . Melatonin  6 mg Oral QHS  . pantoprazole  20 mg Oral Daily  . sodium chloride flush  3 mL Intravenous Q12H  . sodium chloride flush  3 mL Intravenous Q12H    Results for orders placed or performed during the hospital encounter of 06/21/19 (from the past 48 hour(s))  Troponin I (High Sensitivity)     Status: Abnormal   Collection Time: 06/21/19  4:23 AM  Result Value Ref Range   Troponin I (High Sensitivity) 29 (H) <18 ng/L    Comment: (NOTE) Elevated high sensitivity troponin I (hsTnI) values and significant  changes across serial measurements may suggest ACS but many other  chronic and acute conditions are known to elevate hsTnI results.  Refer to the "Links" section for chest pain algorithms and additional  guidance. Performed at [facility], [address] [place], [place] [address]   CBC with Differential     Status: Abnormal   Collection Time: 06/21/19  4:23 AM  Result Value Ref Range   WBC 6.6 4.0 - 10.5 K/uL   RBC 4.56 3.87 - 5.11 MIL/uL   Hemoglobin 13.7 12.0 - 15.0 g/dL   HCT 44.9 36.0 - 46.0 %   MCV 98.5 80.0 - 100.0 fL   MCH 30.0 26.0 - 34.0 pg   MCHC 30.5 30.0 - 36.0 g/dL   RDW 18.0 (H) 11.5 - 15.5 %   Platelets 261 150 - 400 K/uL   nRBC 0.0 0.0 - 0.2 %   Neutrophils Relative % 75 %   Neutro Abs 4.9 1.7 - 7.7 K/uL   Lymphocytes Relative 19 %   Lymphs Abs 1.3 0.7 - 4.0 K/uL   Monocytes Relative 4 %   Monocytes Absolute 0.3 0.1 - 1.0 K/uL   Eosinophils Relative 1 %   Eosinophils Absolute 0.1 0.0 - 0.5 K/uL   Basophils Relative 1 %   Basophils Absolute 0.1 0.0 - 0.1 K/uL   Immature Granulocytes 0 %   Abs Immature Granulocytes 0.02 0.00 - 0.07 K/uL    Comment: Performed at [facility], [address] [place], [place] [address]  Comprehensive metabolic panel     Status: Abnormal   Collection Time: 06/21/19  4:50 AM  Result Value Ref Range   Sodium 135 135 - 145 mmol/L    Potassium 5.5 (H) 3.5 - 5.1 mmol/L    Comment: NO VISIBLE HEMOLYSIS   Chloride 97 (L) 98 - 111 mmol/L   CO2 22 22 - 32 mmol/L   Glucose, Bld 399 (H) 70 - 99 mg/dL   BUN 29 (H) 8 - 23 mg/dL   Creatinine, Ser 4.39 (H) 0.44 -  1.00 mg/dL   Calcium 9.1 8.9 - 10.3 mg/dL   Total Protein 7.8 6.5 - 8.1 g/dL   Albumin 3.7 3.5 - 5.0 g/dL   AST 101 (H) 15 - 41 U/L   ALT 57 (H) 0 - 44 U/L   Alkaline Phosphatase 134 (H) 38 - 126 U/L   Total Bilirubin 0.7 0.3 - 1.2 mg/dL   GFR calc non Af Amer 9 (L) >60 mL/min   GFR calc Af Amer 10 (L) >60 mL/min   Anion gap 16 (H) 5 - 15    Comment: Performed at [facility], [address] [place], [place] [address]  SARS Coronavirus 2 Ag (30 min TAT) - Nasal Swab (BD Veritor Kit)     Status: None   Collection Time: 06/21/19  5:36 AM   Specimen: Nasal Swab (BD Veritor Kit)  Result Value Ref Range   SARS Coronavirus 2 Ag NEGATIVE NEGATIVE    Comment: (NOTE) SARS-CoV-2 antigen NOT DETECTED.  Negative results are presumptive.  Negative results do not preclude SARS-CoV-2 infection and should not be used as the sole basis for treatment or other patient management decisions, including infection  control decisions, particularly in the presence of clinical signs and  symptoms consistent with COVID-19, or in those who have been in contact with the virus.  Negative results must be combined with clinical observations, patient history, and epidemiological information. The expected result is Negative. Fact Sheet for Patients: PodPark.tn Fact Sheet for Healthcare Providers: GiftContent.is This test is not yet approved or cleared by the [place] FDA and  has been authorized for detection and/or diagnosis of SARS-CoV-2 by FDA under an Emergency Use Authorization (EUA).  This EUA will remain in effect (meaning this test can be used) for the duration of  the COVID-19 de claration under Section  564(b)(1) of the Act, 21 U.S.C. section 360bbb-3(b)(1), unless the authorization is terminated or revoked sooner. Performed at [facility], [address] [place], [place] [address]   Troponin I (High Sensitivity)     Status: Abnormal   Collection Time: 06/21/19  7:44 AM  Result Value Ref Range   Troponin I (High Sensitivity) 275 (HH) <18 ng/L    Comment: CRITICAL RESULT CALLED TO, READ BACK BY AND VERIFIED WITH: CALLED TO S.GOUGE RN AT 0818 ON [id number] BY SROY (NOTE) Elevated high sensitivity troponin I (hsTnI) values and significant  changes across serial measurements may suggest ACS but many other  chronic and acute conditions are known to elevate hsTnI results.  Refer to the Links section for chest pain algorithms and additional  guidance. Performed at [facility], [address] [place], [place] [address]   SARS CORONAVIRUS 2 (TAT 6-24 HRS)     Status: Abnormal   Collection Time: 06/21/19  8:00 AM  Result Value Ref Range   SARS Coronavirus 2 POSITIVE (A) NEGATIVE    Comment: Email sent to L Berdik,RN [id number] 06/21/2019 D Bradley (NOTE) SARS-CoV-2 target nucleic acids are DETECTED. The SARS-CoV-2 RNA is generally detectable in upper and lower respiratory specimens during the acute phase of infection. Positive results are indicative of the presence of SARS-CoV-2 RNA. Clinical correlation with patient history and other diagnostic information is  necessary to determine patient infection status. Positive results do not rule out bacterial infection or co-infection with other viruses.  The expected result is Negative. Fact Sheet for Patients: SugarRoll.be Fact Sheet for Healthcare Providers: https://www.woods-mathews.com/ This test is not yet approved  or cleared by the [place] and  has been authorized for detection and/or diagnosis of SARS-CoV-2 by FDA under an Emergency Use Authorization (EUA). This EUA  will remain  in effect (meaning this test can be used) for the duration of the COVID-19 declarat ion under Section 564(b)(1) of the Act, 21 U.S.C. section 360bbb-3(b)(1), unless the authorization is terminated or revoked sooner. Performed at [facility], [place] [address], [place], [place] [address]   TSH     Status: Abnormal   Collection Time: 06/21/19 12:03 PM  Result Value Ref Range   TSH 4.803 (H) 0.350 - 4.500 uIU/mL    Comment: Performed by a 3rd Generation assay with a functional sensitivity of <=0.01 uIU/mL. Performed at [facility], [place] [address] [place], [place] [address]   Hemoglobin A1c     Status: Abnormal   Collection Time: 06/21/19 12:03 PM  Result Value Ref Range   Hgb A1c MFr Bld 9.6 (H) 4.8 - 5.6 %    Comment: (NOTE) Pre diabetes:          5.7%-6.4% Diabetes:              >6.4% Glycemic control for   <7.0% adults with diabetes    Mean Plasma Glucose 228.82 mg/dL    Comment: Performed at [facility] [address] [place], [place] [address]  Troponin I (High Sensitivity)     Status: Abnormal   Collection Time: 06/21/19  1:00 PM  Result Value Ref Range   Troponin I (High Sensitivity) 3,568 (HH) <18 ng/L    Comment: CRITICAL RESULT CALLED TO, READ BACK BY AND VERIFIED WITH: LAUER,J RN '@[id number]'$  ON [id number] BY FLEMINGS (NOTE) Elevated high sensitivity troponin I (hsTnI) values and significant  changes across serial measurements may suggest ACS but many other  chronic and acute conditions are known to elevate hsTnI results.  Refer to the Links section for chest pain algorithms and additional  guidance. Performed at [facility], [place] [address], [place], [place] [address]   Glucose, capillary     Status: Abnormal   Collection Time: 06/21/19  1:36 PM  Result Value Ref Range   Glucose-Capillary 419 (H) 70 - 99 mg/dL  Troponin I (High Sensitivity)     Status: Abnormal   Collection Time: 06/21/19  2:03 PM  Result Value Ref Range    Troponin I (High Sensitivity) 8,971 (HH) <18 ng/L    Comment: CRITICAL VALUE NOTED.  VALUE IS CONSISTENT WITH PREVIOUSLY REPORTED AND CALLED VALUE. (NOTE) Elevated high sensitivity troponin I (hsTnI) values and significant  changes across serial measurements may suggest ACS but many other  chronic and acute conditions are known to elevate hsTnI results.  Refer to the Links section for chest pain algorithms and additional  guidance. Performed at [facility], [place] [address], [place], [place] [address]   Heparin level (unfractionated)     Status: Abnormal   Collection Time: 06/21/19  4:33 PM  Result Value Ref Range   Heparin Unfractionated 0.23 (L) 0.30 - 0.70 IU/mL    Comment: (NOTE) If heparin results are below expected values, and patient dosage has  been confirmed, suggest follow up testing of antithrombin III levels. Performed at [facility], [place] [address], [place], [place] [address]   Glucose, capillary     Status: Abnormal   Collection Time: 06/21/19  4:45 PM  Result Value Ref Range   Glucose-Capillary 298 (H) 70 -  99 mg/dL    DG Chest Port 1 View  Result Date: 06/21/2019 CLINICAL DATA:  Midsternal chest pain. EXAM: PORTABLE CHEST 1 VIEW COMPARISON:  Radiograph 05/31/2019 FINDINGS: Post median sternotomy. Unchanged cardiomegaly. Unchanged mediastinal contours. Unchanged vascular congestion from prior exam. Blunting of the costophrenic angles consistent with small effusions. No pneumothorax. No focal airspace disease. IMPRESSION: Unchanged cardiomegaly, vascular congestion, and small pleural effusions. No acute findings. Electronically Signed   By: Keith Rake M.D.   On: 06/21/2019 05:33    ROS: all other systems reviewed and are negative except as per HPI Blood pressure (!) 145/77, pulse 79, temperature 98.5 F (36.9 C), temperature source Oral, resp. rate 18, height '5\' 2"'$  (1.575 m), weight 73.5 kg, SpO2 100 %. .  GEN NAD, sitting in bed HEENT EOMI  PERRL NECK no JVD PULM bibasilar crackles R > L CV RRR no m/r/g ABD soft nontender EXT no LE edema NEURO a little confused, nonfocal ACCESS LUE AVF + T/B  Assessment/Plan: 1 NSTEMI: on hep and nitro gtts per cardiology.  Medical therapy being pursued for now. 2 ESRD: For HD tomorrow 12/16.  Will need to get records in AM from [facility].  Got lokelma this AM for mild hyperK of 5.5. 3 Hypertension: well controlled on nitro gtt 4. Anemia of ESRD: hgb 13.7, no ESA indicated 5. Metabolic Bone Disease: binders/ vitamin when eating   Avalee Castrellon 06/21/2019, 8:16 PM

## 2019-06-21 NOTE — Progress Notes (Signed)
Patient's SPO2 dropped to 90%.  Placed patient on 2 liter nasal cannula.  Patients SPO2 increase to 100%.

## 2019-06-21 NOTE — ED Provider Notes (Addendum)
Emergency Department Provider Note   I have reviewed the triage vital signs and the nursing notes.   HISTORY  Chief Complaint Chest Pain   HPI Monica [name] is a 85 y.o. female with extensive past medical history as documented below who presents the emergency department today with retrosternal chest pain.  Started around 930 and is sharp in nature.  She has some shortness of breath same time.  She states she took 2 or 3 nitroglycerin at home which did not seem to help her symptoms at all.  She did have dialysis today which they removed 5 pounds which is her limit.  She states this does not feel like previous heart attacks.  Daughter states that she was having some nausea at home but did not throw up.  Patient states that she has been short of breath recently similar to asthma.   No other associated or modifying symptoms.    Past Medical History:  Diagnosis Date  . Anemia   . Asthma   . Chronic combined systolic and diastolic CHF (congestive heart failure) ([place])    a. Echo 11/19 [facility]):  EF 55-65, normal wall motion, normal diastolic function. b. Echo 01/2019 EF 30-35%, moderate MR, mild AI.  Marland Kitchen Coronary artery disease    a. s/p CABG 2017. b.s/p NSTEMI 11/19 [facility]) >> S-PDA 100 >>PCI: 2.25 x 24 mm Synergy DES to S-OM. c. NSTEMI 01/2019 - occluded SVG-distal RCA, severe proximal to stent/ISR of SVG-OM s/p overlapping DES, DES to ostial prox SVG to the RI.   Marland Kitchen ESRD (end stage renal disease)    Dialysis Tu, Th, Sa  . Essential hypertension   . Gastroesophageal reflux disease   . History of blood transfusion   . Hyperlipidemia   . Junctional bradycardia   . Moderate mitral regurgitation   . Peptic ulcer   . Protein calorie malnutrition ([place])   . Sinus node dysfunction (HCC)   . Type 2 diabetes mellitus [facility])     Patient Active Problem List   Diagnosis Date Noted  . Acute on chronic combined systolic and diastolic CHF (congestive heart failure) ([place]) 03/18/2019    . Anemia of chronic disease 03/17/2019  . ESRD on hemodialysis ([place]) 03/17/2019  . Sinus node dysfunction ([place]) 01/25/2019  . Ischemic cardiomyopathy 01/25/2019  . Hyperkalemia 01/18/2019  . Acute lower UTI 01/18/2019  . Elevated troponin   . Junctional bradycardia   . Bradycardia 01/17/2019  . S/P coronary artery stent placement   . Acute combined systolic and diastolic heart failure ([place])   . Non-ST elevation (NSTEMI) myocardial infarction ([place]) 01/08/2019  . Acute respiratory failure ([place]) 01/07/2019  . Diabetes mellitus with peripheral circulatory disorder ([place]) 02/05/2018  . Altered mental status 03/13/2017  . Claudication in peripheral vascular disease ([place]) 12/29/2016  . Abnormal weight loss 06/05/2016  . Bilateral pleural effusion 11/23/2015  . Chronic combined systolic and diastolic CHF (congestive heart failure) ([place]) 11/23/2015  . Dyspnea 11/23/2015  . Pleural effusion 11/23/2015  . CAD (coronary artery disease) 10/05/2015  . S/P CABG x 4 09/19/2015  . History of coronary artery bypass surgery 09/19/2015  . Prolonged Q-T interval on ECG 09/17/2015  . Angina pectoris ([place])   . Gastroesophageal reflux disease 09/15/2015  . Gout 09/15/2015  . Chest pain 09/15/2015  . Hyperlipidemia   . Diabetes mellitus with renal complications ([place])   . Obesity (BMI 30.0-34.9) 08/02/2015  . Asthma 04/27/2015  . Vitamin D deficiency 04/27/2015  . Hyperlipidemia associated with  type 2 diabetes mellitus ([place]) 04/27/2015  . Essential hypertension 04/24/2015  . ESRD (end stage renal disease) ([place]) 04/24/2015  . Type 2 diabetes mellitus with hyperglycemia ([place]) 04/24/2015  . Metatarsalgia of both feet 07/25/2014  . Equinus deformity of foot, acquired 07/25/2014  . Onychomycosis 07/25/2014  . Pain in lower limb 07/25/2014  . Acquired equinus deformity of foot 07/25/2014  . Metatarsalgia 07/25/2014  . Pain of lower extremity 07/25/2014    Past Surgical History:  Procedure Laterality  Date  . ABDOMINAL HYSTERECTOMY    . CARDIAC CATHETERIZATION N/A 09/17/2015   Procedure: Left Heart Cath and Coronary Angiography;  Surgeon: Jettie Booze, MD;  Location: [place] [facility];  Service: Cardiovascular;  Laterality: N/A;  . COLON SURGERY    . CORONARY ARTERY BYPASS GRAFT N/A 09/19/2015   Procedure: CORONARY ARTERY BYPASS GRAFTING (CABG) TIMES FOUR USING BILATARAL SAPHENOUS VEIN GRAFTS AND LEFT INTERNAL MAMMARY ARTERY;  Surgeon: [name], MD;  Location: [place];  Service: Open Heart Surgery;  Laterality: N/A;  . CORONARY STENT INTERVENTION N/A 01/10/2019   Procedure: CORONARY STENT INTERVENTION;  Surgeon: Leonie Man, MD;  Location: [place] [facility];  Service: Cardiovascular;  Laterality: N/A;  . LEFT HEART CATH AND CORS/GRAFTS ANGIOGRAPHY N/A 01/10/2019   Procedure: LEFT HEART CATH AND CORS/GRAFTS ANGIOGRAPHY;  Surgeon: Leonie Man, MD;  Location: [place] [facility];  Service: Cardiovascular;  Laterality: N/A;  . LEFT HEART CATH AND CORS/GRAFTS ANGIOGRAPHY N/A 02/11/2019   Procedure: LEFT HEART CATH AND CORS/GRAFTS ANGIOGRAPHY;  Surgeon: Burnell Blanks, MD;  Location: [place] [facility];  Service: Cardiovascular;  Laterality: N/A;  . TEE WITHOUT CARDIOVERSION N/A 09/19/2015   Procedure: TRANSESOPHAGEAL ECHOCARDIOGRAM (TEE);  Surgeon: [name], MD;  Location: [place];  Service: Open Heart Surgery;  Laterality: N/A;    Current Outpatient Rx  . Order #: [id number] Class: Print  . Order #: [id number] Class: Historical Med  . Order #: [id number] Class: Historical Med  . Order #: [id number] Class: No Print  . Order #: [id number] Class: Historical Med  . Order #: [id number] Class: Historical Med  . Order #: [id number] Class: Normal  . Order #: [id number] Class: Historical Med  . Order #: [id number] Class: Normal  . Order #: [id number] Class: Historical Med  . Order #: [id number] Class: Historical Med  . Order #: [id number] Class: Fill Later  . Order #: [id number] Class: Print   . Order #: [id number] Class: Historical Med  . Order #: [id number] Class: Historical Med  . Order #: [id number] Class: Historical Med  . Order #: [id number] Class: Historical Med  . Order #: [id number] Class: Normal  . Order #: [id number] Class: Historical Med    Allergies Patient has no known allergies.  Family History  Problem Relation Age of Onset  . Heart attack Father     Social History Social History   Tobacco Use  . Smoking status: Never Smoker  . Smokeless tobacco: Former Systems developer    Types: Snuff  Substance Use Topics  . Alcohol use: No    Alcohol/week: 0.0 standard drinks  . Drug use: No    Review of Systems  All other systems negative except as documented in the HPI. All pertinent positives and negatives as reviewed in the HPI. ____________________________________________   PHYSICAL EXAM:  VITAL SIGNS: Vitals:   06/21/19 0317 06/21/19 0502 06/21/19 0506  BP:  (!) 160/97   Pulse:  90   Resp:  18   Temp:   97.6 F (36.4 C)  TempSrc:   Oral  SpO2:  97%  Weight: 73.5 kg    Height: 5\' 2"  (1.575 m)      Constitutional: Alert and oriented. Well appearing and in no acute distress. Eyes: Conjunctivae are normal. PERRL. EOMI. Head: Atraumatic. Nose: No congestion/rhinnorhea. Mouth/Throat: Mucous membranes are moist.  Oropharynx non-erythematous. Neck: No stridor.  No meningeal signs.   Cardiovascular: Normal rate, regular rhythm. Good peripheral circulation. Grossly normal heart sounds.   Respiratory: tachypneic respiratory effort.  No retractions. Lungs CTAB. Gastrointestinal: Soft and nontender. No distention.  Musculoskeletal: No lower extremity tenderness nor edema. No gross deformities of extremities. Neurologic:  Normal speech and language. No gross focal neurologic deficits are appreciated.  Skin:  Skin is warm, diaphoretic but intact. No rash noted.  ____________________________________________   LABS (all labs ordered are listed, but only abnormal  results are displayed)  Labs Reviewed  CBC WITH DIFFERENTIAL/PLATELET  COMPREHENSIVE METABOLIC PANEL  TROPONIN I (HIGH SENSITIVITY)   ____________________________________________  EKG   EKG Interpretation  Date/Time:  Tuesday June 21 2019 03:21:53 EST Ventricular Rate:  95 PR Interval:    QRS Duration: 118 QT Interval:  419 QTC Calculation: 527 R Axis:   -27 Text Interpretation: Sinus or ectopic atrial rhythm Incomplete left bundle branch block Consider anterior infarct ST depr, consider ischemia, inferior leads Baseline wander in lead(s) II III aVL aVF V2 Technically poor tracing needs repeat as this is impossible to accurately interpret but does appear to have possible ischemic changes Confirmed by Merrily Pew [phone]) on 06/21/2019 3:28:42 AM       EKG Interpretation  Date/Time:  Tuesday June 21 2019 04:59:27 EST Ventricular Rate:  90 PR Interval:    QRS Duration: 121 QT Interval:  440 QTC Calculation: 539 R Axis:   54 Text Interpretation: Sinus rhythm Nonspecific intraventricular conduction delay Probable anterolateral infarct, old Repol abnrm, severe global ischemia (LM/MVD) different than multiple previous but similar to some no criteria for acute infarction currently Confirmed by Merrily Pew [phone]) on 06/21/2019 5:31:37 AM        ____________________________________________  RADIOLOGY  No results found.  ____________________________________________   PROCEDURES  Procedure(s) performed:   .Critical Care Performed by: Merrily Pew, MD Authorized by: Merrily Pew, MD   Critical care provider statement:    Critical care time (minutes):  45   Critical care was time spent personally by me on the following activities:  Discussions with consultants, evaluation of patient's response to treatment, examination of patient, ordering and performing treatments and interventions, ordering and review of laboratory studies, ordering and review of  radiographic studies, pulse oximetry, re-evaluation of patient's condition, obtaining history from patient or surrogate and review of old charts  Angiocath insertion  Date/Time: 06/21/2019 7:37 AM Performed by: Merrily Pew, MD Authorized by: Merrily Pew, MD  Consent: Verbal consent obtained. Risks and benefits: risks, benefits and alternatives were discussed Consent given by: patient Required items: required blood products, implants, devices, and special equipment available Patient identity confirmed: verbally with patient Time out: Immediately prior to procedure a "time out" was called to verify the correct patient, procedure, equipment, support staff and site/side marked as required. Preparation: Patient was prepped and draped in the usual sterile fashion. Local anesthesia used: no  Anesthesia: Local anesthesia used: no  Sedation: Patient sedated: no  Patient tolerance: patient tolerated the procedure well with no immediate complications Comments: US guided IV to right basilic vein with good blood return.     ____________________________________________   INITIAL IMPRESSION / ASSESSMENT AND PLAN / ED COURSE  Repeat ECG with  severe ischemic changes (diffuse depression) different from multiple previous. Elevation in aVR as well. Concern for possible proximal L main but not STEMI criteria currently. CP resolved. Troponin ok. K slightly elevated (calcium and lokelma ordered). Will d/w cardiology but likely medical admission.   Discussed abnormal ECG and labs with Dr. Marletta Lor with cardiology who recommends cycling troponin, hospitalist observation and they will see in consultation today.   hospitalist paged.   hospitalist paged again. D/w Dr. Myna Hidalgo, will admit.   Pertinent labs & imaging results that were available during my care of the patient were reviewed by me and considered in my medical decision making (see chart for details).   ____________________________________________  FINAL CLINICAL IMPRESSION(S) / ED DIAGNOSES  Final diagnoses:  Chest pain  Chest pain due to myocardial ischemia, unspecified ischemic chest pain type     MEDICATIONS GIVEN DURING THIS VISIT:  Medications  nitroGLYCERIN (NITROSTAT) SL tablet 0.4 mg (has no administration in time range)  fentaNYL (SUBLIMAZE) injection 50 mcg (has no administration in time range)  aspirin chewable tablet 324 mg (has no administration in time range)     NEW OUTPATIENT MEDICATIONS STARTED DURING THIS VISIT:  New Prescriptions   No medications on file    Note:  This note was prepared with assistance of Dragon voice recognition software. Occasional wrong-word or sound-a-like substitutions may have occurred due to the inherent limitations of voice recognition software.   Ouita Nish, Corene Cornea, MD 06/21/19 [id number]    Merrily Pew, MD 06/21/19 [phone]

## 2019-06-21 NOTE — Progress Notes (Addendum)
CRITICAL VALUE ALERT  Critical Value:  TROPONIN=3,568  Date & Time Notied:  06/21/2019 AT 1439  Provider Notified:  Reino Bellis NP via amion  Orders Received/Actions taken: call back from Reino Bellis, NP no new orders recieved

## 2019-06-21 NOTE — Progress Notes (Signed)
RT unable to obtain IV access. 

## 2019-06-21 NOTE — ED Notes (Signed)
2 attempts for IV access by this nurse; RT attempted X 2 with ultrasound; all unsuccessful. Dr Dayna Barker aware. Will continue to try.

## 2019-06-21 NOTE — ED Notes (Signed)
Patient had dark black stool in brief; patient cleaned and changed. Patient states she takes iron supplements.

## 2019-06-21 NOTE — ED Triage Notes (Signed)
Patient presents with complaints of mid sternal chest pain; states onset 2130; states had dialysis today; denies having chest pain while at dialysis.

## 2019-06-21 NOTE — Progress Notes (Signed)
ANTICOAGULATION CONSULT NOTE - Initial Consult  Pharmacy Consult for heparin Indication: chest pain/ACS  No Known Allergies  Patient Measurements: Height: 5\' 2"  (157.5 cm) Weight: 162 lb 0.6 oz (73.5 kg) IBW/kg (Calculated) : 50.1 Heparin Dosing Weight: 65kg  Vital Signs: Temp: 97.6 F (36.4 C) (12/15 0506) Temp Source: Oral (12/15 0506) BP: 161/87 (12/15 0645) Pulse Rate: 85 (12/15 0645)  Labs: Recent Labs    06/21/19 0423 06/21/19 0450  HGB 13.7  --   HCT 44.9  --   PLT 261  --   CREATININE  --  4.39*  TROPONINIHS 29*  --     Estimated Creatinine Clearance: 8.6 mL/min (A) (by C-G formula based on SCr of 4.39 mg/dL (H)).   Medical History: Past Medical History:  Diagnosis Date  . Anemia   . Asthma   . Chronic combined systolic and diastolic CHF (congestive heart failure) ([place])    a. Echo 11/19 [facility]):  EF 55-65, normal wall motion, normal diastolic function. b. Echo 01/2019 EF 30-35%, moderate MR, mild AI.  Marland Kitchen Coronary artery disease    a. s/p CABG 2017. b.s/p NSTEMI 11/19 [facility]) >> S-PDA 100 >>PCI: 2.25 x 24 mm Synergy DES to S-OM. c. NSTEMI 01/2019 - occluded SVG-distal RCA, severe proximal to stent/ISR of SVG-OM s/p overlapping DES, DES to ostial prox SVG to the RI.   Marland Kitchen ESRD (end stage renal disease)    Dialysis Tu, Th, Sa  . Essential hypertension   . Gastroesophageal reflux disease   . History of blood transfusion   . Hyperlipidemia   . Junctional bradycardia   . Moderate mitral regurgitation   . Peptic ulcer   . Protein calorie malnutrition ([place])   . Sinus node dysfunction (HCC)   . Type 2 diabetes mellitus (HCC)     Assessment: 85yo female c/o mid-sternal CP associated with some SOB though dissimilar to prior MI pain, troponin mildly elevated, to begin heparin.  Goal of Therapy:  Heparin level 0.3-0.7 units/ml Monitor platelets by anticoagulation protocol: Yes   Plan:  Will give heparin 2000 units IV bolus x1 followed by gtt at  900 units/hr and monitor heparin levels and CBC.  Wynona Neat, PharmD, BCPS  06/21/2019,7:08 AM

## 2019-06-21 NOTE — ED Notes (Signed)
Attempted to call report, no answer

## 2019-06-21 NOTE — Consult Note (Addendum)
The patient has been seen in conjunction with Cadence Furth, PA-C. All aspects of care have been considered and discussed. The patient has been personally interviewed, examined, and all clinical data has been reviewed.   Complex set of comorbidities including age greater than 49, end-stage kidney disease on dialysis, chronic combined systolic and diastolic heart failure, peripheral arterial disease, type 2 diabetes mellitus with complications, essential hypertension, history of sinus node dysfunction, and coronary artery disease with bypass grafting 2017 (LIMA to LAD, SVG to OM, SVG to diagonal, and SVG to distal RCA).  Bypass graft failure with restenting of the SVG to obtuse marginal July 2020 and stent implantation and SVG to diagonal also in July 2020.  Repeat cath 1 month later because of symptoms reveal chronic total occlusion of SVG to RCA, widely patent stent and SVG to ramus and to the obtuse marginal.  Native right is CTO, mid LAD CTO, ostial circumflex CTO.  Since stent implantation in July 2020 the patient has had 4 additional encounters for chest pain.  Prolonged chest pain yesterday associated with marked diffuse T wave abnormality consistent with a large region of ischemia.  Chest discomfort relieved with sublingual nitro and IV nitroglycerin.  Delta troponin is elevated.  EKG has returned to near baseline.  Suspect restenosis in the saphenous vein graft to the obtuse marginal which also supplies collaterals to the PDA.  She should undergo repeat cardiac catheterization to check the patency of the recently placed stents in the circumflex and diagonal bypass grafts.  Discussed this with the patient.  She prefers medical therapy.  We will wean nitroglycerin.  If recurrent pain, will reinitiate conversation concerning repeat cath.  If she has restenosis in the obtuse marginal graft our opportunity for long-term patency will be greatly compromised as she already has overlapping stents  in this vessel, and a diabetic who has end-stage renal disease.  In this situation, the stent is destined to fail.     Cardiology Consultation:   Patient ID: Monica [name] MRN: [id number]; DOB: 07/13/1930  Admit date: 06/21/2019 Date of Consult: 06/21/2019  Primary Care Provider: Benito Mccreedy, MD Primary Cardiologist: Ena Dawley, MD  Primary Electrophysiologist:  None    Patient Profile:   Monica [name] is a 85 y.o. female with a hx of CAD (s/p CABG 2017, NSTEMI 01/2019 with PCI), chronic combined CHF (EF 30-35% 01/2019), moderate MR, anemia, bradycardia/sinus node dysfunction, ESRD on HD, HLD and DM2 who is being seen today for the evaluation of chest pain and abnormal EKG at the request of Dr. Lorin Mercy.  History of Present Illness:   Monica [name] follows with Dr. [name] for the above cardiac issues. In early July 2020 the patient was admitted with NSTEMI. Cath showed occluded SVG to distal RCA, severe stent and in-stent restenosis of SVG to OM with successful PTCA with overlapping DES stent and successful DES to the ostial proximal SVG to the RI. Echo showed reduced EF 30-35% with moderate bilateral enlargement, moderate MR, and mild AI. She was started on ASA, Plavix and metoprolol. She was readmitted in late July for bradycardia and BB was stopped. She readmitted again in August for chest pain and elevated troponin, peak 264. Nuclear stress test was abnormal and repeat cath showed stable disease with no targets for PCI. Imdur was increased and she was started on low dose carvedilol. An event monitor showed sinus bradycardia to sinus rhythm, first degree AV block and short runs of NSVT, longest 6 beats. Patient was hospitalized  9/10 for volume overload. Hydralazine was decreased to 25 mg TID.  Patient has a h/o of chest pain during or after dialysis. She had one event within the last couple months for which the patient denied transportation to the ED. She had another visit to the ED 05/31/19  for chest pain after an extra HD session. It was originally called in as a STEMI, but it was canceled on arrival. Hs troponin peaked to 80. Patient reoported taking half the Imdur dose on HD days (60 mg ) and this was increased to a full dose on HD days. The original plan was for admission with repeat echo and work up for cardiac amyloid, but both the daughter and patient were requesting a discharge.   The patient presented to the ED at [facility] 06/21/19 for CP that began yesterday. Patient had HD yesterday and when she got home she started feeling nauseous. A little after that she experienced substernal pressure that was 10/10. It radiated down left arm. It lasted for 35 minutes. She took NTG with minimal relief. Chest pain felt different from other times (other episodes were sharp). Patient went to the ER when the pain persisted. In the ER EKG showed ST depression inferolateral leads and cardiology was paged. First troponin was 29.  It was recommended to transfer her to [facility] and continue to trend Troponin. She was started on heparin and NTG drip. Second troponin 275.   Recent labs show Potassium 5.5 Glucose 399 AST 101 ALT 57 WBC 6.6 Hgb 13.7 COVID negative  Heart Pathway Score:     Past Medical History:  Diagnosis Date  . Anemia   . Asthma   . Chronic combined systolic and diastolic CHF (congestive heart failure) ([place])    a. Echo 11/19 [facility]):  EF 55-65, normal wall motion, normal diastolic function. b. Echo 01/2019 EF 30-35%, moderate MR, mild AI.  Marland Kitchen Coronary artery disease    a. s/p CABG 2017. b.s/p NSTEMI 11/19 [facility]) >> S-PDA 100 >>PCI: 2.25 x 24 mm Synergy DES to S-OM. c. NSTEMI 01/2019 - occluded SVG-distal RCA, severe proximal to stent/ISR of SVG-OM s/p overlapping DES, DES to ostial prox SVG to the RI.   Marland Kitchen ESRD (end stage renal disease)    Dialysis Tu, Th, Sa  . Essential hypertension   . Gastroesophageal reflux disease   . History of blood transfusion   .  Hyperlipidemia   . Junctional bradycardia   . Moderate mitral regurgitation   . Peptic ulcer   . Protein calorie malnutrition ([place])   . Sinus node dysfunction (H[facility])   . Type 2 diabetes mellitus ([place])     Past Surgical History:  Procedure Laterality Date  . ABDOMINAL HYSTERECTOMY    . CARDIAC CATHETERIZATION N/A 09/17/2015   Procedure: Left Heart Cath and Coronary Angiography;  Surgeon: Jettie Booze, MD;  Location: [place] CV [facility];  Service: Cardiovascular;  Laterality: N/A;  . COLON SURGERY    . CORONARY ARTERY BYPASS GRAFT N/A 09/19/2015   Procedure: CORONARY ARTERY BYPASS GRAFTING (CABG) TIMES FOUR USING BILATARAL SAPHENOUS VEIN GRAFTS AND LEFT INTERNAL MAMMARY ARTERY;  Surgeon: [name], MD;  Location: [place];  Service: Open Heart Surgery;  Laterality: N/A;  . CORONARY STENT INTERVENTION N/A 01/10/2019   Procedure: CORONARY STENT INTERVENTION;  Surgeon: Leonie Man, MD;  Location: [place] [facility];  Service: Cardiovascular;  Laterality: N/A;  . LEFT HEART CATH AND CORS/GRAFTS ANGIOGRAPHY N/A 01/10/2019   Procedure:  LEFT HEART CATH AND CORS/GRAFTS ANGIOGRAPHY;  Surgeon: Leonie Man, MD;  Location: [place] [facility];  Service: Cardiovascular;  Laterality: N/A;  . LEFT HEART CATH AND CORS/GRAFTS ANGIOGRAPHY N/A 02/11/2019   Procedure: LEFT HEART CATH AND CORS/GRAFTS ANGIOGRAPHY;  Surgeon: Burnell Blanks, MD;  Location: [place] [facility];  Service: Cardiovascular;  Laterality: N/A;  . TEE WITHOUT CARDIOVERSION N/A 09/19/2015   Procedure: TRANSESOPHAGEAL ECHOCARDIOGRAM (TEE);  Surgeon: [name], MD;  Location: [place];  Service: Open Heart Surgery;  Laterality: N/A;     Home Medications:  Prior to Admission medications   Medication Sig Start Date End Date Taking? Authorizing Provider  acetaminophen (TYLENOL) 500 MG tablet Take 1 tablet (500 mg total) by mouth every 6 (six) hours as needed for mild pain or headache. 02/12/19   Eugenie Filler, MD    albuterol (PROVENTIL HFA;VENTOLIN HFA) 108 (90 Base) MCG/ACT inhaler Inhale 2 puffs into the lungs every 6 (six) hours as needed for wheezing or shortness of breath.    [provider]  allopurinol (ZYLOPRIM) 100 MG tablet Take 100 mg by mouth 2 (two) times daily.     [provider]  aspirin EC 81 MG EC tablet Take 1 tablet (81 mg total) by mouth daily. 10/03/15   Barrett, Erin R, PA-C  atorvastatin (LIPITOR) 80 MG tablet Take 80 mg by mouth daily at 6 PM.  07/12/18   [provider]  brimonidine (ALPHAGAN P) 0.1 % SOLN Place 1 drop into both eyes 2 (two) times daily.    [provider]  carvedilol (COREG) 12.5 MG tablet Take 1 tablet (12.5 mg total) by mouth 2 (two) times daily. 03/04/19   Dorothy Spark, MD  Cholecalciferol (VITAMIN D-3) 1000 units CAPS Take 1,000 Units by mouth daily.     [provider]  clopidogrel (PLAVIX) 75 MG tablet Take 1 tablet (75 mg total) by mouth daily. 05/25/19   Dorothy Spark, MD  diphenhydrAMINE (BENADRYL) 25 MG tablet Take 25 mg by mouth as needed for itching or allergies.     [provider]  ferric citrate (AURYXIA) 1 GM 210 MG(Fe) tablet Take 210 mg by mouth 3 (three) times daily with meals.     [provider]  hydrALAZINE (APRESOLINE) 25 MG tablet Take 1 tablet (25 mg total) by mouth 2 (two) times daily. 04/07/19   Isaiah Serge, NP  isosorbide mononitrate (IMDUR) 60 MG 24 hr tablet Take 120 mg on Monday, Wednesday, Friday and Sunday.   Take 60 mg on (Tuesday, Thursday and Saturday)  Prior  to Dialysis and 60 mg After dialysis/ Patient taking differently: Take 60-120 mg by mouth See admin instructions. Take 120 mg on Monday, Wednesday, Friday and Sunday.   Take 60 mg on (Tuesday, Thursday and Saturday)  Prior  to Dialysis and 60 mg After dialysis per Daughter 03/19/19   Regalado, Belkys A, MD  latanoprost (XALATAN) 0.005 % ophthalmic solution Place 1 drop into both eyes at bedtime.  07/05/18   [provider]  loratadine (CLARITIN) 10 MG tablet Take 10 mg by mouth as needed for allergies.     [provider]  Melatonin 5 MG CAPS Take 5 mg by mouth at bedtime.     [provider]  multivitamin (RENA-VIT) TABS tablet Take 1 tablet by mouth daily.    [provider]  nitroGLYCERIN (NITROSTAT) 0.4 MG SL tablet Place 1 tablet (0.4 mg total) under the tongue every 5 (  five) minutes as needed for chest pain. 03/18/19   Barrett, Evelene Croon, PA-C  pantoprazole (PROTONIX) 20 MG tablet Take 20 mg by mouth daily.    [provider]    Inpatient Medications: Scheduled Meds:  Continuous Infusions: . calcium gluconate 1 GM IVPB    . heparin 900 Units/hr (06/21/19 0742)  . nitroGLYCERIN 5 mcg/min (06/21/19 0807)   PRN Meds: nitroGLYCERIN, ondansetron (ZOFRAN) IV  Allergies:   No Known Allergies  Social History:   Social History   Socioeconomic History  . Marital status: Widowed    Spouse name: Not on file  . Number of children: Not on file  . Years of education: Not on file  . Highest education level: Not on file  Occupational History  . Occupation: Retired  Tobacco Use  . Smoking status: Never Smoker  . Smokeless tobacco: Former Systems developer    Types: Snuff  Substance and Sexual Activity  . Alcohol use: No    Alcohol/week: 0.0 standard drinks  . Drug use: No  . Sexual activity: Not on file  Other Topics Concern  . Not on file  Social History Narrative  . Not on file   Social Determinants of Health   Financial Resource Strain: Low Risk   . Difficulty of Paying Living Expenses: Not hard at all  Food Insecurity: No Food Insecurity  . Worried About Charity fundraiser in the Last Year: Never true  . Ran Out of Food in the Last Year: Never true  Transportation Needs: No Transportation Needs  . Lack of Transportation (Medical): No  . Lack of Transportation (Non-Medical): No  Physical Activity:   . Days of Exercise per Week:  Not on file  . Minutes of Exercise per Session: Not on file  Stress:   . Feeling of Stress : Not on file  Social Connections: Somewhat Isolated  . Frequency of Communication with Friends and Family: More than three times a week  . Frequency of Social Gatherings with Friends and Family: More than three times a week  . Attends Religious Services: More than 4 times per year  . Active Member of Clubs or Organizations: No  . Attends Archivist Meetings: Never  . Marital Status: Widowed  Intimate Partner Violence:   . Fear of Current or Ex-Partner: Not on file  . Emotionally Abused: Not on file  . Physically Abused: Not on file  . Sexually Abused: Not on file    Family History:   Family History  Problem Relation Age of Onset  . Heart attack Father      ROS:  Please see the history of present illness.  All other ROS reviewed and negative.     Physical Exam/Data:   Vitals:   06/21/19 0645 06/21/19 0715 06/21/19 0805 06/21/19 0913  BP: (!) 161/87 (!) 161/83 (!) 163/109 (!) 169/85  Pulse: 85 84 83 86  Resp: 15 14 16 18   Temp:    98.1 F (36.7 C)  TempSrc:      SpO2: 98% 99% 98% 100%  Weight:      Height:        Intake/Output Summary (Last 24 hours) at 06/21/2019 1035 Last data filed at 06/21/2019 0732 Gross per 24 hour  Intake 50 ml  Output --  Net 50 ml   Last 3 Weights 06/21/2019 05/31/2019 05/02/2019  Weight (lbs) 162 lb 0.6 oz 162 lb 163 lb  Weight (kg) 73.5 kg 73.483 kg 73.936 kg     Body  mass index is 29.64 kg/m.  General:  Frail AAF in no acute distress HEENT: normal Lymph: no adenopathy Neck: no JVD Endocrine:  No thryomegaly Vascular: No carotid bruits; FA pulses 2+ bilaterally without bruits  Cardiac:  normal S1, S2; RRR; no murmur  Lungs:  Diminished breath sounds bilaterally, no wheezing, rhonchi or rales  Abd: soft, nontender, no hepatomegaly  Ext: no edema Musculoskeletal:  No deformities, BUE and BLE strength normal and equal Skin: warm  and dry  Neuro:  CNs 2-12 intact, no focal abnormalities noted Psych:  Normal affect   EKG:  The EKG was personally reviewed and demonstrates: NSR, 90 bpm,  ST depression V5-V^, II, III, AvF. (Old) TWI aVL. STE aVR, TWI I Telemetry:  Telemetry was personally reviewed and demonstrates:  NSR, HR 80s, no other arrhythmias noted  Relevant CV Studies:  LHC 02/11/19  Dist LM to Prox LAD lesion is 30% stenosed.  Mid LAD-1 lesion is 95% stenosed.  Mid LAD-2 lesion is 100% stenosed.  Ost Cx to Mid Cx lesion is 100% stenosed.  Prox RCA lesion is 70% stenosed.  Prox RCA to Mid RCA lesion is 90% stenosed.  Mid RCA lesion is 95% stenosed.  Dist RCA lesion is 100% stenosed with 100% stenosed side branch in RPAV.  Ramus lesion is 100% stenosed.  LIMA and is normal in caliber.  There is no competitive flow  SVG.  The graft exhibits severe diffuse disease.  Origin to Prox Graft lesion is 90% stenosed.  Prox Graft to Mid Graft lesion is 80% stenosed.  Dist Graft lesion is 100% stenosed.  SVG.  SVG.  Previously placed Origin to Prox Graft stent (unknown type) is widely patent.  Previously placed Dist Graft stent (unknown type) is widely patent.  1. Chronic occlusion of the LAD is known. The LIMA to the LAD is patent 2. Chronic occlusion of the Circumflex is known. The SVG to the ramus intermediate is patent with patent proximal stent.  The SVG to the OM is patent with patent distal body stent.  3. Chronic occlusion mid RCA. The SVG to the RCA is known to be occluded (not selectively engaged today).   Recommendations: No focal targets for PCI. Continue medical management. Consider addition of Ranexa.     Laboratory Data:  High Sensitivity Troponin:   Recent Labs  Lab 05/31/19 1129 05/31/19 1436 06/21/19 0423 06/21/19 0744  TROPONINIHS 79* 80* 29* 275*     Chemistry Recent Labs  Lab 06/21/19 0450  NA 135  K 5.5*  CL 97*  CO2 22  GLUCOSE 399*  BUN 29*   CREATININE 4.39*  CALCIUM 9.1  GFRNONAA 9*  GFRAA 10*  ANIONGAP 16*    Recent Labs  Lab 06/21/19 0450  PROT 7.8  ALBUMIN 3.7  AST 101*  ALT 57*  ALKPHOS 134*  BILITOT 0.7   Hematology Recent Labs  Lab 06/21/19 0423  WBC 6.6  RBC 4.56  HGB 13.7  HCT 44.9  MCV 98.5  MCH 30.0  MCHC 30.5  RDW 18.0*  PLT 261   BNPNo results for input(s): BNP, PROBNP in the last 168 hours.  DDimer No results for input(s): DDIMER in the last 168 hours.   Radiology/Studies:  DG Chest Port 1 View  Result Date: 06/21/2019 CLINICAL DATA:  Midsternal chest pain. EXAM: PORTABLE CHEST 1 VIEW COMPARISON:  Radiograph 05/31/2019 FINDINGS: Post median sternotomy. Unchanged cardiomegaly. Unchanged mediastinal contours. Unchanged vascular congestion from prior exam. Blunting of the costophrenic angles consistent with small  effusions. No pneumothorax. No focal airspace disease. IMPRESSION: Unchanged cardiomegaly, vascular congestion, and small pleural effusions. No acute findings. Electronically Signed   By: Keith Rake M.D.   On: 06/21/2019 05:33    Assessment and Plan:   Chest pain/NSTEMI Patient presented with chest pain different from previous episodes, pressure like and minimally relieved with NTG. EKG showed ST depression/ischemic changes. Hs troponin has trended up to 275. Patient is on heparin and NTG drip.  - She has not had chest pain today - H/o of NSTEMI in July with PCI and re-look cath in august with stable disease - Patient on ASA and Plavix>> no missed doses - Continue Coreg 12.5 mg BID - Imdur 120 mg daily, on HD dialysis days 60 mg before and 60 mg after - Echo is 30-35%. ?Consider recheck echo - Will check another troponin - With EKG changes and increased troponin cardiac cath would not be unreasonable. However she does have a history of chest pain/pressure after HD. Last cath was in August and showed stable disease and conservative management was continued. Was previously  considering work-up for cardiac amyloid  Will discuss plan with MD  Chronic combined CHF - EF 30-35% earlier this year - appears to be euvolemic on exam. Does have supplemental oxygen (not on O2 at baseline) - CXR today showed unchanged cardiomegaly, vascular congestion and small pleural effusions.  - Volume management per HD  HTN - Imdur, coreg, hydralazine 25 mg BID at baseline>>restart on admission  ESRD - HD tomorrow - per IM  DM2 - SSI per IM  Elevated LFTs - hold statin  For questions or updates, please contact [facility] Please consult [email or web address] for contact info under     Signed, Cadence Ninfa Meeker, PA-C  06/21/2019 10:35 AM

## 2019-06-21 NOTE — ED Notes (Signed)
Attempted IV in right FA unsuccessful.

## 2019-06-21 NOTE — Progress Notes (Signed)
Inpatient Diabetes Program Recommendations  AACE/ADA: New Consensus Statement on Inpatient Glycemic Control (2015)  Target Ranges:  Prepandial:   less than 140 mg/dL      Peak postprandial:   less than 180 mg/dL (1-2 hours)      Critically ill patients:  140 - 180 mg/dL   Lab Results  Component Value Date   GLUCAP 108 (H) 03/19/2019   HGBA1C 7.0 (H) 01/08/2019    Review of Glycemic Control DM2 Inpatient Diabetes Program Recommendations:   -Add glycemic control order set with sensitive correction tid   Thank you, Nani Gasser. Fatih Stalvey, RN, MSN, CDE  Diabetes Coordinator Inpatient Glycemic Control Team Team Pager [phone] (8am-5pm) 06/21/2019 11:33 AM

## 2019-06-21 NOTE — H&P (Signed)
History and Physical    Monica [name] [id number] DOB: [birth year] DOA: 06/21/2019  PCP: Benito Mccreedy, MD Consultants:  [name] - cardiology; nephrology Patient coming from:  Home - lives with alone; NOK: Daughter, [place], [phone]  Chief Complaint: chest pain during HD  HPI: Monica [name] is a 85 y.o. female with medical history significant of DM; HLD; HTN; ESRD on TTS HD; CAD s/p CABG; and chronic combined CHF presenting with chest pain during HD.  She went to HD yesterday.  She went home and noticed chest pain.  Substernal pain while she "was out visiting"  (shopping?).  Substernal pain, while walking around.  +n/v (at home and currently).  +SOB.  She was started on NTG and heparin drips with resolution of CP.  She was previously seen in the ER for CP on 11/24 and discharged. She has known inoperable CAD with medical management.  ED Course:  Carryover, per Dr. Myna Hidalgo:  4 yof with ESRD, CAD, HTN, HLD, and T2DM presenting with chest pain that began during dialysis on 12/14 and then came and went multiple times. There is ST depressions on EKG, initial troponin 29, and CXR with stable cardiomegaly and vascular congestion. Labs also notable for potassium 5.5 and glucose in 300's. Cards fellow (Dr. Marletta Lor) recommended obs on our service and will put patient on consult list for this am.   Review of Systems: As per HPI; otherwise review of systems reviewed and negative.   Ambulatory Status:  Ambulates with a cane  Past Medical History:  Diagnosis Date  . Anemia   . Asthma   . Chronic combined systolic and diastolic CHF (congestive heart failure) ([place])    a. Echo 11/19 [facility]):  EF 55-65, normal wall motion, normal diastolic function. b. Echo 01/2019 EF 30-35%, moderate MR, mild AI.  Marland Kitchen Coronary artery disease    a. s/p CABG 2017. b.s/p NSTEMI 11/19 [facility]) >> S-PDA 100 >>PCI: 2.25 x 24 mm Synergy DES to S-OM. c. NSTEMI 01/2019 - occluded SVG-distal RCA, severe  proximal to stent/ISR of SVG-OM s/p overlapping DES, DES to ostial prox SVG to the RI.   Marland Kitchen ESRD (end stage renal disease)    Dialysis Tu, Th, Sa  . Essential hypertension   . Gastroesophageal reflux disease   . History of blood transfusion   . Hyperlipidemia   . Junctional bradycardia   . Moderate mitral regurgitation   . Peptic ulcer   . Protein calorie malnutrition ([place])   . Sinus node dysfunction (HCC)   . Type 2 diabetes mellitus ([place])     Past Surgical History:  Procedure Laterality Date  . ABDOMINAL HYSTERECTOMY    . CARDIAC CATHETERIZATION N/A 09/17/2015   Procedure: Left Heart Cath and Coronary Angiography;  Surgeon: Jettie Booze, MD;  Location: [place] CV LAB;  Service: Cardiovascular;  Laterality: N/A;  . COLON SURGERY    . CORONARY ARTERY BYPASS GRAFT N/A 09/19/2015   Procedure: CORONARY ARTERY BYPASS GRAFTING (CABG) TIMES FOUR USING BILATARAL SAPHENOUS VEIN GRAFTS AND LEFT INTERNAL MAMMARY ARTERY;  Surgeon: [name], MD;  Location: [place];  Service: Open Heart Surgery;  Laterality: N/A;  . CORONARY STENT INTERVENTION N/A 01/10/2019   Procedure: CORONARY STENT INTERVENTION;  Surgeon: Leonie Man, MD;  Location: [place] CV LAB;  Service: Cardiovascular;  Laterality: N/A;  . LEFT HEART CATH AND CORS/GRAFTS ANGIOGRAPHY N/A 01/10/2019   Procedure: LEFT HEART CATH AND CORS/GRAFTS ANGIOGRAPHY;  Surgeon: Leonie Man, MD;  Location: [facility]  INVASIVE CV LAB;  Service: Cardiovascular;  Laterality: N/A;  . LEFT HEART CATH AND CORS/GRAFTS ANGIOGRAPHY N/A 02/11/2019   Procedure: LEFT HEART CATH AND CORS/GRAFTS ANGIOGRAPHY;  Surgeon: Burnell Blanks, MD;  Location: [place] CV LAB;  Service: Cardiovascular;  Laterality: N/A;  . TEE WITHOUT CARDIOVERSION N/A 09/19/2015   Procedure: TRANSESOPHAGEAL ECHOCARDIOGRAM (TEE);  Surgeon: [name], MD;  Location: [place];  Service: Open Heart Surgery;  Laterality: N/A;    Social History   Socioeconomic  History  . Marital status: Widowed    Spouse name: Not on file  . Number of children: Not on file  . Years of education: Not on file  . Highest education level: Not on file  Occupational History  . Occupation: Retired  Tobacco Use  . Smoking status: Never Smoker  . Smokeless tobacco: Former Systems developer    Types: Snuff  Substance and Sexual Activity  . Alcohol use: No    Alcohol/week: 0.0 standard drinks  . Drug use: No  . Sexual activity: Not on file  Other Topics Concern  . Not on file  Social History Narrative  . Not on file   Social Determinants of Health   Financial Resource Strain: Low Risk   . Difficulty of Paying Living Expenses: Not hard at all  Food Insecurity: No Food Insecurity  . Worried About Charity fundraiser in the Last Year: Never true  . Ran Out of Food in the Last Year: Never true  Transportation Needs: No Transportation Needs  . Lack of Transportation (Medical): No  . Lack of Transportation (Non-Medical): No  Physical Activity:   . Days of Exercise per Week: Not on file  . Minutes of Exercise per Session: Not on file  Stress:   . Feeling of Stress : Not on file  Social Connections: Somewhat Isolated  . Frequency of Communication with Friends and Family: More than three times a week  . Frequency of Social Gatherings with Friends and Family: More than three times a week  . Attends Religious Services: More than 4 times per year  . Active Member of Clubs or Organizations: No  . Attends Archivist Meetings: Never  . Marital Status: Widowed  Intimate Partner Violence:   . Fear of Current or Ex-Partner: Not on file  . Emotionally Abused: Not on file  . Physically Abused: Not on file  . Sexually Abused: Not on file    No Known Allergies  Family History  Problem Relation Age of Onset  . Heart attack Father     Prior to Admission medications   Medication Sig Start Date End Date Taking? Authorizing Provider  acetaminophen (TYLENOL) 500 MG  tablet Take 1 tablet (500 mg total) by mouth every 6 (six) hours as needed for mild pain or headache. 02/12/19   Eugenie Filler, MD  albuterol (PROVENTIL HFA;VENTOLIN HFA) 108 (90 Base) MCG/ACT inhaler Inhale 2 puffs into the lungs every 6 (six) hours as needed for wheezing or shortness of breath.    [provider]  allopurinol (ZYLOPRIM) 100 MG tablet Take 100 mg by mouth 2 (two) times daily.     [provider]  aspirin EC 81 MG EC tablet Take 1 tablet (81 mg total) by mouth daily. 10/03/15   Barrett, Erin R, PA-C  atorvastatin (LIPITOR) 80 MG tablet Take 80 mg by mouth daily at 6 PM.  07/12/18   [provider]  brimonidine (ALPHAGAN P) 0.1 % SOLN Place 1 drop into  both eyes 2 (two) times daily.    [provider]  carvedilol (COREG) 12.5 MG tablet Take 1 tablet (12.5 mg total) by mouth 2 (two) times daily. 03/04/19   Dorothy Spark, MD  Cholecalciferol (VITAMIN D-3) 1000 units CAPS Take 1,000 Units by mouth daily.     [provider]  clopidogrel (PLAVIX) 75 MG tablet Take 1 tablet (75 mg total) by mouth daily. 05/25/19   Dorothy Spark, MD  diphenhydrAMINE (BENADRYL) 25 MG tablet Take 25 mg by mouth as needed for itching or allergies.     [provider]  ferric citrate (AURYXIA) 1 GM 210 MG(Fe) tablet Take 210 mg by mouth 3 (three) times daily with meals.     [provider]  hydrALAZINE (APRESOLINE) 25 MG tablet Take 1 tablet (25 mg total) by mouth 2 (two) times daily. 04/07/19   Isaiah Serge, NP  isosorbide mononitrate (IMDUR) 60 MG 24 hr tablet Take 120 mg on Monday, Wednesday, Friday and Sunday.   Take 60 mg on (Tuesday, Thursday and Saturday)  Prior  to Dialysis and 60 mg After dialysis/ Patient taking differently: Take 60-120 mg by mouth See admin instructions. Take 120 mg on Monday, Wednesday, Friday and Sunday.   Take 60 mg on (Tuesday, Thursday and Saturday)  Prior  to Dialysis and 60 mg After dialysis per  Daughter 03/19/19   Regalado, Belkys A, MD  latanoprost (XALATAN) 0.005 % ophthalmic solution Place 1 drop into both eyes at bedtime. 07/05/18   [provider]  loratadine (CLARITIN) 10 MG tablet Take 10 mg by mouth as needed for allergies.     [provider]  Melatonin 5 MG CAPS Take 5 mg by mouth at bedtime.     [provider]  multivitamin (RENA-VIT) TABS tablet Take 1 tablet by mouth daily.    [provider]  nitroGLYCERIN (NITROSTAT) 0.4 MG SL tablet Place 1 tablet (0.4 mg total) under the tongue every 5 (five) minutes as needed for chest pain. 03/18/19   Barrett, Evelene Croon, PA-C  pantoprazole (PROTONIX) 20 MG tablet Take 20 mg by mouth daily.    [provider]    Physical Exam: Vitals:   06/21/19 0645 06/21/19 0715 06/21/19 0805 06/21/19 0913  BP: (!) 161/87 (!) 161/83 (!) 163/109 (!) 169/85  Pulse: 85 84 83 86  Resp: 15 14 16 18   Temp:    98.1 F (36.7 C)  TempSrc:      SpO2: 98% 99% 98% 100%  Weight:      Height:         . General:  Appears calm and comfortable and is NAD initially, but she became acutely nauseated with vomiting while I was in the room . Eyes:   EOMI, normal lids, iris . ENT:  grossly normal hearing, lips & tongue, mmm . Neck:  no LAD, masses or thyromegaly . Cardiovascular:  RRR, no m/r/g. No LE edema.  Marland Kitchen Respiratory:   CTA bilaterally with no wheezes/rales/rhonchi.  Normal respiratory effort. . Abdomen:  soft, NT, ND, NABS . Back:   normal alignment, no CVAT . Skin:  no rash or induration seen on limited exam . Musculoskeletal:  grossly normal tone BUE/BLE, good ROM, no bony abnormality . Psychiatric:  blunted mood and affect, speech fluent and appropriate, AOx3 . Neurologic:  CN 2-12 grossly intact, moves all extremities in coordinated fashion    Radiological Exams on Admission: DG Chest Port 1 View  Result Date: 06/21/2019 CLINICAL  DATA:  Midsternal chest pain. EXAM: PORTABLE CHEST 1 VIEW  COMPARISON:  Radiograph 05/31/2019 FINDINGS: Post median sternotomy. Unchanged cardiomegaly. Unchanged mediastinal contours. Unchanged vascular congestion from prior exam. Blunting of the costophrenic angles consistent with small effusions. No pneumothorax. No focal airspace disease. IMPRESSION: Unchanged cardiomegaly, vascular congestion, and small pleural effusions. No acute findings. Electronically Signed   By: Keith Rake M.D.   On: 06/21/2019 05:33    EKG: Independently reviewed.   0321 - NSR with rate 95; incomplete LBBB; nonspecific ST changes with no evidence of acute ischemia 0459 - NSR with rate 90; incomplete LBBB; nonspecific ST changes with repolarization and concern for global ischemia   Labs on Admission: I have personally reviewed the available labs and imaging studies at the time of the admission.  Pertinent labs:   K+ 5.5 Glucose 399 BUN 29/Creatinine 4.39/GFR 10 AST 101/ALT 57 HS troponin 29 -> 275 -> 3568 -> 8971 BD test negative; Hologic COVID pending   Assessment/Plan Principal Problem:   NSTEMI (non-ST elevated myocardial infarction) ([place]) Active Problems:   Essential hypertension   Chronic combined systolic and diastolic CHF (congestive heart failure) (HCC)   Diabetes mellitus with peripheral circulatory disorder (HCC)   Hyperlipidemia associated with type 2 diabetes mellitus ([place])   ESRD on hemodialysis ([place])   NSTEMI -Patient with prior h/o CABG and known occlusive but inoperable CAD who presented to [facility] yesterday after developing CP post-HD -Her initial presentation was concerning for [place] and she was started on NTG and heparin drips and transfer was arranged for further monitoring and evaluation -The patient arrived midday today and cardiology was consulted -Repeat troponin testing with significant delta elevation, concerning for ischemia -EKG with concern for global ischemia -Cardiology has seen the patient and is also concerned; the plan is for  cardiac catheterization tomorrow if pain recurs off NTG drip -The patient prefers further medical management, if possible -Will admit since the patient has positive troponins and/or an abnormal EKG with angina necessitating acute intervention. -Continue ASA 81 mg daily -Hold Plavix for now -Imdur dose was recently increased, but this is held due to NTG drip  HTN -Continue Coreg, hydralazine  HLD -Continue Lipitor  DM, uncontrolled -She was started on Tresiba 20 units yesterday by endocrinology, although this medication has not been started yet (this note does not appear to be available in Epic) -A1c is 9.6 -She was remotely on Lantus 10 units but this was stopped during a prior hospitalization due to hypoglycemia during the hospitalization (d/c was on 8/8) -For now, she has received 20 units Novolog x 1 and will cover with moderate-scale SSI; this may need to be changed to sensitive-scale SSI if basal insulin is added  ESRD on HD -Patient on chronic TTS HD -Nephrology prn order set utilized -She does not appear to be volume overloaded or otherwise in need of acute HD -Will notify nephrology that patient will need HD tomorrow  Chronic combined CHF -Echo on 01/08/19 with EF 30-35% with pseudonormalization of diastolic function; this was new LV dysfunction with diffuse hypokinesis -Appears to be compensated at this time      Note: This patient has been tested and is POSITIVE  for the novel coronavirus COVID-19.  However, her initial POSITIVE test was on 11/24 and she should not be contagious or need precautions at this time.   DVT prophylaxis: Heparin drip Code Status:  Full - confirmed with patient/family Family Communication: Wife and son were present throughout evaluation  Disposition Plan:  Home  once clinically improved Consults called: Cardiology; nephrology  Admission status: Admit - It is my clinical opinion that admission to INPATIENT is reasonable and necessary because of  the expectation that this patient will require hospital care that crosses at least 2 midnights to treat this condition based on the medical complexity of the problems presented.  Given the aforementioned information, the predictability of an adverse outcome is felt to be significant.    Karmen Bongo MD [facility]   How to contact the [facility] Attending or Consulting provider [place] or covering provider during after hours [place], for this patient?  1. Check the care team in [facility] and look for a) attending/consulting TR[facility] provider listed and b) the [facility] team listed 2. Log into [email or web address] and use [place]'s universal password to access. If you do not have the password, please contact the hospital operator. 3. Locate the [facility] provider you are looking for under [facility] and page to a number that you can be directly reached. 4. If you still have difficulty reaching the provider, please page the [facility] (Director on Call) for the Hospitalists listed on amion for assistance.   06/21/2019, 4:41 PM

## 2019-06-21 NOTE — ED Notes (Signed)
Delay in obtaining 2nd troponin due to difficult IV access.

## 2019-06-21 NOTE — Progress Notes (Signed)
ANTICOAGULATION CONSULT NOTE - Follow Up Consult  Pharmacy Consult for heparin Indication: chest pain/ACS  No Known Allergies  Patient Measurements: Height: 5\' 2"  (157.5 cm) Weight: 162 lb 0.6 oz (73.5 kg) IBW/kg (Calculated) : 50.1 Heparin Dosing Weight: 65.9 kg  Vital Signs: Temp: 98.1 F (36.7 C) (12/15 0913) BP: 169/85 (12/15 0913) Pulse Rate: 86 (12/15 0913)  Labs: Recent Labs    06/21/19 0423 06/21/19 0450 06/21/19 0744 06/21/19 1300 06/21/19 1403 06/21/19 1633  HGB 13.7  --   --   --   --   --   HCT 44.9  --   --   --   --   --   PLT 261  --   --   --   --   --   HEPARINUNFRC  --   --   --   --   --  0.23*  CREATININE  --  4.39*  --   --   --   --   TROPONINIHS 29*  --  275* 3,568* 8,971*  --     Estimated Creatinine Clearance: 8.6 mL/min (A) (by C-G formula based on SCr of 4.39 mg/dL (H)).   Assessment: 85 yr old female with ESRD on HD and hx of NSTEMI with PCI in July 2020 presented with mid-sternal CP after HD associated with SOB, troponin mildly elevated, started on heparin earlier today (2000 units IV bolus X 1, followed by heparin infusion at 900 units/hr).   Heparin level ~8.5 hrs after bolus and initiation of infusion was 0.23 units/ml, which is below the goal range for this patient. Per RN, pt's heparin IV was in the bend of her arm, which has caused some issues with infusion today; IV has been moved to alternative site. RN reports no issues with bleeding.  H/H, platelets WNL  Goal of Therapy:  Heparin level 0.3-0.7 units/ml Monitor platelets by anticoagulation protocol: Yes   Plan:  Increase heparin infusion to 1050 units/hr Check 8-hr heparin level Monitor daily heparin level, CBC Monitor for signs/symptoms of bleedin  Gillermina Hu, PharmD, BCPS, [facility] Clinical Pharmacist  06/21/2019,5:47 PM

## 2019-06-21 NOTE — ED Notes (Signed)
Attempted to call report to floor, no answer

## 2019-06-22 ENCOUNTER — Other Ambulatory Visit: Payer: Self-pay

## 2019-06-22 DIAGNOSIS — E1169 Type 2 diabetes mellitus with other specified complication: Secondary | ICD-10-CM

## 2019-06-22 DIAGNOSIS — E1151 Type 2 diabetes mellitus with diabetic peripheral angiopathy without gangrene: Secondary | ICD-10-CM

## 2019-06-22 DIAGNOSIS — I5042 Chronic combined systolic (congestive) and diastolic (congestive) heart failure: Secondary | ICD-10-CM

## 2019-06-22 DIAGNOSIS — I10 Essential (primary) hypertension: Secondary | ICD-10-CM

## 2019-06-22 DIAGNOSIS — E785 Hyperlipidemia, unspecified: Secondary | ICD-10-CM

## 2019-06-22 LAB — RENAL FUNCTION PANEL
Albumin: 3.3 g/dL — ABNORMAL LOW (ref 3.5–5.0)
Anion gap: 14 (ref 5–15)
BUN: 38 mg/dL — ABNORMAL HIGH (ref 8–23)
CO2: 24 mmol/L (ref 22–32)
Calcium: 8.8 mg/dL — ABNORMAL LOW (ref 8.9–10.3)
Chloride: 98 mmol/L (ref 98–111)
Creatinine, Ser: 6.2 mg/dL — ABNORMAL HIGH (ref 0.44–1.00)
GFR calc Af Amer: 7 mL/min — ABNORMAL LOW (ref >60)
GFR calc non Af Amer: 6 mL/min — ABNORMAL LOW (ref >60)
Glucose, Bld: 210 mg/dL — ABNORMAL HIGH (ref 70–99)
Phosphorus: 5 mg/dL — ABNORMAL HIGH (ref 2.5–4.6)
Potassium: 4.8 mmol/L (ref 3.5–5.1)
Sodium: 136 mmol/L (ref 135–145)

## 2019-06-22 LAB — CBC
HCT: 39.4 % (ref 36.0–46.0)
Hemoglobin: 12.3 g/dL (ref 12.0–15.0)
MCH: 30.3 pg (ref 26.0–34.0)
MCHC: 31.2 g/dL (ref 30.0–36.0)
MCV: 97 fL (ref 80.0–100.0)
Platelets: 246 10*3/uL (ref 150–400)
RBC: 4.06 MIL/uL (ref 3.87–5.11)
RDW: 17.4 % — ABNORMAL HIGH (ref 11.5–15.5)
WBC: 8.5 10*3/uL (ref 4.0–10.5)
nRBC: 0 % (ref 0.0–0.2)

## 2019-06-22 LAB — LIPID PANEL
Cholesterol: 114 mg/dL (ref 0–200)
HDL: 61 mg/dL (ref >40)
LDL Cholesterol: 43 mg/dL (ref 0–99)
Total CHOL/HDL Ratio: 1.9 RATIO
Triglycerides: 51 mg/dL (ref <150)
VLDL: 10 mg/dL (ref 0–40)

## 2019-06-22 SURGERY — LEFT HEART CATH AND CORS/GRAFTS ANGIOGRAPHY
Anesthesia: LOCAL

## 2019-06-22 MED ORDER — CLOPIDOGREL BISULFATE 75 MG PO TABS: 75.0000 mg | ORAL_TABLET | Freq: Every day | ORAL | Status: DC

## 2019-06-22 MED ORDER — NEPRO/CARBSTEADY PO LIQD: 237.0000 mL | Freq: Three times a day (TID) | ORAL | 0 refills | Status: DC | PRN

## 2019-06-22 MED ORDER — CALCIUM CARBONATE ANTACID 1250 MG/5ML PO SUSP: 500.0000 mg | Freq: Four times a day (QID) | ORAL | 0 refills | Status: DC | PRN

## 2019-06-22 MED ORDER — CAMPHOR-MENTHOL 0.5-0.5 % EX LOTN: 1.0000 "application " | TOPICAL_LOTION | Freq: Three times a day (TID) | CUTANEOUS | 0 refills | Status: DC | PRN

## 2019-06-22 MED ORDER — SORBITOL 70 % SOLN: 30.0000 mL | 0 refills | Status: DC | PRN

## 2019-06-22 MED ORDER — RENA-VITE PO TABS: 1.0000 | ORAL_TABLET | Freq: Every day | ORAL | Status: DC

## 2019-06-22 NOTE — Consult Note (Signed)
   [facility] CM Inpatient Consult   06/22/2019  Monica [name] 03-09-1931 [id number]   [facility] Active status:   Patient has been currently active with [facility] Management services.  Patient has been [facility] coordinator, (who is aware of this admission) prior to readmission, as per primary care provider's referral.  This patient will receive a post hospital call and will be evaluated for assessments and disease process educationif transitioning to home.  Patient wasreviewed for 45% extreme high risk score for unplanned readmission, with 5 hospitalizations and 1 ED visit in the past 6 months, as a benefit from her [facility] plan.  Chart reviewed andMD history and physical dated 06/21/19 reveals as: Monica [name] is a 85 y.o. female with medical history significant of DM; HLD; HTN; ESRD (dialyzes at [facility] per chart; pt reports MWF HD); CAD s/p CABG; and chronic combined CHF,    presented with chest pain during HD. Ms.Patteson follows with Dr. [name] for cardiac issues.  Per Nephrology note 12/15, patient has been seen by cardiology, medical therapy being pursued for now- NSTEMI: on heparin and nitro gtts.    Primary care provider is Dr.George Osei-Bonsu with [facility]. . Plan:Will follow for progress and dispositionwithinpatient TOC team.    [facility] disposition and needs.  Of note, [facility] Care Management services does not replace or interfere with any services that are arranged by transition of care case management or social work.    For additional questions or referrals,please contact:  Donat Humble A. Carvel Huskins, BSN, RN-BC [facility] Liaison Cell: [phone]

## 2019-06-22 NOTE — Progress Notes (Signed)
[facility] Progress Note   Dialysis Orders: MWF [facility] - awaiting orders from outside unit - thinks her EDW is 184#  Assessment/Plan: 1. NTSEMI - heparin and nitro drip/med tx per cards Trop up to 8971 2. ESRD - MWF K 5.5 on 2 K bath waiting orders from home unit - given IV Ca last night  3. Anemia - hgb 13.7 - no ESA 4. Secondary hyperparathyroidism - binders when eating 5. HTN/volume - CXR some vasc congestion/small effusions - goal today net 2 L - awaiting info on EDW - titrate further sats good 6. Nutrition -renal diet when eating NPO at present 7. ^ LFT  Myriam Jacobson, PA-C [facility] Beeper [phone] 06/22/2019,8:16 AM  LOS: 1 day   Subjective:   Chest pain resolved  Objective Vitals:   06/22/19 0711 06/22/19 0719 06/22/19 0730 06/22/19 0800  BP: 126/68 136/66 139/72 122/64  Pulse: 78 77 79 77  Resp: 13 13 12 12   Temp: 98 F (36.7 C)     TempSrc: Oral     SpO2: 100%     Weight: 71.9 kg     Height:       Physical Exam General: resting on dialysis NAD supine on O2 breathing easily Heart: RRR Lungs: crackles right base Abdomen: soft NT Extremities: no LE edema Dialysis Access: left upper AVF    Additional Objective Labs: Basic Metabolic Panel: Recent Labs  Lab 06/21/19 0450  NA 135  K 5.5*  CL 97*  CO2 22  GLUCOSE 399*  BUN 29*  CREATININE 4.39*  CALCIUM 9.1   Liver Function Tests: Recent Labs  Lab 06/21/19 0450  AST 101*  ALT 57*  ALKPHOS 134*  BILITOT 0.7  PROT 7.8  ALBUMIN 3.7   No results for input(s): LIPASE, AMYLASE in the last 168 hours. CBC: Recent Labs  Lab 06/21/19 0423  WBC 6.6  NEUTROABS 4.9  HGB 13.7  HCT 44.9  MCV 98.5  PLT 261   Blood Culture    Component Value Date/Time   SDES URINE, RANDOM 01/18/2019 0748   SPECREQUEST NONE 01/18/2019 0748   CULT (A) 01/18/2019 0748    <10,000 COLONIES/mL INSIGNIFICANT GROWTH Performed at [facility] [address], [place],  [place] [address]    REPTSTATUS 01/19/2019 FINAL 01/18/2019 0748    Cardiac Enzymes: No results for input(s): CKTOTAL, CKMB, CKMBINDEX, TROPONINI in the last 168 hours. CBG: Recent Labs  Lab 06/21/19 1336 06/21/19 1645 06/21/19 2123  GLUCAP 419* 298* 137*   Iron Studies: No results for input(s): IRON, TIBC, TRANSFERRIN, FERRITIN in the last 72 hours. Lab Results  Component Value Date   INR 1.53 (H) 09/19/2015   INR 1.01 09/17/2015   INR 1.07 09/15/2015   Studies/Results: DG Chest Port 1 View  Result Date: 06/21/2019 CLINICAL DATA:  Midsternal chest pain. EXAM: PORTABLE CHEST 1 VIEW COMPARISON:  Radiograph 05/31/2019 FINDINGS: Post median sternotomy. Unchanged cardiomegaly. Unchanged mediastinal contours. Unchanged vascular congestion from prior exam. Blunting of the costophrenic angles consistent with small effusions. No pneumothorax. No focal airspace disease. IMPRESSION: Unchanged cardiomegaly, vascular congestion, and small pleural effusions. No acute findings. Electronically Signed   By: Keith Rake M.D.   On: 06/21/2019 05:33   Medications: . sodium chloride    . sodium chloride 10 mL/hr at 06/22/19 0458  . sodium chloride    . sodium chloride    . calcium gluconate 1 GM IVPB    . heparin 1,050 Units/hr (06/22/19 0458)  .  nitroGLYCERIN 40 mcg/min (06/22/19 0458)   . allopurinol  100 mg Oral BID  . aspirin EC  81 mg Oral Daily  . atorvastatin  80 mg Oral q1800  . brimonidine  1 drop Both Eyes BID  . carvedilol  12.5 mg Oral BID  . ferric citrate  210 mg Oral TID WC  . hydrALAZINE  25 mg Oral BID  . insulin aspart  0-15 Units Subcutaneous TID WC  . insulin aspart  0-5 Units Subcutaneous QHS  . latanoprost  1 drop Both Eyes QHS  . mouth rinse  15 mL Mouth Rinse BID  . Melatonin  6 mg Oral QHS  . pantoprazole  20 mg Oral Daily  . sodium chloride flush  3 mL Intravenous Q12H

## 2019-06-22 NOTE — Patient Outreach (Signed)
[facility]) Care Management  06/22/2019  Monica [name] 08-08-1930 [id number]   Telephone Assessment    Upon chart review noted that patient admitted to the hospital on 06/21/2019 for NSTEMI.     Plan: RN CM will continue to follow for discharge disposition.    Enzo [place], RN,BSN,CCM [place] Management Telephonic Care Management Coordinator Direct Phone: [phone] Toll Free: [phone] Fax: [phone]

## 2019-06-22 NOTE — Progress Notes (Signed)
Reviewed consult for PIV. Pt currently Receiving dialysis.

## 2019-06-22 NOTE — Discharge Summary (Signed)
Physician Discharge Summary  Monica [name] YJE:[id number] DOB: April 02, 1931 DOA: 06/21/2019  PCP: Benito Mccreedy, MD  Admit date: 06/21/2019 Discharge date: 06/22/2019  Recommendations for Outpatient Follow-up:  Follow up with PCP in 7-10 days. Follow up with Cardiology in   Discharge Diagnoses: Principal diagnosis is #1 1. NSTEMI 2. CAD 3. Hypertension 4. Hyperlipidemia 5. DM II uncontrolled. 6. ESRD on HD.  Discharge Condition: Fair  Disposition: Home with family  Diet recommendation: Heart healthy  Filed Weights   06/21/19 0317 06/22/19 0711  Weight: 73.5 kg 71.9 kg   History of present illness:  Monica [name] is a 85 y.o. female with medical history significant of DM; HLD; HTN; ESRD on TTS HD; CAD s/p CABG; and chronic combined CHF presenting with chest pain during HD.  She went to HD yesterday.  She went home and noticed chest pain.  Substernal pain while she "was out visiting"  (shopping?).  Substernal pain, while walking around.  +n/v (at home and currently).  +SOB.  She was started on NTG and heparin drips with resolution of CP.  She was previously seen in the ER for CP on 11/24 and discharged. She has known inoperable CAD with medical management.  ED Course:  Carryover, per Dr. Myna Hidalgo:  65 yof with ESRD, CAD, HTN, HLD, and T2DM presenting with chest pain that began during dialysis on 12/14 and then came and went multiple times. There is ST depressions on EKG, initial troponin 29, and CXR with stable cardiomegaly and vascular congestion. Labs also notable for potassium 5.5 and glucose in 300's. Cards fellow (Dr. Marletta Lor) recommended obs on our service and will put patient on consult list for this am.   Hospital Course:  The patient was admitted to a telemetry bed. Cardiology was consulted and the patient was evaluated by Dr. Daneen Schick. He offered the patient risk stratification by [facility]. The patient declined. Dr. Tamala Julian told me that the patient was appropriate for discharge  to home on her home medication. I later received a call from nursing that the daughter did not feel that her mother was competent to make this determination. I recommended that cardiology come back to talk to the daughter. Meanwhile the patient is refusing interventions such as taking her regular medications or having her glucose checked. It seems now that the daughter has changed her mind. She will stay with the patient in her home here in [place]. The daughter who is NOK lives in [place].   Today's assessment: S: The patient seems somewhat confused. She is a little agitated and is moving about in bed. There is no new complaints. O: Vitals:  Vitals:   06/22/19 1000 06/22/19 1018  BP: (!) 145/71 131/63  Pulse: 86 85  Resp: 12 12  Temp:  98.5 F (36.9 C)  SpO2:  100%    Exam:  Constitutional:  . The patient is awake and alert. She does not seem oriented. No acute distress. Respiratory:  . No increased work of breathing. . No wheezes, rales, or rhonchi . No tactile fremitus Cardiovascular:  . Regular rate and rhythm . No murmurs, ectopy, or gallups. . No lateral PMI. No thrills. Abdomen:  . Abdomen is soft, non-tender, non-distended . No hernias, masses, or organomegaly . Normoactive bowel sounds.  Musculoskeletal:  . No cyanosis, clubbing, or edema Skin:  . No rashes, lesions, ulcers . palpation of skin: no induration or nodules Neurologic:  . CN 2-12 intact . Sensation all 4 extremities intact Psychiatric:  . Mental status  o Mood, affect appropriate o Orientation to person, place, time  . judgment and insight appear intact  Discharge Instructions    Allergies as of 06/22/2019   No Known Allergies     Medication List    STOP taking these medications   diphenhydrAMINE 25 MG tablet Commonly known as: BENADRYL     TAKE these medications   acetaminophen 500 MG tablet Commonly known as: TYLENOL Take 1 tablet (500 mg total) by mouth every 6 (six) hours  as needed for mild pain or headache.   albuterol 108 (90 Base) MCG/ACT inhaler Commonly known as: VENTOLIN HFA Inhale 2 puffs into the lungs every 6 (six) hours as needed for wheezing or shortness of breath.   allopurinol 100 MG tablet Commonly known as: ZYLOPRIM Take 100 mg by mouth 2 (two) times daily.   Alphagan P 0.1 % Soln Generic drug: brimonidine Place 1 drop into both eyes 2 (two) times daily.   aspirin 81 MG EC tablet Take 1 tablet (81 mg total) by mouth daily.   atorvastatin 80 MG tablet Commonly known as: LIPITOR Take 80 mg by mouth daily at 6 PM.   calcium carbonate (dosed in mg elemental calcium) 1250 MG/5ML Susp Take 5 mLs (500 mg of elemental calcium total) by mouth every 6 (six) hours as needed for indigestion.   camphor-menthol lotion Commonly known as: SARNA Apply 1 application topically every 8 (eight) hours as needed for itching.   carvedilol 12.5 MG tablet Commonly known as: COREG Take 1 tablet (12.5 mg total) by mouth 2 (two) times daily.   clopidogrel 75 MG tablet Commonly known as: PLAVIX Take 1 tablet (75 mg total) by mouth daily.   feeding supplement (NEPRO CARB STEADY) Liqd Take 237 mLs by mouth 3 (three) times daily as needed (Supplement).   ferric citrate 1 GM 210 MG(Fe) tablet Commonly known as: AURYXIA Take 210 mg by mouth 3 (three) times daily with meals.   hydrALAZINE 25 MG tablet Commonly known as: APRESOLINE Take 1 tablet (25 mg total) by mouth 2 (two) times daily.   isosorbide mononitrate 60 MG 24 hr tablet Commonly known as: IMDUR Take 120 mg on Monday, Wednesday, Friday and Sunday.   Take 60 mg on (Tuesday, Thursday and Saturday)  Prior  to Dialysis and 60 mg After dialysis/ What changed:   how much to take  how to take this  when to take this  additional instructions   latanoprost 0.005 % ophthalmic solution Commonly known as: XALATAN Place 1 drop into both eyes at bedtime.   loratadine 10 MG tablet Commonly  known as: CLARITIN Take 10 mg by mouth as needed for allergies.   Melatonin 5 MG Caps Take 5 mg by mouth at bedtime.   multivitamin Tabs tablet Take 1 tablet by mouth daily.   nitroGLYCERIN 0.4 MG SL tablet Commonly known as: NITROSTAT Place 1 tablet (0.4 mg total) under the tongue every 5 (five) minutes as needed for chest pain.   pantoprazole 20 MG tablet Commonly known as: PROTONIX Take 20 mg by mouth daily.   sorbitol 70 % Soln Take 30 mLs by mouth as needed for moderate constipation.   Vitamin D-3 25 MCG (1000 UT) Caps Take 1,000 Units by mouth daily.       No Known Allergies  The results of significant diagnostics from this hospitalization (including imaging, microbiology, ancillary and laboratory) are listed below for reference.    Significant Diagnostic Studies: DG Chest Port 1 View  Result Date:  06/21/2019 CLINICAL DATA:  Midsternal chest pain. EXAM: PORTABLE CHEST 1 VIEW COMPARISON:  Radiograph 05/31/2019 FINDINGS: Post median sternotomy. Unchanged cardiomegaly. Unchanged mediastinal contours. Unchanged vascular congestion from prior exam. Blunting of the costophrenic angles consistent with small effusions. No pneumothorax. No focal airspace disease. IMPRESSION: Unchanged cardiomegaly, vascular congestion, and small pleural effusions. No acute findings. Electronically Signed   By: Keith Rake M.D.   On: 06/21/2019 05:33   DG Chest Port 1 View  Result Date: 05/31/2019 CLINICAL DATA:  Chest pain EXAM: PORTABLE CHEST 1 VIEW COMPARISON:  March 17 2019 FINDINGS: Pulmonary vascular congestion. Possible trace right pleural effusion. No pneumothorax. Cardiomegaly. IMPRESSION: Pulmonary vascular congestion. Possible trace right pleural effusion. Cardiomegaly. Electronically Signed   By: Macy Mis M.D.   On: 05/31/2019 10:24    Microbiology: Recent Results (from the past 240 hour(s))  SARS Coronavirus 2 Ag (30 min TAT) - Nasal Swab (BD Veritor Kit)      Status: None   Collection Time: 06/21/19  5:36 AM   Specimen: Nasal Swab (BD Veritor Kit)  Result Value Ref Range Status   SARS Coronavirus 2 Ag NEGATIVE NEGATIVE Final    Comment: (NOTE) SARS-CoV-2 antigen NOT DETECTED.  Negative results are presumptive.  Negative results do not preclude SARS-CoV-2 infection and should not be used as the sole basis for treatment or other patient management decisions, including infection  control decisions, particularly in the presence of clinical signs and  symptoms consistent with COVID-19, or in those who have been in contact with the virus.  Negative results must be combined with clinical observations, patient history, and epidemiological information. The expected result is Negative. Fact Sheet for Patients: PodPark.tn Fact Sheet for Healthcare Providers: GiftContent.is This test is not yet approved or cleared by the [place] FDA and  has been authorized for detection and/or diagnosis of SARS-CoV-2 by FDA under an Emergency Use Authorization (EUA).  This EUA will remain in effect (meaning this test can be used) for the duration of  the COVID-19 de claration under Section 564(b)(1) of the Act, 21 U.S.C. section 360bbb-3(b)(1), unless the authorization is terminated or revoked sooner. Performed at [facility], [address] [place], [place] [address]   SARS CORONAVIRUS 2 (TAT 6-24 HRS)     Status: Abnormal   Collection Time: 06/21/19  8:00 AM  Result Value Ref Range Status   SARS Coronavirus 2 POSITIVE (A) NEGATIVE Final    Comment: Email sent to L Berdik,RN [phone] 06/21/2019 D Bradley (NOTE) SARS-CoV-2 target nucleic acids are DETECTED. The SARS-CoV-2 RNA is generally detectable in upper and lower respiratory specimens during the acute phase of infection. Positive results are indicative of the presence of SARS-CoV-2 RNA. Clinical correlation with patient history and  other diagnostic information is  necessary to determine patient infection status. Positive results do not rule out bacterial infection or co-infection with other viruses.  The expected result is Negative. Fact Sheet for Patients: SugarRoll.be Fact Sheet for Healthcare Providers: https://www.woods-mathews.com/ This test is not yet approved or cleared by the [place] FDA and  has been authorized for detection and/or diagnosis of SARS-CoV-2 by FDA under an Emergency Use Authorization (EUA). This EUA will remain  in effect (meaning this test can be used) for the duration of the COVID-19 declarat ion under Section 564(b)(1) of the Act, 21 U.S.C. section 360bbb-3(b)(1), unless the authorization is terminated or revoked sooner. Performed at [facility], [place] [address], [place], [place] [address]   MRSA  PCR Screening     Status: None   Collection Time: 06/21/19  8:34 PM   Specimen: Nasopharyngeal  Result Value Ref Range Status   MRSA by PCR NEGATIVE NEGATIVE Final    Comment:        The GeneXpert MRSA Assay (FDA approved for NASAL specimens only), is one component of a comprehensive MRSA colonization surveillance program. It is not intended to diagnose MRSA infection nor to guide or monitor treatment for MRSA infections. Performed at [facility], [place] [place] [address] [place], [place] [address]      Labs: Basic Metabolic Panel: Recent Labs  Lab 06/21/19 0450 06/22/19 0819  NA 135 136  K 5.5* 4.8  CL 97* 98  CO2 22 24  GLUCOSE 399* 210*  BUN 29* 38*  CREATININE 4.39* 6.20*  CALCIUM 9.1 8.8*  PHOS  --  5.0*   Liver Function Tests: Recent Labs  Lab 06/21/19 0450 06/22/19 0819  AST 101*  --   ALT 57*  --   ALKPHOS 134*  --   BILITOT 0.7  --   PROT 7.8  --   ALBUMIN 3.7 3.3*   No results for input(s): LIPASE, AMYLASE in the last 168 hours. No results for input(s): AMMONIA in the last 168 hours. CBC: Recent  Labs  Lab 06/21/19 0423 06/22/19 0819  WBC 6.6 8.5  NEUTROABS 4.9  --   HGB 13.7 12.3  HCT 44.9 39.4  MCV 98.5 97.0  PLT 261 246   Cardiac Enzymes: No results for input(s): CKTOTAL, CKMB, CKMBINDEX, TROPONINI in the last 168 hours. BNP: BNP (last 3 results) Recent Labs    01/17/19 2133 03/17/19 1630 05/31/19 1436  BNP 1,643.2* 2,492.2* 3,301.8*    ProBNP (last 3 results) No results for input(s): PROBNP in the last 8760 hours.  CBG: Recent Labs  Lab 06/21/19 1336 06/21/19 1645 06/21/19 2123  GLUCAP 419* 298* 137*    Principal Problem:   NSTEMI (non-ST elevated myocardial infarction) ([place]) Active Problems:   Essential hypertension   Chronic combined systolic and diastolic CHF (congestive heart failure) (H[facility])   Diabetes mellitus with peripheral circulatory disorder ([place])   Hyperlipidemia associated with type 2 diabetes mellitus ([place])   ESRD on hemodialysis ([place])   Time coordinating discharge: 38 minutes.  Signed:        Samiya Mervin, DO [facility]  06/22/2019, 2:34 PM

## 2019-06-22 NOTE — Evaluation (Signed)
Physical Therapy Evaluation Patient Details Name: Monica [name] MRN: [id number] DOB: 06/17/1931 Today's Date: 06/22/2019   History of Present Illness  Monica [name] is a 85 y.o. female with medical history significant of DM; HLD; HTN; ESRD on TTS HD; CAD s/p CABG; and chronic combined CHF presenting with chest pain during HD.  She went to HD yesterday.  She went home and noticed chest pain.  Positive for NSTEMI, but refusing heart cath.  Clinical Impression  Patient presents with mobility close to her functional baseline.  Daughter stepped out during mobility eval so unsure if her cognition is at baseline.  She remembered me from seeing her years ago, but did not remember she has a life alert at home.  Son's normally assist with IADL's and transport to dialysis.  Seems her daughter may stay with her initially.  She refused follow up HHPT/aide at this time.  Will sign off as planned d/c and pt stable for home with assist.     Follow Up Recommendations No PT follow up    Equipment Recommendations  None recommended by PT    Recommendations for Other Services       Precautions / Restrictions Precautions Precautions: Fall Precaution Comments: denies falls at home Restrictions Weight Bearing Restrictions: No      Mobility  Bed Mobility               General bed mobility comments: up in recliner  Transfers Overall transfer level: Modified independent Equipment used: Straight cane             General transfer comment: rising from recliner without assist  Ambulation/Gait Ambulation/Gait assistance: Supervision Gait Distance (Feet): 170 Feet Assistive device: Straight cane Gait Pattern/deviations: Step-to pattern;Step-through pattern;Decreased stride length     General Gait Details: stopping in hallway not due to fatigue, but due to curiosity per her report  Stairs            Wheelchair Mobility    Modified Rankin (Stroke Patients Only)       Balance Overall  balance assessment: Needs assistance   Sitting balance-Leahy Scale: Good     Standing balance support: No upper extremity supported Standing balance-Leahy Scale: Fair Standing balance comment: initial standing without cane no LOB                             Pertinent Vitals/Pain Pain Assessment: No/denies pain    Home Living Family/patient expects to be discharged to:: Private residence   Available Help at Discharge: Family;Available PRN/intermittently Type of Home: House Home Access: Stairs to enter   Entrance Stairs-Number of Steps: 3 Home Layout: One level Home Equipment: Walker - 2 wheels;Cane - single point;Shower seat;Grab bars - tub/shower Additional Comments: uses cane at home, sons assist with groceries and taking to dialysis, daughter here from [place] and may stay with pt initially    Prior Function Level of Independence: Independent with assistive device(s)               Hand Dominance        Extremity/Trunk Assessment   Upper Extremity Assessment Upper Extremity Assessment: Generalized weakness(limited shoulder elevation due to weakness)    Lower Extremity Assessment Lower Extremity Assessment: Overall WFL for tasks assessed       Communication      Cognition Arousal/Alertness: Awake/alert Behavior During Therapy: WFL for tasks assessed/performed Overall Cognitive Status: Impaired/Different from baseline Area of Impairment: Memory;Orientation  Orientation Level: Disoriented to;Situation   Memory: Decreased short-term memory         General Comments: some confusion/agitation during stay; but remembered me seeing her when she was here before      General Comments General comments (skin integrity, edema, etc.): ambulating on RA noted SpO2 mostly in 90's twice dropped to 85% but not correlating with pleth and telemetry, back to 90's quickly    Exercises     Assessment/Plan    PT Assessment Patent  does not need any further PT services  PT Problem List         PT Treatment Interventions      PT Goals (Current goals can be found in the Care Plan section)  Acute Rehab PT Goals PT Goal Formulation: All assessment and education complete, DC therapy    Frequency     Barriers to discharge        Co-evaluation               AM-PAC PT "6 Clicks" Mobility  Outcome Measure Help needed turning from your back to your side while in a flat bed without using bedrails?: None Help needed moving from lying on your back to sitting on the side of a flat bed without using bedrails?: None Help needed moving to and from a bed to a chair (including a wheelchair)?: None Help needed standing up from a chair using your arms (e.g., wheelchair or bedside chair)?: None Help needed to walk in hospital room?: None Help needed climbing 3-5 steps with a railing? : A Little 6 Click Score: 23    End of Session   Activity Tolerance: Patient tolerated treatment well Patient left: in chair;with call bell/phone within reach;with family/visitor present   PT Visit Diagnosis: Muscle weakness (generalized) (M62.81)    Time: [id number] PT Time Calculation (min) (ACUTE ONLY): 25 min   Charges:   PT Evaluation $PT Eval Low Complexity: 1 Low PT Treatments $Gait Training: 8-22 mins        Monica [name], [place] [phone] 06/22/2019   Monica [name] 06/22/2019, 3:31 PM

## 2019-06-22 NOTE — Progress Notes (Signed)
Progress Note  Patient Name: Monica [name] Date of Encounter: 06/22/2019  Primary Cardiologist: Ena Dawley, MD   Subjective   She is aggravated this morning.  Completed dialysis earlier.  She denies chest discomfort and dyspnea.  She has completed dialysis.  Inpatient Medications    Scheduled Meds: . allopurinol  100 mg Oral BID  . aspirin EC  81 mg Oral Daily  . atorvastatin  80 mg Oral q1800  . brimonidine  1 drop Both Eyes BID  . carvedilol  12.5 mg Oral BID  . clopidogrel  75 mg Oral Daily  . ferric citrate  210 mg Oral TID WC  . hydrALAZINE  25 mg Oral BID  . insulin aspart  0-15 Units Subcutaneous TID WC  . insulin aspart  0-5 Units Subcutaneous QHS  . latanoprost  1 drop Both Eyes QHS  . mouth rinse  15 mL Mouth Rinse BID  . Melatonin  6 mg Oral QHS  . multivitamin  1 tablet Oral QHS  . pantoprazole  20 mg Oral Daily  . sodium chloride flush  3 mL Intravenous Q12H   Continuous Infusions: . sodium chloride    . sodium chloride Stopped (06/22/19 0500)  . sodium chloride    . sodium chloride    . calcium gluconate 1 GM IVPB    . heparin Stopped (06/22/19 0500)  . nitroGLYCERIN Stopped (06/22/19 0500)   PRN Meds: sodium chloride, sodium chloride, sodium chloride, acetaminophen **OR** acetaminophen, albuterol, alteplase, calcium carbonate (dosed in mg elemental calcium), camphor-menthol **AND** [DISCONTINUED] hydrOXYzine, docusate sodium, feeding supplement (NEPRO CARB STEADY), heparin, lidocaine (PF), lidocaine-prilocaine, pentafluoroprop-tetrafluoroeth, sodium chloride flush, sorbitol, zolpidem   Vital Signs    Vitals:   06/22/19 0900 06/22/19 0930 06/22/19 1000 06/22/19 1018  BP: (!) 146/75 (!) 153/78 (!) 145/71 131/63  Pulse: 80 88 86 85  Resp: 13 12 12 12   Temp:    98.5 F (36.9 C)  TempSrc:    Oral  SpO2:    100%  Weight:      Height:        Intake/Output Summary (Last 24 hours) at 06/22/2019 1309 Last data filed at 06/22/2019 1018 Gross per  24 hour  Intake 567.97 ml  Output 1525 ml  Net -957.03 ml   Last 3 Weights 06/22/2019 06/21/2019 05/31/2019  Weight (lbs) 158 lb 8.2 oz 162 lb 0.6 oz 162 lb  Weight (kg) 71.9 kg 73.5 kg 73.483 kg      Telemetry    Normal sinus rhythm with ST abnormality- Personally Reviewed  ECG    No new tracing- Personally Reviewed  Physical Exam  Appears younger than stated age GEN: No acute distress.   Neck: No JVD Cardiac: RRR, no murmurs, rubs, or gallops.  Respiratory: Clear to auscultation bilaterally. GI: Soft, nontender, non-distended  MS: No edema; No deformity. Neuro:   Restless.  She does not appear to totally understand the process that we have outlined for management.  The daughter understands.  The daughter also feels it is reasonable to let her go home and have further conversation with family. Psych: The daughter states that she is very anxious and just wants to go home.  The patient reaffirms this to me.  Labs    High Sensitivity Troponin:   Recent Labs  Lab 05/31/19 1436 06/21/19 0423 06/21/19 0744 06/21/19 1300 06/21/19 1403  TROPONINIHS 80* 29* 275* 3,568* 8,971*      Chemistry Recent Labs  Lab 06/21/19 0450 06/22/19 0819  NA 135  136  K 5.5* 4.8  CL 97* 98  CO2 22 24  GLUCOSE 399* 210*  BUN 29* 38*  CREATININE 4.39* 6.20*  CALCIUM 9.1 8.8*  PROT 7.8  --   ALBUMIN 3.7 3.3*  AST 101*  --   ALT 57*  --   ALKPHOS 134*  --   BILITOT 0.7  --   GFRNONAA 9* 6*  GFRAA 10* 7*  ANIONGAP 16* 14     Hematology Recent Labs  Lab 06/21/19 0423 06/22/19 0819  WBC 6.6 8.5  RBC 4.56 4.06  HGB 13.7 12.3  HCT 44.9 39.4  MCV 98.5 97.0  MCH 30.0 30.3  MCHC 30.5 31.2  RDW 18.0* 17.4*  PLT 261 246    BNPNo results for input(s): BNP, PROBNP in the last 168 hours.   DDimer No results for input(s): DDIMER in the last 168 hours.   Radiology    DG Chest Port 1 View  Result Date: 06/21/2019 CLINICAL DATA:  Midsternal chest pain. EXAM: PORTABLE CHEST 1  VIEW COMPARISON:  Radiograph 05/31/2019 FINDINGS: Post median sternotomy. Unchanged cardiomegaly. Unchanged mediastinal contours. Unchanged vascular congestion from prior exam. Blunting of the costophrenic angles consistent with small effusions. No pneumothorax. No focal airspace disease. IMPRESSION: Unchanged cardiomegaly, vascular congestion, and small pleural effusions. No acute findings. Electronically Signed   By: Keith Rake M.D.   On: 06/21/2019 05:33    Cardiac Studies   No new cardiac data  Patient Profile     85 y.o. female with a hx of CAD (s/p CABG 2017, NSTEMI 01/2019 with PCI), chronic combined CHF (EF 30-35% 01/2019), moderate MR, anemia, bradycardia/sinus node dysfunction, ESRD on HD, HLD and DM2 who is being seen today for the evaluation of chest pain and abnormal EKG at the request of Dr. Lorin Mercy.  Assessment & Plan    1. Non-ST elevation myocardial infarction, likely due to restenosis of the saphenous vein graft to the marginal which supplies collaterals to the PDA.  Recommended coronary angiography which after extensive conversation yesterday and this morning, the patient refuses. 2. Goals of care: Discussed considering DO NOT RESUSCITATE orders to avoid escalation to life support.  Discussed palliative care and hospice, which was not well received.  Discussed consideration of the management that she would desire if the pain comes back, does not go away, and she is having a heart attack.  Would she want a heart cath under those circumstances?.  She is refusing to take medications today.  She became agitated last night.  She wants to go home.  The daughter says that she and the family will discuss longer-term management strategies such as acute MI intervention should that occur.  Discussed with Dr. Lorin Mercy. [facility] will sign off.   Medication Recommendations: No changes Other recommendations (labs, testing, etc): None Follow up as an outpatient: As needed  For questions  or updates, please contact [facility] Please consult [email or web address] for contact info under        Signed, Sinclair Grooms, MD  06/22/2019, 1:09 PM

## 2019-06-22 NOTE — Progress Notes (Signed)
Pt returned from dialysis at approx 11:45. MD in to speak w/ pt and daughter. Pt to d/c today after PT/OT. Attempt made at administering morning meds- pt refusing stating she would like to take at home today. Daughter agreed she would help give those to pt when home. Pt refusing to also have blood sugar checked at this time.

## 2019-06-22 NOTE — Progress Notes (Signed)
Pt became agitated and upset this morning. Stated she wanted to go home. She refused labs to be drawn and also pulled out her IV. Rcvd order from NP on call to allow daughter to come to bedside outside of visiting hours. Once daughter was at bedside I was able to obtain pt's vitals and EKG. Pt was agreeable to go to HD. Pt still needs additional IV site and nitro and heparin to be restarted. Reported this information to dayshift RN. Also, daughter stated pt needed Imdur prior  To HD, paged on call NP. Did not rcv order to give.

## 2019-06-22 NOTE — Progress Notes (Signed)
It was reported that pt became agitated overnight and pulled out PIV site. IV drips had to be stopped by night shift nurse. Pt transferred to dialysis this AM. Order placed for pt to receive another IV site. No chest pain reported per dialysis nurse. MD notified- will re start once pt arrives back to unit if orders continue.

## 2019-06-22 NOTE — Plan of Care (Signed)
  Problem: Clinical Measurements: Goal: Will remain free from infection Outcome: Progressing   Problem: Activity: Goal: Risk for activity intolerance will decrease Outcome: Progressing   Problem: Nutrition: Goal: Adequate nutrition will be maintained Outcome: Progressing   Problem: Coping: Goal: Level of anxiety will decrease Outcome: Progressing   

## 2019-06-23 DIAGNOSIS — D509 Iron deficiency anemia, unspecified: Secondary | ICD-10-CM | POA: Diagnosis not present

## 2019-06-23 DIAGNOSIS — N186 End stage renal disease: Secondary | ICD-10-CM | POA: Diagnosis not present

## 2019-06-23 NOTE — Progress Notes (Signed)
Cardiology Office Note   Date:  06/27/2019   ID:  Monica [name], DOB [birth year], MRN [id number]  PCP:  Benito Mccreedy, MD  Cardiologist: Dr. Ena Dawley, MD  Chief Complaint  [name] presents with   Hospitalization Follow-up    History of Present Illness: Monica [name] is a 85 y.o. female who presents for hospital follow-up, seen for Dr. [name].  Monica [name] has a history of CAD (s/p CABG 2017, NSTEMI 01/2019 with PCI), chronic combined CHF (EF 30-35% 01/2019), moderate MR, anemia, bradycardia/sinus node dysfunction, ESRD on HD, HLD and DM2 who was seen for chest pain and abnormal EKG on 06/21/2019 during Monica hospitalization.   In early July 2020 Monica [name] was admitted with NSTEMI. Cath showed occluded SVG to distal RCA, severe stent and in-stent restenosis of SVG to OM with successful PTCA with overlapping DES stent and successful DES to Monica ostial proximal SVG to Monica RI. Echo showed reduced EF 30-35% with moderate bilateral enlargement, moderate MR, and mild AI. She was started on ASA, Plavix and metoprolol. She was readmitted in late July for bradycardia and BB was stopped. She readmitted again in August for chest pain and elevated troponin, peak 264. Nuclear stress test was abnormal and repeat cath showed stable disease with no targets for PCI. Imdur was increased and she was started on low dose carvedilol. An event monitor showed sinus bradycardia to sinus rhythm, first degree AV block and short runs of NSVT, longest 6 beats. [name] was hospitalized 9/10 for volume overload. Hydralazine was decreased to 25 mg TID.   [name] has a h/o of chest pain during and after dialysis. She had one event within Monica last couple months for which Monica [name] denied transportation to Monica ED. She had another visit to Monica ED 05/31/19 for chest pain after an extra HD session. It was originally called in as a STEMI, but it was canceled on arrival. Hs troponin peaked to 80. [name] reoported taking half  Monica Imdur dose on HD days (60 mg ) and this was increased to a full dose on HD days. Monica original plan was for admission with repeat echo and work up for cardiac amyloid, but both Monica daughter and [name] were requesting a discharge.   06/21/2019 [name] presented once again to Monica ED for chest pain that began 1 day prior.  EKG showed ST depression in inferior lateral leads with initial troponin at 29 and repeat at 275.  She was therefore admitted to hospitalist service and cardiology was consulted.  Under normal circumstances, a repeat cardiac cath would be indicated however after discussion with [name] and daughter, plan was for continued medical therapy.  If recurrent pain, plan was to reinitiate conversation regarding repeat cath.  Per Dr. Tamala Julian, "if there is in fact restenosis of Monica obtuse marginal graft, opportunity for long-term patency will be greatly compromised as she already has overlapping stents in this vessel, and a diabetic who has end-stage renal disease. In this situation, Monica stent is destined to fail". She was ultimately discharged on 06/22/2019 cardiac medication changes. Was refusing medications and thought to be incompetent per daughter.   Today she presents with her son and reports she has been doing very well since hospital discharge. Denies recurrent CV symptoms with chest pain, LE swelling, orthopnea, palpitations, dizziness or syncope.  Has undergone HD twice since hospital discharge without issues.  Cath site is stable without signs or symptoms of hematoma.  They have several questions about if Monica [name]  has recurrent symptoms, what are they to do. They state that every time she has chest pain and goes to Monica hospital, her enzymes are elevated and cath is pursued.  [name] and son are comfortable with continued medical management at this time. I recommended close follow-up with primary cardiologist for further discussion and review of imaging to make a final decision on future  LHC.  We did review cath report with [name] and son which states no focal targets for PCI with recommendations for continued medical management. Tolerating Imdur well.  BP stable.   Past Medical History:  Diagnosis Date   Anemia    Asthma    Chronic combined systolic and diastolic CHF (congestive heart failure) ([place])    a. Echo 11/19 [facility]):  EF 55-65, normal wall motion, normal diastolic function. b. Echo 01/2019 EF 30-35%, moderate MR, mild AI.   Coronary artery disease    a. s/p CABG 2017. b.s/p NSTEMI 11/19 [facility]) >> S-PDA 100 >>PCI: 2.25 x 24 mm Synergy DES to S-OM. c. NSTEMI 01/2019 - occluded SVG-distal RCA, severe proximal to stent/ISR of SVG-OM s/p overlapping DES, DES to ostial prox SVG to Monica RI.    ESRD (end stage renal disease)    Dialysis Tu, Th, Sa   Essential hypertension    Gastroesophageal reflux disease    History of blood transfusion    Hyperlipidemia    Junctional bradycardia    Moderate mitral regurgitation    Peptic ulcer    Protein calorie malnutrition (HCC)    Sinus node dysfunction (HCC)    Type 2 diabetes mellitus ([place])     Past Surgical History:  Procedure Laterality Date   ABDOMINAL HYSTERECTOMY     CARDIAC CATHETERIZATION N/A 09/17/2015   Procedure: Left Heart Cath and Coronary Angiography;  Surgeon: Jettie Booze, MD;  Location: [place] [facility];  Service: Cardiovascular;  Laterality: N/A;   COLON SURGERY     CORONARY ARTERY BYPASS GRAFT N/A 09/19/2015   Procedure: CORONARY ARTERY BYPASS GRAFTING (CABG) TIMES FOUR USING BILATARAL SAPHENOUS VEIN GRAFTS AND LEFT INTERNAL MAMMARY ARTERY;  Surgeon: [name], MD;  Location: [place];  Service: Open Heart Surgery;  Laterality: N/A;   CORONARY STENT INTERVENTION N/A 01/10/2019   Procedure: CORONARY STENT INTERVENTION;  Surgeon: Leonie Man, MD;  Location: [place] [facility];  Service: Cardiovascular;  Laterality: N/A;   LEFT HEART CATH AND CORS/GRAFTS  ANGIOGRAPHY N/A 01/10/2019   Procedure: LEFT HEART CATH AND CORS/GRAFTS ANGIOGRAPHY;  Surgeon: Leonie Man, MD;  Location: [place] [facility];  Service: Cardiovascular;  Laterality: N/A;   LEFT HEART CATH AND CORS/GRAFTS ANGIOGRAPHY N/A 02/11/2019   Procedure: LEFT HEART CATH AND CORS/GRAFTS ANGIOGRAPHY;  Surgeon: Burnell Blanks, MD;  Location: [place] [facility];  Service: Cardiovascular;  Laterality: N/A;   TEE WITHOUT CARDIOVERSION N/A 09/19/2015   Procedure: TRANSESOPHAGEAL ECHOCARDIOGRAM (TEE);  Surgeon: [name], MD;  Location: [place];  Service: Open Heart Surgery;  Laterality: N/A;     Current Outpatient Medications  Medication Sig Dispense Refill   acetaminophen (TYLENOL) 500 MG tablet Take 1 tablet (500 mg total) by mouth every 6 (six) hours as needed for mild pain or headache. 30 tablet 0   albuterol (PROVENTIL HFA;VENTOLIN HFA) 108 (90 Base) MCG/ACT inhaler Inhale 2 puffs into Monica lungs every 6 (six) hours as needed for wheezing or shortness of breath.     allopurinol (ZYLOPRIM) 100 MG tablet Take 100 mg by mouth  2 (two) times daily.      aspirin EC 81 MG EC tablet Take 1 tablet (81 mg total) by mouth daily.     atorvastatin (LIPITOR) 80 MG tablet Take 80 mg by mouth daily at 6 PM.      brimonidine (ALPHAGAN P) 0.1 % SOLN Place 1 drop into both eyes 2 (two) times daily.     Calcium Carbonate Antacid (CALCIUM CARBONATE, DOSED IN MG ELEMENTAL CALCIUM,) 1250 MG/5ML SUSP Take 5 mLs (500 mg of elemental calcium total) by mouth every 6 (six) hours as needed for indigestion. 450 mL 0   camphor-menthol (SARNA) lotion Apply 1 application topically every 8 (eight) hours as needed for itching. 222 mL 0   carvedilol (COREG) 12.5 MG tablet Take 1 tablet (12.5 mg total) by mouth 2 (two) times daily. 180 tablet 3   Cholecalciferol (VITAMIN D-3) 1000 units CAPS Take 1,000 Units by mouth daily.      clopidogrel (PLAVIX) 75 MG tablet Take 1 tablet (75 mg total) by mouth  daily. 90 tablet 1   ferric citrate (AURYXIA) 1 GM 210 MG(Fe) tablet Take 210 mg by mouth 3 (three) times daily with meals.      hydrALAZINE (APRESOLINE) 25 MG tablet Take 1 tablet (25 mg total) by mouth 2 (two) times daily. 180 tablet 1   isosorbide mononitrate (IMDUR) 60 MG 24 hr tablet Take 120 mg on Monday, Wednesday, Friday and Sunday.   Take 60 mg on (Tuesday, Thursday and Saturday)  Prior  to Dialysis and 60 mg After dialysis/ ([name] taking differently: Take 60 mg by mouth 2 (two) times daily. On Tuesday, Thursday and Saturday, take both doses before dialysis.) 120 tablet 0   latanoprost (XALATAN) 0.005 % ophthalmic solution Place 1 drop into both eyes at bedtime.     loratadine (CLARITIN) 10 MG tablet Take 10 mg by mouth as needed for allergies.      Melatonin 5 MG CAPS Take 5 mg by mouth at bedtime.      multivitamin (RENA-VIT) TABS tablet Take 1 tablet by mouth daily.     nitroGLYCERIN (NITROSTAT) 0.4 MG SL tablet Place 1 tablet (0.4 mg total) under Monica tongue every 5 (five) minutes as needed for chest pain. 25 tablet 12   pantoprazole (PROTONIX) 20 MG tablet Take 20 mg by mouth daily.     No current facility-administered medications for this visit.    Allergies:   [name] has no known allergies.    Social History:  Monica [name]  reports that she has never smoked. She has quit using smokeless tobacco.  Her smokeless tobacco use included snuff. She reports that she does not drink alcohol or use drugs.   Family History:  Monica [name]'s family history includes Heart attack in her father.    ROS:  Please see Monica history of present illness.   Otherwise, review of systems are positive for none.  All other systems are reviewed and negative.    PHYSICAL EXAM: VS:  There were no vitals taken for this visit. , BMI There is no height or weight on file to calculate BMI.   General: Elderly, NAD Skin: Warm, dry, intact  Neck: Negative for carotid bruits. No JVD Lungs:Clear to  ausculation bilaterally. No wheezes, rales, or rhonchi. Breathing is unlabored. Cardiovascular: RRR with S1 S2. No murmur Extremities: No edema. No clubbing or cyanosis. DP pulses 2+ bilaterally Neuro: Alert and oriented. No focal deficits. No facial asymmetry. MAE spontaneously. Psych: Responds to questions appropriately with  normal affect.     EKG:  EKG is not ordered today.  Recent Labs: 05/31/2019: B Natriuretic Peptide 3,301.8 06/21/2019: ALT 57; TSH 4.803 06/22/2019: BUN 38; Creatinine, Ser 6.20; Hemoglobin 12.3; Platelets 246; Potassium 4.8; Sodium 136    Lipid Panel    Component Value Date/Time   CHOL 114 06/22/2019 0819   CHOL 132 04/29/2019 0940   TRIG 51 06/22/2019 0819   HDL 61 06/22/2019 0819   HDL 48 04/29/2019 0940   CHOLHDL 1.9 06/22/2019 0819   VLDL 10 06/22/2019 0819   LDLCALC 43 06/22/2019 0819   LDLCALC 68 04/29/2019 0940     Wt Readings from Last 3 Encounters:  06/22/19 158 lb 8.2 oz (71.9 kg)  05/31/19 162 lb (73.5 kg)  05/02/19 163 lb (73.9 kg)    Other studies Reviewed: Additional studies/ records that were reviewed today include:   LHC 02/11/19  Dist LM to Prox LAD lesion is 30% stenosed.  Mid LAD-1 lesion is 95% stenosed.  Mid LAD-2 lesion is 100% stenosed.  Ost Cx to Mid Cx lesion is 100% stenosed.  Prox RCA lesion is 70% stenosed.  Prox RCA to Mid RCA lesion is 90% stenosed.  Mid RCA lesion is 95% stenosed.  Dist RCA lesion is 100% stenosed with 100% stenosed side branch in RPAV.  Ramus lesion is 100% stenosed.  LIMA and is normal in caliber.  There is no competitive flow  SVG.  Monica graft exhibits severe diffuse disease.  Origin to Prox Graft lesion is 90% stenosed.  Prox Graft to Mid Graft lesion is 80% stenosed.  Dist Graft lesion is 100% stenosed.  SVG.  SVG.  Previously placed Origin to Prox Graft stent (unknown type) is widely patent.  Previously placed Dist Graft stent (unknown type) is widely patent.  1.  Chronic occlusion of Monica LAD is known. Monica LIMA to Monica LAD is patent 2. Chronic occlusion of Monica Circumflex is known. Monica SVG to Monica ramus intermediate is patent with patent proximal stent.  Monica SVG to Monica OM is patent with patent distal body stent.  3. Chronic occlusion mid RCA. Monica SVG to Monica RCA is known to be occluded (not selectively engaged today).   Recommendations: No focal targets for PCI. Continue medical management. Consider addition of Ranexa.     ASSESSMENT AND PLAN:  1.  Non-ST elevation MI known CAD: -Admitted, as above 06/21/2019 with chest pain and elevated CE's felt to be due to restenosis of Monica SVG to Monica marginal which supplies collaterals to Monica PDA.  Recommendation per Dr. Tamala Julian, was for coronary angiography however [name] deferred on multiple levels. -Needs close follow-up with Dr. [name] to review LHC imaging as they appreciate her advice -No recurrent symptoms since hospital discharge -Continue ASA, statin, carvedilol, Plavix -Continue Imdur 120 mg -?Ranexa if recurrent symptoms  2.  ESRD on HD: -Hemodialysis Tuesday, Thursday, Saturday -Per nephrology  3.  Hypertension: -Stable, 122/70 -Imaged with Imdur, carvedilol, hydralazine  4.  Hyperlipidemia: -LDL, 43 -Continue current regimen   5.  DM2: -Last HbA1c, 9.6 -Managed per PCP   Current medicines are reviewed at length with Monica [name] today.  Monica [name] does not have concerns regarding medicines.  Monica following changes have been made:  no change  Labs/ tests ordered today include: None  No orders of Monica defined types were placed in this encounter.    Disposition:   FU with Dr. [name] in 1 month  Signed, Kathyrn Drown, NP  06/27/2019 1:58 PM  [facility] [address], [place], [place]  [address] Phone: [phone]; Fax: [phone]

## 2019-06-24 ENCOUNTER — Other Ambulatory Visit: Payer: Self-pay

## 2019-06-24 NOTE — Patient Outreach (Signed)
[facility]) Care Management  06/24/2019  Monica [name] 1930/11/28 [id number]    Transition of Care Referral  Referral Date: 06/22/2019  Referral Source:Discharge Report Date of Admission: 06/21/2019 Diagnosis: "NSTEMI" Date of Discharge: 06/22/2019 Facility: [place]    Outreach attempt # 1 to patient. Spoke with patient who was alert and oriented at present and able to answer RN CM questions. Patient voices that she is doing "just fine" since returning home from the hospital. She denies any chest pain or cardiac issues. She reports that she was experiencing chest pain on admission and shares how she "did not let them do anything" to her. Patient state that her daughter from [place] is staying with her but will possibly be returning home soon. She voices that her son is able to assist her as needed. She states she has a few meds to pick up from the pharmacy. Patient unable to complete med review at this time but states her children are helping her with her meds. She denies any RN CM needs or concerns at this time.     Plan: RN CM will make outreach to patient within one week. Patient gave verbal consent and in agreement with RN CM follow up and timeframe. Patient aware that they may contact RN CM sooner for any issues or concerns.   Enzo Montgomery, RN,BSN,CCM [place] Management Telephonic Care Management Coordinator Direct Phone: [phone] Toll Free: [phone] Fax: [phone]

## 2019-06-25 DIAGNOSIS — N186 End stage renal disease: Secondary | ICD-10-CM | POA: Diagnosis not present

## 2019-06-25 DIAGNOSIS — D509 Iron deficiency anemia, unspecified: Secondary | ICD-10-CM | POA: Diagnosis not present

## 2019-06-27 ENCOUNTER — Ambulatory Visit (INDEPENDENT_AMBULATORY_CARE_PROVIDER_SITE_OTHER): Payer: Medicare HMO | Admitting: Cardiology

## 2019-06-27 ENCOUNTER — Encounter: Payer: Self-pay | Admitting: Cardiology

## 2019-06-27 ENCOUNTER — Other Ambulatory Visit: Payer: Self-pay

## 2019-06-27 VITALS — BP 122/64 | HR 65 | Ht 62.0 in | Wt 159.0 lb

## 2019-06-27 DIAGNOSIS — I214 Non-ST elevation (NSTEMI) myocardial infarction: Secondary | ICD-10-CM | POA: Diagnosis not present

## 2019-06-27 DIAGNOSIS — N186 End stage renal disease: Secondary | ICD-10-CM

## 2019-06-27 DIAGNOSIS — E1151 Type 2 diabetes mellitus with diabetic peripheral angiopathy without gangrene: Secondary | ICD-10-CM

## 2019-06-27 DIAGNOSIS — I2510 Atherosclerotic heart disease of native coronary artery without angina pectoris: Secondary | ICD-10-CM

## 2019-06-27 DIAGNOSIS — I255 Ischemic cardiomyopathy: Secondary | ICD-10-CM | POA: Diagnosis not present

## 2019-06-27 NOTE — Patient Instructions (Signed)
Medication Instructions:   Your physician recommends that you continue on your current medications as directed. Please refer to the Current Medication list given to you today.\  *If you need a refill on your cardiac medications before your next appointment, please call your pharmacy*  Lab Work:  None ordered today  Testing/Procedures:  None ordered today  Follow-Up: At [facility], you and your health needs are our priority.  As part of our continuing mission to provide you with exceptional heart care, we have created designated Provider Care Teams.  These Care Teams include your primary Cardiologist (physician) and Advanced Practice Providers (APPs -  Physician Assistants and Nurse Practitioners) who all work together to provide you with the care you need, when you need it.  Your next appointment:   2 month(s)  The format for your next appointment:   In Person  Provider:   Ena Dawley, MD

## 2019-06-28 DIAGNOSIS — D631 Anemia in chronic kidney disease: Secondary | ICD-10-CM | POA: Diagnosis not present

## 2019-06-28 DIAGNOSIS — N186 End stage renal disease: Secondary | ICD-10-CM | POA: Diagnosis not present

## 2019-06-30 ENCOUNTER — Other Ambulatory Visit: Payer: Self-pay

## 2019-06-30 DIAGNOSIS — N186 End stage renal disease: Secondary | ICD-10-CM | POA: Diagnosis not present

## 2019-06-30 DIAGNOSIS — D631 Anemia in chronic kidney disease: Secondary | ICD-10-CM | POA: Diagnosis not present

## 2019-06-30 NOTE — Patient Outreach (Signed)
[facility]) Care Management  06/30/2019  Dianely Harring 1930/12/18 [id number]   Transition of Care Week #2    Outreach attempt #1 to patient.No answer at present.   Plan: RN CM will make outreach attempt to patient within 3-4 business days.  Enzo Montgomery, RN,BSN,CCM [place] Management Telephonic Care Management Coordinator Direct Phone: [phone] Toll Free: [phone] Fax: [phone]

## 2019-07-03 DIAGNOSIS — D631 Anemia in chronic kidney disease: Secondary | ICD-10-CM | POA: Diagnosis not present

## 2019-07-03 DIAGNOSIS — N186 End stage renal disease: Secondary | ICD-10-CM | POA: Diagnosis not present

## 2019-07-04 ENCOUNTER — Other Ambulatory Visit: Payer: Self-pay

## 2019-07-04 ENCOUNTER — Other Ambulatory Visit: Payer: Self-pay | Admitting: Cardiology

## 2019-07-04 DIAGNOSIS — E782 Mixed hyperlipidemia: Secondary | ICD-10-CM

## 2019-07-04 DIAGNOSIS — I2510 Atherosclerotic heart disease of native coronary artery without angina pectoris: Secondary | ICD-10-CM

## 2019-07-04 DIAGNOSIS — I10 Essential (primary) hypertension: Secondary | ICD-10-CM

## 2019-07-04 NOTE — Patient Outreach (Signed)
[facility]) Care Management  07/04/2019  Denene Alamillo [birth year] [id number]   Transition of Care    Outreach attempt #2 to patient. Spoke with patient who denies any acute issues or concerns at present. She continues to report that she is doing and feeling well. She denies any chest pain or other cardiac symptoms since returning home from the hospital. Patient had cardiology follow up appt on last week and reports appt went well and no new changes to treatment plan. She is scheduled for regular dialysis treatment on tomorrow. Patient continues to voice that she tolerating dialysis treatments well. She report appetite remains fair and WNL for her. She reports she had a good holiday with some of her family. Son remains int he home and is supportive at assisting her as needed. She denies any RN CM needs or concerns at this time.    [facility] CM Care Plan Problem One     Most Recent Value  Care Plan Problem One  Patient with impaired cardiac status/functioning  Role Documenting the Problem One  Care Management Telephonic Coordinator  Care Plan for Problem One  Active  [facility] Long Term Goal   Patient will report no further abnormal cardiac events over the next 90 days.  [facility] Long Term Goal Start Date  07/04/19  Interventions for Problem One Long Term Goal  RN CM assessed for any acue sxs/issues. RN CM discussed with pt s/s to monitor and when to seek medical attention. RN CM reviewed action plan with pt.  [facility] CM Short Term Goal #1   Patient will report no CP or other cardiac sxs over the next 30 days.  Interventions for Short Term Goal #1  RN CM discussed and educated pt on s/s of chest pain and cardiac event  [facility] CM Short Term Goal #2   Patient will be ab, le to verbalise at least 2-3 s/s of heart attack over the next 30 days.  [facility] CM Short Term Goal #2 Start Date  07/04/19  Interventions for Short Term Goal #2  RN CM discussed and educated pt on s/s of heart attack,action plan and when  to seek medical attention.     [facility] CM Care Plan Problem Three     Most Recent Value  Care Plan Problem Three  Knowledge deficit related to chronic conditions and mgmt of sxs  Role Documenting the Problem Three  Care Management Telephonic Coordinator  Care Plan for Problem Three  Active  [facility] Long Term Goal   Patient will have no hospitalizations within the next 90 days.  [facility] Long Term Goal Start Date  05/23/19  [facility] CM Short Term Goal #1   Patient will monitor and record daily weights and BP in the home over the next 30 days.  [facility] CM Short Term Goal #1 Start Date  05/23/19  [facility] CM Short Term Goal #1 Met Date  07/04/19  [facility] CM Short Term Goal #2   Patient will report being able to tolerate HD txs without any issues/sxs over the next 30 days.  [facility] CM Short Term Goal #2 Start Date  05/23/19  [facility] CM Short Term Goal #2 Met Date  07/04/19        Plan: RN CM discussed with patient next outreach within a week. Patient gave verbal consent and in agreement with RN CM follow up and timeframe. Patient aware that they may contact RN CM sooner for any issues or concerns.  RN CM will send quarterly update to  PCP.   Enzo Montgomery, RN,BSN,CCM [place] Management Telephonic Care Management Coordinator Direct Phone: [phone] Toll Free: [phone] Fax: [phone]

## 2019-07-05 DIAGNOSIS — N186 End stage renal disease: Secondary | ICD-10-CM | POA: Diagnosis not present

## 2019-07-05 DIAGNOSIS — D631 Anemia in chronic kidney disease: Secondary | ICD-10-CM | POA: Diagnosis not present

## 2019-07-06 ENCOUNTER — Ambulatory Visit: Payer: Self-pay

## 2019-07-07 DIAGNOSIS — N186 End stage renal disease: Secondary | ICD-10-CM | POA: Diagnosis not present

## 2019-07-07 DIAGNOSIS — D631 Anemia in chronic kidney disease: Secondary | ICD-10-CM | POA: Diagnosis not present

## 2019-07-07 DIAGNOSIS — Z992 Dependence on renal dialysis: Secondary | ICD-10-CM | POA: Diagnosis not present

## 2019-07-10 DIAGNOSIS — N186 End stage renal disease: Secondary | ICD-10-CM | POA: Diagnosis not present

## 2019-07-10 DIAGNOSIS — D631 Anemia in chronic kidney disease: Secondary | ICD-10-CM | POA: Diagnosis not present

## 2019-07-10 DIAGNOSIS — E785 Hyperlipidemia, unspecified: Secondary | ICD-10-CM | POA: Diagnosis not present

## 2019-07-10 DIAGNOSIS — N2581 Secondary hyperparathyroidism of renal origin: Secondary | ICD-10-CM | POA: Diagnosis not present

## 2019-07-11 ENCOUNTER — Inpatient Hospital Stay (HOSPITAL_COMMUNITY)
Admission: EM | Admit: 2019-07-11 | Discharge: 2019-07-13 | DRG: 246 | Disposition: A | Discharge status: Home Health | Payer: Medicare HMO | Attending: Cardiology | Admitting: Cardiology

## 2019-07-11 ENCOUNTER — Encounter (HOSPITAL_COMMUNITY)
Admission: EM | Disposition: A | Discharge status: Home Health | Payer: Self-pay | Source: Home / Self Care | Attending: Cardiology

## 2019-07-11 ENCOUNTER — Other Ambulatory Visit: Payer: Self-pay

## 2019-07-11 ENCOUNTER — Emergency Department (HOSPITAL_COMMUNITY): Payer: Medicare HMO

## 2019-07-11 DIAGNOSIS — Z8249 Family history of ischemic heart disease and other diseases of the circulatory system: Secondary | ICD-10-CM

## 2019-07-11 DIAGNOSIS — E8889 Other specified metabolic disorders: Secondary | ICD-10-CM | POA: Diagnosis present

## 2019-07-11 DIAGNOSIS — I25700 Atherosclerosis of coronary artery bypass graft(s), unspecified, with unstable angina pectoris: Secondary | ICD-10-CM | POA: Diagnosis present

## 2019-07-11 DIAGNOSIS — I200 Unstable angina: Secondary | ICD-10-CM

## 2019-07-11 DIAGNOSIS — N2581 Secondary hyperparathyroidism of renal origin: Secondary | ICD-10-CM | POA: Diagnosis not present

## 2019-07-11 DIAGNOSIS — I10 Essential (primary) hypertension: Secondary | ICD-10-CM | POA: Diagnosis present

## 2019-07-11 DIAGNOSIS — Z992 Dependence on renal dialysis: Secondary | ICD-10-CM

## 2019-07-11 DIAGNOSIS — I25110 Atherosclerotic heart disease of native coronary artery with unstable angina pectoris: Secondary | ICD-10-CM | POA: Diagnosis present

## 2019-07-11 DIAGNOSIS — I499 Cardiac arrhythmia, unspecified: Secondary | ICD-10-CM | POA: Diagnosis not present

## 2019-07-11 DIAGNOSIS — Z794 Long term (current) use of insulin: Secondary | ICD-10-CM

## 2019-07-11 DIAGNOSIS — R0789 Other chest pain: Secondary | ICD-10-CM | POA: Diagnosis not present

## 2019-07-11 DIAGNOSIS — Z7982 Long term (current) use of aspirin: Secondary | ICD-10-CM | POA: Diagnosis not present

## 2019-07-11 DIAGNOSIS — N186 End stage renal disease: Secondary | ICD-10-CM | POA: Diagnosis present

## 2019-07-11 DIAGNOSIS — N250 Renal osteodystrophy: Secondary | ICD-10-CM | POA: Diagnosis not present

## 2019-07-11 DIAGNOSIS — R001 Bradycardia, unspecified: Secondary | ICD-10-CM | POA: Diagnosis present

## 2019-07-11 DIAGNOSIS — I5043 Acute on chronic combined systolic (congestive) and diastolic (congestive) heart failure: Secondary | ICD-10-CM | POA: Diagnosis not present

## 2019-07-11 DIAGNOSIS — Y831 Surgical operation with implant of artificial internal device as the cause of abnormal reaction of the patient, or of later complication, without mention of misadventure at the time of the procedure: Secondary | ICD-10-CM | POA: Diagnosis present

## 2019-07-11 DIAGNOSIS — E669 Obesity, unspecified: Secondary | ICD-10-CM | POA: Diagnosis present

## 2019-07-11 DIAGNOSIS — I25810 Atherosclerosis of coronary artery bypass graft(s) without angina pectoris: Secondary | ICD-10-CM | POA: Diagnosis not present

## 2019-07-11 DIAGNOSIS — Z79899 Other long term (current) drug therapy: Secondary | ICD-10-CM | POA: Diagnosis not present

## 2019-07-11 DIAGNOSIS — I440 Atrioventricular block, first degree: Secondary | ICD-10-CM | POA: Diagnosis present

## 2019-07-11 DIAGNOSIS — Z955 Presence of coronary angioplasty implant and graft: Secondary | ICD-10-CM

## 2019-07-11 DIAGNOSIS — E785 Hyperlipidemia, unspecified: Secondary | ICD-10-CM | POA: Diagnosis present

## 2019-07-11 DIAGNOSIS — Z7902 Long term (current) use of antithrombotics/antiplatelets: Secondary | ICD-10-CM | POA: Diagnosis not present

## 2019-07-11 DIAGNOSIS — Z8616 Personal history of COVID-19: Secondary | ICD-10-CM | POA: Diagnosis not present

## 2019-07-11 DIAGNOSIS — I25709 Atherosclerosis of coronary artery bypass graft(s), unspecified, with unspecified angina pectoris: Secondary | ICD-10-CM | POA: Diagnosis present

## 2019-07-11 DIAGNOSIS — K219 Gastro-esophageal reflux disease without esophagitis: Secondary | ICD-10-CM | POA: Diagnosis present

## 2019-07-11 DIAGNOSIS — E875 Hyperkalemia: Secondary | ICD-10-CM | POA: Diagnosis present

## 2019-07-11 DIAGNOSIS — I214 Non-ST elevation (NSTEMI) myocardial infarction: Secondary | ICD-10-CM

## 2019-07-11 DIAGNOSIS — E66811 Obesity, class 1: Secondary | ICD-10-CM | POA: Diagnosis present

## 2019-07-11 DIAGNOSIS — E1122 Type 2 diabetes mellitus with diabetic chronic kidney disease: Secondary | ICD-10-CM | POA: Diagnosis present

## 2019-07-11 DIAGNOSIS — E782 Mixed hyperlipidemia: Secondary | ICD-10-CM | POA: Diagnosis not present

## 2019-07-11 DIAGNOSIS — Z8711 Personal history of peptic ulcer disease: Secondary | ICD-10-CM

## 2019-07-11 DIAGNOSIS — R079 Chest pain, unspecified: Secondary | ICD-10-CM | POA: Diagnosis not present

## 2019-07-11 DIAGNOSIS — I252 Old myocardial infarction: Secondary | ICD-10-CM

## 2019-07-11 DIAGNOSIS — I132 Hypertensive heart and chronic kidney disease with heart failure and with stage 5 chronic kidney disease, or end stage renal disease: Secondary | ICD-10-CM | POA: Diagnosis present

## 2019-07-11 DIAGNOSIS — R0902 Hypoxemia: Secondary | ICD-10-CM | POA: Diagnosis not present

## 2019-07-11 DIAGNOSIS — R42 Dizziness and giddiness: Secondary | ICD-10-CM | POA: Diagnosis not present

## 2019-07-11 DIAGNOSIS — Z6830 Body mass index (BMI) 30.0-30.9, adult: Secondary | ICD-10-CM | POA: Diagnosis not present

## 2019-07-11 DIAGNOSIS — D649 Anemia, unspecified: Secondary | ICD-10-CM | POA: Diagnosis not present

## 2019-07-11 DIAGNOSIS — U071 COVID-19: Secondary | ICD-10-CM | POA: Diagnosis not present

## 2019-07-11 DIAGNOSIS — I2510 Atherosclerotic heart disease of native coronary artery without angina pectoris: Secondary | ICD-10-CM | POA: Diagnosis not present

## 2019-07-11 DIAGNOSIS — I5040 Unspecified combined systolic (congestive) and diastolic (congestive) heart failure: Secondary | ICD-10-CM | POA: Diagnosis not present

## 2019-07-11 DIAGNOSIS — J45909 Unspecified asthma, uncomplicated: Secondary | ICD-10-CM | POA: Diagnosis present

## 2019-07-11 DIAGNOSIS — I255 Ischemic cardiomyopathy: Secondary | ICD-10-CM | POA: Diagnosis present

## 2019-07-11 DIAGNOSIS — E1129 Type 2 diabetes mellitus with other diabetic kidney complication: Secondary | ICD-10-CM | POA: Diagnosis present

## 2019-07-11 DIAGNOSIS — I130 Hypertensive heart and chronic kidney disease with heart failure and stage 1 through stage 4 chronic kidney disease, or unspecified chronic kidney disease: Secondary | ICD-10-CM | POA: Diagnosis not present

## 2019-07-11 HISTORY — PX: CORONARY STENT INTERVENTION: CATH118234

## 2019-07-11 HISTORY — PX: LEFT HEART CATH AND CORS/GRAFTS ANGIOGRAPHY: CATH118250

## 2019-07-11 LAB — COMPREHENSIVE METABOLIC PANEL
ALT: 27 U/L (ref 0–44)
AST: 41 U/L (ref 15–41)
Albumin: 3.4 g/dL — ABNORMAL LOW (ref 3.5–5.0)
Alkaline Phosphatase: 86 U/L (ref 38–126)
Anion gap: 15 (ref 5–15)
BUN: 33 mg/dL — ABNORMAL HIGH (ref 8–23)
CO2: 22 mmol/L (ref 22–32)
Calcium: 9.3 mg/dL (ref 8.9–10.3)
Chloride: 99 mmol/L (ref 98–111)
Creatinine, Ser: 6.85 mg/dL — ABNORMAL HIGH (ref 0.44–1.00)
GFR calc Af Amer: 6 mL/min — ABNORMAL LOW (ref >60)
GFR calc non Af Amer: 5 mL/min — ABNORMAL LOW (ref >60)
Glucose, Bld: 120 mg/dL — ABNORMAL HIGH (ref 70–99)
Potassium: 6.9 mmol/L (ref 3.5–5.1)
Sodium: 136 mmol/L (ref 135–145)
Total Bilirubin: 0.7 mg/dL (ref 0.3–1.2)
Total Protein: 7.3 g/dL (ref 6.5–8.1)

## 2019-07-11 LAB — CBC WITH DIFFERENTIAL/PLATELET
Abs Immature Granulocytes: 0.01 10*3/uL (ref 0.00–0.07)
Basophils Absolute: 0.1 10*3/uL (ref 0.0–0.1)
Basophils Relative: 1 %
Eosinophils Absolute: 0.1 10*3/uL (ref 0.0–0.5)
Eosinophils Relative: 1 %
HCT: 51 % — ABNORMAL HIGH (ref 36.0–46.0)
Hemoglobin: 15.1 g/dL — ABNORMAL HIGH (ref 12.0–15.0)
Immature Granulocytes: 0 %
Lymphocytes Relative: 32 %
Lymphs Abs: 1.8 10*3/uL (ref 0.7–4.0)
MCH: 30 pg (ref 26.0–34.0)
MCHC: 29.6 g/dL — ABNORMAL LOW (ref 30.0–36.0)
MCV: 101.4 fL — ABNORMAL HIGH (ref 80.0–100.0)
Monocytes Absolute: 0.3 10*3/uL (ref 0.1–1.0)
Monocytes Relative: 4 %
Neutro Abs: 3.5 10*3/uL (ref 1.7–7.7)
Neutrophils Relative %: 62 %
Platelets: 231 10*3/uL (ref 150–400)
RBC: 5.03 MIL/uL (ref 3.87–5.11)
RDW: 18.7 % — ABNORMAL HIGH (ref 11.5–15.5)
WBC: 5.7 10*3/uL (ref 4.0–10.5)
nRBC: 0 % (ref 0.0–0.2)

## 2019-07-11 LAB — LIPID PANEL
Cholesterol: 144 mg/dL (ref 0–200)
HDL: 56 mg/dL (ref >40)
LDL Cholesterol: 60 mg/dL (ref 0–99)
Total CHOL/HDL Ratio: 2.6 RATIO
Triglycerides: 141 mg/dL (ref <150)
VLDL: 28 mg/dL (ref 0–40)

## 2019-07-11 LAB — PROTIME-INR
INR: 1 (ref 0.8–1.2)
Prothrombin Time: 13.4 seconds (ref 11.4–15.2)

## 2019-07-11 LAB — POCT ACTIVATED CLOTTING TIME
Activated Clotting Time: 296 seconds
Activated Clotting Time: 235 seconds
Activated Clotting Time: 202 seconds
Activated Clotting Time: 180 seconds

## 2019-07-11 LAB — POCT I-STAT, CHEM 8
BUN: 36 mg/dL — ABNORMAL HIGH (ref 8–23)
Calcium, Ion: 1.01 mmol/L — ABNORMAL LOW (ref 1.15–1.40)
Chloride: 102 mmol/L (ref 98–111)
Creatinine, Ser: 6.5 mg/dL — ABNORMAL HIGH (ref 0.44–1.00)
Glucose, Bld: 152 mg/dL — ABNORMAL HIGH (ref 70–99)
HCT: 54 % — ABNORMAL HIGH (ref 36.0–46.0)
Hemoglobin: 18.4 g/dL — ABNORMAL HIGH (ref 12.0–15.0)
Potassium: 6.2 mmol/L — ABNORMAL HIGH (ref 3.5–5.1)
Sodium: 135 mmol/L (ref 135–145)
TCO2: 27 mmol/L (ref 22–32)

## 2019-07-11 LAB — CBG MONITORING, ED
Glucose-Capillary: 68 mg/dL — ABNORMAL LOW (ref 70–99)
Glucose-Capillary: 59 mg/dL — ABNORMAL LOW (ref 70–99)

## 2019-07-11 LAB — TROPONIN I (HIGH SENSITIVITY): Troponin I (High Sensitivity): 301 ng/L (ref <18)

## 2019-07-11 LAB — RESPIRATORY PANEL BY RT PCR (FLU A&B, COVID)
Influenza A by PCR: NEGATIVE
Influenza B by PCR: NEGATIVE
SARS Coronavirus 2 by RT PCR: POSITIVE — AB

## 2019-07-11 LAB — APTT: aPTT: 33 seconds (ref 24–36)

## 2019-07-11 LAB — GLUCOSE, CAPILLARY: Glucose-Capillary: 90 mg/dL (ref 70–99)

## 2019-07-11 SURGERY — LEFT HEART CATH AND CORS/GRAFTS ANGIOGRAPHY
Anesthesia: LOCAL

## 2019-07-11 MED ORDER — BIVALIRUDIN BOLUS VIA INFUSION - CUPID
INTRAVENOUS | Status: DC | PRN
  Administered 2019-07-11 (×2): 54.45 mg via INTRAVENOUS

## 2019-07-11 MED ORDER — NITROGLYCERIN 1 MG/10 ML FOR IR/CATH LAB
INTRA_ARTERIAL | Status: AC
  Filled 2019-07-11: qty 10

## 2019-07-11 MED ORDER — HEPARIN BOLUS VIA INFUSION
4000.0000 [IU] | Freq: Once | INTRAVENOUS | Status: AC
  Administered 2019-07-11: 15:00:00 4000 [IU] via INTRAVENOUS
  Filled 2019-07-11: qty 4000

## 2019-07-11 MED ORDER — IOHEXOL 350 MG/ML SOLN
INTRAVENOUS | Status: DC | PRN
  Administered 2019-07-11: 17:00:00 105 mL

## 2019-07-11 MED ORDER — SODIUM CHLORIDE 0.9 % IV SOLN: INTRAVENOUS | Status: AC

## 2019-07-11 MED ORDER — ASPIRIN 81 MG PO CHEW: 324.0000 mg | CHEWABLE_TABLET | Freq: Once | ORAL | Status: AC

## 2019-07-11 MED ORDER — HEPARIN (PORCINE) IN NACL 1000-0.9 UT/500ML-% IV SOLN
INTRAVENOUS | Status: AC
  Filled 2019-07-11: qty 1000

## 2019-07-11 MED ORDER — SODIUM CHLORIDE 0.9% FLUSH: 3.0000 mL | Freq: Once | INTRAVENOUS | Status: DC

## 2019-07-11 MED ORDER — HEPARIN (PORCINE) IN NACL 1000-0.9 UT/500ML-% IV SOLN
INTRAVENOUS | Status: DC | PRN
  Administered 2019-07-11 (×2): 500 mL

## 2019-07-11 MED ORDER — HEPARIN (PORCINE) 25000 UT/250ML-% IV SOLN
800.0000 [IU]/h | INTRAVENOUS | Status: DC
  Administered 2019-07-11: 15:00:00 800 [IU]/h via INTRAVENOUS
  Filled 2019-07-11: qty 250

## 2019-07-11 MED ORDER — INSULIN ASPART 100 UNIT/ML ~~LOC~~ SOLN: 0.0000 [IU] | Freq: Three times a day (TID) | SUBCUTANEOUS | Status: DC

## 2019-07-11 MED ORDER — NITROGLYCERIN 1 MG/10 ML FOR IR/CATH LAB
INTRA_ARTERIAL | Status: DC | PRN
  Administered 2019-07-11: 200 ug via INTRACORONARY

## 2019-07-11 MED ORDER — LIDOCAINE HCL (PF) 1 % IJ SOLN
INTRAMUSCULAR | Status: AC
  Filled 2019-07-11: qty 30

## 2019-07-11 MED ORDER — INSULIN ASPART 100 UNIT/ML ~~LOC~~ SOLN: 0.0000 [IU] | Freq: Every day | SUBCUTANEOUS | Status: DC

## 2019-07-11 MED ORDER — BIVALIRUDIN TRIFLUOROACETATE 250 MG IV SOLR
INTRAVENOUS | Status: AC
  Filled 2019-07-11: qty 250

## 2019-07-11 MED ORDER — LIDOCAINE HCL (PF) 1 % IJ SOLN
INTRAMUSCULAR | Status: DC | PRN
  Administered 2019-07-11: 20 mL

## 2019-07-11 MED ORDER — SODIUM CHLORIDE 0.9 % IV SOLN
INTRAVENOUS | Status: DC | PRN
  Administered 2019-07-11: 0.25 mg/kg/h via INTRAVENOUS

## 2019-07-11 MED ORDER — CLOPIDOGREL BISULFATE 300 MG PO TABS
ORAL_TABLET | ORAL | Status: DC | PRN
  Administered 2019-07-11: 600 mg via ORAL

## 2019-07-11 MED ORDER — SODIUM CHLORIDE 0.9 % IV SOLN: INTRAVENOUS | Status: DC

## 2019-07-11 SURGICAL SUPPLY — 17 items
BALLN SAPPHIRE 2.0X12 (BALLOONS) ×2
BALLOON SAPPHIRE 2.0X12 (BALLOONS) ×1 IMPLANT
CATH DXT MULTI JL4 JR4 ANG PIG (CATHETERS) ×2 IMPLANT
CATH INFINITI 5FR JL5 (CATHETERS) ×2 IMPLANT
CATH LAUNCHER 6FR AL1 (CATHETERS) ×1 IMPLANT
CATHETER LAUNCHER 6FR AL1 (CATHETERS) ×2
KIT ENCORE 26 ADVANTAGE (KITS) ×2 IMPLANT
KIT HEART LEFT (KITS) ×2 IMPLANT
PACK CARDIAC CATHETERIZATION (CUSTOM PROCEDURE TRAY) ×2 IMPLANT
SHEATH PINNACLE 5F 10CM (SHEATH) ×2 IMPLANT
SHEATH PINNACLE 6F 10CM (SHEATH) ×4 IMPLANT
STENT SYNERGY XD 2.50X28 (Permanent Stent) ×1 IMPLANT
SYNERGY XD 2.50X28 (Permanent Stent) ×2 IMPLANT
TRANSDUCER W/STOPCOCK (MISCELLANEOUS) ×2 IMPLANT
TUBING CIL FLEX 10 FLL-RA (TUBING) ×2 IMPLANT
WIRE ASAHI PROWATER 180CM (WIRE) ×2 IMPLANT
WIRE EMERALD 3MM-J .035X150CM (WIRE) ×2 IMPLANT

## 2019-07-11 NOTE — ED Triage Notes (Signed)
Patient presents to the ED by EMS with c/o pressure at the center of the chest without radiation. Hx of MI. She reports waking up sweaty and with pain. She took 1 asa Dialysis patient last treatment yesterday EMS gave ASA PTA and 1 NITRO pressure still present pain has gone.   EKG with no posterior changes [id number] 156/78  97% RA 88P 97.2T

## 2019-07-11 NOTE — Progress Notes (Signed)
Patient states chest heaviness is getting better

## 2019-07-11 NOTE — Progress Notes (Signed)
Ate half of [place] sandwich and apple sauce. Resting quietly

## 2019-07-11 NOTE — H&P (Addendum)
Cardiology Admission History and Physical:   Patient ID: Monica [name] MRN: [id number]; DOB: 09-26-1930   Admission date: 07/11/2019  Primary Care Provider: Benito Mccreedy, MD Primary Cardiologist: Monica Dawley, MD  Primary Electrophysiologist:  None   Chief Complaint:  Chest pain  Patient Profile:   Monica [name] is a 86 y.o. female with a hx of CAD (s/p CABG 2017, NSTEMI 01/2019 with PCI), chronic combined CHF, moderate MR, anemia, bradycardia/sinus node dysfunction, ESRD on HD, GERD and DM2 who is being seen today for the evaluation of chest pain at the request of Dr, Monica [name]  History of Present Illness:   History obtained via phone call. Patient was COVID positive on her last admission 06/21/19. Rapid COVID pending.   Ms. Monica [name] is followed by Monica [name] for the above cardiac issues. In June 2020 she was admitted with a NSTEMI. Cath showed occluded SVG to distal RCA, severe stent and in-stent restenosis of SVG to OM with successful PTCA with overlapping stent DES stent and successful DES to ostial proximal SVG to the RI. Echo showed new reduced EF 30-35% with moderate bilateral enlargement, moderate MR, and mild AI. She was started on ASA, plavix and metoprolol. No ACE/ARB/Spiro due to renal function.   She was readmitted in July with fatigue and dizziness found to have junctional bradycardia in the 40s and BB was discontinued.   She was readmitted in August for chest pain and elevated troponin, peak 264. Nuclear stress test was abnormal and repeat cath showed stable disease with no targets for PCI. She was started on low dose Coreg. An event monitor showed sinus bradycardia and NSR, first degree AV block, short runs of NSVT (longest 6 beats).   After being discharged she had an event of chest pain during HD relieved by NTG but denied transportation to the ED.   She had an ED visit 05/31/19 for chest pain during HD (she had an extra session that week). Imdur was changed from non-HD  days to every day. Plan was for possible amyloid work-up but both the daughter and the patient were requesting discharge.   She was seen again in the ED Monica [name] [facility] 12/15 for chest pain minimally relieved by NTG. EKG showed T wave abnormality. She was transferred to Monica [name] for possible cath. Dr. Tamala [name] suspected possible restenosis of SVG to OM. Patient opted for medical therapy.   The patient was seen in the office 06/07/2019 for [name] follow-up. She was doing well from a cardiac standpoint.   The patient presented to the ED 07/11/19 for chest pain that began this morning at 9 or 10 AM. The pain woke the patient up out of sleep. It was a sharp substernal pain, 6/10 and non-radiating. Associated SOB and diaphoresis. No nausea or vomiting. Patient EMS was called and administered 1 NTG which completely relieved the pain. By the time the patient arrived she was chest pain free. In the ED labs showed potassium 4.8, glucose 210, creatinine 6.20, calcium 8.8. WBC 8.5 Hgb 12.3, Hs troponin pensing. EKG shows T wave depression infero/lateral leads. CXR shows stable cardiomegaly with mild interstitial prominence without overt edema. Cardiology was consulted to admit.   Heart Pathway Score:  HEAR Score: 6  Past Medical History:  Diagnosis Date  . Anemia   . Asthma   . Chronic combined systolic and diastolic CHF (congestive heart failure) ([place])    a. Echo 11/19 [name]):  EF 55-65, normal wall motion, normal diastolic function. b. Echo 01/2019 EF 30-35%, moderate  MR, mild AI.  Marland Kitchen Coronary artery disease    a. s/p CABG 2017. b.s/p NSTEMI 11/19 [name]) >> S-PDA 100 >>PCI: 2.25 x 24 mm Synergy DES to S-OM. c. NSTEMI 01/2019 - occluded SVG-distal RCA, severe proximal to stent/ISR of SVG-OM s/p overlapping DES, DES to ostial prox SVG to the RI.   Marland Kitchen ESRD (end stage renal disease)    Dialysis Tu, Th, Sa  . Essential hypertension   . Gastroesophageal reflux disease   . History of blood transfusion   .  Hyperlipidemia   . Junctional bradycardia   . Moderate mitral regurgitation   . Peptic ulcer   . Protein calorie malnutrition ([place])   . Sinus node dysfunction (HCC)   . Type 2 diabetes mellitus ([place])     Past Surgical History:  Procedure Laterality Date  . ABDOMINAL HYSTERECTOMY    . CARDIAC CATHETERIZATION N/A 09/17/2015   Procedure: Left Heart Cath and Coronary Angiography;  Surgeon: Jettie Booze, MD;  Location: [place] [facility];  Service: Cardiovascular;  Laterality: N/A;  . COLON SURGERY    . CORONARY ARTERY BYPASS GRAFT N/A 09/19/2015   Procedure: CORONARY ARTERY BYPASS GRAFTING (CABG) TIMES FOUR USING BILATARAL SAPHENOUS VEIN GRAFTS AND LEFT INTERNAL MAMMARY ARTERY;  Surgeon: [name], MD;  Location: [place];  Service: Open Heart Surgery;  Laterality: N/A;  . CORONARY STENT INTERVENTION N/A 01/10/2019   Procedure: CORONARY STENT INTERVENTION;  Surgeon: Leonie Man, MD;  Location: [place] [facility];  Service: Cardiovascular;  Laterality: N/A;  . LEFT HEART CATH AND CORS/GRAFTS ANGIOGRAPHY N/A 01/10/2019   Procedure: LEFT HEART CATH AND CORS/GRAFTS ANGIOGRAPHY;  Surgeon: Leonie Man, MD;  Location: [place] [facility];  Service: Cardiovascular;  Laterality: N/A;  . LEFT HEART CATH AND CORS/GRAFTS ANGIOGRAPHY N/A 02/11/2019   Procedure: LEFT HEART CATH AND CORS/GRAFTS ANGIOGRAPHY;  Surgeon: Burnell Blanks, MD;  Location: [place] [facility];  Service: Cardiovascular;  Laterality: N/A;  . TEE WITHOUT CARDIOVERSION N/A 09/19/2015   Procedure: TRANSESOPHAGEAL ECHOCARDIOGRAM (TEE);  Surgeon: [name], MD;  Location: [place];  Service: Open Heart Surgery;  Laterality: N/A;     Medications Prior to Admission: Prior to Admission medications   Medication Sig Start Date End Date Taking? Authorizing Provider  acetaminophen (TYLENOL) 500 MG tablet Take 1 tablet (500 mg total) by mouth every 6 (six) hours as needed for mild pain or headache. 02/12/19   Eugenie Filler, MD  albuterol (PROVENTIL HFA;VENTOLIN HFA) 108 (90 Base) MCG/ACT inhaler Inhale 2 puffs into the lungs every 6 (six) hours as needed for wheezing or shortness of breath.    [provider]  allopurinol (ZYLOPRIM) 100 MG tablet Take 100 mg by mouth 2 (two) times daily.     [provider]  aspirin EC 81 MG EC tablet Take 1 tablet (81 mg total) by mouth daily. 10/03/15   Barrett, Erin R, PA-C  atorvastatin (LIPITOR) 80 MG tablet Take 80 mg by mouth daily at 6 PM.  07/12/18   [provider]  brimonidine (ALPHAGAN P) 0.1 % SOLN Place 1 drop into both eyes 2 (two) times daily.    [provider]  Calcium Carbonate Antacid (CALCIUM CARBONATE, DOSED IN MG ELEMENTAL CALCIUM,) 1250 MG/5ML SUSP Take 5 mLs (500 mg of elemental calcium total) by mouth every 6 (six) hours as needed for indigestion. 06/22/19   Swayze, Ava, DO  camphor-menthol (SARNA) lotion Apply 1 application topically every 8 (eight) hours  as needed for itching. 06/22/19   Swayze, Ava, DO  carvedilol (COREG) 12.5 MG tablet Take 1 tablet (12.5 mg total) by mouth 2 (two) times daily. 03/04/19   Dorothy Spark, MD  Cholecalciferol (VITAMIN D-3) 1000 units CAPS Take 1,000 Units by mouth daily.     [provider]  clopidogrel (PLAVIX) 75 MG tablet Take 1 tablet (75 mg total) by mouth daily. 05/25/19   Dorothy Spark, MD  Cranberry 500 MG CAPS Take 1 capsule by mouth daily.    [provider]  diphenhydrAMINE (BENADRYL) 25 mg capsule Take 25 mg by mouth as needed.    [provider]  ferric citrate (AURYXIA) 1 GM 210 MG(Fe) tablet Take 210 mg by mouth 3 (three) times daily with meals.     [provider]  hydrALAZINE (APRESOLINE) 25 MG tablet Take 1 tablet (25 mg total) by mouth 2 (two) times daily. 04/07/19   Isaiah Serge, NP  Insulin Degludec (TRESIBA FLEXTOUCH [place]) Inject 20 Units into the skin daily.    [provider]  isosorbide mononitrate (IMDUR)  30 MG 24 hr tablet TAKE 1/2 TABLET(15 MG) BY MOUTH DAILY 07/05/19   Dorothy Spark, MD  isosorbide mononitrate (IMDUR) 60 MG 24 hr tablet Take 120 mg on Monday, Wednesday, Friday and Sunday.   Take 60 mg on (Tuesday, Thursday and Saturday)  Prior  to Dialysis and 60 mg After dialysis/ Patient taking differently: Take 60 mg by mouth 2 (two) times daily. On Tuesday, Thursday and Saturday, take both doses before dialysis. 03/19/19   Regalado, Belkys A, MD  latanoprost (XALATAN) 0.005 % ophthalmic solution Place 1 drop into both eyes at bedtime. 07/05/18   [provider]  loratadine (CLARITIN) 10 MG tablet Take 10 mg by mouth as needed for allergies.     [provider]  Melatonin 5 MG CAPS Take 5 mg by mouth at bedtime.     [provider]  multivitamin (RENA-VIT) TABS tablet Take 1 tablet by mouth daily.    [provider]  nitroGLYCERIN (NITROSTAT) 0.4 MG SL tablet Place 1 tablet (0.4 mg total) under the tongue every 5 (five) minutes as needed for chest pain. 03/18/19   Barrett, Evelene Croon, PA-C  pantoprazole (PROTONIX) 20 MG tablet Take 20 mg by mouth daily.    [provider]  Probiotic Product (PROBIOTIC PO) Take 1 tablet by mouth daily. 50 billion cfus    [provider]     Allergies:   No Known Allergies  Social History:   Social History   Socioeconomic History  . Marital status: Widowed    Spouse name: Not on file  . Number of children: Not on file  . Years of education: Not on file  . Highest education level: Not on file  Occupational History  . Occupation: Retired  Tobacco Use  . Smoking status: Never Smoker  . Smokeless tobacco: Former Systems developer    Types: Snuff  Substance and Sexual Activity  . Alcohol use: No    Alcohol/week: 0.0 standard drinks  . Drug use: No  . Sexual activity: Not on file  Other Topics Concern  . Not on file  Social History Narrative  . Not on file   Social Determinants of Health   Financial  Resource Strain: Low Risk   . Difficulty of Paying Living Expenses: Not hard at all  Food Insecurity: No Food Insecurity  . Worried About Charity fundraiser in the Last Year:  Never true  . Ran Out of Food in the Last Year: Never true  Transportation Needs: No Transportation Needs  . Lack of Transportation (Medical): No  . Lack of Transportation (Non-Medical): No  Physical Activity:   . Days of Exercise per Week: Not on file  . Minutes of Exercise per Session: Not on file  Stress:   . Feeling of Stress : Not on file  Social Connections: Somewhat Isolated  . Frequency of Communication with Friends and Family: More than three times a week  . Frequency of Social Gatherings with Friends and Family: More than three times a week  . Attends Religious Services: More than 4 times per year  . Active Member of Clubs or Organizations: No  . Attends Archivist Meetings: Never  . Marital Status: Widowed  Intimate Partner Violence:   . Fear of Current or Ex-Partner: Not on file  . Emotionally Abused: Not on file  . Physically Abused: Not on file  . Sexually Abused: Not on file    Family History:   The patient's family history includes Heart attack in her father.    ROS:  Please see the history of present illness.  All other ROS reviewed and negative.     Physical Exam/Data:  There were no vitals filed for this visit. No intake or output data in the 24 hours ending 07/11/19 1446 Last 3 Weights 06/27/2019 06/22/2019 06/21/2019  Weight (lbs) 159 lb 158 lb 8.2 oz 162 lb 0.6 oz  Weight (kg) 72.122 kg 71.9 kg 73.5 kg    Physical Exam not performed due to pending COVID status   There is no height or weight on file to calculate BMI.  General:  Well nourished, well developed, in no acute distress HEENT: normal Lymph: no adenopathy Neck: no JVD Endocrine:  No thryomegaly Vascular: No carotid bruits; FA pulses 2+ bilaterally without bruits  Cardiac:  normal S1, S2; RRR; no murmur    Lungs:  clear to auscultation bilaterally, no wheezing, rhonchi or rales  Abd: soft, nontender, no hepatomegaly  Ext: no edema Musculoskeletal:  No deformities, BUE and BLE strength normal and equal Skin: warm and dry  Neuro:  CNs 2-12 intact, no focal abnormalities noted Psych:  Normal affect    EKG:  The ECG that was done 07/11/19 was personally reviewed and demonstrates EKG shows T wave depression infero/lateral leads  Relevant CV Studies:  Cardiac cath 02/11/2019  Dist LM to Prox LAD lesion is 30% stenosed.  Mid LAD-1 lesion is 95% stenosed.  Mid LAD-2 lesion is 100% stenosed.  Ost Cx to Mid Cx lesion is 100% stenosed.  Prox RCA lesion is 70% stenosed.  Prox RCA to Mid RCA lesion is 90% stenosed.  Mid RCA lesion is 95% stenosed.  Dist RCA lesion is 100% stenosed with 100% stenosed side branch in RPAV.  Ramus lesion is 100% stenosed.  LIMA and is normal in caliber.  There is no competitive flow  SVG.  The graft exhibits severe diffuse disease.  Origin to Prox Graft lesion is 90% stenosed.  Prox Graft to Mid Graft lesion is 80% stenosed.  Dist Graft lesion is 100% stenosed.  SVG.  SVG.  Previously placed Origin to Prox Graft stent (unknown type) is widely patent.  Previously placed Dist Graft stent (unknown type) is widely patent.  1. Chronic occlusion of the LAD is known. The LIMA to the LAD is patent 2. Chronic occlusion of the Circumflex is known. The SVG to the ramus  intermediate is patent with patent proximal stent.  The SVG to the OM is patent with patent distal body stent.  3. Chronic occlusion mid RCA. The SVG to the RCA is known to be occluded (not selectively engaged today).   Recommendations: No focal targets for PCI. Continue medical management. Consider addition of Ranexa.   Coronary Diagrams  Diagnostic Dominance: Right    Echo 01/08/2019 1. The left ventricle has moderate-severely reduced systolic function, with an ejection  fraction of 30-35%. The cavity size was normal. There is moderate concentric left ventricular hypertrophy. Left ventricular diastolic Doppler parameters are  consistent with pseudonormalization. Elevated left atrial and left ventricular end-diastolic pressures. 2. The right ventricle has moderately reduced systolic function. The cavity was normal. There is no increase in right ventricular wall thickness. Right ventricular systolic pressure is normal. 3. Left atrial size was moderately dilated. 4. Right atrial size was mildly dilated. 5. The mitral valve is degenerative. Moderate thickening of the mitral valve leaflet. There is mild mitral annular calcification present. Mitral valve regurgitation is moderate by color flow Doppler. 6. The aortic valve is tricuspid. Mild thickening of the aortic valve. Aortic valve regurgitation is mild by color flow Doppler.   Laboratory Data:  High Sensitivity Troponin:   Recent Labs  Lab 06/21/19 0423 06/21/19 0744 06/21/19 1300 06/21/19 1403  TROPONINIHS 29* 275* 3,568* 8,971*      ChemistryNo results for input(s): NA, K, CL, CO2, GLUCOSE, BUN, CREATININE, CALCIUM, GFRNONAA, GFRAA, ANIONGAP in the last 168 hours.  No results for input(s): PROT, ALBUMIN, AST, ALT, ALKPHOS, BILITOT in the last 168 hours. HematologyNo results for input(s): WBC, RBC, HGB, HCT, MCV, MCH, MCHC, RDW, PLT in the last 168 hours. BNPNo results for input(s): BNP, PROBNP in the last 168 hours.  DDimer No results for input(s): DDIMER in the last 168 hours.   Radiology/Studies:  DG Chest Port 1 View  Result Date: 07/11/2019 CLINICAL DATA:  Chest pain. EXAM: PORTABLE CHEST 1 VIEW COMPARISON:  06/21/2019 FINDINGS: Stable cardiac enlargement and evidence of prior CABG. Stable mild interstitial prominence without evidence of overt edema, airspace consolidation, pleural fluid or pneumothorax. IMPRESSION: Stable cardiomegaly. Mild interstitial prominence without overt edema.  Electronically Signed   By: Aletta Edouard M.D.   On: 07/11/2019 13:52    :[id number] TIMI Risk Score for Unstable Angina or Non-ST Elevation MI:   The patient's TIMI risk score is 7, which indicates a 41% risk of all cause mortality, new or recurrent myocardial infarction or need for urgent revascularization in the next 14 days.   Assessment and Plan:   Unstable Angina Patient with h/o of CABG had NSTEMI in July 20202 with re-stenting of the SVG to OM. Repeat cath in august was stable. She has had multiple ED visits for CP related to HD and medical management was optimized. Her last admission was 12/15 and Dr. Tamala [name] recommended cath but patient refused. She now presents with chest pain EKG changes (EKG showed ST depression inferolateral leads). Patient denies missing doses of Aspirin and Plavix.  - Patient will likely need cardiac cath given chest pain and EKG changes. NPO for cath - Start heparin - continue Aspirin and plavix - Continue to trend troponin - No statin 2/2 elevated LFTs. Will re-check - MD to see  ESRD on HD - continue with TTS - Volume management per HD - Consult nephrology for HD  Chronic combined CHF - Last echo showed reduced EF 30-35% - No ACE/ARB/SPiro due to kidney function - Volume  management per HD  DM2 - SSI during admission  Severity of Illness: The appropriate patient status for this patient is INPATIENT. Inpatient status is judged to be reasonable and necessary in order to provide the required intensity of service to ensure the patient's safety. The patient's presenting symptoms, physical exam findings, and initial radiographic and laboratory data in the context of their chronic comorbidities is felt to place them at high risk for further clinical deterioration. Furthermore, it is not anticipated that the patient will be medically stable for discharge from the [name] within 2 midnights of admission. The following factors support the patient status of  inpatient.   " The patient's presenting symptoms include chest pain. " The worrisome physical exam findings include chest pain. " The initial radiographic and laboratory data are worrisome because of elevated troponin and EKG changes. " The chronic co-morbidities include ESRD and DMs.   * I certify that at the point of admission it is my clinical judgment that the patient will require inpatient [name] care spanning beyond 2 midnights from the point of admission due to high intensity of service, high risk for further deterioration and high frequency of surveillance required.*    For questions or updates, please contact [place] Please consult [email or web address] for contact info under     Signed, Deondrick Searls Ninfa Meeker, PA-C  07/11/2019 2:46 PM  As above, patient seen and examined.  Briefly she is an 86 year old female with past medical history of coronary artery disease status post coronary artery bypass and graft, chronic combined congestive heart failure, end-stage renal disease dialysis dependent, diabetes mellitus with unstable angina/ACS.  Patient had PCI of saphenous vein graft to obtuse marginal and PCI of saphenous vein graft to ramus intermedius 7/20.  Patient ruled in for non-ST elevation myocardial infarction in December and also had Covid positive test December 15.  Cardiac catheterization was discussed but patient preferred medical therapy at that time.  Patient developed substernal chest pain described as a sharp sensation this morning.  There was no radiation.  There was diaphoresis but no nausea or dyspnea.  Pain persisted and she presented to the emergency room.  She was given 1 nitroglycerin with some improvement in pain but her pain persists at present.  Initial electrocardiogram showed marked ST depression in the inferolateral leads. FU ECG showed junctional rhythm, left axis deviation, improvement in ST depression.  Hemoglobin 15.1.  Troponin I is pending.  1 unstable angina/acute  coronary syndrome-patient presents with identical symptoms to her previous cardiac pain.  There was some improvement with nitroglycerin but symptoms persist.  She had marked ST changes on her initial electrocardiogram.  Systolic blood pressure decreased to 80 in the emergency room limiting options for medical therapy such as nitroglycerin.  Given persistent symptoms I feel we should proceed with urgent cardiac catheterization.  The risks and benefits including myocardial infarction, CVA and death discussed and she agrees to proceed.  We will treat with aspirin, Plavix, IV heparin and continue Lipitor 80 mg daily.  Given junctional rhythm will hold on carvedilol for now but resume pending follow-up heart rate.  Cycle enzymes.  2 end-stage renal disease-we will notify nephrology as she will require dialysis tomorrow.  3 junctional bradycardia-noted on follow-up ECG.  Hold Coreg for now and follow.  4 ischemic cardiomyopathy-blood pressure is borderline.  Hold hydralazine/nitrates and carvedilol.  Can reinitiate pending follow-up blood pressure.  Repeat echocardiogram in the setting of acute coronary syndrome.  5 hyperlipidemia-continue statin.  6 recent Covid positive  test-December 15.   Kirk Ruths, MD

## 2019-07-11 NOTE — Patient Outreach (Signed)
[facility]) Care Management  07/11/2019  Monica [name] 12/22/1930 [id number]   Transition of Care     Outreach attempt #1 to patient. No answer at present.     Plan: RN CM will make outreach attempt to patient within 3-4 business days.  Enzo Montgomery, RN,BSN,CCM [place] Management Telephonic Care Management Coordinator Direct Phone: [phone] Toll Free: [phone] Fax: [phone]

## 2019-07-11 NOTE — Progress Notes (Addendum)
ANTICOAGULATION CONSULT NOTE - Initial Consult  Pharmacy Consult for Heparin  Indication: chest pain/ACS  No Known Allergies  Patient Measurements: Height: 5\' 2"  (157.5 cm) Weight: 160 lb (72.6 kg) IBW/kg (Calculated) : 50.1 Heparin Dosing Weight: 65.3  Vital Signs: BP: 112/78 (01/04 1454)  Labs: Recent Labs    07/11/19 1411  HGB 15.1*  HCT 51.0*  PLT 231  APTT 33  LABPROT 13.4  INR 1.0    Estimated Creatinine Clearance: 6.1 mL/min (A) (by C-G formula based on SCr of 6.2 mg/dL (H)).   Medical History: Past Medical History:  Diagnosis Date  . Anemia   . Asthma   . Chronic combined systolic and diastolic CHF (congestive heart failure) ([place])    a. Echo 11/19 [facility]):  EF 55-65, normal wall motion, normal diastolic function. b. Echo 01/2019 EF 30-35%, moderate MR, mild AI.  Marland Kitchen Coronary artery disease    a. s/p CABG 2017. b.s/p NSTEMI 11/19 [facility]) >> S-PDA 100 >>PCI: 2.25 x 24 mm Synergy DES to S-OM. c. NSTEMI 01/2019 - occluded SVG-distal RCA, severe proximal to stent/ISR of SVG-OM s/p overlapping DES, DES to ostial prox SVG to the RI.   Marland Kitchen ESRD (end stage renal disease)    Dialysis Tu, Th, Sa  . Essential hypertension   . Gastroesophageal reflux disease   . History of blood transfusion   . Hyperlipidemia   . Junctional bradycardia   . Moderate mitral regurgitation   . Peptic ulcer   . Protein calorie malnutrition ([place])   . Sinus node dysfunction (HCC)   . Type 2 diabetes mellitus (HCC)     Assessment: 86 y.o. Female presenting with chest pain and history of NSTEMI (July 2020). Prior to admission patient was on aspirin and Plavix. Pharmacy consulted for heparin.  Goal of Therapy:  Heparin level 0.3-0.7 units/ml Monitor platelets by anticoagulation protocol: Yes   Plan:  Give heparin bolus of 4000 units; then start heparin gtt at 800 units/hr Monitor 8hr heparin level  Monitor daily heparin level, CBC, and sign/symptoms of bleeding  Acey Lav, PharmD  PGY1 [place] Resident [phone] 07/11/2019,2:59 PM

## 2019-07-11 NOTE — Progress Notes (Signed)
Rt groin dressing saturated; removed and changed dressing. Site level 0.

## 2019-07-11 NOTE — Progress Notes (Signed)
Site area: rt groin fa and fv sheaths pulled by Sherlyn Lick Site Prior to Removal:  Level 0 Pressure Applied For:  25 minutes Manual:   yes Patient Status During Pull:  stable Post Pull Site:  Level 0 Post Pull Instructions Given:  yes Post Pull Pulses Present:  Rt AT dopplered Dressing Applied:  Gauze and tegaderm Bedrest begins @ 2110 Comments: IV saline locked

## 2019-07-11 NOTE — ED Provider Notes (Signed)
[facility] EMERGENCY DEPARTMENT Provider Note   CSN: [id number] Arrival date & time: 07/11/19  1246     History Chief Complaint  Patient presents with  . Chest Pain    Monica [name] is a 86 y.o. female.  The history is provided by the patient and medical records. No language interpreter was used.  Chest Pain Pain location:  L chest and substernal area Pain quality: crushing and pressure   Pain severity:  Severe Onset quality:  Gradual Duration:  1 day Timing:  Constant Progression:  Improving Chronicity:  New Relieved by:  Aspirin and nitroglycerin Worsened by:  Nothing Ineffective treatments:  None tried Associated symptoms: diaphoresis, fatigue, nausea and shortness of breath   Associated symptoms: no abdominal pain, no altered mental status, no back pain, no cough, no fever, no palpitations and no vomiting   Risk factors: coronary artery disease   Risk factors: not female        Past Medical History:  Diagnosis Date  . Anemia   . Asthma   . Chronic combined systolic and diastolic CHF (congestive heart failure) ([place])    a. Echo 11/19 [facility]):  EF 55-65, normal wall motion, normal diastolic function. b. Echo 01/2019 EF 30-35%, moderate MR, mild AI.  Marland Kitchen Coronary artery disease    a. s/p CABG 2017. b.s/p NSTEMI 11/19 [facility]) >> S-PDA 100 >>PCI: 2.25 x 24 mm Synergy DES to S-OM. c. NSTEMI 01/2019 - occluded SVG-distal RCA, severe proximal to stent/ISR of SVG-OM s/p overlapping DES, DES to ostial prox SVG to the RI.   Marland Kitchen ESRD (end stage renal disease)    Dialysis Tu, Th, Sa  . Essential hypertension   . Gastroesophageal reflux disease   . History of blood transfusion   . Hyperlipidemia   . Junctional bradycardia   . Moderate mitral regurgitation   . Peptic ulcer   . Protein calorie malnutrition ([place])   . Sinus node dysfunction (HCC)   . Type 2 diabetes mellitus [facility])     Patient Active Problem List   Diagnosis Date Noted  . NSTEMI  (non-ST elevated myocardial infarction) ([place]) 06/21/2019  . Acute on chronic combined systolic and diastolic CHF (congestive heart failure) ([place]) 03/18/2019  . Anemia of chronic disease 03/17/2019  . ESRD on hemodialysis ([place]) 03/17/2019  . Sinus node dysfunction ([place]) 01/25/2019  . Ischemic cardiomyopathy 01/25/2019  . Hyperkalemia 01/18/2019  . Acute lower UTI 01/18/2019  . Elevated troponin   . Junctional bradycardia   . Bradycardia 01/17/2019  . S/P coronary artery stent placement   . Acute combined systolic and diastolic heart failure ([place])   . Non-ST elevation (NSTEMI) myocardial infarction ([place]) 01/08/2019  . Acute respiratory failure ([place]) 01/07/2019  . Diabetes mellitus with peripheral circulatory disorder ([place]) 02/05/2018  . Altered mental status 03/13/2017  . Claudication in peripheral vascular disease ([place]) 12/29/2016  . Abnormal weight loss 06/05/2016  . Bilateral pleural effusion 11/23/2015  . Chronic combined systolic and diastolic CHF (congestive heart failure) ([place]) 11/23/2015  . Dyspnea 11/23/2015  . Pleural effusion 11/23/2015  . CAD (coronary artery disease) 10/05/2015  . S/P CABG x 4 09/19/2015  . History of coronary artery bypass surgery 09/19/2015  . Prolonged Q-T interval on ECG 09/17/2015  . Angina pectoris ([place])   . Gastroesophageal reflux disease 09/15/2015  . Gout 09/15/2015  . Hyperlipidemia   . Diabetes mellitus with renal complications ([place])   . Obesity (BMI 30.0-34.9) 08/02/2015  . Asthma 04/27/2015  .  Vitamin D deficiency 04/27/2015  . Hyperlipidemia associated with type 2 diabetes mellitus ([place]) 04/27/2015  . Essential hypertension 04/24/2015  . ESRD (end stage renal disease) ([place]) 04/24/2015  . Type 2 diabetes mellitus with hyperglycemia ([place]) 04/24/2015  . Metatarsalgia of both feet 07/25/2014  . Equinus deformity of foot, acquired 07/25/2014  . Onychomycosis 07/25/2014  . Pain in lower limb 07/25/2014  . Acquired equinus deformity of  foot 07/25/2014  . Metatarsalgia 07/25/2014  . Pain of lower extremity 07/25/2014    Past Surgical History:  Procedure Laterality Date  . ABDOMINAL HYSTERECTOMY    . CARDIAC CATHETERIZATION N/A 09/17/2015   Procedure: Left Heart Cath and Coronary Angiography;  Surgeon: Jettie Booze, MD;  Location: [facility];  Service: Cardiovascular;  Laterality: N/A;  . COLON SURGERY    . CORONARY ARTERY BYPASS GRAFT N/A 09/19/2015   Procedure: CORONARY ARTERY BYPASS GRAFTING (CABG) TIMES FOUR USING BILATARAL SAPHENOUS VEIN GRAFTS AND LEFT INTERNAL MAMMARY ARTERY;  Surgeon: [name], MD;  Location: [facility];  Service: Open Heart Surgery;  Laterality: N/A;  . CORONARY STENT INTERVENTION N/A 01/10/2019   Procedure: CORONARY STENT INTERVENTION;  Surgeon: Leonie Man, MD;  Location: [facility];  Service: Cardiovascular;  Laterality: N/A;  . LEFT HEART CATH AND CORS/GRAFTS ANGIOGRAPHY N/A 01/10/2019   Procedure: LEFT HEART CATH AND CORS/GRAFTS ANGIOGRAPHY;  Surgeon: Leonie Man, MD;  Location: [facility];  Service: Cardiovascular;  Laterality: N/A;  . LEFT HEART CATH AND CORS/GRAFTS ANGIOGRAPHY N/A 02/11/2019   Procedure: LEFT HEART CATH AND CORS/GRAFTS ANGIOGRAPHY;  Surgeon: Burnell Blanks, MD;  Location: [facility];  Service: Cardiovascular;  Laterality: N/A;  . TEE WITHOUT CARDIOVERSION N/A 09/19/2015   Procedure: TRANSESOPHAGEAL ECHOCARDIOGRAM (TEE);  Surgeon: [name], MD;  Location: [facility];  Service: Open Heart Surgery;  Laterality: N/A;     OB History   No obstetric history on file.     Family History  Problem Relation Age of Onset  . Heart attack Father     Social History   Tobacco Use  . Smoking status: Never Smoker  . Smokeless tobacco: Former Systems developer    Types: Snuff  Substance Use Topics  . Alcohol use: No    Alcohol/week: 0.0 standard drinks  . Drug use: No    Home Medications Prior to Admission medications     Medication Sig Start Date End Date Taking? Authorizing Provider  acetaminophen (TYLENOL) 500 MG tablet Take 1 tablet (500 mg total) by mouth every 6 (six) hours as needed for mild pain or headache. 02/12/19   Eugenie Filler, MD  albuterol (PROVENTIL HFA;VENTOLIN HFA) 108 (90 Base) MCG/ACT inhaler Inhale 2 puffs into the lungs every 6 (six) hours as needed for wheezing or shortness of breath.    [provider]  allopurinol (ZYLOPRIM) 100 MG tablet Take 100 mg by mouth 2 (two) times daily.     [provider]  aspirin EC 81 MG EC tablet Take 1 tablet (81 mg total) by mouth daily. 10/03/15   Barrett, Erin R, PA-C  atorvastatin (LIPITOR) 80 MG tablet Take 80 mg by mouth daily at 6 PM.  07/12/18   [provider]  brimonidine (ALPHAGAN P) 0.1 % SOLN Place 1 drop into both eyes 2 (two) times daily.    [provider]  Calcium Carbonate Antacid (CALCIUM CARBONATE, DOSED IN MG ELEMENTAL CALCIUM,) 1250 MG/5ML SUSP Take 5 mLs (500 mg of elemental calcium  total) by mouth every 6 (six) hours as needed for indigestion. 06/22/19   Swayze, Ava, DO  camphor-menthol (SARNA) lotion Apply 1 application topically every 8 (eight) hours as needed for itching. 06/22/19   Swayze, Ava, DO  carvedilol (COREG) 12.5 MG tablet Take 1 tablet (12.5 mg total) by mouth 2 (two) times daily. 03/04/19   Dorothy Spark, MD  Cholecalciferol (VITAMIN D-3) 1000 units CAPS Take 1,000 Units by mouth daily.     [provider]  clopidogrel (PLAVIX) 75 MG tablet Take 1 tablet (75 mg total) by mouth daily. 05/25/19   Dorothy Spark, MD  Cranberry 500 MG CAPS Take 1 capsule by mouth daily.    [provider]  diphenhydrAMINE (BENADRYL) 25 mg capsule Take 25 mg by mouth as needed.    [provider]  ferric citrate (AURYXIA) 1 GM 210 MG(Fe) tablet Take 210 mg by mouth 3 (three) times daily with meals.     [provider]  hydrALAZINE (APRESOLINE) 25 MG tablet Take 1  tablet (25 mg total) by mouth 2 (two) times daily. 04/07/19   Isaiah Serge, NP  Insulin Degludec (TRESIBA FLEXTOUCH [place]) Inject 20 Units into the skin daily.    [provider]  isosorbide mononitrate (IMDUR) 30 MG 24 hr tablet TAKE 1/2 TABLET(15 MG) BY MOUTH DAILY 07/05/19   Dorothy Spark, MD  isosorbide mononitrate (IMDUR) 60 MG 24 hr tablet Take 120 mg on Monday, Wednesday, Friday and Sunday.   Take 60 mg on (Tuesday, Thursday and Saturday)  Prior  to Dialysis and 60 mg After dialysis/ Patient taking differently: Take 60 mg by mouth 2 (two) times daily. On Tuesday, Thursday and Saturday, take both doses before dialysis. 03/19/19   Regalado, Belkys A, MD  latanoprost (XALATAN) 0.005 % ophthalmic solution Place 1 drop into both eyes at bedtime. 07/05/18   [provider]  loratadine (CLARITIN) 10 MG tablet Take 10 mg by mouth as needed for allergies.     [provider]  Melatonin 5 MG CAPS Take 5 mg by mouth at bedtime.     [provider]  multivitamin (RENA-VIT) TABS tablet Take 1 tablet by mouth daily.    [provider]  nitroGLYCERIN (NITROSTAT) 0.4 MG SL tablet Place 1 tablet (0.4 mg total) under the tongue every 5 (five) minutes as needed for chest pain. 03/18/19   Barrett, Evelene Croon, PA-C  pantoprazole (PROTONIX) 20 MG tablet Take 20 mg by mouth daily.    [provider]  Probiotic Product (PROBIOTIC PO) Take 1 tablet by mouth daily. 50 billion cfus    [provider]    Allergies    Patient has no known allergies.  Review of Systems   Review of Systems  Constitutional: Positive for diaphoresis and fatigue. Negative for fever.  HENT: Negative for congestion.   Respiratory: Positive for chest tightness and shortness of breath. Negative for cough and wheezing.   Cardiovascular: Positive for chest pain. Negative for palpitations.  Gastrointestinal: Positive for nausea. Negative for abdominal pain, constipation, diarrhea  and vomiting.  Genitourinary: Negative for flank pain.  Musculoskeletal: Negative for back pain.  Psychiatric/Behavioral: Negative for agitation.  All other systems reviewed and are negative.   Physical Exam Updated Vital Signs BP 112/78   Resp 17   Ht 5\' 2"  (1.575 m)   Wt 72.6 kg   SpO2 100%   BMI 29.26 kg/m   Physical Exam Vitals and nursing note reviewed.  Constitutional:  General: She is not in acute distress.    Appearance: She is well-developed. She is ill-appearing and diaphoretic. She is not toxic-appearing.  HENT:     Head: Normocephalic and atraumatic.     Right Ear: External ear normal.     Left Ear: External ear normal.     Nose: Nose normal.     Mouth/Throat:     Pharynx: No oropharyngeal exudate.  Eyes:     Conjunctiva/sclera: Conjunctivae normal.     Pupils: Pupils are equal, round, and reactive to light.  Cardiovascular:     Rate and Rhythm: Normal rate.     Heart sounds: Murmur present.  Pulmonary:     Effort: No tachypnea or respiratory distress.     Breath sounds: No stridor. No decreased breath sounds, wheezing, rhonchi or rales.  Abdominal:     General: There is no distension.     Tenderness: There is no abdominal tenderness. There is no rebound.  Musculoskeletal:     Cervical back: Normal range of motion and neck supple.  Skin:    General: Skin is warm.     Findings: No erythema or rash.  Neurological:     General: No focal deficit present.     Mental Status: She is alert and oriented to person, place, and time.     Motor: No abnormal muscle tone.     Coordination: Coordination normal.     Deep Tendon Reflexes: Reflexes are normal and symmetric.     ED Results / Procedures / Treatments   Labs (all labs ordered are listed, but only abnormal results are displayed) Labs Reviewed  RESPIRATORY PANEL BY RT PCR (FLU A&B, COVID) - Abnormal; Notable for the following components:      Result Value   SARS Coronavirus 2 by RT PCR POSITIVE (*)     All other components within normal limits  CBC WITH DIFFERENTIAL/PLATELET - Abnormal; Notable for the following components:   Hemoglobin 15.1 (*)    HCT 51.0 (*)    MCV 101.4 (*)    MCHC 29.6 (*)    RDW 18.7 (*)    All other components within normal limits  COMPREHENSIVE METABOLIC PANEL - Abnormal; Notable for the following components:   Potassium 6.9 (*)    Glucose, Bld 120 (*)    BUN 33 (*)    Creatinine, Ser 6.85 (*)    Albumin 3.4 (*)    GFR calc non Af Amer 5 (*)    GFR calc Af Amer 6 (*)    All other components within normal limits  CBG MONITORING, ED - Abnormal; Notable for the following components:   Glucose-Capillary 68 (*)    All other components within normal limits  CBG MONITORING, ED - Abnormal; Notable for the following components:   Glucose-Capillary 59 (*)    All other components within normal limits  TROPONIN I (HIGH SENSITIVITY) - Abnormal; Notable for the following components:   Troponin I (High Sensitivity) 301 (*)    All other components within normal limits  PROTIME-INR  APTT  LIPID PANEL  HEPARIN LEVEL (UNFRACTIONATED)  POTASSIUM  TROPONIN I (HIGH SENSITIVITY)    EKG EKG Interpretation  Date/Time:  Monday July 11 2019 12:59:01 EST Ventricular Rate:  92 PR Interval:    QRS Duration: 124 QT Interval:  382 QTC Calculation: 472 R Axis:   -64 Text Interpretation: Sinus rhythm Left axis deviation Minimal voltage criteria for LVH, may be normal variant ( Cornell product ) Possible Lateral infarct ,  age undetermined Abnormal ECG ECG concerning for ischemia. Similar to ECG in Jun 21 2019. Confirmed by Antony Blackbird [phone]) on 07/11/2019 1:44:51 PM   Radiology DG Chest Port 1 View  Result Date: 07/11/2019 CLINICAL DATA:  Chest pain. EXAM: PORTABLE CHEST 1 VIEW COMPARISON:  06/21/2019 FINDINGS: Stable cardiac enlargement and evidence of prior CABG. Stable mild interstitial prominence without evidence of overt edema, airspace consolidation, pleural  fluid or pneumothorax. IMPRESSION: Stable cardiomegaly. Mild interstitial prominence without overt edema. Electronically Signed   By: Aletta Edouard M.D.   On: 07/11/2019 13:52    Procedures Procedures (including critical care time)  Monica [name] was evaluated in Emergency Department on 07/11/2019 for the symptoms described in the history of present illness. She was evaluated in the context of the global COVID-19 pandemic, which necessitated consideration that the patient might be at risk for infection with the SARS-CoV-2 virus that causes COVID-19. Institutional protocols and algorithms that pertain to the evaluation of patients at risk for COVID-19 are in a state of rapid change based on information released by regulatory bodies including the CDC and federal and state organizations. These policies and algorithms were followed during the patient's care in the ED.  CRITICAL CARE Performed by: Gwenyth Allegra Parilee Hally Total critical care time: 35 minutes Critical care time was exclusive of separately billable procedures and treating other patients. Critical care was necessary to treat or prevent imminent or life-threatening deterioration. Critical care was time spent personally by me on the following activities: development of treatment plan with patient and/or surrogate as well as nursing, discussions with consultants, evaluation of patient's response to treatment, examination of patient, obtaining history from patient or surrogate, ordering and performing treatments and interventions, ordering and review of laboratory studies, ordering and review of radiographic studies, pulse oximetry and re-evaluation of patient's condition.   Medications Ordered in ED Medications  0.9 %  sodium chloride infusion (has no administration in time range)  sodium chloride flush (NS) 0.9 % injection 3 mL ( Intravenous MAR Hold 07/11/19 1528)  heparin ADULT infusion 100 units/mL (25000 units/229mL sodium chloride 0.45%) (0  Units/hr Intravenous Stopping Infusion hung by another clincian 07/11/19 1552)  insulin aspart (novoLOG) injection 0-15 Units ( Subcutaneous Automatically Held 07/19/19 1700)  insulin aspart (novoLOG) injection 0-5 Units ( Subcutaneous Automatically Held 07/19/19 2200)  lidocaine (PF) (XYLOCAINE) 1 % injection (20 mLs  Given 07/11/19 1554)  bivalirudin (ANGIOMAX) BOLUS via infusion (54.45 mg Intravenous Given 07/11/19 1623)  bivalirudin (ANGIOMAX) 250 mg in sodium chloride 0.9 % 50 mL (5 mg/mL) infusion (0 mg/kg/hr  72.6 kg Intravenous Stopped 07/11/19 1642)  nitroGLYCERIN 1 mg/10 mL (100 mcg/mL) - IR/CATH LAB (200 mcg Intracoronary Given 07/11/19 1641)  Heparin (Porcine) in NaCl 1000-0.9 UT/500ML-% SOLN (500 mLs  Given 07/11/19 1644)  iohexol (OMNIPAQUE) 350 MG/ML injection (105 mLs  Given 07/11/19 1646)  clopidogrel (PLAVIX) tablet (600 mg Oral Given 07/11/19 1648)  aspirin chewable tablet 324 mg (324 mg Oral Given by EMS 07/11/19 1416)  heparin bolus via infusion 4,000 Units (4,000 Units Intravenous Bolus from Bag 07/11/19 1512)    ED Course  I have reviewed the triage vital signs and the nursing notes.  Pertinent labs & imaging results that were available during my care of the patient were reviewed by me and considered in my medical decision making (see chart for details).    MDM Rules/Calculators/A&P  Kaelin Kawano is a 86 y.o. female with a past medical history significant for CAD status post CABG and recent NSTEMI, recent COVID-19 infection, hypertension, diabetes, CHF, ESRD, asthma, GERD, and anemia who presents for chest pain.  She reports that she was admitted several weeks ago for similar chest pain and was discharged home.  She says that she was doing well and has not missed dialysis recently.  She says that this morning she woke up with severe chest pain that feels like a pressure in her left central chest.  She reports it feels similar to prior MI.  She had associated diaphoresis,  nausea, and shortness of breath.  She denies any trauma.  She denies syncope.  She denies new leg pain or leg swelling.  She denies any fevers or chills and reports her Covid symptoms of cough have improved.  She received aspirin and nitro with EMS with improvement in symptoms.  On exam, patient is diaphoretic but is now resting.  Her breath sounds were clear bilaterally.  Slight murmur.  Good pulses in upper extremities.  Legs had some mild edema.  Patient did not have hypoxia or hypotension on my exam.  She was nontachycardic.  Patient had EKG performed in triage that another provider saw and was also concerned.  Code STEMI was activated at that time.  I saw the patient and called the STEMI team who will come see the patient.  Rapid Covid was ordered although she was recently positive so anticipated will be positive.  Patient will be admitted to cardiology for further management.  Cardiology came to the bedside and will take the patient to the Cath Lab for further management.     Final Clinical Impression(s) / ED Diagnoses Final diagnoses:  Non-ST elevation (NSTEMI) myocardial infarction ([place])  COVID-19     Clinical Impression: 1. Non-ST elevation (NSTEMI) myocardial infarction ([place])   2. COVID-19     Disposition: Admit  This note was prepared with assistance of Dragon voice recognition software. Occasional wrong-word or sound-a-like substitutions may have occurred due to the inherent limitations of voice recognition software.      Ernestine Langworthy, Gwenyth Allegra, MD 07/11/19 [phone]

## 2019-07-11 NOTE — Progress Notes (Signed)
VAST consulted to place PIV access. Upon arrival at bedside, pt's nurse notified writer that physician was able to place [place].

## 2019-07-11 NOTE — Consult Note (Addendum)
[facility] Renal Consultation Note  Indication for Consultation:  Management of ESRD/hemodialysis; anemia, hypertension/volume and secondary hyperparathyroidism  HPI: Monica [name] is a 86 y.o. female  with a PMH significant for ESRD on HD TTS (At [facility] [place] last hd yet  07/10/19 on schedule  ), HTN, HLD, DM, CAD s/p CABG, CAD s/p mult PCIs who is now seen in consultation  for management of ESRD and provision of HD.   Noted patient was COVID  Positive 06/21/19 and  Pending  Test today .  History obtained  From chart  And info her out pt  [facility] Kitchen  Patient admitted with Unstable angina/ Troponin  301  ,She came to  ED 07/11/19 for chest painthat began this morning at 9 AM  woke the patient up out of sleep. It was a sharp substernal pain, 6/10 and non-radiating. Associated SOB and diaphoresis. No nausea or vomiting. Patient EMS was called and administered 1 NTG which completely relieved the pain. By the time the patient arrived she was chest pain free. In the ED labs showed potassium 4.8, glucose 210, creatinine 6.20, calcium 8.8. WBC 8.5 Hgb 12.3,  EKG shows T wave depression/ infero/lateral leads. CXR shows stable cardiomegaly with mild interstitial prominence without overt edema.         Past Medical History:  Diagnosis Date  . Anemia   . Asthma   . Chronic combined systolic and diastolic CHF (congestive heart failure) ([place])    a. Echo 11/19 [facility]):  EF 55-65, normal wall motion, normal diastolic function. b. Echo 01/2019 EF 30-35%, moderate MR, mild AI.  Marland Kitchen Coronary artery disease    a. s/p CABG 2017. b.s/p NSTEMI 11/19 [facility]) >> S-PDA 100 >>PCI: 2.25 x 24 mm Synergy DES to S-OM. c. NSTEMI 01/2019 - occluded SVG-distal RCA, severe proximal to stent/ISR of SVG-OM s/p overlapping DES, DES to ostial prox SVG to the RI.   Marland Kitchen ESRD (end stage renal disease)    Dialysis Tu, Th, Sa  . Essential hypertension   . Gastroesophageal reflux disease   .  History of blood transfusion   . Hyperlipidemia   . Junctional bradycardia   . Moderate mitral regurgitation   . Peptic ulcer   . Protein calorie malnutrition ([place])   . Sinus node dysfunction ([facility])   . Type 2 diabetes mellitus ([place])     Past Surgical History:  Procedure Laterality Date  . ABDOMINAL HYSTERECTOMY    . CARDIAC CATHETERIZATION N/A 09/17/2015   Procedure: Left Heart Cath and Coronary Angiography;  Surgeon: Jettie Booze, MD;  Location: [place] [facility];  Service: Cardiovascular;  Laterality: N/A;  . COLON SURGERY    . CORONARY ARTERY BYPASS GRAFT N/A 09/19/2015   Procedure: CORONARY ARTERY BYPASS GRAFTING (CABG) TIMES FOUR USING BILATARAL SAPHENOUS VEIN GRAFTS AND LEFT INTERNAL MAMMARY ARTERY;  Surgeon: [name], MD;  Location: [place];  Service: Open Heart Surgery;  Laterality: N/A;  . CORONARY STENT INTERVENTION N/A 01/10/2019   Procedure: CORONARY STENT INTERVENTION;  Surgeon: Leonie Man, MD;  Location: [place] [facility];  Service: Cardiovascular;  Laterality: N/A;  . LEFT HEART CATH AND CORS/GRAFTS ANGIOGRAPHY N/A 01/10/2019   Procedure: LEFT HEART CATH AND CORS/GRAFTS ANGIOGRAPHY;  Surgeon: Leonie Man, MD;  Location: [place] [facility];  Service: Cardiovascular;  Laterality: N/A;  . LEFT HEART CATH AND CORS/GRAFTS ANGIOGRAPHY N/A 02/11/2019   Procedure: LEFT HEART CATH AND CORS/GRAFTS ANGIOGRAPHY;  Surgeon: Burnell Blanks, MD;  Location: [place] [facility];  Service: Cardiovascular;  Laterality: N/A;  . TEE WITHOUT CARDIOVERSION N/A 09/19/2015   Procedure: TRANSESOPHAGEAL ECHOCARDIOGRAM (TEE);  Surgeon: [name], MD;  Location: [place];  Service: Open Heart Surgery;  Laterality: N/A;      Family History  Problem Relation Age of Onset  . Heart attack Father       reports that she has never smoked. She has quit using smokeless tobacco.  Her smokeless tobacco use included snuff. She reports that she does not drink alcohol or use  drugs.  No Known Allergies  Prior to Admission medications   Medication Sig Start Date End Date Taking? Authorizing Provider  acetaminophen (TYLENOL) 500 MG tablet Take 1 tablet (500 mg total) by mouth every 6 (six) hours as needed for mild pain or headache. 02/12/19   Eugenie Filler, MD  albuterol (PROVENTIL HFA;VENTOLIN HFA) 108 (90 Base) MCG/ACT inhaler Inhale 2 puffs into the lungs every 6 (six) hours as needed for wheezing or shortness of breath.    [provider]  allopurinol (ZYLOPRIM) 100 MG tablet Take 100 mg by mouth 2 (two) times daily.     [provider]  aspirin EC 81 MG EC tablet Take 1 tablet (81 mg total) by mouth daily. 10/03/15   Barrett, Erin R, PA-C  atorvastatin (LIPITOR) 80 MG tablet Take 80 mg by mouth daily at 6 PM.  07/12/18   [provider]  brimonidine (ALPHAGAN P) 0.1 % SOLN Place 1 drop into both eyes 2 (two) times daily.    [provider]  Calcium Carbonate Antacid (CALCIUM CARBONATE, DOSED IN MG ELEMENTAL CALCIUM,) 1250 MG/5ML SUSP Take 5 mLs (500 mg of elemental calcium total) by mouth every 6 (six) hours as needed for indigestion. 06/22/19   Swayze, Ava, DO  camphor-menthol (SARNA) lotion Apply 1 application topically every 8 (eight) hours as needed for itching. 06/22/19   Swayze, Ava, DO  carvedilol (COREG) 12.5 MG tablet Take 1 tablet (12.5 mg total) by mouth 2 (two) times daily. 03/04/19   Dorothy Spark, MD  Cholecalciferol (VITAMIN D-3) 1000 units CAPS Take 1,000 Units by mouth daily.     [provider]  clopidogrel (PLAVIX) 75 MG tablet Take 1 tablet (75 mg total) by mouth daily. 05/25/19   Dorothy Spark, MD  Cranberry 500 MG CAPS Take 1 capsule by mouth daily.    [provider]  diphenhydrAMINE (BENADRYL) 25 mg capsule Take 25 mg by mouth as needed.    [provider]  ferric citrate (AURYXIA) 1 GM 210 MG(Fe) tablet Take 210 mg by mouth 3 (three) times daily with meals.     [provider]  hydrALAZINE (APRESOLINE) 25 MG tablet Take 1 tablet (25 mg total) by mouth 2 (two) times daily. 04/07/19   Isaiah Serge, NP  Insulin Degludec (TRESIBA FLEXTOUCH [place]) Inject 20 Units into the skin daily.    [provider]  isosorbide mononitrate (IMDUR) 30 MG 24 hr tablet TAKE 1/2 TABLET(15 MG) BY MOUTH DAILY 07/05/19   Dorothy Spark, MD  isosorbide mononitrate (IMDUR) 60 MG 24 hr tablet Take 120 mg on Monday, Wednesday, Friday and Sunday.   Take 60 mg on (Tuesday, Thursday and Saturday)  Prior  to Dialysis and 60 mg After dialysis/ Patient taking differently: Take 60 mg by mouth 2 (two) times daily. On Tuesday, Thursday and Saturday, take both doses before dialysis. 03/19/19  Regalado, Belkys A, MD  latanoprost (XALATAN) 0.005 % ophthalmic solution Place 1 drop into both eyes at bedtime. 07/05/18   [provider]  loratadine (CLARITIN) 10 MG tablet Take 10 mg by mouth as needed for allergies.     [provider]  Melatonin 5 MG CAPS Take 5 mg by mouth at bedtime.     [provider]  multivitamin (RENA-VIT) TABS tablet Take 1 tablet by mouth daily.    [provider]  nitroGLYCERIN (NITROSTAT) 0.4 MG SL tablet Place 1 tablet (0.4 mg total) under the tongue every 5 (five) minutes as needed for chest pain. 03/18/19   Barrett, Evelene Croon, PA-C  pantoprazole (PROTONIX) 20 MG tablet Take 20 mg by mouth daily.    [provider]  Probiotic Product (PROBIOTIC PO) Take 1 tablet by mouth daily. 50 billion cfus    [provider]    [id number] (ANGIOMAX) infusion 5 mg/mL, bivalirudin, lidocaine (PF)  Results for orders placed or performed during the hospital encounter of 07/11/19 (from the past 48 hour(s))  CBG monitoring, ED     Status: Abnormal   Collection Time: 07/11/19  1:05 PM  Result Value Ref Range   Glucose-Capillary 68 (L) 70 - 99 mg/dL  CBG monitoring, ED     Status: Abnormal   Collection Time: 07/11/19   1:29 PM  Result Value Ref Range   Glucose-Capillary 59 (L) 70 - 99 mg/dL   Comment 1 Notify RN    Comment 2 Document in Chart   CBC with Differential/Platelet     Status: Abnormal   Collection Time: 07/11/19  2:11 PM  Result Value Ref Range   WBC 5.7 4.0 - 10.5 K/uL   RBC 5.03 3.87 - 5.11 MIL/uL   Hemoglobin 15.1 (H) 12.0 - 15.0 g/dL   HCT 51.0 (H) 36.0 - 46.0 %   MCV 101.4 (H) 80.0 - 100.0 fL   MCH 30.0 26.0 - 34.0 pg   MCHC 29.6 (L) 30.0 - 36.0 g/dL   RDW 18.7 (H) 11.5 - 15.5 %   Platelets 231 150 - 400 K/uL   nRBC 0.0 0.0 - 0.2 %   Neutrophils Relative % 62 %   Neutro Abs 3.5 1.7 - 7.7 K/uL   Lymphocytes Relative 32 %   Lymphs Abs 1.8 0.7 - 4.0 K/uL   Monocytes Relative 4 %   Monocytes Absolute 0.3 0.1 - 1.0 K/uL   Eosinophils Relative 1 %   Eosinophils Absolute 0.1 0.0 - 0.5 K/uL   Basophils Relative 1 %   Basophils Absolute 0.1 0.0 - 0.1 K/uL   Immature Granulocytes 0 %   Abs Immature Granulocytes 0.01 0.00 - 0.07 K/uL    Comment: Performed at [facility], [address], [place], [place] [address]  Protime-INR     Status: None   Collection Time: 07/11/19  2:11 PM  Result Value Ref Range   Prothrombin Time 13.4 11.4 - 15.2 seconds   INR 1.0 0.8 - 1.2    Comment: (NOTE) INR goal varies based on device and disease states. Performed at [facility], [place] [address] [place], [place] [address]   APTT     Status: None   Collection Time: 07/11/19  2:11 PM  Result Value Ref Range   aPTT 33 24 - 36 seconds    Comment: Performed at [facility] [address], [place], [place] [address]  Lipid panel     Status: None  Collection Time: 07/11/19  2:11 PM  Result Value Ref Range   Cholesterol 144 0 - 200 mg/dL   Triglycerides 141 <150 mg/dL   HDL 56 >40 mg/dL   Total CHOL/HDL Ratio 2.6 RATIO   VLDL 28 0 - 40 mg/dL   LDL Cholesterol 60 0 - 99 mg/dL    Comment:        Total Cholesterol/HDL:CHD Risk Coronary Heart Disease Risk Table                      Men   Women  1/2 Average Risk   3.4   3.3  Average Risk       5.0   4.4  2 X Average Risk   9.6   7.1  3 X Average Risk  23.4   11.0        Use the calculated Patient Ratio above and the CHD Risk Table to determine the patient's CHD Risk.        ATP III CLASSIFICATION (LDL):  <100     mg/dL   Optimal  100-129  mg/dL   Near or Above                    Optimal  130-159  mg/dL   Borderline  160-189  mg/dL   High  >190     mg/dL   Very High Performed at [facility] [address], [place], [place] [address]    ROS: see HPI    Physical Exam: Vitals:   07/11/19 1430 07/11/19 1454  BP: (!) 112/50 112/78  Resp: 18 17        Physical Exam as noted per Card. As Pt ho COVID  Positive and current test  Pending    Dialysis Orders: Center: [facility]   on TTS . EDW 154 lb ( 70 KG)  HD Bath 3 K/ 2.5 ca   Time 3.5 hr  Heparin NONE. Access LUA AVF  BFR 350  DFR 700     Mircera 130 mcg q 2 wks ( DUE this week)    Units IV/HD   NO VIT D on hd   Assessment/Plan 1. Unstable  Angina  With HO CABG / NSTEMI  01/2019- plans per Card / possible cath  2.  Hperkalemia  With ESRD -   Am k 4.8 and repeat K 6.9   HD TTS / last HD yest  On Schedule  K REPEAT STAT  ??/ she had hd yest and runs on 3 k bath , not sure k 6.9 k correct repeat stat.  HD  Is TTS schedule plan for hd in am unless repeat k warrants hd  3. Hypertension/volume  - no excess vol on cxr or card exam / bp stable  On bp meds pre admit / monitor with uf on hd , tomor hd keep even no uf  4. Ho Chronic combined CHF - Last echo showed reduced EF 30-35%- no excess vol  As above  5. Anemia  -  hgb 12.3. 15.1  No ESA  needs  Monitor trend  ( was due this week )  6. Metabolic bone disease -  No vit d on hd / phos 5.0  Auryxia as binder  7. Nutrition - renal /carb  Mod  When eating  8. DM T2 - per admit   Ernest Haber, PA-C [place] [phone] 07/11/2019, 3:39 PM

## 2019-07-11 NOTE — ED Notes (Signed)
Daughter updated by this RN.

## 2019-07-12 ENCOUNTER — Encounter (HOSPITAL_COMMUNITY)
Admission: EM | Disposition: A | Discharge status: Home Health | Payer: Self-pay | Source: Home / Self Care | Attending: Cardiology

## 2019-07-12 ENCOUNTER — Other Ambulatory Visit: Payer: Self-pay

## 2019-07-12 DIAGNOSIS — I214 Non-ST elevation (NSTEMI) myocardial infarction: Principal | ICD-10-CM

## 2019-07-12 DIAGNOSIS — I25110 Atherosclerotic heart disease of native coronary artery with unstable angina pectoris: Secondary | ICD-10-CM

## 2019-07-12 DIAGNOSIS — E782 Mixed hyperlipidemia: Secondary | ICD-10-CM

## 2019-07-12 DIAGNOSIS — N186 End stage renal disease: Secondary | ICD-10-CM

## 2019-07-12 DIAGNOSIS — I10 Essential (primary) hypertension: Secondary | ICD-10-CM

## 2019-07-12 DIAGNOSIS — Z992 Dependence on renal dialysis: Secondary | ICD-10-CM

## 2019-07-12 DIAGNOSIS — I2510 Atherosclerotic heart disease of native coronary artery without angina pectoris: Secondary | ICD-10-CM | POA: Diagnosis present

## 2019-07-12 DIAGNOSIS — I5043 Acute on chronic combined systolic (congestive) and diastolic (congestive) heart failure: Secondary | ICD-10-CM

## 2019-07-12 DIAGNOSIS — E875 Hyperkalemia: Secondary | ICD-10-CM

## 2019-07-12 HISTORY — PX: LEFT HEART CATH AND CORONARY ANGIOGRAPHY: CATH118249

## 2019-07-12 LAB — BASIC METABOLIC PANEL
Anion gap: 15 (ref 5–15)
BUN: 39 mg/dL — ABNORMAL HIGH (ref 8–23)
CO2: 21 mmol/L — ABNORMAL LOW (ref 22–32)
Calcium: 8.7 mg/dL — ABNORMAL LOW (ref 8.9–10.3)
Chloride: 99 mmol/L (ref 98–111)
Creatinine, Ser: 7.13 mg/dL — ABNORMAL HIGH (ref 0.44–1.00)
GFR calc Af Amer: 5 mL/min — ABNORMAL LOW (ref >60)
GFR calc non Af Amer: 5 mL/min — ABNORMAL LOW (ref >60)
Glucose, Bld: 128 mg/dL — ABNORMAL HIGH (ref 70–99)
Potassium: 6.8 mmol/L (ref 3.5–5.1)
Sodium: 135 mmol/L (ref 135–145)

## 2019-07-12 LAB — CBC WITH DIFFERENTIAL/PLATELET
Abs Immature Granulocytes: 0.01 10*3/uL (ref 0.00–0.07)
Basophils Absolute: 0 10*3/uL (ref 0.0–0.1)
Basophils Relative: 1 %
Eosinophils Absolute: 0.1 10*3/uL (ref 0.0–0.5)
Eosinophils Relative: 1 %
HCT: 44.5 % (ref 36.0–46.0)
Hemoglobin: 13.8 g/dL (ref 12.0–15.0)
Immature Granulocytes: 0 %
Lymphocytes Relative: 28 %
Lymphs Abs: 1.8 10*3/uL (ref 0.7–4.0)
MCH: 30 pg (ref 26.0–34.0)
MCHC: 31 g/dL (ref 30.0–36.0)
MCV: 96.7 fL (ref 80.0–100.0)
Monocytes Absolute: 0.7 10*3/uL (ref 0.1–1.0)
Monocytes Relative: 10 %
Neutro Abs: 4 10*3/uL (ref 1.7–7.7)
Neutrophils Relative %: 60 %
Platelets: 210 10*3/uL (ref 150–400)
RBC: 4.6 MIL/uL (ref 3.87–5.11)
RDW: 18.4 % — ABNORMAL HIGH (ref 11.5–15.5)
WBC: 6.7 10*3/uL (ref 4.0–10.5)
nRBC: 0 % (ref 0.0–0.2)

## 2019-07-12 LAB — RENAL FUNCTION PANEL
Albumin: 3.1 g/dL — ABNORMAL LOW (ref 3.5–5.0)
Albumin: 3.2 g/dL — ABNORMAL LOW (ref 3.5–5.0)
Anion gap: 11 (ref 5–15)
Anion gap: 14 (ref 5–15)
BUN: 40 mg/dL — ABNORMAL HIGH (ref 8–23)
BUN: 20 mg/dL (ref 8–23)
CO2: 25 mmol/L (ref 22–32)
CO2: 25 mmol/L (ref 22–32)
Calcium: 8.8 mg/dL — ABNORMAL LOW (ref 8.9–10.3)
Calcium: 8.7 mg/dL — ABNORMAL LOW (ref 8.9–10.3)
Chloride: 99 mmol/L (ref 98–111)
Chloride: 96 mmol/L — ABNORMAL LOW (ref 98–111)
Creatinine, Ser: 7.3 mg/dL — ABNORMAL HIGH (ref 0.44–1.00)
Creatinine, Ser: 4.88 mg/dL — ABNORMAL HIGH (ref 0.44–1.00)
GFR calc Af Amer: 5 mL/min — ABNORMAL LOW (ref >60)
GFR calc Af Amer: 9 mL/min — ABNORMAL LOW (ref >60)
GFR calc non Af Amer: 5 mL/min — ABNORMAL LOW (ref >60)
GFR calc non Af Amer: 7 mL/min — ABNORMAL LOW (ref >60)
Glucose, Bld: 102 mg/dL — ABNORMAL HIGH (ref 70–99)
Glucose, Bld: 157 mg/dL — ABNORMAL HIGH (ref 70–99)
Phosphorus: 4.7 mg/dL — ABNORMAL HIGH (ref 2.5–4.6)
Phosphorus: 3.4 mg/dL (ref 2.5–4.6)
Potassium: 6.3 mmol/L (ref 3.5–5.1)
Potassium: 4.7 mmol/L (ref 3.5–5.1)
Sodium: 135 mmol/L (ref 135–145)
Sodium: 135 mmol/L (ref 135–145)

## 2019-07-12 LAB — CBC
HCT: 44.3 % (ref 36.0–46.0)
Hemoglobin: 13.6 g/dL (ref 12.0–15.0)
MCH: 29.8 pg (ref 26.0–34.0)
MCHC: 30.7 g/dL (ref 30.0–36.0)
MCV: 97.1 fL (ref 80.0–100.0)
Platelets: 178 10*3/uL (ref 150–400)
RBC: 4.56 MIL/uL (ref 3.87–5.11)
RDW: 18.5 % — ABNORMAL HIGH (ref 11.5–15.5)
WBC: 5.6 10*3/uL (ref 4.0–10.5)
nRBC: 0 % (ref 0.0–0.2)

## 2019-07-12 LAB — LIPID PANEL
Cholesterol: 134 mg/dL (ref 0–200)
HDL: 52 mg/dL (ref >40)
LDL Cholesterol: 66 mg/dL (ref 0–99)
Total CHOL/HDL Ratio: 2.6 RATIO
Triglycerides: 78 mg/dL (ref <150)
VLDL: 16 mg/dL (ref 0–40)

## 2019-07-12 LAB — TROPONIN I (HIGH SENSITIVITY)
Troponin I (High Sensitivity): 2721 ng/L (ref <18)
Troponin I (High Sensitivity): 3107 ng/L (ref <18)
Troponin I (High Sensitivity): 2689 ng/L (ref <18)

## 2019-07-12 LAB — POTASSIUM: Potassium: 6.8 mmol/L (ref 3.5–5.1)

## 2019-07-12 LAB — GLUCOSE, CAPILLARY
Glucose-Capillary: 104 mg/dL — ABNORMAL HIGH (ref 70–99)
Glucose-Capillary: 129 mg/dL — ABNORMAL HIGH (ref 70–99)
Glucose-Capillary: 141 mg/dL — ABNORMAL HIGH (ref 70–99)
Glucose-Capillary: 151 mg/dL — ABNORMAL HIGH (ref 70–99)
Glucose-Capillary: 63 mg/dL — ABNORMAL LOW (ref 70–99)
Glucose-Capillary: 68 mg/dL — ABNORMAL LOW (ref 70–99)

## 2019-07-12 LAB — HEPATITIS PANEL, ACUTE
HCV Ab: NONREACTIVE
Hep A IgM: NONREACTIVE
Hep B C IgM: NONREACTIVE
Hepatitis B Surface Ag: NONREACTIVE

## 2019-07-12 LAB — CK: Total CK: 108 U/L (ref 38–234)

## 2019-07-12 LAB — HEPATITIS B CORE ANTIBODY, TOTAL: Hep B Core Total Ab: NONREACTIVE

## 2019-07-12 SURGERY — LEFT HEART CATH AND CORONARY ANGIOGRAPHY
Anesthesia: LOCAL

## 2019-07-12 MED ORDER — CARVEDILOL 12.5 MG PO TABS
12.5000 mg | ORAL_TABLET | Freq: Two times a day (BID) | ORAL | Status: DC
  Administered 2019-07-12: 07:00:00 12.5 mg via ORAL
  Filled 2019-07-12: qty 1

## 2019-07-12 MED ORDER — SODIUM CHLORIDE 0.9 % IV SOLN: INTRAVENOUS | Status: DC

## 2019-07-12 MED ORDER — PRO-STAT SUGAR FREE PO LIQD
30.0000 mL | Freq: Two times a day (BID) | ORAL | Status: DC
  Administered 2019-07-12 – 2019-07-13 (×2): 30 mL via ORAL
  Filled 2019-07-12 (×3): qty 30

## 2019-07-12 MED ORDER — ATORVASTATIN CALCIUM 80 MG PO TABS: 80.0000 mg | ORAL_TABLET | Freq: Every day | ORAL | Status: DC

## 2019-07-12 MED ORDER — ISOSORBIDE MONONITRATE ER 60 MG PO TB24
60.0000 mg | ORAL_TABLET | Freq: Two times a day (BID) | ORAL | Status: DC
  Administered 2019-07-12 – 2019-07-13 (×2): 60 mg via ORAL
  Filled 2019-07-12 (×3): qty 1

## 2019-07-12 MED ORDER — LORATADINE 10 MG PO TABS: 10.0000 mg | ORAL_TABLET | Freq: Every day | ORAL | Status: DC | PRN

## 2019-07-12 MED ORDER — SODIUM CHLORIDE 0.9% FLUSH: 3.0000 mL | INTRAVENOUS | Status: DC | PRN

## 2019-07-12 MED ORDER — CLOPIDOGREL BISULFATE 75 MG PO TABS: 75.0000 mg | ORAL_TABLET | Freq: Every day | ORAL | Status: DC

## 2019-07-12 MED ORDER — ALBUTEROL SULFATE (2.5 MG/3ML) 0.083% IN NEBU: 2.5000 mg | INHALATION_SOLUTION | Freq: Four times a day (QID) | RESPIRATORY_TRACT | Status: DC | PRN

## 2019-07-12 MED ORDER — SODIUM CHLORIDE 0.9 % IV SOLN: 250.0000 mL | INTRAVENOUS | Status: DC | PRN

## 2019-07-12 MED ORDER — HYDRALAZINE HCL 20 MG/ML IJ SOLN: 10.0000 mg | INTRAMUSCULAR | Status: AC | PRN

## 2019-07-12 MED ORDER — CARVEDILOL 12.5 MG PO TABS: 12.5000 mg | ORAL_TABLET | Freq: Two times a day (BID) | ORAL | Status: DC

## 2019-07-12 MED ORDER — LIDOCAINE HCL (PF) 1 % IJ SOLN
INTRAMUSCULAR | Status: DC | PRN
  Administered 2019-07-12: 15 mL via INTRADERMAL

## 2019-07-12 MED ORDER — HEPARIN (PORCINE) IN NACL 1000-0.9 UT/500ML-% IV SOLN
INTRAVENOUS | Status: DC | PRN
  Administered 2019-07-12 (×3): 500 mL

## 2019-07-12 MED ORDER — SODIUM CHLORIDE 0.9% FLUSH
3.0000 mL | Freq: Two times a day (BID) | INTRAVENOUS | Status: DC
  Administered 2019-07-13: 11:00:00 3 mL via INTRAVENOUS

## 2019-07-12 MED ORDER — MORPHINE SULFATE (PF) 2 MG/ML IV SOLN: 2.0000 mg | INTRAVENOUS | Status: DC | PRN

## 2019-07-12 MED ORDER — NITROGLYCERIN 0.4 MG SL SUBL: 0.4000 mg | SUBLINGUAL_TABLET | SUBLINGUAL | Status: DC | PRN

## 2019-07-12 MED ORDER — CALCIUM GLUCONATE-NACL 1-0.675 GM/50ML-% IV SOLN
1.0000 g | Freq: Once | INTRAVENOUS | Status: AC
  Administered 2019-07-12: 02:00:00 1000 mg via INTRAVENOUS
  Filled 2019-07-12: qty 50

## 2019-07-12 MED ORDER — HYDRALAZINE HCL 25 MG PO TABS
25.0000 mg | ORAL_TABLET | Freq: Two times a day (BID) | ORAL | Status: DC
  Administered 2019-07-13: 10:00:00 25 mg via ORAL
  Filled 2019-07-12 (×3): qty 1

## 2019-07-12 MED ORDER — ZOLPIDEM TARTRATE 5 MG PO TABS: 5.0000 mg | ORAL_TABLET | Freq: Every evening | ORAL | Status: DC | PRN

## 2019-07-12 MED ORDER — ASPIRIN 81 MG PO CHEW: 81.0000 mg | CHEWABLE_TABLET | ORAL | Status: DC

## 2019-07-12 MED ORDER — NITROGLYCERIN 0.4 MG SL SUBL
0.4000 mg | SUBLINGUAL_TABLET | SUBLINGUAL | Status: DC | PRN
  Administered 2019-07-12: 10:00:00 0.4 mg via SUBLINGUAL
  Filled 2019-07-12: qty 1

## 2019-07-12 MED ORDER — IOHEXOL 350 MG/ML SOLN
INTRAVENOUS | Status: DC | PRN
  Administered 2019-07-12: 75 mL via INTRACARDIAC

## 2019-07-12 MED ORDER — MIDAZOLAM HCL 2 MG/2ML IJ SOLN
INTRAMUSCULAR | Status: AC
  Filled 2019-07-12: qty 2

## 2019-07-12 MED ORDER — ASPIRIN 81 MG PO CHEW: 81.0000 mg | CHEWABLE_TABLET | Freq: Every day | ORAL | Status: DC

## 2019-07-12 MED ORDER — HEPARIN (PORCINE) IN NACL 1000-0.9 UT/500ML-% IV SOLN
INTRAVENOUS | Status: AC
  Filled 2019-07-12: qty 1000

## 2019-07-12 MED ORDER — ACETAMINOPHEN 325 MG PO TABS: 650.0000 mg | ORAL_TABLET | ORAL | Status: DC | PRN

## 2019-07-12 MED ORDER — MIDAZOLAM HCL 2 MG/2ML IJ SOLN
INTRAMUSCULAR | Status: DC | PRN
  Administered 2019-07-12: 1 mg via INTRAVENOUS

## 2019-07-12 MED ORDER — ASPIRIN EC 81 MG PO TBEC
81.0000 mg | DELAYED_RELEASE_TABLET | Freq: Every day | ORAL | Status: DC
  Administered 2019-07-12 – 2019-07-13 (×2): 81 mg via ORAL
  Filled 2019-07-12 (×2): qty 1

## 2019-07-12 MED ORDER — LIDOCAINE HCL (PF) 1 % IJ SOLN
INTRAMUSCULAR | Status: AC
  Filled 2019-07-12: qty 30

## 2019-07-12 MED ORDER — ONDANSETRON HCL 4 MG/2ML IJ SOLN: 4.0000 mg | Freq: Four times a day (QID) | INTRAMUSCULAR | Status: DC | PRN

## 2019-07-12 MED ORDER — SODIUM CHLORIDE 0.9% FLUSH: 3.0000 mL | Freq: Two times a day (BID) | INTRAVENOUS | Status: DC

## 2019-07-12 MED ORDER — PANTOPRAZOLE SODIUM 20 MG PO TBEC
20.0000 mg | DELAYED_RELEASE_TABLET | Freq: Every day | ORAL | Status: DC
  Administered 2019-07-13: 10:00:00 20 mg via ORAL
  Filled 2019-07-12 (×2): qty 1

## 2019-07-12 MED ORDER — GLUCOSE 40 % PO GEL
ORAL | Status: AC
  Administered 2019-07-12: 07:00:00 37.5 g
  Filled 2019-07-12: qty 1

## 2019-07-12 MED ORDER — ASPIRIN EC 81 MG PO TBEC: 81.0000 mg | DELAYED_RELEASE_TABLET | Freq: Every day | ORAL | Status: DC

## 2019-07-12 MED ORDER — SODIUM CHLORIDE 0.9 % IV SOLN
INTRAVENOUS | Status: AC | PRN
  Administered 2019-07-12: 5 mL/h via INTRAVENOUS

## 2019-07-12 MED ORDER — LABETALOL HCL 5 MG/ML IV SOLN: 10.0000 mg | INTRAVENOUS | Status: AC | PRN

## 2019-07-12 MED ORDER — CLOPIDOGREL BISULFATE 75 MG PO TABS
75.0000 mg | ORAL_TABLET | Freq: Every day | ORAL | Status: DC
  Administered 2019-07-12 – 2019-07-13 (×2): 75 mg via ORAL
  Filled 2019-07-12 (×2): qty 1

## 2019-07-12 SURGICAL SUPPLY — 9 items
CATH DXT MULTI JL4 JR4 ANG PIG (CATHETERS) ×1 IMPLANT
CATH INFINITI 5FR JL5 (CATHETERS) ×1 IMPLANT
KIT HEART LEFT (KITS) ×2 IMPLANT
PACK CARDIAC CATHETERIZATION (CUSTOM PROCEDURE TRAY) ×2 IMPLANT
SHEATH PINNACLE 6F 10CM (SHEATH) ×1 IMPLANT
SHEATH PROBE COVER 6X72 (BAG) ×1 IMPLANT
TRANSDUCER W/STOPCOCK (MISCELLANEOUS) ×2 IMPLANT
TUBING CIL FLEX 10 FLL-RA (TUBING) ×2 IMPLANT
WIRE EMERALD 3MM-J .035X150CM (WIRE) ×1 IMPLANT

## 2019-07-12 NOTE — Progress Notes (Addendum)
   CTSP regarding CP, SOB and bradycardia. History reviewed. She also does have history of sinus node dysfunction requiring prior beta blocker cessation. This was resumed in fall 2020 and subsequently titrated without issue. She was admitted with NSTEMI. She also had brief junctional rhythm in the ED so carvedilol was held on admission. She went to the cath lab, underwent PCI with stable rhythm, and carvedilol resumed post-cath with first dose this AM.   She reports she felt well post-dialysis but in the last hour developed complaint of CP relieved with 1 SL NTG. Subsequent telemetry has shown brief junctional then sinus bradycardia predominantly in the 40s. Patient continues to feel her breathing isn't quite right. Blood pressure low-normal, O2 sats 100% on 4L. Although originally reported resolution of chest pain, she now reports 9/10 chest pressure in the middle of her chest similar to presentation. EKG shows sinus bradycardia 45bpm with diffuse TWI I, II, avL, V3-V6. On examination patient appears uncomfortable, minimal increase in WOB, auscultation with RRR and faint crackles L base. Dr. [name] called to bedside given ongoing CP and limited options for medical therapy given tendency for softer BP. She will discuss options with interventional team.  Addendum: Dr. [name] reviewed with interventional team who recommends return to the cath lab. Discussed consent with patient who is agreeable. Nurse aware. Orders written. She received her ASA/Plavix this AM. Will dc carvedilol.  At patient's request I also discussed update with [name], her daughter whose phone number is [phone]. Laverne lives in [place] but had been staying with her mom during the pandemic - she is currently back in [place] gathering some personal items but will be travelling back closer to the area later today or tomorrow.  Akaiya Touchette PA-C

## 2019-07-12 NOTE — Progress Notes (Signed)
[name] Progress Note   Subjective:   S/p LHC yesterday with intervention.  HD overnight for K - completed early this morning so essentially received her scheduled Tues Tx.   Objective Vitals:   07/12/19 0530 07/12/19 0545 07/12/19 0630 07/12/19 0733  BP: (!) 130/44 (!) 154/68 128/64 (!) 77/55  Pulse: 80 80 80 79  Resp:  _0 Temp:  98 F (36.7 C) 98.2 F (36.8 C) 97.8 F (36.6 C)  TempSrc:  Oral Oral Oral  SpO2:  100% 100% 100%  Weight:  75.2 kg    Height:       Physical Exam General: elderly woman lying on side comfortably Heart: no rub or S4 appreciated Lungs: clear Abdomen: soft Extremities: no edema Dialysis Access: AVF +T/B, dressing in place  Additional Objective Labs: Basic Metabolic Panel: Recent Labs  Lab 07/11/19 1411 07/11/19 1602 07/12/19 0046 07/12/19 0302  NA 136 135 135 135  K 6.9* 6.2* 6.8*  6.8* 6.3*  CL 99 102 99 99  CO2 22  --  21* 25  GLUCOSE 120* 152* 128* 102*  BUN 33* 36* 39* 40*  CREATININE 6.85* 6.50* 7.13* 7.30*  CALCIUM 9.3  --  8.7* 8.8*  PHOS  --   --   --  4.7*   Liver Function Tests: Recent Labs  Lab 07/11/19 1411 07/12/19 0302  AST 41  --   ALT 27  --   ALKPHOS 86  --   BILITOT 0.7  --   PROT 7.3  --   ALBUMIN 3.4* 3.1*   No results for input(s): LIPASE, AMYLASE in the last 168 hours. CBC: Recent Labs  Lab 07/11/19 1411 07/11/19 1602 07/12/19 0301  WBC 5.7  --  6.7  NEUTROABS 3.5  --  4.0  HGB 15.1* 18.4* 13.8  HCT 51.0* 54.0* 44.5  MCV 101.4*  --  96.7  PLT 231  --  210   Blood Culture    Component Value Date/Time   SDES URINE, RANDOM 01/18/2019 0748   SPECREQUEST NONE 01/18/2019 0748   CULT (A) 01/18/2019 0748    <10,000 COLONIES/mL INSIGNIFICANT GROWTH Performed at [facility] [address] [place], [place] [address]    REPTSTATUS 01/19/2019 FINAL 01/18/2019 0748    Cardiac Enzymes: No results for input(s): CKTOTAL, CKMB, CKMBINDEX, TROPONINI in the last 168  hours. CBG: Recent Labs  Lab 07/11/19 1329 07/11/19 1816 07/12/19 0033 07/12/19 0648 07/12/19 0751  GLUCAP 59* 90 129* 68* 151*   Iron Studies: No results for input(s): IRON, TIBC, TRANSFERRIN, FERRITIN in the last 72 hours. _1 @ Studies/Results: CARDIAC CATHETERIZATION  Result Date: 07/12/2019  Mid RCA lesion is 100% stenosed.  Origin to Prox Graft lesion is 100% stenosed.  Ost LM to Mid LM lesion is 60% stenosed.  Ost Cx to Prox Cx lesion is 100% stenosed.  Ost LAD to Prox LAD lesion is 99% stenosed.  Prox LAD to Mid LAD lesion is 100% stenosed.  Dist Graft to Insertion lesion is 99% stenosed.  Previously placed Origin to Prox Graft stent (unknown type) is widely patent.  A drug-eluting stent was successfully placed.  Post intervention, there is a 0% residual stenosis.  Monica [name] is a 86 y.o. female  [id number] LOCATION:  FACILITY: [facility] PHYSICIAN: Quay Burow, M.D. 03/16/1931 DATE OF PROCEDURE:  07/11/2019 DATE OF DISCHARGE: CARDIAC CATHETERIZATION / PCI DES SVG RI (ISR) History obtained from chart review.Monica [name] is a 86 y.o. female with a hx of CAD (  s/p CABG 2017, NSTEMI 01/2019 with PCI), chronic combined CHF, moderate MR, anemia, bradycardia/sinus node dysfunction, ESRD on HD, GERD and DM2who is being seen today for the evaluation of chest painat the request of Dr, Training and development officer.  Her last cardiac catheterization was performed in July and her stents were patent.  Her EF is in the 30 to 35% range.  She has had chest pain for last several weeks worse today with an abnormal EKG and positive high sensitive troponins.  She did dialyze yesterday and has a potassium of 6.2 today.  Because of ongoing chest pain, positive enzymes and abnormal EKG she was brought to the Cath Lab semiurgently for angiography potential intervention. PROCEDURE DESCRIPTION: The patient was brought to the second floor [facility] lab in the postabsorptive state.  She was not premedicated .  Her  right groin was prepped and shaved in usual sterile fashion. Xylocaine 1% was used for local anesthesia. A 5 French sheath was inserted into the right common femoral  artery using standard Seldinger technique.  5 French right left Judkins diagnostic catheters (JL 5) and 5 French pigtail catheter used for selective coronary angiography, selective vein graft and IMA graft angiography and obtain left heart pressures.  Omnipaque dye was used for the entirety of the case.  Retrograde aorta, left ventricular and pullback pressures were recorded.  (LVEDP was 30). The patient received Angiomax bolus followed by infusion with a therapeutic ACT.  She received an additional 600 mg of p.o. Plavix since she had not taken her Plavix today.  Isovue dye was used for the entirety of the intervention.  Retroaortic pressures monitored in the case.  Using a 6 [place] AL-1 guide catheter along with a 0.14 [place] guidewire the area of "in-stent restenosis" within the distal ramus intermedius branch SV G stented segment was crossed with little difficulty.  The entirety of that stent was predilated with a 2 mm x 12 mm balloon.  Following this a 2.5 mm x 28 mm long Synergy drug-eluting stent was then carefully positioned across the previously stented segment and deployed at 16 atm (2.7 mm).  The wire was then withdrawn out of the native vessel and 200 mcg of intragraft nitroglycerin was administered revealing a widely patent distal vessel and patent stented segment.   Ms. Vasudevan has high-grade in-stent restenosis within the distal ramus intermedius branch SVG stent.  The LIMA to the LAD was patent.  The vein to the obtuse marginal branch was patent as well (proximal stented segment widely patent).  The native right and RCA SVG were known to be occluded.  Her LVEDP was significantly elevated at 30.  She was dialyzed yesterday.  Her serum potassium was 6.2 by i-STAT.  She had successful restenting of the ramus branch in-stent restenosis  with a synergy drug-eluting stent with excellent result.  Angiomax was discontinued at the end of the case.  She will need hemodialysis early evening because of her elevated LVEDP and her hyperkalemia.  Both sheaths were sewn securely in place.  The patient left lab in stable condition.  Dr. Stanford Breed was notified of these results. Quay Burow. MD, [facility] 07/11/2019 5:03 PM   DG Chest Port 1 View  Result Date: 07/11/2019 CLINICAL DATA:  Chest pain. EXAM: PORTABLE CHEST 1 VIEW COMPARISON:  06/21/2019 FINDINGS: Stable cardiac enlargement and evidence of prior CABG. Stable mild interstitial prominence without evidence of overt edema, airspace consolidation, pleural fluid or pneumothorax. IMPRESSION: Stable cardiomegaly. Mild interstitial prominence without overt edema. Electronically  Signed   By: Aletta Edouard M.D.   On: 07/11/2019 13:52   Medications: . sodium chloride    . sodium chloride     . aspirin EC  81 mg Oral Daily  . atorvastatin  80 mg Oral q1800  . carvedilol  12.5 mg Oral BID WC  . clopidogrel  75 mg Oral Daily  . hydrALAZINE  25 mg Oral BID  . insulin aspart  0-15 Units Subcutaneous TID WC  . insulin aspart  0-5 Units Subcutaneous QHS  . isosorbide mononitrate  60 mg Oral BID  . pantoprazole  20 mg Oral Daily  . sodium chloride flush  3 mL Intravenous Once  . sodium chloride flush  3 mL Intravenous Q12H    Dialysis Orders: Dialysis Orders: Center: [facility]   on TTS . EDW 154 lb ( 70 KG)  HD Bath 3 K/ 2.5 ca   Time 3.5 hr  Heparin NONE. Access LUA AVF  BFR 350  DFR 700     Mircera 130 mcg q 2 wks ( DUE this week)    Units IV/HD   NO VIT D on hd   Assessment/Plan: 1. Unstable angina:  S/p LHC with intervention yesterday.  No chest pain currently.  2. ESRD: s/p HD overnight to correct K. Recheck K this AM to ensure improved. Typically TTS, HD completed this AM.   3. HTN/volume:  Normotensive overnight 4. Anemia:  Hb ~13, no ESA.  5. Secondary hyperparathyroidism:   Phos 4.7  here.  Not on VDRA outpt.  6. Nutrition:  Albumin 3.1, start prostat.  7.  Hyperkalemia:  Unclear etiology - appears typically not a problem.  Check this AM as well as CK. Renal diet.  8. DM 2: per primary. 9.  H/o combined CHF:  Last echo showed reduced EF 30-35%.  Cardiology following. Per cardiology note pt with elevated LVEDP - will need EDW challenged outpt.    OK to discharge from nephrology perspective as long as this AMs K is normal.    Jannifer Hick MD 07/12/2019, 7:59 AM  [facility] Pager: [phone]

## 2019-07-12 NOTE — Interval H&P Note (Signed)
History and Physical Interval Note:  07/12/2019 12:58 PM  Patient underwent DES PCI for in-stent restenosis of SVG-OM (incorrectly noted as ramus intermedius) -> was doing well overnight, however has had recurrent chest pain.  Now presents for relook cath.  She reports she felt well post-dialysis but in the last hour developed complaint of CP relieved with 1 SL NTG. Subsequent telemetry has shown brief junctional then sinus bradycardia predominantly in the 40s. Patient continues to feel her breathing isn't quite right. Blood pressure low-normal, O2 sats 100% on 4L. Although originally reported resolution of chest pain, she now reports 9/10 chest pressure in the middle of her chest similar to presentation. EKG shows sinus bradycardia 45bpm with diffuse TWI I, II, avL, V3-V6. On examination patient appears uncomfortable, minimal increase in WOB, auscultation with RRR and faint crackles L base. Dr. [name] called to bedside given ongoing CP and limited options for medical therapy given tendency for softer BP. She will discuss options with interventional team.   Addendum: Dr. [name] reviewed with interventional team who recommends return to the cath lab. Discussed consent with patient who is agreeable. Nurse aware. Orders written. She received her ASA/Plavix this AM. Will dc carvedilol.   At patient's request I also discussed update with [name], her daughter whose phone number is [phone]. Laverne lives in [place] but had been staying with her mom during the pandemic - she is currently back in [place] gathering some personal items but will be travelling back closer to the area later today or tomorrow.   Dayna Dunn PA-C    [place]  has presented today for surgery, with the diagnosis of relook.  The various methods of treatment have been discussed with the patient and family. After consideration of risks, benefits and other options for treatment, the patient has consented to  Procedure(s): LEFT  HEART CATH AND CORONARY ANGIOGRAPHY (N/A) as a surgical intervention.  The patient's history has been reviewed, patient examined, no change in status, stable for surgery.  I have reviewed the patient's chart and labs.  Questions were answered to the patient's satisfaction.     [name]

## 2019-07-12 NOTE — Progress Notes (Signed)
Paged Trudy k+6.8. Alerted Fanta in dialysis that patient was k+ 6.8 and needed to be dialyzed.

## 2019-07-12 NOTE — Plan of Care (Signed)

## 2019-07-12 NOTE — Progress Notes (Signed)
Patient's daughter [name] called and updated on patients status.

## 2019-07-12 NOTE — Progress Notes (Signed)
Rapid Response called regarding patient sustaining HR in low/mid 40's, 100% O2 on 4L complaining of increase in shortness of breath, chest pain, BP 103/64, 99/52,97/51. 12 lead EKG done. Dr. [name] paged.

## 2019-07-12 NOTE — Significant Event (Signed)
Rapid Response Event Note  I came by to see Monica [name] after her cath procedure. She just finished eating lunch and was feeling so much better. She stated she felt great, we talked and laughed, and she seemed much better.   Pollyann Roa R

## 2019-07-12 NOTE — Progress Notes (Addendum)
Progress Note  Patient Name: Monica [name] Date of Encounter: 07/12/2019  Primary Cardiologist: Ena Dawley, MD   Subjective   She is feeling better post cath and hemodialysis this am.   Inpatient Medications    Scheduled Meds: . aspirin EC  81 mg Oral Daily  . atorvastatin  80 mg Oral q1800  . carvedilol  12.5 mg Oral BID WC  . clopidogrel  75 mg Oral Daily  . hydrALAZINE  25 mg Oral BID  . insulin aspart  0-15 Units Subcutaneous TID WC  . insulin aspart  0-5 Units Subcutaneous QHS  . isosorbide mononitrate  60 mg Oral BID  . pantoprazole  20 mg Oral Daily  . sodium chloride flush  3 mL Intravenous Once  . sodium chloride flush  3 mL Intravenous Q12H   Continuous Infusions: . sodium chloride    . sodium chloride     PRN Meds: sodium chloride, acetaminophen, albuterol, loratadine, morphine injection, nitroGLYCERIN, ondansetron (ZOFRAN) IV, sodium chloride flush   Vital Signs    Vitals:   07/12/19 0500 07/12/19 0530 07/12/19 0545 07/12/19 0630  BP: 131/79 (!) 130/44 (!) 154/68 128/64  Pulse: 80 80 80 80  Resp:   16 16  Temp:   98 F (36.7 C) 98.2 F (36.8 C)  TempSrc:   Oral Oral  SpO2:   100% 100%  Weight:   75.2 kg   Height:        Intake/Output Summary (Last 24 hours) at 07/12/2019 0705 Last data filed at 07/12/2019 0545 Gross per 24 hour  Intake --  Output 0 ml  Net 0 ml   Last 3 Weights 07/12/2019 07/12/2019 07/11/2019  Weight (lbs) 165 lb 12.6 oz 166 lb 0.1 oz 165 lb 5.5 oz  Weight (kg) 75.2 kg 75.3 kg 75 kg     Telemetry    SR - Personally Reviewed  ECG    SR, negative T waves in the lateral leads - Personally Reviewed  Physical Exam  Gasping for breath while talking GEN: No acute distress.   Neck: No JVD Cardiac: RRR, no murmurs, rubs, or gallops.  Respiratory: rales bilaterally. GI: Soft, nontender, non-distended  MS: No edema; No deformity. Neuro:  Nonfocal  Psych: Normal affect   Labs    High Sensitivity Troponin:   Recent Labs   Lab 06/21/19 0744 06/21/19 1300 06/21/19 1403 07/11/19 1411 07/12/19 0046  TROPONINIHS 275* 3,568* 8,971* 301* 2,721*      Chemistry Recent Labs  Lab 07/11/19 1411 07/11/19 1602 07/12/19 0046 07/12/19 0302  NA 136 135 135 135  K 6.9* 6.2* 6.8*  6.8* 6.3*  CL 99 102 99 99  CO2 22  --  21* 25  GLUCOSE 120* 152* 128* 102*  BUN 33* 36* 39* 40*  CREATININE 6.85* 6.50* 7.13* 7.30*  CALCIUM 9.3  --  8.7* 8.8*  PROT 7.3  --   --   --   ALBUMIN 3.4*  --   --  3.1*  AST 41  --   --   --   ALT 27  --   --   --   ALKPHOS 86  --   --   --   BILITOT 0.7  --   --   --   GFRNONAA 5*  --  5* 5*  GFRAA 6*  --  5* 5*  ANIONGAP 15  --  15 11     Hematology Recent Labs  Lab 07/11/19 1411 07/11/19 1602 07/12/19 0301  WBC 5.7  --  6.7  RBC 5.03  --  4.60  HGB 15.1* 18.4* 13.8  HCT 51.0* 54.0* 44.5  MCV 101.4*  --  96.7  MCH 30.0  --  30.0  MCHC 29.6*  --  31.0  RDW 18.7*  --  18.4*  PLT 231  --  210    BNPNo results for input(s): BNP, PROBNP in the last 168 hours.   DDimer No results for input(s): DDIMER in the last 168 hours.   Radiology    CARDIAC CATHETERIZATION  Result Date: 07/12/2019  Mid RCA lesion is 100% stenosed.  Origin to Prox Graft lesion is 100% stenosed.  Ost LM to Mid LM lesion is 60% stenosed.  Ost Cx to Prox Cx lesion is 100% stenosed.  Ost LAD to Prox LAD lesion is 99% stenosed.  Prox LAD to Mid LAD lesion is 100% stenosed.  Dist Graft to Insertion lesion is 99% stenosed.  Previously placed Origin to Prox Graft stent (unknown type) is widely patent.  A drug-eluting stent was successfully placed.  Post intervention, there is a 0% residual stenosis.  Monica [name] is a 86 y.o. female  [id number] LOCATION:  FACILITY: [facility] PHYSICIAN: Jonathan Berry, M.D. 12/01/1930 DATE OF PROCEDURE:  07/11/2019 DATE OF DISCHARGE: CARDIAC CATHETERIZATION / PCI DES SVG RI (ISR) History obtained from chart review.Monica [name] is a 86 y.o. female with a hx of CAD (s/p CABG 2017,  NSTEMI 01/2019 with PCI), chronic combined CHF, moderate MR, anemia, bradycardia/sinus node dysfunction, ESRD on HD, GERD and DM2who is being seen today for the evaluation of chest painat the request of Dr, Tegeler.  Her last cardiac catheterization was performed in July and her stents were patent.  Her EF is in the 30 to 35% range.  She has had chest pain for last several weeks worse today with an abnormal EKG and positive high sensitive troponins.  She did dialyze yesterday and has a potassium of 6.2 today.  Because of ongoing chest pain, positive enzymes and abnormal EKG she was brought to the Cath Lab semiurgently for angiography potential intervention. PROCEDURE DESCRIPTION: The patient was brought to the second floor [facility] cath lab in the postabsorptive state.  She was not premedicated .  Her right groin was prepped and shaved in usual sterile fashion. Xylocaine 1% was used for local anesthesia. A 5 French sheath was inserted into the right common femoral  artery using standard Seldinger technique.  5 French right left Judkins diagnostic catheters (JL 5) and 5 French pigtail catheter used for selective coronary angiography, selective vein graft and IMA graft angiography and obtain left heart pressures.  Omnipaque dye was used for the entirety of the case.  Retrograde aorta, left ventricular and pullback pressures were recorded.  (LVEDP was 30). The patient received Angiomax bolus followed by infusion with a therapeutic ACT.  She received an additional 600 mg of p.o. Plavix since she had not taken her Plavix today.  Isovue dye was used for the entirety of the intervention.  Retroaortic pressures monitored in the case.  Using a 6 French AL-1 guide catheter along with a 0.14 Prowater guidewire the area of "in-stent restenosis" within the distal ramus intermedius branch SV G stented segment was crossed with little difficulty.  The entirety of that stent was predilated with a 2 mm x 12 mm balloon.   Following this a 2.5 mm x 28 mm long Synergy drug-eluting stent was then carefully positioned across the previously stented segment and   deployed at 16 atm (2.7 mm).  The wire was then withdrawn out of the native vessel and 200 mcg of intragraft nitroglycerin was administered revealing a widely patent distal vessel and patent stented segment.   Ms. Fleece has high-grade in-stent restenosis within the distal ramus intermedius branch SVG stent.  The LIMA to the LAD was patent.  The vein to the obtuse marginal branch was patent as well (proximal stented segment widely patent).  The native right and RCA SVG were known to be occluded.  Her LVEDP was significantly elevated at 30.  She was dialyzed yesterday.  Her serum potassium was 6.2 by i-STAT.  She had successful restenting of the ramus branch in-stent restenosis with a synergy drug-eluting stent with excellent result.  Angiomax was discontinued at the end of the case.  She will need hemodialysis early evening because of her elevated LVEDP and her hyperkalemia.  Both sheaths were sewn securely in place.  The patient left lab in stable condition.  Dr. Crenshaw was notified of these results. Jonathan Berry. MD, FACC 07/11/2019 5:03 PM   DG Chest Port 1 View  Result Date: 07/11/2019 CLINICAL DATA:  Chest pain. EXAM: PORTABLE CHEST 1 VIEW COMPARISON:  06/21/2019 FINDINGS: Stable cardiac enlargement and evidence of prior CABG. Stable mild interstitial prominence without evidence of overt edema, airspace consolidation, pleural fluid or pneumothorax. IMPRESSION: Stable cardiomegaly. Mild interstitial prominence without overt edema. Electronically Signed   By: Glenn  Yamagata M.D.   On: 07/11/2019 13:52    Cardiac Studies   LHC: 07/11/2019   Mid RCA lesion is 100% stenosed.  Origin to Prox Graft lesion is 100% stenosed.  Ost LM to Mid LM lesion is 60% stenosed.  Ost Cx to Prox Cx lesion is 100% stenosed.  Ost LAD to Prox LAD lesion is 99% stenosed.  Prox LAD  to Mid LAD lesion is 100% stenosed.  Dist Graft to Insertion lesion is 99% stenosed.  Previously placed Origin to Prox Graft stent (unknown type) is widely patent.  A drug-eluting stent was successfully placed.  Post intervention, there is a 0% residual stenosis  IMPRESSION: Ms. Yonke has high-grade in-stent restenosis within the distal ramus intermedius branch SVG stent.  The LIMA to the LAD was patent.  The vein to the obtuse marginal branch was patent as well (proximal stented segment widely patent).  The native right and RCA SVG were known to be occluded.  Her LVEDP was significantly elevated at 30.  She was dialyzed yesterday.  Her serum potassium was 6.2 by i-STAT.  She had successful restenting of the ramus branch in-stent restenosis with a synergy drug-eluting stent with excellent result.  Angiomax was discontinued at the end of the case.  She will need hemodialysis early evening because of her elevated LVEDP and her hyperkalemia.  Both sheaths were sewn securely in place.  The patient left lab in stable condition.  Dr. Crenshaw was notified of these results.   Patient Profile     86 y.o. female   Assessment & Plan    1. CAD, NSTEMI troponin 2700, cath showed high-grade in-stent restenosis within the distal ramus intermedius branch SVG stent.  The LIMA to the LAD was patent.  The vein to the obtuse marginal branch was patent as well (proximal stented segment widely patent).  The native right and RCA SVG were known to be occluded.  Her LVEDP was significantly elevated at 30.  she has HD early this am, I would repeat HD tomorrow, she is still fluid   overloaded and symptomatic with resting SOB. ECG has changed from LBBB yesterday that has resolved, also junctional rhythm overnight, now SR with negative T waves in the lateral leads.  2. ESRD, hyperkalemia, s/p hemodialysis this am, post D labs are pending, she is still fluid overloaded, I would suggest to repeat another HD tomorrow  morning.  3. Acute on chronic systolic CHF - volume managed by HD,  4. Hypertension - well controlled  5. Hyperlipidemia - continue atorvastatin, lipids are at goal  For questions or updates, please contact [facility] Please consult [email or web address] for contact info under        Signed, Adysen Raphael, MD  07/12/2019, 7:05 AM    

## 2019-07-12 NOTE — Progress Notes (Signed)
Patient transported to dialysis

## 2019-07-12 NOTE — Significant Event (Addendum)
Rapid Response Event Note  Overview: Cardiac - Bradycardia/Chest Pain and Respiratory - SOB  Initial Focused Assessment: I received a call from the nurse with concerns of patient's HR being in the 40s, coupled with patient having chest pain and shortness of breath. I was with another patient and came to the bedside as soon as I could. Upon arrival, patient was no longer having chest pain - RN administered NTG SL 0.4 mg x 1 but patient was still endorsing some shortness of breath and HR was 40-45 - it appeared junctional - seems to go in and out sinus bradycardia and then back to junctional. SBP 90-100s, MAPS 60s (BP obtained RT Wrist), skin cool to touch in all extremities, able to palpate pulses. 99% on 4L [place] with RR 16-20 - no increased WOB and no accessory but she is short of breath, can speak in small sentences briefly. She endorsed that this has been ongoing since after HD. Lung sounds -- clear in the upper fields, diminished in the bases, coarse crackles in the bases.  Mildly anxious. Oxygen weaned to 2L [place] - saturations still remained 99%.   RN paged MD and I paged the APP, I spoke with APP at 1100, APP/MD to come to the bedside   Interventions: -- STAT EKG  -- Troponin (already ordered)   Plan of Care: -- I talked to the patient about utilizing BIPAP - per patient, she has had trouble the wearing a BIPAP previously - she endorsed that she could not tolerate it. I don't feel like the patient needs BIPAP currently and it might exacerbate her symptoms.  -- CARDS at bedside, patient now endorses substernal chest pressure, MD to see if interventional team can assist, options might be limited --1205 - patient to proceed to Cath Lab  Event Summary:  Start Time 1028 End Time [place], [place]

## 2019-07-12 NOTE — Progress Notes (Signed)
Paged on-call pt c/o of  intermittent SOB. Tele called about sustaining junctional. EKG showed afib with junctional. Received order for stat labs and calcium gluconate. Will continue to monitor.

## 2019-07-12 NOTE — H&P (View-Only) (Signed)
Progress Note  Patient Name: Saniah Schroeter Date of Encounter: 07/12/2019  Primary Cardiologist: Ena Dawley, MD   Subjective   She is feeling better post cath and hemodialysis this am.   Inpatient Medications    Scheduled Meds: . aspirin EC  81 mg Oral Daily  . atorvastatin  80 mg Oral q1800  . carvedilol  12.5 mg Oral BID WC  . clopidogrel  75 mg Oral Daily  . hydrALAZINE  25 mg Oral BID  . insulin aspart  0-15 Units Subcutaneous TID WC  . insulin aspart  0-5 Units Subcutaneous QHS  . isosorbide mononitrate  60 mg Oral BID  . pantoprazole  20 mg Oral Daily  . sodium chloride flush  3 mL Intravenous Once  . sodium chloride flush  3 mL Intravenous Q12H   Continuous Infusions: . sodium chloride    . sodium chloride     PRN Meds: sodium chloride, acetaminophen, albuterol, loratadine, morphine injection, nitroGLYCERIN, ondansetron (ZOFRAN) IV, sodium chloride flush   Vital Signs    Vitals:   07/12/19 0500 07/12/19 0530 07/12/19 0545 07/12/19 0630  BP: 131/79 (!) 130/44 (!) 154/68 128/64  Pulse: 80 80 80 80  Resp:   16 16  Temp:   98 F (36.7 C) 98.2 F (36.8 C)  TempSrc:   Oral Oral  SpO2:   100% 100%  Weight:   75.2 kg   Height:        Intake/Output Summary (Last 24 hours) at 07/12/2019 0705 Last data filed at 07/12/2019 0545 Gross per 24 hour  Intake --  Output 0 ml  Net 0 ml   Last 3 Weights 07/12/2019 07/12/2019 07/11/2019  Weight (lbs) 165 lb 12.6 oz 166 lb 0.1 oz 165 lb 5.5 oz  Weight (kg) 75.2 kg 75.3 kg 75 kg     Telemetry    SR - Personally Reviewed  ECG    SR, negative T waves in the lateral leads - Personally Reviewed  Physical Exam  Gasping for breath while talking GEN: No acute distress.   Neck: No JVD Cardiac: RRR, no murmurs, rubs, or gallops.  Respiratory: rales bilaterally. GI: Soft, nontender, non-distended  MS: No edema; No deformity. Neuro:  Nonfocal  Psych: Normal affect   Labs    High Sensitivity Troponin:   Recent Labs   Lab 06/21/19 0744 06/21/19 1300 06/21/19 1403 07/11/19 1411 07/12/19 0046  TROPONINIHS 275* 3,568* 8,971* 301* 2,721*      Chemistry Recent Labs  Lab 07/11/19 1411 07/11/19 1602 07/12/19 0046 07/12/19 0302  NA 136 135 135 135  K 6.9* 6.2* 6.8*  6.8* 6.3*  CL 99 102 99 99  CO2 22  --  21* 25  GLUCOSE 120* 152* 128* 102*  BUN 33* 36* 39* 40*  CREATININE 6.85* 6.50* 7.13* 7.30*  CALCIUM 9.3  --  8.7* 8.8*  PROT 7.3  --   --   --   ALBUMIN 3.4*  --   --  3.1*  AST 41  --   --   --   ALT 27  --   --   --   ALKPHOS 86  --   --   --   BILITOT 0.7  --   --   --   GFRNONAA 5*  --  5* 5*  GFRAA 6*  --  5* 5*  ANIONGAP 15  --  15 11     Hematology Recent Labs  Lab 07/11/19 1411 07/11/19 1602 07/12/19 0301  WBC 5.7  --  6.7  RBC 5.03  --  4.60  HGB 15.1* 18.4* 13.8  HCT 51.0* 54.0* 44.5  MCV 101.4*  --  96.7  MCH 30.0  --  30.0  MCHC 29.6*  --  31.0  RDW 18.7*  --  18.4*  PLT 231  --  210    BNPNo results for input(s): BNP, PROBNP in the last 168 hours.   DDimer No results for input(s): DDIMER in the last 168 hours.   Radiology    CARDIAC CATHETERIZATION  Result Date: 07/12/2019  Mid RCA lesion is 100% stenosed.  Origin to Prox Graft lesion is 100% stenosed.  Ost LM to Mid LM lesion is 60% stenosed.  Ost Cx to Prox Cx lesion is 100% stenosed.  Ost LAD to Prox LAD lesion is 99% stenosed.  Prox LAD to Mid LAD lesion is 100% stenosed.  Dist Graft to Insertion lesion is 99% stenosed.  Previously placed Origin to Prox Graft stent (unknown type) is widely patent.  A drug-eluting stent was successfully placed.  Post intervention, there is a 0% residual stenosis.  Phung Kotas is a 86 y.o. female  [id number] LOCATION:  FACILITY: [facility] PHYSICIAN: Quay Burow, M.D. Jun 21, 1931 DATE OF PROCEDURE:  07/11/2019 DATE OF DISCHARGE: CARDIAC CATHETERIZATION / PCI DES SVG RI (ISR) History obtained from chart review.Joyanne Eddinger is a 86 y.o. female with a hx of CAD (s/p CABG 2017,  NSTEMI 01/2019 with PCI), chronic combined CHF, moderate MR, anemia, bradycardia/sinus node dysfunction, ESRD on HD, GERD and DM2who is being seen today for the evaluation of chest painat the request of Dr, Training and development officer.  Her last cardiac catheterization was performed in July and her stents were patent.  Her EF is in the 30 to 35% range.  She has had chest pain for last several weeks worse today with an abnormal EKG and positive high sensitive troponins.  She did dialyze yesterday and has a potassium of 6.2 today.  Because of ongoing chest pain, positive enzymes and abnormal EKG she was brought to the Cath Lab semiurgently for angiography potential intervention. PROCEDURE DESCRIPTION: The patient was brought to the second floor [place] cath lab in the postabsorptive state.  She was not premedicated .  Her right groin was prepped and shaved in usual sterile fashion. Xylocaine 1% was used for local anesthesia. A 5 French sheath was inserted into the right common femoral  artery using standard Seldinger technique.  5 French right left Judkins diagnostic catheters (JL 5) and 5 French pigtail catheter used for selective coronary angiography, selective vein graft and IMA graft angiography and obtain left heart pressures.  Omnipaque dye was used for the entirety of the case.  Retrograde aorta, left ventricular and pullback pressures were recorded.  (LVEDP was 30). The patient received Angiomax bolus followed by infusion with a therapeutic ACT.  She received an additional 600 mg of p.o. Plavix since she had not taken her Plavix today.  Isovue dye was used for the entirety of the intervention.  Retroaortic pressures monitored in the case.  Using a 6 [place] AL-1 guide catheter along with a 0.14 Prowater guidewire the area of "in-stent restenosis" within the distal ramus intermedius branch SV G stented segment was crossed with little difficulty.  The entirety of that stent was predilated with a 2 mm x 12 mm balloon.   Following this a 2.5 mm x 28 mm long Synergy drug-eluting stent was then carefully positioned across the previously stented segment and  deployed at 16 atm (2.7 mm).  The wire was then withdrawn out of the native vessel and 200 mcg of intragraft nitroglycerin was administered revealing a widely patent distal vessel and patent stented segment.   Ms. Pasion has high-grade in-stent restenosis within the distal ramus intermedius branch SVG stent.  The LIMA to the LAD was patent.  The vein to the obtuse marginal branch was patent as well (proximal stented segment widely patent).  The native right and RCA SVG were known to be occluded.  Her LVEDP was significantly elevated at 30.  She was dialyzed yesterday.  Her serum potassium was 6.2 by i-STAT.  She had successful restenting of the ramus branch in-stent restenosis with a synergy drug-eluting stent with excellent result.  Angiomax was discontinued at the end of the case.  She will need hemodialysis early evening because of her elevated LVEDP and her hyperkalemia.  Both sheaths were sewn securely in place.  The patient left lab in stable condition.  Dr. Stanford Breed was notified of these results. Quay Burow. MD, [facility] 07/11/2019 5:03 PM   DG Chest Port 1 View  Result Date: 07/11/2019 CLINICAL DATA:  Chest pain. EXAM: PORTABLE CHEST 1 VIEW COMPARISON:  06/21/2019 FINDINGS: Stable cardiac enlargement and evidence of prior CABG. Stable mild interstitial prominence without evidence of overt edema, airspace consolidation, pleural fluid or pneumothorax. IMPRESSION: Stable cardiomegaly. Mild interstitial prominence without overt edema. Electronically Signed   By: Aletta Edouard M.D.   On: 07/11/2019 13:52    Cardiac Studies   LHC: 07/11/2019   Mid RCA lesion is 100% stenosed.  Origin to Prox Graft lesion is 100% stenosed.  Ost LM to Mid LM lesion is 60% stenosed.  Ost Cx to Prox Cx lesion is 100% stenosed.  Ost LAD to Prox LAD lesion is 99% stenosed.  Prox LAD  to Mid LAD lesion is 100% stenosed.  Dist Graft to Insertion lesion is 99% stenosed.  Previously placed Origin to Prox Graft stent (unknown type) is widely patent.  A drug-eluting stent was successfully placed.  Post intervention, there is a 0% residual stenosis  IMPRESSION: Ms. Ponder has high-grade in-stent restenosis within the distal ramus intermedius branch SVG stent.  The LIMA to the LAD was patent.  The vein to the obtuse marginal branch was patent as well (proximal stented segment widely patent).  The native right and RCA SVG were known to be occluded.  Her LVEDP was significantly elevated at 30.  She was dialyzed yesterday.  Her serum potassium was 6.2 by i-STAT.  She had successful restenting of the ramus branch in-stent restenosis with a synergy drug-eluting stent with excellent result.  Angiomax was discontinued at the end of the case.  She will need hemodialysis early evening because of her elevated LVEDP and her hyperkalemia.  Both sheaths were sewn securely in place.  The patient left lab in stable condition.  Dr. Stanford Breed was notified of these results.   Patient Profile     86 y.o. female   Assessment & Plan    1. CAD, NSTEMI troponin 2700, cath showed high-grade in-stent restenosis within the distal ramus intermedius branch SVG stent.  The LIMA to the LAD was patent.  The vein to the obtuse marginal branch was patent as well (proximal stented segment widely patent).  The native right and RCA SVG were known to be occluded.  Her LVEDP was significantly elevated at 30.  she has HD early this am, I would repeat HD tomorrow, she is still fluid  overloaded and symptomatic with resting SOB. ECG has changed from LBBB yesterday that has resolved, also junctional rhythm overnight, now SR with negative T waves in the lateral leads.  2. ESRD, hyperkalemia, s/p hemodialysis this am, post D labs are pending, she is still fluid overloaded, I would suggest to repeat another HD tomorrow  morning.  3. Acute on chronic systolic CHF - volume managed by HD,  4. Hypertension - well controlled  5. Hyperlipidemia - continue atorvastatin, lipids are at goal  For questions or updates, please contact [place] Please consult [email or web address] for contact info under        Signed, Ena Dawley, MD  07/12/2019, 7:05 AM

## 2019-07-13 ENCOUNTER — Telehealth: Payer: Self-pay | Admitting: Physician Assistant

## 2019-07-13 ENCOUNTER — Ambulatory Visit: Payer: Self-pay

## 2019-07-13 DIAGNOSIS — Z955 Presence of coronary angioplasty implant and graft: Secondary | ICD-10-CM

## 2019-07-13 DIAGNOSIS — I25709 Atherosclerosis of coronary artery bypass graft(s), unspecified, with unspecified angina pectoris: Secondary | ICD-10-CM

## 2019-07-13 LAB — CBC
HCT: 42.1 % (ref 36.0–46.0)
Hemoglobin: 13.3 g/dL (ref 12.0–15.0)
MCH: 30 pg (ref 26.0–34.0)
MCHC: 31.6 g/dL (ref 30.0–36.0)
MCV: 94.8 fL (ref 80.0–100.0)
Platelets: 166 10*3/uL (ref 150–400)
RBC: 4.44 MIL/uL (ref 3.87–5.11)
RDW: 18.1 % — ABNORMAL HIGH (ref 11.5–15.5)
WBC: 5.3 10*3/uL (ref 4.0–10.5)
nRBC: 0 % (ref 0.0–0.2)

## 2019-07-13 LAB — GLUCOSE, CAPILLARY
Glucose-Capillary: 42 mg/dL — CL (ref 70–99)
Glucose-Capillary: 74 mg/dL (ref 70–99)
Glucose-Capillary: 110 mg/dL — ABNORMAL HIGH (ref 70–99)

## 2019-07-13 LAB — HEPATITIS B SURFACE ANTIBODY, QUANTITATIVE: Hep B S AB Quant (Post): 12.3 m[IU]/mL (ref >9.9)

## 2019-07-13 LAB — HEPATITIS B E ANTIGEN: Hep B E Ag: NEGATIVE

## 2019-07-13 MED ORDER — ISOSORBIDE MONONITRATE ER 60 MG PO TB24: 120.0000 mg | ORAL_TABLET | Freq: Every day | ORAL | Status: DC

## 2019-07-13 MED ORDER — ACETAMINOPHEN 325 MG PO TABS: 650.0000 mg | ORAL_TABLET | ORAL | Status: DC | PRN

## 2019-07-13 MED ORDER — ISOSORBIDE MONONITRATE ER 60 MG PO TB24: 60.0000 mg | ORAL_TABLET | Freq: Two times a day (BID) | ORAL | Status: DC

## 2019-07-13 MED ORDER — ISOSORBIDE MONONITRATE ER 60 MG PO TB24
60.0000 mg | ORAL_TABLET | Freq: Once | ORAL | Status: AC
  Administered 2019-07-13: 10:00:00 60 mg via ORAL
  Filled 2019-07-13: qty 1

## 2019-07-13 MED ORDER — SODIUM CHLORIDE 0.9% FLUSH: 3.0000 mL | INTRAVENOUS | Status: DC | PRN

## 2019-07-13 MED ORDER — SODIUM CHLORIDE 0.9 % IV SOLN: 250.0000 mL | INTRAVENOUS | Status: DC | PRN

## 2019-07-13 MED ORDER — ISOSORBIDE MONONITRATE ER 60 MG PO TB24: 60.0000 mg | ORAL_TABLET | Freq: Two times a day (BID) | ORAL | 6 refills | Status: AC

## 2019-07-13 MED ORDER — SODIUM CHLORIDE 0.9% FLUSH: 3.0000 mL | Freq: Two times a day (BID) | INTRAVENOUS | Status: DC

## 2019-07-13 NOTE — Progress Notes (Addendum)
Progress Note  Patient Name: Monica [name] Date of Encounter: 07/13/2019  Primary Cardiologist: Ena Dawley, MD   Subjective   She is feeling better post cath and hemodialysis this am.   Inpatient Medications    Scheduled Meds: . aspirin EC  81 mg Oral Daily  . atorvastatin  80 mg Oral q1800  . clopidogrel  75 mg Oral Daily  . feeding supplement (PRO-STAT SUGAR FREE 64)  30 mL Oral BID  . hydrALAZINE  25 mg Oral BID  . insulin aspart  0-15 Units Subcutaneous TID WC  . insulin aspart  0-5 Units Subcutaneous QHS  . isosorbide mononitrate  60 mg Oral BID  . pantoprazole  20 mg Oral Daily  . sodium chloride flush  3 mL Intravenous Once  . sodium chloride flush  3 mL Intravenous Q12H  . sodium chloride flush  3 mL Intravenous Q12H  . sodium chloride flush  3 mL Intravenous Q12H   Continuous Infusions: . sodium chloride    . sodium chloride    . sodium chloride     PRN Meds: sodium chloride, sodium chloride, acetaminophen, albuterol, loratadine, morphine injection, nitroGLYCERIN, ondansetron (ZOFRAN) IV, sodium chloride flush, sodium chloride flush   Vital Signs    Vitals:   07/12/19 2331 07/13/19 0300 07/13/19 0351 07/13/19 0738  BP: (!) 99/51  (!) 142/81 124/64  Pulse: 76 77 82 83  Resp: '14 18 18 14  '$ Temp: 98.2 F (36.8 C)  97.7 F (36.5 C) 97.6 F (36.4 C)  TempSrc: Oral  Oral Oral  SpO2: 97% 96% 97% 98%  Weight:      Height:        Intake/Output Summary (Last 24 hours) at 07/13/2019 0857 Last data filed at 07/12/2019 2300 Gross per 24 hour  Intake 602 ml  Output --  Net 602 ml   Last 3 Weights 07/12/2019 07/12/2019 07/11/2019  Weight (lbs) 165 lb 12.6 oz 166 lb 0.1 oz 165 lb 5.5 oz  Weight (kg) 75.2 kg 75.3 kg 75 kg     Telemetry    SR - Personally Reviewed  ECG    SR, negative T waves in the lateral leads - Personally Reviewed  Physical Exam  Gasping for breath while talking GEN: No acute distress.   Neck: No JVD Cardiac: RRR, no murmurs, rubs, or  gallops.  Respiratory: rales bilaterally. GI: Soft, nontender, non-distended  MS: No edema; No deformity. Neuro:  Nonfocal  Psych: Normal affect   Labs    High Sensitivity Troponin:   Recent Labs  Lab 06/21/19 1403 07/11/19 1411 07/12/19 0046 07/12/19 0840 07/12/19 1111  TROPONINIHS 8,971* 301* 2,721* 3,107* 2,689*      Chemistry Recent Labs  Lab 07/11/19 1411 07/12/19 0046 07/12/19 0302 07/12/19 0845  NA 136 135 135 135  K 6.9* 6.8*  6.8* 6.3* 4.7  CL 99 99 99 96*  CO2 22 21* 25 25  GLUCOSE 120* 128* 102* 157*  BUN 33* 39* 40* 20  CREATININE 6.85* 7.13* 7.30* 4.88*  CALCIUM 9.3 8.7* 8.8* 8.7*  PROT 7.3  --   --   --   ALBUMIN 3.4*  --  3.1* 3.2*  AST 41  --   --   --   ALT 27  --   --   --   ALKPHOS 86  --   --   --   BILITOT 0.7  --   --   --   GFRNONAA 5* 5* 5* 7*  GFRAA 6*  5* 5* 9*  ANIONGAP '15 15 11 14     '$ Hematology Recent Labs  Lab 07/12/19 0301 07/12/19 0840 07/13/19 0235  WBC 6.7 5.6 5.3  RBC 4.60 4.56 4.44  HGB 13.8 13.6 13.3  HCT 44.5 44.3 42.1  MCV 96.7 97.1 94.8  MCH 30.0 29.8 30.0  MCHC 31.0 30.7 31.6  RDW 18.4* 18.5* 18.1*  PLT 210 178 166    BNPNo results for input(s): BNP, PROBNP in the last 168 hours.   DDimer No results for input(s): DDIMER in the last 168 hours.   Radiology    CARDIAC CATHETERIZATION  Result Date: 07/12/2019  ------ NATIVE CORONARIES -------  Dist LM to Prox LAD lesion is 30% stenosed.  Mid LAD-1 lesion is 95% stenosed. Mid LAD-2 lesion is 100% stenosed.  Ost Cx to Mid Cx lesion is 100% stenosed. Ramus lesion is 99% stenosed.  Prox RCA lesion is 70% stenosed. Prox RCA to Mid RCA lesion is 90% stenosed. Mid RCA lesion is 95% stenosed. Dist RCA lesion is 100% stenosed with 100% stenosed side branch in RPAV.  Some of the distal RCA branches are filled via left to right collaterals  ------ GRAFTS ------  LIMA graft was not injected as it was widely patent on January 4  SVG-dRCA graft was not visualized --  known occlusion.  SVG-OM1 is large. Previously placed Mid Graft to Insertion stent (2 previous overlapping drug-eluting stents covered with a third drug-eluting stent) is widely patent.  SVG-RI is large. Previously placed Origin to Prox Graft stent (drug-eluting stent) is widely patent. (There is mild competitive flow)  ------------  LV end diastolic pressure is normal.  Mid Graft to Dist Graft lesion is 100% stenosed.  SUMMARY  Widely patent SVG-OM stent placed on 07/11/2019 with no residual stenosis.  Also widely patent SVG-RI with previous stent placed in July 2020  Stable occlusive native CAD with known occlusion of native RCA, LAD and circumflex.  Severe disease of the proximal RI.  Known patent LIMA-LAD and occluded SVG-RCA.  Normal LVEDP  No culprit lesion for recurrence of anginal pain.  Suspect possible residual downstream occlusion from initial PCI on 07/11/2019 RECOMMENDATIONS  Continue current medications, anticipate possible discharge tomorrow if stable.  Continue medical management of existing CAD. [name], MD  CARDIAC CATHETERIZATION  Result Date: 07/12/2019  Mid RCA lesion is 100% stenosed.  Origin to Prox Graft lesion is 100% stenosed.  Ost LM to Mid LM lesion is 60% stenosed.  Ost Cx to Prox Cx lesion is 100% stenosed.  Ost LAD to Prox LAD lesion is 99% stenosed.  Prox LAD to Mid LAD lesion is 100% stenosed.  Dist Graft to Insertion lesion is 99% stenosed.  Previously placed Origin to Prox Graft stent (unknown type) is widely patent.  A drug-eluting stent was successfully placed.  Post intervention, there is a 0% residual stenosis.  Monica [name] is a 86 y.o. female  [id number] LOCATION:  FACILITY: [facility] PHYSICIAN: Quay Burow, M.D. 1931/01/31 DATE OF PROCEDURE:  07/11/2019 DATE OF DISCHARGE: CARDIAC CATHETERIZATION / PCI DES SVG RI (ISR) History obtained from chart review.Monica [name] is a 86 y.o. female with a hx of CAD (s/p CABG 2017, NSTEMI 01/2019 with PCI), chronic combined  CHF, moderate MR, anemia, bradycardia/sinus node dysfunction, ESRD on HD, GERD and DM2who is being seen today for the evaluation of chest painat the request of Dr, Training and development officer.  Her last cardiac catheterization was performed in July and her stents were patent.  Her EF is in  the 30 to 35% range.  She has had chest pain for last several weeks worse today with an abnormal EKG and positive high sensitive troponins.  She did dialyze yesterday and has a potassium of 6.2 today.  Because of ongoing chest pain, positive enzymes and abnormal EKG she was brought to the Cath Lab semiurgently for angiography potential intervention. PROCEDURE DESCRIPTION: The patient was brought to the second floor [facility] cath lab in the postabsorptive state.  She was not premedicated .  Her right groin was prepped and shaved in usual sterile fashion. Xylocaine 1% was used for local anesthesia. A 5 French sheath was inserted into the right common femoral  artery using standard Seldinger technique.  5 French right left Judkins diagnostic catheters (JL 5) and 5 French pigtail catheter used for selective coronary angiography, selective vein graft and IMA graft angiography and obtain left heart pressures.  Omnipaque dye was used for the entirety of the case.  Retrograde aorta, left ventricular and pullback pressures were recorded.  (LVEDP was 30). The patient received Angiomax bolus followed by infusion with a therapeutic ACT.  She received an additional 600 mg of p.o. Plavix since she had not taken her Plavix today.  Isovue dye was used for the entirety of the intervention.  Retroaortic pressures monitored in the case.  Using a 6 [place] AL-1 guide catheter along with a 0.14 [place] guidewire the area of "in-stent restenosis" within the distal ramus intermedius branch SV G stented segment was crossed with little difficulty.  The entirety of that stent was predilated with a 2 mm x 12 mm balloon.  Following this a 2.5 mm x 28 mm long Synergy  drug-eluting stent was then carefully positioned across the previously stented segment and deployed at 16 atm (2.7 mm).  The wire was then withdrawn out of the native vessel and 200 mcg of intragraft nitroglycerin was administered revealing a widely patent distal vessel and patent stented segment.   Ms. Leanos has high-grade in-stent restenosis within the distal ramus intermedius branch SVG stent.  The LIMA to the LAD was patent.  The vein to the obtuse marginal branch was patent as well (proximal stented segment widely patent).  The native right and RCA SVG were known to be occluded.  Her LVEDP was significantly elevated at 30.  She was dialyzed yesterday.  Her serum potassium was 6.2 by i-STAT.  She had successful restenting of the ramus branch in-stent restenosis with a synergy drug-eluting stent with excellent result.  Angiomax was discontinued at the end of the case.  She will need hemodialysis early evening because of her elevated LVEDP and her hyperkalemia.  Both sheaths were sewn securely in place.  The patient left lab in stable condition.  Dr. Stanford Breed was notified of these results. Quay Burow. MD, [facility] 07/11/2019 5:03 PM   DG Chest Port 1 View  Result Date: 07/11/2019 CLINICAL DATA:  Chest pain. EXAM: PORTABLE CHEST 1 VIEW COMPARISON:  06/21/2019 FINDINGS: Stable cardiac enlargement and evidence of prior CABG. Stable mild interstitial prominence without evidence of overt edema, airspace consolidation, pleural fluid or pneumothorax. IMPRESSION: Stable cardiomegaly. Mild interstitial prominence without overt edema. Electronically Signed   By: Aletta Edouard M.D.   On: 07/11/2019 13:52    Cardiac Studies   LHC: 07/11/2019   Mid RCA lesion is 100% stenosed.  Origin to Prox Graft lesion is 100% stenosed.  Ost LM to Mid LM lesion is 60% stenosed.  Ost Cx to Prox Cx lesion is  100% stenosed.  Ost LAD to Prox LAD lesion is 99% stenosed.  Prox LAD to Mid LAD lesion is 100% stenosed.  Dist  Graft to Insertion lesion is 99% stenosed.  Previously placed Origin to Prox Graft stent (unknown type) is widely patent.  A drug-eluting stent was successfully placed.  Post intervention, there is a 0% residual stenosis  IMPRESSION: Ms. Decarolis has high-grade in-stent restenosis within the distal ramus intermedius branch SVG stent.  The LIMA to the LAD was patent.  The vein to the obtuse marginal branch was patent as well (proximal stented segment widely patent).  The native right and RCA SVG were known to be occluded.  Her LVEDP was significantly elevated at 30.  She was dialyzed yesterday.  Her serum potassium was 6.2 by i-STAT.  She had successful restenting of the ramus branch in-stent restenosis with a synergy drug-eluting stent with excellent result.  Angiomax was discontinued at the end of the case.  She will need hemodialysis early evening because of her elevated LVEDP and her hyperkalemia.  Both sheaths were sewn securely in place.  The patient left lab in stable condition.  Dr. Stanford Breed was notified of these results.   Patient Profile     86 y.o. female   Assessment & Plan    1. CAD, NSTEMI troponin 2700, cath showed high-grade in-stent restenosis within the distal ramus intermedius branch SVG stent.  The LIMA to the LAD was patent.  The vein to the obtuse marginal branch was patent as well (proximal stented segment widely patent).  The native right and RCA SVG were known to be occluded.  Her LVEDP was significantly elevated at 30.  she has HD yesterday morning after which she felt better however later on she developed you shortness of breath, chest pain that progressed from 2-9 out of 10 and patient was taken back to the Cath Lab.  It showed widely patent SVG to OM stent placed day before with no residual stenosis no other new lesions, suspect possible residual downstream occlusion from initial PCI.  ECG has changed from LBBB yesterday that has resolved, also junctional rhythm  overnight, now SR with negative T waves in the lateral leads.  Patient is stable for discharge today, her blood pressure was slightly low when she got up, will discontinue hydralazine.  2. ESRD, hyperkalemia, s/p hemodialysis this am, post D labs are pending, she is still fluid overloaded, I would suggest to repeat another HD tomorrow morning.  3. Acute on chronic systolic CHF - volume managed by HD,  4. Hypertension - well controlled  5. Hyperlipidemia - continue atorvastatin, lipids are at goal  For questions or updates, please contact [facility] Please consult [email or web address] for contact info under    Signed, Ena Dawley, MD  07/13/2019, 8:57 AM

## 2019-07-13 NOTE — Telephone Encounter (Signed)
**Note De-Identified  Obfuscation** Per the pts chart she is being discharged from the hospital today. We will call her tomorrow.

## 2019-07-13 NOTE — Discharge Summary (Signed)
Discharge Summary    Patient ID: Monica [name] MRN: [id number]; DOB: Sep 13, 1930  Admit date: 07/11/2019 Discharge date: 07/13/2019  Primary Care Provider: Benito Mccreedy, MD  Primary Cardiologist: Ena Dawley, MD  Primary Electrophysiologist:  None   Discharge Diagnoses    Principal Problem:   Non-ST elevation (NSTEMI) myocardial infarction [facility]) Active Problems:   Hyperlipidemia   Diabetes mellitus with renal complications [facility])   Essential hypertension   Coronary artery disease involving coronary bypass graft of native heart with angina pectoris ([place])   Obesity (BMI 30.0-34.9)   ESRD (end stage renal disease) ([place])   S/P coronary artery stent placement   Bradycardia   Acute on chronic combined systolic and diastolic CHF (congestive heart failure) (HCC)   Coronary artery disease, occlusive: Occluded native vessels, graft dependent   Diagnostic Studies/Procedures    Cardiac Cath 07/11/19  PROCEDURE DESCRIPTION:   The patient was brought to the second floor [place] [facility]iac cath lab in the postabsorptive state.  She was not premedicated .  Her right groin was prepped and shaved in usual sterile fashion. Xylocaine 1% was used for local anesthesia. A 5 French sheath was inserted into the right common femoral  artery using standard Seldinger technique.  5 French right left Judkins diagnostic catheters (JL 5) and 5 French pigtail catheter used for selective coronary angiography, selective vein graft and IMA graft angiography and obtain left heart pressures.  Omnipaque dye was used for the entirety of the case.  Retrograde aorta, left ventricular and pullback pressures were recorded.  (LVEDP was 30).  The patient received Angiomax bolus followed by infusion with a therapeutic ACT.  She received an additional 600 mg of p.o. Plavix since she had not taken her Plavix today.  Isovue dye was used for the entirety of the intervention.  Retroaortic pressures monitored in the case.   Using a 6 [place] AL-1 guide catheter along with a 0.14 Prowater guidewire the area of "in-stent restenosis" within the distal ramus intermedius branch SV G stented segment was crossed with little difficulty.  The entirety of that stent was predilated with a 2 mm x 12 mm balloon.  Following this a 2.5 mm x 28 mm long Synergy drug-eluting stent was then carefully positioned across the previously stented segment and deployed at 16 atm (2.7 mm).  The wire was then withdrawn out of the native vessel and 200 mcg of intragraft nitroglycerin was administered revealing a widely patent distal vessel and patent stented segment.   Mid RCA lesion is 100% stenosed.  Origin to Prox Graft lesion is 100% stenosed.  Ost LM to Mid LM lesion is 60% stenosed.  Ost Cx to Prox Cx lesion is 100% stenosed.  Ost LAD to Prox LAD lesion is 99% stenosed.  Prox LAD to Mid LAD lesion is 100% stenosed.  Dist Graft to Insertion lesion is 99% stenosed.  Previously placed Origin to Prox Graft stent (unknown type) is widely patent.  A drug-eluting stent was successfully placed.  Post intervention, there is a 0% residual stenosis.  IMPRESSION: Monica [name] has high-grade in-stent restenosis within the distal ramus intermedius branch SVG stent.  The LIMA to the LAD was patent.  The vein to the obtuse marginal branch was patent as well (proximal stented segment widely patent).  The native right and RCA SVG were known to be occluded.  Her LVEDP was significantly elevated at 30.  She was dialyzed yesterday.  Her serum potassium was 6.2 by i-STAT.  She had successful restenting of  the ramus branch in-stent restenosis with a synergy drug-eluting stent with excellent result.  Angiomax was discontinued at the end of the case.  She will need hemodialysis early evening because of her elevated LVEDP and her hyperkalemia.  Both sheaths were sewn securely in place.  The patient left lab in stable condition.  Dr. Stanford Breed was notified of these  results.  Quay Burow. MD, [facility] 07/11/2019 5:03 PM  Cardiac Cath 07/12/19 Conclusion    ------ NATIVE CORONARIES -------  Dist LM to Prox LAD lesion is 30% stenosed.  Mid LAD-1 lesion is 95% stenosed. Mid LAD-2 lesion is 100% stenosed.  Ost Cx to Mid Cx lesion is 100% stenosed. Ramus lesion is 99% stenosed.  Prox RCA lesion is 70% stenosed. Prox RCA to Mid RCA lesion is 90% stenosed. Mid RCA lesion is 95% stenosed. Dist RCA lesion is 100% stenosed with 100% stenosed side branch in RPAV.  Some of the distal RCA branches are filled via left to right collaterals  ------ GRAFTS ------  LIMA graft was not injected as it was widely patent on January 4  SVG-dRCA graft was not visualized -- known occlusion.  SVG-OM1 is large. Previously placed Mid Graft to Insertion stent (2 previous overlapping drug-eluting stents covered with a third drug-eluting stent) is widely patent.  SVG-RI is large. Previously placed Origin to Prox Graft stent (drug-eluting stent) is widely patent. (There is mild competitive flow)  ------------  LV end diastolic pressure is normal.  Mid Graft to Dist Graft lesion is 100% stenosed.   SUMMARY  Widely patent SVG-OM stent placed on 07/11/2019 with no residual stenosis.  Also widely patent SVG-RI with previous stent placed in July 2020  Stable occlusive native CAD with known occlusion of native RCA, LAD and circumflex.  Severe disease of the proximal RI.  Known patent LIMA-LAD and occluded SVG-RCA.  Normal LVEDP  No culprit lesion for recurrence of anginal pain.  Suspect possible residual downstream occlusion from initial PCI on 07/11/2019   RECOMMENDATIONS  Continue current medications, anticipate possible discharge tomorrow if stable.  Continue medical management of existing CAD.  [name], MD     _____________   History of Present Illness     Monica [name] is a 86 y.o. female with CAD (s/p CABG in 2017, NSTEMI 01/2019 s/p PCI), chronic  combined CHF, moderate mitral regurgitation, anemia, bradycardia/sinus node dysfunction, ESRD on HD (TTS), GERD, HLD and DM who presented to the hospital with NSTEMI.  She has had a tumultous course in the last year. She was admitted July 2020 forNSTEMI. Cath 01/07/2019 cath showed occluded/atretic SVG to the distal RCA, severe proximal to stent and in-stent restenosis of SVG to OM with successful PTCA/with overlapping DES stent and successful DES to the ostial proximal SVG to the RI.Echo showed new reduced LVEF 30 to 35% with moderately dilated right and left atrium moderate MR and mild AI. She was placed on ASA, Plaix and metoprolol. She was readmitted in mid-July with fatigue and dizziness with junctional bradycardia HR in the 40s, therefore metoprolol was discontinued. She was then again readmitted 02/10/19 with chest pain and elevated troponin. Nuclear stress test was abnormal, prompting repeat cath with stable disease and no targets for PCI. Medical therapy was recommended. She has been readmitted several times since then for volume overload or chest pain. She tested positive for Covid 05/31/19 as well as on admission 06/21/19. During November 2020 admission, plan was for possible amyloid work-up but both the daughter and the patient were  requesting discharge after she was feeling better. During her December 2020 admission with chest pain, per discussion with patient and daughter, plan was for continued medical therapy.  If recurrent pain, plan was to reinitiate conversation regarding repeat cath. She was ultimately discharged on 06/22/2019 with medication changes. There was a period of time she was also reported to be refusing medications and thought to be incompetent per daughter. Palliative care was introduced last year but not well received per notes.  She was readmitted 07/11/2019 with chest pain waking her out of sleep. Initial electrocardiogram showed marked ST depression in the inferolateral leads. F/u  ECG showed junctional rhythm, left axis deviation, improvement in ST depression. Her blood pressure was borderline soft. Her carvedilol was held due to junctional rhythm. Her symptoms were felt concerning for unstable angina so she was admitted with plan for cath.   Hospital Course     She did rule in for NSTEMI with hsTroponin of 3107. She underwent cath with PCI as above with successful restenting of the SVG-ramus branch in-stent restenosis with a synergy drug-eluting stent. Her LVEDP was elevated. She underwent dialysis early yesterday morning and initially did very well and home med regimen was resumed. However, yesterday late morning she developed recurrent chest pain, SOB, and onset of bradycardia in the low-mid 40s. She received SL NTG with only transient relief. Her blood pressure would not allow for further aggressive med titration. She was taken back to the cath lab as above without culprit lesion for recurrence of anginal pain, suspect possible residual downstream occlusion from initial PCI on 07/11/2019. She had normal LVEDP. Her carvedilol was definitively discontinued.  She was observed overnight and initially felt well this morning. Dr. [name] recommended to consolidate her isosorbide to '120mg'$  once daily. She also got her hydralazine '25mg'$ . Late morning when she went to walk with cardiac rehab, she complained of feeling weak. She did ambulate fairly steady with slow pace. She reported some dizziness and was noted to have a blood pressure of 86/54. This has come up without intervention to 109/47. Hgb is stable this morning. We have therefore decided to discontinue hydralazine and change Imdur back to '60mg'$  BID. The patient feels better this afternoon and wishes to go home. Dr. [name] has seen and examined the patient today and feels she is stable for discharge. I spoke with patient's daughter [name] who indicates son Levi Aland is living with his mom at her house currently, and [name] will also join  them later this week to assist in her care. She inquired about continuation of home PT so I have placed this order. TOC f/u has been arranged - previously had appt scheduled with L. Ingold later this month but we have moved it up. (No care team availability, and has seen Mickel Baas before.) She will continue dialysis on schedule tomorrow, TTS. Of note, regarding metrics, EF not assessed this admission as it would not change acute management - LVEDP was elevated by initial cath so did not want additional dye load. Can consider echo as OP if appropriate, but given pandemic and clinical stability, she was felt suitable for DC by MD.  Did the patient have an acute coronary syndrome (MI, NSTEMI, STEMI, etc) this admission?:  Yes                               AHA/ACC Clinical Performance & Quality Measures: 6. Aspirin prescribed? - Yes 7. ADP Receptor Inhibitor (Plavix/Clopidogrel,  Brilinta/Ticagrelor or Effient/Prasugrel) prescribed (includes medically managed patients)? - Yes 8. Beta Blocker prescribed? - No - bradycardia 9. High Intensity Statin (Lipitor 40-'80mg'$  or Crestor 20-'40mg'$ ) prescribed? - Yes 10. EF assessed during THIS hospitalization? - No - No - Reason:  LVEF known to be low, would not change management, LVEDP was elevated and MD likely did not want to add additional dye load - can obtain as outpatient given the Covid-19 pandemic/high census, although may not change management 11. For EF <40%, was ACEI/ARB prescribed? - No - hyperkalemia/renal failure 12. For EF <40%, Aldosterone Antagonist (Spironolactone or Eplerenone) prescribed? - No - Reason:  hyperkalemia/renal failure 13. Cardiac Rehab Phase II ordered (Included Medically managed Patients)? - Yes   _____________  Discharge Vitals Vital Signs. BP (!) 109/47   Pulse 82   Temp 98.3 F (36.8 C) (Oral)   Resp 15   Ht '5\' 2"'$  (1.575 m)   Wt 75.2 kg   SpO2 99%   BMI 30.32 kg/m  General: Well developed elderly AAF in no acute  distress. Head: Normocephalic, atraumatic, sclera non-icteric, no xanthomas, nares are without discharge. Neck: Negative for carotid bruits. JVP not elevated. Lungs: Mild crackles at bases, otherwise clear bilaterally to auscultation without wheezes, rales, or rhonchi. Breathing is unlabored. Heart: RRR S1 S2 without murmurs, rubs, or gallops.  Abdomen: Soft, non-tender, non-distended with normoactive bowel sounds. No rebound/guarding. Extremities: No clubbing or cyanosis. No edema. Distal pedal pulses are 2+ and equal bilaterally. Right groin cath site without hematoma, ecchymosis, or bruit. L upper arm dialysis access. Neuro: Alert and oriented X 3. Moves all extremities spontaneously. Psych:  Responds to questions appropriately with a normal affect.   Labs & Radiologic Studies    CBC Recent Labs    07/11/19 1411 07/12/19 0301 07/12/19 0840 07/13/19 0235  WBC 5.7 6.7 5.6 5.3  NEUTROABS 3.5 4.0  --   --   HGB 15.1* 13.8 13.6 13.3  HCT 51.0* 44.5 44.3 42.1  MCV 101.4* 96.7 97.1 94.8  PLT 231 210 178 465   Basic Metabolic Panel Recent Labs    07/12/19 0302 07/12/19 0845  NA 135 135  K 6.3* 4.7  CL 99 96*  CO2 25 25  GLUCOSE 102* 157*  BUN 40* 20  CREATININE 7.30* 4.88*  CALCIUM 8.8* 8.7*  PHOS 4.7* 3.4   Liver Function Tests Recent Labs    07/11/19 1411 07/12/19 0302 07/12/19 0845  AST 41  --   --   ALT 27  --   --   ALKPHOS 86  --   --   BILITOT 0.7  --   --   PROT 7.3  --   --   ALBUMIN 3.4* 3.1* 3.2*   No results for input(s): LIPASE, AMYLASE in the last 72 hours. High Sensitivity Troponin:   Recent Labs  Lab 06/21/19 1403 07/11/19 1411 07/12/19 0046 07/12/19 0840 07/12/19 1111  TROPONINIHS 8,971* 301* 2,721* 3,107* 2,689*    Fasting Lipid Panel Recent Labs    07/12/19 0845  CHOL 134  HDL 52  LDLCALC 66  TRIG 78  CHOLHDL 2.6   Thyroid Function Tests No results for input(s): TSH, T4TOTAL, T3FREE, THYROIDAB in the last 72 hours.  Invalid  input(s): FREET3 _____________  CARDIAC CATHETERIZATION  Result Date: 07/12/2019  ------ NATIVE CORONARIES -------  Dist LM to Prox LAD lesion is 30% stenosed.  Mid LAD-1 lesion is 95% stenosed. Mid LAD-2 lesion is 100% stenosed.  Ost Cx to Mid Cx lesion is  100% stenosed. Ramus lesion is 99% stenosed.  Prox RCA lesion is 70% stenosed. Prox RCA to Mid RCA lesion is 90% stenosed. Mid RCA lesion is 95% stenosed. Dist RCA lesion is 100% stenosed with 100% stenosed side branch in RPAV.  Some of the distal RCA branches are filled via left to right collaterals  ------ GRAFTS ------  LIMA graft was not injected as it was widely patent on January 4  SVG-dRCA graft was not visualized -- known occlusion.  SVG-OM1 is large. Previously placed Mid Graft to Insertion stent (2 previous overlapping drug-eluting stents covered with a third drug-eluting stent) is widely patent.  SVG-RI is large. Previously placed Origin to Prox Graft stent (drug-eluting stent) is widely patent. (There is mild competitive flow)  ------------  LV end diastolic pressure is normal.  Mid Graft to Dist Graft lesion is 100% stenosed.  SUMMARY  Widely patent SVG-OM stent placed on 07/11/2019 with no residual stenosis.  Also widely patent SVG-RI with previous stent placed in July 2020  Stable occlusive native CAD with known occlusion of native RCA, LAD and circumflex.  Severe disease of the proximal RI.  Known patent LIMA-LAD and occluded SVG-RCA.  Normal LVEDP  No culprit lesion for recurrence of anginal pain.  Suspect possible residual downstream occlusion from initial PCI on 07/11/2019 RECOMMENDATIONS  Continue current medications, anticipate possible discharge tomorrow if stable.  Continue medical management of existing CAD. [name], MD  CARDIAC CATHETERIZATION  Result Date: 07/12/2019  Mid RCA lesion is 100% stenosed.  Origin to Prox Graft lesion is 100% stenosed.  Ost LM to Mid LM lesion is 60% stenosed.  Ost Cx to Prox  Cx lesion is 100% stenosed.  Ost LAD to Prox LAD lesion is 99% stenosed.  Prox LAD to Mid LAD lesion is 100% stenosed.  Dist Graft to Insertion lesion is 99% stenosed.  Previously placed Origin to Prox Graft stent (unknown type) is widely patent.  A drug-eluting stent was successfully placed.  Post intervention, there is a 0% residual stenosis.  Monica [name] is a 86 y.o. female  [id number] LOCATION:  FACILITY: [facility] PHYSICIAN: Quay Burow, M.D. 05/27/1931 DATE OF PROCEDURE:  07/11/2019 DATE OF DISCHARGE: CARDIAC CATHETERIZATION / PCI DES SVG RI (ISR) History obtained from chart review.Aprel Egelhoff is a 86 y.o. female with a hx of CAD (s/p CABG 2017, NSTEMI 01/2019 with PCI), chronic combined CHF, moderate MR, anemia, bradycardia/sinus node dysfunction, ESRD on HD, GERD and DM2who is being seen today for the evaluation of chest painat the request of Dr, Training and development officer.  Her last cardiac catheterization was performed in July and her stents were patent.  Her EF is in the 30 to 35% range.  She has had chest pain for last several weeks worse today with an abnormal EKG and positive high sensitive troponins.  She did dialyze yesterday and has a potassium of 6.2 today.  Because of ongoing chest pain, positive enzymes and abnormal EKG she was brought to the Cath Lab semiurgently for angiography potential intervention. PROCEDURE DESCRIPTION: The patient was brought to the second floor [facility] Cardiac cath lab in the postabsorptive state.  She was not premedicated .  Her right groin was prepped and shaved in usual sterile fashion. Xylocaine 1% was used for local anesthesia. A 5 French sheath was inserted into the right common femoral  artery using standard Seldinger technique.  5 French right left Judkins diagnostic catheters (JL 5) and 5 French pigtail catheter used for selective coronary angiography, selective vein graft  and IMA graft angiography and obtain left heart pressures.  Omnipaque dye was used for the entirety of the  case.  Retrograde aorta, left ventricular and pullback pressures were recorded.  (LVEDP was 30). The patient received Angiomax bolus followed by infusion with a therapeutic ACT.  She received an additional 600 mg of p.o. Plavix since she had not taken her Plavix today.  Isovue dye was used for the entirety of the intervention.  Retroaortic pressures monitored in the case.  Using a 6 [place] AL-1 guide catheter along with a 0.14 Prowater guidewire the area of "in-stent restenosis" within the distal ramus intermedius branch SV G stented segment was crossed with little difficulty.  The entirety of that stent was predilated with a 2 mm x 12 mm balloon.  Following this a 2.5 mm x 28 mm long Synergy drug-eluting stent was then carefully positioned across the previously stented segment and deployed at 16 atm (2.7 mm).  The wire was then withdrawn out of the native vessel and 200 mcg of intragraft nitroglycerin was administered revealing a widely patent distal vessel and patent stented segment.   Ms. Kerce has high-grade in-stent restenosis within the distal ramus intermedius branch SVG stent.  The LIMA to the LAD was patent.  The vein to the obtuse marginal branch was patent as well (proximal stented segment widely patent).  The native right and RCA SVG were known to be occluded.  Her LVEDP was significantly elevated at 30.  She was dialyzed yesterday.  Her serum potassium was 6.2 by i-STAT.  She had successful restenting of the ramus branch in-stent restenosis with a synergy drug-eluting stent with excellent result.  Angiomax was discontinued at the end of the case.  She will need hemodialysis early evening because of her elevated LVEDP and her hyperkalemia.  Both sheaths were sewn securely in place.  The patient left lab in stable condition.  Dr. Stanford Breed was notified of these results. Quay Burow. MD, [facility] 07/11/2019 5:03 PM   DG Chest Port 1 View  Result Date: 07/11/2019 CLINICAL DATA:  Chest pain. EXAM: PORTABLE  CHEST 1 VIEW COMPARISON:  06/21/2019 FINDINGS: Stable cardiac enlargement and evidence of prior CABG. Stable mild interstitial prominence without evidence of overt edema, airspace consolidation, pleural fluid or pneumothorax. IMPRESSION: Stable cardiomegaly. Mild interstitial prominence without overt edema. Electronically Signed   By: Aletta Edouard M.D.   On: 07/11/2019 13:52   DG Chest Port 1 View  Result Date: 06/21/2019 CLINICAL DATA:  Midsternal chest pain. EXAM: PORTABLE CHEST 1 VIEW COMPARISON:  Radiograph 05/31/2019 FINDINGS: Post median sternotomy. Unchanged cardiomegaly. Unchanged mediastinal contours. Unchanged vascular congestion from prior exam. Blunting of the costophrenic angles consistent with small effusions. No pneumothorax. No focal airspace disease. IMPRESSION: Unchanged cardiomegaly, vascular congestion, and small pleural effusions. No acute findings. Electronically Signed   By: Keith Rake M.D.   On: 06/21/2019 05:33   Disposition   Pt is being discharged home today in good condition.  Follow-up Plans & Appointments    Follow-up Information    Isaiah Serge, NP Follow up.   Specialties: Cardiology, Radiology Why: [facility] - see appointment below for 07/20/19 with Cecilie Kicks, who is one of the nurse practitioners with our cardiology team. You have met before. You originally had an appointment scheduled later this month but we moved this up. Contact information: [address] [place] [address] [phone]          Discharge Instructions    Amb  Referral to Cardiac Rehabilitation   Complete by: As directed    To [place]   Diagnosis:  Coronary Stents NSTEMI PTCA     After initial evaluation and assessments completed: Virtual Based Care may be provided alone or in conjunction with Phase 2 Cardiac Rehab based on patient barriers.: Yes   Diet - low sodium heart healthy   Complete by: As directed    Follow instructions given by your  kidney doctors as well for a renal diet   Discharge instructions   Complete by: As directed    Calcium carbonate antacid and camphor/sarna lotion were discontinued from your list since you said you were not using them any longer.  Carvedilol was stopped due to slow heart rate.  Hydralazine was stopped due to low blood pressure.  Your isosorbide dose was changed to '60mg'$  twice daily.   Increase activity slowly   Complete by: As directed    No driving until cleared by your healthcare team. No lifting over 10 lbs for 2 weeks. No sexual activity for 2 weeks. Keep procedure site clean & dry. If you notice increased pain, swelling, bleeding or pus, call/return!  You may shower, but no soaking baths/hot tubs/pools for 1 week.      Discharge Medications   Allergies as of 07/13/2019   No Known Allergies     Medication List    STOP taking these medications   calcium carbonate (dosed in mg elemental calcium) 1250 MG/5ML Susp   camphor-menthol lotion Commonly known as: SARNA   carvedilol 12.5 MG tablet Commonly known as: COREG   hydrALAZINE 25 MG tablet Commonly known as: APRESOLINE     TAKE these medications   acetaminophen 500 MG tablet Commonly known as: TYLENOL Take 1 tablet (500 mg total) by mouth every 6 (six) hours as needed for mild pain or headache.   albuterol 108 (90 Base) MCG/ACT inhaler Commonly known as: VENTOLIN HFA Inhale 2 puffs into the lungs every 6 (six) hours as needed for wheezing or shortness of breath.   allopurinol 100 MG tablet Commonly known as: ZYLOPRIM Take 100 mg by mouth 2 (two) times daily.   Alphagan P 0.1 % Soln Generic drug: brimonidine Place 1 drop into both eyes 2 (two) times daily.   aspirin 81 MG EC tablet Take 1 tablet (81 mg total) by mouth daily.   atorvastatin 80 MG tablet Commonly known as: LIPITOR Take 80 mg by mouth daily at 6 PM.   clopidogrel 75 MG tablet Commonly known as: PLAVIX Take 1 tablet (75 mg total) by mouth  daily.   Cranberry 500 MG Caps Take 1 capsule by mouth daily.   diphenhydrAMINE 25 mg capsule Commonly known as: BENADRYL Take 25 mg by mouth as needed.   ferric citrate 1 GM 210 MG(Fe) tablet Commonly known as: AURYXIA Take 210 mg by mouth 3 (three) times daily with meals.   isosorbide mononitrate 60 MG 24 hr tablet Commonly known as: IMDUR Take 1 tablet (60 mg total) by mouth 2 (two) times daily. What changed:   how much to take  how to take this  when to take this  additional instructions   latanoprost 0.005 % ophthalmic solution Commonly known as: XALATAN Place 1 drop into both eyes at bedtime.   loratadine 10 MG tablet Commonly known as: CLARITIN Take 10 mg by mouth as needed for allergies.   Melatonin 5 MG Caps Take 5 mg by mouth at bedtime.   multivitamin Tabs tablet Take 1  tablet by mouth daily.   nitroGLYCERIN 0.4 MG SL tablet Commonly known as: NITROSTAT Place 1 tablet (0.4 mg total) under the tongue every 5 (five) minutes as needed for chest pain.   pantoprazole 20 MG tablet Commonly known as: PROTONIX Take 20 mg by mouth daily.   PROBIOTIC PO Take 1 tablet by mouth daily. 50 billion cfus   TRESIBA FLEXTOUCH [place] Inject 20 Units into the skin daily.   Vitamin D-3 25 MCG (1000 UT) Caps Take 1,000 Units by mouth daily.          Outstanding Labs/Studies   N/a  Duration of Discharge Encounter   Greater than 30 minutes including physician time.  Signed, Charlie Pitter, PA-C 07/13/2019, 12:24 PM

## 2019-07-13 NOTE — Progress Notes (Addendum)
Patient is feeling much better, BP 117/71, felt good after eating lunch, eager to go home. Right groin groin cath site without hematoma, ecchymosis, or bruit. She asked that I call and speak with Laverne about final med changes which I did. Conferred with nurse that she is OK to be discharged after care mgmt has arranged PT.   Turner Baillie PA-C

## 2019-07-13 NOTE — Progress Notes (Signed)
[facility] Progress Note   Subjective:   S/p 2nd LHC yesterday without intervention.  Feels improved today.  Per chart d/c today.   Objective Vitals:   07/13/19 0738 07/13/19 1114 07/13/19 1145 07/13/19 1200  BP: 124/64 (!) 85/38 (!) 90/51 (!) 109/47  Pulse: 83 82    Resp: 14 15    Temp: 97.6 F (36.4 C) 98.3 F (36.8 C)    TempSrc: Oral Oral    SpO2: 98% 99%    Weight:      Height:       Physical Exam General: elderly woman lying on side comfortably Heart: no rub or S4 appreciated Lungs: clear Abdomen: soft Extremities: no edema Dialysis Access: AVF +T/B, dressing in place  Additional Objective Labs: Basic Metabolic Panel: Recent Labs  Lab 07/12/19 0046 07/12/19 0302 07/12/19 0845  NA 135 135 135  K 6.8*  6.8* 6.3* 4.7  CL 99 99 96*  CO2 21* 25 25  GLUCOSE 128* 102* 157*  BUN 39* 40* 20  CREATININE 7.13* 7.30* 4.88*  CALCIUM 8.7* 8.8* 8.7*  PHOS  --  4.7* 3.4   Liver Function Tests: Recent Labs  Lab 07/11/19 1411 07/12/19 0302 07/12/19 0845  AST 41  --   --   ALT 27  --   --   ALKPHOS 86  --   --   BILITOT 0.7  --   --   PROT 7.3  --   --   ALBUMIN 3.4* 3.1* 3.2*   No results for input(s): LIPASE, AMYLASE in the last 168 hours. CBC: Recent Labs  Lab 07/11/19 1411 07/12/19 0301 07/12/19 0840 07/13/19 0235  WBC 5.7 6.7 5.6 5.3  NEUTROABS 3.5 4.0  --   --   HGB 15.1* 13.8 13.6 13.3  HCT 51.0* 44.5 44.3 42.1  MCV 101.4* 96.7 97.1 94.8  PLT 231 210 178 166   Blood Culture    Component Value Date/Time   SDES URINE, RANDOM 01/18/2019 0748   SPECREQUEST NONE 01/18/2019 0748   CULT (A) 01/18/2019 0748    <10,000 COLONIES/mL INSIGNIFICANT GROWTH Performed at [facility] [address], [place], [place] [address]    REPTSTATUS 01/19/2019 FINAL 01/18/2019 0748    Cardiac Enzymes: Recent Labs  Lab 07/12/19 0845  CKTOTAL 108   CBG: Recent Labs  Lab 07/12/19 1043 07/12/19 2127 07/13/19 0620 07/13/19 0651  07/13/19 1142  GLUCAP 141* 104* 42* 74 110*   Iron Studies: No results for input(s): IRON, TIBC, TRANSFERRIN, FERRITIN in the last 72 hours. _0 @ Studies/Results: CARDIAC CATHETERIZATION  Result Date: 07/12/2019  ------ NATIVE CORONARIES -------  Dist LM to Prox LAD lesion is 30% stenosed.  Mid LAD-1 lesion is 95% stenosed. Mid LAD-2 lesion is 100% stenosed.  Ost Cx to Mid Cx lesion is 100% stenosed. Ramus lesion is 99% stenosed.  Prox RCA lesion is 70% stenosed. Prox RCA to Mid RCA lesion is 90% stenosed. Mid RCA lesion is 95% stenosed. Dist RCA lesion is 100% stenosed with 100% stenosed side branch in RPAV.  Some of the distal RCA branches are filled via left to right collaterals  ------ GRAFTS ------  LIMA graft was not injected as it was widely patent on January 4  SVG-dRCA graft was not visualized -- known occlusion.  SVG-OM1 is large. Previously placed Mid Graft to Insertion stent (2 previous overlapping drug-eluting stents covered with a third drug-eluting stent) is widely patent.  SVG-RI is large. Previously placed Origin to Prox Graft stent (  drug-eluting stent) is widely patent. (There is mild competitive flow)  ------------  LV end diastolic pressure is normal.  Mid Graft to Dist Graft lesion is 100% stenosed.  SUMMARY  Widely patent SVG-OM stent placed on 07/11/2019 with no residual stenosis.  Also widely patent SVG-RI with previous stent placed in July 2020  Stable occlusive native CAD with known occlusion of native RCA, LAD and circumflex.  Severe disease of the proximal RI.  Known patent LIMA-LAD and occluded SVG-RCA.  Normal LVEDP  No culprit lesion for recurrence of anginal pain.  Suspect possible residual downstream occlusion from initial PCI on 07/11/2019 RECOMMENDATIONS  Continue current medications, anticipate possible discharge tomorrow if stable.  Continue medical management of existing CAD. Monica Hew, MD  CARDIAC CATHETERIZATION  Result Date:  07/12/2019  Mid RCA lesion is 100% stenosed.  Origin to Prox Graft lesion is 100% stenosed.  Ost LM to Mid LM lesion is 60% stenosed.  Ost Cx to Prox Cx lesion is 100% stenosed.  Ost LAD to Prox LAD lesion is 99% stenosed.  Prox LAD to Mid LAD lesion is 100% stenosed.  Dist Graft to Insertion lesion is 99% stenosed.  Previously placed Origin to Prox Graft stent (unknown type) is widely patent.  A drug-eluting stent was successfully placed.  Post intervention, there is a 0% residual stenosis.  Monica [name] is a 86 y.o. female  [id number] LOCATION:  FACILITY: [facility] PHYSICIAN: Quay Burow, M.D. [birth year] DATE OF PROCEDURE:  07/11/2019 DATE OF DISCHARGE: CARDIAC CATHETERIZATION / PCI DES SVG RI (ISR) History obtained from chart review.Monica [name] is a 86 y.o. female with a hx of CAD (s/p CABG 2017, NSTEMI 01/2019 with PCI), chronic combined CHF, moderate MR, anemia, bradycardia/sinus node dysfunction, ESRD on HD, GERD and DM2who is being seen today for the evaluation of chest painat the request of Dr, Training and development officer.  Her last cardiac catheterization was performed in July and her stents were patent.  Her EF is in the 30 to 35% range.  She has had chest pain for last several weeks worse today with an abnormal EKG and positive high sensitive troponins.  She did dialyze yesterday and has a potassium of 6.2 today.  Because of ongoing chest pain, positive enzymes and abnormal EKG she was brought to the Cath Lab semiurgently for angiography potential intervention. PROCEDURE DESCRIPTION: The patient was brought to the second floor [facility]iac cath lab in the postabsorptive state.  She was not premedicated .  Her right groin was prepped and shaved in usual sterile fashion. Xylocaine 1% was used for local anesthesia. A 5 French sheath was inserted into the right common femoral  artery using standard Seldinger technique.  5 French right left Judkins diagnostic catheters (JL 5) and 5 French pigtail catheter used for  selective coronary angiography, selective vein graft and IMA graft angiography and obtain left heart pressures.  Omnipaque dye was used for the entirety of the case.  Retrograde aorta, left ventricular and pullback pressures were recorded.  (LVEDP was 30). The patient received Angiomax bolus followed by infusion with a therapeutic ACT.  She received an additional 600 mg of p.o. Plavix since she had not taken her Plavix today.  Isovue dye was used for the entirety of the intervention.  Retroaortic pressures monitored in the case.  Using a 6 [place] AL-1 guide catheter along with a 0.14 [place] guidewire the area of "in-stent restenosis" within the distal ramus intermedius branch SV G stented segment was crossed with little difficulty.  The  entirety of that stent was predilated with a 2 mm x 12 mm balloon.  Following this a 2.5 mm x 28 mm long Synergy drug-eluting stent was then carefully positioned across the previously stented segment and deployed at 16 atm (2.7 mm).  The wire was then withdrawn out of the native vessel and 200 mcg of intragraft nitroglycerin was administered revealing a widely patent distal vessel and patent stented segment.   Ms. Matsumura has high-grade in-stent restenosis within the distal ramus intermedius branch SVG stent.  The LIMA to the LAD was patent.  The vein to the obtuse marginal branch was patent as well (proximal stented segment widely patent).  The native right and RCA SVG were known to be occluded.  Her LVEDP was significantly elevated at 30.  She was dialyzed yesterday.  Her serum potassium was 6.2 by i-STAT.  She had successful restenting of the ramus branch in-stent restenosis with a synergy drug-eluting stent with excellent result.  Angiomax was discontinued at the end of the case.  She will need hemodialysis early evening because of her elevated LVEDP and her hyperkalemia.  Both sheaths were sewn securely in place.  The patient left lab in stable condition.  Dr. Stanford Breed was  notified of these results. Quay Burow. MD, [facility] 07/11/2019 5:03 PM   DG Chest Port 1 View  Result Date: 07/11/2019 CLINICAL DATA:  Chest pain. EXAM: PORTABLE CHEST 1 VIEW COMPARISON:  06/21/2019 FINDINGS: Stable cardiac enlargement and evidence of prior CABG. Stable mild interstitial prominence without evidence of overt edema, airspace consolidation, pleural fluid or pneumothorax. IMPRESSION: Stable cardiomegaly. Mild interstitial prominence without overt edema. Electronically Signed   By: Aletta Edouard M.D.   On: 07/11/2019 13:52   Medications: . sodium chloride    . sodium chloride    . sodium chloride     . aspirin EC  81 mg Oral Daily  . atorvastatin  80 mg Oral q1800  . clopidogrel  75 mg Oral Daily  . feeding supplement (PRO-STAT SUGAR FREE 64)  30 mL Oral BID  . insulin aspart  0-15 Units Subcutaneous TID WC  . insulin aspart  0-5 Units Subcutaneous QHS  . [START ON 07/14/2019] isosorbide mononitrate  60 mg Oral BID  . pantoprazole  20 mg Oral Daily  . sodium chloride flush  3 mL Intravenous Once  . sodium chloride flush  3 mL Intravenous Q12H  . sodium chloride flush  3 mL Intravenous Q12H  . sodium chloride flush  3 mL Intravenous Q12H    Dialysis Orders: Dialysis Orders: Center: [facility]   on TTS . EDW 154 lb ( 70 KG)  HD Bath 3 K/ 2.5 ca   Time 3.5 hr  Heparin NONE. Access LUA AVF  BFR 350  DFR 700     Mircera 130 mcg q 2 wks ( DUE this week)    Units IV/HD   NO VIT D on hd   Assessment/Plan: 1. Unstable angina:  S/p LHC with intervention Mon. 2nd cath due to recurrent CP - no issue  No chest pain currently.  2. ESRD: had HD Tues, per cardiology notes ^LVEDP on cath -- will need EDW challenged outpt.  She can dialyze per home schedule tomorrow if discharged today. 3. HTN/volume:  Normotensive overnight 4. Anemia:  Hb ~13, no ESA.  5. Secondary hyperparathyroidism:   Phos 4.7 here.  Not on VDRA outpt.  6. Nutrition:  Albumin 3.1, start prostat.  7.  Hyperkalemia:  resolved 8. DM 2:  per primary. 9.  H/o combined CHF:  Last echo showed reduced EF 30-35%.  Cardiology following. Per cardiology note pt with elevated LVEDP - will need EDW challenged outpt.    OK to discharge from nephrology perspective  Jannifer Hick MD 07/13/2019, 12:18 PM  [facility] Pager: [phone]

## 2019-07-13 NOTE — Progress Notes (Signed)
Patient IV removed, clean dry and intact. Discharge instructions given to patient. All questions have been answered and patient understands follow up appointments and medication changes.

## 2019-07-13 NOTE — Plan of Care (Signed)

## 2019-07-13 NOTE — Telephone Encounter (Signed)
   Pt anticipated for dc likely later today if feeling OK. I made appt 07/20/19 with L Dorene Ar, needs TOC call- lives with son Artie. Daughter [name] is also point of contact too. I would be OK with her having a visitor at her f/u appt. Thanks, [employer]

## 2019-07-13 NOTE — Consult Note (Signed)
   [facility] CM Inpatient Consult   07/13/2019  Monica [name] 1931/03/26 [id number]  Alerted of the patient's readmission less than 30 days, patient was in the cath lab on 07/12/2019.  Patient has an extreme high risk score for unplanned admissions and patient is in the [place]. Patient is currently active with [facility] for chronic disease management services.  Patient has been engaged by a [place].  Spoke with the patient via hospital telephone.  She denies issues with food, medication, or transportation.  She states that her son will be staying with her and he will take her to appointments.  Primary Care Provider: She endorses Dr. Iona Beard Osei-Bonsu and she plans to follow up with an appointment.   Plan: Following for progress and disposition and follow up needs. Will notify Inpatient Transition Of Care [TOC] team member to make aware that [facility] following.   Of note, [facility] Care Management services does not replace or interfere with any services that are needed or arranged by inpatient [facility] care management team.  For additional questions or referrals please contact:  Natividad Brood, RN BSN [facility] Liaison  [phone] business mobile phone Toll free office [phone]  Fax number: [phone] [email or web address]

## 2019-07-13 NOTE — Progress Notes (Signed)
CARDIAC REHAB PHASE I   PRE:  Rate/Rhythm: 87 SR    BP: lying 104/41, sitting 110/69    SaO2: 98 RA  MODE:  Ambulation: 180 ft   POST:  Rate/Rhythm: 86 SR    BP: sitting 86/54, lying 85/38     SaO2: 98 RA  Pt c/o feeling weak today. Has been up but lying in bed on my arrival. Willing to walk. Used RW (normally uses cane at home). Fairly steady, slow pace. Continued c/o weakness and also added dizziness while walking. She did want to walk entire length of hallway but on return to room BP even lower, 86/54. Pt lied down but BP still low at 85/38. Notified RN. Encouraged pt to eat well and sit in recliner what she can. She lives alone so does not appear ready for d/c. Gave her MI book and stent card. Will refer to [place], ACSM 07/13/2019 11:27 AM

## 2019-07-13 NOTE — TOC Initial Note (Signed)
Transition of Care [facility]) - Initial/Assessment Note    Patient Details  Name: Monica [name] MRN: [id number] Date of Birth: 1930/09/04  Transition of Care [facility]) CM/SW Contact:    Zenon Mayo, RN Phone Number: 07/13/2019, 12:40 PM  Clinical Narrative:                 From home alone, for dc today, needs HHPT, NCM offered choice, she states she has no preference, NCM informed her that [place] now would that be ok with her she states this is fine.  Referral given to [facility] with [facility] for [place].  Soc will begin 24 to 48 hrs post dc. [place] [facility] states they should be able to take this referral.   Expected Discharge Plan: [place] Barriers to Discharge: No Barriers Identified   Patient Goals and CMS Choice Patient states their goals for this hospitalization and ongoing recovery are:: get better CMS Medicare.gov Compare Post Acute Care list provided to:: Patient Choice offered to / list presented to : Patient  Expected Discharge Plan and Services Expected Discharge Plan: [place] In-house Referral: NA Discharge Planning Services: CM Consult Post Acute Care Choice: [place] arrangements for the past 2 months: Single Family Home                 DME Arranged: (NA)         HH Arranged: PT HH Agency: [facility] [employer] Date [place]: 07/13/19 Time [place]: 24 Representative spoke with at [place]: [place] Arrangements/Services Living arrangements for the past 2 months: [place] Lives with:: Self Patient language and need for interpreter reviewed:: Yes Do you feel safe going back to the place where you live?: Yes      Need for Family Participation in Patient Care: No (Comment) Care giver support system in place?: No (comment)   Criminal Activity/Legal Involvement Pertinent to Current Situation/Hospitalization: No - Comment as needed  Activities  of Daily Living Home Assistive Devices/Equipment: Cane (specify quad or straight), Eyeglasses ADL Screening (condition at time of admission) Patient's cognitive ability adequate to safely complete daily activities?: Yes Is the patient deaf or have difficulty hearing?: No Does the patient have difficulty seeing, even when wearing glasses/contacts?: No Does the patient have difficulty concentrating, remembering, or making decisions?: Yes Patient able to express need for assistance with ADLs?: Yes Does the patient have difficulty dressing or bathing?: No Independently performs ADLs?: Yes (appropriate for developmental age) Does the patient have difficulty walking or climbing stairs?: Yes Weakness of Legs: Both Weakness of Arms/Hands: None  Permission Sought/Granted                  Emotional Assessment   Attitude/Demeanor/Rapport: Engaged Affect (typically observed): Appropriate Orientation: : Oriented to Self, Oriented to Place, Oriented to  Time, Oriented to Situation      Admission diagnosis:  Non-ST elevation (NSTEMI) myocardial infarction [facility]) [I21.4] NSTEMI (non-ST elevated myocardial infarction) [facility]) [I21.4] Patient Active Problem List   Diagnosis Date Noted  . Coronary artery disease, occlusive: Occluded native vessels, graft dependent 07/12/2019  . NSTEMI (non-ST elevated myocardial infarction) ([place]) 06/21/2019  . Acute on chronic combined systolic and diastolic CHF (congestive heart failure) ([place]) 03/18/2019  . Anemia of chronic disease 03/17/2019  . ESRD on hemodialysis ([place]) 03/17/2019  . Sinus node dysfunction ([place]) 01/25/2019  . Ischemic cardiomyopathy 01/25/2019  . Hyperkalemia 01/18/2019  .  Acute lower UTI 01/18/2019  . Elevated troponin   . Junctional bradycardia   . Bradycardia 01/17/2019  . S/P coronary artery stent placement   . Acute combined systolic and diastolic heart failure ([place])   . Non-ST elevation (NSTEMI) myocardial infarction ([place])  01/08/2019  . Acute respiratory failure ([place]) 01/07/2019  . Diabetes mellitus with peripheral circulatory disorder ([place]) 02/05/2018  . Altered mental status 03/13/2017  . Claudication in peripheral vascular disease ([place]) 12/29/2016  . Abnormal weight loss 06/05/2016  . Bilateral pleural effusion 11/23/2015  . Chronic combined systolic and diastolic CHF (congestive heart failure) ([place]) 11/23/2015  . Dyspnea 11/23/2015  . Pleural effusion 11/23/2015  . Coronary artery disease involving coronary bypass graft of native heart with angina pectoris ([place]) 10/05/2015  . S/P CABG x 4 09/19/2015  . History of coronary artery bypass surgery 09/19/2015  . Prolonged Q-T interval on ECG 09/17/2015  . Angina pectoris ([place])   . Gastroesophageal reflux disease 09/15/2015  . Gout 09/15/2015  . Hyperlipidemia   . Diabetes mellitus with renal complications ([place])   . Obesity (BMI 30.0-34.9) 08/02/2015  . Asthma 04/27/2015  . Vitamin D deficiency 04/27/2015  . Hyperlipidemia associated with type 2 diabetes mellitus ([place]) 04/27/2015  . Essential hypertension 04/24/2015  . ESRD (end stage renal disease) ([place]) 04/24/2015  . Type 2 diabetes mellitus with hyperglycemia ([place]) 04/24/2015  . Metatarsalgia of both feet 07/25/2014  . Equinus deformity of foot, acquired 07/25/2014  . Onychomycosis 07/25/2014  . Pain in lower limb 07/25/2014  . Acquired equinus deformity of foot 07/25/2014  . Metatarsalgia 07/25/2014  . Pain of lower extremity 07/25/2014   PCP:  Benito Mccreedy, MD Pharmacy:   [facility] [phone] - [place] [address] [place] [address] Phone: [phone] Fax: [place], [place] [address] [place] [address] Phone: [phone] Fax: [phone]  [facility] [place], [place]  [address] Phone: [phone] Fax: [phone]     Social Determinants of Health (SDOH) Interventions    Readmission Risk Interventions Readmission Risk Prevention Plan 07/13/2019 03/19/2019  Transportation Screening Complete Complete  Medication Review Press photographer) Complete Complete  PCP or Specialist appointment within 3-5 days of discharge Complete Complete  HRI or Home Care Consult Complete Complete  SW Recovery Care/Counseling Consult Complete Complete  Palliative Care Screening Not Applicable Not [place] Not Applicable Not Applicable  Some recent data might be hidden

## 2019-07-14 DIAGNOSIS — D631 Anemia in chronic kidney disease: Secondary | ICD-10-CM | POA: Diagnosis not present

## 2019-07-14 DIAGNOSIS — E119 Type 2 diabetes mellitus without complications: Secondary | ICD-10-CM | POA: Diagnosis not present

## 2019-07-14 DIAGNOSIS — N186 End stage renal disease: Secondary | ICD-10-CM | POA: Diagnosis not present

## 2019-07-14 DIAGNOSIS — E785 Hyperlipidemia, unspecified: Secondary | ICD-10-CM | POA: Diagnosis not present

## 2019-07-14 DIAGNOSIS — N2581 Secondary hyperparathyroidism of renal origin: Secondary | ICD-10-CM | POA: Diagnosis not present

## 2019-07-14 DIAGNOSIS — M109 Gout, unspecified: Secondary | ICD-10-CM | POA: Diagnosis not present

## 2019-07-14 NOTE — Telephone Encounter (Signed)
**Note De-Identified  Obfuscation** Patient Daughter Gregary Signs contacted regarding the pts discharge from [facility] on 07/13/2019.  Gregary Signs understands that the pt is to follow up with provider Cecilie Kicks, NP on 07/20/2019 at 3:30 at [facility] in [place], [place] [address]. Gregary Signs understands the pts discharge instructions? Yes Gregary Signs understands the pts medications and regiment? Yes Gregary Signs understands to bring all medications to this visit? Yes  Per Dayna Dunn's recommendation in this message I did add a note in the pts appt notes stating that it is ok for the pt to bring someone with her to this OV.

## 2019-07-15 ENCOUNTER — Telehealth: Payer: Self-pay | Admitting: Cardiology

## 2019-07-15 ENCOUNTER — Other Ambulatory Visit: Payer: Self-pay

## 2019-07-15 DIAGNOSIS — U071 COVID-19: Secondary | ICD-10-CM | POA: Diagnosis not present

## 2019-07-15 DIAGNOSIS — E1122 Type 2 diabetes mellitus with diabetic chronic kidney disease: Secondary | ICD-10-CM | POA: Diagnosis not present

## 2019-07-15 DIAGNOSIS — I252 Old myocardial infarction: Secondary | ICD-10-CM | POA: Diagnosis not present

## 2019-07-15 DIAGNOSIS — I132 Hypertensive heart and chronic kidney disease with heart failure and with stage 5 chronic kidney disease, or end stage renal disease: Secondary | ICD-10-CM | POA: Diagnosis not present

## 2019-07-15 DIAGNOSIS — E1151 Type 2 diabetes mellitus with diabetic peripheral angiopathy without gangrene: Secondary | ICD-10-CM | POA: Diagnosis not present

## 2019-07-15 DIAGNOSIS — N186 End stage renal disease: Secondary | ICD-10-CM | POA: Diagnosis not present

## 2019-07-15 DIAGNOSIS — D631 Anemia in chronic kidney disease: Secondary | ICD-10-CM | POA: Diagnosis not present

## 2019-07-15 DIAGNOSIS — I5042 Chronic combined systolic (congestive) and diastolic (congestive) heart failure: Secondary | ICD-10-CM | POA: Diagnosis not present

## 2019-07-15 DIAGNOSIS — I2510 Atherosclerotic heart disease of native coronary artery without angina pectoris: Secondary | ICD-10-CM | POA: Diagnosis not present

## 2019-07-15 NOTE — Telephone Encounter (Signed)
New Message    Monica [name] is calling and is requesting verbal orders to continue home health  Once a week for 8 more weeks    Please advise

## 2019-07-15 NOTE — Telephone Encounter (Signed)
Spoke with Dorothea Ogle at home health care and informed him that Dr. [name] gives him the verbal order for the pt to continue home health for 8 more weeks, for she just recently was admitted and had a stent placed.  Informed Dorothea Ogle that Dr. [name] will be out of the office for the next week, but she will be glad to sign faxed orders needed, upon return to the office.  Mordecai Maes that the pt should keep her TCM appt with our office for 1/13 with Cecilie Kicks NP.  Dorothea Ogle verbalized understanding and agrees with this plan.  Dorothea Ogle states that she can sign and fax this back on return to the office, for he can take the verbal order.  Dorothea Ogle was gracious for all the assistance provided.

## 2019-07-15 NOTE — Patient Outreach (Signed)
[facility]) Care Management  07/15/2019  Monica [name] 1930/07/08 [id number]     Transition of Care Referral  Referral Date:07/14/2019  Referral Source: Discharge Report Date of Admission: 07/11/2019 Diagnosis: "chest pain" Date of Discharge: 07/13/2019 Facility: [facility]    Outreach attempt # 1 to patient. She voices that she is doing well. She denies any chest pain or other acute symptoms since returning home. Son remains in the home and assists her as needed. She voices that her daughter remains in [place] but has been communicating back and forth with her medical team and knows what is going on with her care. She confirms that she has all her meds in the home and no issues or concerns regarding them. She confirms that her MD appts have been scheduled and her son will be taking her to appt. She went yesterday for dialysis treatment and voices that things went well.  [facility] CM Care Plan Problem One     Most Recent Value  Care Plan Problem One  Patient with impaired cardiac status/functioning  Role Documenting the Problem One  Care Management Telephonic Coordinator  Care Plan for Problem One  Active  [facility] Long Term Goal   Patient will report no further abnormal cardiac events over the next 90 days.  [facility] Long Term Goal Start Date  07/04/19  [facility] CM Short Term Goal #1   Patient will report no CP or other cardiac sxs over the next 30 days.  [facility] CM Short Term Goal #1 Start Date  07/15/19  Interventions for Short Term Goal #1  RN CM assessed for any CP or acute issues. RN CM reviewed action plan with pt. RN CM confirmed pt knows when,how and why to seek medical attention.  [facility] CM Short Term Goal #2   Patient will be ab, le to verbalise at least 2-3 s/s of heart attack over the next 30 days.  [facility] CM Short Term Goal #2 Start Date  07/04/19  Interventions for Short Term Goal #2  RNCM continued education to pt about modifiable vs nonmodifiable risk  factors and ways to improve health  [facility] CM Short Term Goal #3  Patient will complete all post discharge appts within the next 30 days.  [facility] CM Short Term Goal #3 Start Date  07/15/19  Interventions for Short Tern Goal #3  RNCM confirmed appts in place and transportation available.    [facility] CM Care Plan Problem Two     Most Recent Value  Care Plan Problem Two  Patient with weakness and decconditioning.  Role Documenting the Problem Two  Care Management Telephonic [place] for Problem Two  Active  Interventions for Problem Two Long Term Goal   RNCM confirmed that services in place.   [facility] Long Term Goal  Patient will report completing HHPT over the next 60 days.  [facility] Long Term Goal Start Date  07/15/19  [facility] CM Short Term Goal #1   Patient will report no falls over the next 30 days.  [facility] CM Short Term Goal #1 Start Date  07/15/19  Interventions for Short Term Goal #2   RN CM completed fall/safety eval. RN CM educated pt on fall/safety measures in the home.       Plan: RN CM discussed with patient next outreach within a week. Patient gave verbal consent and in agreement with RN CM follow up and timeframe. Patient aware that they may contact RN CM sooner for any issues or  concerns.   Enzo Montgomery, RN,BSN,CCM [facility] Telephonic Care Management Coordinator Direct Phone: [phone] Toll Free: [phone] Fax: [phone]

## 2019-07-15 NOTE — Telephone Encounter (Signed)
Yes, I approve! She was just hospitalized and received a stent.

## 2019-07-15 NOTE — Telephone Encounter (Signed)
Tyler with [facility] is calling to see if he can get a verbal order from Dr. [name], for this pt to have 8 more weeks of home health care, and PT.  Pt is doing so well with PT, and is feeling so much better since being discharged from the hospital where she had a stent placed.  Dorothea Ogle states that the pt has her Son from [place] staying with her, until she is more recovered, but he will soon be going back home.  Pt will otherwise be living alone.  Daughter in [place] is unable to be with the pt, and local daughter is not in the home with the pt either. This is initially why home health care was ordered at recent discharge, because pt would be living home alone.  Dorothea Ogle states if he can obtain a verbal from Dr. [name], he can send the paperwork over for her to sign whenever she's back in the office.   Informed Dorothea Ogle that I will route this request to Dr. [name] to review and advise on, and follow-up with him accordingly thereafter.  Dorothea Ogle verbalized understanding and agrees with this plan.

## 2019-07-16 DIAGNOSIS — D631 Anemia in chronic kidney disease: Secondary | ICD-10-CM | POA: Diagnosis not present

## 2019-07-16 DIAGNOSIS — N2581 Secondary hyperparathyroidism of renal origin: Secondary | ICD-10-CM | POA: Diagnosis not present

## 2019-07-16 DIAGNOSIS — N186 End stage renal disease: Secondary | ICD-10-CM | POA: Diagnosis not present

## 2019-07-16 DIAGNOSIS — E785 Hyperlipidemia, unspecified: Secondary | ICD-10-CM | POA: Diagnosis not present

## 2019-07-19 DIAGNOSIS — I5043 Acute on chronic combined systolic (congestive) and diastolic (congestive) heart failure: Secondary | ICD-10-CM | POA: Diagnosis not present

## 2019-07-19 DIAGNOSIS — N186 End stage renal disease: Secondary | ICD-10-CM | POA: Diagnosis not present

## 2019-07-19 DIAGNOSIS — Z23 Encounter for immunization: Secondary | ICD-10-CM | POA: Diagnosis not present

## 2019-07-19 DIAGNOSIS — J45909 Unspecified asthma, uncomplicated: Secondary | ICD-10-CM | POA: Diagnosis not present

## 2019-07-20 ENCOUNTER — Ambulatory Visit: Payer: Medicare HMO | Admitting: Cardiology

## 2019-07-20 ENCOUNTER — Encounter: Payer: Self-pay | Admitting: Cardiology

## 2019-07-20 ENCOUNTER — Other Ambulatory Visit: Payer: Self-pay

## 2019-07-20 VITALS — BP 140/68 | HR 80 | Ht 62.0 in | Wt 157.0 lb

## 2019-07-20 DIAGNOSIS — I25810 Atherosclerosis of coronary artery bypass graft(s) without angina pectoris: Secondary | ICD-10-CM | POA: Diagnosis not present

## 2019-07-20 DIAGNOSIS — I214 Non-ST elevation (NSTEMI) myocardial infarction: Secondary | ICD-10-CM

## 2019-07-20 DIAGNOSIS — E1151 Type 2 diabetes mellitus with diabetic peripheral angiopathy without gangrene: Secondary | ICD-10-CM

## 2019-07-20 DIAGNOSIS — R001 Bradycardia, unspecified: Secondary | ICD-10-CM

## 2019-07-20 DIAGNOSIS — N186 End stage renal disease: Secondary | ICD-10-CM | POA: Diagnosis not present

## 2019-07-20 DIAGNOSIS — I5042 Chronic combined systolic (congestive) and diastolic (congestive) heart failure: Secondary | ICD-10-CM | POA: Diagnosis not present

## 2019-07-20 DIAGNOSIS — D631 Anemia in chronic kidney disease: Secondary | ICD-10-CM | POA: Diagnosis not present

## 2019-07-20 DIAGNOSIS — I132 Hypertensive heart and chronic kidney disease with heart failure and with stage 5 chronic kidney disease, or end stage renal disease: Secondary | ICD-10-CM | POA: Diagnosis not present

## 2019-07-20 DIAGNOSIS — I10 Essential (primary) hypertension: Secondary | ICD-10-CM | POA: Diagnosis not present

## 2019-07-20 DIAGNOSIS — I255 Ischemic cardiomyopathy: Secondary | ICD-10-CM

## 2019-07-20 DIAGNOSIS — U071 COVID-19: Secondary | ICD-10-CM | POA: Diagnosis not present

## 2019-07-20 DIAGNOSIS — I252 Old myocardial infarction: Secondary | ICD-10-CM | POA: Diagnosis not present

## 2019-07-20 DIAGNOSIS — I2510 Atherosclerotic heart disease of native coronary artery without angina pectoris: Secondary | ICD-10-CM | POA: Diagnosis not present

## 2019-07-20 DIAGNOSIS — E1122 Type 2 diabetes mellitus with diabetic chronic kidney disease: Secondary | ICD-10-CM | POA: Diagnosis not present

## 2019-07-20 NOTE — Patient Instructions (Addendum)
Medication Instructions:   Your physician recommends that you continue on your current medications as directed. Please refer to the Current Medication list given to you today.  *If you need a refill on your cardiac medications before your next appointment, please call your pharmacy*  Lab Work: [place]   If you have labs (blood work) drawn today and your tests are completely normal, you will receive your results only by: Marland Kitchen MyChart Message (if you have MyChart) OR . A paper copy in the mail If you have any lab test that is abnormal or we need to change your treatment, we will call you to review the results.  Testing/Procedures: NONE ORDERED  TODAY    Follow-Up: At [facility], you and your health needs are our priority.  As part of our continuing mission to provide you with exceptional heart care, we have created designated Provider Care Teams.  These Care Teams include your primary Cardiologist (physician) and Advanced Practice Providers (APPs -  Physician Assistants and Nurse Practitioners) who all work together to provide you with the care you need, when you need it.  Your next appointment:   2-3 week(s)  The format for your next appointment:   In Person  Provider:   You may see Ena Dawley, MD or one of the following Advanced Practice Providers on your designated Care Team:    Melina Copa, PA-C  Ermalinda Barrios, PA-C   Other Instructions  COVID-19 Vaccine Information can be found at: [email or web address] For questions related to vaccine distribution or appointments, please email [email or web address] or call [phone].   THINGS TO MONITOR WHEN TAKING BLOOD PRESSURE RANGES ARE FOR TOP NUMBER SYSTOLIC:  1.  TO RUN BETWEEN 110-130   WHEN MONITORING HEART RATE:  1. RANGES 52 AND HIGHER   IF YOU ARE RUNNING HIGHER THAN THESE RANGES FOR BLOOD PRESSURE AND LOWER THAN RANGES FOR HEART RATE  FOR   2  OR MORE DAYS:   PLEASE CONTACT CLINIC FOR FURTHER MANAGEMENT.

## 2019-07-20 NOTE — Progress Notes (Signed)
Cardiology Office Note   Date:  07/20/2019   ID:  Monica [name], DOB Jan 28, 1931, MRN [id number]  PCP:  Benito Mccreedy, MD  Cardiologist:  Dr. [name]    Chief Complaint  Patient presents with  . Hospitalization Follow-up      History of Present Illness: Monica [name] is a 86 y.o. female who presents for post hospital.   Her past medical history significant for CAD (s/p CABG 2017,NSTEMI7/2020 with PCI),chronic combined CHF, moderate mitral regurgitation, anemia, bradycardia/sinus node dysfunction, ESRD on HDTThS,GERD, hyperlipidemia and diabetes type 2.  The patient was admitted to the hospital in early July forNSTEMI([id number]) 01/07/2019 with cath showing occluded/atretic SVG to the distal RCA, severe proximal to stent and in-stent restenosis of SVG to OM with successful PTCA/with overlapping DES stent and successful DES to the ostial proximal SVG to the RI.Echo showed new reduced LVEF 30 to 35% with moderately dilated right and left atrium moderate MR and mild AI. She was placed on ASA, Plaix and metoprolol. She was readmitted in mid-July with fatigue and dizziness with junctional bradycardia HR in the 40s, therefore metoprolol was discontinued. She was then again readmitted 02/10/19 with chest pain and elevated troponin (peak 264). Nuclear stress test was abnormal, prompting repeat cath with findings above - felt to be stable disease, no focal targets for PCI. Imdur was titrated with consideration for addition of Ranexa although it does not appear this medicine has been studied in dialysis patients.She wasre-trialedon low dose carvedilolfor antianginal effect.She is not on ACEI/ARB/ARNI/spiro due to renal dysfunction. Last labs 02/2019 showed Hgb 10.6, LDL 63, K 4.5, Cr 4.61, TSH wnl.  At office visit 02/18/2019 the patient reported having felt well after discharge but during a dialysis session the prior day she had an episode of chest pain and EMS was called. Blood pressure  was reportedly 198 from dialysis notes. The patient took 2 sublingual nitroglycerin with relief and declined to be transported to the hospital.She went home and rested and felt better. Blood pressure remained above goal. Imdur and hydralazine were increased. It was noted that at some point amlodipine was stopped, unclear reason.  The patient was recently seen in the office on 02/25/2019 by Cecilie Kicks, NP for follow-up. It was noted that her blood pressure was improved at times. She was still having some chest pain with dialysis at the end due to elevated blood pressure. It was noted that the patient was not taking any medications prior to dialysis so her blood pressure would run high during sessions. Nephrology had decreased how much fluid they were removing and the patient stated that she felt a lot better. Blood pressure was still elevated at this office visit so Imdur was increased to 120 mg daily. Carvedilol was increased to 12.5 mg BID.  The patient return 03/15/19 for close follow-up of blood pressure and chest painwith her daughter. She is unable to take her BP meds prior to dialysis due to dropping BP with dialysis. She is taking her hydralazine right after dialysis. Her BP has been up and down but daughter reports that her BP is a little more regulated.   On follow up visit she was having spells of shortness of breath, getting progressively worse. Her chest feels heavy with the shortness of breath but no chest pain like prior to her stents. She had to have oxygen at dialysis this morning. She has tried using her inhaler and her daughter says that she uses it over and over in trying to catch her  breath. The patient denies any wheezing. This usually occurs on the mornings prior to dialysis. If she rushes it is worse. She tries to pace herself. On Saturday she had an episode after dialysis. Around 8/29 they did a CXR after dialysis to ensure that they were taking enough fluid off and her  daughter says they told her that they were. Her PCP has told her that the shortness of breath is not due to asthma. Her daughter has a pulse oximeter and says that her pulse ox drops from the upper 90s to the lower 90s when she is having difficulty breathing, but does not go below 91%. No longer having palpitations as she was having previously.   Event monitor.:  Sinus bradycardia to sinus rhythm. 1. AVB.  Few short runs of nsVT, the longest lasting 6 beats.  No pauses or atrial fibrillation.   She was hospitalized 03/17/19 for volume overload, her troponins HS were in the 51 and 27  She had dialysis extra day and her imdur she was to take half prior to dialysis.   Since discharge,  She has done well with no SOB or chest pain.  Weight is slightly up from discharge.  It may be she is having more volume removed.  Lower EDW for post dialysis. So now BP is lower.  She was symptomatic with lower BP to 0000000 systolic this AM and she feels bad after taking her meds after dialysis.       In Oct was doing well stable BP and completing dialysis.   05/31/19 chest pain with dialysis and BP elevated 1 sl NTG with relief.  Not admitted  06/21/19 ER for chest pain after dialysis + EKG changes, pk hs troponn [id number] Dr. Tamala Julian saw and felt it was restenosis in VG to OM which supplies collaterals to PDA - treated medically at pt request with plan for cath if pain returned.  Dr Tamala Julian did discuss palliative care and hospice which was not well rece'd  06/27/19 follow up was stable  07/11/19 to ER with angina, woke with pain her dialysis was the day before. With recurrent chest pain despite medical therapy electrocardiogram showed marked ST depression in the inferolateral leads. FU ECG showed junctional rhythm, left axis deviation, improvement in ST depression, + COVID test 06/21/19  Troponin to 3,107   1/4/212 she had cath and high-grade in-stent restenosis within the distal ramus intermedius branch SVG stent.  The  LIMA to the LAD was patent.  The vein to the obtuse marginal branch was patent as well (proximal stented segment widely patent).  The native right and RCA SVG were known to be occluded.  She had successful restenting of the ramus branch in-stent restenosis with a synergy drug-eluting stent with excellent result.  Angiomax was discontinued at the end of the case 07/12/19 had HR in the 40s and recurrent chest pain and back to the cath lab.  It showed widely patent SVG to OM stent placed day before with no residual stenosis no other new lesions, suspect possible residual downstream occlusion from initial PCI.  BPs from Home.   Saturday 1/9 3pm 115/47 HR 77 Saturday 1/9 3:43pm 117/63 HR 83 Saturday 1/9 9:05pm 119/77 HR 85 Sunday 1/10 3:44pm 121/50 HR 53 Sunday 1/10 4:09pm 114/56 HR 58 Sunday 1/10 5:10pm 127/67 HR ? Monday 1/11 2:48pm 134/54 HR 46   O2 98   Monday 1/11 3:24pm 123/49 HR 44   O2 97   Monday 1/11 4:50pm 141/63 HR 50  O2 95    Today she looks really good.  She has not had any chest pain. She has not done much activity, I asked her not to vacuum again, her son lives with her.  She will begin walking 5 min per day and increase by 1 min per day.  No problems with dialysis.  occ her BP is lower but overall stable.    No edema, taking fluid off well with dialysis. No lightheadedness or dizziness. Her HR does drop to 44 at times, but rare has done in the past and no longer on BB.  With her lower BP no longer on hydralazine.     Past Medical History:  Diagnosis Date  . Anemia   . Asthma   . Chronic combined systolic and diastolic CHF (congestive heart failure) ([place])    a. Echo 11/19 [facility]):  EF 55-65, normal wall motion, normal diastolic function. b. Echo 01/2019 EF 30-35%, moderate MR, mild AI.  Marland Kitchen Coronary artery disease    a. s/p CABG 2017. b.s/p NSTEMI 11/19 [facility]) >> S-PDA 100 >>PCI: DES to S-OM. c. NSTEMI 01/2019 - occluded SVG-distal RCA, severe proximal to stent/ISR of  SVG-OM s/p overlapping DES, DES to ostial prox SVG to the RI. d. NSTEMI 07/2019 s/p DES to ramus ISR.  Marland Kitchen COVID-19 virus infection 05/2019  . ESRD (end stage renal disease)    Dialysis Tu, Th, Sa  . Essential hypertension   . Gastroesophageal reflux disease   . History of blood transfusion   . Hyperlipidemia   . Junctional bradycardia   . Moderate mitral regurgitation   . Peptic ulcer   . Protein calorie malnutrition ([place])   . Sinus node dysfunction ([facility])   . Type 2 diabetes mellitus ([place])     Past Surgical History:  Procedure Laterality Date  . ABDOMINAL HYSTERECTOMY    . CARDIAC CATHETERIZATION N/A 09/17/2015   Procedure: Left Heart Cath and Coronary Angiography;  Surgeon: Jettie Booze, MD;  Location: [place] [facility];  Service: Cardiovascular;  Laterality: N/A;  . COLON SURGERY    . CORONARY ARTERY BYPASS GRAFT N/A 09/19/2015   Procedure: CORONARY ARTERY BYPASS GRAFTING (CABG) TIMES FOUR USING BILATARAL SAPHENOUS VEIN GRAFTS AND LEFT INTERNAL MAMMARY ARTERY;  Surgeon: [name], MD;  Location: [place];  Service: Open Heart Surgery;  Laterality: N/A;  . CORONARY STENT INTERVENTION N/A 01/10/2019   Procedure: CORONARY STENT INTERVENTION;  Surgeon: Leonie Man, MD;  Location: [place] [facility];  Service: Cardiovascular;  Laterality: N/A;  . CORONARY STENT INTERVENTION N/A 07/11/2019   Procedure: CORONARY STENT INTERVENTION;  Surgeon: Lorretta Harp, MD;  Location: [place] [facility];  Service: Cardiovascular;  Laterality: N/A;  . LEFT HEART CATH AND CORONARY ANGIOGRAPHY N/A 07/12/2019   Procedure: LEFT HEART CATH AND CORONARY ANGIOGRAPHY;  Surgeon: Leonie Man, MD;  Location: [place] [facility];  Service: Cardiovascular;  Laterality: N/A;  . LEFT HEART CATH AND CORS/GRAFTS ANGIOGRAPHY N/A 01/10/2019   Procedure: LEFT HEART CATH AND CORS/GRAFTS ANGIOGRAPHY;  Surgeon: Leonie Man, MD;  Location: [place] [facility];  Service: Cardiovascular;  Laterality: N/A;  .  LEFT HEART CATH AND CORS/GRAFTS ANGIOGRAPHY N/A 02/11/2019   Procedure: LEFT HEART CATH AND CORS/GRAFTS ANGIOGRAPHY;  Surgeon: Burnell Blanks, MD;  Location: [place] [facility];  Service: Cardiovascular;  Laterality: N/A;  . LEFT HEART CATH AND CORS/GRAFTS ANGIOGRAPHY N/A 07/11/2019   Procedure: LEFT HEART CATH AND CORS/GRAFTS ANGIOGRAPHY;  Surgeon:  Lorretta Harp, MD;  Location: [place] [facility];  Service: Cardiovascular;  Laterality: N/A;  . TEE WITHOUT CARDIOVERSION N/A 09/19/2015   Procedure: TRANSESOPHAGEAL ECHOCARDIOGRAM (TEE);  Surgeon: [name], MD;  Location: [place];  Service: Open Heart Surgery;  Laterality: N/A;     Current Outpatient Medications  Medication Sig Dispense Refill  . acetaminophen (TYLENOL) 500 MG tablet Take 1 tablet (500 mg total) by mouth every 6 (six) hours as needed for mild pain or headache. 30 tablet 0  . albuterol (PROVENTIL HFA;VENTOLIN HFA) 108 (90 Base) MCG/ACT inhaler Inhale 2 puffs into the lungs every 6 (six) hours as needed for wheezing or shortness of breath.    . allopurinol (ZYLOPRIM) 100 MG tablet Take 100 mg by mouth 2 (two) times daily.     Marland Kitchen aspirin EC 81 MG EC tablet Take 1 tablet (81 mg total) by mouth daily.    Marland Kitchen atorvastatin (LIPITOR) 80 MG tablet Take 80 mg by mouth daily at 6 PM.     . brimonidine (ALPHAGAN P) 0.1 % SOLN Place 1 drop into both eyes 2 (two) times daily.    . Cholecalciferol (VITAMIN D-3) 1000 units CAPS Take 1,000 Units by mouth daily.     . clopidogrel (PLAVIX) 75 MG tablet Take 1 tablet (75 mg total) by mouth daily. 90 tablet 1  . Cranberry 500 MG CAPS Take 1 capsule by mouth daily.    . diphenhydrAMINE (BENADRYL) 25 mg capsule Take 25 mg by mouth as needed.    . ferric citrate (AURYXIA) 1 GM 210 MG(Fe) tablet Take 210 mg by mouth 3 (three) times daily with meals.     . Insulin Degludec (TRESIBA FLEXTOUCH [place]) Inject 20 Units into the skin daily.    . isosorbide mononitrate (IMDUR) 60 MG 24 hr tablet Take 1  tablet (60 mg total) by mouth 2 (two) times daily. 60 tablet 6  . latanoprost (XALATAN) 0.005 % ophthalmic solution Place 1 drop into both eyes at bedtime.    Marland Kitchen loratadine (CLARITIN) 10 MG tablet Take 10 mg by mouth as needed for allergies.     . Melatonin 5 MG CAPS Take 5 mg by mouth at bedtime.     . multivitamin (RENA-VIT) TABS tablet Take 1 tablet by mouth daily.    . nitroGLYCERIN (NITROSTAT) 0.4 MG SL tablet Place 1 tablet (0.4 mg total) under the tongue every 5 (five) minutes as needed for chest pain. 25 tablet 12  . pantoprazole (PROTONIX) 20 MG tablet Take 20 mg by mouth daily.    . Probiotic Product (PROBIOTIC PO) Take 1 tablet by mouth daily. 50 billion cfus     No current facility-administered medications for this visit.    Allergies:   Patient has no known allergies.    Social History:  The patient  reports that she has never smoked. She has quit using smokeless tobacco.  Her smokeless tobacco use included snuff. She reports that she does not drink alcohol or use drugs.   Family History:  The patient's family history includes Heart attack in her father.    ROS:  General:no colds or fevers, no weight changes Skin:no rashes or ulcers HEENT:no blurred vision, no congestion CV:see HPI PUL:see HPI GI:no diarrhea constipation or melena, no indigestion GU:no hematuria, no dysuria MS:no joint pain, no claudication Neuro:no syncope, no lightheadedness Endo:+ diabetes, no thyroid disease  Wt Readings from Last 3 Encounters:  07/20/19 157 lb (71.2 kg)  07/12/19 165 lb 12.6 oz (75.2  kg)  06/27/19 159 lb (72.1 kg)     PHYSICAL EXAM: VS:  BP 140/68   Pulse 80   Ht 5\' 2"  (1.575 m)   Wt 157 lb (71.2 kg)   BMI 28.72 kg/m  , BMI Body mass index is 28.72 kg/m. General:Pleasant affect, NAD Skin:Warm and dry, brisk capillary refill HEENT:normocephalic, sclera clear, mucus membranes moist Neck:supple, no JVD, no bruits  Heart:S1S2 RRR without murmur, gallup, rub or  click Lungs:clear without rales, rhonchi, or wheezes [id number], non tender, + BS, do not palpate liver spleen or masses Ext:no lower ext edema, 2+ pedal pulses, 2+ radial pulses Neuro:alert and oriented X 3, MAE, follows commands, + facial symmetry    EKG:  EKG is ordered today. The ekg ordered today demonstrates SR with LVH, T wave inversion V5-6 but improved from the 5th of Jan.    Recent Labs: 05/31/2019: B Natriuretic Peptide 3,301.8 06/21/2019: TSH 4.803 07/11/2019: ALT 27 07/12/2019: BUN 20; Creatinine, Ser 4.88; Potassium 4.7; Sodium 135 07/13/2019: Hemoglobin 13.3; Platelets 166    Lipid Panel    Component Value Date/Time   CHOL 134 07/12/2019 0845   CHOL 132 04/29/2019 0940   TRIG 78 07/12/2019 0845   HDL 52 07/12/2019 0845   HDL 48 04/29/2019 0940   CHOLHDL 2.6 07/12/2019 0845   VLDL 16 07/12/2019 0845   LDLCALC 66 07/12/2019 0845   LDLCALC 68 04/29/2019 0940       Other studies Reviewed: Additional studies/ records that were reviewed today include: .  07/11/19 Cath  Mid RCA lesion is 100% stenosed.  Origin to Prox Graft lesion is 100% stenosed.  Ost LM to Mid LM lesion is 60% stenosed.  Ost Cx to Prox Cx lesion is 100% stenosed.  Ost LAD to Prox LAD lesion is 99% stenosed.  Prox LAD to Mid LAD lesion is 100% stenosed.  Dist Graft to Insertion lesion is 99% stenosed.  Previously placed Origin to Prox Graft stent (unknown type) is widely patent.  A drug-eluting stent was successfully placed.  Post intervention, there is a 0% residual stenosis.   She had high-grade in-stent restenosis within the distal ramus intermedius branch SVG stent.  The LIMA to the LAD was patent.  The vein to the obtuse marginal branch was patent as well (proximal stented segment widely patent).  The native right and RCA SVG were known to be occluded.  Her LVEDP was significantly elevated at 30.  She was dialyzed yesterday.  Her serum potassium was 6.2 by i-STAT.  She had successful  restenting of the ramus branch in-stent restenosis with a synergy drug-eluting stent with excellent result.  Angiomax was discontinued at the end of the case  Diagnostic Dominance: Right  Intervention      07/12/19 repeat chat for chest pain  SUMMARY  Widely patent SVG-OM stent placed on 07/11/2019 with no residual stenosis.  Also widely patent SVG-RI with previous stent placed in July 2020  Stable occlusive native CAD with known occlusion of native RCA, LAD and circumflex.  Severe disease of the proximal RI.  Known patent LIMA-LAD and occluded SVG-RCA.  Normal LVEDP  No culprit lesion for recurrence of anginal pain.  Suspect possible residual downstream occlusion from initial PCI on 07/11/2019  Diagnostic Dominance: Right  Intervention    Echo 01/08/19  IMPRESSIONS    1. The left ventricle has moderate-severely reduced systolic function, with an ejection fraction of 30-35%. The cavity size was normal. There is moderate concentric left ventricular hypertrophy. Left ventricular diastolic  Doppler parameters are  consistent with pseudonormalization. Elevated left atrial and left ventricular end-diastolic pressures.  2. The right ventricle has moderately reduced systolic function. The cavity was normal. There is no increase in right ventricular wall thickness. Right ventricular systolic pressure is normal.  3. Left atrial size was moderately dilated.  4. Right atrial size was mildly dilated.  5. The mitral valve is degenerative. Moderate thickening of the mitral valve leaflet. There is mild mitral annular calcification present. Mitral valve regurgitation is moderate by color flow Doppler.  6. The aortic valve is tricuspid. Mild thickening of the aortic valve. Aortic valve regurgitation is mild by color flow Doppler.  SUMMARY   When compared to the prior study there is now new LV dysfunction with diffuse hypokinesis and LVEF 30-35%, RVEF is also moderately decreased. LVEDP is  elevated.Mitral regurgitation is at least moderate.  FINDINGS  Left Ventricle: The left ventricle has moderate-severely reduced systolic function, with an ejection fraction of 30-35%. The cavity size was normal. There is moderate concentric left ventricular hypertrophy. Left ventricular diastolic Doppler parameters  are consistent with pseudonormalization. Elevated left atrial and left ventricular end-diastolic pressures  ASSESSMENT AND PLAN:  1.  recent NSTEMI first in early Dec and then in Early Jan this year.  Initially treated medically but after discharge she returned with recurrent pain.  And elevated troponin hs again. Had cath and found in stent restenosis of stent to VG that was placed in July within the distal ramus intermedius branch SVG stent. Underwent repeat stent.  - the stents in VG to OM were patent originally placed 06/2018 and 01/24/19 had 2 stents placed for instent restenosis.  On ASA and plavix.  Currently doing well, she will take activity slowly.  She will begin walking.    2.  Hx of CAD and CABG and graft dysfunction.  Significant disease.  Dr. Tamala Julian briefly discussed palliative care but pt/family not interested  Pt appears younger than her stated age and is usually active.  But with in stent stenosis now within prior stents to VGs concerning for continued episodes.  Will monitor. Will see her back in 2 weeks virtually or in person with Dr. [name].   3,  HTN controlled, has been labile in past.  Goal BP [id number] systolic. If remains outside of range for several days and pt symptomatic they will call. If BP low would decrease imdur, not on any other BP meds due to lower BP   4.  Bradycardia.   Junctional rhythm at times.  Off coreg 12.5 BID, she has had this issue off and on.  No BB or rate slowing meds for now.  If HR stays in 40s and pt symptomatic may need to have EP eval.  But out goal is to keep HR >52.  5.  ESRD on HD  Tuesday Thursday and Friday has tolerated well since  discharge.   6.  ICM with EF 30-35% on echo in July.  May consider echo after next visit.  Today euvolemic  7.  HLD on statin with LDL 66 this month no changes in statin.   8. DM-2 last hgb A1c at 9.6 per PCP   Current medicines are reviewed with the patient today.  The patient Has no concerns regarding medicines.  The following changes have been made:  See above Labs/ tests ordered today include:see above  Disposition:   FU:  see above  Signed, Cecilie Kicks, NP  07/20/2019 4:14 PM    [facility] [place], [address] [place], [place] Phone: [phone]; Fax: [phone]

## 2019-07-21 ENCOUNTER — Other Ambulatory Visit: Payer: Self-pay

## 2019-07-21 DIAGNOSIS — N186 End stage renal disease: Secondary | ICD-10-CM | POA: Diagnosis not present

## 2019-07-21 DIAGNOSIS — Z23 Encounter for immunization: Secondary | ICD-10-CM | POA: Diagnosis not present

## 2019-07-21 NOTE — Patient Outreach (Signed)
[facility]) Care Management  07/21/2019  Monica [name] 11/05/1930 [id number]   Transition of Care Week #2   Outreach attempt #1 to patient. Spoke with patient who voices she is doing well. She denies any chest pain or issues or concerns. She shares that she went to dialysis treatment earlier today and tolerated treatment with no complications. She voices that she is adhering to med regimen. She has already competed cardiology follow up appt on yesterday. Patient reports appt went well and no changes to plan of care made. Patient confirms that family is in the home and available to assist her as needed. She denies any RN CM needs or concerns at this time.     Plan: RN CM discussed with patient next outreach within a week. Patient gave verbal consent and in agreement with RN CM follow up and timeframe. Patient aware that they may contact RN CM sooner for any issues or concerns.  Enzo Montgomery, RN,BSN,CCM [place] Management Telephonic Care Management Coordinator Direct Phone: [phone] Toll Free: [phone] Fax: [phone]

## 2019-07-22 ENCOUNTER — Other Ambulatory Visit: Payer: Self-pay

## 2019-07-22 ENCOUNTER — Ambulatory Visit: Payer: Medicare HMO

## 2019-07-22 NOTE — Patient Outreach (Signed)
[facility]) Care Management  07/22/2019  Monica [name] Jul 17, 1930 [id number]     Multidisciplinary Case Discussion Date of Review:07/21/2019 Reason: 30 day readmission PCP: Dr. Vista Lawman Insurance: [employer] Medicare    Medical Info:86 yr old [place] American female with PMH that includes: DM,GERD,ESRD,CHF(EF 30-35%),HTN, HLD, anemia, bradycardia/sinus node dysfunction CAD s/p CABG(2017) and NSTEMI(July 2020)  Admissions:Patient has had two inpatient admission related to chest pain and MI. Patient presented to the ED on 06/21/2019 for chest pain and found to have had an NSTEMI. She also tested positive for COVID-19 during this time. She was admitted to the hospital. During her hospital stay if was recommended that patient undergo cardiac cath. However, patient adamantly refused. Hospice and palliative care was also recommended which was not well received by patient and family and she  ultimately declined. She was discharged home then presented to the ED again on 07/11/2019 with chest pain and found to have suffered another MI. Urgent cardiac cath was recommended and patient did consent this time. Cath showed high-grade in-stent restenosis within the distal ramus intermedius branch SVG stent. Patient developed chest pain and bradycardia on 07/12/2019 following dialysis treatment and was taken back to the cath lab for further eval with possible residual downstream occlusion from initial PCI on 07/11/2019 noted.   Disposition: Patient's chest pain resolved and she requested to be discharged home. She was discharged on 07/13/2019 with HHPT.  RN CM Follow Up: Patient will continue to be followed for case mgmt services.   Enzo Montgomery, RN,BSN,CCM [place] Management Telephonic Care Management Coordinator Direct Phone: [phone] Toll Free: [phone] Fax: [phone]

## 2019-07-23 DIAGNOSIS — N186 End stage renal disease: Secondary | ICD-10-CM | POA: Diagnosis not present

## 2019-07-23 DIAGNOSIS — Z23 Encounter for immunization: Secondary | ICD-10-CM | POA: Diagnosis not present

## 2019-07-26 DIAGNOSIS — N186 End stage renal disease: Secondary | ICD-10-CM | POA: Diagnosis not present

## 2019-07-26 DIAGNOSIS — Z23 Encounter for immunization: Secondary | ICD-10-CM | POA: Diagnosis not present

## 2019-07-27 DIAGNOSIS — I5042 Chronic combined systolic (congestive) and diastolic (congestive) heart failure: Secondary | ICD-10-CM | POA: Diagnosis not present

## 2019-07-27 DIAGNOSIS — E1122 Type 2 diabetes mellitus with diabetic chronic kidney disease: Secondary | ICD-10-CM | POA: Diagnosis not present

## 2019-07-27 DIAGNOSIS — U071 COVID-19: Secondary | ICD-10-CM | POA: Diagnosis not present

## 2019-07-27 DIAGNOSIS — I252 Old myocardial infarction: Secondary | ICD-10-CM | POA: Diagnosis not present

## 2019-07-27 DIAGNOSIS — E1151 Type 2 diabetes mellitus with diabetic peripheral angiopathy without gangrene: Secondary | ICD-10-CM | POA: Diagnosis not present

## 2019-07-27 DIAGNOSIS — D631 Anemia in chronic kidney disease: Secondary | ICD-10-CM | POA: Diagnosis not present

## 2019-07-27 DIAGNOSIS — N186 End stage renal disease: Secondary | ICD-10-CM | POA: Diagnosis not present

## 2019-07-27 DIAGNOSIS — I132 Hypertensive heart and chronic kidney disease with heart failure and with stage 5 chronic kidney disease, or end stage renal disease: Secondary | ICD-10-CM | POA: Diagnosis not present

## 2019-07-27 DIAGNOSIS — I2510 Atherosclerotic heart disease of native coronary artery without angina pectoris: Secondary | ICD-10-CM | POA: Diagnosis not present

## 2019-07-28 ENCOUNTER — Other Ambulatory Visit: Payer: Self-pay

## 2019-07-28 DIAGNOSIS — N186 End stage renal disease: Secondary | ICD-10-CM | POA: Diagnosis not present

## 2019-07-28 DIAGNOSIS — E8779 Other fluid overload: Secondary | ICD-10-CM | POA: Diagnosis not present

## 2019-07-28 NOTE — Patient Outreach (Signed)
[facility]) Care Management  07/28/2019  Monica [name] 1930-07-17 [id number]   Transition of Care    Outreach attempt to patient. Call went straight to voicemail.    Plan; RN CM will make outreach attempt to patient within 3-4 business days.   Enzo Montgomery, RN,BSN,CCM [place] Management Telephonic Care Management Coordinator Direct Phone: [phone] Toll Free: [phone] Fax: [phone]

## 2019-07-29 DIAGNOSIS — E8779 Other fluid overload: Secondary | ICD-10-CM | POA: Diagnosis not present

## 2019-07-29 DIAGNOSIS — N186 End stage renal disease: Secondary | ICD-10-CM | POA: Diagnosis not present

## 2019-07-30 DIAGNOSIS — E8779 Other fluid overload: Secondary | ICD-10-CM | POA: Diagnosis not present

## 2019-07-30 DIAGNOSIS — N186 End stage renal disease: Secondary | ICD-10-CM | POA: Diagnosis not present

## 2019-08-02 DIAGNOSIS — N186 End stage renal disease: Secondary | ICD-10-CM | POA: Diagnosis not present

## 2019-08-02 DIAGNOSIS — E8779 Other fluid overload: Secondary | ICD-10-CM | POA: Diagnosis not present

## 2019-08-03 ENCOUNTER — Other Ambulatory Visit: Payer: Self-pay

## 2019-08-03 DIAGNOSIS — E1151 Type 2 diabetes mellitus with diabetic peripheral angiopathy without gangrene: Secondary | ICD-10-CM | POA: Diagnosis not present

## 2019-08-03 DIAGNOSIS — U071 COVID-19: Secondary | ICD-10-CM | POA: Diagnosis not present

## 2019-08-03 DIAGNOSIS — N186 End stage renal disease: Secondary | ICD-10-CM | POA: Diagnosis not present

## 2019-08-03 DIAGNOSIS — D631 Anemia in chronic kidney disease: Secondary | ICD-10-CM | POA: Diagnosis not present

## 2019-08-03 DIAGNOSIS — E1122 Type 2 diabetes mellitus with diabetic chronic kidney disease: Secondary | ICD-10-CM | POA: Diagnosis not present

## 2019-08-03 DIAGNOSIS — I252 Old myocardial infarction: Secondary | ICD-10-CM | POA: Diagnosis not present

## 2019-08-03 DIAGNOSIS — I132 Hypertensive heart and chronic kidney disease with heart failure and with stage 5 chronic kidney disease, or end stage renal disease: Secondary | ICD-10-CM | POA: Diagnosis not present

## 2019-08-03 DIAGNOSIS — I2510 Atherosclerotic heart disease of native coronary artery without angina pectoris: Secondary | ICD-10-CM | POA: Diagnosis not present

## 2019-08-03 DIAGNOSIS — I5042 Chronic combined systolic (congestive) and diastolic (congestive) heart failure: Secondary | ICD-10-CM | POA: Diagnosis not present

## 2019-08-03 NOTE — Patient Outreach (Signed)
[facility]) Care Management  08/03/2019  Monica [name] 03/11/1931 [id number]   Transition of Care   Outreach attempt #2 to patient. Spoke with patient who reports she is doing well today. She does share that on yesterday evening after eating dinner she experienced some mild chest pain. She reports that both her son and daughter were in the home with her. She took one Nitro tablet and it relieved the pain. She denies any further episodes. RN CM discussed with patient possibly obtaining life alert device for emergency situations int he event her children are not there with her. She will discuss with her children and look into what's available with her [employer] benefit. She goes to see cardiologist on next week. Patient was scheduled to get COVID-19 vaccine but appt was cancelled due to lack of vaccine availability. She plans to reschedule when she is able to do so. She denies any RN CM needs or concerns at this time. She is aware to call if needs arise.       Plan: RN CM discussed with patient next outreach within a month. Patient gave verbal consent and in agreement with RN CM follow up and timeframe. Patient aware that they may contact RN CM sooner for any issues or concerns.    Enzo Montgomery, RN,BSN,CCM [facility] Telephonic Care Management Coordinator Direct Phone: [phone] Toll Free: [phone] Fax: [phone]

## 2019-08-04 DIAGNOSIS — Z114 Encounter for screening for human immunodeficiency virus [HIV]: Secondary | ICD-10-CM | POA: Diagnosis not present

## 2019-08-04 DIAGNOSIS — E8779 Other fluid overload: Secondary | ICD-10-CM | POA: Diagnosis not present

## 2019-08-04 DIAGNOSIS — Z1159 Encounter for screening for other viral diseases: Secondary | ICD-10-CM | POA: Diagnosis not present

## 2019-08-04 DIAGNOSIS — N186 End stage renal disease: Secondary | ICD-10-CM | POA: Diagnosis not present

## 2019-08-05 ENCOUNTER — Ambulatory Visit: Payer: Medicare HMO | Admitting: Cardiology

## 2019-08-06 DIAGNOSIS — N186 End stage renal disease: Secondary | ICD-10-CM | POA: Diagnosis not present

## 2019-08-06 DIAGNOSIS — E8779 Other fluid overload: Secondary | ICD-10-CM | POA: Diagnosis not present

## 2019-08-07 DIAGNOSIS — N186 End stage renal disease: Secondary | ICD-10-CM | POA: Diagnosis not present

## 2019-08-07 DIAGNOSIS — Z992 Dependence on renal dialysis: Secondary | ICD-10-CM | POA: Diagnosis not present

## 2019-08-08 ENCOUNTER — Encounter: Payer: Self-pay | Admitting: Physician Assistant

## 2019-08-08 NOTE — Progress Notes (Signed)
Cardiology Office Note    Date:  08/10/2019   ID:  Monica [name], DOB 25-Jan-1931, MRN [id number]  PCP:  Benito Mccreedy, MD  Cardiologist:  Ena Dawley, MD  Electrophysiologist:  None   Chief Complaint: f/u CAD, CHF  History of Present Illness:   Monica [name] is a 86 y.o. female with history of CAD (s/p CABG in 2017, NSTEMI 01/2019 s/p PCI, NSTEMI 07/2019 s/p PCI ), chronic combined CHF, moderate mitral regurgitation, anemia, bradycardia/sinus node dysfunction, ESRD on HD (TTS), GERD, HLD and DM who presents for follow-up.  She has had a tumultous course in the last year. She was admitted July 2020 forNSTEMI. Cath 01/07/2019 cath showed occluded/atretic SVG to the distal RCA, severe proximal to stent and in-stent restenosis of SVG to OM with successful PTCA/with overlapping DES stent and successful DES to the ostial proximal SVG to the RI.Echo showed new reduced LVEF 30 to 35% with moderately dilated right and left atrium moderate MR and mild AI. She was placed on ASA, Plavix and metoprolol. She was readmitted in mid-July with fatigue and dizziness with junctional bradycardia HR in the 40s, therefore metoprolol was discontinued. She was then again readmitted 02/10/19 with chest pain and elevated troponin. Nuclear stress test was abnormal, prompting repeat cath with stable disease and no targets for PCI. Medical therapy was recommended. She has been readmitted several times since then for volume overload or chest pain. She tested positive for Covid 05/31/19 as well as on admission 06/21/19. During November 2020 admission, plan was for possible amyloid work-up but both the daughter and the patient were requesting discharge after she was feeling better. During her December 2020 admission with chest pain, per discussion with patient and daughter, medical therapy was continued, reserving cath for recurrent symptoms. There was a period of time she was also reported to be refusing medications and thought to  be incompetent per daughter. Palliative care concept was introduced last year but not well received per notes.   She was readmitted 07/2019 with chest pain waking her out of sleep. Initial electrocardiogram showed marked ST depression in the inferolateral leads. F/u ECGshowed junctional rhythm, left axis deviation, improvement in ST depression. Her carvedilol was held due to junctional rhythm. hsTroponin peaked at 3107. She underwent cath with PCI as below with successful restenting of the SVG-ramus branch in-stent restenosis with DES. Her LVEDP was elevated. The day after cath she developed recurrent chest pain, SOB, and onset of bradycardia in the low-mid 40s. She received SL NTG with only transient relief. She was taken back to the cath lab as above without culprit lesion for recurrence of anginal pain, suspect possible residual downstream occlusion from initial PCI on 07/11/2019. She had normal LVEDP. Her carvedilol was definitively discontinued. Last labs otherwise personally reviewed from 07/2019 include LDL of 66, K 4.7, Cr 4.88, albumin 3.2, Hgb 13.3, normal AST/ALT.  She returns for follow-up today with daughter Monica [name]. They both feel she's doing quite well. She's been overall feeling good. She did have 3 episodes since last visit of chest discomfort that felt like indigestion, provoked solely by eating. This was not like prior angina, reminded her of reflux. She took a SL NTG each time and it resolved after 15-20 minutes. She has been increasing her activity a little bit around the house and does not have any exertional angina. Her breathing has been stable. No edema, orthopnea, palpitations, bleeding. Initial BP slightly elevated at 152/78, recheck by me was 127/62. She does report her BP  occasionally drops at HD but is not sure of the values.   Past Medical History:  Diagnosis Date  . Anemia   . Asthma   . Chronic combined systolic and diastolic CHF (congestive heart failure) ([place])    a. Echo  11/19 [facility]):  EF 55-65, normal wall motion, normal diastolic function. b. Echo 01/2019 EF 30-35%, moderate MR, mild AI.  Marland Kitchen Coronary artery disease    a. s/p CABG 2017. b.s/p NSTEMI 11/19 [facility]) >> S-PDA 100 >>PCI: DES to S-OM. c. NSTEMI 01/2019 - occluded SVG-distal RCA, severe proximal to stent/ISR of SVG-OM s/p overlapping DES, DES to ostial prox SVG to the RI. d. NSTEMI 07/2019 s/p DES to ramus ISR.  Marland Kitchen COVID-19 virus infection 05/2019  . ESRD (end stage renal disease)    Dialysis Tu, Th, Sa  . Essential hypertension   . Gastroesophageal reflux disease   . History of blood transfusion   . Hyperlipidemia   . Junctional bradycardia   . Moderate mitral regurgitation   . Peptic ulcer   . Protein calorie malnutrition ([place])   . Sinus node dysfunction ([facility])   . Type 2 diabetes mellitus ([place])     Past Surgical History:  Procedure Laterality Date  . ABDOMINAL HYSTERECTOMY    . CARDIAC CATHETERIZATION N/A 09/17/2015   Procedure: Left Heart Cath and Coronary Angiography;  Surgeon: Jettie Booze, MD;  Location: [place] CV LAB;  Service: Cardiovascular;  Laterality: N/A;  . COLON SURGERY    . CORONARY ARTERY BYPASS GRAFT N/A 09/19/2015   Procedure: CORONARY ARTERY BYPASS GRAFTING (CABG) TIMES FOUR USING BILATARAL SAPHENOUS VEIN GRAFTS AND LEFT INTERNAL MAMMARY ARTERY;  Surgeon: [name], MD;  Location: [place];  Service: Open Heart Surgery;  Laterality: N/A;  . CORONARY STENT INTERVENTION N/A 01/10/2019   Procedure: CORONARY STENT INTERVENTION;  Surgeon: Leonie Man, MD;  Location: [place] CV LAB;  Service: Cardiovascular;  Laterality: N/A;  . CORONARY STENT INTERVENTION N/A 07/11/2019   Procedure: CORONARY STENT INTERVENTION;  Surgeon: Lorretta Harp, MD;  Location: [place] CV LAB;  Service: Cardiovascular;  Laterality: N/A;  . LEFT HEART CATH AND CORONARY ANGIOGRAPHY N/A 07/12/2019   Procedure: LEFT HEART CATH AND CORONARY ANGIOGRAPHY;  Surgeon:  Leonie Man, MD;  Location: [place] CV LAB;  Service: Cardiovascular;  Laterality: N/A;  . LEFT HEART CATH AND CORS/GRAFTS ANGIOGRAPHY N/A 01/10/2019   Procedure: LEFT HEART CATH AND CORS/GRAFTS ANGIOGRAPHY;  Surgeon: Leonie Man, MD;  Location: [place] CV LAB;  Service: Cardiovascular;  Laterality: N/A;  . LEFT HEART CATH AND CORS/GRAFTS ANGIOGRAPHY N/A 02/11/2019   Procedure: LEFT HEART CATH AND CORS/GRAFTS ANGIOGRAPHY;  Surgeon: Burnell Blanks, MD;  Location: [place] [facility];  Service: Cardiovascular;  Laterality: N/A;  . LEFT HEART CATH AND CORS/GRAFTS ANGIOGRAPHY N/A 07/11/2019   Procedure: LEFT HEART CATH AND CORS/GRAFTS ANGIOGRAPHY;  Surgeon: Lorretta Harp, MD;  Location: [place] CV LAB;  Service: Cardiovascular;  Laterality: N/A;  . TEE WITHOUT CARDIOVERSION N/A 09/19/2015   Procedure: TRANSESOPHAGEAL ECHOCARDIOGRAM (TEE);  Surgeon: [name], MD;  Location: [place];  Service: Open Heart Surgery;  Laterality: N/A;    Current Medications: Current Meds  Medication Sig  . acetaminophen (TYLENOL) 500 MG tablet Take 1 tablet (500 mg total) by mouth every 6 (six) hours as needed for mild pain or headache.  . albuterol (PROVENTIL HFA;VENTOLIN HFA) 108 (90 Base) MCG/ACT inhaler Inhale 2 puffs into the lungs every  6 (six) hours as needed for wheezing or shortness of breath.  . allopurinol (ZYLOPRIM) 100 MG tablet Take 100 mg by mouth 2 (two) times daily.   Marland Kitchen aspirin EC 81 MG EC tablet Take 1 tablet (81 mg total) by mouth daily.  Marland Kitchen atorvastatin (LIPITOR) 80 MG tablet Take 80 mg by mouth daily at 6 PM.   . brimonidine (ALPHAGAN P) 0.1 % SOLN Place 1 drop into both eyes 2 (two) times daily.  . Cholecalciferol (VITAMIN D-3) 1000 units CAPS Take 1,000 Units by mouth daily.   . clopidogrel (PLAVIX) 75 MG tablet Take 1 tablet (75 mg total) by mouth daily.  . Cranberry 500 MG CAPS Take 1 capsule by mouth daily.  . diphenhydrAMINE (BENADRYL) 25 mg capsule Take 25 mg  by mouth as needed.  . ferric citrate (AURYXIA) 1 GM 210 MG(Fe) tablet Take 210 mg by mouth 3 (three) times daily with meals.   . Insulin Degludec (TRESIBA FLEXTOUCH [place]) Inject 20 Units into the skin daily.  . isosorbide mononitrate (IMDUR) 60 MG 24 hr tablet Take 1 tablet (60 mg total) by mouth 2 (two) times daily.  Marland Kitchen latanoprost (XALATAN) 0.005 % ophthalmic solution Place 1 drop into both eyes at bedtime.  Marland Kitchen loratadine (CLARITIN) 10 MG tablet Take 10 mg by mouth as needed for allergies.   . Melatonin 5 MG CAPS Take 5 mg by mouth at bedtime.   . multivitamin (RENA-VIT) TABS tablet Take 1 tablet by mouth daily.  . nitroGLYCERIN (NITROSTAT) 0.4 MG SL tablet Place 1 tablet (0.4 mg total) under the tongue every 5 (five) minutes as needed for chest pain.  . pantoprazole (PROTONIX) 20 MG tablet Take 20 mg by mouth daily.  . Probiotic Product (PROBIOTIC PO) Take 1 tablet by mouth daily. 50 billion cfus      Allergies:   Beta adrenergic blockers   Social History   Socioeconomic History  . Marital status: Widowed    Spouse name: Not on file  . Number of children: Not on file  . Years of education: Not on file  . Highest education level: Not on file  Occupational History  . Occupation: Retired  Tobacco Use  . Smoking status: Never Smoker  . Smokeless tobacco: Former Systems developer    Types: Snuff  Substance and Sexual Activity  . Alcohol use: No    Alcohol/week: 0.0 standard drinks  . Drug use: No  . Sexual activity: Not on file  Other Topics Concern  . Not on file  Social History Narrative  . Not on file   Social Determinants of Health   Financial Resource Strain: Low Risk   . Difficulty of Paying Living Expenses: Not hard at all  Food Insecurity: No Food Insecurity  . Worried About Charity fundraiser in the Last Year: Never true  . Ran Out of Food in the Last Year: Never true  Transportation Needs: No Transportation Needs  . Lack of Transportation (Medical): No  . Lack of  Transportation (Non-Medical): No  Physical Activity:   . Days of Exercise per Week: Not on file  . Minutes of Exercise per Session: Not on file  Stress:   . Feeling of Stress : Not on file  Social Connections: Somewhat Isolated  . Frequency of Communication with Friends and Family: More than three times a week  . Frequency of Social Gatherings with Friends and Family: More than three times a week  . Attends Religious Services: More than 4 times per year  .  Active Member of Clubs or Organizations: No  . Attends Archivist Meetings: Never  . Marital Status: Widowed     Family History:  The patient's family history includes Heart attack in her father.  ROS:   Please see the history of present illness.  All other systems are reviewed and otherwise negative.    EKGs/Labs/Other Studies Reviewed:    Studies reviewed are outlined and summarized above. Reports included below if pertinent.  Cardiac Cath 07/11/19  PROCEDURE DESCRIPTION:  The patient was brought to the second floor [facility]iac cath lab in the postabsorptive state. She was not premedicated . Her right groin was prepped and shaved in usual sterile fashion. Xylocaine 1% was used for local anesthesia. A 5 French sheath was inserted into the right common femoral artery using standard Seldinger technique. 5 French right left Judkins diagnostic catheters (JL 5) and 5 French pigtail catheter used for selective coronary angiography, selective vein graft and IMA graft angiography and obtain left heart pressures. Omnipaque dye was used for the entirety of the case. Retrograde aorta, left ventricular and pullback pressures were recorded. (LVEDP was 30).  The patient received Angiomax bolus followed by infusion with a therapeutic ACT.She received an additional 600 mg of p.o. Plavix since she had not taken her Plavix today. Isovue dye was used for the entirety of the intervention. Retroaortic pressures  monitored in the case. Using a 6 [place] AL-1 guide catheter along with a 0.14 [place] guidewire the area of "in-stent restenosis" within the distal ramus intermedius branch SV G stented segment was crossed with little difficulty. The entirety of that stent was predilated with a 2 mm x 12 mm balloon. Following this a 2.5 mm x 28 mm long Synergy drug-eluting stent was then carefully positioned across the previously stented segment and deployed at 16 atm (2.7 mm). The wire was then withdrawn out of the native vessel and 200 mcg of intragraft nitroglycerin was administered revealing a widely patent distal vessel and patent stented segment.   Mid RCA lesion is 100% stenosed.  Origin to Prox Graft lesion is 100% stenosed.  Ost LM to Mid LM lesion is 60% stenosed.  Ost Cx to Prox Cx lesion is 100% stenosed.  Ost LAD to Prox LAD lesion is 99% stenosed.  Prox LAD to Mid LAD lesion is 100% stenosed.  Dist Graft to Insertion lesion is 99% stenosed.  Previously placed Origin to Prox Graft stent (unknown type) is widely patent.  A drug-eluting stent was successfully placed.  Post intervention, there is a 0% residual stenosis.  IMPRESSION:Ms. Vanwyk has high-grade in-stent restenosis within the distal ramus intermedius branch SVG stent. The LIMA to the LAD was patent. The vein to the obtuse marginal branch was patent as well (proximal stented segment widely patent). The native right and RCA SVG were known to be occluded. Her LVEDP was significantly elevated at 30. She was dialyzed yesterday. Her serum potassium was 6.2 by i-STAT. She had successful restenting of the ramus branch in-stent restenosis with a synergy drug-eluting stent with excellent result. Angiomax was discontinued at the end of the case. She will need hemodialysis early evening because of her elevated LVEDP and her hyperkalemia. Both sheaths were sewn securely in place. The patient left lab in stable condition. Dr.  Stanford Breed was notified of these results.  Quay Burow. MD, [facility] 07/11/2019 5:03 PM  Cardiac Cath 07/12/19 Conclusion    ------ NATIVE CORONARIES -------  Dist LM to Prox LAD lesion is 30% stenosed.  Mid LAD-1 lesion is 95% stenosed. Mid LAD-2 lesion is 100% stenosed.  Ost Cx to Mid Cx lesion is 100% stenosed. Ramus lesion is 99% stenosed.  Prox RCA lesion is 70% stenosed. Prox RCA to Mid RCA lesion is 90% stenosed. Mid RCA lesion is 95% stenosed. Dist RCA lesion is 100% stenosed with 100% stenosed side branch in RPAV.  Some of the distal RCA branches are filled via left to right collaterals  ------ GRAFTS ------  LIMA graft was not injected as it was widely patent on January 4  SVG-dRCA graft was not visualized -- known occlusion.  SVG-OM1 is large. Previously placed Mid Graft to Insertion stent (2 previous overlapping drug-eluting stents covered with a third drug-eluting stent) is widely patent.  SVG-RI is large. Previously placed Origin to Prox Graft stent (drug-eluting stent) is widely patent. (There is mild competitive flow)  ------------  LV end diastolic pressure is normal.  Mid Graft to Dist Graft lesion is 100% stenosed.  SUMMARY  Widely patent SVG-OM stent placed on 07/11/2019 with no residual stenosis. Also widely patent SVG-RI with previous stent placed in July 2020  Stable occlusive native CAD with known occlusion of native RCA, LAD and circumflex. Severe disease of the proximal RI.  Known patent LIMA-LAD and occluded SVG-RCA.  Normal LVEDP  No culprit lesion for recurrence of anginal pain. Suspect possible residual downstream occlusion from initial PCI on 07/11/2019   RECOMMENDATIONS  Continue current medications, anticipate possible discharge tomorrow if stable.  Continue medical management of existing CAD.  [name], MD   2D echo 01/2019 IMPRESSIONS  1. The left ventricle has moderate-severely reduced systolic function,  with  an ejection fraction of 30-35%. The cavity size was normal. There is  moderate concentric left ventricular hypertrophy. Left ventricular  diastolic Doppler parameters are  consistent with pseudonormalization. Elevated left atrial and left  ventricular end-diastolic pressures.  2. The right ventricle has moderately reduced systolic function. The  cavity was normal. There is no increase in right ventricular wall  thickness. Right ventricular systolic pressure is normal.  3. Left atrial size was moderately dilated.  4. Right atrial size was mildly dilated.  5. The mitral valve is degenerative. Moderate thickening of the mitral  valve leaflet. There is mild mitral annular calcification present. Mitral  valve regurgitation is moderate by color flow Doppler.  6. The aortic valve is tricuspid. Mild thickening of the aortic valve.  Aortic valve regurgitation is mild by color flow Doppler.     EKG:  EKG is not ordered today  Recent Labs: 05/31/2019: B Natriuretic Peptide 3,301.8 06/21/2019: TSH 4.803 07/11/2019: ALT 27 07/12/2019: BUN 20; Creatinine, Ser 4.88; Potassium 4.7; Sodium 135 07/13/2019: Hemoglobin 13.3; Platelets 166  Recent Lipid Panel    Component Value Date/Time   CHOL 134 07/12/2019 0845   CHOL 132 04/29/2019 0940   TRIG 78 07/12/2019 0845   HDL 52 07/12/2019 0845   HDL 48 04/29/2019 0940   CHOLHDL 2.6 07/12/2019 0845   VLDL 16 07/12/2019 0845   LDLCALC 66 07/12/2019 0845   LDLCALC 68 04/29/2019 0940    PHYSICAL EXAM:    VS:  BP 127/62   Pulse 88   Ht '5\' 2"'$  (1.575 m)   Wt 158 lb 12 oz (72 kg)   SpO2 99%   BMI 29.04 kg/m   BMI: Body mass index is 29.04 kg/m.  GEN: Well nourished, well developed AAF in no acute distress - Appears younger than stated age 86: normocephalic, atraumatic Neck:  no JVD, carotid bruits, or masses Cardiac: RRR; no murmurs, rubs, or gallops, no edema  Respiratory:  clear to auscultation bilaterally, normal work of breathing GI:  soft, nontender, nondistended, + BS MS: no deformity or atrophy Skin: warm and dry, no rash Neuro:  Alert and Oriented x 3, Strength and sensation are intact, follows commands Psych: euthymic mood, full affect  Wt Readings from Last 3 Encounters:  08/10/19 158 lb 12 oz (72 kg)  07/20/19 157 lb (71.2 kg)  07/12/19 165 lb 12.6 oz (75.2 kg)     ASSESSMENT & PLAN:   1. CAD - continue ASA/Plavix and statin. She has had a few episodes of chest discomfort only after eating, but no exertional angina or symptoms reminiscent of recent MI. It was resolved after 15-20 minutes of taking NTG. Question GERD vs esophageal spasm. Will increase Protonix to 79m daily and follow. I was impressed at the clip/speed at which she was able to ambulate today in clinic without any angina or dyspnea whatsoever. Her daughter will call uKoreaif she has any further episodes on the higher dose of Protonix. Continue Imdur for now.  2. Chronic combined CHF - appears euvolemic. Volume managed by HD. Not on BB due to bradycardia. Not on ACEI/ARB/ARNI/spiro due to CKD. She was previously on hydralazine but this was stopped due to blood pressure lability. Her initial BP was slightly elevated on arrival but recheck by me was normal. I asked her daughter to follow this at home for a few days to collect some readings, and she will also find out from the dialysis center tomorrow what the lowest blood pressure readings they have seen are. 3. Mitral regurgitation, moderate - continue to follow clinically for now. No significant murmur on exam. 4. Bradycardia - resolved off beta blocker. Added BB to her intolerances so future providers are aware if this is ever re-ordered going forward. 5. Hyperlipidemia - controlled by last value, LDL 66. Continue statin.  Disposition: F/u with Dr. NMeda Coffeein 4 months. Sooner if CP recurs.  Medication Adjustments/Labs and Tests Ordered: Current medicines are reviewed at length with the patient today.   Concerns regarding medicines are outlined above. Medication changes, Labs and Tests ordered today are summarized above and listed in the Patient Instructions accessible in Encounters.   Signed, DCharlie Pitter PA-C  08/10/2019 3:43 PM    [facility] [address]  [id number]: [phone] Fax: [phone]

## 2019-08-09 DIAGNOSIS — N186 End stage renal disease: Secondary | ICD-10-CM | POA: Diagnosis not present

## 2019-08-09 DIAGNOSIS — D631 Anemia in chronic kidney disease: Secondary | ICD-10-CM | POA: Diagnosis not present

## 2019-08-09 DIAGNOSIS — N2581 Secondary hyperparathyroidism of renal origin: Secondary | ICD-10-CM | POA: Diagnosis not present

## 2019-08-09 DIAGNOSIS — E8779 Other fluid overload: Secondary | ICD-10-CM | POA: Diagnosis not present

## 2019-08-10 ENCOUNTER — Ambulatory Visit: Payer: Medicare HMO | Admitting: Physician Assistant

## 2019-08-10 ENCOUNTER — Encounter: Payer: Self-pay | Admitting: Physician Assistant

## 2019-08-10 ENCOUNTER — Other Ambulatory Visit: Payer: Self-pay

## 2019-08-10 VITALS — BP 127/62 | HR 88 | Ht 62.0 in | Wt 158.8 lb

## 2019-08-10 DIAGNOSIS — R001 Bradycardia, unspecified: Secondary | ICD-10-CM | POA: Diagnosis not present

## 2019-08-10 DIAGNOSIS — N186 End stage renal disease: Secondary | ICD-10-CM | POA: Diagnosis not present

## 2019-08-10 DIAGNOSIS — I2510 Atherosclerotic heart disease of native coronary artery without angina pectoris: Secondary | ICD-10-CM

## 2019-08-10 DIAGNOSIS — E785 Hyperlipidemia, unspecified: Secondary | ICD-10-CM | POA: Diagnosis not present

## 2019-08-10 DIAGNOSIS — E8779 Other fluid overload: Secondary | ICD-10-CM | POA: Diagnosis not present

## 2019-08-10 DIAGNOSIS — I5042 Chronic combined systolic (congestive) and diastolic (congestive) heart failure: Secondary | ICD-10-CM

## 2019-08-10 DIAGNOSIS — N2581 Secondary hyperparathyroidism of renal origin: Secondary | ICD-10-CM | POA: Diagnosis not present

## 2019-08-10 DIAGNOSIS — D631 Anemia in chronic kidney disease: Secondary | ICD-10-CM | POA: Diagnosis not present

## 2019-08-10 DIAGNOSIS — I340 Nonrheumatic mitral (valve) insufficiency: Secondary | ICD-10-CM

## 2019-08-10 MED ORDER — PANTOPRAZOLE SODIUM 40 MG PO TBEC: 40.0000 mg | DELAYED_RELEASE_TABLET | Freq: Every day | ORAL | 3 refills | Status: DC

## 2019-08-10 NOTE — Patient Instructions (Addendum)
Medication Instructions:  Your physician has recommended you make the following change in your medication:  1.  INCREASE the Protonix to 40 mg taking 1 tablet daily  *If you need a refill on your cardiac medications before your next appointment, please call your pharmacy*  Lab Work: None ordered  If you have labs (blood work) drawn today and your tests are completely normal, you will receive your results only by: Marland Kitchen MyChart Message (if you have MyChart) OR . A paper copy in the mail If you have any lab test that is abnormal or we need to change your treatment, we will call you to review the results.  Testing/Procedures: None ordered   Follow-Up: At [facility], you and your health needs are our priority.  As part of our continuing mission to provide you with exceptional heart care, we have created designated Provider Care Teams.  These Care Teams include your primary Cardiologist (physician) and Advanced Practice Providers (APPs -  Physician Assistants and Nurse Practitioners) who all work together to provide you with the care you need, when you need it.  Your next appointmen t:   4  month(s)   12/14/2019 ARRIVE AT 2:45 FOR REGISTRATION  The format for your next appointment:   In Person  Provider:   You may see Ena Dawley, MD or one of the following Advanced Practice Providers on your designated Care Team:    Melina Copa, PA-C  Ermalinda Barrios, PA-C   Other Instructions

## 2019-08-11 DIAGNOSIS — E8779 Other fluid overload: Secondary | ICD-10-CM | POA: Diagnosis not present

## 2019-08-11 DIAGNOSIS — N186 End stage renal disease: Secondary | ICD-10-CM | POA: Diagnosis not present

## 2019-08-11 DIAGNOSIS — D631 Anemia in chronic kidney disease: Secondary | ICD-10-CM | POA: Diagnosis not present

## 2019-08-11 DIAGNOSIS — N2581 Secondary hyperparathyroidism of renal origin: Secondary | ICD-10-CM | POA: Diagnosis not present

## 2019-08-11 DIAGNOSIS — E119 Type 2 diabetes mellitus without complications: Secondary | ICD-10-CM | POA: Diagnosis not present

## 2019-08-11 DIAGNOSIS — M109 Gout, unspecified: Secondary | ICD-10-CM | POA: Diagnosis not present

## 2019-08-12 DIAGNOSIS — I252 Old myocardial infarction: Secondary | ICD-10-CM | POA: Diagnosis not present

## 2019-08-12 DIAGNOSIS — N186 End stage renal disease: Secondary | ICD-10-CM | POA: Diagnosis not present

## 2019-08-12 DIAGNOSIS — D631 Anemia in chronic kidney disease: Secondary | ICD-10-CM | POA: Diagnosis not present

## 2019-08-12 DIAGNOSIS — E1151 Type 2 diabetes mellitus with diabetic peripheral angiopathy without gangrene: Secondary | ICD-10-CM | POA: Diagnosis not present

## 2019-08-12 DIAGNOSIS — I132 Hypertensive heart and chronic kidney disease with heart failure and with stage 5 chronic kidney disease, or end stage renal disease: Secondary | ICD-10-CM | POA: Diagnosis not present

## 2019-08-12 DIAGNOSIS — I2510 Atherosclerotic heart disease of native coronary artery without angina pectoris: Secondary | ICD-10-CM | POA: Diagnosis not present

## 2019-08-12 DIAGNOSIS — E1122 Type 2 diabetes mellitus with diabetic chronic kidney disease: Secondary | ICD-10-CM | POA: Diagnosis not present

## 2019-08-12 DIAGNOSIS — U071 COVID-19: Secondary | ICD-10-CM | POA: Diagnosis not present

## 2019-08-12 DIAGNOSIS — I5042 Chronic combined systolic (congestive) and diastolic (congestive) heart failure: Secondary | ICD-10-CM | POA: Diagnosis not present

## 2019-08-13 DIAGNOSIS — D631 Anemia in chronic kidney disease: Secondary | ICD-10-CM | POA: Diagnosis not present

## 2019-08-13 DIAGNOSIS — N2581 Secondary hyperparathyroidism of renal origin: Secondary | ICD-10-CM | POA: Diagnosis not present

## 2019-08-13 DIAGNOSIS — N186 End stage renal disease: Secondary | ICD-10-CM | POA: Diagnosis not present

## 2019-08-13 DIAGNOSIS — E8779 Other fluid overload: Secondary | ICD-10-CM | POA: Diagnosis not present

## 2019-08-14 ENCOUNTER — Ambulatory Visit: Payer: Medicare HMO

## 2019-08-14 DIAGNOSIS — I2510 Atherosclerotic heart disease of native coronary artery without angina pectoris: Secondary | ICD-10-CM | POA: Diagnosis not present

## 2019-08-14 DIAGNOSIS — I132 Hypertensive heart and chronic kidney disease with heart failure and with stage 5 chronic kidney disease, or end stage renal disease: Secondary | ICD-10-CM | POA: Diagnosis not present

## 2019-08-14 DIAGNOSIS — N186 End stage renal disease: Secondary | ICD-10-CM | POA: Diagnosis not present

## 2019-08-14 DIAGNOSIS — I5042 Chronic combined systolic (congestive) and diastolic (congestive) heart failure: Secondary | ICD-10-CM | POA: Diagnosis not present

## 2019-08-14 DIAGNOSIS — D631 Anemia in chronic kidney disease: Secondary | ICD-10-CM | POA: Diagnosis not present

## 2019-08-14 DIAGNOSIS — E1122 Type 2 diabetes mellitus with diabetic chronic kidney disease: Secondary | ICD-10-CM | POA: Diagnosis not present

## 2019-08-14 DIAGNOSIS — U071 COVID-19: Secondary | ICD-10-CM | POA: Diagnosis not present

## 2019-08-14 DIAGNOSIS — E1151 Type 2 diabetes mellitus with diabetic peripheral angiopathy without gangrene: Secondary | ICD-10-CM | POA: Diagnosis not present

## 2019-08-14 DIAGNOSIS — I252 Old myocardial infarction: Secondary | ICD-10-CM | POA: Diagnosis not present

## 2019-08-15 MED ORDER — CLOPIDOGREL BISULFATE 75 MG PO TABS: 75.0000 mg | ORAL_TABLET | Freq: Every day | ORAL | 3 refills | Status: AC

## 2019-08-16 DIAGNOSIS — E8779 Other fluid overload: Secondary | ICD-10-CM | POA: Diagnosis not present

## 2019-08-16 DIAGNOSIS — N2581 Secondary hyperparathyroidism of renal origin: Secondary | ICD-10-CM | POA: Diagnosis not present

## 2019-08-16 DIAGNOSIS — N186 End stage renal disease: Secondary | ICD-10-CM | POA: Diagnosis not present

## 2019-08-16 DIAGNOSIS — D631 Anemia in chronic kidney disease: Secondary | ICD-10-CM | POA: Diagnosis not present

## 2019-08-17 DIAGNOSIS — I252 Old myocardial infarction: Secondary | ICD-10-CM | POA: Diagnosis not present

## 2019-08-17 DIAGNOSIS — D631 Anemia in chronic kidney disease: Secondary | ICD-10-CM | POA: Diagnosis not present

## 2019-08-17 DIAGNOSIS — E1122 Type 2 diabetes mellitus with diabetic chronic kidney disease: Secondary | ICD-10-CM | POA: Diagnosis not present

## 2019-08-17 DIAGNOSIS — I132 Hypertensive heart and chronic kidney disease with heart failure and with stage 5 chronic kidney disease, or end stage renal disease: Secondary | ICD-10-CM | POA: Diagnosis not present

## 2019-08-17 DIAGNOSIS — N186 End stage renal disease: Secondary | ICD-10-CM | POA: Diagnosis not present

## 2019-08-17 DIAGNOSIS — E1151 Type 2 diabetes mellitus with diabetic peripheral angiopathy without gangrene: Secondary | ICD-10-CM | POA: Diagnosis not present

## 2019-08-17 DIAGNOSIS — I2510 Atherosclerotic heart disease of native coronary artery without angina pectoris: Secondary | ICD-10-CM | POA: Diagnosis not present

## 2019-08-17 DIAGNOSIS — U071 COVID-19: Secondary | ICD-10-CM | POA: Diagnosis not present

## 2019-08-17 DIAGNOSIS — I5042 Chronic combined systolic (congestive) and diastolic (congestive) heart failure: Secondary | ICD-10-CM | POA: Diagnosis not present

## 2019-08-18 DIAGNOSIS — N186 End stage renal disease: Secondary | ICD-10-CM | POA: Diagnosis not present

## 2019-08-18 DIAGNOSIS — E8779 Other fluid overload: Secondary | ICD-10-CM | POA: Diagnosis not present

## 2019-08-18 DIAGNOSIS — D631 Anemia in chronic kidney disease: Secondary | ICD-10-CM | POA: Diagnosis not present

## 2019-08-18 DIAGNOSIS — D509 Iron deficiency anemia, unspecified: Secondary | ICD-10-CM | POA: Diagnosis not present

## 2019-08-19 DIAGNOSIS — J45909 Unspecified asthma, uncomplicated: Secondary | ICD-10-CM | POA: Diagnosis not present

## 2019-08-19 DIAGNOSIS — I5043 Acute on chronic combined systolic (congestive) and diastolic (congestive) heart failure: Secondary | ICD-10-CM | POA: Diagnosis not present

## 2019-08-20 DIAGNOSIS — E8779 Other fluid overload: Secondary | ICD-10-CM | POA: Diagnosis not present

## 2019-08-20 DIAGNOSIS — D631 Anemia in chronic kidney disease: Secondary | ICD-10-CM | POA: Diagnosis not present

## 2019-08-20 DIAGNOSIS — D509 Iron deficiency anemia, unspecified: Secondary | ICD-10-CM | POA: Diagnosis not present

## 2019-08-20 DIAGNOSIS — N186 End stage renal disease: Secondary | ICD-10-CM | POA: Diagnosis not present

## 2019-08-22 DIAGNOSIS — D509 Iron deficiency anemia, unspecified: Secondary | ICD-10-CM | POA: Diagnosis not present

## 2019-08-22 DIAGNOSIS — D631 Anemia in chronic kidney disease: Secondary | ICD-10-CM | POA: Diagnosis not present

## 2019-08-22 DIAGNOSIS — E8779 Other fluid overload: Secondary | ICD-10-CM | POA: Diagnosis not present

## 2019-08-22 DIAGNOSIS — N186 End stage renal disease: Secondary | ICD-10-CM | POA: Diagnosis not present

## 2019-08-23 DIAGNOSIS — D509 Iron deficiency anemia, unspecified: Secondary | ICD-10-CM | POA: Diagnosis not present

## 2019-08-23 DIAGNOSIS — E8779 Other fluid overload: Secondary | ICD-10-CM | POA: Diagnosis not present

## 2019-08-23 DIAGNOSIS — D631 Anemia in chronic kidney disease: Secondary | ICD-10-CM | POA: Diagnosis not present

## 2019-08-23 DIAGNOSIS — N186 End stage renal disease: Secondary | ICD-10-CM | POA: Diagnosis not present

## 2019-08-24 DIAGNOSIS — I5042 Chronic combined systolic (congestive) and diastolic (congestive) heart failure: Secondary | ICD-10-CM | POA: Diagnosis not present

## 2019-08-24 DIAGNOSIS — N186 End stage renal disease: Secondary | ICD-10-CM | POA: Diagnosis not present

## 2019-08-24 DIAGNOSIS — I2510 Atherosclerotic heart disease of native coronary artery without angina pectoris: Secondary | ICD-10-CM | POA: Diagnosis not present

## 2019-08-24 DIAGNOSIS — I132 Hypertensive heart and chronic kidney disease with heart failure and with stage 5 chronic kidney disease, or end stage renal disease: Secondary | ICD-10-CM | POA: Diagnosis not present

## 2019-08-24 DIAGNOSIS — E1151 Type 2 diabetes mellitus with diabetic peripheral angiopathy without gangrene: Secondary | ICD-10-CM | POA: Diagnosis not present

## 2019-08-24 DIAGNOSIS — D631 Anemia in chronic kidney disease: Secondary | ICD-10-CM | POA: Diagnosis not present

## 2019-08-24 DIAGNOSIS — E1122 Type 2 diabetes mellitus with diabetic chronic kidney disease: Secondary | ICD-10-CM | POA: Diagnosis not present

## 2019-08-24 DIAGNOSIS — I252 Old myocardial infarction: Secondary | ICD-10-CM | POA: Diagnosis not present

## 2019-08-24 DIAGNOSIS — U071 COVID-19: Secondary | ICD-10-CM | POA: Diagnosis not present

## 2019-08-25 ENCOUNTER — Other Ambulatory Visit: Payer: Self-pay

## 2019-08-25 DIAGNOSIS — D631 Anemia in chronic kidney disease: Secondary | ICD-10-CM | POA: Diagnosis not present

## 2019-08-25 DIAGNOSIS — E8779 Other fluid overload: Secondary | ICD-10-CM | POA: Diagnosis not present

## 2019-08-25 DIAGNOSIS — D509 Iron deficiency anemia, unspecified: Secondary | ICD-10-CM | POA: Diagnosis not present

## 2019-08-25 DIAGNOSIS — N186 End stage renal disease: Secondary | ICD-10-CM | POA: Diagnosis not present
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
## 2019-08-26 ENCOUNTER — Ambulatory Visit: Payer: Self-pay

## 2019-08-27 DIAGNOSIS — D631 Anemia in chronic kidney disease: Secondary | ICD-10-CM | POA: Diagnosis not present

## 2019-08-27 DIAGNOSIS — D509 Iron deficiency anemia, unspecified: Secondary | ICD-10-CM | POA: Diagnosis not present

## 2019-08-27 DIAGNOSIS — E8779 Other fluid overload: Secondary | ICD-10-CM | POA: Diagnosis not present

## 2019-08-27 DIAGNOSIS — N186 End stage renal disease: Secondary | ICD-10-CM | POA: Diagnosis not present

## 2019-08-29 ENCOUNTER — Ambulatory Visit: Payer: Medicare HMO

## 2019-08-30 DIAGNOSIS — E8779 Other fluid overload: Secondary | ICD-10-CM | POA: Diagnosis not present

## 2019-08-30 DIAGNOSIS — D631 Anemia in chronic kidney disease: Secondary | ICD-10-CM | POA: Diagnosis not present

## 2019-08-30 DIAGNOSIS — N186 End stage renal disease: Secondary | ICD-10-CM | POA: Diagnosis not present

## 2019-08-31 DIAGNOSIS — D631 Anemia in chronic kidney disease: Secondary | ICD-10-CM | POA: Diagnosis not present

## 2019-08-31 DIAGNOSIS — E1122 Type 2 diabetes mellitus with diabetic chronic kidney disease: Secondary | ICD-10-CM | POA: Diagnosis not present

## 2019-08-31 DIAGNOSIS — I132 Hypertensive heart and chronic kidney disease with heart failure and with stage 5 chronic kidney disease, or end stage renal disease: Secondary | ICD-10-CM | POA: Diagnosis not present

## 2019-08-31 DIAGNOSIS — E1151 Type 2 diabetes mellitus with diabetic peripheral angiopathy without gangrene: Secondary | ICD-10-CM | POA: Diagnosis not present

## 2019-08-31 DIAGNOSIS — U071 COVID-19: Secondary | ICD-10-CM | POA: Diagnosis not present

## 2019-08-31 DIAGNOSIS — I252 Old myocardial infarction: Secondary | ICD-10-CM | POA: Diagnosis not present

## 2019-08-31 DIAGNOSIS — N186 End stage renal disease: Secondary | ICD-10-CM | POA: Diagnosis not present

## 2019-08-31 DIAGNOSIS — I2510 Atherosclerotic heart disease of native coronary artery without angina pectoris: Secondary | ICD-10-CM | POA: Diagnosis not present

## 2019-08-31 DIAGNOSIS — I5042 Chronic combined systolic (congestive) and diastolic (congestive) heart failure: Secondary | ICD-10-CM | POA: Diagnosis not present

## 2019-08-31 DIAGNOSIS — E8779 Other fluid overload: Secondary | ICD-10-CM | POA: Diagnosis not present

## 2019-09-01 DIAGNOSIS — E8779 Other fluid overload: Secondary | ICD-10-CM | POA: Diagnosis not present

## 2019-09-01 DIAGNOSIS — D631 Anemia in chronic kidney disease: Secondary | ICD-10-CM | POA: Diagnosis not present

## 2019-09-01 DIAGNOSIS — N186 End stage renal disease: Secondary | ICD-10-CM | POA: Diagnosis not present

## 2019-09-03 DIAGNOSIS — E8779 Other fluid overload: Secondary | ICD-10-CM | POA: Diagnosis not present

## 2019-09-03 DIAGNOSIS — N186 End stage renal disease: Secondary | ICD-10-CM | POA: Diagnosis not present

## 2019-09-03 DIAGNOSIS — D631 Anemia in chronic kidney disease: Secondary | ICD-10-CM | POA: Diagnosis not present

## 2019-09-04 DIAGNOSIS — N186 End stage renal disease: Secondary | ICD-10-CM | POA: Diagnosis not present

## 2019-09-04 DIAGNOSIS — Z992 Dependence on renal dialysis: Secondary | ICD-10-CM | POA: Diagnosis not present

## 2019-09-06 DIAGNOSIS — D631 Anemia in chronic kidney disease: Secondary | ICD-10-CM | POA: Diagnosis not present

## 2019-09-06 DIAGNOSIS — M109 Gout, unspecified: Secondary | ICD-10-CM | POA: Diagnosis not present

## 2019-09-06 DIAGNOSIS — D509 Iron deficiency anemia, unspecified: Secondary | ICD-10-CM | POA: Diagnosis not present

## 2019-09-06 DIAGNOSIS — E119 Type 2 diabetes mellitus without complications: Secondary | ICD-10-CM | POA: Diagnosis not present

## 2019-09-06 DIAGNOSIS — E8779 Other fluid overload: Secondary | ICD-10-CM | POA: Diagnosis not present

## 2019-09-06 DIAGNOSIS — N186 End stage renal disease: Secondary | ICD-10-CM | POA: Diagnosis not present

## 2019-09-07 DIAGNOSIS — H5203 Hypermetropia, bilateral: Secondary | ICD-10-CM | POA: Diagnosis not present

## 2019-09-07 DIAGNOSIS — D631 Anemia in chronic kidney disease: Secondary | ICD-10-CM | POA: Diagnosis not present

## 2019-09-07 DIAGNOSIS — E113393 Type 2 diabetes mellitus with moderate nonproliferative diabetic retinopathy without macular edema, bilateral: Secondary | ICD-10-CM | POA: Diagnosis not present

## 2019-09-07 DIAGNOSIS — E8779 Other fluid overload: Secondary | ICD-10-CM | POA: Diagnosis not present

## 2019-09-07 DIAGNOSIS — D509 Iron deficiency anemia, unspecified: Secondary | ICD-10-CM | POA: Diagnosis not present

## 2019-09-07 DIAGNOSIS — N186 End stage renal disease: Secondary | ICD-10-CM | POA: Diagnosis not present

## 2019-09-07 DIAGNOSIS — H401111 Primary open-angle glaucoma, right eye, mild stage: Secondary | ICD-10-CM | POA: Diagnosis not present

## 2019-09-07 DIAGNOSIS — H401123 Primary open-angle glaucoma, left eye, severe stage: Secondary | ICD-10-CM | POA: Diagnosis not present

## 2019-09-07 DIAGNOSIS — Z961 Presence of intraocular lens: Secondary | ICD-10-CM | POA: Diagnosis not present

## 2019-09-08 DIAGNOSIS — D631 Anemia in chronic kidney disease: Secondary | ICD-10-CM | POA: Diagnosis not present

## 2019-09-08 DIAGNOSIS — N186 End stage renal disease: Secondary | ICD-10-CM | POA: Diagnosis not present

## 2019-09-08 DIAGNOSIS — E8779 Other fluid overload: Secondary | ICD-10-CM | POA: Diagnosis not present

## 2019-09-08 DIAGNOSIS — D509 Iron deficiency anemia, unspecified: Secondary | ICD-10-CM | POA: Diagnosis not present

## 2019-09-09 ENCOUNTER — Telehealth: Payer: Self-pay | Admitting: Cardiology

## 2019-09-09 ENCOUNTER — Other Ambulatory Visit: Payer: Self-pay

## 2019-09-09 DIAGNOSIS — D631 Anemia in chronic kidney disease: Secondary | ICD-10-CM | POA: Diagnosis not present

## 2019-09-09 DIAGNOSIS — H524 Presbyopia: Secondary | ICD-10-CM | POA: Diagnosis not present

## 2019-09-09 DIAGNOSIS — I2510 Atherosclerotic heart disease of native coronary artery without angina pectoris: Secondary | ICD-10-CM | POA: Diagnosis not present

## 2019-09-09 DIAGNOSIS — N186 End stage renal disease: Secondary | ICD-10-CM | POA: Diagnosis not present

## 2019-09-09 DIAGNOSIS — E1151 Type 2 diabetes mellitus with diabetic peripheral angiopathy without gangrene: Secondary | ICD-10-CM | POA: Diagnosis not present

## 2019-09-09 DIAGNOSIS — I5042 Chronic combined systolic (congestive) and diastolic (congestive) heart failure: Secondary | ICD-10-CM | POA: Diagnosis not present

## 2019-09-09 DIAGNOSIS — H52209 Unspecified astigmatism, unspecified eye: Secondary | ICD-10-CM | POA: Diagnosis not present

## 2019-09-09 DIAGNOSIS — I132 Hypertensive heart and chronic kidney disease with heart failure and with stage 5 chronic kidney disease, or end stage renal disease: Secondary | ICD-10-CM | POA: Diagnosis not present

## 2019-09-09 DIAGNOSIS — E1122 Type 2 diabetes mellitus with diabetic chronic kidney disease: Secondary | ICD-10-CM | POA: Diagnosis not present

## 2019-09-09 DIAGNOSIS — H5203 Hypermetropia, bilateral: Secondary | ICD-10-CM | POA: Diagnosis not present

## 2019-09-09 DIAGNOSIS — U071 COVID-19: Secondary | ICD-10-CM | POA: Diagnosis not present

## 2019-09-09 DIAGNOSIS — I252 Old myocardial infarction: Secondary | ICD-10-CM | POA: Diagnosis not present

## 2019-09-09 NOTE — Telephone Encounter (Signed)
Dr. [name], Dorothea Ogle Physical Therapist with [facility] is calling to ask you for a verbal order to continue in home PT on this pt.  You originally ordered this and she has been benefiting from this.  Dorothea Ogle would like a verbal order to continue PT once weekly for an additional 8 weeks on this pt.  Please advise on this. Thanks!

## 2019-09-09 NOTE — Telephone Encounter (Signed)
Monica [name],  [facility]  PT was hoping to get verbal orders to continue in home PT for this patient once weekly for an additional 8 weeks.  The phone number provided is his cell phone, and the office can leave a detailed message on his voicemail if he does not answer

## 2019-09-09 NOTE — Telephone Encounter (Signed)
Left a detailed message on Physical Therapist Tyler's confirmed VM (per Dorothea Ogle he gave the OK to leave detailed message with verbal order), that per Dr. [name], she gave the ok for verbal orders for him to continue PT on the pt, at the requested number of visits and duration. Left a detailed message on Tyler's confirmed VM that he can fax any paperwork needed for Dr. [name] to sign on this, and when she returns to the office in a week, I will have her sign these and fax them back to him shortly thereafter.  Left a detailed message for Dorothea Ogle to call the office back, if any further assistance is needed with this matter.

## 2019-09-09 NOTE — Patient Outreach (Signed)
[facility]) Care Management  09/09/2019  Monica [name] January 01, 1931 [id number]   Telephone Assessment    Outreach attempt to patient. No answer at present.      Plan: RN CM will make outreach attempt to patient within 3-4 business days.   Enzo Montgomery, RN,BSN,CCM [place] Management Telephonic Care Management Coordinator Direct Phone: [phone] Toll Free: [phone] Fax: [phone]

## 2019-09-09 NOTE — Telephone Encounter (Signed)
Yes! Please place the orders, thank  you

## 2019-09-10 DIAGNOSIS — D509 Iron deficiency anemia, unspecified: Secondary | ICD-10-CM | POA: Diagnosis not present

## 2019-09-10 DIAGNOSIS — N186 End stage renal disease: Secondary | ICD-10-CM | POA: Diagnosis not present

## 2019-09-10 DIAGNOSIS — D631 Anemia in chronic kidney disease: Secondary | ICD-10-CM | POA: Diagnosis not present

## 2019-09-10 DIAGNOSIS — E8779 Other fluid overload: Secondary | ICD-10-CM | POA: Diagnosis not present

## 2019-09-13 DIAGNOSIS — E8779 Other fluid overload: Secondary | ICD-10-CM | POA: Diagnosis not present

## 2019-09-13 DIAGNOSIS — D509 Iron deficiency anemia, unspecified: Secondary | ICD-10-CM | POA: Diagnosis not present

## 2019-09-13 DIAGNOSIS — N186 End stage renal disease: Secondary | ICD-10-CM | POA: Diagnosis not present

## 2019-09-13 DIAGNOSIS — D631 Anemia in chronic kidney disease: Secondary | ICD-10-CM | POA: Diagnosis not present

## 2019-09-13 DIAGNOSIS — E1122 Type 2 diabetes mellitus with diabetic chronic kidney disease: Secondary | ICD-10-CM | POA: Diagnosis not present

## 2019-09-13 DIAGNOSIS — U071 COVID-19: Secondary | ICD-10-CM | POA: Diagnosis not present

## 2019-09-13 DIAGNOSIS — E1151 Type 2 diabetes mellitus with diabetic peripheral angiopathy without gangrene: Secondary | ICD-10-CM | POA: Diagnosis not present

## 2019-09-13 DIAGNOSIS — I252 Old myocardial infarction: Secondary | ICD-10-CM | POA: Diagnosis not present

## 2019-09-13 DIAGNOSIS — I2510 Atherosclerotic heart disease of native coronary artery without angina pectoris: Secondary | ICD-10-CM | POA: Diagnosis not present

## 2019-09-13 DIAGNOSIS — I132 Hypertensive heart and chronic kidney disease with heart failure and with stage 5 chronic kidney disease, or end stage renal disease: Secondary | ICD-10-CM | POA: Diagnosis not present

## 2019-09-13 DIAGNOSIS — I5042 Chronic combined systolic (congestive) and diastolic (congestive) heart failure: Secondary | ICD-10-CM | POA: Diagnosis not present

## 2019-09-14 ENCOUNTER — Other Ambulatory Visit: Payer: Self-pay

## 2019-09-14 DIAGNOSIS — I132 Hypertensive heart and chronic kidney disease with heart failure and with stage 5 chronic kidney disease, or end stage renal disease: Secondary | ICD-10-CM | POA: Diagnosis not present

## 2019-09-14 DIAGNOSIS — I5042 Chronic combined systolic (congestive) and diastolic (congestive) heart failure: Secondary | ICD-10-CM | POA: Diagnosis not present

## 2019-09-14 DIAGNOSIS — E785 Hyperlipidemia, unspecified: Secondary | ICD-10-CM | POA: Diagnosis not present

## 2019-09-14 DIAGNOSIS — I2510 Atherosclerotic heart disease of native coronary artery without angina pectoris: Secondary | ICD-10-CM | POA: Diagnosis not present

## 2019-09-14 DIAGNOSIS — N186 End stage renal disease: Secondary | ICD-10-CM | POA: Diagnosis not present

## 2019-09-14 DIAGNOSIS — E1151 Type 2 diabetes mellitus with diabetic peripheral angiopathy without gangrene: Secondary | ICD-10-CM | POA: Diagnosis not present

## 2019-09-14 DIAGNOSIS — D631 Anemia in chronic kidney disease: Secondary | ICD-10-CM | POA: Diagnosis not present

## 2019-09-14 DIAGNOSIS — E1122 Type 2 diabetes mellitus with diabetic chronic kidney disease: Secondary | ICD-10-CM | POA: Diagnosis not present

## 2019-09-14 DIAGNOSIS — U071 COVID-19: Secondary | ICD-10-CM | POA: Diagnosis not present

## 2019-09-14 DIAGNOSIS — I10 Essential (primary) hypertension: Secondary | ICD-10-CM | POA: Diagnosis not present

## 2019-09-14 DIAGNOSIS — E1165 Type 2 diabetes mellitus with hyperglycemia: Secondary | ICD-10-CM | POA: Diagnosis not present

## 2019-09-14 DIAGNOSIS — I252 Old myocardial infarction: Secondary | ICD-10-CM | POA: Diagnosis not present

## 2019-09-14 NOTE — Patient Outreach (Addendum)
[place] [facility]) Care Management  09/14/2019  Monica [name] 1931-02-16 [id number]   Telephone Assessment   Outreach attempt # 1 to patient. Spoke with patient who reports she has been doing fairly well. She states that her weight went up slightly and she had to do an extra HD treatment. She reports wgt is back down to 166 lbs. Patient states she did have an episode of chest pain yesterday after returning hem from HD. She took one Nitro and rested and sxs were relieved. She reports no other events. Patient aware of s/s of worsening condition and when to seek medical attention. She continues to get HHPT and voices she is making progress. She goes to see PCP on Friday of this week. Her children remain available to assist her with her needs. Daughter currently in the home at present. She denies any RN CM needs or concerns at this time.  Medications Reviewed Today    Reviewed by Hayden Pedro, RN (Registered Nurse) on 09/14/19 at [phone]  Med List Status: <None>  Medication Order Taking? Sig Documenting Provider Last Dose Status Informant  acetaminophen (TYLENOL) 500 MG tablet [id number] No Take 1 tablet (500 mg total) by mouth every 6 (six) hours as needed for mild pain or headache. Eugenie Filler, MD Taking Active Family Member  albuterol (PROVENTIL HFA;VENTOLIN HFA) 108 [phone] Base) MCG/ACT inhaler [id number] No Inhale 2 puffs into the lungs every 6 (six) hours as needed for wheezing or shortness of breath. [provider] Taking Active Family Member  allopurinol (ZYLOPRIM) 100 MG tablet [id number] No Take 100 mg by mouth 2 (two) times daily.  [provider] Taking Active Family Member  aspirin EC 81 MG EC tablet [id number] No Take 1 tablet (81 mg total) by mouth daily. Barrett, Lodema Hong, PA-C Taking Active Family Member  atorvastatin (LIPITOR) 80 MG tablet [id number] No Take 80 mg by mouth daily at 6 PM.  [provider] Taking Active Family Member   brimonidine (ALPHAGAN P) 0.1 % SOLN [id number] No Place 1 drop into both eyes 2 (two) times daily. [provider] Taking Active Family Member  Cholecalciferol (VITAMIN D-3) 1000 units CAPS [id number] No Take 1,000 Units by mouth daily.  [provider] Taking Active Family Member  clopidogrel (PLAVIX) 75 MG tablet [id number]  Take 1 tablet (75 mg total) by mouth daily. Charlie Pitter, PA-C  Active   Cranberry 500 MG CAPS [id number] No Take 1 capsule by mouth daily. [provider] Taking Active Family Member  diphenhydrAMINE (BENADRYL) 25 mg capsule [id number] No Take 25 mg by mouth as needed. [provider] Taking Active Family Member  ferric citrate (AURYXIA) 1 GM 210 MG(Fe) tablet [id number] No Take 210 mg by mouth 3 (three) times daily with meals.  [provider] Taking Active Family Member  Insulin Degludec [facility] [place]) [id number] No Inject 20 Units into the skin daily. [provider] Taking Active Family Member  isosorbide mononitrate (IMDUR) 60 MG 24 hr tablet [id number] No Take 1 tablet (60 mg total) by mouth 2 (two) times daily. Charlie Pitter, PA-C Taking Active   latanoprost (XALATAN) 0.005 % ophthalmic solution [id number] No Place 1 drop into both eyes at bedtime. [provider] Taking Active Family Member  loratadine (CLARITIN) 10 MG tablet [id number] No Take 10 mg by mouth as needed for allergies.  [provider] Taking Active Family Member  Melatonin 5 MG CAPS [id number] No Take 5 mg by mouth at bedtime.  [provider] Taking Active Family Member  multivitamin (RENA-VIT) TABS tablet [id number] No Take 1 tablet by mouth daily. [provider] Taking Active Family Member  nitroGLYCERIN (NITROSTAT) 0.4 MG SL tablet [id number] No Place 1 tablet (0.4 mg total) under the tongue every 5 (five) minutes as needed for chest pain. Barrett, Evelene Croon, PA-C Taking Active Family Member  pantoprazole (PROTONIX)  40 MG tablet [id number]  Take 1 tablet (40 mg total) by mouth daily. Charlie Pitter, PA-C  Active   Probiotic Product (PROBIOTIC PO) [id number] No Take 1 tablet by mouth daily. 50 billion cfus [provider] Taking Active Family Member  Med List Note Gentry Roch, CPhT 03/17/19 1056): Dialysis  Tuesday, Thursday and saturday           [facility] CM Care Plan Problem One     Most Recent Value  Care Plan Problem One  Patient with impaired cardiac status/functioning  Role Documenting the Problem One  Care Management Telephonic Coordinator  Care Plan for Problem One  Active  [facility] CM Short Term Goal #1   Patient will report no CP or other cardiac sxs over the next 30 days.  [facility] CM Short Term Goal #1 Start Date  07/15/19  Interventions for Short Term Goal #1  RN CM assessed for CP freq and preciptating factors. RNCM reviewed action plan with pt.   [facility] CM Short Term Goal #2   Patient will report maintaining wgt within 2-3 lbs of HD target wgt over the next 30 days  [facility] CM Short Term Goal #2 Start Date  09/14/19  Interventions for Short Term Goal #2  RNCM assessed and discussed wgt gain, causes and ways to manage. RN CM reinforced renal diet. RNCM confirmed pt aware to report abnormal wgt gain.    [facility] CM Care Plan Problem Two     Most Recent Value  Care Plan Problem Two  Patient with weakness and decconditioning.  Role Documenting the Problem Two  Care Management Telephonic [place] for Problem Two  Active  Interventions for Problem Two Long Term Goal   RNCM assessed for progress and status of HHPT-pt has been improved for 8more wks.  [facility] Long Term Goal  Patient will report completing HHPT over the next 60 days.  [facility] Long Term Goal Start Date  07/15/19      Plan: RN CM discussed with patient next outreach within the month of April. Patient gave verbal consent and in agreement with RN CM follow up and timeframe. Patient aware that they may contact RN CM sooner for any  issues or concerns.  RN CM will send quarterly update to PCP.  Enzo Montgomery, RN,BSN,CCM [place] Management Telephonic Care Management Coordinator Direct Phone: [phone] Toll Free: [phone] Fax: [phone]

## 2019-09-14 NOTE — Telephone Encounter (Signed)
Received orders from [facility] by Dorothea Ogle PT for Dr. [name] to review, sign, and fax back, upon return to the office. This was in regards to verbal orders Dr. [name] gave Dorothea Ogle PT to extend his home visits on the pt for once weekly for an additional 8 weeks.  Orders placed in Dr. Francesca Oman folder to sign.  Once signed, our office will fax thereafter.

## 2019-09-15 DIAGNOSIS — D509 Iron deficiency anemia, unspecified: Secondary | ICD-10-CM | POA: Diagnosis not present

## 2019-09-15 DIAGNOSIS — N186 End stage renal disease: Secondary | ICD-10-CM | POA: Diagnosis not present

## 2019-09-15 DIAGNOSIS — D631 Anemia in chronic kidney disease: Secondary | ICD-10-CM | POA: Diagnosis not present

## 2019-09-15 DIAGNOSIS — E8779 Other fluid overload: Secondary | ICD-10-CM | POA: Diagnosis not present

## 2019-09-16 DIAGNOSIS — Z1389 Encounter for screening for other disorder: Secondary | ICD-10-CM | POA: Diagnosis not present

## 2019-09-16 DIAGNOSIS — J45909 Unspecified asthma, uncomplicated: Secondary | ICD-10-CM | POA: Diagnosis not present

## 2019-09-16 DIAGNOSIS — Z131 Encounter for screening for diabetes mellitus: Secondary | ICD-10-CM | POA: Diagnosis not present

## 2019-09-16 DIAGNOSIS — I119 Hypertensive heart disease without heart failure: Secondary | ICD-10-CM | POA: Diagnosis not present

## 2019-09-16 DIAGNOSIS — M1A49X0 Other secondary chronic gout, multiple sites, without tophus (tophi): Secondary | ICD-10-CM | POA: Diagnosis not present

## 2019-09-16 DIAGNOSIS — Z1329 Encounter for screening for other suspected endocrine disorder: Secondary | ICD-10-CM | POA: Diagnosis not present

## 2019-09-16 DIAGNOSIS — E782 Mixed hyperlipidemia: Secondary | ICD-10-CM | POA: Diagnosis not present

## 2019-09-16 DIAGNOSIS — J4540 Moderate persistent asthma, uncomplicated: Secondary | ICD-10-CM | POA: Diagnosis not present

## 2019-09-16 DIAGNOSIS — E1151 Type 2 diabetes mellitus with diabetic peripheral angiopathy without gangrene: Secondary | ICD-10-CM | POA: Diagnosis not present

## 2019-09-16 DIAGNOSIS — Z136 Encounter for screening for cardiovascular disorders: Secondary | ICD-10-CM | POA: Diagnosis not present

## 2019-09-16 DIAGNOSIS — I5043 Acute on chronic combined systolic (congestive) and diastolic (congestive) heart failure: Secondary | ICD-10-CM | POA: Diagnosis not present

## 2019-09-16 DIAGNOSIS — Z0001 Encounter for general adult medical examination with abnormal findings: Secondary | ICD-10-CM | POA: Diagnosis not present

## 2019-09-17 DIAGNOSIS — N186 End stage renal disease: Secondary | ICD-10-CM | POA: Diagnosis not present

## 2019-09-17 DIAGNOSIS — D509 Iron deficiency anemia, unspecified: Secondary | ICD-10-CM | POA: Diagnosis not present

## 2019-09-17 DIAGNOSIS — E8779 Other fluid overload: Secondary | ICD-10-CM | POA: Diagnosis not present

## 2019-09-17 DIAGNOSIS — D631 Anemia in chronic kidney disease: Secondary | ICD-10-CM | POA: Diagnosis not present

## 2019-09-20 DIAGNOSIS — E8779 Other fluid overload: Secondary | ICD-10-CM | POA: Diagnosis not present

## 2019-09-20 DIAGNOSIS — N186 End stage renal disease: Secondary | ICD-10-CM | POA: Diagnosis not present

## 2019-09-20 DIAGNOSIS — D631 Anemia in chronic kidney disease: Secondary | ICD-10-CM | POA: Diagnosis not present

## 2019-09-20 DIAGNOSIS — D509 Iron deficiency anemia, unspecified: Secondary | ICD-10-CM | POA: Diagnosis not present

## 2019-09-21 DIAGNOSIS — I252 Old myocardial infarction: Secondary | ICD-10-CM | POA: Diagnosis not present

## 2019-09-21 DIAGNOSIS — E1122 Type 2 diabetes mellitus with diabetic chronic kidney disease: Secondary | ICD-10-CM | POA: Diagnosis not present

## 2019-09-21 DIAGNOSIS — I132 Hypertensive heart and chronic kidney disease with heart failure and with stage 5 chronic kidney disease, or end stage renal disease: Secondary | ICD-10-CM | POA: Diagnosis not present

## 2019-09-21 DIAGNOSIS — E1151 Type 2 diabetes mellitus with diabetic peripheral angiopathy without gangrene: Secondary | ICD-10-CM | POA: Diagnosis not present

## 2019-09-21 DIAGNOSIS — N186 End stage renal disease: Secondary | ICD-10-CM | POA: Diagnosis not present

## 2019-09-21 DIAGNOSIS — I2510 Atherosclerotic heart disease of native coronary artery without angina pectoris: Secondary | ICD-10-CM | POA: Diagnosis not present

## 2019-09-21 DIAGNOSIS — I5042 Chronic combined systolic (congestive) and diastolic (congestive) heart failure: Secondary | ICD-10-CM | POA: Diagnosis not present

## 2019-09-21 DIAGNOSIS — U071 COVID-19: Secondary | ICD-10-CM | POA: Diagnosis not present

## 2019-09-21 DIAGNOSIS — D631 Anemia in chronic kidney disease: Secondary | ICD-10-CM | POA: Diagnosis not present

## 2019-09-22 DIAGNOSIS — D631 Anemia in chronic kidney disease: Secondary | ICD-10-CM | POA: Diagnosis not present

## 2019-09-22 DIAGNOSIS — N186 End stage renal disease: Secondary | ICD-10-CM | POA: Diagnosis not present

## 2019-09-22 DIAGNOSIS — D509 Iron deficiency anemia, unspecified: Secondary | ICD-10-CM | POA: Diagnosis not present

## 2019-09-22 DIAGNOSIS — E8779 Other fluid overload: Secondary | ICD-10-CM | POA: Diagnosis not present

## 2019-09-23 DIAGNOSIS — N186 End stage renal disease: Secondary | ICD-10-CM | POA: Diagnosis not present

## 2019-09-23 DIAGNOSIS — D509 Iron deficiency anemia, unspecified: Secondary | ICD-10-CM | POA: Diagnosis not present

## 2019-09-23 DIAGNOSIS — D631 Anemia in chronic kidney disease: Secondary | ICD-10-CM | POA: Diagnosis not present

## 2019-09-23 DIAGNOSIS — E8779 Other fluid overload: Secondary | ICD-10-CM | POA: Diagnosis not present

## 2019-09-24 DIAGNOSIS — E8779 Other fluid overload: Secondary | ICD-10-CM | POA: Diagnosis not present

## 2019-09-24 DIAGNOSIS — D631 Anemia in chronic kidney disease: Secondary | ICD-10-CM | POA: Diagnosis not present

## 2019-09-24 DIAGNOSIS — N186 End stage renal disease: Secondary | ICD-10-CM | POA: Diagnosis not present

## 2019-09-24 DIAGNOSIS — D509 Iron deficiency anemia, unspecified: Secondary | ICD-10-CM | POA: Diagnosis not present

## 2019-09-27 DIAGNOSIS — D509 Iron deficiency anemia, unspecified: Secondary | ICD-10-CM | POA: Diagnosis not present

## 2019-09-27 DIAGNOSIS — E8779 Other fluid overload: Secondary | ICD-10-CM | POA: Diagnosis not present

## 2019-09-27 DIAGNOSIS — D631 Anemia in chronic kidney disease: Secondary | ICD-10-CM | POA: Diagnosis not present

## 2019-09-27 DIAGNOSIS — N186 End stage renal disease: Secondary | ICD-10-CM | POA: Diagnosis not present

## 2019-09-28 DIAGNOSIS — E1151 Type 2 diabetes mellitus with diabetic peripheral angiopathy without gangrene: Secondary | ICD-10-CM | POA: Diagnosis not present

## 2019-09-28 DIAGNOSIS — I132 Hypertensive heart and chronic kidney disease with heart failure and with stage 5 chronic kidney disease, or end stage renal disease: Secondary | ICD-10-CM | POA: Diagnosis not present

## 2019-09-28 DIAGNOSIS — I5042 Chronic combined systolic (congestive) and diastolic (congestive) heart failure: Secondary | ICD-10-CM | POA: Diagnosis not present

## 2019-09-28 DIAGNOSIS — E1122 Type 2 diabetes mellitus with diabetic chronic kidney disease: Secondary | ICD-10-CM | POA: Diagnosis not present

## 2019-09-28 DIAGNOSIS — U071 COVID-19: Secondary | ICD-10-CM | POA: Diagnosis not present

## 2019-09-28 DIAGNOSIS — N186 End stage renal disease: Secondary | ICD-10-CM | POA: Diagnosis not present

## 2019-09-28 DIAGNOSIS — I252 Old myocardial infarction: Secondary | ICD-10-CM | POA: Diagnosis not present

## 2019-09-28 DIAGNOSIS — D631 Anemia in chronic kidney disease: Secondary | ICD-10-CM | POA: Diagnosis not present

## 2019-09-28 DIAGNOSIS — I2510 Atherosclerotic heart disease of native coronary artery without angina pectoris: Secondary | ICD-10-CM | POA: Diagnosis not present

## 2019-09-29 DIAGNOSIS — D509 Iron deficiency anemia, unspecified: Secondary | ICD-10-CM | POA: Diagnosis not present

## 2019-09-29 DIAGNOSIS — E8779 Other fluid overload: Secondary | ICD-10-CM | POA: Diagnosis not present

## 2019-09-29 DIAGNOSIS — D631 Anemia in chronic kidney disease: Secondary | ICD-10-CM | POA: Diagnosis not present

## 2019-09-29 DIAGNOSIS — N186 End stage renal disease: Secondary | ICD-10-CM | POA: Diagnosis not present

## 2019-10-01 DIAGNOSIS — D631 Anemia in chronic kidney disease: Secondary | ICD-10-CM | POA: Diagnosis not present

## 2019-10-01 DIAGNOSIS — D509 Iron deficiency anemia, unspecified: Secondary | ICD-10-CM | POA: Diagnosis not present

## 2019-10-01 DIAGNOSIS — E8779 Other fluid overload: Secondary | ICD-10-CM | POA: Diagnosis not present

## 2019-10-01 DIAGNOSIS — N186 End stage renal disease: Secondary | ICD-10-CM | POA: Diagnosis not present

## 2019-10-04 DIAGNOSIS — E8779 Other fluid overload: Secondary | ICD-10-CM | POA: Diagnosis not present

## 2019-10-04 DIAGNOSIS — D631 Anemia in chronic kidney disease: Secondary | ICD-10-CM | POA: Diagnosis not present

## 2019-10-04 DIAGNOSIS — N186 End stage renal disease: Secondary | ICD-10-CM | POA: Diagnosis not present

## 2019-10-04 DIAGNOSIS — D509 Iron deficiency anemia, unspecified: Secondary | ICD-10-CM | POA: Diagnosis not present

## 2019-10-05 DIAGNOSIS — Z992 Dependence on renal dialysis: Secondary | ICD-10-CM | POA: Diagnosis not present

## 2019-10-05 DIAGNOSIS — N186 End stage renal disease: Secondary | ICD-10-CM | POA: Diagnosis not present

## 2019-10-06 DIAGNOSIS — D631 Anemia in chronic kidney disease: Secondary | ICD-10-CM | POA: Diagnosis not present

## 2019-10-06 DIAGNOSIS — N2581 Secondary hyperparathyroidism of renal origin: Secondary | ICD-10-CM | POA: Diagnosis not present

## 2019-10-06 DIAGNOSIS — N186 End stage renal disease: Secondary | ICD-10-CM | POA: Diagnosis not present

## 2019-10-06 DIAGNOSIS — Z23 Encounter for immunization: Secondary | ICD-10-CM | POA: Diagnosis not present

## 2019-10-06 DIAGNOSIS — E785 Hyperlipidemia, unspecified: Secondary | ICD-10-CM | POA: Diagnosis not present

## 2019-10-07 ENCOUNTER — Telehealth: Payer: Self-pay | Admitting: Cardiology

## 2019-10-07 DIAGNOSIS — I2510 Atherosclerotic heart disease of native coronary artery without angina pectoris: Secondary | ICD-10-CM | POA: Diagnosis not present

## 2019-10-07 DIAGNOSIS — E1122 Type 2 diabetes mellitus with diabetic chronic kidney disease: Secondary | ICD-10-CM | POA: Diagnosis not present

## 2019-10-07 DIAGNOSIS — I252 Old myocardial infarction: Secondary | ICD-10-CM | POA: Diagnosis not present

## 2019-10-07 DIAGNOSIS — M7742 Metatarsalgia, left foot: Secondary | ICD-10-CM | POA: Diagnosis not present

## 2019-10-07 DIAGNOSIS — I5042 Chronic combined systolic (congestive) and diastolic (congestive) heart failure: Secondary | ICD-10-CM | POA: Diagnosis not present

## 2019-10-07 DIAGNOSIS — M7741 Metatarsalgia, right foot: Secondary | ICD-10-CM | POA: Diagnosis not present

## 2019-10-07 DIAGNOSIS — E1151 Type 2 diabetes mellitus with diabetic peripheral angiopathy without gangrene: Secondary | ICD-10-CM | POA: Diagnosis not present

## 2019-10-07 DIAGNOSIS — I132 Hypertensive heart and chronic kidney disease with heart failure and with stage 5 chronic kidney disease, or end stage renal disease: Secondary | ICD-10-CM | POA: Diagnosis not present

## 2019-10-07 DIAGNOSIS — D631 Anemia in chronic kidney disease: Secondary | ICD-10-CM | POA: Diagnosis not present

## 2019-10-07 DIAGNOSIS — B351 Tinea unguium: Secondary | ICD-10-CM | POA: Diagnosis not present

## 2019-10-07 DIAGNOSIS — N186 End stage renal disease: Secondary | ICD-10-CM | POA: Diagnosis not present

## 2019-10-07 DIAGNOSIS — U071 COVID-19: Secondary | ICD-10-CM | POA: Diagnosis not present

## 2019-10-07 NOTE — Telephone Encounter (Signed)
Spoke with Dorothea Ogle from [place] who states that he was seeing the patient for PT and she told him that she fell last week. She was in the kitchen when she turned around and her knee buckled and she fell on her right side. The patient was able to get up on her own and did not notify anyone of this until today. Dorothea Ogle states that he evaluated her and there is no major bruising or injury. Patient just complaining of some right hip stiffness and soreness. Dorothea Ogle has notified the patient's PCP as well.

## 2019-10-07 NOTE — Telephone Encounter (Signed)
Tyler from [facility] is calling to report that Monica [name] informed them today that she had a fall last week. He states there is no major bruising or injuries, but she is complaining that her right hip is sore. He states she was able to get up on her own so no EMS was called. If Dorothea Ogle is unable to answer when calling back due to him being with a patient a detailed message can be left.

## 2019-10-08 DIAGNOSIS — N2581 Secondary hyperparathyroidism of renal origin: Secondary | ICD-10-CM | POA: Diagnosis not present

## 2019-10-08 DIAGNOSIS — N186 End stage renal disease: Secondary | ICD-10-CM | POA: Diagnosis not present

## 2019-10-08 DIAGNOSIS — E785 Hyperlipidemia, unspecified: Secondary | ICD-10-CM | POA: Diagnosis not present

## 2019-10-08 DIAGNOSIS — D631 Anemia in chronic kidney disease: Secondary | ICD-10-CM | POA: Diagnosis not present

## 2019-10-08 DIAGNOSIS — Z23 Encounter for immunization: Secondary | ICD-10-CM | POA: Diagnosis not present

## 2019-10-11 DIAGNOSIS — N186 End stage renal disease: Secondary | ICD-10-CM | POA: Diagnosis not present

## 2019-10-11 DIAGNOSIS — E785 Hyperlipidemia, unspecified: Secondary | ICD-10-CM | POA: Diagnosis not present

## 2019-10-11 DIAGNOSIS — N2581 Secondary hyperparathyroidism of renal origin: Secondary | ICD-10-CM | POA: Diagnosis not present

## 2019-10-11 DIAGNOSIS — D631 Anemia in chronic kidney disease: Secondary | ICD-10-CM | POA: Diagnosis not present

## 2019-10-11 DIAGNOSIS — Z23 Encounter for immunization: Secondary | ICD-10-CM | POA: Diagnosis not present

## 2019-10-12 ENCOUNTER — Emergency Department (HOSPITAL_BASED_OUTPATIENT_CLINIC_OR_DEPARTMENT_OTHER): Payer: Medicare HMO

## 2019-10-12 ENCOUNTER — Encounter (HOSPITAL_BASED_OUTPATIENT_CLINIC_OR_DEPARTMENT_OTHER): Payer: Self-pay

## 2019-10-12 ENCOUNTER — Emergency Department (HOSPITAL_BASED_OUTPATIENT_CLINIC_OR_DEPARTMENT_OTHER)
Admission: EM | Admit: 2019-10-12 | Discharge: 2019-10-12 | Disposition: A | Discharge status: Home / Self Care | Payer: Medicare HMO | Attending: Emergency Medicine | Admitting: Emergency Medicine

## 2019-10-12 ENCOUNTER — Other Ambulatory Visit: Payer: Self-pay

## 2019-10-12 DIAGNOSIS — E119 Type 2 diabetes mellitus without complications: Secondary | ICD-10-CM | POA: Diagnosis not present

## 2019-10-12 DIAGNOSIS — Y929 Unspecified place or not applicable: Secondary | ICD-10-CM | POA: Diagnosis not present

## 2019-10-12 DIAGNOSIS — Z7982 Long term (current) use of aspirin: Secondary | ICD-10-CM | POA: Diagnosis not present

## 2019-10-12 DIAGNOSIS — Z794 Long term (current) use of insulin: Secondary | ICD-10-CM | POA: Diagnosis not present

## 2019-10-12 DIAGNOSIS — I252 Old myocardial infarction: Secondary | ICD-10-CM | POA: Diagnosis not present

## 2019-10-12 DIAGNOSIS — Z992 Dependence on renal dialysis: Secondary | ICD-10-CM | POA: Diagnosis not present

## 2019-10-12 DIAGNOSIS — I5042 Chronic combined systolic (congestive) and diastolic (congestive) heart failure: Secondary | ICD-10-CM | POA: Insufficient documentation

## 2019-10-12 DIAGNOSIS — R079 Chest pain, unspecified: Secondary | ICD-10-CM | POA: Insufficient documentation

## 2019-10-12 DIAGNOSIS — E1122 Type 2 diabetes mellitus with diabetic chronic kidney disease: Secondary | ICD-10-CM | POA: Diagnosis not present

## 2019-10-12 DIAGNOSIS — Y999 Unspecified external cause status: Secondary | ICD-10-CM | POA: Diagnosis not present

## 2019-10-12 DIAGNOSIS — Z951 Presence of aortocoronary bypass graft: Secondary | ICD-10-CM | POA: Diagnosis not present

## 2019-10-12 DIAGNOSIS — S32591A Other specified fracture of right pubis, initial encounter for closed fracture: Secondary | ICD-10-CM | POA: Diagnosis not present

## 2019-10-12 DIAGNOSIS — U071 COVID-19: Secondary | ICD-10-CM | POA: Diagnosis not present

## 2019-10-12 DIAGNOSIS — Z7901 Long term (current) use of anticoagulants: Secondary | ICD-10-CM | POA: Insufficient documentation

## 2019-10-12 DIAGNOSIS — W19XXXA Unspecified fall, initial encounter: Secondary | ICD-10-CM | POA: Diagnosis not present

## 2019-10-12 DIAGNOSIS — N186 End stage renal disease: Secondary | ICD-10-CM | POA: Diagnosis not present

## 2019-10-12 DIAGNOSIS — S79911A Unspecified injury of right hip, initial encounter: Secondary | ICD-10-CM | POA: Diagnosis not present

## 2019-10-12 DIAGNOSIS — I132 Hypertensive heart and chronic kidney disease with heart failure and with stage 5 chronic kidney disease, or end stage renal disease: Secondary | ICD-10-CM | POA: Insufficient documentation

## 2019-10-12 DIAGNOSIS — Z79899 Other long term (current) drug therapy: Secondary | ICD-10-CM | POA: Insufficient documentation

## 2019-10-12 DIAGNOSIS — Y939 Activity, unspecified: Secondary | ICD-10-CM | POA: Diagnosis not present

## 2019-10-12 DIAGNOSIS — E1151 Type 2 diabetes mellitus with diabetic peripheral angiopathy without gangrene: Secondary | ICD-10-CM | POA: Diagnosis not present

## 2019-10-12 DIAGNOSIS — R0789 Other chest pain: Secondary | ICD-10-CM | POA: Diagnosis not present

## 2019-10-12 DIAGNOSIS — J45909 Unspecified asthma, uncomplicated: Secondary | ICD-10-CM | POA: Insufficient documentation

## 2019-10-12 DIAGNOSIS — I2510 Atherosclerotic heart disease of native coronary artery without angina pectoris: Secondary | ICD-10-CM | POA: Diagnosis not present

## 2019-10-12 DIAGNOSIS — D631 Anemia in chronic kidney disease: Secondary | ICD-10-CM | POA: Diagnosis not present

## 2019-10-12 LAB — TROPONIN I (HIGH SENSITIVITY)
Troponin I (High Sensitivity): 300 ng/L (ref <18)
Troponin I (High Sensitivity): 331 ng/L (ref <18)

## 2019-10-12 LAB — BASIC METABOLIC PANEL
Anion gap: 10 (ref 5–15)
BUN: 40 mg/dL — ABNORMAL HIGH (ref 8–23)
CO2: 30 mmol/L (ref 22–32)
Calcium: 8.8 mg/dL — ABNORMAL LOW (ref 8.9–10.3)
Chloride: 95 mmol/L — ABNORMAL LOW (ref 98–111)
Creatinine, Ser: 6.06 mg/dL — ABNORMAL HIGH (ref 0.44–1.00)
GFR calc Af Amer: 7 mL/min — ABNORMAL LOW (ref >60)
GFR calc non Af Amer: 6 mL/min — ABNORMAL LOW (ref >60)
Glucose, Bld: 168 mg/dL — ABNORMAL HIGH (ref 70–99)
Potassium: 4.7 mmol/L (ref 3.5–5.1)
Sodium: 135 mmol/L (ref 135–145)

## 2019-10-12 LAB — CBC
HCT: 38.8 % (ref 36.0–46.0)
Hemoglobin: 12.1 g/dL (ref 12.0–15.0)
MCH: 30.7 pg (ref 26.0–34.0)
MCHC: 31.2 g/dL (ref 30.0–36.0)
MCV: 98.5 fL (ref 80.0–100.0)
Platelets: 269 10*3/uL (ref 150–400)
RBC: 3.94 MIL/uL (ref 3.87–5.11)
RDW: 17.8 % — ABNORMAL HIGH (ref 11.5–15.5)
WBC: 6 10*3/uL (ref 4.0–10.5)
nRBC: 0 % (ref 0.0–0.2)

## 2019-10-12 MED ORDER — TRAMADOL HCL 50 MG PO TABS: 50.0000 mg | ORAL_TABLET | Freq: Four times a day (QID) | ORAL | 0 refills | Status: DC | PRN

## 2019-10-12 MED ORDER — SODIUM CHLORIDE 0.9% FLUSH
3.0000 mL | Freq: Once | INTRAVENOUS | Status: DC
  Filled 2019-10-12: qty 3

## 2019-10-12 NOTE — Discharge Instructions (Addendum)
Begin taking tramadol as prescribed as needed for pain.  Follow-up with your cardiologist in the next week, and return to the ER if symptoms significantly worsen or change.

## 2019-10-12 NOTE — ED Provider Notes (Signed)
[facility] EMERGENCY DEPARTMENT Provider Note   CSN: [id number] Arrival date & time: 10/12/19  1606     History Chief Complaint  Patient presents with  . Chest Pain  . Fall    Monica [name] is a 86 y.o. female.  Patient is an 86 year old female with extensive past medical history including coronary artery disease with CABG in 2017 with recent stenting in January 2021.  She also has a history of CHF, hypertension, asthma, and diabetes.  She presents today for evaluation of chest and hip pain.  Patient states that she developed discomfort in the center of her chest that she describes as sharp at approximately 1230 last night.  She was able to take 3 nitroglycerin in succession which relieved her discomfort.  She felt somewhat short of breath, but denied nausea or diaphoresis.  This did not feel unlike her prior cardiac issues.  She also reports falling approximately 10 days ago and injuring her right hip.  She has been having pain with ambulation since.  She denies any weakness or numbness.  The history is provided by the patient.  Chest Pain Pain location:  Substernal area Pain quality: sharp   Pain radiates to:  Does not radiate Pain severity:  Moderate Onset quality:  Sudden Duration:  1 hour Timing:  Constant Progression:  Resolved Relieved by:  Nitroglycerin Worsened by:  Nothing Fall Associated symptoms include chest pain.       Past Medical History:  Diagnosis Date  . Anemia   . Asthma   . Chronic combined systolic and diastolic CHF (congestive heart failure) ([place])    a. Echo 11/19 [facility]):  EF 55-65, normal wall motion, normal diastolic function. b. Echo 01/2019 EF 30-35%, moderate MR, mild AI.  Marland Kitchen Coronary artery disease    a. s/p CABG 2017. b.s/p NSTEMI 11/19  [facility]) >> S-PDA 100 >>PCI: DES to S-OM. c. NSTEMI 01/2019 - occluded SVG-distal RCA, severe proximal to stent/ISR of SVG-OM s/p overlapping DES, DES to ostial prox SVG to the RI. d.  NSTEMI 07/2019 s/p DES to ramus ISR.  Marland Kitchen COVID-19 virus infection 05/2019  . ESRD (end stage renal disease)    Dialysis Tu, Th, Sa  . Essential hypertension   . Gastroesophageal reflux disease   . History of blood transfusion   . Hyperlipidemia   . Junctional bradycardia   . Moderate mitral regurgitation   . Peptic ulcer   . Protein calorie malnutrition ([place])   . Sinus node dysfunction (HCC)   . Type 2 diabetes mellitus [facility])     Patient Active Problem List   Diagnosis Date Noted  . Coronary artery disease, occlusive: Occluded native vessels, graft dependent 07/12/2019  . NSTEMI (non-ST elevated myocardial infarction) ([place]) 06/21/2019  . Acute on chronic combined systolic and diastolic CHF (congestive heart failure) ([place]) 03/18/2019  . Anemia of chronic disease 03/17/2019  . ESRD on hemodialysis ([place]) 03/17/2019  . Sinus node dysfunction ([place]) 01/25/2019  . Ischemic cardiomyopathy 01/25/2019  . Hyperkalemia 01/18/2019  . Acute lower UTI 01/18/2019  . Elevated troponin   . Junctional bradycardia   . Bradycardia 01/17/2019  . S/P coronary artery stent placement   . Acute combined systolic and diastolic heart failure ([place])   . Non-ST elevation (NSTEMI) myocardial infarction ([place]) 01/08/2019  . Acute respiratory failure ([place]) 01/07/2019  . Diabetes mellitus with peripheral circulatory disorder ([place]) 02/05/2018  . Altered mental status 03/13/2017  . Claudication in peripheral vascular disease ([place]) 12/29/2016  . Abnormal  weight loss 06/05/2016  . Bilateral pleural effusion 11/23/2015  . Chronic combined systolic and diastolic CHF (congestive heart failure) ([place]) 11/23/2015  . Dyspnea 11/23/2015  . Pleural effusion 11/23/2015  . Coronary artery disease involving coronary bypass graft of native heart with angina pectoris ([place]) 10/05/2015  . S/P CABG x 4 09/19/2015  . History of coronary artery bypass surgery 09/19/2015  . Prolonged Q-T interval on ECG 09/17/2015  . Angina  pectoris ([place])   . Gastroesophageal reflux disease 09/15/2015  . Gout 09/15/2015  . Hyperlipidemia   . Diabetes mellitus with renal complications ([place])   . Obesity (BMI 30.0-34.9) 08/02/2015  . Asthma 04/27/2015  . Vitamin D deficiency 04/27/2015  . Hyperlipidemia associated with type 2 diabetes mellitus ([place]) 04/27/2015  . Essential hypertension 04/24/2015  . ESRD (end stage renal disease) ([place]) 04/24/2015  . Type 2 diabetes mellitus with hyperglycemia ([place]) 04/24/2015  . Metatarsalgia of both feet 07/25/2014  . Equinus deformity of foot, acquired 07/25/2014  . Onychomycosis 07/25/2014  . Pain in lower limb 07/25/2014  . Acquired equinus deformity of foot 07/25/2014  . Metatarsalgia 07/25/2014  . Pain of lower extremity 07/25/2014    Past Surgical History:  Procedure Laterality Date  . ABDOMINAL HYSTERECTOMY    . CARDIAC CATHETERIZATION N/A 09/17/2015   Procedure: Left Heart Cath and Coronary Angiography;  Surgeon: Jettie Booze, MD;  Location: [facility];  Service: Cardiovascular;  Laterality: N/A;  . COLON SURGERY    . CORONARY ARTERY BYPASS GRAFT N/A 09/19/2015   Procedure: CORONARY ARTERY BYPASS GRAFTING (CABG) TIMES FOUR USING BILATARAL SAPHENOUS VEIN GRAFTS AND LEFT INTERNAL MAMMARY ARTERY;  Surgeon: [name], MD;  Location: [facility];  Service: Open Heart Surgery;  Laterality: N/A;  . CORONARY STENT INTERVENTION N/A 01/10/2019   Procedure: CORONARY STENT INTERVENTION;  Surgeon: Leonie Man, MD;  Location: [facility];  Service: Cardiovascular;  Laterality: N/A;  . CORONARY STENT INTERVENTION N/A 07/11/2019   Procedure: CORONARY STENT INTERVENTION;  Surgeon: Lorretta Harp, MD;  Location: [facility];  Service: Cardiovascular;  Laterality: N/A;  . LEFT HEART CATH AND CORONARY ANGIOGRAPHY N/A 07/12/2019   Procedure: LEFT HEART CATH AND CORONARY ANGIOGRAPHY;  Surgeon: Leonie Man, MD;  Location: [facility];  Service: Cardiovascular;   Laterality: N/A;  . LEFT HEART CATH AND CORS/GRAFTS ANGIOGRAPHY N/A 01/10/2019   Procedure: LEFT HEART CATH AND CORS/GRAFTS ANGIOGRAPHY;  Surgeon: Leonie Man, MD;  Location: [facility];  Service: Cardiovascular;  Laterality: N/A;  . LEFT HEART CATH AND CORS/GRAFTS ANGIOGRAPHY N/A 02/11/2019   Procedure: LEFT HEART CATH AND CORS/GRAFTS ANGIOGRAPHY;  Surgeon: Burnell Blanks, MD;  Location: [facility];  Service: Cardiovascular;  Laterality: N/A;  . LEFT HEART CATH AND CORS/GRAFTS ANGIOGRAPHY N/A 07/11/2019   Procedure: LEFT HEART CATH AND CORS/GRAFTS ANGIOGRAPHY;  Surgeon: Lorretta Harp, MD;  Location: [facility];  Service: Cardiovascular;  Laterality: N/A;  . TEE WITHOUT CARDIOVERSION N/A 09/19/2015   Procedure: TRANSESOPHAGEAL ECHOCARDIOGRAM (TEE);  Surgeon: [name], MD;  Location: [facility];  Service: Open Heart Surgery;  Laterality: N/A;     OB History   No obstetric history on file.     Family History  Problem Relation Age of Onset  . Heart attack Father     Social History   Tobacco Use  . Smoking status: Never Smoker  . Smokeless tobacco: Former Systems developer    Types: Snuff  Substance  Use Topics  . Alcohol use: No    Alcohol/week: 0.0 standard drinks  . Drug use: No    Home Medications Prior to Admission medications   Medication Sig Start Date End Date Taking? Authorizing Provider  acetaminophen (TYLENOL) 500 MG tablet Take 1 tablet (500 mg total) by mouth every 6 (six) hours as needed for mild pain or headache. 02/12/19   Eugenie Filler, MD  albuterol (PROVENTIL HFA;VENTOLIN HFA) 108 (90 Base) MCG/ACT inhaler Inhale 2 puffs into the lungs every 6 (six) hours as needed for wheezing or shortness of breath.    [provider]  allopurinol (ZYLOPRIM) 100 MG tablet Take 100 mg by mouth 2 (two) times daily.     [provider]  aspirin EC 81 MG EC tablet Take 1 tablet (81 mg total) by mouth daily. 10/03/15   Barrett, Erin R,  PA-C  atorvastatin (LIPITOR) 80 MG tablet Take 80 mg by mouth daily at 6 PM.  07/12/18   [provider]  brimonidine (ALPHAGAN P) 0.1 % SOLN Place 1 drop into both eyes 2 (two) times daily.    [provider]  Cholecalciferol (VITAMIN D-3) 1000 units CAPS Take 1,000 Units by mouth daily.     [provider]  clopidogrel (PLAVIX) 75 MG tablet Take 1 tablet (75 mg total) by mouth daily. 08/15/19   Dunn, Nedra Hai, PA-C  Cranberry 500 MG CAPS Take 1 capsule by mouth daily.    [provider]  diphenhydrAMINE (BENADRYL) 25 mg capsule Take 25 mg by mouth as needed.    [provider]  ferric citrate (AURYXIA) 1 GM 210 MG(Fe) tablet Take 210 mg by mouth 3 (three) times daily with meals.     [provider]  Insulin Degludec (TRESIBA FLEXTOUCH [place]) Inject 20 Units into the skin daily.    [provider]  isosorbide mononitrate (IMDUR) 60 MG 24 hr tablet Take 1 tablet (60 mg total) by mouth 2 (two) times daily. 07/13/19   Dunn, Nedra Hai, PA-C  latanoprost (XALATAN) 0.005 % ophthalmic solution Place 1 drop into both eyes at bedtime. 07/05/18   [provider]  loratadine (CLARITIN) 10 MG tablet Take 10 mg by mouth as needed for allergies.     [provider]  Melatonin 5 MG CAPS Take 5 mg by mouth at bedtime.     [provider]  multivitamin (RENA-VIT) TABS tablet Take 1 tablet by mouth daily.    [provider]  nitroGLYCERIN (NITROSTAT) 0.4 MG SL tablet Place 1 tablet (0.4 mg total) under the tongue every 5 (five) minutes as needed for chest pain. 03/18/19   Barrett, Evelene Croon, PA-C  pantoprazole (PROTONIX) 40 MG tablet Take 1 tablet (40 mg total) by mouth daily. 08/10/19   Dunn, Nedra Hai, PA-C  Probiotic Product (PROBIOTIC PO) Take 1 tablet by mouth daily. 50 billion cfus    [provider]    Allergies    Beta adrenergic blockers  Review of Systems   Review of Systems  Cardiovascular: Positive for chest  pain.  All other systems reviewed and are negative.   Physical Exam Updated Vital Signs BP (!) 173/81 (BP Location: Right Arm)   Pulse 80   Temp 98.2 F (36.8 C) (Oral)   Resp 20   Ht 5\' 2"  (1.575 m)   Wt 73.5 kg   SpO2 99%   BMI 29.63 kg/m   Physical Exam Vitals and nursing note reviewed.  Constitutional:  General: She is not in acute distress.    Appearance: She is well-developed. She is not diaphoretic.  HENT:     Head: Normocephalic and atraumatic.  Cardiovascular:     Rate and Rhythm: Normal rate and regular rhythm.     Heart sounds: No murmur. No friction rub. No gallop.   Pulmonary:     Effort: Pulmonary effort is normal. No respiratory distress.     Breath sounds: Normal breath sounds. No wheezing.  Abdominal:     General: Bowel sounds are normal. There is no distension.     Palpations: Abdomen is soft.     Tenderness: There is no abdominal tenderness.  Musculoskeletal:        General: Normal range of motion.     Cervical back: Normal range of motion and neck supple.     Comments: There is tenderness to palpation to the lateral aspect of the right hip.  There is no shortening or rotation of the leg.  DP pulses are easily palpable and motor and sensation are intact to the entire leg.  Skin:    General: Skin is warm and dry.  Neurological:     Mental Status: She is alert and oriented to person, place, and time.     ED Results / Procedures / Treatments   Labs (all labs ordered are listed, but only abnormal results are displayed) Labs Reviewed  BASIC METABOLIC PANEL  CBC  TROPONIN I (HIGH SENSITIVITY)    EKG EKG Interpretation  Date/Time:  Wednesday October 12 2019 16:12:44 EDT Ventricular Rate:  82 PR Interval:  182 QRS Duration: 102 QT Interval:  408 QTC Calculation: 476 R Axis:   34 Text Interpretation: Normal sinus rhythm Minimal voltage criteria for LVH, may be normal variant ( Cornell product ) Possible Anterior infarct , age undetermined ST  & T wave abnormality, consider inferior ischemia Abnormal ECG Confirmed by Veryl Speak [id number]) on 10/12/2019 4:42:45 PM   Radiology No results found.  Procedures Procedures (including critical care time)  Medications Ordered in ED Medications  sodium chloride flush (NS) 0.9 % injection 3 mL (has no administration in time range)    ED Course  I have reviewed the triage vital signs and the nursing notes.  Pertinent labs & imaging results that were available during my care of the patient were reviewed by me and considered in my medical decision making (see chart for details).    MDM Rules/Calculators/A&P  Patient with history of coronary artery disease with stents, end-stage renal disease on hemodialysis.  She presents today with complaints of chest pain and hip pain.  The chest pain began at approximately midnight and lasted for approximately 1 hour.  It did seem to improve after she took 3 nitroglycerin.  Her heart enzymes are 300, then repeated at 331.  This was 16 hours after the onset of symptoms and in the setting of hemodialysis.  She has been symptom-free ever since.  I see no indication for admission and feel as though patient is appropriate for discharge.  She is to continue her medications/nitroglycerin as needed.  Patient also with hip pain after a fall.  She has what appears to be an inferior rami fracture.  She will be given pain medicine and is to continue weightbearing as tolerated.  Final Clinical Impression(s) / ED Diagnoses Final diagnoses:  None    Rx / DC Orders ED Discharge Orders    None       Veryl Speak, MD 10/12/19 2021

## 2019-10-12 NOTE — ED Notes (Signed)
IV infiltrated with NS flush. No pain or redness. IV dc'd.

## 2019-10-12 NOTE — ED Notes (Signed)
Notified Dr. Stark Jock of troponin level of 331 via lab.

## 2019-10-12 NOTE — ED Triage Notes (Signed)
Pt c/o CP started last night-also states she fell ~1.5 week and having right hip pain-NAD-to triage in w/c

## 2019-10-12 NOTE — ED Notes (Signed)
Dr. Stark Jock aware of elevated troponin level by Sonia Baller RT

## 2019-10-12 NOTE — ED Notes (Signed)
IV unsuccessful. Rod Holler RN attempting [place] IV.

## 2019-10-12 NOTE — ED Notes (Signed)
Date and time results received: 10/12/19 7:53 PM    Test: trop Critical Value: 331  Name of Provider Notified: Dr Stark Jock notified  Orders Received? Or Actions Taken?:

## 2019-10-12 NOTE — ED Notes (Signed)
ED Provider at bedside. 

## 2019-10-12 NOTE — ED Notes (Signed)
Date and time results received: 10/12/19 1829  Test: Trop Critical Value: 300  Name of Provider Notified: Dr. Stark Jock  No orders received at this time

## 2019-10-13 ENCOUNTER — Telehealth: Payer: Self-pay

## 2019-10-13 DIAGNOSIS — E785 Hyperlipidemia, unspecified: Secondary | ICD-10-CM | POA: Diagnosis not present

## 2019-10-13 DIAGNOSIS — U071 COVID-19: Secondary | ICD-10-CM | POA: Diagnosis not present

## 2019-10-13 DIAGNOSIS — I5042 Chronic combined systolic (congestive) and diastolic (congestive) heart failure: Secondary | ICD-10-CM | POA: Diagnosis not present

## 2019-10-13 DIAGNOSIS — D631 Anemia in chronic kidney disease: Secondary | ICD-10-CM | POA: Diagnosis not present

## 2019-10-13 DIAGNOSIS — E1122 Type 2 diabetes mellitus with diabetic chronic kidney disease: Secondary | ICD-10-CM | POA: Diagnosis not present

## 2019-10-13 DIAGNOSIS — N186 End stage renal disease: Secondary | ICD-10-CM | POA: Diagnosis not present

## 2019-10-13 DIAGNOSIS — E1151 Type 2 diabetes mellitus with diabetic peripheral angiopathy without gangrene: Secondary | ICD-10-CM | POA: Diagnosis not present

## 2019-10-13 DIAGNOSIS — I132 Hypertensive heart and chronic kidney disease with heart failure and with stage 5 chronic kidney disease, or end stage renal disease: Secondary | ICD-10-CM | POA: Diagnosis not present

## 2019-10-13 DIAGNOSIS — Z23 Encounter for immunization: Secondary | ICD-10-CM | POA: Diagnosis not present

## 2019-10-13 DIAGNOSIS — E119 Type 2 diabetes mellitus without complications: Secondary | ICD-10-CM | POA: Diagnosis not present

## 2019-10-13 DIAGNOSIS — M109 Gout, unspecified: Secondary | ICD-10-CM | POA: Diagnosis not present

## 2019-10-13 DIAGNOSIS — I2510 Atherosclerotic heart disease of native coronary artery without angina pectoris: Secondary | ICD-10-CM | POA: Diagnosis not present

## 2019-10-13 DIAGNOSIS — I252 Old myocardial infarction: Secondary | ICD-10-CM | POA: Diagnosis not present

## 2019-10-13 DIAGNOSIS — N2581 Secondary hyperparathyroidism of renal origin: Secondary | ICD-10-CM | POA: Diagnosis not present

## 2019-10-13 NOTE — Telephone Encounter (Signed)
I spoke to the patient's daughter who called because the patient has had CP over the past few days and went to Urgent Care on 4/7 and was told to f/u with cardiologist.  I scheduled an appointment on 4/12 with Mickel Baas for further evaluation.

## 2019-10-15 DIAGNOSIS — N2581 Secondary hyperparathyroidism of renal origin: Secondary | ICD-10-CM | POA: Diagnosis not present

## 2019-10-15 DIAGNOSIS — N186 End stage renal disease: Secondary | ICD-10-CM | POA: Diagnosis not present

## 2019-10-15 DIAGNOSIS — E785 Hyperlipidemia, unspecified: Secondary | ICD-10-CM | POA: Diagnosis not present

## 2019-10-15 DIAGNOSIS — Z23 Encounter for immunization: Secondary | ICD-10-CM | POA: Diagnosis not present

## 2019-10-15 DIAGNOSIS — D631 Anemia in chronic kidney disease: Secondary | ICD-10-CM | POA: Diagnosis not present

## 2019-10-17 ENCOUNTER — Ambulatory Visit: Payer: Medicare HMO | Admitting: Cardiology

## 2019-10-17 ENCOUNTER — Encounter: Payer: Self-pay | Admitting: Cardiology

## 2019-10-17 ENCOUNTER — Other Ambulatory Visit: Payer: Self-pay

## 2019-10-17 VITALS — BP 152/78 | HR 76 | Ht 62.0 in | Wt 169.0 lb

## 2019-10-17 DIAGNOSIS — I340 Nonrheumatic mitral (valve) insufficiency: Secondary | ICD-10-CM

## 2019-10-17 DIAGNOSIS — I5043 Acute on chronic combined systolic (congestive) and diastolic (congestive) heart failure: Secondary | ICD-10-CM | POA: Diagnosis not present

## 2019-10-17 DIAGNOSIS — J45909 Unspecified asthma, uncomplicated: Secondary | ICD-10-CM | POA: Diagnosis not present

## 2019-10-17 DIAGNOSIS — N186 End stage renal disease: Secondary | ICD-10-CM

## 2019-10-17 DIAGNOSIS — I5042 Chronic combined systolic (congestive) and diastolic (congestive) heart failure: Secondary | ICD-10-CM | POA: Diagnosis not present

## 2019-10-17 DIAGNOSIS — I25810 Atherosclerosis of coronary artery bypass graft(s) without angina pectoris: Secondary | ICD-10-CM

## 2019-10-17 DIAGNOSIS — I2510 Atherosclerotic heart disease of native coronary artery without angina pectoris: Secondary | ICD-10-CM

## 2019-10-17 DIAGNOSIS — R001 Bradycardia, unspecified: Secondary | ICD-10-CM

## 2019-10-17 DIAGNOSIS — E785 Hyperlipidemia, unspecified: Secondary | ICD-10-CM | POA: Diagnosis not present

## 2019-10-17 MED ORDER — PANTOPRAZOLE SODIUM 40 MG PO TBEC: 40.0000 mg | DELAYED_RELEASE_TABLET | Freq: Two times a day (BID) | ORAL | 3 refills | Status: AC

## 2019-10-17 NOTE — Patient Instructions (Addendum)
Medication Instructions:  Your physician has recommended you make the following change in your medication:  1-Increase Protonix 40 mg by mouth twice daily. Call our office if this does not help within 2 weeks.  *If you need a refill on your cardiac medications before your next appointment, please call your pharmacy*  Lab Work: If you have labs (blood work) drawn today and your tests are completely normal, you will receive your results only by: Marland Kitchen MyChart Message (if you have MyChart) OR . A paper copy in the mail If you have any lab test that is abnormal or we need to change your treatment, we will call you to review the results.  Testing/Procedures: None ordered today.   Follow-Up: At [facility], you and your health needs are our priority.  As part of our continuing mission to provide you with exceptional heart care, we have created designated Provider Care Teams.  These Care Teams include your primary Cardiologist (physician) and Advanced Practice Providers (APPs -  Physician Assistants and Nurse Practitioners) who all work together to provide you with the care you need, when you need it.  We recommend signing up for the patient portal called "MyChart".  Sign up information is provided on this After Visit Summary.  MyChart is used to connect with patients for Virtual Visits (Telemedicine).  Patients are able to view lab/test results, encounter notes, upcoming appointments, etc.  Non-urgent messages can be sent to your provider as well.   To learn more about what you can do with MyChart, go to [email or web address].    Your next appointment:   1 month(s)  The format for your next appointment:   In Person  Provider:   You may see Ena Dawley, MDor one of the following Advanced Practice Providers on your designated Care Team:    Melina Copa, PA-C  Cecilie Kicks, PA-C

## 2019-10-17 NOTE — Progress Notes (Signed)
Cardiology Office Note   Date:  10/17/2019   ID:  Monica [name], DOB 1930/08/07, MRN [id number]  PCP:  Monica Mccreedy, MD  Cardiologist:  Dr. Meda [name]     Chief Complaint  Patient presents with  . Hospitalization Follow-up      History of Present Illness: Monica [name] is a 86 y.o. female who presents for follow up for CAD and recent ER visit.  She has a history of CAD (s/p CABG in 2017, NSTEMI 01/2019 s/p PCI, NSTEMI 07/2019 s/p PCI ), chronic combined CHF, moderate mitral regurgitation, anemia, bradycardia/sinus node dysfunction, ESRD on HD (TTS), GERD, HLD and DM who presents for follow-up.  She has had a tumultous course in the last year. She was admitted July 2020 forNSTEMI. Cath 01/07/2019 cath showed occluded/atretic SVG to the distal RCA, severe proximal to stent and in-stent restenosis of SVG to OM with successful PTCA/with overlapping DES stent and successful DES to the ostial proximal SVG to the RI.Echo showed new reduced LVEF 30 to 35% with moderately dilated right and left atrium moderate MR and mild AI. She was placed on ASA, Plavix and metoprolol. She was readmitted in mid-July with fatigue and dizziness with junctional bradycardia HR in the 40s, therefore metoprolol was discontinued. She was then again readmitted 02/10/19 with chest pain and elevated troponin. Nuclear stress test was abnormal, prompting repeat cath with stable disease and no targets for PCI. Medical therapy was recommended. She has been readmitted several times since then for volume overload or chest pain. She tested positive for Covid 05/31/19 as well as on admission 06/21/19. During November 2020 admission, plan was for possible amyloid work-up but both the daughter and the patient were requesting discharge after she was feeling better. During her December 2020 admission with chest pain, per discussion with patient and daughter, medical therapy was continued, reserving cath for recurrent symptoms. There was a period  of time she was also reported to be refusing medications and thought to be incompetent per daughter. Palliative care concept was introduced last year but not well received per notes.   She was readmitted 07/2019 with chest pain waking her out of sleep. Initial electrocardiogram showed marked ST depression in the inferolateral leads. F/u ECGshowed junctional rhythm, left axis deviation, improvement in ST depression. Her carvedilol was held due to junctional rhythm. hsTroponin peaked at 3107. She underwent cath with PCI as below with successful restenting of the SVG-ramus branch in-stent restenosis with DES. Her LVEDP was elevated. The day after cath she developed recurrent chest pain, SOB, and onset of bradycardia in the low-mid 40s. She received SL NTG with only transient relief. She was taken back to the cath lab as above without culprit lesion for recurrence of anginal pain, suspect possible residual downstream occlusion from initial PCI on 07/11/2019. She had normal LVEDP. Her carvedilol was definitively discontinued. Last labs otherwise personally reviewed from 07/2019 include LDL of 66, K 4.7, Cr 4.88, albumin 3.2, Hgb 13.3, normal AST/ALT.  Last visit 08/10/19  She was doing well. She's been overall feeling good. She did have 3 episodes since last visit of chest discomfort that felt like indigestion, provoked solely by eating. This was not like prior angina, reminded her of reflux. She took a SL NTG each time and it resolved after 15-20 minutes. She has been increasing her activity a little bit around the house and does not have any exertional angina. Her breathing has been stable. No edema, orthopnea, palpitations, bleeding. Initial BP slightly elevated at 152/78,  recheck by me was 127/62. She does report her BP occasionally drops at HD but is not sure of the values.  4/7/seen in ER with chest pain and hip pain.  3 SL NTG relieved sharp chest pain.  Mild SOB did not feel like prior cardiac issues. She had  fallen 10 days prior.  Hs Troponin 300 & 331, CBC was normal and BMP with K+ 4.7.  BP there with hip pain was 173/81.  Today she is with her son.  Her daughter is on phone on speaker.  She feels well, she walked in with cane, when I have seen she is in wheel chair.  She feels like she is doing well at dialysis.  She still with have episodic chest pain relieved with SL NTG usually after meals.  This may be all GI   She does not know who did her last colonoscopy.     Past Medical History:  Diagnosis Date  . Anemia   . Asthma   . Chronic combined systolic and diastolic CHF (congestive heart failure) ([place])    a. Echo 11/19 [facility]):  EF 55-65, normal wall motion, normal diastolic function. b. Echo 01/2019 EF 30-35%, moderate MR, mild AI.  Marland Kitchen Coronary artery disease    a. s/p CABG 2017. b.s/p NSTEMI 11/19 [facility]) >> S-PDA 100 >>PCI: DES to S-OM. c. NSTEMI 01/2019 - occluded SVG-distal RCA, severe proximal to stent/ISR of SVG-OM s/p overlapping DES, DES to ostial prox SVG to the RI. d. NSTEMI 07/2019 s/p DES to ramus ISR.  Marland Kitchen COVID-19 virus infection 05/2019  . ESRD (end stage renal disease)    Dialysis Tu, Th, Sa  . Essential hypertension   . Gastroesophageal reflux disease   . History of blood transfusion   . Hyperlipidemia   . Junctional bradycardia   . Moderate mitral regurgitation   . Peptic ulcer   . Protein calorie malnutrition ([place])   . Sinus node dysfunction ([facility])   . Type 2 diabetes mellitus ([place])     Past Surgical History:  Procedure Laterality Date  . ABDOMINAL HYSTERECTOMY    . CARDIAC CATHETERIZATION N/A 09/17/2015   Procedure: Left Heart Cath and Coronary Angiography;  Surgeon: Jettie Booze, MD;  Location: [place] [facility];  Service: Cardiovascular;  Laterality: N/A;  . COLON SURGERY    . CORONARY ARTERY BYPASS GRAFT N/A 09/19/2015   Procedure: CORONARY ARTERY BYPASS GRAFTING (CABG) TIMES FOUR USING BILATARAL SAPHENOUS VEIN GRAFTS AND LEFT INTERNAL  MAMMARY ARTERY;  Surgeon: [name], MD;  Location: [place];  Service: Open Heart Surgery;  Laterality: N/A;  . CORONARY STENT INTERVENTION N/A 01/10/2019   Procedure: CORONARY STENT INTERVENTION;  Surgeon: Leonie Man, MD;  Location: [place] [facility];  Service: Cardiovascular;  Laterality: N/A;  . CORONARY STENT INTERVENTION N/A 07/11/2019   Procedure: CORONARY STENT INTERVENTION;  Surgeon: Lorretta Harp, MD;  Location: [place] [facility];  Service: Cardiovascular;  Laterality: N/A;  . LEFT HEART CATH AND CORONARY ANGIOGRAPHY N/A 07/12/2019   Procedure: LEFT HEART CATH AND CORONARY ANGIOGRAPHY;  Surgeon: Leonie Man, MD;  Location: [place] [facility];  Service: Cardiovascular;  Laterality: N/A;  . LEFT HEART CATH AND CORS/GRAFTS ANGIOGRAPHY N/A 01/10/2019   Procedure: LEFT HEART CATH AND CORS/GRAFTS ANGIOGRAPHY;  Surgeon: Leonie Man, MD;  Location: [place] [facility];  Service: Cardiovascular;  Laterality: N/A;  . LEFT HEART CATH AND CORS/GRAFTS ANGIOGRAPHY N/A 02/11/2019   Procedure: LEFT HEART CATH  AND CORS/GRAFTS ANGIOGRAPHY;  Surgeon: Burnell Blanks, MD;  Location: [place] [facility];  Service: Cardiovascular;  Laterality: N/A;  . LEFT HEART CATH AND CORS/GRAFTS ANGIOGRAPHY N/A 07/11/2019   Procedure: LEFT HEART CATH AND CORS/GRAFTS ANGIOGRAPHY;  Surgeon: Lorretta Harp, MD;  Location: [place] [facility];  Service: Cardiovascular;  Laterality: N/A;  . TEE WITHOUT CARDIOVERSION N/A 09/19/2015   Procedure: TRANSESOPHAGEAL ECHOCARDIOGRAM (TEE);  Surgeon: [name], MD;  Location: [place];  Service: Open Heart Surgery;  Laterality: N/A;     Current Outpatient Medications  Medication Sig Dispense Refill  . acetaminophen (TYLENOL) 500 MG tablet Take 1 tablet (500 mg total) by mouth every 6 (six) hours as needed for mild pain or headache. 30 tablet 0  . albuterol (PROVENTIL HFA;VENTOLIN HFA) 108 (90 Base) MCG/ACT inhaler Inhale 2 puffs into the lungs every 6 (six)  hours as needed for wheezing or shortness of breath.    . allopurinol (ZYLOPRIM) 100 MG tablet Take 100 mg by mouth 2 (two) times daily.     Marland Kitchen aspirin EC 81 MG EC tablet Take 1 tablet (81 mg total) by mouth daily.    Marland Kitchen atorvastatin (LIPITOR) 80 MG tablet Take 80 mg by mouth daily at 6 PM.     . brimonidine (ALPHAGAN P) 0.1 % SOLN Place 1 drop into both eyes 2 (two) times daily.    . Cholecalciferol (VITAMIN D-3) 1000 units CAPS Take 1,000 Units by mouth daily.     . clopidogrel (PLAVIX) 75 MG tablet Take 1 tablet (75 mg total) by mouth daily. 90 tablet 3  . Cranberry 500 MG CAPS Take 1 capsule by mouth daily.    . diphenhydrAMINE (BENADRYL) 25 mg capsule Take 25 mg by mouth as needed.    . ferric citrate (AURYXIA) 1 GM 210 MG(Fe) tablet Take 210 mg by mouth 3 (three) times daily with meals.     . Insulin Degludec (TRESIBA FLEXTOUCH [place]) Inject 20 Units into the skin daily.    . isosorbide mononitrate (IMDUR) 60 MG 24 hr tablet Take 1 tablet (60 mg total) by mouth 2 (two) times daily. 60 tablet 6  . latanoprost (XALATAN) 0.005 % ophthalmic solution Place 1 drop into both eyes at bedtime.    Marland Kitchen loratadine (CLARITIN) 10 MG tablet Take 10 mg by mouth as needed for allergies.     . Melatonin 5 MG CAPS Take 5 mg by mouth at bedtime.     . multivitamin (RENA-VIT) TABS tablet Take 1 tablet by mouth daily.    . nitroGLYCERIN (NITROSTAT) 0.4 MG SL tablet Place 1 tablet (0.4 mg total) under the tongue every 5 (five) minutes as needed for chest pain. 25 tablet 12  . pantoprazole (PROTONIX) 40 MG tablet Take 1 tablet (40 mg total) by mouth 2 (two) times daily. 180 tablet 3  . Probiotic Product (PROBIOTIC PO) Take 1 tablet by mouth daily. 50 billion cfus     No current facility-administered medications for this visit.    Allergies:   Beta adrenergic blockers    Social History:  The patient  reports that she has never smoked. She has quit using smokeless tobacco.  Her smokeless tobacco use included snuff.  She reports that she does not drink alcohol or use drugs.   Family History:  The patient's family history includes Heart attack in her father.    ROS:  General:no colds or fevers, slow weight increase Skin:no rashes or ulcers HEENT:no blurred vision, no congestion CV:see  HPI PUL:see HPI GI:no diarrhea constipation or melena, no indigestion GU:no hematuria, no dysuria MS:no joint pain, no claudication Neuro:no syncope, no lightheadedness Endo:no diabetes, no thyroid disease  Wt Readings from Last 3 Encounters:  10/17/19 169 lb (76.7 kg)  10/12/19 162 lb (73.5 kg)  08/10/19 158 lb 12 oz (72 kg)     PHYSICAL EXAM: VS:  BP (!) 152/78   Pulse 76   Ht 5\' 2"  (1.575 m)   Wt 169 lb (76.7 kg)   BMI 30.91 kg/m  , BMI Body mass index is 30.91 kg/m. General:Pleasant affect, NAD Skin:Warm and dry, brisk capillary refill HEENT:normocephalic, sclera clear, mucus membranes moist Neck:supple, no JVD, no bruits  Heart:S1S2 RRR without murmur, gallup, rub or click Lungs:clear without rales, rhonchi, or wheezes IRC:VELF, non tender, + BS, do not palpate liver spleen or masses Ext:tr lower ext edema, 2+ pedal pulses, 2+ radial pulses Neuro:alert and oriented X 3, MAE, follows commands, + facial symmetry    EKG:  EKG is NOT ordered today. The ekg 10/12/19 in med center [facility] ER demonstrated SR and no acute changes.    Recent Labs: 05/31/2019: B Natriuretic Peptide 3,301.8 06/21/2019: TSH 4.803 07/11/2019: ALT 27 10/12/2019: BUN 40; Creatinine, Ser 6.06; Hemoglobin 12.1; Platelets 269; Potassium 4.7; Sodium 135    Lipid Panel    Component Value Date/Time   CHOL 134 07/12/2019 0845   CHOL 132 04/29/2019 0940   TRIG 78 07/12/2019 0845   HDL 52 07/12/2019 0845   HDL 48 04/29/2019 0940   CHOLHDL 2.6 07/12/2019 0845   VLDL 16 07/12/2019 0845   LDLCALC 66 07/12/2019 0845   LDLCALC 68 04/29/2019 0940       Other studies Reviewed: Additional studies/ records that were reviewed today  include: .  Cardiac cath 07/11/19  Mid RCA lesion is 100% stenosed.  Origin to Prox Graft lesion is 100% stenosed.  Ost LM to Mid LM lesion is 60% stenosed.  Ost Cx to Prox Cx lesion is 100% stenosed.  Ost LAD to Prox LAD lesion is 99% stenosed.  Prox LAD to Mid LAD lesion is 100% stenosed.  Dist Graft to Insertion lesion is 99% stenosed.  Previously placed Origin to Prox Graft stent (unknown type) is widely patent.  A drug-eluting stent was successfully placed.  Post intervention, there is a 0% residual stenosis.   high-grade in-stent restenosis within the distal ramus intermedius branch SVG stent.  The LIMA to the LAD was patent.  The vein to the obtuse marginal branch was patent as well (proximal stented segment widely patent).  The native right and RCA SVG were known to be occluded.  Her LVEDP was significantly elevated at 30.  She was dialyzed yesterday.  Her serum potassium was 6.2 by i-STAT.  She had successful restenting of the ramus branch in-stent restenosis with a synergy drug-eluting stent with excellent result  Cardiac cath 07/12/19 done for recurrent chest pain.  Post dialysis.  HR was low as well.     ------ NATIVE CORONARIES -------  Dist LM to Prox LAD lesion is 30% stenosed.  Mid LAD-1 lesion is 95% stenosed. Mid LAD-2 lesion is 100% stenosed.  Ost Cx to Mid Cx lesion is 100% stenosed. Ramus lesion is 99% stenosed.  Prox RCA lesion is 70% stenosed. Prox RCA to Mid RCA lesion is 90% stenosed. Mid RCA lesion is 95% stenosed. Dist RCA lesion is 100% stenosed with 100% stenosed side branch in RPAV.  Some of the distal RCA branches are filled  via left to right collaterals  ------ GRAFTS ------  LIMA graft was not injected as it was widely patent on January 4  SVG-dRCA graft was not visualized -- known occlusion.  SVG-OM1 is large. Previously placed Mid Graft to Insertion stent (2 previous overlapping drug-eluting stents covered with a third drug-eluting stent)  is widely patent.  SVG-RI is large. Previously placed Origin to Prox Graft stent (drug-eluting stent) is widely patent. (There is mild competitive flow)  ------------  LV end diastolic pressure is normal.  Mid Graft to Dist Graft lesion is 100% stenosed.   SUMMARY  Widely patent SVG-OM stent placed on 07/11/2019 with no residual stenosis.  Also widely patent SVG-RI with previous stent placed in July 2020  Stable occlusive native CAD with known occlusion of native RCA, LAD and circumflex.  Severe disease of the proximal RI.  Known patent LIMA-LAD and occluded SVG-RCA.  Normal LVEDP  No culprit lesion for recurrence of anginal pain.  Suspect possible residual downstream occlusion from initial PCI on 07/11/2019   RECOMMENDATIONS  Continue current medications, anticipate possible discharge tomorrow if stable.  Continue medical management of existing CAD.     ASSESSMENT AND PLAN:  1.  CAD with CABG 2017, NSTEMI PCI 01/2019 and freq episodes with chest pain, HTN, Hypotension.  meds adjusted. 07/11/19 NSTEMI and stent to VG to Ramus for instent restenosis.  Last cath for recurrent pain after PCI was patent stents. Felt to be debris distal Stent post cath.   Continue ASA and plavix  Follow up in 4 weeks. Reviewed ER notes and labs.   2.  Junct rhythm, several times on BB. Last time in Jan 2021. No more rate slowing meds. Discussed with pt and family.  If needed for tachycardia then PPM would need to be considered.   3.  Chest pain, sounds GI, post prandial, will increase protonix to BID for 2 weeks. If pain increases despite this or severe pain she can call and/or go to ER.  Other thought would be increase imdur.  4.  Chronic combined CHF - euvolemic today - managed by HD .    5.  MR moderate, follow   6.  HLD on statin continue  7.  ESRD on HD per nephrology      Current medicines are reviewed with the patient today.  The patient Has no concerns regarding medicines.  The  following changes have been made:  See above Labs/ tests ordered today include:see above  Disposition:   FU:  see above  Signed, Cecilie Kicks, NP  10/17/2019 4:35 PM    [facility] [place], [place], [place], [place] Phone: [phone]; Fax: [phone]

## 2019-10-18 DIAGNOSIS — N2581 Secondary hyperparathyroidism of renal origin: Secondary | ICD-10-CM | POA: Diagnosis not present

## 2019-10-18 DIAGNOSIS — E785 Hyperlipidemia, unspecified: Secondary | ICD-10-CM | POA: Diagnosis not present

## 2019-10-18 DIAGNOSIS — N186 End stage renal disease: Secondary | ICD-10-CM | POA: Diagnosis not present

## 2019-10-18 DIAGNOSIS — Z23 Encounter for immunization: Secondary | ICD-10-CM | POA: Diagnosis not present

## 2019-10-18 DIAGNOSIS — D631 Anemia in chronic kidney disease: Secondary | ICD-10-CM | POA: Diagnosis not present

## 2019-10-19 ENCOUNTER — Other Ambulatory Visit: Payer: Self-pay

## 2019-10-19 DIAGNOSIS — I132 Hypertensive heart and chronic kidney disease with heart failure and with stage 5 chronic kidney disease, or end stage renal disease: Secondary | ICD-10-CM | POA: Diagnosis not present

## 2019-10-19 DIAGNOSIS — E1122 Type 2 diabetes mellitus with diabetic chronic kidney disease: Secondary | ICD-10-CM | POA: Diagnosis not present

## 2019-10-19 DIAGNOSIS — N186 End stage renal disease: Secondary | ICD-10-CM | POA: Diagnosis not present

## 2019-10-19 DIAGNOSIS — I5042 Chronic combined systolic (congestive) and diastolic (congestive) heart failure: Secondary | ICD-10-CM | POA: Diagnosis not present

## 2019-10-19 DIAGNOSIS — I252 Old myocardial infarction: Secondary | ICD-10-CM | POA: Diagnosis not present

## 2019-10-19 DIAGNOSIS — I2510 Atherosclerotic heart disease of native coronary artery without angina pectoris: Secondary | ICD-10-CM | POA: Diagnosis not present

## 2019-10-19 DIAGNOSIS — E1151 Type 2 diabetes mellitus with diabetic peripheral angiopathy without gangrene: Secondary | ICD-10-CM | POA: Diagnosis not present

## 2019-10-19 DIAGNOSIS — D631 Anemia in chronic kidney disease: Secondary | ICD-10-CM | POA: Diagnosis not present

## 2019-10-19 DIAGNOSIS — U071 COVID-19: Secondary | ICD-10-CM | POA: Diagnosis not present

## 2019-10-19 NOTE — Patient Outreach (Signed)
[name]) Care Management  10/19/2019  Monica [name] 08/09/1930 [id number]   Telephone Assessment    Outreach attempt # 1 to patient. No answer after several rings.      Plan: RN CM will make outreach attempt to patient within 3-4 business days.    Enzo Montgomery, RN,BSN,CCM [place] Management Telephonic Care Management Coordinator Direct Phone: [phone] Toll Free: [phone] Fax: [phone]

## 2019-10-20 DIAGNOSIS — N2581 Secondary hyperparathyroidism of renal origin: Secondary | ICD-10-CM | POA: Diagnosis not present

## 2019-10-20 DIAGNOSIS — Z23 Encounter for immunization: Secondary | ICD-10-CM | POA: Diagnosis not present

## 2019-10-20 DIAGNOSIS — D631 Anemia in chronic kidney disease: Secondary | ICD-10-CM | POA: Diagnosis not present

## 2019-10-20 DIAGNOSIS — E785 Hyperlipidemia, unspecified: Secondary | ICD-10-CM | POA: Diagnosis not present

## 2019-10-20 DIAGNOSIS — N186 End stage renal disease: Secondary | ICD-10-CM | POA: Diagnosis not present

## 2019-10-21 ENCOUNTER — Other Ambulatory Visit: Payer: Self-pay

## 2019-10-21 NOTE — Patient Outreach (Signed)
[facility]) Care Management  10/21/2019  Monica [name] 1931/02/05 [id number]   Telephone Assessment    Outreach attempt #2 to patient. Spoke with patient. She reports she is doing fairly well. She shares that she had an episode of chest pain last week. She followed her action plan and took Nitro tabs. However, family thought it was best to take her to urgent care to be evaluated. Patient reports MD told her that her "caridac enzymes were slightly elevated" but otherwise she was fine. patient also states that she sustained a fall last week in the kitchen. She was evaluated for the fall as her hip started bothering her. She reports pain is controlled/amanegd at present. She continues to get HHPT. Patient states that she goes to dialysis on tomorrow. She denies any RN CM needs or concerns at present.   Plan: RN CM discussed with patient next outreach within the month of May . Patient gave verbal consent and in agreement with RN CM follow up and timeframe. Patient aware that they may contact RN CM sooner for any issues or concerns.   Enzo Montgomery, RN,BSN,CCM [place] Management Telephonic Care Management Coordinator Direct Phone: [phone] Toll Free: [phone] Fax: [phone]

## 2019-10-22 DIAGNOSIS — E785 Hyperlipidemia, unspecified: Secondary | ICD-10-CM | POA: Diagnosis not present

## 2019-10-22 DIAGNOSIS — D631 Anemia in chronic kidney disease: Secondary | ICD-10-CM | POA: Diagnosis not present

## 2019-10-22 DIAGNOSIS — Z23 Encounter for immunization: Secondary | ICD-10-CM | POA: Diagnosis not present

## 2019-10-22 DIAGNOSIS — N186 End stage renal disease: Secondary | ICD-10-CM | POA: Diagnosis not present

## 2019-10-22 DIAGNOSIS — N2581 Secondary hyperparathyroidism of renal origin: Secondary | ICD-10-CM | POA: Diagnosis not present

## 2019-10-24 DIAGNOSIS — M1A49X0 Other secondary chronic gout, multiple sites, without tophus (tophi): Secondary | ICD-10-CM | POA: Diagnosis not present

## 2019-10-24 DIAGNOSIS — E1151 Type 2 diabetes mellitus with diabetic peripheral angiopathy without gangrene: Secondary | ICD-10-CM | POA: Diagnosis not present

## 2019-10-24 DIAGNOSIS — M160 Bilateral primary osteoarthritis of hip: Secondary | ICD-10-CM | POA: Diagnosis not present

## 2019-10-24 DIAGNOSIS — E782 Mixed hyperlipidemia: Secondary | ICD-10-CM | POA: Diagnosis not present

## 2019-10-24 DIAGNOSIS — J4540 Moderate persistent asthma, uncomplicated: Secondary | ICD-10-CM | POA: Diagnosis not present

## 2019-10-24 DIAGNOSIS — I2510 Atherosclerotic heart disease of native coronary artery without angina pectoris: Secondary | ICD-10-CM | POA: Diagnosis not present

## 2019-10-24 DIAGNOSIS — I119 Hypertensive heart disease without heart failure: Secondary | ICD-10-CM | POA: Diagnosis not present

## 2019-10-25 DIAGNOSIS — E785 Hyperlipidemia, unspecified: Secondary | ICD-10-CM | POA: Diagnosis not present

## 2019-10-25 DIAGNOSIS — N186 End stage renal disease: Secondary | ICD-10-CM | POA: Diagnosis not present

## 2019-10-25 DIAGNOSIS — N2581 Secondary hyperparathyroidism of renal origin: Secondary | ICD-10-CM | POA: Diagnosis not present

## 2019-10-25 DIAGNOSIS — D631 Anemia in chronic kidney disease: Secondary | ICD-10-CM | POA: Diagnosis not present

## 2019-10-25 DIAGNOSIS — Z23 Encounter for immunization: Secondary | ICD-10-CM | POA: Diagnosis not present

## 2019-10-26 DIAGNOSIS — U071 COVID-19: Secondary | ICD-10-CM | POA: Diagnosis not present

## 2019-10-26 DIAGNOSIS — E1122 Type 2 diabetes mellitus with diabetic chronic kidney disease: Secondary | ICD-10-CM | POA: Diagnosis not present

## 2019-10-26 DIAGNOSIS — N186 End stage renal disease: Secondary | ICD-10-CM | POA: Diagnosis not present

## 2019-10-26 DIAGNOSIS — I252 Old myocardial infarction: Secondary | ICD-10-CM | POA: Diagnosis not present

## 2019-10-26 DIAGNOSIS — E1151 Type 2 diabetes mellitus with diabetic peripheral angiopathy without gangrene: Secondary | ICD-10-CM | POA: Diagnosis not present

## 2019-10-26 DIAGNOSIS — I132 Hypertensive heart and chronic kidney disease with heart failure and with stage 5 chronic kidney disease, or end stage renal disease: Secondary | ICD-10-CM | POA: Diagnosis not present

## 2019-10-26 DIAGNOSIS — D631 Anemia in chronic kidney disease: Secondary | ICD-10-CM | POA: Diagnosis not present

## 2019-10-26 DIAGNOSIS — I5042 Chronic combined systolic (congestive) and diastolic (congestive) heart failure: Secondary | ICD-10-CM | POA: Diagnosis not present

## 2019-10-26 DIAGNOSIS — I2510 Atherosclerotic heart disease of native coronary artery without angina pectoris: Secondary | ICD-10-CM | POA: Diagnosis not present

## 2019-10-27 DIAGNOSIS — N2581 Secondary hyperparathyroidism of renal origin: Secondary | ICD-10-CM | POA: Diagnosis not present

## 2019-10-27 DIAGNOSIS — Z23 Encounter for immunization: Secondary | ICD-10-CM | POA: Diagnosis not present

## 2019-10-27 DIAGNOSIS — N186 End stage renal disease: Secondary | ICD-10-CM | POA: Diagnosis not present

## 2019-10-27 DIAGNOSIS — E785 Hyperlipidemia, unspecified: Secondary | ICD-10-CM | POA: Diagnosis not present

## 2019-10-27 DIAGNOSIS — D631 Anemia in chronic kidney disease: Secondary | ICD-10-CM | POA: Diagnosis not present

## 2019-10-29 DIAGNOSIS — Z23 Encounter for immunization: Secondary | ICD-10-CM | POA: Diagnosis not present

## 2019-10-29 DIAGNOSIS — N186 End stage renal disease: Secondary | ICD-10-CM | POA: Diagnosis not present

## 2019-10-29 DIAGNOSIS — E785 Hyperlipidemia, unspecified: Secondary | ICD-10-CM | POA: Diagnosis not present

## 2019-10-29 DIAGNOSIS — D631 Anemia in chronic kidney disease: Secondary | ICD-10-CM | POA: Diagnosis not present

## 2019-10-29 DIAGNOSIS — N2581 Secondary hyperparathyroidism of renal origin: Secondary | ICD-10-CM | POA: Diagnosis not present

## 2019-11-01 DIAGNOSIS — N186 End stage renal disease: Secondary | ICD-10-CM | POA: Diagnosis not present

## 2019-11-01 DIAGNOSIS — E785 Hyperlipidemia, unspecified: Secondary | ICD-10-CM | POA: Diagnosis not present

## 2019-11-01 DIAGNOSIS — N2581 Secondary hyperparathyroidism of renal origin: Secondary | ICD-10-CM | POA: Diagnosis not present

## 2019-11-01 DIAGNOSIS — D631 Anemia in chronic kidney disease: Secondary | ICD-10-CM | POA: Diagnosis not present

## 2019-11-01 DIAGNOSIS — Z23 Encounter for immunization: Secondary | ICD-10-CM | POA: Diagnosis not present

## 2019-11-02 ENCOUNTER — Telehealth: Payer: Self-pay | Admitting: Cardiology

## 2019-11-02 DIAGNOSIS — I252 Old myocardial infarction: Secondary | ICD-10-CM | POA: Diagnosis not present

## 2019-11-02 DIAGNOSIS — I132 Hypertensive heart and chronic kidney disease with heart failure and with stage 5 chronic kidney disease, or end stage renal disease: Secondary | ICD-10-CM | POA: Diagnosis not present

## 2019-11-02 DIAGNOSIS — D631 Anemia in chronic kidney disease: Secondary | ICD-10-CM | POA: Diagnosis not present

## 2019-11-02 DIAGNOSIS — E1151 Type 2 diabetes mellitus with diabetic peripheral angiopathy without gangrene: Secondary | ICD-10-CM | POA: Diagnosis not present

## 2019-11-02 DIAGNOSIS — E1122 Type 2 diabetes mellitus with diabetic chronic kidney disease: Secondary | ICD-10-CM | POA: Diagnosis not present

## 2019-11-02 DIAGNOSIS — I2510 Atherosclerotic heart disease of native coronary artery without angina pectoris: Secondary | ICD-10-CM | POA: Diagnosis not present

## 2019-11-02 DIAGNOSIS — N186 End stage renal disease: Secondary | ICD-10-CM | POA: Diagnosis not present

## 2019-11-02 DIAGNOSIS — U071 COVID-19: Secondary | ICD-10-CM | POA: Diagnosis not present

## 2019-11-02 DIAGNOSIS — I5042 Chronic combined systolic (congestive) and diastolic (congestive) heart failure: Secondary | ICD-10-CM | POA: Diagnosis not present

## 2019-11-02 NOTE — Telephone Encounter (Signed)
Dorothea Ogle, Physical Therapist with [facility] is calling to get verbal orders to continue physical therapy. He states that it will be 1x a week for 2 more months. He states if he does not answer the phone then to please leave a detailed message stating if the verbal orders are approved or not.

## 2019-11-02 NOTE — Telephone Encounter (Signed)
Absolutely!  Thank you.

## 2019-11-02 NOTE — Telephone Encounter (Signed)
Dr. [name], will you please give extended verbal orders on this pt to continue PT with [facility], per [facility] her Physical Therapist. OK to extend PT for 1x a week for 2 more months?  Pt is benefiting from this service.  Please advise!

## 2019-11-02 NOTE — Telephone Encounter (Signed)
Spoke with Dorothea Ogle and informed him that Dr. [name] gives him the verbal order to continue the pts PT for once a week for the next 2 months.  Per Dorothea Ogle, pt was about to get discharged from their service a couple weeks ago, due to full progression, but then she fell and now has a hairline hip fracture.  Tyler with PT states he is working with her on that, so that's why the extended PT was requested.  Verbal orders given to [facility].  Dorothea Ogle states he will be sending his notes and orders for Dr. [name] to sign, upon return to the office.  Informed Dorothea Ogle that Dr. [name] will not be back in the office until next week on 5/5, but once she signs all orders and paperwork, I will fax them back to him shortly thereafter.  Dorothea Ogle verbalized understanding and agrees with this plan. Dorothea Ogle was gracious for all the assistance provided.

## 2019-11-03 DIAGNOSIS — N186 End stage renal disease: Secondary | ICD-10-CM | POA: Diagnosis not present

## 2019-11-03 DIAGNOSIS — D631 Anemia in chronic kidney disease: Secondary | ICD-10-CM | POA: Diagnosis not present

## 2019-11-03 DIAGNOSIS — N2581 Secondary hyperparathyroidism of renal origin: Secondary | ICD-10-CM | POA: Diagnosis not present

## 2019-11-03 DIAGNOSIS — E785 Hyperlipidemia, unspecified: Secondary | ICD-10-CM | POA: Diagnosis not present

## 2019-11-03 DIAGNOSIS — Z23 Encounter for immunization: Secondary | ICD-10-CM | POA: Diagnosis not present

## 2019-11-04 DIAGNOSIS — N186 End stage renal disease: Secondary | ICD-10-CM | POA: Diagnosis not present

## 2019-11-04 DIAGNOSIS — Z992 Dependence on renal dialysis: Secondary | ICD-10-CM | POA: Diagnosis not present

## 2019-11-05 DIAGNOSIS — E8779 Other fluid overload: Secondary | ICD-10-CM | POA: Diagnosis not present

## 2019-11-05 DIAGNOSIS — N186 End stage renal disease: Secondary | ICD-10-CM | POA: Diagnosis not present

## 2019-11-05 DIAGNOSIS — D631 Anemia in chronic kidney disease: Secondary | ICD-10-CM | POA: Diagnosis not present

## 2019-11-07 DIAGNOSIS — E782 Mixed hyperlipidemia: Secondary | ICD-10-CM | POA: Diagnosis not present

## 2019-11-07 DIAGNOSIS — I2510 Atherosclerotic heart disease of native coronary artery without angina pectoris: Secondary | ICD-10-CM | POA: Diagnosis not present

## 2019-11-07 DIAGNOSIS — M1A49X0 Other secondary chronic gout, multiple sites, without tophus (tophi): Secondary | ICD-10-CM | POA: Diagnosis not present

## 2019-11-07 DIAGNOSIS — I119 Hypertensive heart disease without heart failure: Secondary | ICD-10-CM | POA: Diagnosis not present

## 2019-11-07 DIAGNOSIS — M160 Bilateral primary osteoarthritis of hip: Secondary | ICD-10-CM | POA: Diagnosis not present

## 2019-11-07 DIAGNOSIS — Z0001 Encounter for general adult medical examination with abnormal findings: Secondary | ICD-10-CM | POA: Diagnosis not present

## 2019-11-07 DIAGNOSIS — J4540 Moderate persistent asthma, uncomplicated: Secondary | ICD-10-CM | POA: Diagnosis not present

## 2019-11-07 DIAGNOSIS — E1151 Type 2 diabetes mellitus with diabetic peripheral angiopathy without gangrene: Secondary | ICD-10-CM | POA: Diagnosis not present

## 2019-11-08 DIAGNOSIS — N186 End stage renal disease: Secondary | ICD-10-CM | POA: Diagnosis not present

## 2019-11-08 DIAGNOSIS — D631 Anemia in chronic kidney disease: Secondary | ICD-10-CM | POA: Diagnosis not present

## 2019-11-08 DIAGNOSIS — E8779 Other fluid overload: Secondary | ICD-10-CM | POA: Diagnosis not present

## 2019-11-09 DIAGNOSIS — D631 Anemia in chronic kidney disease: Secondary | ICD-10-CM | POA: Diagnosis not present

## 2019-11-09 DIAGNOSIS — E1122 Type 2 diabetes mellitus with diabetic chronic kidney disease: Secondary | ICD-10-CM | POA: Diagnosis not present

## 2019-11-09 DIAGNOSIS — I252 Old myocardial infarction: Secondary | ICD-10-CM | POA: Diagnosis not present

## 2019-11-09 DIAGNOSIS — I132 Hypertensive heart and chronic kidney disease with heart failure and with stage 5 chronic kidney disease, or end stage renal disease: Secondary | ICD-10-CM | POA: Diagnosis not present

## 2019-11-09 DIAGNOSIS — E1151 Type 2 diabetes mellitus with diabetic peripheral angiopathy without gangrene: Secondary | ICD-10-CM | POA: Diagnosis not present

## 2019-11-09 DIAGNOSIS — I5042 Chronic combined systolic (congestive) and diastolic (congestive) heart failure: Secondary | ICD-10-CM | POA: Diagnosis not present

## 2019-11-09 DIAGNOSIS — I2510 Atherosclerotic heart disease of native coronary artery without angina pectoris: Secondary | ICD-10-CM | POA: Diagnosis not present

## 2019-11-09 DIAGNOSIS — N186 End stage renal disease: Secondary | ICD-10-CM | POA: Diagnosis not present

## 2019-11-09 DIAGNOSIS — U071 COVID-19: Secondary | ICD-10-CM | POA: Diagnosis not present

## 2019-11-10 DIAGNOSIS — E8779 Other fluid overload: Secondary | ICD-10-CM | POA: Diagnosis not present

## 2019-11-10 DIAGNOSIS — M109 Gout, unspecified: Secondary | ICD-10-CM | POA: Diagnosis not present

## 2019-11-10 DIAGNOSIS — E119 Type 2 diabetes mellitus without complications: Secondary | ICD-10-CM | POA: Diagnosis not present

## 2019-11-10 DIAGNOSIS — D631 Anemia in chronic kidney disease: Secondary | ICD-10-CM | POA: Diagnosis not present

## 2019-11-10 DIAGNOSIS — N186 End stage renal disease: Secondary | ICD-10-CM | POA: Diagnosis not present

## 2019-11-12 DIAGNOSIS — D631 Anemia in chronic kidney disease: Secondary | ICD-10-CM | POA: Diagnosis not present

## 2019-11-12 DIAGNOSIS — E1122 Type 2 diabetes mellitus with diabetic chronic kidney disease: Secondary | ICD-10-CM | POA: Diagnosis not present

## 2019-11-12 DIAGNOSIS — I132 Hypertensive heart and chronic kidney disease with heart failure and with stage 5 chronic kidney disease, or end stage renal disease: Secondary | ICD-10-CM | POA: Diagnosis not present

## 2019-11-12 DIAGNOSIS — E1151 Type 2 diabetes mellitus with diabetic peripheral angiopathy without gangrene: Secondary | ICD-10-CM | POA: Diagnosis not present

## 2019-11-12 DIAGNOSIS — I2510 Atherosclerotic heart disease of native coronary artery without angina pectoris: Secondary | ICD-10-CM | POA: Diagnosis not present

## 2019-11-12 DIAGNOSIS — M109 Gout, unspecified: Secondary | ICD-10-CM | POA: Diagnosis not present

## 2019-11-12 DIAGNOSIS — N186 End stage renal disease: Secondary | ICD-10-CM | POA: Diagnosis not present

## 2019-11-12 DIAGNOSIS — E8779 Other fluid overload: Secondary | ICD-10-CM | POA: Diagnosis not present

## 2019-11-12 DIAGNOSIS — I252 Old myocardial infarction: Secondary | ICD-10-CM | POA: Diagnosis not present

## 2019-11-12 DIAGNOSIS — I5042 Chronic combined systolic (congestive) and diastolic (congestive) heart failure: Secondary | ICD-10-CM | POA: Diagnosis not present

## 2019-11-15 DIAGNOSIS — I871 Compression of vein: Secondary | ICD-10-CM | POA: Diagnosis not present

## 2019-11-15 DIAGNOSIS — N186 End stage renal disease: Secondary | ICD-10-CM | POA: Diagnosis not present

## 2019-11-15 DIAGNOSIS — D631 Anemia in chronic kidney disease: Secondary | ICD-10-CM | POA: Diagnosis not present

## 2019-11-15 DIAGNOSIS — E1151 Type 2 diabetes mellitus with diabetic peripheral angiopathy without gangrene: Secondary | ICD-10-CM | POA: Diagnosis not present

## 2019-11-15 DIAGNOSIS — T82868A Thrombosis of vascular prosthetic devices, implants and grafts, initial encounter: Secondary | ICD-10-CM | POA: Diagnosis not present

## 2019-11-15 DIAGNOSIS — E8779 Other fluid overload: Secondary | ICD-10-CM | POA: Diagnosis not present

## 2019-11-15 DIAGNOSIS — Z4901 Encounter for fitting and adjustment of extracorporeal dialysis catheter: Secondary | ICD-10-CM | POA: Diagnosis not present

## 2019-11-15 DIAGNOSIS — E1122 Type 2 diabetes mellitus with diabetic chronic kidney disease: Secondary | ICD-10-CM | POA: Diagnosis not present

## 2019-11-16 DIAGNOSIS — N186 End stage renal disease: Secondary | ICD-10-CM | POA: Diagnosis not present

## 2019-11-16 DIAGNOSIS — I5043 Acute on chronic combined systolic (congestive) and diastolic (congestive) heart failure: Secondary | ICD-10-CM | POA: Diagnosis not present

## 2019-11-16 DIAGNOSIS — J45909 Unspecified asthma, uncomplicated: Secondary | ICD-10-CM | POA: Diagnosis not present

## 2019-11-16 DIAGNOSIS — D631 Anemia in chronic kidney disease: Secondary | ICD-10-CM | POA: Diagnosis not present

## 2019-11-16 DIAGNOSIS — E8779 Other fluid overload: Secondary | ICD-10-CM | POA: Diagnosis not present

## 2019-11-17 DIAGNOSIS — N186 End stage renal disease: Secondary | ICD-10-CM | POA: Diagnosis not present

## 2019-11-17 DIAGNOSIS — E8779 Other fluid overload: Secondary | ICD-10-CM | POA: Diagnosis not present

## 2019-11-17 DIAGNOSIS — L600 Ingrowing nail: Secondary | ICD-10-CM | POA: Diagnosis not present

## 2019-11-17 DIAGNOSIS — E1151 Type 2 diabetes mellitus with diabetic peripheral angiopathy without gangrene: Secondary | ICD-10-CM | POA: Diagnosis not present

## 2019-11-17 DIAGNOSIS — D631 Anemia in chronic kidney disease: Secondary | ICD-10-CM | POA: Diagnosis not present

## 2019-11-19 DIAGNOSIS — N186 End stage renal disease: Secondary | ICD-10-CM | POA: Diagnosis not present

## 2019-11-19 DIAGNOSIS — E8779 Other fluid overload: Secondary | ICD-10-CM | POA: Diagnosis not present

## 2019-11-19 DIAGNOSIS — D631 Anemia in chronic kidney disease: Secondary | ICD-10-CM | POA: Diagnosis not present

## 2019-11-21 ENCOUNTER — Encounter: Payer: Self-pay | Admitting: Cardiology

## 2019-11-21 ENCOUNTER — Other Ambulatory Visit: Payer: Self-pay

## 2019-11-21 ENCOUNTER — Ambulatory Visit (INDEPENDENT_AMBULATORY_CARE_PROVIDER_SITE_OTHER): Payer: Medicare HMO | Admitting: Cardiology

## 2019-11-21 VITALS — BP 150/70 | HR 83 | Ht 62.0 in | Wt 170.8 lb

## 2019-11-21 DIAGNOSIS — E785 Hyperlipidemia, unspecified: Secondary | ICD-10-CM | POA: Diagnosis not present

## 2019-11-21 DIAGNOSIS — N186 End stage renal disease: Secondary | ICD-10-CM

## 2019-11-21 DIAGNOSIS — I255 Ischemic cardiomyopathy: Secondary | ICD-10-CM

## 2019-11-21 DIAGNOSIS — I25810 Atherosclerosis of coronary artery bypass graft(s) without angina pectoris: Secondary | ICD-10-CM | POA: Diagnosis not present

## 2019-11-21 DIAGNOSIS — R079 Chest pain, unspecified: Secondary | ICD-10-CM

## 2019-11-21 NOTE — Progress Notes (Signed)
Cardiology Office Note   Date:  11/21/2019   ID:  Monica [name], DOB 12/06/1930, MRN [id number]  PCP:  Benito Mccreedy, MD  Cardiologist:  Dr. [name]    Chief Complaint  Patient presents with  . Coronary Artery Disease      History of Present Illness: Monica [name] is a 86 y.o. female who presents for follow up of HTN and angina.  She has a history ofCAD (s/p CABG in 2017, NSTEMI 01/2019 s/p PCI, NSTEMI 07/2019 s/p PCI ), chronic combined CHF, moderate mitral regurgitation, anemia, bradycardia/sinus node dysfunction, ESRD on HD (TTS), GERD, HLD and DM who presents for follow-up.  She has had a tumultous course in the last year. She was admitted July 2020 forNSTEMI. Cath 01/07/2019 cath showed occluded/atretic SVG to the distal RCA, severe proximal to stent and in-stent restenosis of SVG to OM with successful PTCA/with overlapping DES stent and successful DES to the ostial proximal SVG to the RI.Echo showed new reduced LVEF 30 to 35% with moderately dilated right and left atrium moderate MR and mild AI. She was placed on ASA, Plavix and metoprolol. She was readmitted in mid-July with fatigue and dizziness with junctional bradycardia HR in the 40s, therefore metoprolol was discontinued. She was then again readmitted 02/10/19 with chest pain and elevated troponin. Nuclear stress test was abnormal, prompting repeat cath with stable disease and no targets for PCI. Medical therapy was recommended. She has been readmitted several times since then for volume overload or chest pain. She tested positive for Covid 05/31/19 as well as on admission 06/21/19. During November 2020 admission, plan was for possible amyloid work-up but both the daughter and the patient were requesting discharge after she was feeling better. During her December 2020 admission with chest pain, per discussion with patient and daughter, medical therapy was continued, reserving cath for recurrent symptoms. There was a period of time she  was also reported to be refusing medications and thought to be incompetent per daughter. Palliative careconceptwas introduced last year but not well received per notes.   She was readmitted 07/2019 with chest pain waking her out of sleep. Initial electrocardiogram showed marked ST depression in the inferolateral leads. F/u ECGshowed junctional rhythm, left axis deviation, improvement in ST depression. Her carvedilol was held due to junctional rhythm. hsTroponin peaked at 3107. She underwent cath with PCI as below with successful restenting of the SVG-ramus branch in-stent restenosis with DES. Her LVEDP was elevated. The day after cath she developed recurrent chest pain, SOB, and onset of bradycardia in the low-mid 40s. She received SL NTG with only transient relief. She was taken back to the cath lab as above without culprit lesion for recurrence of anginal pain, suspect possible residual downstream occlusion from initial PCI on 07/11/2019. She had normal LVEDP. Her carvedilol was definitively discontinued. Last labs otherwise personally reviewed from 07/2019 include LDL of 66, K 4.7, Cr 4.88, albumin 3.2, Hgb 13.3, normal AST/ALT.  Visit 08/10/19  She was doing well. She's been overall feeling good. She did have 3 episodes since last visit of chest discomfort that felt like indigestion, provoked solely by eating. This was not like prior angina, reminded her of reflux. She took a SL NTG each time and it resolved after 15-20 minutes. She has been increasing her activity a little bit around the house and does not have any exertional angina. Her breathing has been stable. No edema, orthopnea, palpitations, bleeding. Initial BP slightly elevated at 152/78, recheck by me was 127/62. She  does report her BP occasionally drops at HD but is not sure of the values.  4/7/seen in ER with chest pain and hip pain.  3 SL NTG relieved sharp chest pain.  Mild SOB did not feel like prior cardiac issues. She had fallen 10 days  prior.  Hs Troponin 300 & 331, CBC was normal and BMP with K+ 4.7.  BP there with hip pain was 173/81.  Last visit 10/17/19 she was with her son.  Her daughter is on phone on speaker.  She feels well, she walked in with cane, when I have seen she is in wheel chair.  She feels like she is doing well at dialysis.  She still with have episodic chest pain relieved with SL NTG usually after meals.  This may be all GI   She does not know who did her last colonoscopy.   Today her daughter is with her.  She is doing well, walked to room with cane.  Still with occ chest pain, but has improved with increase of protonix.  She also notes with raw vegetables she is more likely to have chest pain.  She will take NTG with improvement. She believes they take of enough fluid at dialysis.  Her BP has been running higher.     Past Medical History:  Diagnosis Date  . Anemia   . Asthma   . Chronic combined systolic and diastolic CHF (congestive heart failure) ([place])    a. Echo 11/19 [facility]):  EF 55-65, normal wall motion, normal diastolic function. b. Echo 01/2019 EF 30-35%, moderate MR, mild AI.  Marland Kitchen Coronary artery disease    a. s/p CABG 2017. b.s/p NSTEMI 11/19 [facility]) >> S-PDA 100 >>PCI: DES to S-OM. c. NSTEMI 01/2019 - occluded SVG-distal RCA, severe proximal to stent/ISR of SVG-OM s/p overlapping DES, DES to ostial prox SVG to the RI. d. NSTEMI 07/2019 s/p DES to ramus ISR.  Marland Kitchen COVID-19 virus infection 05/2019  . ESRD (end stage renal disease)    Dialysis Tu, Th, Sa  . Essential hypertension   . Gastroesophageal reflux disease   . History of blood transfusion   . Hyperlipidemia   . Junctional bradycardia   . Moderate mitral regurgitation   . Peptic ulcer   . Protein calorie malnutrition ([place])   . Sinus node dysfunction ([facility])   . Type 2 diabetes mellitus ([place])     Past Surgical History:  Procedure Laterality Date  . ABDOMINAL HYSTERECTOMY    . CARDIAC CATHETERIZATION N/A 09/17/2015    Procedure: Left Heart Cath and Coronary Angiography;  Surgeon: Jettie Booze, MD;  Location: [place] CV LAB;  Service: Cardiovascular;  Laterality: N/A;  . COLON SURGERY    . CORONARY ARTERY BYPASS GRAFT N/A 09/19/2015   Procedure: CORONARY ARTERY BYPASS GRAFTING (CABG) TIMES FOUR USING BILATARAL SAPHENOUS VEIN GRAFTS AND LEFT INTERNAL MAMMARY ARTERY;  Surgeon: [name], MD;  Location: [place];  Service: Open Heart Surgery;  Laterality: N/A;  . CORONARY STENT INTERVENTION N/A 01/10/2019   Procedure: CORONARY STENT INTERVENTION;  Surgeon: Leonie Man, MD;  Location: [place] CV LAB;  Service: Cardiovascular;  Laterality: N/A;  . CORONARY STENT INTERVENTION N/A 07/11/2019   Procedure: CORONARY STENT INTERVENTION;  Surgeon: Lorretta Harp, MD;  Location: [place] CV LAB;  Service: Cardiovascular;  Laterality: N/A;  . LEFT HEART CATH AND CORONARY ANGIOGRAPHY N/A 07/12/2019   Procedure: LEFT HEART CATH AND CORONARY ANGIOGRAPHY;  Surgeon: Leonie Man, MD;  Location: [place] CV LAB;  Service: Cardiovascular;  Laterality: N/A;  . LEFT HEART CATH AND CORS/GRAFTS ANGIOGRAPHY N/A 01/10/2019   Procedure: LEFT HEART CATH AND CORS/GRAFTS ANGIOGRAPHY;  Surgeon: Leonie Man, MD;  Location: [place] CV LAB;  Service: Cardiovascular;  Laterality: N/A;  . LEFT HEART CATH AND CORS/GRAFTS ANGIOGRAPHY N/A 02/11/2019   Procedure: LEFT HEART CATH AND CORS/GRAFTS ANGIOGRAPHY;  Surgeon: Burnell Blanks, MD;  Location: [place] CV LAB;  Service: Cardiovascular;  Laterality: N/A;  . LEFT HEART CATH AND CORS/GRAFTS ANGIOGRAPHY N/A 07/11/2019   Procedure: LEFT HEART CATH AND CORS/GRAFTS ANGIOGRAPHY;  Surgeon: Lorretta Harp, MD;  Location: [place] CV LAB;  Service: Cardiovascular;  Laterality: N/A;  . TEE WITHOUT CARDIOVERSION N/A 09/19/2015   Procedure: TRANSESOPHAGEAL ECHOCARDIOGRAM (TEE);  Surgeon: [name], MD;  Location: [place];  Service: Open Heart Surgery;   Laterality: N/A;     Current Outpatient Medications  Medication Sig Dispense Refill  . acetaminophen (TYLENOL) 500 MG tablet Take 1 tablet (500 mg total) by mouth every 6 (six) hours as needed for mild pain or headache. 30 tablet 0  . albuterol (PROVENTIL HFA;VENTOLIN HFA) 108 (90 Base) MCG/ACT inhaler Inhale 2 puffs into the lungs every 6 (six) hours as needed for wheezing or shortness of breath.    . allopurinol (ZYLOPRIM) 100 MG tablet Take 100 mg by mouth 2 (two) times daily.     Marland Kitchen aspirin EC 81 MG EC tablet Take 1 tablet (81 mg total) by mouth daily.    Marland Kitchen atorvastatin (LIPITOR) 80 MG tablet Take 80 mg by mouth daily at 6 PM.     . brimonidine (ALPHAGAN P) 0.1 % SOLN Place 1 drop into both eyes 2 (two) times daily.    . Cholecalciferol (VITAMIN D-3) 1000 units CAPS Take 1,000 Units by mouth daily.     . clopidogrel (PLAVIX) 75 MG tablet Take 1 tablet (75 mg total) by mouth daily. 90 tablet 3  . Cranberry 500 MG CAPS Take 1 capsule by mouth daily.    . diphenhydrAMINE (BENADRYL) 25 mg capsule Take 25 mg by mouth as needed.    . ferric citrate (AURYXIA) 1 GM 210 MG(Fe) tablet Take 210 mg by mouth 3 (three) times daily with meals.     . Insulin Degludec (TRESIBA FLEXTOUCH [place]) Inject 20 Units into the skin daily. Per patient taking 12 units    . isosorbide mononitrate (IMDUR) 60 MG 24 hr tablet Take 1 tablet (60 mg total) by mouth 2 (two) times daily. 60 tablet 6  . latanoprost (XALATAN) 0.005 % ophthalmic solution Place 1 drop into both eyes at bedtime.    Marland Kitchen loratadine (CLARITIN) 10 MG tablet Take 10 mg by mouth as needed for allergies.     . Melatonin 5 MG CAPS Take 5 mg by mouth at bedtime.     . multivitamin (RENA-VIT) TABS tablet Take 1 tablet by mouth daily.    . nitroGLYCERIN (NITROSTAT) 0.4 MG SL tablet Place 1 tablet (0.4 mg total) under the tongue every 5 (five) minutes as needed for chest pain. 25 tablet 12  . pantoprazole (PROTONIX) 40 MG tablet Take 1 tablet (40 mg total) by  mouth 2 (two) times daily. 180 tablet 3  . Probiotic Product (PROBIOTIC PO) Take 1 tablet by mouth daily. 50 billion cfus     No current facility-administered medications for this visit.    Allergies:   Beta adrenergic blockers    Social History:  The  patient  reports that she has never smoked. She has quit using smokeless tobacco.  Her smokeless tobacco use included snuff. She reports that she does not drink alcohol or use drugs.   Family History:  The patient's family history includes Heart attack in her father.    ROS:  General:no colds or fevers, + weight increase since April. Skin:no rashes or ulcers HEENT:no blurred vision, no congestion CV:see HPI PUL:see HPI GI:no diarrhea constipation or melena, no indigestion GU:no hematuria, no dysuria MS:no joint pain, no claudication Neuro:no syncope, no lightheadedness Endo:+ diabetes, no thyroid disease  Wt Readings from Last 3 Encounters:  11/21/19 170 lb 12.8 oz (77.5 kg)  10/17/19 169 lb (76.7 kg)  10/12/19 162 lb (73.5 kg)     PHYSICAL EXAM: VS:  BP (!) 150/70   Pulse 83   Ht 5\' 2"  (1.575 m)   Wt 170 lb 12.8 oz (77.5 kg)   SpO2 98%   BMI 31.24 kg/m  , BMI Body mass index is 31.24 kg/m. General:Pleasant affect, NAD Skin:Warm and dry, brisk capillary refill HEENT:normocephalic, sclera clear, mucus membranes moist Neck:supple, no JVD, no bruits  Heart:S1S2 RRR without murmur, gallup, rub or click Lungs:clear without rales, rhonchi, or wheezes YIR:SWNI, non tender, + BS, do not palpate liver spleen or masses Ext:no lower ext edema, 2+ pedal pulses, 2+ radial pulses Neuro:alert and oriented, MAE, follows commands, + facial symmetry    EKG:  EKG is NOT ordered today.   Recent Labs: 05/31/2019: B Natriuretic Peptide 3,301.8 06/21/2019: TSH 4.803 07/11/2019: ALT 27 10/12/2019: BUN 40; Creatinine, Ser 6.06; Hemoglobin 12.1; Platelets 269; Potassium 4.7; Sodium 135    Lipid Panel    Component Value Date/Time    CHOL 134 07/12/2019 0845   CHOL 132 04/29/2019 0940   TRIG 78 07/12/2019 0845   HDL 52 07/12/2019 0845   HDL 48 04/29/2019 0940   CHOLHDL 2.6 07/12/2019 0845   VLDL 16 07/12/2019 0845   LDLCALC 66 07/12/2019 0845   LDLCALC 68 04/29/2019 0940       Other studies Reviewed: Additional studies/ records that were reviewed today include: . Cardiac cath 07/11/19  Mid RCA lesion is 100% stenosed.  Origin to Prox Graft lesion is 100% stenosed.  Ost LM to Mid LM lesion is 60% stenosed.  Ost Cx to Prox Cx lesion is 100% stenosed.  Ost LAD to Prox LAD lesion is 99% stenosed.  Prox LAD to Mid LAD lesion is 100% stenosed.  Dist Graft to Insertion lesion is 99% stenosed.  Previously placed Origin to Prox Graft stent (unknown type) is widely patent.  A drug-eluting stent was successfully placed.  Post intervention, there is a 0% residual stenosis.  high-grade in-stent restenosis within the distal ramus intermedius branch SVG stent. The LIMA to the LAD was patent. The vein to the obtuse marginal branch was patent as well (proximal stented segment widely patent). The native right and RCA SVG were known to be occluded. Her LVEDP was significantly elevated at 30. She was dialyzed yesterday. Her serum potassium was 6.2 by i-STAT. She had successful restenting of the ramus branch in-stent restenosis with a synergy drug-eluting stent with excellent result  Cardiac cath 07/12/19 done for recurrent chest pain.  Post dialysis.  HR was low as well.     ------ NATIVE CORONARIES -------  Dist LM to Prox LAD lesion is 30% stenosed.  Mid LAD-1 lesion is 95% stenosed. Mid LAD-2 lesion is 100% stenosed.  Ost Cx to Mid Cx lesion is  100% stenosed. Ramus lesion is 99% stenosed.  Prox RCA lesion is 70% stenosed. Prox RCA to Mid RCA lesion is 90% stenosed. Mid RCA lesion is 95% stenosed. Dist RCA lesion is 100% stenosed with 100% stenosed side branch in RPAV.  Some of the distal RCA branches are  filled via left to right collaterals  ------ GRAFTS ------  LIMA graft was not injected as it was widely patent on January 4  SVG-dRCA graft was not visualized -- known occlusion.  SVG-OM1 is large. Previously placed Mid Graft to Insertion stent (2 previous overlapping drug-eluting stents covered with a third drug-eluting stent) is widely patent.  SVG-RI is large. Previously placed Origin to Prox Graft stent (drug-eluting stent) is widely patent. (There is mild competitive flow)  ------------  LV end diastolic pressure is normal.  Mid Graft to Dist Graft lesion is 100% stenosed.  SUMMARY  Widely patent SVG-OM stent placed on 07/11/2019 with no residual stenosis. Also widely patent SVG-RI with previous stent placed in July 2020  Stable occlusive native CAD with known occlusion of native RCA, LAD and circumflex. Severe disease of the proximal RI.  Known patent LIMA-LAD and occluded SVG-RCA.  Normal LVEDP  No culprit lesion for recurrence of anginal pain. Suspect possible residual downstream occlusion from initial PCI on 07/11/2019   RECOMMENDATIONS  Continue current medications, anticipate possible discharge tomorrow if stable.  Continue medical management of existing CAD.    ASSESSMENT AND PLAN:  1.  occ chest pain, may have GI component -increase of protonix has helped.  NTG also helps.  If it occurs more freq she will call us.  Follow up with Dr. [name] next month.  2.  HTN elevated, no BB as she has had several episodes of junct rhythm with BB. If BP stays elevated would begin hydralazine.    3.  CAD with CABG 2017, NSTEMI PCI 01/2019 in Jan 2021 NSTEMI and stent to VG to ramus for instent restenosis. Last cath later same day for severe chest pain- recurrent after PCI was patent stents. Felt to be debris distal Stent post cath. She is on imdur 60 BID  On ASA plavix as well.    4. ESRD on HD T,TH, SAT per renal   5.  Diabetes per PCP   6.  Bradycardia on rate  slowing meds.   7. Cardiomyopathy euvolemic with HD.  Unable to use ACE, ARB    She and her daughter have had COVID vaccine.     Current medicines are reviewed with the patient today.  The patient Has no concerns regarding medicines.  The following changes have been made:  See above Labs/ tests ordered today include:see above  Disposition:   FU:  see above  Signed, Cecilie Kicks, NP  11/21/2019 2:58 PM    [facility] [place], [place], [address] [place], [place] Phone: [phone]; Fax: [phone]

## 2019-11-21 NOTE — Patient Outreach (Signed)
[place] [facility]) Care Management  11/21/2019  Demetrica Zipp [birth year] [id number]   Telephone Assessment    Outreach attempt # 1 to patient. A female answered the phone but immediately hung up. RN CM called back and no answer after several rings.       Plan: RN CM will make outreach attempt to patient within 3-4 business days.   Enzo Montgomery, RN,BSN,CCM [place] Management Telephonic Care Management Coordinator Direct Phone: [phone] Toll Free: [phone] Fax: [phone]

## 2019-11-21 NOTE — Patient Instructions (Signed)
Medication Instructions:   Your physician recommends that you continue on your current medications as directed. Please refer to the Current Medication list given to you today.  *If you need a refill on your cardiac medications before your next appointment, please call your pharmacy*  Lab Work:  None ordered today  Testing/Procedures:  None ordered today  Follow-Up:  Keep scheduled follow up with Ena Dawley, MD

## 2019-11-22 DIAGNOSIS — N186 End stage renal disease: Secondary | ICD-10-CM | POA: Diagnosis not present

## 2019-11-22 DIAGNOSIS — E8779 Other fluid overload: Secondary | ICD-10-CM | POA: Diagnosis not present

## 2019-11-22 DIAGNOSIS — D631 Anemia in chronic kidney disease: Secondary | ICD-10-CM | POA: Diagnosis not present

## 2019-11-23 ENCOUNTER — Other Ambulatory Visit: Payer: Self-pay

## 2019-11-23 DIAGNOSIS — I132 Hypertensive heart and chronic kidney disease with heart failure and with stage 5 chronic kidney disease, or end stage renal disease: Secondary | ICD-10-CM | POA: Diagnosis not present

## 2019-11-23 DIAGNOSIS — E1151 Type 2 diabetes mellitus with diabetic peripheral angiopathy without gangrene: Secondary | ICD-10-CM | POA: Diagnosis not present

## 2019-11-23 DIAGNOSIS — N186 End stage renal disease: Secondary | ICD-10-CM | POA: Diagnosis not present

## 2019-11-23 DIAGNOSIS — I5042 Chronic combined systolic (congestive) and diastolic (congestive) heart failure: Secondary | ICD-10-CM | POA: Diagnosis not present

## 2019-11-23 DIAGNOSIS — I252 Old myocardial infarction: Secondary | ICD-10-CM | POA: Diagnosis not present

## 2019-11-23 DIAGNOSIS — I2510 Atherosclerotic heart disease of native coronary artery without angina pectoris: Secondary | ICD-10-CM | POA: Diagnosis not present

## 2019-11-23 DIAGNOSIS — D631 Anemia in chronic kidney disease: Secondary | ICD-10-CM | POA: Diagnosis not present

## 2019-11-23 DIAGNOSIS — E1122 Type 2 diabetes mellitus with diabetic chronic kidney disease: Secondary | ICD-10-CM | POA: Diagnosis not present

## 2019-11-23 DIAGNOSIS — M109 Gout, unspecified: Secondary | ICD-10-CM | POA: Diagnosis not present

## 2019-11-23 NOTE — Patient Outreach (Signed)
[facility] [facility]) Care Management  11/23/2019  Monica [name] 30-Jan-1931 [id number]   Telephone Assessment    Outreach attempt #2 to patient. Spoke with patient who reports he has been ding and feeling well the past few days. She does report that her last episode of chest pain was last week. It occurred again while she was eating. She states she is really unsure if it was chest pain or indigestion. She too Nitro and Tums and sxs relieved. She voices that she feels like certain foods trigger these sxs. RNCM discussed with pt importance of eating slowly and properly chewing foods. She continues to get HHPT and therapist coming out today. Patient states she can tell she is progressing as she is able to walk further. She suffered hairline fx from fall a few weeks ago. She reports pain controlled and she has not had to take anything. She states that dialysis is going well and she is tolerating treatments. She denies any RN CM needs or concerns at this time.    [facility] CM Care Plan Problem One     Most Recent Value  Care Plan Problem One  Patient with impaired cardiac status/functioning  Role Documenting the Problem One  Care Management Telephonic Coordinator  Care Plan for Problem One  Active  THN CM Short Term Goal #1   Patient will report no CP or other cardiac sxs over the next 30 days.  THN CM Short Term Goal #1 Start Date  11/23/19  Interventions for Short Term Goal #1  RNCM assessed for acute sxs/issues. RN CM reviewed with pt s/s of worsening condition and when to seek medical attention. RN CM discused possible triggers and ways to eliminate/decrease episodes.    THN CM Care Plan Problem Two     Most Recent Value  Care Plan Problem Two  patient with impaired mobilty-at risk for falls  Role Documenting the Problem Two  Care Management Telephonic [place] for Problem Two  Active  Interventions for Problem Two Long Term Goal   RNCM discussed current progress with  therpay. RNCM reinforced fall/safety measures in the home.   THN Long Term Goal  Patient will report increased mobility/strength from HHPT sessions within the next 60 days.  THN Long Term Goal Start Date  11/23/19  [facility] CM Short Term Goal #1   Patient will report no falls within the next 30 days.  THN CM Short Term Goal #1 Start Date  11/23/19  Interventions for Short Term Goal #2   RNCM assessed for fall/safety measures and continued reinforcement of measures  THN CM Short Term Goal #2   Patient will report an acceptable level of pain over the next 30 days.  THN CM Short Term Goal #2 Start Date  11/23/19  Interventions for Short Term Goal #2  RNCM assessed for pain. RN CM discussed importance of adequate pain relief and measures.      Plan: RN CM discussed with patient next outreach within the month of June. Patient gave verbal consent and in agreement with RN CM follow up and timeframe. Patient aware that they may contact RN CM sooner for any issues or concerns.   Enzo Montgomery, RN,BSN,CCM [place] Management Telephonic Care Management Coordinator Direct Phone: [phone] Toll Free: [phone] Fax: [phone]

## 2019-11-24 DIAGNOSIS — N186 End stage renal disease: Secondary | ICD-10-CM | POA: Diagnosis not present

## 2019-11-24 DIAGNOSIS — E8779 Other fluid overload: Secondary | ICD-10-CM | POA: Diagnosis not present

## 2019-11-24 DIAGNOSIS — D631 Anemia in chronic kidney disease: Secondary | ICD-10-CM | POA: Diagnosis not present

## 2019-11-26 DIAGNOSIS — D631 Anemia in chronic kidney disease: Secondary | ICD-10-CM | POA: Diagnosis not present

## 2019-11-26 DIAGNOSIS — E8779 Other fluid overload: Secondary | ICD-10-CM | POA: Diagnosis not present

## 2019-11-26 DIAGNOSIS — N186 End stage renal disease: Secondary | ICD-10-CM | POA: Diagnosis not present

## 2019-11-29 DIAGNOSIS — D631 Anemia in chronic kidney disease: Secondary | ICD-10-CM | POA: Diagnosis not present

## 2019-11-29 DIAGNOSIS — N186 End stage renal disease: Secondary | ICD-10-CM | POA: Diagnosis not present

## 2019-11-29 DIAGNOSIS — E8779 Other fluid overload: Secondary | ICD-10-CM | POA: Diagnosis not present

## 2019-11-30 DIAGNOSIS — I2510 Atherosclerotic heart disease of native coronary artery without angina pectoris: Secondary | ICD-10-CM | POA: Diagnosis not present

## 2019-11-30 DIAGNOSIS — E1122 Type 2 diabetes mellitus with diabetic chronic kidney disease: Secondary | ICD-10-CM | POA: Diagnosis not present

## 2019-11-30 DIAGNOSIS — M109 Gout, unspecified: Secondary | ICD-10-CM | POA: Diagnosis not present

## 2019-11-30 DIAGNOSIS — I132 Hypertensive heart and chronic kidney disease with heart failure and with stage 5 chronic kidney disease, or end stage renal disease: Secondary | ICD-10-CM | POA: Diagnosis not present

## 2019-11-30 DIAGNOSIS — D631 Anemia in chronic kidney disease: Secondary | ICD-10-CM | POA: Diagnosis not present

## 2019-11-30 DIAGNOSIS — I252 Old myocardial infarction: Secondary | ICD-10-CM | POA: Diagnosis not present

## 2019-11-30 DIAGNOSIS — N186 End stage renal disease: Secondary | ICD-10-CM | POA: Diagnosis not present

## 2019-11-30 DIAGNOSIS — I5042 Chronic combined systolic (congestive) and diastolic (congestive) heart failure: Secondary | ICD-10-CM | POA: Diagnosis not present

## 2019-11-30 DIAGNOSIS — E1151 Type 2 diabetes mellitus with diabetic peripheral angiopathy without gangrene: Secondary | ICD-10-CM | POA: Diagnosis not present

## 2019-12-01 DIAGNOSIS — D631 Anemia in chronic kidney disease: Secondary | ICD-10-CM | POA: Diagnosis not present

## 2019-12-01 DIAGNOSIS — E8779 Other fluid overload: Secondary | ICD-10-CM | POA: Diagnosis not present

## 2019-12-01 DIAGNOSIS — N186 End stage renal disease: Secondary | ICD-10-CM | POA: Diagnosis not present

## 2019-12-03 DIAGNOSIS — N186 End stage renal disease: Secondary | ICD-10-CM | POA: Diagnosis not present

## 2019-12-03 DIAGNOSIS — E8779 Other fluid overload: Secondary | ICD-10-CM | POA: Diagnosis not present

## 2019-12-03 DIAGNOSIS — D631 Anemia in chronic kidney disease: Secondary | ICD-10-CM | POA: Diagnosis not present

## 2019-12-05 DIAGNOSIS — Z992 Dependence on renal dialysis: Secondary | ICD-10-CM | POA: Diagnosis not present

## 2019-12-05 DIAGNOSIS — N186 End stage renal disease: Secondary | ICD-10-CM | POA: Diagnosis not present

## 2019-12-06 DIAGNOSIS — D509 Iron deficiency anemia, unspecified: Secondary | ICD-10-CM | POA: Diagnosis not present

## 2019-12-06 DIAGNOSIS — D631 Anemia in chronic kidney disease: Secondary | ICD-10-CM | POA: Diagnosis not present

## 2019-12-06 DIAGNOSIS — N186 End stage renal disease: Secondary | ICD-10-CM | POA: Diagnosis not present

## 2019-12-07 DIAGNOSIS — D631 Anemia in chronic kidney disease: Secondary | ICD-10-CM | POA: Diagnosis not present

## 2019-12-07 DIAGNOSIS — E1151 Type 2 diabetes mellitus with diabetic peripheral angiopathy without gangrene: Secondary | ICD-10-CM | POA: Diagnosis not present

## 2019-12-07 DIAGNOSIS — I252 Old myocardial infarction: Secondary | ICD-10-CM | POA: Diagnosis not present

## 2019-12-07 DIAGNOSIS — I2510 Atherosclerotic heart disease of native coronary artery without angina pectoris: Secondary | ICD-10-CM | POA: Diagnosis not present

## 2019-12-07 DIAGNOSIS — I132 Hypertensive heart and chronic kidney disease with heart failure and with stage 5 chronic kidney disease, or end stage renal disease: Secondary | ICD-10-CM | POA: Diagnosis not present

## 2019-12-07 DIAGNOSIS — N186 End stage renal disease: Secondary | ICD-10-CM | POA: Diagnosis not present

## 2019-12-07 DIAGNOSIS — I5042 Chronic combined systolic (congestive) and diastolic (congestive) heart failure: Secondary | ICD-10-CM | POA: Diagnosis not present

## 2019-12-07 DIAGNOSIS — M109 Gout, unspecified: Secondary | ICD-10-CM | POA: Diagnosis not present

## 2019-12-07 DIAGNOSIS — E1122 Type 2 diabetes mellitus with diabetic chronic kidney disease: Secondary | ICD-10-CM | POA: Diagnosis not present

## 2019-12-08 DIAGNOSIS — M109 Gout, unspecified: Secondary | ICD-10-CM | POA: Diagnosis not present

## 2019-12-08 DIAGNOSIS — D509 Iron deficiency anemia, unspecified: Secondary | ICD-10-CM | POA: Diagnosis not present

## 2019-12-08 DIAGNOSIS — N186 End stage renal disease: Secondary | ICD-10-CM | POA: Diagnosis not present

## 2019-12-08 DIAGNOSIS — E119 Type 2 diabetes mellitus without complications: Secondary | ICD-10-CM | POA: Diagnosis not present

## 2019-12-08 DIAGNOSIS — D631 Anemia in chronic kidney disease: Secondary | ICD-10-CM | POA: Diagnosis not present

## 2019-12-10 DIAGNOSIS — D631 Anemia in chronic kidney disease: Secondary | ICD-10-CM | POA: Diagnosis not present

## 2019-12-10 DIAGNOSIS — D509 Iron deficiency anemia, unspecified: Secondary | ICD-10-CM | POA: Diagnosis not present

## 2019-12-10 DIAGNOSIS — N186 End stage renal disease: Secondary | ICD-10-CM | POA: Diagnosis not present

## 2019-12-12 DIAGNOSIS — E1151 Type 2 diabetes mellitus with diabetic peripheral angiopathy without gangrene: Secondary | ICD-10-CM | POA: Diagnosis not present

## 2019-12-12 DIAGNOSIS — E1122 Type 2 diabetes mellitus with diabetic chronic kidney disease: Secondary | ICD-10-CM | POA: Diagnosis not present

## 2019-12-12 DIAGNOSIS — I132 Hypertensive heart and chronic kidney disease with heart failure and with stage 5 chronic kidney disease, or end stage renal disease: Secondary | ICD-10-CM | POA: Diagnosis not present

## 2019-12-12 DIAGNOSIS — I252 Old myocardial infarction: Secondary | ICD-10-CM | POA: Diagnosis not present

## 2019-12-12 DIAGNOSIS — D631 Anemia in chronic kidney disease: Secondary | ICD-10-CM | POA: Diagnosis not present

## 2019-12-12 DIAGNOSIS — N186 End stage renal disease: Secondary | ICD-10-CM | POA: Diagnosis not present

## 2019-12-12 DIAGNOSIS — I5042 Chronic combined systolic (congestive) and diastolic (congestive) heart failure: Secondary | ICD-10-CM | POA: Diagnosis not present

## 2019-12-12 DIAGNOSIS — M109 Gout, unspecified: Secondary | ICD-10-CM | POA: Diagnosis not present

## 2019-12-12 DIAGNOSIS — I2510 Atherosclerotic heart disease of native coronary artery without angina pectoris: Secondary | ICD-10-CM | POA: Diagnosis not present

## 2019-12-13 DIAGNOSIS — N186 End stage renal disease: Secondary | ICD-10-CM | POA: Diagnosis not present

## 2019-12-13 DIAGNOSIS — D509 Iron deficiency anemia, unspecified: Secondary | ICD-10-CM | POA: Diagnosis not present

## 2019-12-13 DIAGNOSIS — D631 Anemia in chronic kidney disease: Secondary | ICD-10-CM | POA: Diagnosis not present

## 2019-12-14 ENCOUNTER — Encounter: Payer: Self-pay | Admitting: Cardiology

## 2019-12-14 ENCOUNTER — Ambulatory Visit (INDEPENDENT_AMBULATORY_CARE_PROVIDER_SITE_OTHER): Payer: Medicare HMO | Admitting: Cardiology

## 2019-12-14 ENCOUNTER — Other Ambulatory Visit: Payer: Self-pay

## 2019-12-14 VITALS — BP 138/76 | HR 82 | Ht 62.0 in | Wt 168.2 lb

## 2019-12-14 DIAGNOSIS — R079 Chest pain, unspecified: Secondary | ICD-10-CM | POA: Diagnosis not present

## 2019-12-14 DIAGNOSIS — R001 Bradycardia, unspecified: Secondary | ICD-10-CM

## 2019-12-14 DIAGNOSIS — I5042 Chronic combined systolic (congestive) and diastolic (congestive) heart failure: Secondary | ICD-10-CM | POA: Diagnosis not present

## 2019-12-14 DIAGNOSIS — I25810 Atherosclerosis of coronary artery bypass graft(s) without angina pectoris: Secondary | ICD-10-CM | POA: Diagnosis not present

## 2019-12-14 NOTE — Progress Notes (Signed)
Cardiology Office Note   Date:  12/14/2019   ID:  Monica [name], DOB 21-Aug-1930, MRN [id number]  PCP:  Benito Mccreedy, MD  Cardiologist:  Dr. [name]    Reason for visit:    History of Present Illness: Monica [name] is a 86 y.o. female with history ofCAD (s/p CABG in 2017, NSTEMI 01/2019 s/p PCI, NSTEMI 07/2019 s/p PCI ), chronic combined CHF, moderate mitral regurgitation, anemia, bradycardia/sinus node dysfunction, ESRD on HD (TTS), GERD, HLD and DM who presents for follow-up.  She was admitted July 2020 forNSTEMI. Cath 01/07/2019 cath showed occluded/atretic SVG to the distal RCA, severe proximal to stent and in-stent restenosis of SVG to OM with successful PTCA/with overlapping DES stent and successful DES to the ostial proximal SVG to the RI.Echo showed new reduced LVEF 30 to 35% with moderately dilated right and left atrium moderate MR and mild AI. She was placed on ASA, Plavix and metoprolol. She was readmitted in mid-July with fatigue and dizziness with junctional bradycardia HR in the 40s, therefore metoprolol was discontinued. She was then again readmitted 02/10/19 with chest pain and elevated troponin. Nuclear stress test was abnormal, prompting repeat cath with stable disease and no targets for PCI. Medical therapy was recommended. She has been readmitted several times since then for volume overload or chest pain. She tested positive for Covid 05/31/19 as well as on admission 06/21/19. During November 2020 admission, plan was for possible amyloid work-up but both the daughter and the patient were requesting discharge after she was feeling better.   She was readmitted 07/2019 with chest pain waking her out of sleep. Initial electrocardiogram showed marked ST depression in the inferolateral leads. F/u ECGshowed junctional rhythm, left axis deviation, improvement in ST depression. Her carvedilol was held due to junctional rhythm. hsTroponin peaked at 3107. She underwent cath with PCI as below  with successful restenting of the SVG-ramus branch in-stent restenosis with DES. Her LVEDP was elevated. The day after cath she developed recurrent chest pain, SOB, and onset of bradycardia in the low-mid 40s. She received SL NTG with only transient relief. She was taken back to the cath lab as above without culprit lesion for recurrence of anginal pain, suspect possible residual downstream occlusion from initial PCI on 07/11/2019. She had normal LVEDP. Her carvedilol was definitively discontinued. Last labs otherwise personally reviewed from 07/2019 include LDL of 66, K 4.7, Cr 4.88, albumin 3.2, Hgb 13.3, normal AST/ALT.  She was again seen in ER for chest pain in April, this was followed by a clinic visit, she was started on Protonix that relieved most of her chest pains.  Since the last visit she only had one episode where nitroglycerin relieved her pain within 5 minutes.  She continues with hemodialysis session.  She gets hypotension postdialysis only occasionally.  Her blood pressure runs in 130s, she denies any orthostatic hypotension dizziness or falls.  She has no lower extremity edema orthopnea proximal nocturnal dyspnea.  No palpitations.   Past Medical History:  Diagnosis Date  . Anemia   . Asthma   . Chronic combined systolic and diastolic CHF (congestive heart failure) ([place])    a. Echo 11/19 [facility]):  EF 55-65, normal wall motion, normal diastolic function. b. Echo 01/2019 EF 30-35%, moderate MR, mild AI.  Marland Kitchen Coronary artery disease    a. s/p CABG 2017. b.s/p NSTEMI 11/19 [facility]) >> S-PDA 100 >>PCI: DES to S-OM. c. NSTEMI 01/2019 - occluded SVG-distal RCA, severe proximal to stent/ISR of SVG-OM  s/p overlapping DES, DES to ostial prox SVG to the RI. d. NSTEMI 07/2019 s/p DES to ramus ISR.  Marland Kitchen COVID-19 virus infection 05/2019  . ESRD (end stage renal disease)    Dialysis Tu, Th, Sa  . Essential hypertension   . Gastroesophageal reflux disease   . History of blood transfusion     . Hyperlipidemia   . Junctional bradycardia   . Moderate mitral regurgitation   . Peptic ulcer   . Protein calorie malnutrition ([place])   . Sinus node dysfunction (H[facility])   . Type 2 diabetes mellitus ([place])     Past Surgical History:  Procedure Laterality Date  . ABDOMINAL HYSTERECTOMY    . CARDIAC CATHETERIZATION N/A 09/17/2015   Procedure: Left Heart Cath and Coronary Angiography;  Surgeon: Jettie Booze, MD;  Location: [place] [facility];  Service: Cardiovascular;  Laterality: N/A;  . COLON SURGERY    . CORONARY ARTERY BYPASS GRAFT N/A 09/19/2015   Procedure: CORONARY ARTERY BYPASS GRAFTING (CABG) TIMES FOUR USING BILATARAL SAPHENOUS VEIN GRAFTS AND LEFT INTERNAL MAMMARY ARTERY;  Surgeon: [name], MD;  Location: [place];  Service: Open Heart Surgery;  Laterality: N/A;  . CORONARY STENT INTERVENTION N/A 01/10/2019   Procedure: CORONARY STENT INTERVENTION;  Surgeon: Leonie Man, MD;  Location: [place] [facility];  Service: Cardiovascular;  Laterality: N/A;  . CORONARY STENT INTERVENTION N/A 07/11/2019   Procedure: CORONARY STENT INTERVENTION;  Surgeon: Lorretta Harp, MD;  Location: [place] [facility];  Service: Cardiovascular;  Laterality: N/A;  . LEFT HEART CATH AND CORONARY ANGIOGRAPHY N/A 07/12/2019   Procedure: LEFT HEART CATH AND CORONARY ANGIOGRAPHY;  Surgeon: Leonie Man, MD;  Location: [place] [facility];  Service: Cardiovascular;  Laterality: N/A;  . LEFT HEART CATH AND CORS/GRAFTS ANGIOGRAPHY N/A 01/10/2019   Procedure: LEFT HEART CATH AND CORS/GRAFTS ANGIOGRAPHY;  Surgeon: Leonie Man, MD;  Location: [place] [facility];  Service: Cardiovascular;  Laterality: N/A;  . LEFT HEART CATH AND CORS/GRAFTS ANGIOGRAPHY N/A 02/11/2019   Procedure: LEFT HEART CATH AND CORS/GRAFTS ANGIOGRAPHY;  Surgeon: Burnell Blanks, MD;  Location: [place] [facility];  Service: Cardiovascular;  Laterality: N/A;  . LEFT HEART CATH AND CORS/GRAFTS ANGIOGRAPHY N/A 07/11/2019    Procedure: LEFT HEART CATH AND CORS/GRAFTS ANGIOGRAPHY;  Surgeon: Lorretta Harp, MD;  Location: [place] [facility];  Service: Cardiovascular;  Laterality: N/A;  . TEE WITHOUT CARDIOVERSION N/A 09/19/2015   Procedure: TRANSESOPHAGEAL ECHOCARDIOGRAM (TEE);  Surgeon: [name], MD;  Location: [place];  Service: Open Heart Surgery;  Laterality: N/A;     Current Outpatient Medications  Medication Sig Dispense Refill  . acetaminophen (TYLENOL) 500 MG tablet Take 1 tablet (500 mg total) by mouth every 6 (six) hours as needed for mild pain or headache. 30 tablet 0  . albuterol (PROVENTIL HFA;VENTOLIN HFA) 108 (90 Base) MCG/ACT inhaler Inhale 2 puffs into the lungs every 6 (six) hours as needed for wheezing or shortness of breath.    . allopurinol (ZYLOPRIM) 100 MG tablet Take 100 mg by mouth 2 (two) times daily.     Marland Kitchen aspirin EC 81 MG EC tablet Take 1 tablet (81 mg total) by mouth daily.    Marland Kitchen atorvastatin (LIPITOR) 80 MG tablet Take 80 mg by mouth daily at 6 PM.     . brimonidine (ALPHAGAN P) 0.1 % SOLN Place 1 drop into both eyes 2 (two) times daily.    . Cholecalciferol (VITAMIN D-3) 1000 units  CAPS Take 1,000 Units by mouth daily.     . clopidogrel (PLAVIX) 75 MG tablet Take 1 tablet (75 mg total) by mouth daily. 90 tablet 3  . Cranberry 500 MG CAPS Take 1 capsule by mouth daily.    . diphenhydrAMINE (BENADRYL) 25 mg capsule Take 25 mg by mouth as needed.    . ferric citrate (AURYXIA) 1 GM 210 MG(Fe) tablet Take 210 mg by mouth 3 (three) times daily with meals.     . Insulin Degludec (TRESIBA FLEXTOUCH ) Inject 20 Units into the skin daily. Per patient taking 12 units    . isosorbide mononitrate (IMDUR) 60 MG 24 hr tablet Take 1 tablet (60 mg total) by mouth 2 (two) times daily. 60 tablet 6  . latanoprost (XALATAN) 0.005 % ophthalmic solution Place 1 drop into both eyes at bedtime.    Marland Kitchen loratadine (CLARITIN) 10 MG tablet Take 10 mg by mouth as needed for allergies.     . Melatonin 5 MG  CAPS Take 5 mg by mouth at bedtime.     . multivitamin (RENA-VIT) TABS tablet Take 1 tablet by mouth daily.    . nitroGLYCERIN (NITROSTAT) 0.4 MG SL tablet Place 1 tablet (0.4 mg total) under the tongue every 5 (five) minutes as needed for chest pain. 25 tablet 12  . pantoprazole (PROTONIX) 40 MG tablet Take 1 tablet (40 mg total) by mouth 2 (two) times daily. 180 tablet 3  . Probiotic Product (PROBIOTIC PO) Take 1 tablet by mouth daily. 50 billion cfus     No current facility-administered medications for this visit.   Allergies:   Beta adrenergic blockers   Social History:  The patient  reports that she has never smoked. She has quit using smokeless tobacco.  Her smokeless tobacco use included snuff. She reports that she does not drink alcohol or use drugs.   Family History:  The patient's family history includes Heart attack in her father.   ROS:  General:no colds or fevers, + weight increase since April. Skin:no rashes or ulcers HEENT:no blurred vision, no congestion CV:see HPI PUL:see HPI GI:no diarrhea constipation or melena, no indigestion GU:no hematuria, no dysuria MS:no joint pain, no claudication Neuro:no syncope, no lightheadedness Endo:+ diabetes, no thyroid disease  Wt Readings from Last 3 Encounters:  12/14/19 168 lb 3.2 oz (76.3 kg)  11/21/19 170 lb 12.8 oz (77.5 kg)  10/17/19 169 lb (76.7 kg)     PHYSICAL EXAM: VS:  BP 138/76   Pulse 82   Ht 5\' 2"  (1.575 m)   Wt 168 lb 3.2 oz (76.3 kg)   SpO2 99%   BMI 30.76 kg/m  , BMI Body mass index is 30.76 kg/m. General:Pleasant affect, NAD Skin:Warm and dry, brisk capillary refill HEENT:normocephalic, sclera clear, mucus membranes moist Neck:supple, no JVD, no bruits  Heart:S1S2 RRR without murmur, gallup, rub or click Lungs:clear without rales, rhonchi, or wheezes KAJ:GOTL, non tender, + BS, do not palpate liver spleen or masses Ext:no lower ext edema, 2+ pedal pulses, 2+ radial pulses Neuro:alert and oriented,  MAE, follows commands, + facial symmetry  EKG:  EKG is ordered today.  It shows normal sinus rhythm, LVH negative T waves in the lateral leads unchanged from prior.  This was personally reviewed.  Recent Labs: 05/31/2019: B Natriuretic Peptide 3,301.8 06/21/2019: TSH 4.803 07/11/2019: ALT 27 10/12/2019: BUN 40; Creatinine, Ser 6.06; Hemoglobin 12.1; Platelets 269; Potassium 4.7; Sodium 135    Lipid Panel    Component Value Date/Time  CHOL 134 07/12/2019 0845   CHOL 132 04/29/2019 0940   TRIG 78 07/12/2019 0845   HDL 52 07/12/2019 0845   HDL 48 04/29/2019 0940   CHOLHDL 2.6 07/12/2019 0845   VLDL 16 07/12/2019 0845   LDLCALC 66 07/12/2019 0845   LDLCALC 68 04/29/2019 0940   Other studies Reviewed: Additional studies/ records that were reviewed today include: . Cardiac cath 07/11/19  Mid RCA lesion is 100% stenosed.  Origin to Prox Graft lesion is 100% stenosed.  Ost LM to Mid LM lesion is 60% stenosed.  Ost Cx to Prox Cx lesion is 100% stenosed.  Ost LAD to Prox LAD lesion is 99% stenosed.  Prox LAD to Mid LAD lesion is 100% stenosed.  Dist Graft to Insertion lesion is 99% stenosed.  Previously placed Origin to Prox Graft stent (unknown type) is widely patent.  A drug-eluting stent was successfully placed.  Post intervention, there is a 0% residual stenosis.  high-grade in-stent restenosis within the distal ramus intermedius branch SVG stent. The LIMA to the LAD was patent. The vein to the obtuse marginal branch was patent as well (proximal stented segment widely patent). The native right and RCA SVG were known to be occluded. Her LVEDP was significantly elevated at 30. She was dialyzed yesterday. Her serum potassium was 6.2 by i-STAT. She had successful restenting of the ramus branch in-stent restenosis with a synergy drug-eluting stent with excellent result  Cardiac cath 07/12/19 done for recurrent chest pain.  Post dialysis.  HR was low as well.     ------  NATIVE CORONARIES -------  Dist LM to Prox LAD lesion is 30% stenosed.  Mid LAD-1 lesion is 95% stenosed. Mid LAD-2 lesion is 100% stenosed.  Ost Cx to Mid Cx lesion is 100% stenosed. Ramus lesion is 99% stenosed.  Prox RCA lesion is 70% stenosed. Prox RCA to Mid RCA lesion is 90% stenosed. Mid RCA lesion is 95% stenosed. Dist RCA lesion is 100% stenosed with 100% stenosed side branch in RPAV.  Some of the distal RCA branches are filled via left to right collaterals  ------ GRAFTS ------  LIMA graft was not injected as it was widely patent on January 4  SVG-dRCA graft was not visualized -- known occlusion.  SVG-OM1 is large. Previously placed Mid Graft to Insertion stent (2 previous overlapping drug-eluting stents covered with a third drug-eluting stent) is widely patent.  SVG-RI is large. Previously placed Origin to Prox Graft stent (drug-eluting stent) is widely patent. (There is mild competitive flow)  ------------  LV end diastolic pressure is normal.  Mid Graft to Dist Graft lesion is 100% stenosed.  SUMMARY  Widely patent SVG-OM stent placed on 07/11/2019 with no residual stenosis. Also widely patent SVG-RI with previous stent placed in July 2020  Stable occlusive native CAD with known occlusion of native RCA, LAD and circumflex. Severe disease of the proximal RI.  Known patent LIMA-LAD and occluded SVG-RCA.  Normal LVEDP  No culprit lesion for recurrence of anginal pain. Suspect possible residual downstream occlusion from initial PCI on 07/11/2019   RECOMMENDATIONS  Continue current medications, anticipate possible discharge tomorrow if stable.  Continue medical management of existing CAD.    ASSESSMENT AND PLAN:  1.  Chest pain, with some atypical features, she is encouraged to continue taking Protonix as well as nitroglycerin as needed.   Symptoms consistent with stable angina.  EKG today is unchanged.  2.  HTN borderline however sometimes  hypotension post dialysis, will continue the same  management.  3.  CAD with CABG 2017, NSTEMI PCI 01/2019 in Jan 2021 NSTEMI and stent to VG to ramus for instent restenosis. Last cath later same day for severe chest pain- recurrent after PCI was patent stents. Felt to be debris distal Stent post cath. She is on imdur 60 BID  On ASA plavix as well.    4. ESRD on HD T,TH, SAT per renal   5.  Diabetes per PCP   6.  Bradycardia on rate slowing meds.   7. Cardiomyopathy euvolemic with HD.  Unable to use ACE, ARB    Current medicines are reviewed with the patient today.  The patient Has no concerns regarding medicines.  The following changes have been made:  See above Labs/ tests ordered today include:see above  Disposition:   FU:  see above  Signed, Ena Dawley, MD  12/14/2019 3:34 PM    [facility] [address], [place], [place], [place] Phone: [phone]; Fax: [phone]

## 2019-12-14 NOTE — Patient Instructions (Signed)

## 2019-12-15 DIAGNOSIS — D509 Iron deficiency anemia, unspecified: Secondary | ICD-10-CM | POA: Diagnosis not present

## 2019-12-15 DIAGNOSIS — D631 Anemia in chronic kidney disease: Secondary | ICD-10-CM | POA: Diagnosis not present

## 2019-12-15 DIAGNOSIS — N186 End stage renal disease: Secondary | ICD-10-CM | POA: Diagnosis not present

## 2019-12-16 DIAGNOSIS — I252 Old myocardial infarction: Secondary | ICD-10-CM | POA: Diagnosis not present

## 2019-12-16 DIAGNOSIS — I2510 Atherosclerotic heart disease of native coronary artery without angina pectoris: Secondary | ICD-10-CM | POA: Diagnosis not present

## 2019-12-16 DIAGNOSIS — N186 End stage renal disease: Secondary | ICD-10-CM | POA: Diagnosis not present

## 2019-12-16 DIAGNOSIS — E1122 Type 2 diabetes mellitus with diabetic chronic kidney disease: Secondary | ICD-10-CM | POA: Diagnosis not present

## 2019-12-16 DIAGNOSIS — I5042 Chronic combined systolic (congestive) and diastolic (congestive) heart failure: Secondary | ICD-10-CM | POA: Diagnosis not present

## 2019-12-16 DIAGNOSIS — I132 Hypertensive heart and chronic kidney disease with heart failure and with stage 5 chronic kidney disease, or end stage renal disease: Secondary | ICD-10-CM | POA: Diagnosis not present

## 2019-12-16 DIAGNOSIS — M109 Gout, unspecified: Secondary | ICD-10-CM | POA: Diagnosis not present

## 2019-12-16 DIAGNOSIS — D631 Anemia in chronic kidney disease: Secondary | ICD-10-CM | POA: Diagnosis not present

## 2019-12-16 DIAGNOSIS — E1151 Type 2 diabetes mellitus with diabetic peripheral angiopathy without gangrene: Secondary | ICD-10-CM | POA: Diagnosis not present

## 2019-12-17 DIAGNOSIS — N186 End stage renal disease: Secondary | ICD-10-CM | POA: Diagnosis not present

## 2019-12-17 DIAGNOSIS — D631 Anemia in chronic kidney disease: Secondary | ICD-10-CM | POA: Diagnosis not present

## 2019-12-17 DIAGNOSIS — J45909 Unspecified asthma, uncomplicated: Secondary | ICD-10-CM | POA: Diagnosis not present

## 2019-12-17 DIAGNOSIS — I5043 Acute on chronic combined systolic (congestive) and diastolic (congestive) heart failure: Secondary | ICD-10-CM | POA: Diagnosis not present

## 2019-12-17 DIAGNOSIS — D509 Iron deficiency anemia, unspecified: Secondary | ICD-10-CM | POA: Diagnosis not present

## 2019-12-20 DIAGNOSIS — D631 Anemia in chronic kidney disease: Secondary | ICD-10-CM | POA: Diagnosis not present

## 2019-12-20 DIAGNOSIS — N186 End stage renal disease: Secondary | ICD-10-CM | POA: Diagnosis not present

## 2019-12-20 DIAGNOSIS — D509 Iron deficiency anemia, unspecified: Secondary | ICD-10-CM | POA: Diagnosis not present

## 2019-12-21 DIAGNOSIS — E1122 Type 2 diabetes mellitus with diabetic chronic kidney disease: Secondary | ICD-10-CM | POA: Diagnosis not present

## 2019-12-21 DIAGNOSIS — I132 Hypertensive heart and chronic kidney disease with heart failure and with stage 5 chronic kidney disease, or end stage renal disease: Secondary | ICD-10-CM | POA: Diagnosis not present

## 2019-12-21 DIAGNOSIS — I252 Old myocardial infarction: Secondary | ICD-10-CM | POA: Diagnosis not present

## 2019-12-21 DIAGNOSIS — E1151 Type 2 diabetes mellitus with diabetic peripheral angiopathy without gangrene: Secondary | ICD-10-CM | POA: Diagnosis not present

## 2019-12-21 DIAGNOSIS — N186 End stage renal disease: Secondary | ICD-10-CM | POA: Diagnosis not present

## 2019-12-21 DIAGNOSIS — D631 Anemia in chronic kidney disease: Secondary | ICD-10-CM | POA: Diagnosis not present

## 2019-12-21 DIAGNOSIS — M109 Gout, unspecified: Secondary | ICD-10-CM | POA: Diagnosis not present

## 2019-12-21 DIAGNOSIS — I2510 Atherosclerotic heart disease of native coronary artery without angina pectoris: Secondary | ICD-10-CM | POA: Diagnosis not present

## 2019-12-21 DIAGNOSIS — I5042 Chronic combined systolic (congestive) and diastolic (congestive) heart failure: Secondary | ICD-10-CM | POA: Diagnosis not present

## 2019-12-22 DIAGNOSIS — D631 Anemia in chronic kidney disease: Secondary | ICD-10-CM | POA: Diagnosis not present

## 2019-12-22 DIAGNOSIS — N186 End stage renal disease: Secondary | ICD-10-CM | POA: Diagnosis not present

## 2019-12-22 DIAGNOSIS — D509 Iron deficiency anemia, unspecified: Secondary | ICD-10-CM | POA: Diagnosis not present

## 2019-12-24 DIAGNOSIS — N186 End stage renal disease: Secondary | ICD-10-CM | POA: Diagnosis not present

## 2019-12-24 DIAGNOSIS — D509 Iron deficiency anemia, unspecified: Secondary | ICD-10-CM | POA: Diagnosis not present

## 2019-12-24 DIAGNOSIS — D631 Anemia in chronic kidney disease: Secondary | ICD-10-CM | POA: Diagnosis not present

## 2019-12-26 ENCOUNTER — Other Ambulatory Visit: Payer: Self-pay

## 2019-12-26 NOTE — Patient Outreach (Signed)
[facility]) Care Management  12/26/2019  Monica [name] May 14, 1931 [id number]   Telephone Assessment   Outreach attempt to patient. No answer at present.     Plan: RN CM will make outreach attempt to patient within the month of July.    Enzo Montgomery, RN,BSN,CCM [place] Management Telephonic Care Management Coordinator Direct Phone: [phone] Toll Free: [phone] Fax: [phone]

## 2019-12-27 DIAGNOSIS — D631 Anemia in chronic kidney disease: Secondary | ICD-10-CM | POA: Diagnosis not present

## 2019-12-27 DIAGNOSIS — N186 End stage renal disease: Secondary | ICD-10-CM | POA: Diagnosis not present

## 2019-12-27 DIAGNOSIS — D509 Iron deficiency anemia, unspecified: Secondary | ICD-10-CM | POA: Diagnosis not present

## 2019-12-28 DIAGNOSIS — E1151 Type 2 diabetes mellitus with diabetic peripheral angiopathy without gangrene: Secondary | ICD-10-CM | POA: Diagnosis not present

## 2019-12-28 DIAGNOSIS — D631 Anemia in chronic kidney disease: Secondary | ICD-10-CM | POA: Diagnosis not present

## 2019-12-28 DIAGNOSIS — I252 Old myocardial infarction: Secondary | ICD-10-CM | POA: Diagnosis not present

## 2019-12-28 DIAGNOSIS — E1122 Type 2 diabetes mellitus with diabetic chronic kidney disease: Secondary | ICD-10-CM | POA: Diagnosis not present

## 2019-12-28 DIAGNOSIS — M109 Gout, unspecified: Secondary | ICD-10-CM | POA: Diagnosis not present

## 2019-12-28 DIAGNOSIS — N186 End stage renal disease: Secondary | ICD-10-CM | POA: Diagnosis not present

## 2019-12-28 DIAGNOSIS — I5042 Chronic combined systolic (congestive) and diastolic (congestive) heart failure: Secondary | ICD-10-CM | POA: Diagnosis not present

## 2019-12-28 DIAGNOSIS — I132 Hypertensive heart and chronic kidney disease with heart failure and with stage 5 chronic kidney disease, or end stage renal disease: Secondary | ICD-10-CM | POA: Diagnosis not present

## 2019-12-28 DIAGNOSIS — I2510 Atherosclerotic heart disease of native coronary artery without angina pectoris: Secondary | ICD-10-CM | POA: Diagnosis not present

## 2019-12-29 DIAGNOSIS — N186 End stage renal disease: Secondary | ICD-10-CM | POA: Diagnosis not present

## 2019-12-29 DIAGNOSIS — D509 Iron deficiency anemia, unspecified: Secondary | ICD-10-CM | POA: Diagnosis not present

## 2019-12-29 DIAGNOSIS — D631 Anemia in chronic kidney disease: Secondary | ICD-10-CM | POA: Diagnosis not present

## 2019-12-31 DIAGNOSIS — N186 End stage renal disease: Secondary | ICD-10-CM | POA: Diagnosis not present

## 2019-12-31 DIAGNOSIS — D631 Anemia in chronic kidney disease: Secondary | ICD-10-CM | POA: Diagnosis not present

## 2019-12-31 DIAGNOSIS — D509 Iron deficiency anemia, unspecified: Secondary | ICD-10-CM | POA: Diagnosis not present

## 2020-01-02 ENCOUNTER — Other Ambulatory Visit: Payer: Self-pay

## 2020-01-02 DIAGNOSIS — I132 Hypertensive heart and chronic kidney disease with heart failure and with stage 5 chronic kidney disease, or end stage renal disease: Secondary | ICD-10-CM | POA: Diagnosis not present

## 2020-01-02 DIAGNOSIS — D631 Anemia in chronic kidney disease: Secondary | ICD-10-CM | POA: Diagnosis not present

## 2020-01-02 DIAGNOSIS — E1151 Type 2 diabetes mellitus with diabetic peripheral angiopathy without gangrene: Secondary | ICD-10-CM | POA: Diagnosis not present

## 2020-01-02 DIAGNOSIS — M109 Gout, unspecified: Secondary | ICD-10-CM | POA: Diagnosis not present

## 2020-01-02 DIAGNOSIS — E1122 Type 2 diabetes mellitus with diabetic chronic kidney disease: Secondary | ICD-10-CM | POA: Diagnosis not present

## 2020-01-02 DIAGNOSIS — N186 End stage renal disease: Secondary | ICD-10-CM | POA: Diagnosis not present

## 2020-01-02 DIAGNOSIS — I5042 Chronic combined systolic (congestive) and diastolic (congestive) heart failure: Secondary | ICD-10-CM | POA: Diagnosis not present

## 2020-01-02 DIAGNOSIS — I252 Old myocardial infarction: Secondary | ICD-10-CM | POA: Diagnosis not present

## 2020-01-02 DIAGNOSIS — I2510 Atherosclerotic heart disease of native coronary artery without angina pectoris: Secondary | ICD-10-CM | POA: Diagnosis not present

## 2020-01-02 NOTE — Patient Outreach (Signed)
[facility]) Care Management  01/02/2020  Monica [name] 30-Mar-1931 [id number]   Telephone Assessment    Outreach attempt to patient. Line busy.      Plan: RN CM will make outreach attempt to patient within the month of July.   Enzo Montgomery, RN,BSN,CCM [place] Management Telephonic Care Management Coordinator Direct Phone: [phone] Toll Free: [phone] Fax: [phone]

## 2020-01-03 DIAGNOSIS — N186 End stage renal disease: Secondary | ICD-10-CM | POA: Diagnosis not present

## 2020-01-03 DIAGNOSIS — D509 Iron deficiency anemia, unspecified: Secondary | ICD-10-CM | POA: Diagnosis not present

## 2020-01-03 DIAGNOSIS — D631 Anemia in chronic kidney disease: Secondary | ICD-10-CM | POA: Diagnosis not present

## 2020-01-04 DIAGNOSIS — Z992 Dependence on renal dialysis: Secondary | ICD-10-CM | POA: Diagnosis not present

## 2020-01-04 DIAGNOSIS — N186 End stage renal disease: Secondary | ICD-10-CM | POA: Diagnosis not present

## 2020-01-05 DIAGNOSIS — R079 Chest pain, unspecified: Secondary | ICD-10-CM | POA: Diagnosis not present

## 2020-01-05 DIAGNOSIS — N2581 Secondary hyperparathyroidism of renal origin: Secondary | ICD-10-CM | POA: Diagnosis not present

## 2020-01-05 DIAGNOSIS — E8779 Other fluid overload: Secondary | ICD-10-CM | POA: Diagnosis not present

## 2020-01-05 DIAGNOSIS — D631 Anemia in chronic kidney disease: Secondary | ICD-10-CM | POA: Diagnosis not present

## 2020-01-05 DIAGNOSIS — N186 End stage renal disease: Secondary | ICD-10-CM | POA: Diagnosis not present

## 2020-01-05 DIAGNOSIS — E785 Hyperlipidemia, unspecified: Secondary | ICD-10-CM | POA: Diagnosis not present

## 2020-01-07 DIAGNOSIS — N2581 Secondary hyperparathyroidism of renal origin: Secondary | ICD-10-CM | POA: Diagnosis not present

## 2020-01-07 DIAGNOSIS — N186 End stage renal disease: Secondary | ICD-10-CM | POA: Diagnosis not present

## 2020-01-07 DIAGNOSIS — R079 Chest pain, unspecified: Secondary | ICD-10-CM | POA: Diagnosis not present

## 2020-01-07 DIAGNOSIS — E785 Hyperlipidemia, unspecified: Secondary | ICD-10-CM | POA: Diagnosis not present

## 2020-01-07 DIAGNOSIS — E8779 Other fluid overload: Secondary | ICD-10-CM | POA: Diagnosis not present

## 2020-01-07 DIAGNOSIS — D631 Anemia in chronic kidney disease: Secondary | ICD-10-CM | POA: Diagnosis not present

## 2020-01-10 DIAGNOSIS — I252 Old myocardial infarction: Secondary | ICD-10-CM | POA: Diagnosis not present

## 2020-01-10 DIAGNOSIS — I132 Hypertensive heart and chronic kidney disease with heart failure and with stage 5 chronic kidney disease, or end stage renal disease: Secondary | ICD-10-CM | POA: Diagnosis not present

## 2020-01-10 DIAGNOSIS — E785 Hyperlipidemia, unspecified: Secondary | ICD-10-CM | POA: Diagnosis not present

## 2020-01-10 DIAGNOSIS — D631 Anemia in chronic kidney disease: Secondary | ICD-10-CM | POA: Diagnosis not present

## 2020-01-10 DIAGNOSIS — N186 End stage renal disease: Secondary | ICD-10-CM | POA: Diagnosis not present

## 2020-01-10 DIAGNOSIS — I2510 Atherosclerotic heart disease of native coronary artery without angina pectoris: Secondary | ICD-10-CM | POA: Diagnosis not present

## 2020-01-10 DIAGNOSIS — E8779 Other fluid overload: Secondary | ICD-10-CM | POA: Diagnosis not present

## 2020-01-10 DIAGNOSIS — R079 Chest pain, unspecified: Secondary | ICD-10-CM | POA: Diagnosis not present

## 2020-01-10 DIAGNOSIS — N2581 Secondary hyperparathyroidism of renal origin: Secondary | ICD-10-CM | POA: Diagnosis not present

## 2020-01-10 DIAGNOSIS — E1151 Type 2 diabetes mellitus with diabetic peripheral angiopathy without gangrene: Secondary | ICD-10-CM | POA: Diagnosis not present

## 2020-01-10 DIAGNOSIS — E1122 Type 2 diabetes mellitus with diabetic chronic kidney disease: Secondary | ICD-10-CM | POA: Diagnosis not present

## 2020-01-10 DIAGNOSIS — M109 Gout, unspecified: Secondary | ICD-10-CM | POA: Diagnosis not present

## 2020-01-10 DIAGNOSIS — I5042 Chronic combined systolic (congestive) and diastolic (congestive) heart failure: Secondary | ICD-10-CM | POA: Diagnosis not present

## 2020-01-12 DIAGNOSIS — D631 Anemia in chronic kidney disease: Secondary | ICD-10-CM | POA: Diagnosis not present

## 2020-01-12 DIAGNOSIS — N2581 Secondary hyperparathyroidism of renal origin: Secondary | ICD-10-CM | POA: Diagnosis not present

## 2020-01-12 DIAGNOSIS — E8779 Other fluid overload: Secondary | ICD-10-CM | POA: Diagnosis not present

## 2020-01-12 DIAGNOSIS — R079 Chest pain, unspecified: Secondary | ICD-10-CM | POA: Diagnosis not present

## 2020-01-12 DIAGNOSIS — N186 End stage renal disease: Secondary | ICD-10-CM | POA: Diagnosis not present

## 2020-01-12 DIAGNOSIS — E119 Type 2 diabetes mellitus without complications: Secondary | ICD-10-CM | POA: Diagnosis not present

## 2020-01-12 DIAGNOSIS — M109 Gout, unspecified: Secondary | ICD-10-CM | POA: Diagnosis not present

## 2020-01-12 DIAGNOSIS — E785 Hyperlipidemia, unspecified: Secondary | ICD-10-CM | POA: Diagnosis not present

## 2020-01-14 DIAGNOSIS — E785 Hyperlipidemia, unspecified: Secondary | ICD-10-CM | POA: Diagnosis not present

## 2020-01-14 DIAGNOSIS — N2581 Secondary hyperparathyroidism of renal origin: Secondary | ICD-10-CM | POA: Diagnosis not present

## 2020-01-14 DIAGNOSIS — N186 End stage renal disease: Secondary | ICD-10-CM | POA: Diagnosis not present

## 2020-01-14 DIAGNOSIS — E8779 Other fluid overload: Secondary | ICD-10-CM | POA: Diagnosis not present

## 2020-01-14 DIAGNOSIS — D631 Anemia in chronic kidney disease: Secondary | ICD-10-CM | POA: Diagnosis not present

## 2020-01-14 DIAGNOSIS — R079 Chest pain, unspecified: Secondary | ICD-10-CM | POA: Diagnosis not present

## 2020-01-16 DIAGNOSIS — I10 Essential (primary) hypertension: Secondary | ICD-10-CM | POA: Diagnosis not present

## 2020-01-16 DIAGNOSIS — I5043 Acute on chronic combined systolic (congestive) and diastolic (congestive) heart failure: Secondary | ICD-10-CM | POA: Diagnosis not present

## 2020-01-16 DIAGNOSIS — J45909 Unspecified asthma, uncomplicated: Secondary | ICD-10-CM | POA: Diagnosis not present

## 2020-01-16 DIAGNOSIS — E1165 Type 2 diabetes mellitus with hyperglycemia: Secondary | ICD-10-CM | POA: Diagnosis not present

## 2020-01-16 DIAGNOSIS — N186 End stage renal disease: Secondary | ICD-10-CM | POA: Diagnosis not present

## 2020-01-16 DIAGNOSIS — E785 Hyperlipidemia, unspecified: Secondary | ICD-10-CM | POA: Diagnosis not present

## 2020-01-17 DIAGNOSIS — N2581 Secondary hyperparathyroidism of renal origin: Secondary | ICD-10-CM | POA: Diagnosis not present

## 2020-01-17 DIAGNOSIS — E785 Hyperlipidemia, unspecified: Secondary | ICD-10-CM | POA: Diagnosis not present

## 2020-01-17 DIAGNOSIS — E8779 Other fluid overload: Secondary | ICD-10-CM | POA: Diagnosis not present

## 2020-01-17 DIAGNOSIS — N186 End stage renal disease: Secondary | ICD-10-CM | POA: Diagnosis not present

## 2020-01-17 DIAGNOSIS — R079 Chest pain, unspecified: Secondary | ICD-10-CM | POA: Diagnosis not present

## 2020-01-17 DIAGNOSIS — D631 Anemia in chronic kidney disease: Secondary | ICD-10-CM | POA: Diagnosis not present

## 2020-01-19 DIAGNOSIS — N2581 Secondary hyperparathyroidism of renal origin: Secondary | ICD-10-CM | POA: Diagnosis not present

## 2020-01-19 DIAGNOSIS — E785 Hyperlipidemia, unspecified: Secondary | ICD-10-CM | POA: Diagnosis not present

## 2020-01-19 DIAGNOSIS — D631 Anemia in chronic kidney disease: Secondary | ICD-10-CM | POA: Diagnosis not present

## 2020-01-19 DIAGNOSIS — N186 End stage renal disease: Secondary | ICD-10-CM | POA: Diagnosis not present

## 2020-01-19 DIAGNOSIS — E8779 Other fluid overload: Secondary | ICD-10-CM | POA: Diagnosis not present

## 2020-01-19 DIAGNOSIS — R079 Chest pain, unspecified: Secondary | ICD-10-CM | POA: Diagnosis not present

## 2020-01-21 DIAGNOSIS — N2581 Secondary hyperparathyroidism of renal origin: Secondary | ICD-10-CM | POA: Diagnosis not present

## 2020-01-21 DIAGNOSIS — R079 Chest pain, unspecified: Secondary | ICD-10-CM | POA: Diagnosis not present

## 2020-01-21 DIAGNOSIS — E785 Hyperlipidemia, unspecified: Secondary | ICD-10-CM | POA: Diagnosis not present

## 2020-01-21 DIAGNOSIS — M79662 Pain in left lower leg: Secondary | ICD-10-CM | POA: Diagnosis not present

## 2020-01-21 DIAGNOSIS — N186 End stage renal disease: Secondary | ICD-10-CM | POA: Diagnosis not present

## 2020-01-21 DIAGNOSIS — M79605 Pain in left leg: Secondary | ICD-10-CM | POA: Diagnosis not present

## 2020-01-21 DIAGNOSIS — E8779 Other fluid overload: Secondary | ICD-10-CM | POA: Diagnosis not present

## 2020-01-21 DIAGNOSIS — D631 Anemia in chronic kidney disease: Secondary | ICD-10-CM | POA: Diagnosis not present

## 2020-01-24 DIAGNOSIS — N2581 Secondary hyperparathyroidism of renal origin: Secondary | ICD-10-CM | POA: Diagnosis not present

## 2020-01-24 DIAGNOSIS — E8779 Other fluid overload: Secondary | ICD-10-CM | POA: Diagnosis not present

## 2020-01-24 DIAGNOSIS — E785 Hyperlipidemia, unspecified: Secondary | ICD-10-CM | POA: Diagnosis not present

## 2020-01-24 DIAGNOSIS — R079 Chest pain, unspecified: Secondary | ICD-10-CM | POA: Diagnosis not present

## 2020-01-24 DIAGNOSIS — N186 End stage renal disease: Secondary | ICD-10-CM | POA: Diagnosis not present

## 2020-01-24 DIAGNOSIS — D631 Anemia in chronic kidney disease: Secondary | ICD-10-CM | POA: Diagnosis not present

## 2020-01-26 DIAGNOSIS — D631 Anemia in chronic kidney disease: Secondary | ICD-10-CM | POA: Diagnosis not present

## 2020-01-26 DIAGNOSIS — E8779 Other fluid overload: Secondary | ICD-10-CM | POA: Diagnosis not present

## 2020-01-26 DIAGNOSIS — R079 Chest pain, unspecified: Secondary | ICD-10-CM | POA: Diagnosis not present

## 2020-01-26 DIAGNOSIS — N2581 Secondary hyperparathyroidism of renal origin: Secondary | ICD-10-CM | POA: Diagnosis not present

## 2020-01-26 DIAGNOSIS — E785 Hyperlipidemia, unspecified: Secondary | ICD-10-CM | POA: Diagnosis not present

## 2020-01-26 DIAGNOSIS — N186 End stage renal disease: Secondary | ICD-10-CM | POA: Diagnosis not present

## 2020-01-28 DIAGNOSIS — N2581 Secondary hyperparathyroidism of renal origin: Secondary | ICD-10-CM | POA: Diagnosis not present

## 2020-01-28 DIAGNOSIS — N186 End stage renal disease: Secondary | ICD-10-CM | POA: Diagnosis not present

## 2020-01-28 DIAGNOSIS — E8779 Other fluid overload: Secondary | ICD-10-CM | POA: Diagnosis not present

## 2020-01-28 DIAGNOSIS — E785 Hyperlipidemia, unspecified: Secondary | ICD-10-CM | POA: Diagnosis not present

## 2020-01-28 DIAGNOSIS — D631 Anemia in chronic kidney disease: Secondary | ICD-10-CM | POA: Diagnosis not present

## 2020-01-28 DIAGNOSIS — R079 Chest pain, unspecified: Secondary | ICD-10-CM | POA: Diagnosis not present

## 2020-01-30 ENCOUNTER — Other Ambulatory Visit: Payer: Self-pay

## 2020-01-30 NOTE — Patient Outreach (Signed)
[facility] [place] [facility]) Care Management  01/30/2020  Monica [name] Dec 11, 1930 [id number]   Telephone Assessment Annual Assessment   Outreach attempt # 1 to patient. Spoke with patient who denies any acute issues at present. She was in the ED on 01/21/20 for leg pain following dialysis treatment. Patient reports she was ruled out for having blood clot and tests in ED were negative. She reports that she continues to have the pain on occassions but only after dialysis. She has not discussed this with MD at dialysis center. RN CM instructed patient to alert MD tomorrow during treatment for further eval. She is unsure of when next PCP appt is. Her children normally help her manage that. She voices that she is taking meds as prescribed and no issues regarding them. Appetite remains good. She voices that her wgt in between dialysis sessions are only fluctuating about 2-3 pounds. Patient states that her last episode of chest pain was about a week and a half ago. She reports that her grandson was killed and it was "hard on the family." Condolences and support given to patient. She report that stress is normally the trigger of her chest pain events but has reports she has had no further episodes. She denies any RN CM needs or concerns at present.    Medications Reviewed Today    Reviewed by Hayden Pedro, RN (Registered Nurse) on 01/30/20 at [phone]  Med List Status: <None>  Medication Order Taking? Sig Documenting Provider Last Dose Status Informant  acetaminophen (TYLENOL) 500 MG tablet [id number] No Take 1 tablet (500 mg total) by mouth every 6 (six) hours as needed for mild pain or headache. Eugenie Filler, MD Taking Active Family Member  albuterol (PROVENTIL HFA;VENTOLIN HFA) 108 [phone] Base) MCG/ACT inhaler [id number] No Inhale 2 puffs into the lungs every 6 (six) hours as needed for wheezing or shortness of breath. [provider] Taking Active Family Member  allopurinol  (ZYLOPRIM) 100 MG tablet [id number] No Take 100 mg by mouth 2 (two) times daily.  [provider] Taking Active Family Member  aspirin EC 81 MG EC tablet [id number] No Take 1 tablet (81 mg total) by mouth daily. Barrett, Lodema Hong, PA-C Taking Active Family Member  atorvastatin (LIPITOR) 80 MG tablet [id number] No Take 80 mg by mouth daily at 6 PM.  [provider] Taking Active Family Member  brimonidine (ALPHAGAN P) 0.1 % SOLN [id number] No Place 1 drop into both eyes 2 (two) times daily. [provider] Taking Active Family Member  Cholecalciferol (VITAMIN D-3) 1000 units CAPS [id number] No Take 1,000 Units by mouth daily.  [provider] Taking Active Family Member  clopidogrel (PLAVIX) 75 MG tablet [id number] No Take 1 tablet (75 mg total) by mouth daily. Charlie Pitter, PA-C Taking Active   Cranberry 500 MG CAPS [id number] No Take 1 capsule by mouth daily. [provider] Taking Active Family Member  diphenhydrAMINE (BENADRYL) 25 mg capsule [id number] No Take 25 mg by mouth as needed. [provider] Taking Active Family Member  ferric citrate (AURYXIA) 1 GM 210 MG(Fe) tablet [id number] No Take 210 mg by mouth 3 (three) times daily with meals.  [provider] Taking Active Family Member  Insulin Degludec [facility]) [id number] No Inject 20 Units into the skin daily. Per patient taking 12 units [provider] Taking Active Family Member  isosorbide mononitrate (IMDUR) 60 MG 24 hr tablet [id number] No Take 1 tablet (60 mg total) by mouth 2 (two)  times daily. Charlie Pitter, PA-C Taking Active   latanoprost (XALATAN) 0.005 % ophthalmic solution [id number] No Place 1 drop into both eyes at bedtime. [provider] Taking Active Family Member  loratadine (CLARITIN) 10 MG tablet [id number] No Take 10 mg by mouth as needed for allergies.  [provider] Taking Active Family Member  Melatonin 5 MG CAPS [id number] No Take  5 mg by mouth at bedtime.  [provider] Taking Active Family Member  multivitamin (RENA-VIT) TABS tablet [id number] No Take 1 tablet by mouth daily. [provider] Taking Active Family Member  nitroGLYCERIN (NITROSTAT) 0.4 MG SL tablet [id number] No Place 1 tablet (0.4 mg total) under the tongue every 5 (five) minutes as needed for chest pain. Barrett, Evelene Croon, PA-C Taking Active Family Member  pantoprazole (PROTONIX) 40 MG tablet [id number] No Take 1 tablet (40 mg total) by mouth 2 (two) times daily. Isaiah Serge, NP Taking Active   Probiotic Product (PROBIOTIC PO) [id number] No Take 1 tablet by mouth daily. 50 billion cfus [provider] Taking Active Family Member  Med List Note Dina Rich, Darden Dates, CPhT 03/17/19 1056): Dialysis  Tuesday, Thursday and saturday          Depression screen [facility] 2/9 01/30/2020 01/26/2019 01/12/2019  Decreased Interest 0 0 0  Down, Depressed, Hopeless 0 0 0  PHQ - 2 Score 0 0 0   Fall Risk  01/30/2020 11/23/2019 10/21/2019 07/15/2019 04/22/2019  Falls in the past year? '1 1 1 '$ 0 0  Number falls in past yr: 0 0 0 0 0  Injury with Fall? '1 1 1 '$ 0 0  Risk for fall due to : History of fall(s);Impaired balance/gait;Impaired mobility;Impaired vision;Medication side effect History of fall(s);Impaired balance/gait;Impaired mobility;Impaired vision;Medication side effect History of fall(s);Impaired balance/gait;Impaired mobility;Medication side effect;Impaired vision Impaired balance/gait;Impaired mobility;Medication side effect Impaired balance/gait;Impaired mobility;Medication side effect;Impaired vision  Follow up Education provided;Falls evaluation completed Education provided;Falls evaluation completed Falls evaluation completed;Education provided;Falls prevention discussed - Falls evaluation completed;Education provided   SDOH Screenings   Alcohol Screen:    Last Alcohol Screening Score (AUDIT):   Depression (PHQ2-9): Low Risk    PHQ-2  Score: 0  Financial Resource Strain:    Difficulty of Paying Living Expenses:   Food Insecurity:    Worried About Charity fundraiser in the Last Year:    Arboriculturist in the Last Year:   Housing: Low Risk    Last Housing Risk Score: 0  Physical Activity:    Days of Exercise per Week:    Minutes of Exercise per Session:   Social Connections:    Frequency of Communication with Friends and Family:    Frequency of Social Gatherings with Friends and Family:    Attends Religious Services:    Active Member of Clubs or Organizations:    Attends Music therapist:    Marital Status:   Stress:    Feeling of Stress :   Tobacco Use: Medium Risk   Smoking Tobacco Use: Never Smoker   Smokeless Tobacco Use: Former Soil scientist Needs: No Data processing manager (Medical): No   Lack of Transportation (Non-Medical): No   THN CM Care Plan Problem One     Most Recent Value  Care Plan Problem One Patient with impaired cardiac status/functioning  Role Documenting the Problem One Care Management Telephonic Coordinator  Care Plan for Problem One Active  THN CM Short Term Goal #1  Patient will report  no CP or other cardiac sxs over the next 30 days.  THN CM Short Term Goal #1 Start Date 07/15/19  Interventions for Short Term Goal #1 RN CM assessed for recent CP events and triggers. RNCM reviewed action plan and med mgmt with pt.  THN CM Short Term Goal #2  Patient will report maintaining wgt within 2-3 lbs of HD target wgt over the next 30 days  THN CM Short Term Goal #2 Start Date 09/14/19  [facility] CM Short Term Goal #2 Met Date 01/30/20    [facility] CM Care Plan Problem Two     Most Recent Value  Care Plan Problem Two Patient with weakness and decconditioning.  Role Documenting the Problem Two Care Management [place] for Problem Two Active  THN Long Term Goal Patient will report completing HHPT over the next 60 days.   THN Long Term Goal Start Date 07/15/19  [facility] Long Term Goal Met Date 01/30/20     Plan: RN CM discussed with patient next outreach within the month of  Sept. Patient gave verbal consent and in agreement with RN CM follow up and timeframe. Patient aware that they may contact RN CM sooner for any issues or concerns. RN CM will send quarterly update to PCP.   Enzo Montgomery, RN,BSN,CCM [place] Management Telephonic Care Management Coordinator Direct Phone: [phone] Toll Free: [phone] Fax: [phone]

## 2020-01-31 ENCOUNTER — Telehealth: Payer: Self-pay | Admitting: Cardiology

## 2020-01-31 DIAGNOSIS — D631 Anemia in chronic kidney disease: Secondary | ICD-10-CM | POA: Diagnosis not present

## 2020-01-31 DIAGNOSIS — Z992 Dependence on renal dialysis: Secondary | ICD-10-CM | POA: Diagnosis not present

## 2020-01-31 DIAGNOSIS — I454 Nonspecific intraventricular block: Secondary | ICD-10-CM | POA: Diagnosis not present

## 2020-01-31 DIAGNOSIS — I4581 Long QT syndrome: Secondary | ICD-10-CM | POA: Diagnosis not present

## 2020-01-31 DIAGNOSIS — R000 Tachycardia, unspecified: Secondary | ICD-10-CM | POA: Diagnosis not present

## 2020-01-31 DIAGNOSIS — E8779 Other fluid overload: Secondary | ICD-10-CM | POA: Diagnosis not present

## 2020-01-31 DIAGNOSIS — R0789 Other chest pain: Secondary | ICD-10-CM | POA: Diagnosis not present

## 2020-01-31 DIAGNOSIS — I120 Hypertensive chronic kidney disease with stage 5 chronic kidney disease or end stage renal disease: Secondary | ICD-10-CM | POA: Diagnosis not present

## 2020-01-31 DIAGNOSIS — E785 Hyperlipidemia, unspecified: Secondary | ICD-10-CM | POA: Diagnosis not present

## 2020-01-31 DIAGNOSIS — N2581 Secondary hyperparathyroidism of renal origin: Secondary | ICD-10-CM | POA: Diagnosis not present

## 2020-01-31 DIAGNOSIS — I10 Essential (primary) hypertension: Secondary | ICD-10-CM | POA: Diagnosis not present

## 2020-01-31 DIAGNOSIS — J811 Chronic pulmonary edema: Secondary | ICD-10-CM | POA: Diagnosis not present

## 2020-01-31 DIAGNOSIS — N186 End stage renal disease: Secondary | ICD-10-CM | POA: Diagnosis not present

## 2020-01-31 DIAGNOSIS — J984 Other disorders of lung: Secondary | ICD-10-CM | POA: Diagnosis not present

## 2020-01-31 DIAGNOSIS — I517 Cardiomegaly: Secondary | ICD-10-CM | POA: Diagnosis not present

## 2020-01-31 DIAGNOSIS — R079 Chest pain, unspecified: Secondary | ICD-10-CM | POA: Diagnosis not present

## 2020-01-31 NOTE — Telephone Encounter (Signed)
Pts daughter also sent a mychart message in reference to this.  I sent her a message back informing her that we should not delay the pts care, and get her in to see an Extender in the office.  Offered her to allow [place] to schedule the pt to see Richardson Dopp PA-C for 8/18.  This is a day the pt does not have to report to dialysis.  Advised the pts daughter to message me back if that day will work, and we can schedule from there.  Also advised the pts daughter that according to hospital discharge note from [facility], they advised the pt to follow-up with her PCP in 1-2 weeks, post-hospital.  Advised her daughter that she should reach out to them as well and arrange an appt.  Will await the pts daughter to message back.

## 2020-01-31 NOTE — Telephone Encounter (Signed)
Monica [name] is calling to schedule a hospital f/u with Dr. [name] within the next few days. I advised Monica [name] Dr. [name] does not have anything available before November and the first available with a PA is 08/31 with [name]. Due to this Monica [name] requested a telephone note be sent for Monica [name] to be worked in. Please advise.

## 2020-01-31 NOTE — Telephone Encounter (Signed)
Daughter messaged back and confirmed appt date is good with Richardson Dopp PA-C on 8/18 at 1145.  She is aware to have pt arrive 15 mins prior to this appt.  Daughter agreed to call PCP today, to arrange the pts 1-2 week post-hospital follow-up appt with them, as advised by [facility], at discharge. Daughter mycharted understanding and agreed with this plan.  She was appreciative for all the assistance provided.

## 2020-02-02 DIAGNOSIS — R079 Chest pain, unspecified: Secondary | ICD-10-CM | POA: Diagnosis not present

## 2020-02-02 DIAGNOSIS — N186 End stage renal disease: Secondary | ICD-10-CM | POA: Diagnosis not present

## 2020-02-02 DIAGNOSIS — N2581 Secondary hyperparathyroidism of renal origin: Secondary | ICD-10-CM | POA: Diagnosis not present

## 2020-02-02 DIAGNOSIS — D631 Anemia in chronic kidney disease: Secondary | ICD-10-CM | POA: Diagnosis not present

## 2020-02-02 DIAGNOSIS — E8779 Other fluid overload: Secondary | ICD-10-CM | POA: Diagnosis not present

## 2020-02-02 DIAGNOSIS — E785 Hyperlipidemia, unspecified: Secondary | ICD-10-CM | POA: Diagnosis not present

## 2020-02-03 DIAGNOSIS — R079 Chest pain, unspecified: Secondary | ICD-10-CM | POA: Diagnosis not present

## 2020-02-03 DIAGNOSIS — D631 Anemia in chronic kidney disease: Secondary | ICD-10-CM | POA: Diagnosis not present

## 2020-02-03 DIAGNOSIS — E8779 Other fluid overload: Secondary | ICD-10-CM | POA: Diagnosis not present

## 2020-02-03 DIAGNOSIS — N2581 Secondary hyperparathyroidism of renal origin: Secondary | ICD-10-CM | POA: Diagnosis not present

## 2020-02-03 DIAGNOSIS — E785 Hyperlipidemia, unspecified: Secondary | ICD-10-CM | POA: Diagnosis not present

## 2020-02-03 DIAGNOSIS — N186 End stage renal disease: Secondary | ICD-10-CM | POA: Diagnosis not present

## 2020-02-04 DIAGNOSIS — G9340 Encephalopathy, unspecified: Secondary | ICD-10-CM | POA: Diagnosis not present

## 2020-02-04 DIAGNOSIS — D72829 Elevated white blood cell count, unspecified: Secondary | ICD-10-CM | POA: Diagnosis not present

## 2020-02-04 DIAGNOSIS — I214 Non-ST elevation (NSTEMI) myocardial infarction: Secondary | ICD-10-CM | POA: Diagnosis not present

## 2020-02-04 DIAGNOSIS — J80 Acute respiratory distress syndrome: Secondary | ICD-10-CM | POA: Diagnosis not present

## 2020-02-04 DIAGNOSIS — Z4682 Encounter for fitting and adjustment of non-vascular catheter: Secondary | ICD-10-CM | POA: Diagnosis not present

## 2020-02-04 DIAGNOSIS — E1122 Type 2 diabetes mellitus with diabetic chronic kidney disease: Secondary | ICD-10-CM | POA: Diagnosis not present

## 2020-02-04 DIAGNOSIS — N185 Chronic kidney disease, stage 5: Secondary | ICD-10-CM | POA: Diagnosis not present

## 2020-02-04 DIAGNOSIS — J9601 Acute respiratory failure with hypoxia: Secondary | ICD-10-CM | POA: Diagnosis not present

## 2020-02-04 DIAGNOSIS — I959 Hypotension, unspecified: Secondary | ICD-10-CM | POA: Diagnosis not present

## 2020-02-04 DIAGNOSIS — R0602 Shortness of breath: Secondary | ICD-10-CM | POA: Diagnosis not present

## 2020-02-04 DIAGNOSIS — I2510 Atherosclerotic heart disease of native coronary artery without angina pectoris: Secondary | ICD-10-CM | POA: Diagnosis not present

## 2020-02-04 DIAGNOSIS — Z515 Encounter for palliative care: Secondary | ICD-10-CM | POA: Diagnosis not present

## 2020-02-04 DIAGNOSIS — D649 Anemia, unspecified: Secondary | ICD-10-CM | POA: Diagnosis not present

## 2020-02-04 DIAGNOSIS — Z955 Presence of coronary angioplasty implant and graft: Secondary | ICD-10-CM | POA: Diagnosis not present

## 2020-02-04 DIAGNOSIS — I21A9 Other myocardial infarction type: Secondary | ICD-10-CM | POA: Diagnosis not present

## 2020-02-04 DIAGNOSIS — E872 Acidosis: Secondary | ICD-10-CM | POA: Diagnosis not present

## 2020-02-04 DIAGNOSIS — Z951 Presence of aortocoronary bypass graft: Secondary | ICD-10-CM | POA: Diagnosis not present

## 2020-02-04 DIAGNOSIS — I4891 Unspecified atrial fibrillation: Secondary | ICD-10-CM | POA: Diagnosis not present

## 2020-02-04 DIAGNOSIS — I519 Heart disease, unspecified: Secondary | ICD-10-CM | POA: Diagnosis not present

## 2020-02-04 DIAGNOSIS — Z9889 Other specified postprocedural states: Secondary | ICD-10-CM | POA: Diagnosis not present

## 2020-02-04 DIAGNOSIS — I469 Cardiac arrest, cause unspecified: Secondary | ICD-10-CM | POA: Diagnosis not present

## 2020-02-04 DIAGNOSIS — I471 Supraventricular tachycardia: Secondary | ICD-10-CM | POA: Diagnosis not present

## 2020-02-04 DIAGNOSIS — R079 Chest pain, unspecified: Secondary | ICD-10-CM | POA: Diagnosis not present

## 2020-02-04 DIAGNOSIS — Z9861 Coronary angioplasty status: Secondary | ICD-10-CM | POA: Diagnosis not present

## 2020-02-04 DIAGNOSIS — I249 Acute ischemic heart disease, unspecified: Secondary | ICD-10-CM | POA: Diagnosis not present

## 2020-02-04 DIAGNOSIS — J9690 Respiratory failure, unspecified, unspecified whether with hypoxia or hypercapnia: Secondary | ICD-10-CM | POA: Diagnosis not present

## 2020-02-04 DIAGNOSIS — Z992 Dependence on renal dialysis: Secondary | ICD-10-CM | POA: Diagnosis not present

## 2020-02-04 DIAGNOSIS — D631 Anemia in chronic kidney disease: Secondary | ICD-10-CM | POA: Diagnosis not present

## 2020-02-04 DIAGNOSIS — I340 Nonrheumatic mitral (valve) insufficiency: Secondary | ICD-10-CM | POA: Diagnosis not present

## 2020-02-04 DIAGNOSIS — I509 Heart failure, unspecified: Secondary | ICD-10-CM | POA: Diagnosis not present

## 2020-02-04 DIAGNOSIS — I25810 Atherosclerosis of coronary artery bypass graft(s) without angina pectoris: Secondary | ICD-10-CM | POA: Diagnosis not present

## 2020-02-04 DIAGNOSIS — E785 Hyperlipidemia, unspecified: Secondary | ICD-10-CM | POA: Diagnosis not present

## 2020-02-04 DIAGNOSIS — I120 Hypertensive chronic kidney disease with stage 5 chronic kidney disease or end stage renal disease: Secondary | ICD-10-CM | POA: Diagnosis not present

## 2020-02-04 DIAGNOSIS — Z66 Do not resuscitate: Secondary | ICD-10-CM | POA: Diagnosis not present

## 2020-02-04 DIAGNOSIS — N186 End stage renal disease: Secondary | ICD-10-CM | POA: Diagnosis not present

## 2020-02-04 DIAGNOSIS — Z781 Physical restraint status: Secondary | ICD-10-CM | POA: Diagnosis not present

## 2020-02-04 DIAGNOSIS — R0789 Other chest pain: Secondary | ICD-10-CM | POA: Diagnosis not present

## 2020-02-04 DIAGNOSIS — R0600 Dyspnea, unspecified: Secondary | ICD-10-CM | POA: Diagnosis not present

## 2020-02-04 DIAGNOSIS — I132 Hypertensive heart and chronic kidney disease with heart failure and with stage 5 chronic kidney disease, or end stage renal disease: Secondary | ICD-10-CM | POA: Diagnosis not present

## 2020-02-04 DIAGNOSIS — T82867A Thrombosis of cardiac prosthetic devices, implants and grafts, initial encounter: Secondary | ICD-10-CM | POA: Diagnosis not present

## 2020-02-04 DIAGNOSIS — I4892 Unspecified atrial flutter: Secondary | ICD-10-CM | POA: Diagnosis not present

## 2020-02-04 DIAGNOSIS — Z8674 Personal history of sudden cardiac arrest: Secondary | ICD-10-CM | POA: Diagnosis not present

## 2020-02-04 DIAGNOSIS — N2581 Secondary hyperparathyroidism of renal origin: Secondary | ICD-10-CM | POA: Diagnosis not present

## 2020-02-04 DIAGNOSIS — R570 Cardiogenic shock: Secondary | ICD-10-CM | POA: Diagnosis not present

## 2020-02-04 DIAGNOSIS — I447 Left bundle-branch block, unspecified: Secondary | ICD-10-CM | POA: Diagnosis not present

## 2020-02-04 DIAGNOSIS — E8779 Other fluid overload: Secondary | ICD-10-CM | POA: Diagnosis not present

## 2020-02-04 DIAGNOSIS — Z9911 Dependence on respirator [ventilator] status: Secondary | ICD-10-CM | POA: Diagnosis not present

## 2020-02-04 DIAGNOSIS — R9431 Abnormal electrocardiogram [ECG] [EKG]: Secondary | ICD-10-CM | POA: Diagnosis not present

## 2020-02-04 DIAGNOSIS — I213 ST elevation (STEMI) myocardial infarction of unspecified site: Secondary | ICD-10-CM | POA: Diagnosis not present

## 2020-02-04 DIAGNOSIS — I499 Cardiac arrhythmia, unspecified: Secondary | ICD-10-CM | POA: Diagnosis not present

## 2020-02-16 DIAGNOSIS — I5043 Acute on chronic combined systolic (congestive) and diastolic (congestive) heart failure: Secondary | ICD-10-CM | POA: Diagnosis not present

## 2020-02-16 DIAGNOSIS — J45909 Unspecified asthma, uncomplicated: Secondary | ICD-10-CM | POA: Diagnosis not present

## 2020-02-22 ENCOUNTER — Ambulatory Visit: Payer: Medicare HMO | Admitting: Physician Assistant

## 2020-03-07 DEATH — deceased

## 2020-03-15 ENCOUNTER — Other Ambulatory Visit: Payer: Self-pay

## 2020-03-15 NOTE — Patient Outreach (Signed)
[facility]) Care Management  03/15/2020  Sherle Mello Mar 15, 1931 [id number]   Case Closure   Patient expired on 03-07-20 while hospitalized.   Plan: RN CM will close case.  Enzo Montgomery, RN,BSN,CCM [place] Management Telephonic Care Management Coordinator Direct Phone: [phone] Toll Free: [phone] Fax: [phone]

## 2020-03-19 ENCOUNTER — Ambulatory Visit: Payer: Medicare HMO

## 2020-06-20 ENCOUNTER — Ambulatory Visit: Payer: Medicare HMO | Admitting: Cardiology

## 2020-08-21 IMAGING — DX CHEST - 2 VIEW
2 series · 2 of 2 positions shown · non-contrast
Comparison: Chest radiograph 06/13/2018

CLINICAL DATA: Patient with generalized chest pain.

EXAM:
CHEST - 2 VIEW

[chest pa]
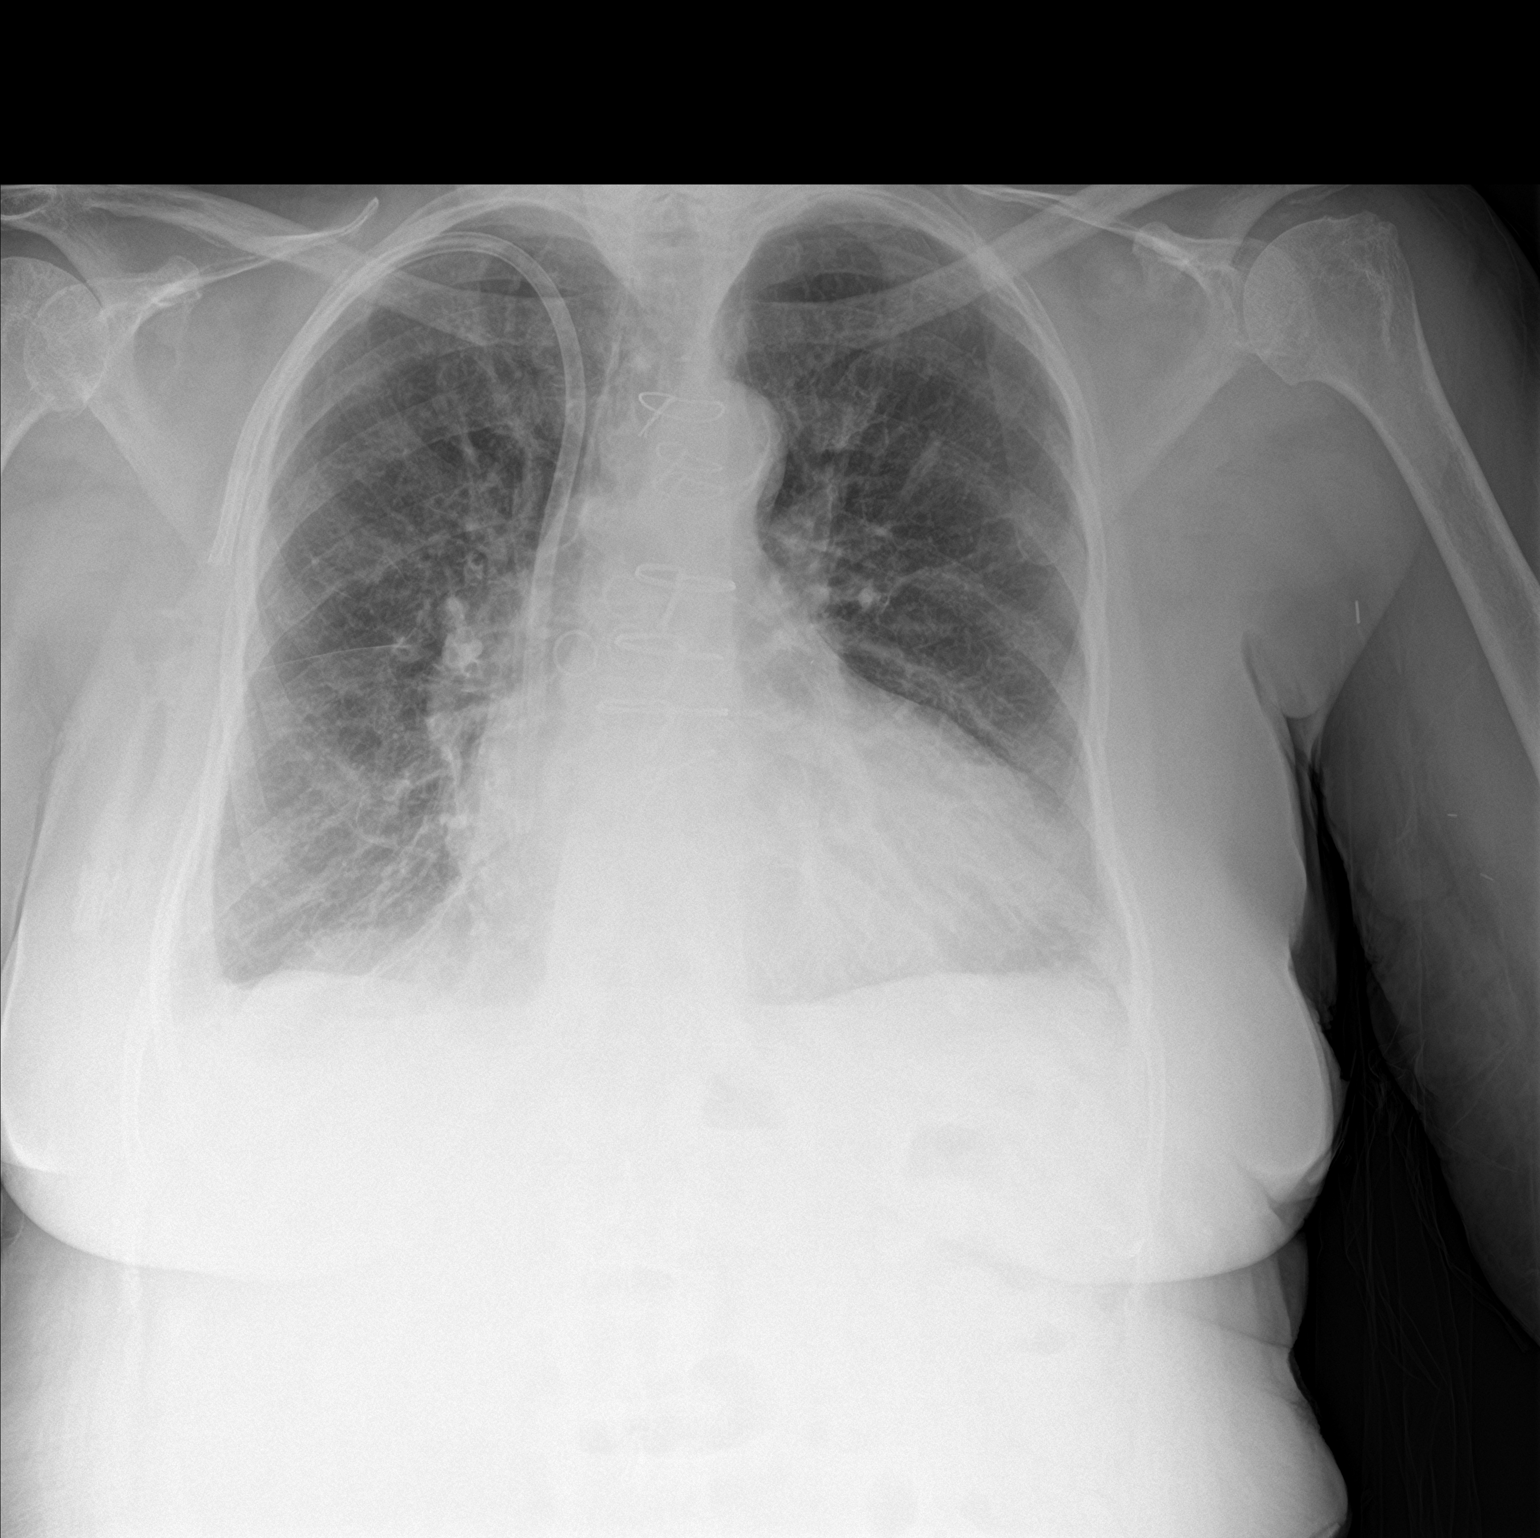

[chest lat]
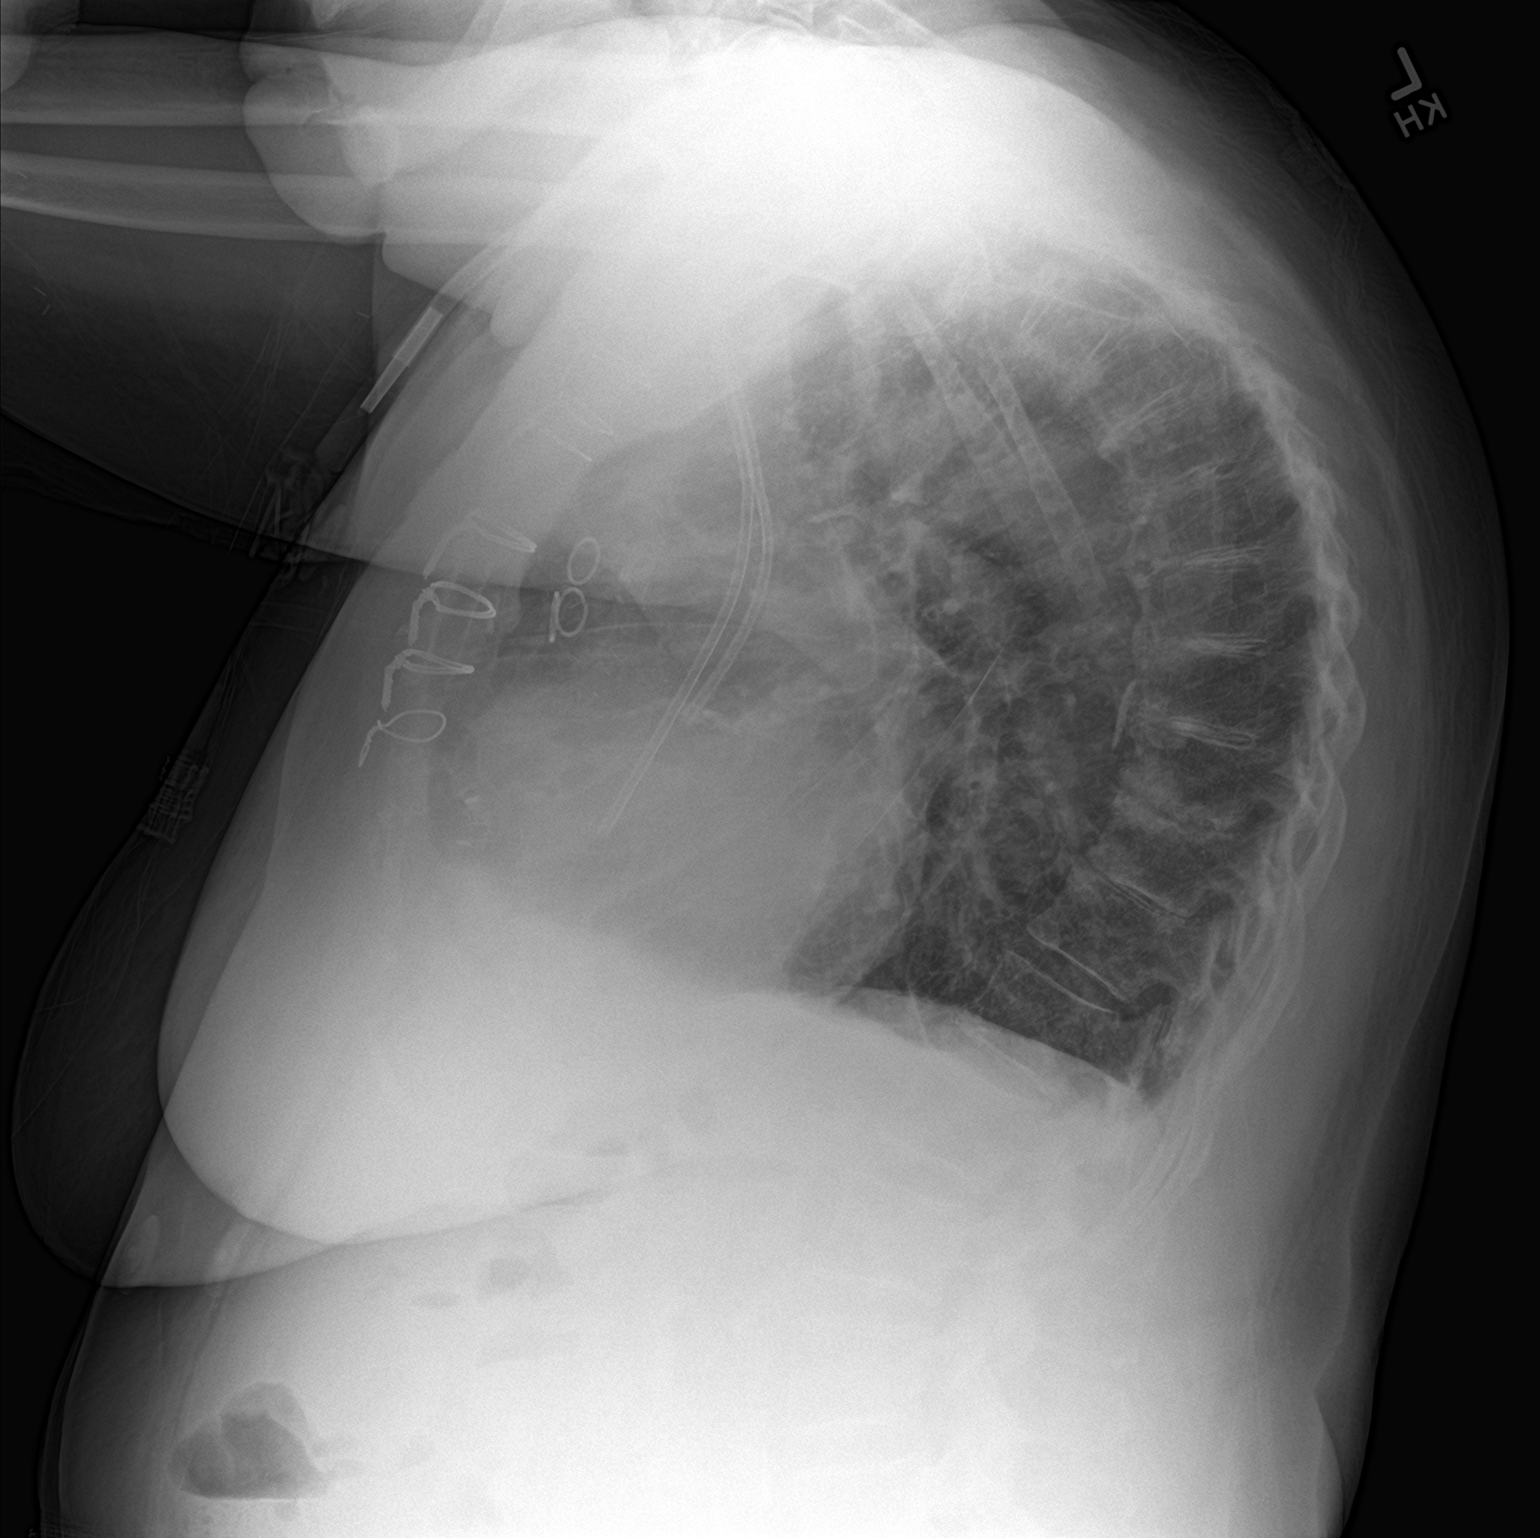

[2 of 2 positions shown; findings below may reference images not displayed]

FINDINGS: Central venous catheter tip projects over the right atrium. Stable
cardiomegaly. Small bilateral pleural effusions. Similar-appearing
bibasilar atelectasis. Similar-appearing interstitial opacities
bilaterally. Thoracic spine degenerative changes.
IMPRESSION: Cardiomegaly.

Small bilateral pleural effusions and underlying atelectasis.

Mild interstitial edema.

## 2020-08-21 IMAGING — DX PORTABLE CHEST - 1 VIEW SAME DAY
1 series · 1 of 1 positions shown · non-contrast
Comparison: Radiographs January 07, 2019.

CLINICAL DATA: Shortness of breath.

EXAM:
PORTABLE CHEST 1 VIEW

[chest ap]
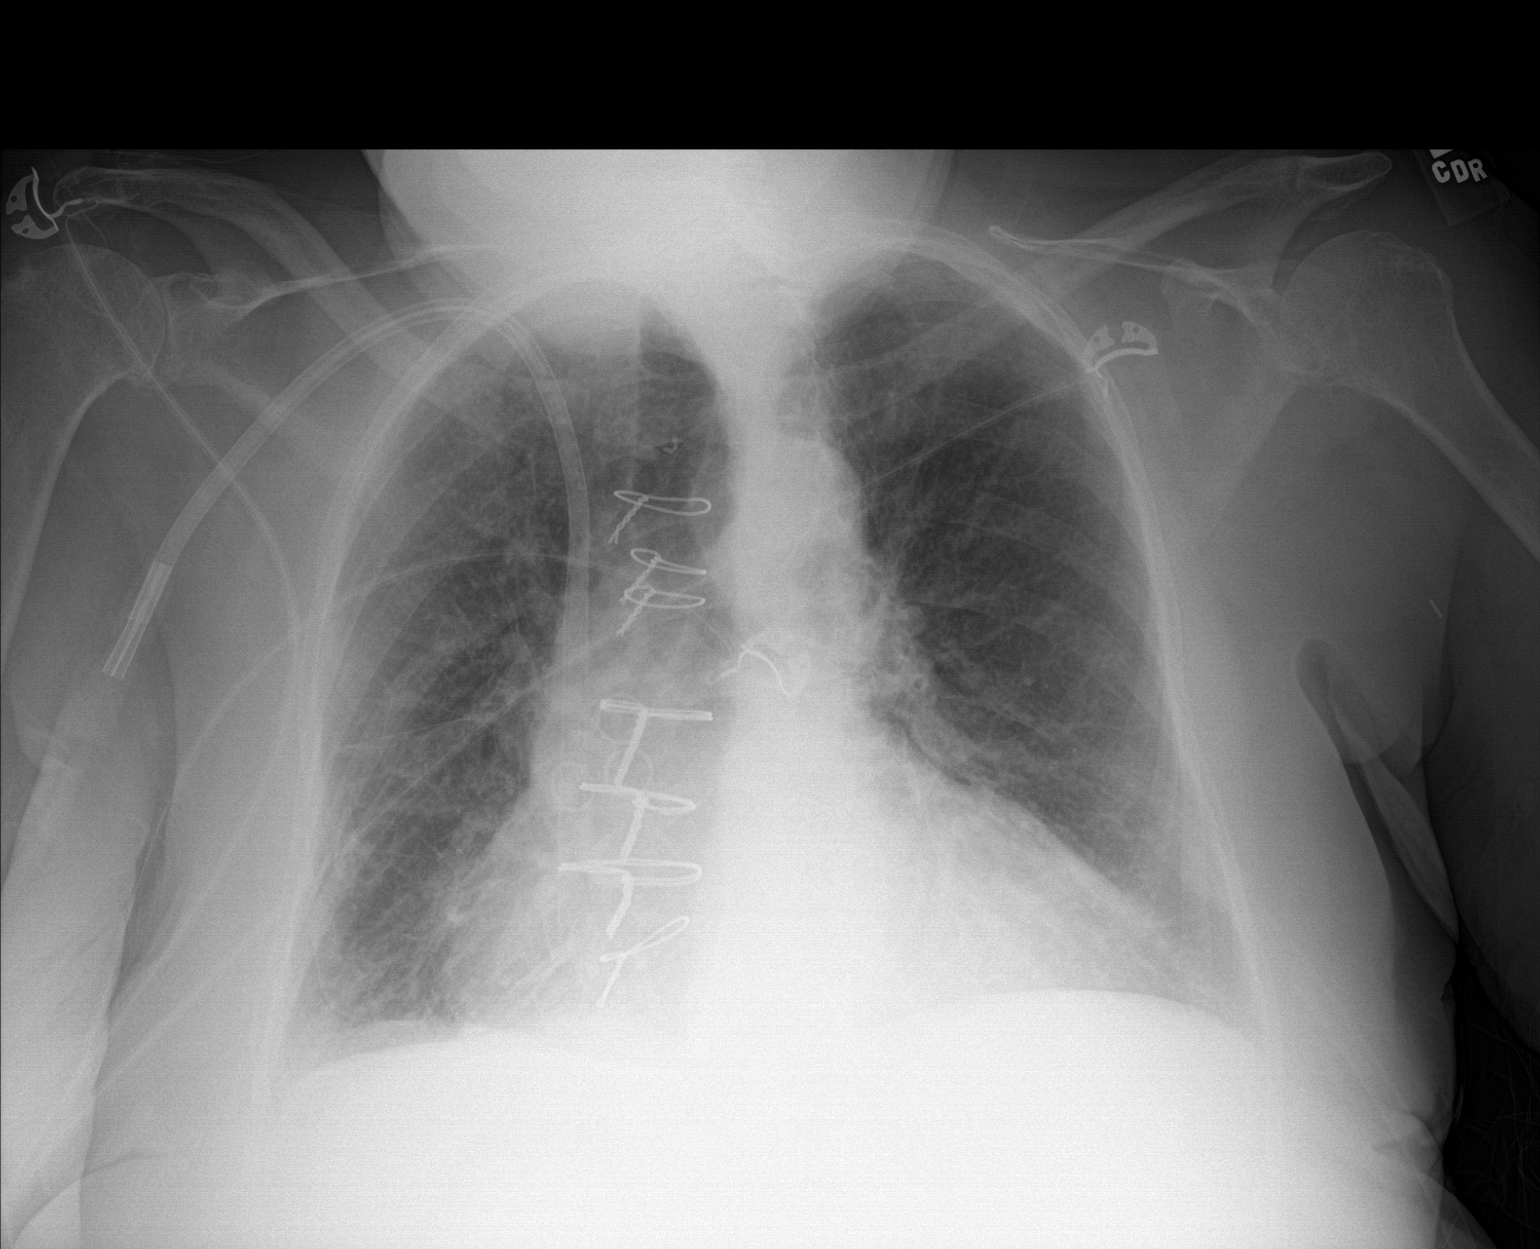

[1 of 1 positions shown; findings below may reference images not displayed]

FINDINGS: Stable cardiomegaly. Status post coronary artery bypass graft. Right
internal jugular dialysis catheter is unchanged in position. No
pneumothorax or pleural effusion is noted. No acute pulmonary
disease is noted. Bony thorax is unremarkable.
IMPRESSION: No active disease.

## 2020-09-24 IMAGING — DX CHEST - 2 VIEW
2 series · 2 of 2 positions shown · non-contrast
Comparison: 01/17/2019 and earlier.

CLINICAL DATA: 88-year-old female with chest pain after dialysis.

EXAM:
CHEST - 2 VIEW

[chest pa]
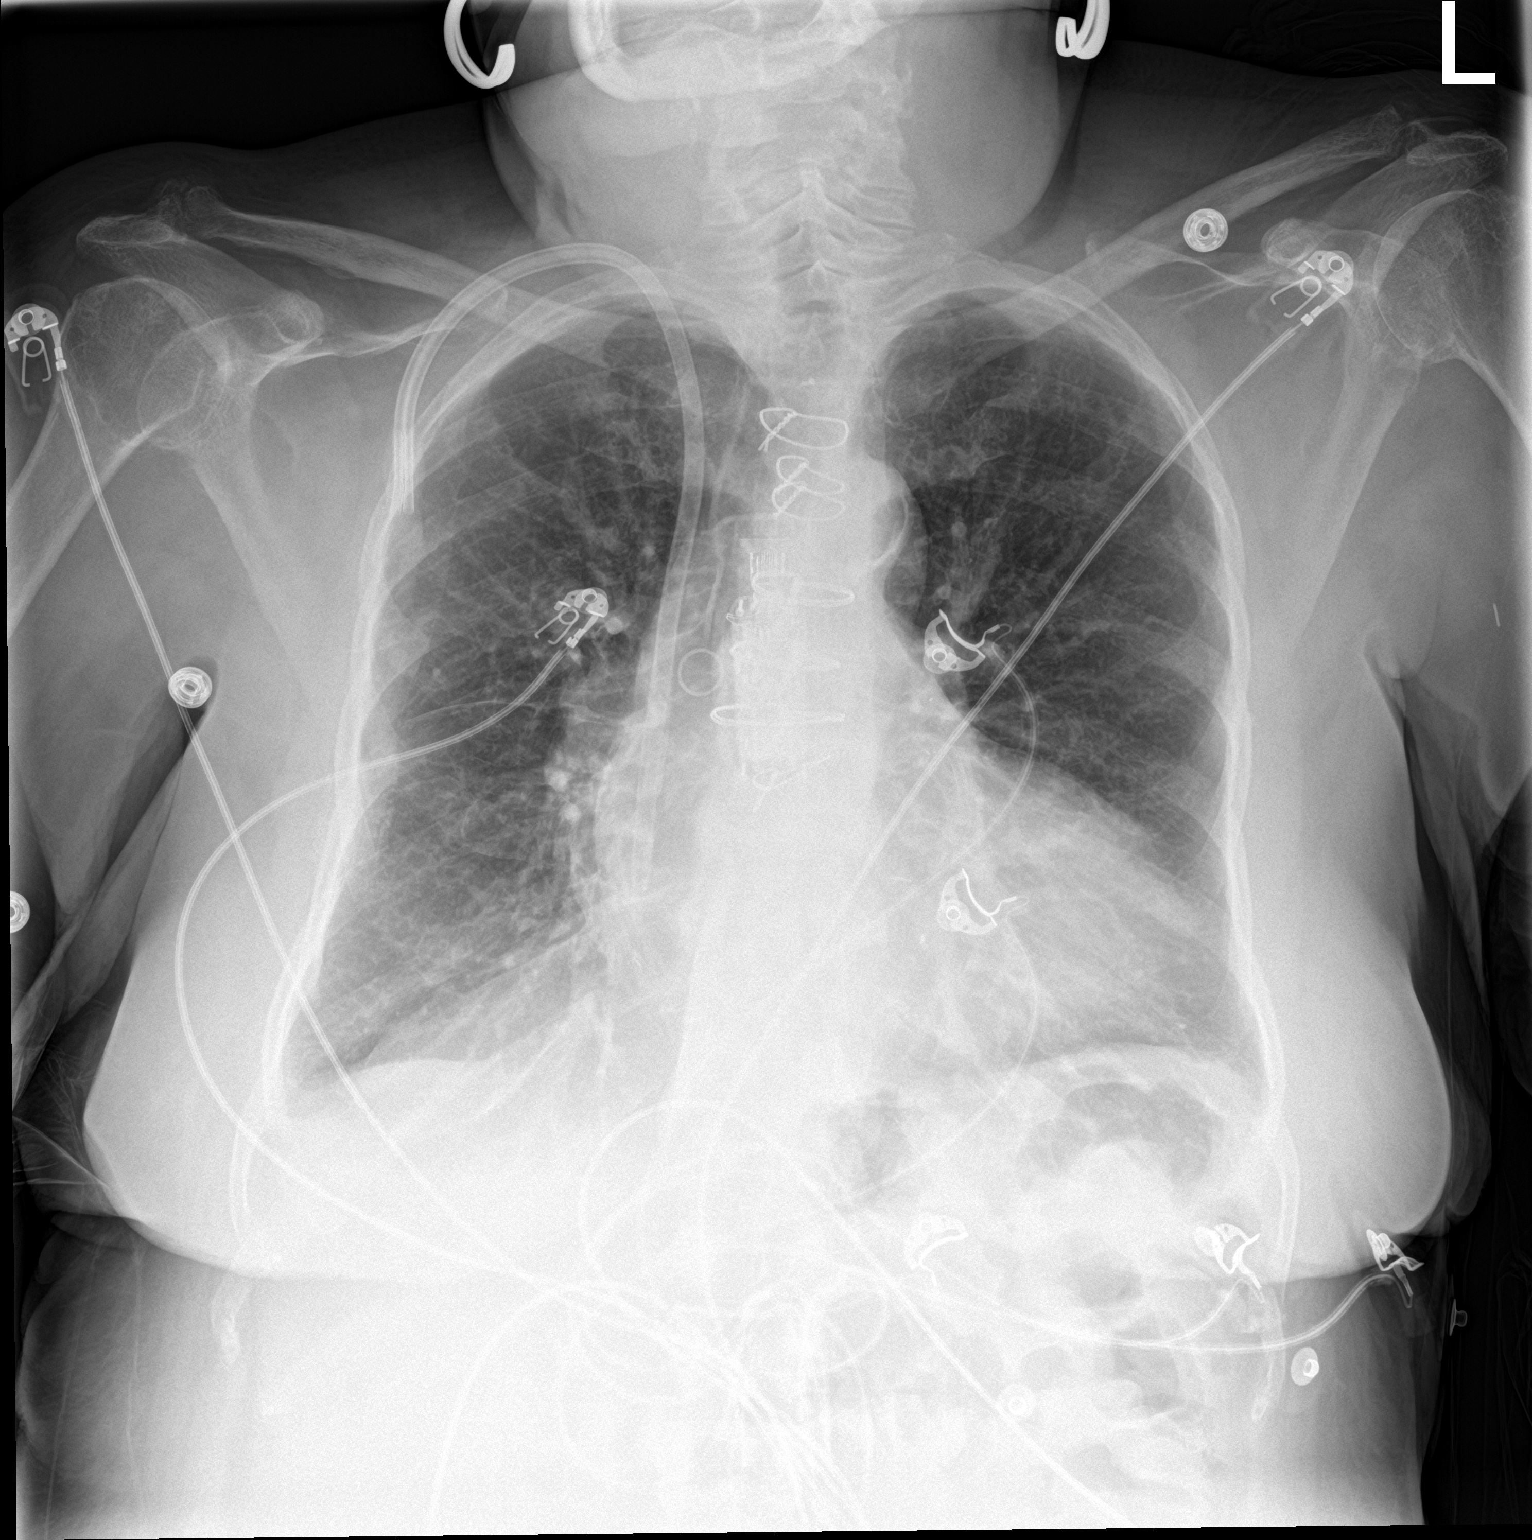

[chest lat]
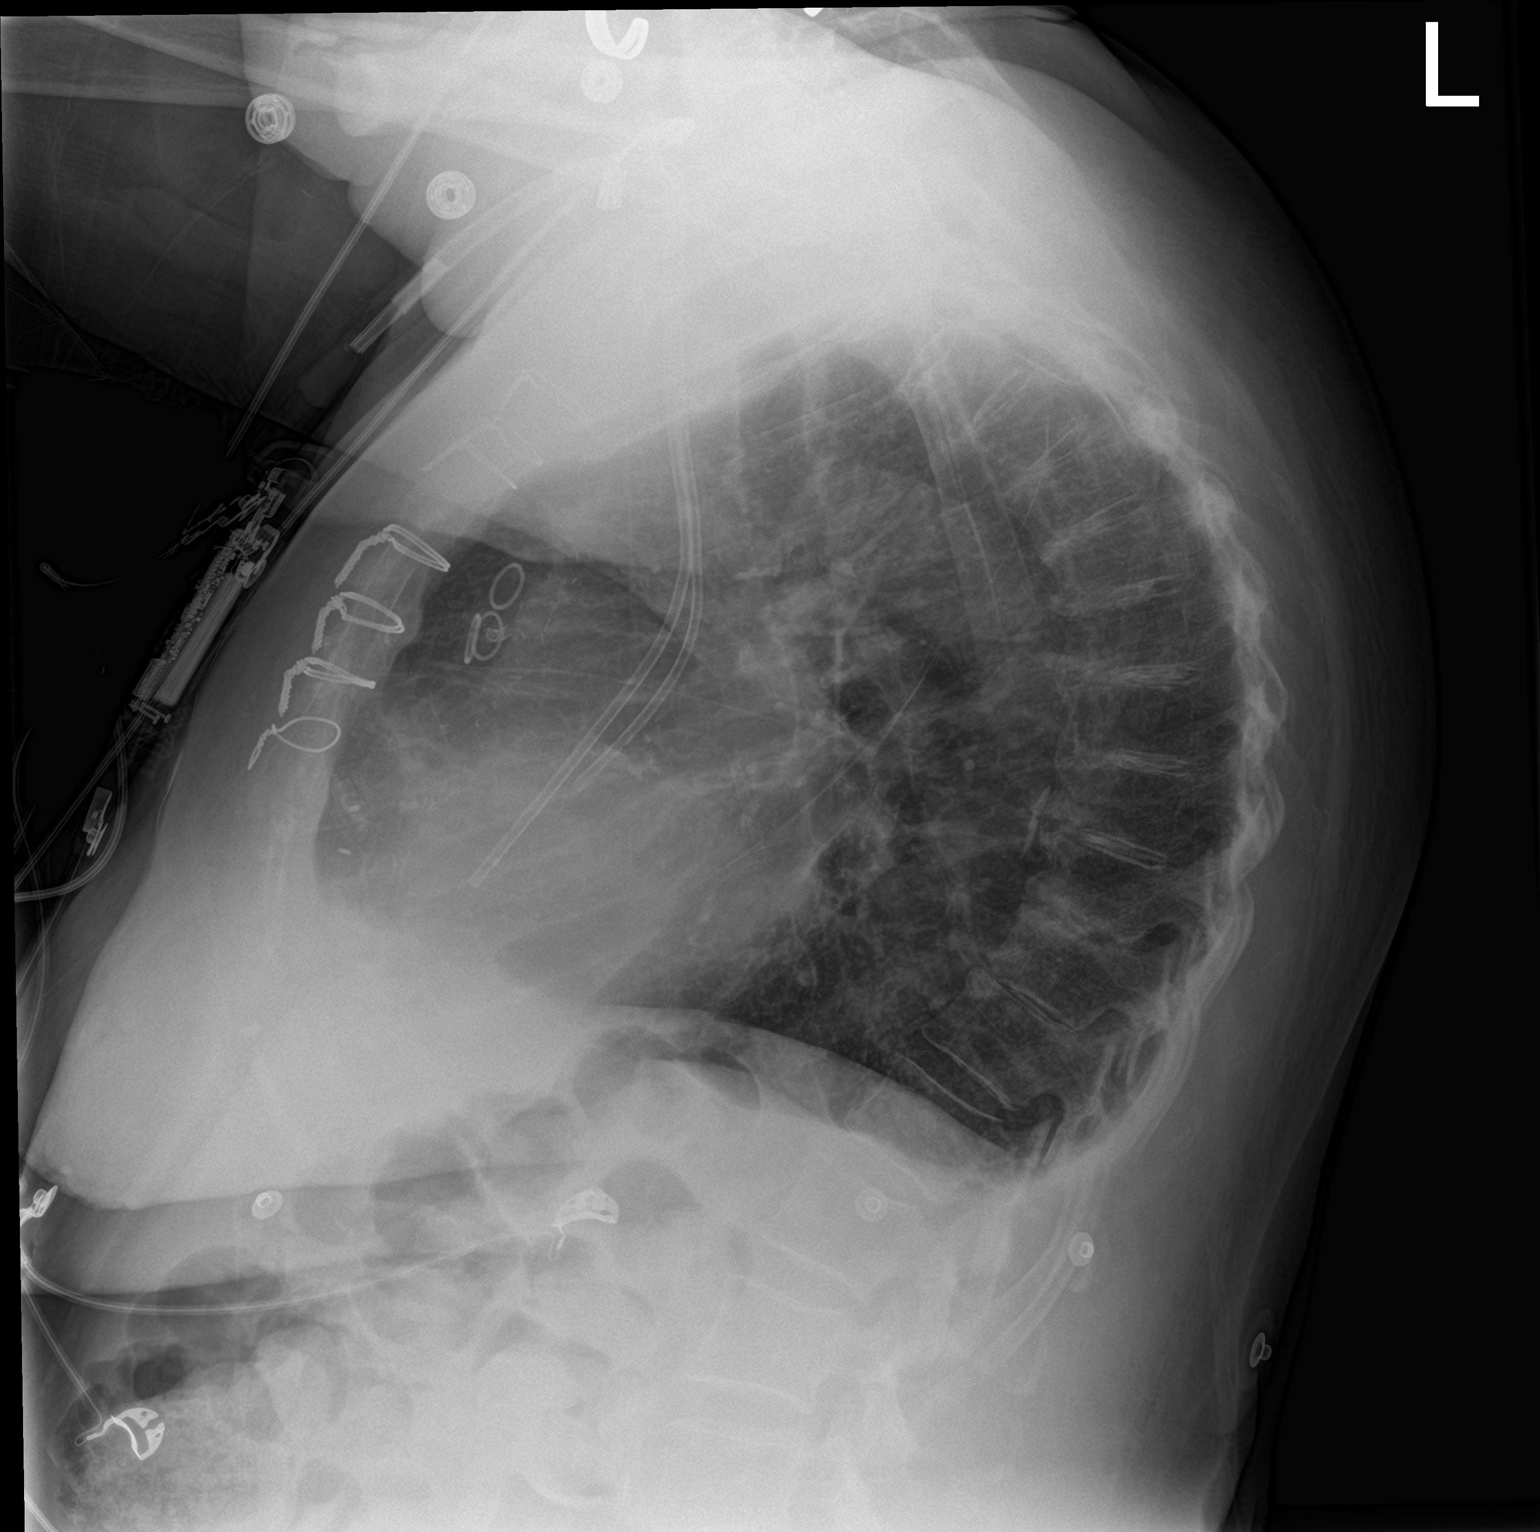

[2 of 2 positions shown; findings below may reference images not displayed]

FINDINGS: PA and lateral views. Stable right chest dialysis catheter. Stable
cardiac size and mediastinal contours. Prior CABG. No pneumothorax
or pulmonary edema. Small bilateral pleural effusions appear stable
since [REDACTED]. No other confluent pulmonary opacity.

Exaggerated thoracic kyphosis. Osteopenia. Negative visible bowel
gas pattern.
IMPRESSION: Stable small bilateral pleural effusions since [REDACTED]. No new
cardiopulmonary abnormality.
